# Patient Record
Sex: Female | Born: 1935 | Hispanic: Yes | Smoking: Never smoker
Health system: Southern US, Community
[De-identification: ages and names within clinical notes are randomized; demographics above are authoritative.]

## PROBLEM LIST (undated history)

## (undated) DIAGNOSIS — M67472 Ganglion, left ankle and foot: Secondary | ICD-10-CM

## (undated) DIAGNOSIS — N189 Chronic kidney disease, unspecified: Secondary | ICD-10-CM

## (undated) DIAGNOSIS — I509 Heart failure, unspecified: Secondary | ICD-10-CM

## (undated) DIAGNOSIS — F32A Depression, unspecified: Secondary | ICD-10-CM

## (undated) DIAGNOSIS — A4902 Methicillin resistant Staphylococcus aureus infection, unspecified site: Secondary | ICD-10-CM

## (undated) DIAGNOSIS — J45909 Unspecified asthma, uncomplicated: Secondary | ICD-10-CM

## (undated) DIAGNOSIS — I10 Essential (primary) hypertension: Secondary | ICD-10-CM

## (undated) DIAGNOSIS — F329 Major depressive disorder, single episode, unspecified: Secondary | ICD-10-CM

## (undated) DIAGNOSIS — M810 Age-related osteoporosis without current pathological fracture: Secondary | ICD-10-CM

## (undated) DIAGNOSIS — H9313 Tinnitus, bilateral: Secondary | ICD-10-CM

## (undated) DIAGNOSIS — M109 Gout, unspecified: Secondary | ICD-10-CM

## (undated) DIAGNOSIS — M1990 Unspecified osteoarthritis, unspecified site: Secondary | ICD-10-CM

## (undated) DIAGNOSIS — H9190 Unspecified hearing loss, unspecified ear: Secondary | ICD-10-CM

## (undated) DIAGNOSIS — E119 Type 2 diabetes mellitus without complications: Secondary | ICD-10-CM

## (undated) DIAGNOSIS — H9193 Unspecified hearing loss, bilateral: Secondary | ICD-10-CM

## (undated) HISTORY — DX: Heart failure, unspecified: I50.9

## (undated) HISTORY — DX: Gout, unspecified: M10.9

## (undated) HISTORY — PX: BACK SURGERY: SHX140

## (undated) HISTORY — DX: Methicillin resistant Staphylococcus aureus infection, unspecified site: A49.02

## (undated) HISTORY — DX: Unspecified asthma, uncomplicated: J45.909

## (undated) HISTORY — DX: Tinnitus, bilateral: H93.13

## (undated) HISTORY — DX: Unspecified hearing loss, bilateral: H91.93

## (undated) HISTORY — DX: Ganglion, left ankle and foot: M67.472

## (undated) HISTORY — PX: JOINT REPLACEMENT: SHX530

## (undated) HISTORY — DX: Unspecified hearing loss, unspecified ear: H91.90

## (undated) HISTORY — DX: Age-related osteoporosis without current pathological fracture: M81.0

## (undated) SURGERY — Surgical Case
Anesthesia: *Unknown

---

## 1997-08-18 ENCOUNTER — Other Ambulatory Visit: Admission: RE | Admit: 1997-08-18 | Discharge: 1997-08-18 | Payer: Self-pay | Admitting: Family Medicine

## 2000-01-01 ENCOUNTER — Encounter: Payer: Self-pay | Admitting: *Deleted

## 2000-01-01 ENCOUNTER — Ambulatory Visit (HOSPITAL_COMMUNITY): Admission: RE | Admit: 2000-01-01 | Discharge: 2000-01-01 | Payer: Self-pay | Admitting: *Deleted

## 2000-05-06 ENCOUNTER — Emergency Department (HOSPITAL_COMMUNITY): Admission: EM | Admit: 2000-05-06 | Discharge: 2000-05-07 | Payer: Self-pay | Admitting: Emergency Medicine

## 2000-05-07 ENCOUNTER — Encounter: Payer: Self-pay | Admitting: Internal Medicine

## 2000-05-07 ENCOUNTER — Inpatient Hospital Stay (HOSPITAL_COMMUNITY): Admission: EM | Admit: 2000-05-07 | Discharge: 2000-05-08 | Payer: Self-pay | Admitting: *Deleted

## 2000-06-09 ENCOUNTER — Encounter: Payer: Self-pay | Admitting: Family Medicine

## 2000-06-09 ENCOUNTER — Ambulatory Visit (HOSPITAL_COMMUNITY): Admission: RE | Admit: 2000-06-09 | Discharge: 2000-06-09 | Payer: Self-pay | Admitting: Family Medicine

## 2002-02-28 ENCOUNTER — Emergency Department (HOSPITAL_COMMUNITY): Admission: EM | Admit: 2002-02-28 | Discharge: 2002-03-01 | Payer: Self-pay | Admitting: Emergency Medicine

## 2002-02-28 ENCOUNTER — Encounter: Payer: Self-pay | Admitting: Emergency Medicine

## 2003-01-11 ENCOUNTER — Ambulatory Visit (HOSPITAL_COMMUNITY): Admission: RE | Admit: 2003-01-11 | Discharge: 2003-01-11 | Payer: Self-pay | Admitting: Internal Medicine

## 2003-07-20 ENCOUNTER — Ambulatory Visit (HOSPITAL_COMMUNITY): Admission: RE | Admit: 2003-07-20 | Discharge: 2003-07-20 | Payer: Self-pay | Admitting: Internal Medicine

## 2003-07-28 ENCOUNTER — Ambulatory Visit (HOSPITAL_COMMUNITY): Admission: RE | Admit: 2003-07-28 | Discharge: 2003-07-28 | Payer: Self-pay | Admitting: Internal Medicine

## 2003-11-22 ENCOUNTER — Ambulatory Visit: Payer: Self-pay | Admitting: *Deleted

## 2003-12-01 ENCOUNTER — Ambulatory Visit: Payer: Self-pay | Admitting: Family Medicine

## 2003-12-07 ENCOUNTER — Ambulatory Visit: Payer: Self-pay | Admitting: Family Medicine

## 2003-12-18 ENCOUNTER — Ambulatory Visit: Payer: Self-pay | Admitting: Family Medicine

## 2004-01-23 ENCOUNTER — Ambulatory Visit: Payer: Self-pay | Admitting: Family Medicine

## 2004-01-23 ENCOUNTER — Ambulatory Visit (HOSPITAL_COMMUNITY): Admission: RE | Admit: 2004-01-23 | Discharge: 2004-01-23 | Payer: Self-pay | Admitting: Family Medicine

## 2004-04-25 ENCOUNTER — Ambulatory Visit: Payer: Self-pay | Admitting: Family Medicine

## 2004-05-01 ENCOUNTER — Ambulatory Visit: Payer: Self-pay | Admitting: Family Medicine

## 2004-05-13 ENCOUNTER — Ambulatory Visit: Payer: Self-pay | Admitting: Family Medicine

## 2004-06-12 ENCOUNTER — Ambulatory Visit: Payer: Self-pay | Admitting: Family Medicine

## 2004-06-19 ENCOUNTER — Ambulatory Visit: Payer: Self-pay | Admitting: Family Medicine

## 2004-08-26 ENCOUNTER — Ambulatory Visit: Payer: Self-pay | Admitting: Internal Medicine

## 2004-11-15 ENCOUNTER — Ambulatory Visit: Payer: Self-pay | Admitting: Family Medicine

## 2004-12-19 ENCOUNTER — Ambulatory Visit: Payer: Self-pay | Admitting: Family Medicine

## 2004-12-30 ENCOUNTER — Ambulatory Visit: Payer: Self-pay | Admitting: Family Medicine

## 2005-01-10 ENCOUNTER — Ambulatory Visit: Payer: Self-pay | Admitting: Family Medicine

## 2005-06-30 ENCOUNTER — Ambulatory Visit: Payer: Self-pay | Admitting: Family Medicine

## 2005-07-01 ENCOUNTER — Ambulatory Visit: Payer: Self-pay | Admitting: Family Medicine

## 2005-07-02 ENCOUNTER — Ambulatory Visit (HOSPITAL_COMMUNITY): Admission: RE | Admit: 2005-07-02 | Discharge: 2005-07-02 | Payer: Self-pay | Admitting: Internal Medicine

## 2005-07-16 ENCOUNTER — Emergency Department (HOSPITAL_COMMUNITY): Admission: EM | Admit: 2005-07-16 | Discharge: 2005-07-16 | Payer: Self-pay | Admitting: Emergency Medicine

## 2005-07-17 ENCOUNTER — Ambulatory Visit: Payer: Self-pay | Admitting: Family Medicine

## 2005-07-28 ENCOUNTER — Ambulatory Visit (HOSPITAL_BASED_OUTPATIENT_CLINIC_OR_DEPARTMENT_OTHER): Admission: RE | Admit: 2005-07-28 | Discharge: 2005-07-28 | Payer: Self-pay | Admitting: Urology

## 2005-08-04 ENCOUNTER — Ambulatory Visit (HOSPITAL_COMMUNITY): Admission: RE | Admit: 2005-08-04 | Discharge: 2005-08-04 | Payer: Self-pay | Admitting: Urology

## 2005-08-11 ENCOUNTER — Ambulatory Visit (HOSPITAL_COMMUNITY): Admission: RE | Admit: 2005-08-11 | Discharge: 2005-08-11 | Payer: Self-pay | Admitting: Urology

## 2005-09-01 ENCOUNTER — Ambulatory Visit (HOSPITAL_BASED_OUTPATIENT_CLINIC_OR_DEPARTMENT_OTHER): Admission: RE | Admit: 2005-09-01 | Discharge: 2005-09-01 | Payer: Self-pay | Admitting: Urology

## 2005-09-16 ENCOUNTER — Ambulatory Visit (HOSPITAL_COMMUNITY): Admission: RE | Admit: 2005-09-16 | Discharge: 2005-09-16 | Payer: Self-pay | Admitting: Urology

## 2005-10-23 ENCOUNTER — Ambulatory Visit: Payer: Self-pay | Admitting: Family Medicine

## 2005-10-24 ENCOUNTER — Ambulatory Visit: Payer: Self-pay | Admitting: *Deleted

## 2005-11-19 ENCOUNTER — Ambulatory Visit: Payer: Self-pay | Admitting: Family Medicine

## 2005-12-11 ENCOUNTER — Ambulatory Visit: Payer: Self-pay | Admitting: Family Medicine

## 2006-01-20 ENCOUNTER — Ambulatory Visit: Payer: Self-pay | Admitting: Family Medicine

## 2006-03-03 ENCOUNTER — Ambulatory Visit: Payer: Self-pay | Admitting: Family Medicine

## 2006-04-22 ENCOUNTER — Ambulatory Visit: Payer: Self-pay | Admitting: Family Medicine

## 2006-05-19 ENCOUNTER — Ambulatory Visit: Payer: Self-pay | Admitting: Family Medicine

## 2006-05-19 ENCOUNTER — Encounter (INDEPENDENT_AMBULATORY_CARE_PROVIDER_SITE_OTHER): Payer: Self-pay | Admitting: Family Medicine

## 2006-07-02 ENCOUNTER — Ambulatory Visit (HOSPITAL_COMMUNITY): Admission: RE | Admit: 2006-07-02 | Discharge: 2006-07-02 | Payer: Self-pay | Admitting: Urology

## 2006-08-04 ENCOUNTER — Ambulatory Visit (HOSPITAL_COMMUNITY): Admission: RE | Admit: 2006-08-04 | Discharge: 2006-08-04 | Payer: Self-pay | Admitting: Urology

## 2006-08-17 ENCOUNTER — Encounter (INDEPENDENT_AMBULATORY_CARE_PROVIDER_SITE_OTHER): Payer: Self-pay | Admitting: Family Medicine

## 2006-08-17 DIAGNOSIS — K5730 Diverticulosis of large intestine without perforation or abscess without bleeding: Secondary | ICD-10-CM | POA: Insufficient documentation

## 2006-08-17 DIAGNOSIS — E1122 Type 2 diabetes mellitus with diabetic chronic kidney disease: Secondary | ICD-10-CM

## 2006-08-17 DIAGNOSIS — I10 Essential (primary) hypertension: Secondary | ICD-10-CM | POA: Insufficient documentation

## 2006-09-17 ENCOUNTER — Encounter (INDEPENDENT_AMBULATORY_CARE_PROVIDER_SITE_OTHER): Payer: Self-pay | Admitting: Nurse Practitioner

## 2006-09-17 ENCOUNTER — Ambulatory Visit: Payer: Self-pay | Admitting: Internal Medicine

## 2006-11-11 ENCOUNTER — Encounter (INDEPENDENT_AMBULATORY_CARE_PROVIDER_SITE_OTHER): Payer: Self-pay | Admitting: *Deleted

## 2007-04-27 ENCOUNTER — Ambulatory Visit: Payer: Self-pay | Admitting: Internal Medicine

## 2007-04-27 ENCOUNTER — Encounter (INDEPENDENT_AMBULATORY_CARE_PROVIDER_SITE_OTHER): Payer: Self-pay | Admitting: Family Medicine

## 2007-04-27 LAB — CONVERTED CEMR LAB
Alkaline Phosphatase: 80 units/L (ref 39–117)
Basophils Absolute: 0.1 10*3/uL (ref 0.0–0.1)
CO2: 24 meq/L (ref 19–32)
Cholesterol: 172 mg/dL (ref 0–200)
Creatinine, Ser: 0.86 mg/dL (ref 0.40–1.20)
Eosinophils Absolute: 0.1 10*3/uL (ref 0.0–0.7)
Eosinophils Relative: 2 % (ref 0–5)
Glucose, Bld: 155 mg/dL — ABNORMAL HIGH (ref 70–99)
HCT: 42 % (ref 36.0–46.0)
Hemoglobin: 13 g/dL (ref 12.0–15.0)
LDL Cholesterol: 53 mg/dL (ref 0–99)
Lymphs Abs: 2.1 10*3/uL (ref 0.7–4.0)
MCV: 88.6 fL (ref 78.0–100.0)
Monocytes Absolute: 0.5 10*3/uL (ref 0.1–1.0)
Platelets: 216 10*3/uL (ref 150–400)
RDW: 15.9 % — ABNORMAL HIGH (ref 11.5–15.5)
Sodium: 141 meq/L (ref 135–145)
Total Bilirubin: 0.3 mg/dL (ref 0.3–1.2)
Total CHOL/HDL Ratio: 3.8
Total Protein: 7.4 g/dL (ref 6.0–8.3)
Triglycerides: 368 mg/dL — ABNORMAL HIGH (ref <150)
VLDL: 74 mg/dL — ABNORMAL HIGH (ref 0–40)

## 2007-05-25 ENCOUNTER — Inpatient Hospital Stay (HOSPITAL_COMMUNITY): Admission: RE | Admit: 2007-05-25 | Discharge: 2007-05-29 | Payer: Self-pay | Admitting: Orthopaedic Surgery

## 2007-08-16 ENCOUNTER — Ambulatory Visit: Payer: Self-pay | Admitting: Family Medicine

## 2007-12-08 ENCOUNTER — Ambulatory Visit (HOSPITAL_COMMUNITY): Admission: RE | Admit: 2007-12-08 | Discharge: 2007-12-08 | Payer: Self-pay | Admitting: Urology

## 2007-12-14 ENCOUNTER — Ambulatory Visit: Payer: Self-pay | Admitting: Internal Medicine

## 2007-12-14 ENCOUNTER — Encounter (INDEPENDENT_AMBULATORY_CARE_PROVIDER_SITE_OTHER): Payer: Self-pay | Admitting: Adult Health

## 2007-12-14 LAB — CONVERTED CEMR LAB
ALT: 11 units/L (ref 0–35)
AST: 15 units/L (ref 0–37)
Albumin: 4 g/dL (ref 3.5–5.2)
Basophils Absolute: 0 10*3/uL (ref 0.0–0.1)
Basophils Relative: 1 % (ref 0–1)
CO2: 21 meq/L (ref 19–32)
Calcium: 9 mg/dL (ref 8.4–10.5)
Chloride: 106 meq/L (ref 96–112)
Cholesterol: 144 mg/dL (ref 0–200)
Creatinine, Ser: 0.89 mg/dL (ref 0.40–1.20)
Hemoglobin: 11.9 g/dL — ABNORMAL LOW (ref 12.0–15.0)
Lymphocytes Relative: 29 % (ref 12–46)
MCHC: 30.5 g/dL (ref 30.0–36.0)
Microalb, Ur: 1.96 mg/dL — ABNORMAL HIGH (ref 0.00–1.89)
Monocytes Absolute: 0.4 10*3/uL (ref 0.1–1.0)
Neutro Abs: 3.1 10*3/uL (ref 1.7–7.7)
Neutrophils Relative %: 60 % (ref 43–77)
Platelets: 220 10*3/uL (ref 150–400)
Potassium: 4.2 meq/L (ref 3.5–5.3)
RDW: 15.3 % (ref 11.5–15.5)
Sodium: 140 meq/L (ref 135–145)
TSH: 0.845 microintl units/mL (ref 0.350–4.50)
Total CHOL/HDL Ratio: 3
Total Protein: 7.2 g/dL (ref 6.0–8.3)

## 2007-12-15 ENCOUNTER — Encounter (INDEPENDENT_AMBULATORY_CARE_PROVIDER_SITE_OTHER): Payer: Self-pay | Admitting: Adult Health

## 2007-12-17 ENCOUNTER — Ambulatory Visit: Payer: Self-pay | Admitting: Internal Medicine

## 2007-12-29 ENCOUNTER — Ambulatory Visit: Payer: Self-pay | Admitting: Internal Medicine

## 2007-12-29 ENCOUNTER — Encounter (INDEPENDENT_AMBULATORY_CARE_PROVIDER_SITE_OTHER): Payer: Self-pay | Admitting: Adult Health

## 2007-12-31 ENCOUNTER — Ambulatory Visit (HOSPITAL_COMMUNITY): Admission: RE | Admit: 2007-12-31 | Discharge: 2007-12-31 | Payer: Self-pay | Admitting: Family Medicine

## 2008-05-02 ENCOUNTER — Ambulatory Visit: Payer: Self-pay | Admitting: Internal Medicine

## 2008-07-27 ENCOUNTER — Ambulatory Visit: Payer: Self-pay | Admitting: Internal Medicine

## 2008-09-26 ENCOUNTER — Encounter (INDEPENDENT_AMBULATORY_CARE_PROVIDER_SITE_OTHER): Payer: Self-pay | Admitting: Adult Health

## 2008-09-26 ENCOUNTER — Ambulatory Visit (HOSPITAL_COMMUNITY): Admission: RE | Admit: 2008-09-26 | Discharge: 2008-09-26 | Payer: Self-pay | Admitting: Internal Medicine

## 2008-09-26 ENCOUNTER — Ambulatory Visit: Payer: Self-pay | Admitting: Internal Medicine

## 2008-09-26 LAB — CONVERTED CEMR LAB: Microalb, Ur: 1.5 mg/dL (ref 0.00–1.89)

## 2008-09-27 ENCOUNTER — Encounter (INDEPENDENT_AMBULATORY_CARE_PROVIDER_SITE_OTHER): Payer: Self-pay | Admitting: Adult Health

## 2008-10-09 ENCOUNTER — Ambulatory Visit (HOSPITAL_COMMUNITY): Admission: RE | Admit: 2008-10-09 | Discharge: 2008-10-09 | Payer: Self-pay | Admitting: Internal Medicine

## 2008-10-24 ENCOUNTER — Ambulatory Visit: Payer: Self-pay | Admitting: Internal Medicine

## 2008-11-23 ENCOUNTER — Ambulatory Visit: Payer: Self-pay | Admitting: Internal Medicine

## 2008-11-23 ENCOUNTER — Encounter (INDEPENDENT_AMBULATORY_CARE_PROVIDER_SITE_OTHER): Payer: Self-pay | Admitting: Adult Health

## 2008-11-23 LAB — CONVERTED CEMR LAB
BUN: 13 mg/dL (ref 6–23)
Chloride: 107 meq/L (ref 96–112)
Creatinine, Ser: 0.96 mg/dL (ref 0.40–1.20)
Potassium: 4.7 meq/L (ref 3.5–5.3)

## 2008-12-25 ENCOUNTER — Encounter (INDEPENDENT_AMBULATORY_CARE_PROVIDER_SITE_OTHER): Payer: Self-pay | Admitting: Adult Health

## 2008-12-25 ENCOUNTER — Ambulatory Visit: Payer: Self-pay | Admitting: Internal Medicine

## 2008-12-25 LAB — CONVERTED CEMR LAB
Chloride: 105 meq/L (ref 96–112)
Glucose, Bld: 120 mg/dL — ABNORMAL HIGH (ref 70–99)
Microalb, Ur: 0.98 mg/dL (ref 0.00–1.89)
Potassium: 4.9 meq/L (ref 3.5–5.3)
Sodium: 136 meq/L (ref 135–145)

## 2008-12-28 ENCOUNTER — Ambulatory Visit: Payer: Self-pay | Admitting: Internal Medicine

## 2009-01-01 ENCOUNTER — Ambulatory Visit: Payer: Self-pay | Admitting: Internal Medicine

## 2009-01-01 ENCOUNTER — Ambulatory Visit (HOSPITAL_COMMUNITY): Admission: RE | Admit: 2009-01-01 | Discharge: 2009-01-01 | Payer: Self-pay | Admitting: Obstetrics & Gynecology

## 2009-04-11 ENCOUNTER — Ambulatory Visit: Payer: Self-pay | Admitting: Internal Medicine

## 2009-04-26 ENCOUNTER — Encounter (INDEPENDENT_AMBULATORY_CARE_PROVIDER_SITE_OTHER): Payer: Self-pay | Admitting: Adult Health

## 2009-04-26 ENCOUNTER — Ambulatory Visit: Payer: Self-pay | Admitting: Internal Medicine

## 2009-04-26 LAB — CONVERTED CEMR LAB
ALT: 16 units/L (ref 0–35)
CO2: 19 meq/L (ref 19–32)
Calcium: 9 mg/dL (ref 8.4–10.5)
Chloride: 101 meq/L (ref 96–112)
Cholesterol: 186 mg/dL (ref 0–200)
Glucose, Bld: 142 mg/dL — ABNORMAL HIGH (ref 70–99)
Sodium: 132 meq/L — ABNORMAL LOW (ref 135–145)
Total Protein: 7.2 g/dL (ref 6.0–8.3)
Triglycerides: 250 mg/dL — ABNORMAL HIGH (ref <150)

## 2009-05-22 ENCOUNTER — Inpatient Hospital Stay (HOSPITAL_COMMUNITY): Admission: RE | Admit: 2009-05-22 | Discharge: 2009-05-25 | Payer: Self-pay | Admitting: Orthopaedic Surgery

## 2009-07-09 ENCOUNTER — Ambulatory Visit: Payer: Self-pay | Admitting: Internal Medicine

## 2009-07-31 ENCOUNTER — Ambulatory Visit: Payer: Self-pay | Admitting: Internal Medicine

## 2009-08-01 ENCOUNTER — Encounter (INDEPENDENT_AMBULATORY_CARE_PROVIDER_SITE_OTHER): Payer: Self-pay | Admitting: Adult Health

## 2009-08-28 ENCOUNTER — Ambulatory Visit: Payer: Self-pay | Admitting: Internal Medicine

## 2009-08-29 ENCOUNTER — Ambulatory Visit: Payer: Self-pay | Admitting: Family Medicine

## 2009-08-29 ENCOUNTER — Encounter (INDEPENDENT_AMBULATORY_CARE_PROVIDER_SITE_OTHER): Payer: Self-pay | Admitting: Adult Health

## 2009-08-29 LAB — CONVERTED CEMR LAB
Albumin: 4.2 g/dL (ref 3.5–5.2)
BUN: 17 mg/dL (ref 6–23)
CO2: 24 meq/L (ref 19–32)
Calcium: 9.4 mg/dL (ref 8.4–10.5)
Chloride: 99 meq/L (ref 96–112)
Cholesterol: 125 mg/dL (ref 0–200)
Glucose, Bld: 156 mg/dL — ABNORMAL HIGH (ref 70–99)
HDL: 52 mg/dL (ref >39)
Potassium: 4.9 meq/L (ref 3.5–5.3)
Triglycerides: 108 mg/dL (ref <150)

## 2009-12-24 ENCOUNTER — Encounter (INDEPENDENT_AMBULATORY_CARE_PROVIDER_SITE_OTHER): Payer: Self-pay | Admitting: *Deleted

## 2009-12-24 LAB — CONVERTED CEMR LAB: Microalb, Ur: 0.58 mg/dL (ref 0.00–1.89)

## 2010-01-02 ENCOUNTER — Ambulatory Visit (HOSPITAL_COMMUNITY): Admission: RE | Admit: 2010-01-02 | Discharge: 2010-01-02 | Payer: Self-pay | Admitting: Internal Medicine

## 2010-03-16 ENCOUNTER — Encounter: Payer: Self-pay | Admitting: Internal Medicine

## 2010-05-06 ENCOUNTER — Other Ambulatory Visit (HOSPITAL_COMMUNITY): Payer: Self-pay | Admitting: Family Medicine

## 2010-05-06 ENCOUNTER — Ambulatory Visit (HOSPITAL_COMMUNITY)
Admission: RE | Admit: 2010-05-06 | Discharge: 2010-05-06 | Disposition: A | Discharge status: Home / Self Care | Payer: Self-pay | Source: Ambulatory Visit | Attending: Family Medicine | Admitting: Family Medicine

## 2010-05-06 ENCOUNTER — Encounter (INDEPENDENT_AMBULATORY_CARE_PROVIDER_SITE_OTHER): Payer: Self-pay | Admitting: *Deleted

## 2010-05-06 DIAGNOSIS — R52 Pain, unspecified: Secondary | ICD-10-CM

## 2010-05-06 DIAGNOSIS — M899 Disorder of bone, unspecified: Secondary | ICD-10-CM | POA: Insufficient documentation

## 2010-05-06 DIAGNOSIS — M25579 Pain in unspecified ankle and joints of unspecified foot: Secondary | ICD-10-CM | POA: Insufficient documentation

## 2010-05-06 LAB — CONVERTED CEMR LAB
Albumin: 4.2 g/dL (ref 3.5–5.2)
Alkaline Phosphatase: 88 units/L (ref 39–117)
BUN: 19 mg/dL (ref 6–23)
Calcium: 9.7 mg/dL (ref 8.4–10.5)
Chloride: 103 meq/L (ref 96–112)
Glucose, Bld: 223 mg/dL — ABNORMAL HIGH (ref 70–99)
LDL Cholesterol: 50 mg/dL (ref 0–99)
Potassium: 5.5 meq/L — ABNORMAL HIGH (ref 3.5–5.3)
Sodium: 137 meq/L (ref 135–145)
Total Protein: 7.1 g/dL (ref 6.0–8.3)
Triglycerides: 149 mg/dL (ref <150)

## 2010-05-15 LAB — GLUCOSE, CAPILLARY
Glucose-Capillary: 158 mg/dL — ABNORMAL HIGH (ref 70–99)
Glucose-Capillary: 162 mg/dL — ABNORMAL HIGH (ref 70–99)

## 2010-05-15 LAB — CBC
HCT: 31.2 % — ABNORMAL LOW (ref 36.0–46.0)
MCHC: 34.5 g/dL (ref 30.0–36.0)
MCV: 87.9 fL (ref 78.0–100.0)
RBC: 3.55 MIL/uL — ABNORMAL LOW (ref 3.87–5.11)
WBC: 8.4 10*3/uL (ref 4.0–10.5)

## 2010-05-15 LAB — BASIC METABOLIC PANEL
CO2: 25 mEq/L (ref 19–32)
Chloride: 102 mEq/L (ref 96–112)
GFR calc Af Amer: >60 mL/min (ref >60)
Potassium: 4.3 mEq/L (ref 3.5–5.1)

## 2010-05-17 LAB — CROSSMATCH
ABO/RH(D): O NEG
Antibody Screen: NEGATIVE

## 2010-05-17 LAB — PROTIME-INR: INR: 1.07 (ref 0.00–1.49)

## 2010-05-17 LAB — CBC
HCT: 27.3 % — ABNORMAL LOW (ref 36.0–46.0)
HCT: 31 % — ABNORMAL LOW (ref 36.0–46.0)
HCT: 35.9 % — ABNORMAL LOW (ref 36.0–46.0)
Hemoglobin: 10.6 g/dL — ABNORMAL LOW (ref 12.0–15.0)
MCHC: 34 g/dL (ref 30.0–36.0)
MCV: 86.2 fL (ref 78.0–100.0)
MCV: 87.9 fL (ref 78.0–100.0)
Platelets: 236 10*3/uL (ref 150–400)
Platelets: 241 10*3/uL (ref 150–400)
RBC: 3.53 MIL/uL — ABNORMAL LOW (ref 3.87–5.11)
RDW: 14.4 % (ref 11.5–15.5)
WBC: 7.7 10*3/uL (ref 4.0–10.5)
WBC: 8.7 10*3/uL (ref 4.0–10.5)
WBC: 9.4 10*3/uL (ref 4.0–10.5)

## 2010-05-17 LAB — URINE MICROSCOPIC-ADD ON

## 2010-05-17 LAB — BASIC METABOLIC PANEL
BUN: 16 mg/dL (ref 6–23)
BUN: 17 mg/dL (ref 6–23)
BUN: 17 mg/dL (ref 6–23)
CO2: 22 mEq/L (ref 19–32)
CO2: 24 mEq/L (ref 19–32)
Chloride: 96 mEq/L (ref 96–112)
Chloride: 97 mEq/L (ref 96–112)
Chloride: 99 mEq/L (ref 96–112)
Creatinine, Ser: 1.87 mg/dL — ABNORMAL HIGH (ref 0.4–1.2)
GFR calc Af Amer: 32 mL/min — ABNORMAL LOW (ref >60)
GFR calc Af Amer: >60 mL/min (ref >60)
GFR calc non Af Amer: 42 mL/min — ABNORMAL LOW (ref >60)
GFR calc non Af Amer: 55 mL/min — ABNORMAL LOW (ref >60)
Glucose, Bld: 150 mg/dL — ABNORMAL HIGH (ref 70–99)
Glucose, Bld: 242 mg/dL — ABNORMAL HIGH (ref 70–99)
Potassium: 4.2 mEq/L (ref 3.5–5.1)
Potassium: 4.4 mEq/L (ref 3.5–5.1)
Potassium: 4.5 mEq/L (ref 3.5–5.1)
Potassium: 4.9 mEq/L (ref 3.5–5.1)
Sodium: 126 mEq/L — ABNORMAL LOW (ref 135–145)
Sodium: 126 mEq/L — ABNORMAL LOW (ref 135–145)

## 2010-05-17 LAB — URINALYSIS, ROUTINE W REFLEX MICROSCOPIC
Bilirubin Urine: NEGATIVE
Hgb urine dipstick: NEGATIVE
Specific Gravity, Urine: 1.009 (ref 1.005–1.030)
pH: 6 (ref 5.0–8.0)

## 2010-05-17 LAB — HEPATIC FUNCTION PANEL
ALT: 13 U/L (ref 0–35)
AST: 22 U/L (ref 0–37)
Bilirubin, Direct: <0.1 mg/dL (ref 0.0–0.3)
Total Protein: 7.4 g/dL (ref 6.0–8.3)

## 2010-05-17 LAB — GLUCOSE, CAPILLARY
Glucose-Capillary: 165 mg/dL — ABNORMAL HIGH (ref 70–99)
Glucose-Capillary: 179 mg/dL — ABNORMAL HIGH (ref 70–99)
Glucose-Capillary: 200 mg/dL — ABNORMAL HIGH (ref 70–99)

## 2010-05-17 LAB — ABO/RH: ABO/RH(D): O NEG

## 2010-05-24 ENCOUNTER — Encounter (INDEPENDENT_AMBULATORY_CARE_PROVIDER_SITE_OTHER): Payer: Self-pay | Admitting: *Deleted

## 2010-05-24 LAB — CONVERTED CEMR LAB
CO2: 19 meq/L (ref 19–32)
Calcium: 9.4 mg/dL (ref 8.4–10.5)
Glucose, Bld: 53 mg/dL — ABNORMAL LOW (ref 70–99)
Potassium: 5.2 meq/L (ref 3.5–5.3)
Sodium: 133 meq/L — ABNORMAL LOW (ref 135–145)

## 2010-07-06 ENCOUNTER — Inpatient Hospital Stay (INDEPENDENT_AMBULATORY_CARE_PROVIDER_SITE_OTHER)
Admission: RE | Admit: 2010-07-06 | Discharge: 2010-07-06 | Disposition: A | Discharge status: Home / Self Care | Payer: Self-pay | Source: Ambulatory Visit | Attending: Emergency Medicine | Admitting: Emergency Medicine

## 2010-07-06 ENCOUNTER — Ambulatory Visit (INDEPENDENT_AMBULATORY_CARE_PROVIDER_SITE_OTHER): Payer: Self-pay

## 2010-07-06 DIAGNOSIS — M25559 Pain in unspecified hip: Secondary | ICD-10-CM

## 2010-07-09 NOTE — Op Note (Signed)
NAME:  Sumner Boast      ACCOUNT NO.:  [id number]   MEDICAL RECORD NO.:  [id number]          PATIENT TYPE:  INP   LOCATION:  2899                         FACILITY:  [facility]   PHYSICIAN:  Vanita Panda. Magnus Ivan, M.D.DATE OF BIRTH:  Aug 25, 1934   DATE OF PROCEDURE:  05/25/2007  DATE OF DISCHARGE:                               OPERATIVE REPORT   PREOPERATIVE DIAGNOSIS:  Right knee severe osteoarthritis/degenerative  joint disease.   POSTOPERATIVE DIAGNOSIS:  Right knee severe osteoarthritis/degenerative  joint disease.   PROCEDURE:  Right total knee arthroplasty using computer navigation.   IMPLANTS:  DePuy rotating platform [employer] total knee  replacement with size 2 femoral component, size 2 tibial tray, size 12.5  polyethylene insert, 32 mm polyethylene patellar button.   SURGEON:  Vanita Panda. Magnus Ivan, M.D.   ASSISTANT:  Wende Neighbors, P.A.-C.   ANESTHESIA:  1. Right leg femoral nerve block.  2. General.   ANTIBIOTICS:  1 gram IV Ancef.   TOURNIQUET TIME:  1 hour 30 minutes.   ESTIMATED BLOOD LOSS:  150 mL.   COMPLICATIONS:  None.   INDICATIONS:  Briefly, Ms. [name] is a 76 year old female who I have  been following for over a year with severe pain in both her knees as  well as her lower back.  We have had well documented radiograph evidence  of tricompartmental arthritis involving both knees and her right knee  has been much more painful to her.  We have tried injections in her  knees and anti-inflammatories and it has gotten to the point where it is  greatly affecting her activities of daily living and she is wishing to  proceed to total knee arthroplasty due to the amount of pain she is  having on a daily basis.  The risks and benefits of this have been  explained to her at length through interpreters and through her family  and they agreed to proceed with surgery.  I have shown them models of  a  total knee replacement, explained the  risks and benefits including the  risks of DVT and fatal pulmonary embolus.  They wish to proceed with  surgery.   DESCRIPTION OF PROCEDURE:  After informed consent was obtained, the  right leg was marked and a femoral block was obtained by anesthesia.  She was then brought to the operating room and placed supine on the  operating table.  General anesthesia was then obtained. A non-sterile  tourniquet was placed on her upper right thigh, a Foley catheter was  inserted, as well.  Her leg was prepped and draped with DuraPrep and  sterile drapes including a sterile stockinette.  A time out was called  and she was identified as the correct patient and the correct extremity.  Then, an Esmarch was used to wrap out her the leg and the tourniquet was  inflated to 300 mmHg.  I then made a midline incision directly over the  patella and carried this proximally and distally down to the level of  the tibial tubercle and proximal.  I then took a medial parapatellar  arthrotomy.  A large effusion was encountered.  I  dissected down to the  knee joint.  I then was able to invert the patella and brought the knee  up to the flexed position.  Osteophytes were removed using a rongeur on  both the tibia, the patella, and the femur.  I then released the ACL as  well as the PCL and removed meniscal debris from the medial and lateral  compartments.  I next proceeded with the navigation portion of the case.  Just distal to my incision, two small stab incisions were made in the  tibia and I placed Steinmann pins from anterior medial to posterior  lateral and placed navigation arrays for the navigation portion of the  case.  Two similar pins were placed in the femur proximally from  anterior medial to posterior lateral but through my arthrotomy incision.  I then used the [facility] computer system to help navigate the knee.  First, I then was able to make my tibial cut with taking 10 mm off the  high side and only  4-5 mm off the low side and she had a significant  varus deformity of her knee with a measured angulation of between 11 and  14 degrees.  Once the tibia cut was made, this was verified under  computer navigation and this was within accordance of our plan for  balancing.  I then balanced the knee in extension and brought it to a  flexed position and this maintained her balance, as well.  I then made  my distal femoral cut using the computer navigation guide and then  balanced this again in flexion and extension. Once I felt this was  adequate and released more tissue from the medial side, this started  coming to a more balanced knee.  I then made the chamfer cuts using the  soft tissue guide as well as cutting off the rotation.  This all, again,  was verified with the computer navigation.  I then chose a size 2  femoral component that was preselected, as well, and sized the tibial  tray and prepared my box cut and the tibial cut for the finishing cuts.  I then placed a size 10 mm insert and put the knee through a range of  motion, a 12.5 mm trial insert, I felt the 12.5 mm insert gave her more  stability with giving her full extension, as well, I could flex her  easily to 120 degrees.  I next measured the patella, only 18 mm, so I  took 5 mm off my patellar cut, and drilled for a size 32 patellar  button.  All trial components were then removed.  I copiously irrigated  the knee using normal saline solution and pulsatile lavage. The cement  was then mixed and I cemented the real size 2 rotating platform tibial  tray followed by the size 2 femur.  The 12.5 mm polyethylene insert was  placed and the patellar button was also secured which was a size 32.  Once the cement was dry, I put the knee through a range of motion and,  again, it was felt to be stable with full extension to 100 degrees of  flexion.  She is a very thin individual.  I then copiously irrigated the  knee, again, after the  tourniquet was let down.  Hemostasis was  obtained.  There was enough of a dry knee that I felt it was not  necessary to place a drain at that point.  I then closed the arthrotomy  with interrupted #1 Vicryl suture followed by 2-0 Vicryl suture to bring  the skin together and a subcuticular 4-0 Vicryl suture with Steri-  Strips.  The navigation pins were removed from both the femur and the  tibia, as well.  A well padded sterile dressing was applied.  When the  tourniquet was let down again hemostasis was obtained and the tourniquet  was only up for 1 hour 33 minutes.  The toes were nice and pink when we  were done with the case.  The patient was awakened, extubated, and taken  to the recovery room in stable condition.      Vanita Panda. Magnus Ivan, M.D.  Electronically Signed     CYB/MEDQ  D:  05/25/2007  T:  05/25/2007  Job:  [id number]

## 2010-07-12 NOTE — Discharge Summary (Signed)
NAME:  Joyce Gilmore      ACCOUNT NO.:  [id number]   MEDICAL RECORD NO.:  [id number]          PATIENT TYPE:  INP   LOCATION:  5009                         FACILITY:  [facility]   PHYSICIAN:  Vanita Panda. Magnus Ivan, M.D.DATE OF BIRTH:  10-Feb-1935   DATE OF ADMISSION:  05/25/2007  DATE OF DISCHARGE:  05/29/2007                               DISCHARGE SUMMARY   ADMITTING DIAGNOSIS:  Right knee severe degenerative joint  disease/osteoarthritis.   DISCHARGE DIAGNOSIS:  Right knee severe degenerative joint  disease/osteoarthritis.   PROCEDURES:  Right total knee arthroplasty on May 25, 2007.   HOSPITAL COURSE:  The patient, Joyce Gilmore, is a 76 year old  Hispanic non-English-speaking female with severe arthritis involving her  right knee.  I have followed her for some time and provided injections  in her knee.  She has bone-on-bone very well documented in x-rays and  she got to the point where the pain was so bad that this was impeding  her activities of daily living.  Her family talked to me at length about  the possibility of a total knee replacement given that she is still  quite active individual.  I explained the risks and benefits of this to  them, and they agreed to proceed with surgery.   Joyce Gilmore was brought to the operating room on the day of  admission, and she underwent the aforementioned total right total knee  replacement without complications.  For detailed description of the  operation, please see the dictated operative report in the patient's  medical record.  Postoperatively, she was admitted to regular floor  orthopedic bed and began working per protocol with weightbearing as  tolerated and a CPM on her right knee.  She progressed well through her  hospital stay with no complications.  By the day of discharge, she was  ambulating with physical therapy assistance.  She was tolerating oral  pain medications as well as a regular diet.  Incision  was found to be  clean, dry, and intact.  She was increasing her mobility as well as her  motion of her knee.  It was felt that she could be discharged safely to  home.   DISPOSITION:  To home.   DISCHARGE INSTRUCTIONS:  While she is at home, she will have advanced  home care coming to the home to work with the continued physical  therapy.  INR will be checked on a biweekly basis while she is on  Coumadin.  She will continue all her same home medications that she was  on prior to admission.  Followup appointment will be established in the  office in 2 weeks after discharge.   DISCHARGE MEDICATIONS:  1. Metformin.  2. Glucotrol.  3. Amitriptyline.  4. Lisinopril.  5. Avandia.  6. Percocet.  7. Robaxin.  8. Coumadin.      Vanita Panda. Magnus Ivan, M.D.  Electronically Signed     CYB/MEDQ  D:  06/22/2007  T:  06/23/2007  Job:  [id number]

## 2010-07-12 NOTE — Op Note (Signed)
NAMEMerryl Gilmore              ACCOUNT NO.:  [id number]   MEDICAL RECORD NO.:  [id number]          PATIENT TYPE:  AMB   LOCATION:  [facility]                         FACILITY:  [facility]   PHYSICIAN:  Valetta Fuller, M.D.  DATE OF BIRTH:  Jul 26, 1934   DATE OF PROCEDURE:  07/28/2005  DATE OF DISCHARGE:                                 OPERATIVE REPORT   PREOPERATIVE DIAGNOSIS:  1.  A right renal calculus.  2.  Chronic right hydronephrosis.  3.  Right flank pain.   POSTOPERATIVE DIAGNOSIS:  1.  A right renal calculus.  2.  Chronic right hydronephrosis.  3.  Right flank pain.   PROCEDURE PERFORMED:  Cystoscopy, right retrograde pyelography, right double-  J stent placement.   SURGEON:  Dr. Isabel Caprice   ANESTHESIA:  General.   INDICATIONS:  Joyce Gilmore is a 76 year old female.  She is from [place].  She apparently has had nephrolithiasis in the past and may have had a  longstanding stone in her right kidney.  She has had some chronic problems  of the right-sided abdominal and flank pain.  She was assessed in [place] and  told that she had a large stone and may have a poorly functioning kidney.  We have no records whatsoever.  She came this country recently to be with  her family.  Because of ongoing pain she was brought to the emergency room  at [facility].  There a large stone was noted in a renal pelvis  with what appeared to be chronic hydronephrosis and some thinning of her  parenchyma.  We felt that the prudent course of action was initial placement  of a double-J stent to unobstruct her kidney.  We felt that she would need a  renal scan to assess renal function and if her kidney was salvageable to  probably go ahead with lithotripsy as an initial treatment option for her.  She does not speak English but her family was there who did speak [place]  and they appeared to understand these recommendations.  The patient is a  [facility] patient.  She is told that we did need to  try to salvage the  situation and if the kidney stone was not treated that the kidney function  as likely to deteriorate to the point the kidney is nonfunctioning.   DESCRIPTION OF PROCEDURE:  The patient was brought to the operating room  where she had successful induction of general anesthesia.  She was placed  lithotomy position, prepped, draped in usual manner.  Cystoscopy was  unremarkable.   Attention was turned retrograde pyelography.  A 8-French cone-tip catheter  was utilized.  The patient had normal caliber ureter without obstruction or  filling defects.  In the renal pelvis was a large filling defect causing  high-grade obstruction and the patient had significantly dilated caliceal  system.  The guidewire was placed past the stone into an upper pole calix.   Attention was then turned a double-J stent placement.  A 6-French 24 cm  Polaris stent was placed.  The dangle string was removed.  Good position was  confirmed with fluoroscopic as well as direct visual guidance.  The patient  appeared to tolerate the procedure well with no obvious complications or  difficulties.           ______________________________  Valetta Fuller, M.D.  Electronically Signed     DSG/MEDQ  D:  07/28/2005  T:  07/29/2005  Job:  [id number]

## 2010-07-12 NOTE — Discharge Summary (Signed)
[facility]  Patient:    Joyce Gilmore                       MRN: [id number] Adm. Date:  [id number] Disc. Date: 05/08/00 Attending:  Miguel Aschoff CC:         [facility]   Discharge Summary  CONSULTATIONS:  None.  PROCEDURES:  None.  DIAGNOSES:  1. Pyelonephritis septicemia. 2. Diabetes mellitus type 2, uncontrolled in the setting of acute infection.  DISCHARGE MEDICATIONS: 1. Ciprofloxacin 500 mg p.o. b.i.d. x 13 days. 2. Glucotrol XL 10 mg p.o. q.d.  DISCHARGE INSTRUCTIONS:  The patient is advised through a translator to drink plenty of fluids, eat a bland diet, monitor CBGs q.a.c. and record for presentation to physician at follow up.  DISCHARGE FOLLOW UP:  [facility], May 13, 2000 10:00 a.m. Wednesday.  SUMMARY:  [name] is a 76 year old Hispanic woman who presented with an acute onset the morning of admission with a pounding headache associated with dizziness on standing, and instability when upright.  She additionally had one episode of vomiting, acute onset of right-sided flank pain without radiation, fevers and shaking chills.  She has never been diagnosed with a kidney infection.  She presented to the emergency room for care, and Dr. Narda Amber on call for unassigned care, was requested to evaluate the patient.  HOSPITAL COURSE: #1 - PYELONEPHRITIS SEPTICEMIA:  [name] is noted to be hypotensive with temperature 101.5 and blood pressure 85/50, heart rate 110 on arrival with 96% room air oxygenation.  She looked acutely ill and exam was notable for normal abdomen, but marked CVA tenderness at the right flank.  White count was initially low at 4.3, rising the following day to 12.5.  Her renal functions was normal with BUN 15 and creatinine 3.9.  Notable was a hyperchloremic metabolic acidosis non-gap consistent with renal tubal acidosis.  She was placed on IV ciprofloxacin  and aggressive hydration and symptomatic medications were provided on a p.r.n. basis.  The following day, [name] has noticed marked improvement.  Her fever resolved, blood pressure normalized to 122/64 and pulse normalized to the 70s to 80s.  She was able to consume a diet without nausea or vomiting and exhibited no orthostatic symptoms during ambulation.  On examination her CVA tenderness had resolved.  Repeat laboratory showed BUN 18, creatinine 0.7, chloride 117, bicarbonate 19, sodium 140, potassium 3.5.  Urine culture indicated greater than 100,000 colonies of E. coli with sensitivities pending. KUB indicates right renal calcifications, but no obstruction.  She will be treated for a full two weeks with ciprofloxacin which she will obtain through the [facility].  Since she is being discharged Friday evening, enough medication to last through the weekend will be provided.  Detailed instructions regarding symptom monitoring, diet and fluid intake, monitoring of blood sugars and follow up were discussed.  Return if they are present.  #2 - TYPE 2 DIABETES MELLITUS WITH EXACERBATED HYPERGLYCEMIA IN SETTING OF INFECTION:  [name] received sliding scale insulin in addition to her normal Glucotrol dosing.  Her CPKs have been in the high 100s, low 200s.  She has early follow up and her CBGs should improve quickly as infection is controlled.  DD:  05/08/00 TD:  05/08/00 Job: [id number]

## 2010-07-12 NOTE — Op Note (Signed)
NAMEMerryl Gilmore              ACCOUNT NO.:  [id number]   MEDICAL RECORD NO.:  [id number]          PATIENT TYPE:  AMB   LOCATION:  NESC                         FACILITY:  [facility]   PHYSICIAN:  Valetta Fuller, M.D.  DATE OF BIRTH:  12-17-1934   DATE OF PROCEDURE:  09/01/2005  DATE OF DISCHARGE:                                 OPERATIVE REPORT   PREOPERATIVE DIAGNOSES:  1.  Right renal calculus.  2.  His chronic right hydronephrosis.  3.  Poorly functioning right kidney.   POSTOPERATIVE DIAGNOSES:  1.  Right renal calculus.  2.  His chronic right hydronephrosis.  3.  Poorly functioning right kidney.   PROCEDURE PERFORMED:  Cystoscopy, removal right double-J stent, rigid and  flexible ureteroscopy, holmium laser lithotripsy, basketing of stone  fragments, replacement of double-J stent.   SURGEON:  Valetta Fuller, M.D.   ANESTHESIA:  General.   INDICATIONS:  Joyce Gilmore is a 76 year old Hispanic female.  She speaks no  [place] but has had extensive consultations with a Nurse, learning disability.  It appears  she has had a very longstanding right renal stone and has been assessed in  both [place] as well as [place] for this.  It does not appear that she ever  had any treatment for this stone.  Again, it appears that it has been  relatively longstanding.  The patient eventually had come to [facility] to  be with some family.  Because of her chronic pain in her right kidney she  went to the [facility] emergency room where a 1 x 2 cm stone was noted in her  right renal pelvis really imbedded in her ureteropelvic junction.  She had  significant hydronephrosis with parenchymal loss.  We initiated her  procedure by placement of a double-J stent.  A renal scan was then performed  which did show significant loss of renal function on the right with the  right kidney provided only about 23% of the overall renal function.  We felt  that given her hypertension and diabetes, however, that it was probably  worth attempting to salvage that kidney.  The patient underwent initial  attempt at treatment with lithotripsy.  This fractured the stone into two  large fragments which did not appear to be passable.  For that reason she  presents now for an attempt at more definitive management.   TECHNIQUE AND FINDINGS:  The patient was brought to the operating room where  she had successful induction of general anesthesia.  She also received  perioperative antibiotics.  Cystoscopy revealed the stent to be in  relatively good position, with a fair amount of trigonal edema.  The stent  was partially removed and a guidewire was placed up to the renal pelvis.  We  initiated our attempts at treating her with a rigid ureteroscope.  As we got  up to the proximal ureter, we found the mucosa be quite friable and  edematous.  It was consistent with a longstanding obstruction in this area  along with a recent lithotripsy.  We saw that the wire and actually gone  submucosal near the proximal  ureter and therefore it was repositioned.  Because of some tortuosity of her ureter, I was really unable to access the  stone with the rigid ureteroscope.  For that reason, it was removed and an  access sheath was placed.  Flexible ureteroscope was then utilized.  We  found a large fragment imbedded really partly in her proximal ureter.  Holmium laser was used to fracture the stone into innumerable pieces, many  of which, migrated to her renal pelvis.  We basket extracted a multitude of  fragments and then found an additional piece that was about 5 mm in size  which was then also subjected to holmium laser lithotripsy.  As many of the  fragments as we could, were removed.  At that point, visualization became  more difficult because some ongoing oozing.  We felt we had significantly  reduced her stone burden but did feel there probably were some small  fragments remaining.  Once the guidewire was confirmed to be in good   position, a new double-J stent was placed.  The patient appeared to tolerate  the procedure well. There were no obvious complications or difficulties.           ______________________________  Valetta Fuller, M.D.  Electronically Signed     DSG/MEDQ  D:  09/02/2005  T:  09/02/2005  Job:  [id number]

## 2010-09-25 LAB — HM PAP SMEAR

## 2010-09-25 LAB — HM MAMMOGRAPHY

## 2010-11-18 LAB — BASIC METABOLIC PANEL
BUN: 14
CO2: 26
Chloride: 105
Creatinine, Ser: 0.9
Potassium: 4.5

## 2010-11-18 LAB — CBC
HCT: 37.4
MCHC: 34.3
MCV: 85
Platelets: 180
RBC: 4.4
WBC: 6.6

## 2010-11-19 LAB — BASIC METABOLIC PANEL
BUN: 11
BUN: 18
CO2: 20
Calcium: 8.5
Calcium: 8.6
Chloride: 103
Creatinine, Ser: 1.07
Creatinine, Ser: 1.32 — ABNORMAL HIGH
GFR calc Af Amer: >60
GFR calc Af Amer: >60
GFR calc non Af Amer: 51 — ABNORMAL LOW
GFR calc non Af Amer: 60 — ABNORMAL LOW
Potassium: 4.7

## 2010-11-19 LAB — PROTIME-INR
INR: 1.1
INR: 1.6 — ABNORMAL HIGH
INR: 2.6 — ABNORMAL HIGH
Prothrombin Time: 14.4
Prothrombin Time: 19.9 — ABNORMAL HIGH

## 2010-11-19 LAB — CBC
HCT: 28.1 — ABNORMAL LOW
MCHC: 34.5
MCHC: 34.8
MCV: 85
Platelets: 175
RBC: 3.55 — ABNORMAL LOW
RDW: 15.5
WBC: 10.9 — ABNORMAL HIGH
WBC: 8.2

## 2010-11-19 LAB — CROSSMATCH

## 2010-11-26 ENCOUNTER — Other Ambulatory Visit (HOSPITAL_COMMUNITY): Payer: Self-pay | Admitting: Family Medicine

## 2010-11-26 DIAGNOSIS — Z1231 Encounter for screening mammogram for malignant neoplasm of breast: Secondary | ICD-10-CM

## 2011-01-06 ENCOUNTER — Ambulatory Visit (HOSPITAL_COMMUNITY)
Admission: RE | Admit: 2011-01-06 | Discharge: 2011-01-06 | Disposition: A | Discharge status: Home / Self Care | Payer: Self-pay | Source: Ambulatory Visit | Attending: Family Medicine | Admitting: Family Medicine

## 2011-01-06 DIAGNOSIS — Z1231 Encounter for screening mammogram for malignant neoplasm of breast: Secondary | ICD-10-CM | POA: Insufficient documentation

## 2011-01-20 ENCOUNTER — Other Ambulatory Visit (HOSPITAL_COMMUNITY): Payer: Self-pay | Admitting: Family Medicine

## 2011-01-20 DIAGNOSIS — M8580 Other specified disorders of bone density and structure, unspecified site: Secondary | ICD-10-CM

## 2011-01-22 ENCOUNTER — Ambulatory Visit (HOSPITAL_COMMUNITY)
Admission: RE | Admit: 2011-01-22 | Discharge: 2011-01-22 | Disposition: A | Discharge status: Home / Self Care | Payer: Self-pay | Source: Ambulatory Visit | Attending: Family Medicine | Admitting: Family Medicine

## 2011-01-22 DIAGNOSIS — Z13820 Encounter for screening for osteoporosis: Secondary | ICD-10-CM | POA: Insufficient documentation

## 2011-01-22 DIAGNOSIS — Z780 Asymptomatic menopausal state: Secondary | ICD-10-CM | POA: Insufficient documentation

## 2011-01-22 DIAGNOSIS — M8580 Other specified disorders of bone density and structure, unspecified site: Secondary | ICD-10-CM

## 2011-05-14 ENCOUNTER — Emergency Department (HOSPITAL_COMMUNITY)
Admission: EM | Admit: 2011-05-14 | Discharge: 2011-05-14 | Disposition: A | Discharge status: Home / Self Care | Payer: Self-pay | Source: Home / Self Care | Attending: Family Medicine | Admitting: Family Medicine

## 2011-05-14 ENCOUNTER — Other Ambulatory Visit: Payer: Self-pay

## 2011-05-14 ENCOUNTER — Encounter (HOSPITAL_COMMUNITY): Payer: Self-pay

## 2011-05-14 DIAGNOSIS — R42 Dizziness and giddiness: Secondary | ICD-10-CM

## 2011-05-14 DIAGNOSIS — H8110 Benign paroxysmal vertigo, unspecified ear: Secondary | ICD-10-CM

## 2011-05-14 DIAGNOSIS — IMO0002 Reserved for concepts with insufficient information to code with codable children: Secondary | ICD-10-CM

## 2011-05-14 HISTORY — DX: Essential (primary) hypertension: I10

## 2011-05-14 LAB — POCT I-STAT, CHEM 8
BUN: 11 mg/dL (ref 6–23)
Calcium, Ion: 1.14 mmol/L (ref 1.12–1.32)
Chloride: 94 mEq/L — ABNORMAL LOW (ref 96–112)
Glucose, Bld: 173 mg/dL — ABNORMAL HIGH (ref 70–99)
TCO2: 20 mmol/L (ref 0–100)

## 2011-05-14 MED ORDER — MECLIZINE HCL 12.5 MG PO TABS: 12.5000 mg | ORAL_TABLET | Freq: Three times a day (TID) | ORAL | Status: DC | PRN

## 2011-05-14 MED ORDER — BENZONATATE 100 MG PO CAPS: 100.0000 mg | ORAL_CAPSULE | Freq: Three times a day (TID) | ORAL | Status: DC

## 2011-05-14 NOTE — ED Notes (Signed)
C/o HA, sound in head, cough for 1 week

## 2011-05-14 NOTE — Discharge Instructions (Signed)
Mantengase bien hydratada. Siga las instrucciones del folleto para evitar vertigo posicional. Debe regresar con su medico durante la semana para que le rechecqueen la presion.  Vaya al departamento de emergencia si empeoran sus sintomas como perdida del equilibrio, debilidad para mover los brazos o las piernas, cambios en la vision o dificultad para hablar o entender cuando le hablan.

## 2011-05-16 ENCOUNTER — Emergency Department (HOSPITAL_COMMUNITY): Payer: Medicaid Other

## 2011-05-16 ENCOUNTER — Inpatient Hospital Stay (HOSPITAL_COMMUNITY)
Admission: EM | Admit: 2011-05-16 | Discharge: 2011-05-19 | DRG: 689 | Disposition: A | Discharge status: Home / Self Care | Payer: Medicaid Other | Attending: Internal Medicine | Admitting: Internal Medicine

## 2011-05-16 ENCOUNTER — Encounter (HOSPITAL_COMMUNITY): Payer: Self-pay | Admitting: Emergency Medicine

## 2011-05-16 ENCOUNTER — Other Ambulatory Visit: Payer: Self-pay

## 2011-05-16 DIAGNOSIS — R42 Dizziness and giddiness: Secondary | ICD-10-CM | POA: Diagnosis present

## 2011-05-16 DIAGNOSIS — E875 Hyperkalemia: Secondary | ICD-10-CM | POA: Diagnosis present

## 2011-05-16 DIAGNOSIS — R627 Adult failure to thrive: Secondary | ICD-10-CM | POA: Diagnosis present

## 2011-05-16 DIAGNOSIS — E119 Type 2 diabetes mellitus without complications: Secondary | ICD-10-CM | POA: Diagnosis present

## 2011-05-16 DIAGNOSIS — J189 Pneumonia, unspecified organism: Secondary | ICD-10-CM | POA: Diagnosis present

## 2011-05-16 DIAGNOSIS — Z7982 Long term (current) use of aspirin: Secondary | ICD-10-CM

## 2011-05-16 DIAGNOSIS — I10 Essential (primary) hypertension: Secondary | ICD-10-CM | POA: Diagnosis present

## 2011-05-16 DIAGNOSIS — E871 Hypo-osmolality and hyponatremia: Secondary | ICD-10-CM | POA: Diagnosis present

## 2011-05-16 DIAGNOSIS — E785 Hyperlipidemia, unspecified: Secondary | ICD-10-CM

## 2011-05-16 DIAGNOSIS — N390 Urinary tract infection, site not specified: Principal | ICD-10-CM | POA: Diagnosis present

## 2011-05-16 DIAGNOSIS — E860 Dehydration: Secondary | ICD-10-CM

## 2011-05-16 DIAGNOSIS — R6251 Failure to thrive (child): Secondary | ICD-10-CM

## 2011-05-16 DIAGNOSIS — K5730 Diverticulosis of large intestine without perforation or abscess without bleeding: Secondary | ICD-10-CM

## 2011-05-16 LAB — CBC
HCT: 35.1 % — ABNORMAL LOW (ref 36.0–46.0)
MCH: 27.9 pg (ref 26.0–34.0)
MCHC: 35 g/dL (ref 30.0–36.0)
MCV: 79.6 fL (ref 78.0–100.0)
Platelets: 255 10*3/uL (ref 150–400)
RDW: 13.2 % (ref 11.5–15.5)
WBC: 9.4 10*3/uL (ref 4.0–10.5)

## 2011-05-16 LAB — URINALYSIS, ROUTINE W REFLEX MICROSCOPIC
Bilirubin Urine: NEGATIVE
Ketones, ur: NEGATIVE mg/dL
Nitrite: NEGATIVE
Protein, ur: NEGATIVE mg/dL
pH: 6 (ref 5.0–8.0)

## 2011-05-16 LAB — DIFFERENTIAL
Basophils Absolute: 0 10*3/uL (ref 0.0–0.1)
Basophils Relative: 0 % (ref 0–1)
Eosinophils Absolute: 0.1 10*3/uL (ref 0.0–0.7)
Eosinophils Relative: 2 % (ref 0–5)
Lymphocytes Relative: 24 % (ref 12–46)
Monocytes Absolute: 0.6 10*3/uL (ref 0.1–1.0)

## 2011-05-16 LAB — URINE MICROSCOPIC-ADD ON

## 2011-05-16 LAB — BASIC METABOLIC PANEL
Chloride: 87 mEq/L — ABNORMAL LOW (ref 96–112)
GFR calc Af Amer: 82 mL/min — ABNORMAL LOW (ref >90)
Potassium: 4.5 mEq/L (ref 3.5–5.1)
Sodium: 121 mEq/L — ABNORMAL LOW (ref 135–145)

## 2011-05-16 LAB — COMPREHENSIVE METABOLIC PANEL
Albumin: 3.7 g/dL (ref 3.5–5.2)
BUN: 14 mg/dL (ref 6–23)
Calcium: 9.7 mg/dL (ref 8.4–10.5)
Chloride: 87 mEq/L — ABNORMAL LOW (ref 96–112)
Creatinine, Ser: 0.78 mg/dL (ref 0.50–1.10)
GFR calc non Af Amer: 80 mL/min — ABNORMAL LOW (ref >90)
Total Bilirubin: 0.3 mg/dL (ref 0.3–1.2)

## 2011-05-16 LAB — POCT I-STAT TROPONIN I: Troponin i, poc: 0 ng/mL (ref 0.00–0.08)

## 2011-05-16 LAB — OSMOLALITY: Osmolality: 254 mOsm/kg — ABNORMAL LOW (ref 275–300)

## 2011-05-16 LAB — GLUCOSE, CAPILLARY: Glucose-Capillary: 86 mg/dL (ref 70–99)

## 2011-05-16 MED ORDER — GLIPIZIDE 10 MG PO TABS
10.0000 mg | ORAL_TABLET | Freq: Two times a day (BID) | ORAL | Status: DC
  Administered 2011-05-17: 10 mg via ORAL
  Filled 2011-05-16 (×3): qty 1

## 2011-05-16 MED ORDER — BENZONATATE 100 MG PO CAPS
100.0000 mg | ORAL_CAPSULE | Freq: Three times a day (TID) | ORAL | Status: DC
  Administered 2011-05-17 – 2011-05-19 (×7): 100 mg via ORAL
  Filled 2011-05-16 (×9): qty 1

## 2011-05-16 MED ORDER — ALBUTEROL SULFATE (5 MG/ML) 0.5% IN NEBU: 2.5000 mg | INHALATION_SOLUTION | RESPIRATORY_TRACT | Status: DC | PRN

## 2011-05-16 MED ORDER — INSULIN ASPART 100 UNIT/ML ~~LOC~~ SOLN: 0.0000 [IU] | Freq: Three times a day (TID) | SUBCUTANEOUS | Status: DC

## 2011-05-16 MED ORDER — SODIUM CHLORIDE 0.9 % IV SOLN
INTRAVENOUS | Status: DC
  Administered 2011-05-16: 125 mL via INTRAVENOUS

## 2011-05-16 MED ORDER — SODIUM CHLORIDE 0.9 % IV SOLN
INTRAVENOUS | Status: DC
  Administered 2011-05-17: 50 mL/h via INTRAVENOUS

## 2011-05-16 MED ORDER — ASPIRIN 81 MG PO TABS: 81.0000 mg | ORAL_TABLET | Freq: Every day | ORAL | Status: DC

## 2011-05-16 MED ORDER — SODIUM CHLORIDE 0.9 % IV BOLUS (SEPSIS)
1000.0000 mL | Freq: Once | INTRAVENOUS | Status: AC
  Administered 2011-05-16: 1000 mL via INTRAVENOUS

## 2011-05-16 MED ORDER — HYDRALAZINE HCL 20 MG/ML IJ SOLN
10.0000 mg | Freq: Four times a day (QID) | INTRAMUSCULAR | Status: DC | PRN
  Filled 2011-05-16: qty 0.5

## 2011-05-16 MED ORDER — DEXTROSE 5 % IV SOLN
1.0000 g | INTRAVENOUS | Status: DC
  Administered 2011-05-17: 1 g via INTRAVENOUS
  Filled 2011-05-16 (×2): qty 10

## 2011-05-16 MED ORDER — ENOXAPARIN SODIUM 40 MG/0.4ML ~~LOC~~ SOLN
40.0000 mg | SUBCUTANEOUS | Status: DC
  Administered 2011-05-17 – 2011-05-18 (×2): 40 mg via SUBCUTANEOUS
  Filled 2011-05-16 (×3): qty 0.4

## 2011-05-16 MED ORDER — MECLIZINE HCL 12.5 MG PO TABS
12.5000 mg | ORAL_TABLET | Freq: Three times a day (TID) | ORAL | Status: DC | PRN
Start: 2011-05-16 — End: 2011-05-19

## 2011-05-16 MED ORDER — ASPIRIN 81 MG PO CHEW
81.0000 mg | CHEWABLE_TABLET | Freq: Every day | ORAL | Status: DC
  Administered 2011-05-17 – 2011-05-19 (×3): 81 mg via ORAL
  Filled 2011-05-16 (×3): qty 1

## 2011-05-16 MED ORDER — DEXTROSE 5 % IV SOLN
1.0000 g | Freq: Once | INTRAVENOUS | Status: AC
  Administered 2011-05-16: 1 g via INTRAVENOUS
  Filled 2011-05-16: qty 10

## 2011-05-16 MED ORDER — MOXIFLOXACIN HCL 400 MG PO TABS
400.0000 mg | ORAL_TABLET | Freq: Once | ORAL | Status: AC
  Administered 2011-05-16: 400 mg via ORAL
  Filled 2011-05-16: qty 1

## 2011-05-16 MED ORDER — DEXTROSE 5 % IV SOLN
500.0000 mg | Freq: Every day | INTRAVENOUS | Status: DC
  Administered 2011-05-17 (×2): 500 mg via INTRAVENOUS
  Filled 2011-05-16 (×3): qty 500

## 2011-05-16 MED ORDER — INSULIN ASPART 100 UNIT/ML ~~LOC~~ SOLN: 0.0000 [IU] | Freq: Every day | SUBCUTANEOUS | Status: DC

## 2011-05-16 NOTE — ED Provider Notes (Signed)
History     CSN: [id number]  Arrival date & time 05/16/11  1146   First MD Initiated Contact with Patient 05/16/11 1218      Chief Complaint  Patient presents with  . Abnormal Lab    (Consider location/radiation/quality/duration/timing/severity/associated sxs/prior treatment) HPI  This is a 77 year old Hispanic female w/ hx of HTN and diabetes presents to the ED accompanied by her daughter for a lab recheck. History was obtained through her daughter via an interpreter.  Patient has been complaining of increased generalized weakness, nonproductive cough, sob, and dizziness for the past week. Her dizziness is intermittent, worsening with positional change, and lasting for minutes to hours. Cough is nonproductive, with no associated CP. Denies hemoptysis.  SOB only on exertion.  She presents at urgent care 2 days ago for evaluation. She was diagnosed with benign paroxysmal positional vertigo, and was given meclizine. Lab work was obtained which shows evidence of hyponatremia and hyperkalemia. Patient has a remote history of nonfunctioning right kidney per daughter.  Therefore, she was recommended to come to the ED for further evaluation. Patient states the fatigue has been gradual in onset, and consistence. Patient complaining of being out of breath with exertion. She has no appetite according to her daughter she does not drink enough fluid. Patient occasionally complaining of mild headache to the back of her head, but denies double vision, sore throat, sneezing, coughing, nausea, vomiting, diarrhea, and abdominal pain. Denies calf pain or lower extremity swelling. No increased risk of breath when laying down. Patient denies dysuria, or rash. No medication changes except discontinue her sleeping medications amitriptyline. Daughter noticed that the blood pressures been high for the past week.  Past Medical History  Diagnosis Date  . Hypertension   . Diabetes mellitus     No past surgical history  on file.  No family history on file.  History  Substance Use Topics  . Smoking status: Not on file  . Smokeless tobacco: Not on file  . Alcohol Use:     OB History    Grav Para Term Preterm Abortions TAB SAB Ect Mult Living                  Review of Systems  All other systems reviewed and are negative.    Allergies  Review of patient's allergies indicates no known allergies.  Home Medications   Current Outpatient Rx  Name Route Sig Dispense Refill  . ALENDRONATE SODIUM 70 MG PO TABS Oral Take 70 mg by mouth every 7 (seven) days. Take with a full glass of water on an empty stomach.    . ASPIRIN 81 MG PO TABS Oral Take 81 mg by mouth daily.    Marland Kitchen BENZONATATE 100 MG PO CAPS Oral Take 100 mg by mouth every 8 (eight) hours. cough    . CETIRIZINE HCL 10 MG PO TABS Oral Take 10 mg by mouth daily.    Marland Kitchen GLIPIZIDE 10 MG PO TABS Oral Take 10 mg by mouth 2 (two) times daily before a meal.    . HYDROCHLOROTHIAZIDE 12.5 MG PO TABS Oral Take 12.5 mg by mouth daily.    Marland Kitchen LISINOPRIL 40 MG PO TABS Oral Take 40 mg by mouth daily.    Marland Kitchen METFORMIN HCL 1000 MG PO TABS Oral Take 1,000 mg by mouth 2 (two) times daily with a meal.    . METOPROLOL SUCCINATE ER 25 MG PO TB24 Oral Take 25 mg by mouth daily.    Marland Kitchen METOPROLOL TARTRATE  25 MG PO TABS Oral Take 25 mg by mouth at bedtime.    Jeralyn Bennett CALCIUM 500 MG PO TABS Oral Take 500 mg of elemental calcium by mouth 2 (two) times daily.    Marland Kitchen PRAVASTATIN SODIUM 40 MG PO TABS Oral Take 40 mg by mouth daily.    Marland Kitchen SITAGLIPTIN PHOSPHATE 100 MG PO TABS Oral Take 100 mg by mouth daily.    Marland Kitchen MECLIZINE HCL 12.5 MG PO TABS Oral Take 12.5 mg by mouth 3 (three) times daily as needed. dizziness      BP 154/48  Pulse 62  Temp(Src) 97.9 F (36.6 C) (Oral)  Resp 20  SpO2 97%  Physical Exam  Nursing note and vitals reviewed. Constitutional: She appears well-developed and well-nourished. No distress.       Awake, alert, nontoxic appearance  HENT:  Head:  Atraumatic.  Right Ear: External ear normal.  Left Ear: External ear normal.  Mouth/Throat: Oropharynx is clear and moist. No oropharyngeal exudate.  Eyes: Conjunctivae are normal. Right eye exhibits no discharge. Left eye exhibits no discharge.  Neck: Neck supple.  Cardiovascular: Normal rate and regular rhythm.   Pulmonary/Chest: Effort normal. No respiratory distress. She exhibits no tenderness.  Abdominal: Soft. There is no tenderness. There is no rebound and no CVA tenderness.  Musculoskeletal: She exhibits no tenderness.       ROM appears intact, no obvious focal weakness.  No lower extremity edema  Neurological: She is alert. She exhibits normal muscle tone. Coordination normal.       Mental status and motor strength appears intact  Skin: Skin is warm. No rash noted.  Psychiatric: She has a normal mood and affect.    ED Course  Procedures (including critical care time)  Labs Reviewed - No data to display No results found.   No diagnosis found.   Date: 05/16/2011  Rate: 65  Rhythm: normal sinus rhythm  QRS Axis: normal  Intervals: normal  ST/T Wave abnormalities: normal  Conduction Disutrbances:none  Narrative Interpretation:   Old EKG Reviewed: unchanged  Results for orders placed during the hospital encounter of 05/16/11  CBC      Component Value Range   WBC 9.4  4.0 - 10.5 (K/uL)   RBC 4.41  3.87 - 5.11 (MIL/uL)   Hemoglobin 12.3  12.0 - 15.0 (g/dL)   HCT 16.1 (*) 09.6 - 46.0 (%)   MCV 79.6  78.0 - 100.0 (fL)   MCH 27.9  26.0 - 34.0 (pg)   MCHC 35.0  30.0 - 36.0 (g/dL)   RDW 04.5  40.9 - 81.1 (%)   Platelets 255  150 - 400 (K/uL)  DIFFERENTIAL      Component Value Range   Neutrophils Relative 68  43 - 77 (%)   Neutro Abs 6.4  1.7 - 7.7 (K/uL)   Lymphocytes Relative 24  12 - 46 (%)   Lymphs Abs 2.2  0.7 - 4.0 (K/uL)   Monocytes Relative 7  3 - 12 (%)   Monocytes Absolute 0.6  0.1 - 1.0 (K/uL)   Eosinophils Relative 2  0 - 5 (%)   Eosinophils Absolute  0.1  0.0 - 0.7 (K/uL)   Basophils Relative 0  0 - 1 (%)   Basophils Absolute 0.0  0.0 - 0.1 (K/uL)  URINALYSIS, ROUTINE W REFLEX MICROSCOPIC      Component Value Range   Color, Urine YELLOW  YELLOW    APPearance CLOUDY (*) CLEAR    Specific Gravity, Urine 1.009  1.005 - 1.030    pH 6.0  5.0 - 8.0    Glucose, UA NEGATIVE  NEGATIVE (mg/dL)   Hgb urine dipstick NEGATIVE  NEGATIVE    Bilirubin Urine NEGATIVE  NEGATIVE    Ketones, ur NEGATIVE  NEGATIVE (mg/dL)   Protein, ur NEGATIVE  NEGATIVE (mg/dL)   Urobilinogen, UA 0.2  0.0 - 1.0 (mg/dL)   Nitrite NEGATIVE  NEGATIVE    Leukocytes, UA LARGE (*) NEGATIVE   COMPREHENSIVE METABOLIC PANEL      Component Value Range   Sodium 122 (*) 135 - 145 (mEq/L)   Potassium 5.0  3.5 - 5.1 (mEq/L)   Chloride 87 (*) 96 - 112 (mEq/L)   CO2 22  19 - 32 (mEq/L)   Glucose, Bld 156 (*) 70 - 99 (mg/dL)   BUN 14  6 - 23 (mg/dL)   Creatinine, Ser 1.61  0.50 - 1.10 (mg/dL)   Calcium 9.7  8.4 - 09.6 (mg/dL)   Total Protein 7.3  6.0 - 8.3 (g/dL)   Albumin 3.7  3.5 - 5.2 (g/dL)   AST 29  0 - 37 (U/L)   ALT 27  0 - 35 (U/L)   Alkaline Phosphatase 74  39 - 117 (U/L)   Total Bilirubin 0.3  0.3 - 1.2 (mg/dL)   GFR calc non Af Amer 80 (*) >90 (mL/min)   GFR calc Af Amer >90  >90 (mL/min)  POCT I-STAT TROPONIN I      Component Value Range   Troponin i, poc 0.00  0.00 - 0.08 (ng/mL)   Comment 3           URINE MICROSCOPIC-ADD ON      Component Value Range   Squamous Epithelial / LPF RARE  RARE    WBC, UA TOO NUMEROUS TO COUNT  <3 (WBC/hpf)   RBC / HPF 0-2  <3 (RBC/hpf)   Bacteria, UA MANY (*) RARE    Dg Chest 2 View  05/16/2011  *RADIOLOGY REPORT*  Clinical Data: Fatigue.  History diabetes hypertension.  CHEST - 2 VIEW 2013:  Comparison: Two-view chest x-ray 10/03/2008, 05/24/2007 [facility], and 08/11/2005 [facility].  Findings: Cardiac silhouette mildly enlarged but stable.  Thoracic aorta mildly tortuous, unchanged.  Hilar and  mediastinal contours otherwise unremarkable.  Suboptimal inspiration which accounts for crowded bronchovascular markings diffusely.  Focal opacity in the right perihilar region, localizing to the central right upper lobe on the lateral image.  Lungs otherwise clear.  No pleural effusions.  Degenerative changes involving the thoracic and upper lumbar spine.  IMPRESSION: Focal atelectasis versus pneumonia in the medial right upper lobe, in the right perihilar region.  Follow-up chest x-ray recommended after treatment in order to assure resolution.  Original Report Authenticated By: Arnell Sieving, M.D.   CRITICAL CARE Performed by: Fayrene Helper   Total critical care time: 35  Critical care time was exclusive of separately billable procedures and treating other patients.  Critical care was necessary to treat or prevent imminent or life-threatening deterioration.  Critical care was time spent personally by me on the following activities: development of treatment plan with patient and/or surrogate as well as nursing, discussions with consultants, evaluation of patient's response to treatment, examination of patient, obtaining history from patient or surrogate, ordering and performing treatments and interventions, ordering and review of laboratory studies, ordering and review of radiographic studies, pulse oximetry and re-evaluation of patient's condition.     MDM  Cough, dyspnea on exertion, and  Positional dizziness  along with generalized fatigue.  Lack of PO intake per family.  Pt is here due to low Na+ and elevated K+ on diagnostic labs 2 days ago.  Will reevaluate with labs, CXR, UA, troponin, CMP, and ECG.  Will give IVF.     3:03 PM Chest x-ray suspect for pneumonia, which is consistent with patient's presentation including coughing, and shortness of breath. Patient is currently in no acute respiratory distress. She retains normal oxygenation with ambulation. She has evidence of a urinary  tract infection on UA, this may correspond with the generalized fatigue. Her labs significant for a sodium of 122 and a chloride of 87. She has normal renal function. IV fluid given in this visit.  Rocephin and Avelox given.  3:30 PM Discussed with [facility], who agrees to admit pt for UTI, Community acquired pneumonia, and failure to thrive.  Pt admits to med surg bed, Team 2, Dr. Benjamine Mola.       Fayrene Helper, PA-C 05/16/11 1535  Fayrene Helper, PA-C 05/16/11 1547

## 2011-05-16 NOTE — ED Notes (Signed)
RT paged to give breathing treatment to pt.

## 2011-05-16 NOTE — ED Notes (Signed)
[id number] Ready 

## 2011-05-16 NOTE — ED Notes (Signed)
Patient and patient's daughter stated here for evaluation for abnormal blood. Doctor called patient today sent to ED for evaluation.  Possible abnormal sodium, potasium, and renal function. Denies any pain.

## 2011-05-16 NOTE — ED Provider Notes (Signed)
History     CSN: [id number]  Arrival date & time 05/14/11  1906   First MD Initiated Contact with Patient 05/14/11 2001      Chief Complaint  Patient presents with  . Cough    (Consider location/radiation/quality/duration/timing/severity/associated sxs/prior treatment) HPI Comments: 77 y/o female with h/o HTN, diabetes and seasonal allergies among other comorbidities here with daughter c/o episodes of dizziness and ringing in her ears associated with nausea. Has had these symptoms in the past but currently worsened by a cough. Denies chest pain or shortness of breath. No leg swelling or PND. Has had these symptoms for 1 week. Has seen her PCP as her blood pressure has been high and her medications are being adjusted. No fever or chills.   Past Medical History  Diagnosis Date  . Hypertension   . Diabetes mellitus     History reviewed. No pertinent past surgical history.  History reviewed. No pertinent family history.  History  Substance Use Topics  . Smoking status: Not on file  . Smokeless tobacco: Not on file  . Alcohol Use:     OB History    Grav Para Term Preterm Abortions TAB SAB Ect Mult Living                  Review of Systems  Constitutional: Positive for appetite change. Negative for fever.  HENT: Negative for sore throat.   Respiratory: Positive for cough. Negative for shortness of breath.   Cardiovascular: Negative for chest pain, palpitations and leg swelling.  Gastrointestinal: Negative for abdominal pain.  Neurological: Positive for dizziness and headaches. Negative for tremors, seizures, syncope, weakness and numbness.    Allergies  Review of patient's allergies indicates no known allergies.  Home Medications   Current Outpatient Rx  Name Route Sig Dispense Refill  . ALENDRONATE SODIUM 70 MG PO TABS Oral Take 70 mg by mouth every 7 (seven) days. Take with a full glass of water on an empty stomach.    . CETIRIZINE HCL 10 MG PO TABS Oral Take 10  mg by mouth daily.    Marland Kitchen HYDROCHLOROTHIAZIDE 12.5 MG PO TABS Oral Take 12.5 mg by mouth daily.    Marland Kitchen LISINOPRIL 40 MG PO TABS Oral Take 40 mg by mouth daily.    Marland Kitchen METFORMIN HCL 1000 MG PO TABS Oral Take 1,000 mg by mouth 2 (two) times daily with a meal.    . METOPROLOL SUCCINATE ER 25 MG PO TB24 Oral Take 25 mg by mouth daily.    Jeralyn Bennett CALCIUM 500 MG PO TABS Oral Take 500 mg of elemental calcium by mouth 2 (two) times daily.    Marland Kitchen PRAVASTATIN SODIUM 40 MG PO TABS Oral Take 40 mg by mouth daily.    Marland Kitchen SITAGLIPTIN PHOSPHATE 100 MG PO TABS Oral Take 100 mg by mouth daily.    Marland Kitchen BENZONATATE 100 MG PO CAPS Oral Take 1 capsule (100 mg total) by mouth every 8 (eight) hours. 21 capsule 0  . MECLIZINE HCL 12.5 MG PO TABS Oral Take 1 tablet (12.5 mg total) by mouth 3 (three) times daily as needed for dizziness or nausea. 30 tablet 0    BP 143/50  Pulse 58  Temp(Src) 98.3 F (36.8 C) (Oral)  Resp 20  SpO2 97%  Physical Exam  Nursing note and vitals reviewed. Constitutional: She is oriented to person, place, and time. She appears well-developed and well-nourished. No distress.  HENT:  Head: Normocephalic and atraumatic.  Right Ear: External  ear normal.  Left Ear: External ear normal.  Nose: Nose normal.  Mouth/Throat: Oropharynx is clear and moist. No oropharyngeal exudate.  Eyes: EOM are normal. Pupils are equal, round, and reactive to light.  Neck: Neck supple. No JVD present. No thyromegaly present.  Cardiovascular: Normal rate, regular rhythm and normal heart sounds.  Exam reveals no gallop and no friction rub.   No murmur heard. Pulmonary/Chest: Breath sounds normal. No respiratory distress. She has no wheezes. She has no rales.  Abdominal: Soft.  Musculoskeletal: She exhibits no edema.  Lymphadenopathy:    She has no cervical adenopathy.  Neurological: She is alert and oriented to person, place, and time.  Skin: No rash noted.    ED Course  Procedures (including critical care  time)  Labs Reviewed  POCT I-STAT, CHEM 8 - Abnormal; Notable for the following:    Sodium 120 (*)    Potassium 5.2 (*)    Chloride 94 (*)    Glucose, Bld 173 (*)    All other components within normal limits  LAB REPORT - SCANNED   No results found.   1. Vertigo, benign positional       MDM  EKG:NSR, no ischemic changes, bradycardic, rate in 50s.  Patient I-stat results reviewed after patient has been discharged. Noticed Hyponatremia and hyperkalemia appears in acute renal failure. Called home explain patient and daughter about abnormal lab results and need to bring patient back to [facility] ED.         Sharin Grave, MD 05/16/11 1004

## 2011-05-16 NOTE — ED Notes (Signed)
Pt seen at Urgent Care earlier this week and dx with low sodium, high potassium and possible altered kidney function. Pt denies any pain, but reports generalized fatigue. Decreased appetite.

## 2011-05-16 NOTE — ED Notes (Signed)
Received report from [facility] . Pt came to the ED because she was told she abnormal labs by her primary doctor. Currently pt is walking around room. No respiratory or cardiac distress. No pain. Will continue to monitor.

## 2011-05-16 NOTE — ED Notes (Signed)
Pt. CBG was 86

## 2011-05-16 NOTE — H&P (Signed)
Patient's PCP: Norberto Sorenson, MD, MD  Chief Complaint: sent by urgent care for abnormal labs  History of Present Illness: Joyce Gilmore is a 77 y.o. hispanic female presented to the ED accompanied by her daughter for a lab recheck. History was obtained through her daughter via an interpreter phone. Patient has been complaining of increased generalized weakness, nonproductive cough, sob, and dizziness for the past week. Her dizziness is intermittent, worsening with positional change, and lasting for minutes to hours. Cough is nonproductive, with no associated CP. Denies hemoptysis. SOB only on exertion. She presents at urgent care 2 days ago for evaluation. She was diagnosed with benign paroxysmal positional vertigo, and was given meclizine. Lab work was obtained which shows evidence of hyponatremia and hyperkalemia.- they notified her to go to the ER.   Patient has a remote history of nonfunctioning right kidney per daughter.  Patient states the fatigue has been gradual in onset, and consistence. Patient complaining of being out of breath with exertion. She has no appetite according to her daughter she does not drink enough fluid. Patient occasionally complaining of mild headache to the back of her head, but denies double vision, sore throat, sneezing, coughing, nausea, vomiting, diarrhea, and abdominal pain. Denies calf pain or lower extremity swelling. No increased risk of breath when laying down. Patient denies dysuria, or rash. No medication changes except discontinue her sleeping medications amitriptyline. Daughter noticed that the blood pressures been high for the past week.  Meds: Scheduled Meds:    . sodium chloride   Intravenous STAT  . cefTRIAXone (ROCEPHIN)  IV  1 g Intravenous Once  . moxifloxacin  400 mg Oral Once  . sodium chloride  1,000 mL Intravenous Once   Continuous Infusions:  PRN Meds:. Allergies: Review of patient's allergies indicates no known allergies. Past Medical  History  Diagnosis Date  . Hypertension   . Diabetes mellitus    No past surgical history on file. No family history on file. History   Social History  . Marital Status: Widowed    Spouse Name: N/A    Number of Children: N/A  . Years of Education: N/A   Occupational History  . Not on file.   Social History Main Topics  . Smoking status: denies  . Smokeless tobacco: Not on file  . Alcohol Use:   . Drug Use:   . Sexually Active:    Other Topics Concern  . Not on file   Social History Narrative  . No narrative on file   Review of Systems: All systems reviewed with the patient and positive as per history of present illness, otherwise all other systems are negative.   Physical Exam: Blood pressure 134/75, pulse 61, temperature 97.5 F (36.4 C), temperature source Oral, resp. rate 20, SpO2 100.00%. General: Awake, Oriented x3, No acute distress.- family at bedside HEENT: EOMI, Moist mucous membranes Neck: Supple CV: S1 and S2 Lungs: Clear to ascultation bilaterally Abdomen: Soft, Nontender, Nondistended, +bowel sounds. Ext: Good pulses. Trace edema. No clubbing or cyanosis noted. Neuro: Cranial Nerves II-XII grossly intact. Has 5/5 motor strength in upper and lower extremities.    Lab results:  [facility] 05/16/11 1248 05/14/11 2113  NA 122* 120*  K 5.0 5.2*  CL 87* 94*  CO2 22 --  GLUCOSE 156* 173*  BUN 14 11  CREATININE 0.78 0.70  CALCIUM 9.7 --  MG -- --  PHOS -- --    Basename 05/16/11 1248  AST 29  ALT 27  ALKPHOS 74  BILITOT 0.3  PROT 7.3  ALBUMIN 3.7   No results found for this basename: LIPASE:2,AMYLASE:2 in the last 72 hours  Basename 05/16/11 1248 05/14/11 2113  WBC 9.4 --  NEUTROABS 6.4 --  HGB 12.3 14.3  HCT 35.1* 42.0  MCV 79.6 --  PLT 255 --   No results found for this basename: CKTOTAL:3,CKMB:3,CKMBINDEX:3,TROPONINI:3 in the last 72 hours No components found with this basename: POCBNP:3 No results found for this basename:  DDIMER in the last 72 hours No results found for this basename: HGBA1C:2 in the last 72 hours No results found for this basename: CHOL:2,HDL:2,LDLCALC:2,TRIG:2,CHOLHDL:2,LDLDIRECT:2 in the last 72 hours No results found for this basename: TSH,T4TOTAL,FREET3,T3FREE,THYROIDAB in the last 72 hours No results found for this basename: VITAMINB12:2,FOLATE:2,FERRITIN:2,TIBC:2,IRON:2,RETICCTPCT:2 in the last 72 hours Imaging results:  Dg Chest 2 View  05/16/2011  *RADIOLOGY REPORT*  Clinical Data: Fatigue.  History diabetes hypertension.  CHEST - 2 VIEW 2013:  Comparison: Two-view chest x-ray 10/03/2008, 05/24/2007 [facility], and 08/11/2005 [facility].  Findings: Cardiac silhouette mildly enlarged but stable.  Thoracic aorta mildly tortuous, unchanged.  Hilar and mediastinal contours otherwise unremarkable.  Suboptimal inspiration which accounts for crowded bronchovascular markings diffusely.  Focal opacity in the right perihilar region, localizing to the central right upper lobe on the lateral image.  Lungs otherwise clear.  No pleural effusions.  Degenerative changes involving the thoracic and upper lumbar spine.  IMPRESSION: Focal atelectasis versus pneumonia in the medial right upper lobe, in the right perihilar region.  Follow-up chest x-ray recommended after treatment in order to assure resolution.  Original Report Authenticated By: Arnell Sieving, M.D.     Assessment & Plan by Problem:   *Hyponatremia- patient is on HCTZ - will stop, review of old Na levels shows that her base is around 130 Follow BMPs closely  UTI (lower urinary tract infection)- abx, culture pending   Community acquired pneumonia- BC x 2, abx, nebs   DIABETES MELLITUS, TYPE II- SSI, continue home medications   Dizziness- multifactorial- ?BPV vs medication vs hyponatremia   Hyperkalemia- ? Medication related, kidney function good, hold ACE   HTN (hypertension)- d/c HCTZ/ACE, add hydralazine PRN and  monitor  Fatigue- check TSH, vs infectious process  Time spent on admission, talking to the patient, and coordinating care was: 55 mins.   Dontavion Noxon, DO 05/16/2011, 4:01 PM

## 2011-05-16 NOTE — ED Provider Notes (Signed)
Medical screening examination/treatment/procedure(s) were conducted as a shared visit with non-physician practitioner(s) and myself.  I personally evaluated the patient during the encounter  No distress, VS are stable.  Per family, pt has been more confused than typical, no focal deficits here.  Likely low Na is cause.  Pt has been more dehydrated in general, IVF's, given will need treatment for UTI.  CXR suggests possible PNA, will go ahead and treat, however no fever, or elevated WBC.    Gavin Pound. Marian Meneely, MD 05/16/11 1642

## 2011-05-17 ENCOUNTER — Encounter (HOSPITAL_COMMUNITY): Payer: Self-pay | Admitting: *Deleted

## 2011-05-17 LAB — BASIC METABOLIC PANEL
BUN: 14 mg/dL (ref 6–23)
CO2: 19 mEq/L (ref 19–32)
Calcium: 9 mg/dL (ref 8.4–10.5)
Chloride: 88 mEq/L — ABNORMAL LOW (ref 96–112)
Chloride: 90 mEq/L — ABNORMAL LOW (ref 96–112)
Creatinine, Ser: 0.84 mg/dL (ref 0.50–1.10)
GFR calc Af Amer: 77 mL/min — ABNORMAL LOW (ref >90)
Potassium: 4.7 mEq/L (ref 3.5–5.1)
Sodium: 118 mEq/L — CL (ref 135–145)

## 2011-05-17 LAB — CBC
HCT: 32.5 % — ABNORMAL LOW (ref 36.0–46.0)
Hemoglobin: 11.3 g/dL — ABNORMAL LOW (ref 12.0–15.0)
MCV: 79.5 fL (ref 78.0–100.0)
Platelets: 223 10*3/uL (ref 150–400)
RBC: 4.09 MIL/uL (ref 3.87–5.11)
WBC: 8.3 10*3/uL (ref 4.0–10.5)

## 2011-05-17 LAB — GLUCOSE, CAPILLARY
Glucose-Capillary: 112 mg/dL — ABNORMAL HIGH (ref 70–99)
Glucose-Capillary: 55 mg/dL — ABNORMAL LOW (ref 70–99)
Glucose-Capillary: 87 mg/dL (ref 70–99)
Glucose-Capillary: 138 mg/dL — ABNORMAL HIGH (ref 70–99)

## 2011-05-17 LAB — STREP PNEUMONIAE URINARY ANTIGEN: Strep Pneumo Urinary Antigen: NEGATIVE

## 2011-05-17 LAB — OSMOLALITY, URINE: Osmolality, Ur: 195 mOsm/kg — ABNORMAL LOW (ref 390–1090)

## 2011-05-17 LAB — HIV ANTIBODY (ROUTINE TESTING W REFLEX): HIV: NONREACTIVE

## 2011-05-17 MED ORDER — INSULIN ASPART 100 UNIT/ML ~~LOC~~ SOLN
0.0000 [IU] | Freq: Three times a day (TID) | SUBCUTANEOUS | Status: DC
  Administered 2011-05-17: 3 [IU] via SUBCUTANEOUS
  Administered 2011-05-18: 2 [IU] via SUBCUTANEOUS
  Administered 2011-05-18: 3 [IU] via SUBCUTANEOUS
  Administered 2011-05-18 – 2011-05-19 (×2): 2 [IU] via SUBCUTANEOUS

## 2011-05-17 MED ORDER — GLUCOSE 40 % PO GEL
ORAL | Status: AC
  Administered 2011-05-17: 37.5 g
  Filled 2011-05-17: qty 1

## 2011-05-17 MED ORDER — TUBERCULIN PPD 5 UNIT/0.1ML ID SOLN
5.0000 [IU] | Freq: Once | INTRADERMAL | Status: AC
  Administered 2011-05-17: 5 [IU] via INTRADERMAL
  Filled 2011-05-17 (×4): qty 0.1

## 2011-05-17 MED ORDER — INSULIN ASPART 100 UNIT/ML ~~LOC~~ SOLN: 0.0000 [IU] | Freq: Every day | SUBCUTANEOUS | Status: DC

## 2011-05-17 NOTE — Progress Notes (Signed)
Subjective: Up walking around No new c/o  BS low   Objective: Vital signs in last 24 hours: Filed Vitals:   05/16/11 2229 05/16/11 2351 05/17/11 0500 05/17/11 1309  BP: 146/62  117/85 158/67  Pulse: 62  57 68  Temp: 97.7 F (36.5 C)  96.7 F (35.9 C) 97.9 F (36.6 C)  TempSrc: Oral  Oral Oral  Resp: 18  18 18   Height: 4\' 11"  (1.499 m)     Weight: 71.4 kg (157 lb 6.5 oz)     SpO2: 99% 99% 95% 95%   Weight change:   Intake/Output Summary (Last 24 hours) at 05/17/11 1410 Last data filed at 05/17/11 0759  Gross per 24 hour  Intake    700 ml  Output    850 ml  Net   -150 ml    Physical Exam: General: Awake, Oriented, No acute distress. HEENT: EOMI. Neck: Supple CV: S1 and S2, rrr Lungs: Clear to ascultation bilaterally, no wheezing Abdomen: Soft, Nontender, Nondistended, +bowel sounds. Ext: Good pulses. Trace edema.  Lab Results:  [facility] 05/17/11 0025 05/16/11 2117  NA 118* 121*  K 4.7 4.5  CL 88* 87*  CO2 19 23  GLUCOSE 138* 151*  BUN 13 13  CREATININE 0.74 0.80  CALCIUM 9.0 9.2  MG -- --  PHOS -- --    Basename 05/16/11 1248  AST 29  ALT 27  ALKPHOS 74  BILITOT 0.3  PROT 7.3  ALBUMIN 3.7   No results found for this basename: LIPASE:2,AMYLASE:2 in the last 72 hours  Basename 05/17/11 0025 05/16/11 1248  WBC 8.3 9.4  NEUTROABS -- 6.4  HGB 11.3* 12.3  HCT 32.5* 35.1*  MCV 79.5 79.6  PLT 223 255   No results found for this basename: CKTOTAL:3,CKMB:3,CKMBINDEX:3,TROPONINI:3 in the last 72 hours No components found with this basename: POCBNP:3 No results found for this basename: DDIMER:2 in the last 72 hours No results found for this basename: HGBA1C:2 in the last 72 hours No results found for this basename: CHOL:2,HDL:2,LDLCALC:2,TRIG:2,CHOLHDL:2,LDLDIRECT:2 in the last 72 hours  Basename 05/17/11 0025  TSH 1.182  T4TOTAL --  T3FREE --  THYROIDAB --   No results found for this basename:  VITAMINB12:2,FOLATE:2,FERRITIN:2,TIBC:2,IRON:2,RETICCTPCT:2 in the last 72 hours  Micro Results: No results found for this or any previous visit (from the past 240 hour(s)).  Studies/Results: Dg Chest 2 View  05/16/2011  *RADIOLOGY REPORT*  Clinical Data: Fatigue.  History diabetes hypertension.  CHEST - 2 VIEW 2013:  Comparison: Two-view chest x-ray 10/03/2008, 05/24/2007 [facility], and 08/11/2005 [facility].  Findings: Cardiac silhouette mildly enlarged but stable.  Thoracic aorta mildly tortuous, unchanged.  Hilar and mediastinal contours otherwise unremarkable.  Suboptimal inspiration which accounts for crowded bronchovascular markings diffusely.  Focal opacity in the right perihilar region, localizing to the central right upper lobe on the lateral image.  Lungs otherwise clear.  No pleural effusions.  Degenerative changes involving the thoracic and upper lumbar spine.  IMPRESSION: Focal atelectasis versus pneumonia in the medial right upper lobe, in the right perihilar region.  Follow-up chest x-ray recommended after treatment in order to assure resolution.  Original Report Authenticated By: Arnell Sieving, M.D.    Medications: I have reviewed the patient's current medications. Scheduled Meds:   . aspirin  81 mg Oral Daily  . azithromycin  500 mg Intravenous QHS  . benzonatate  100 mg Oral TID  . cefTRIAXone (ROCEPHIN)  IV  1 g Intravenous Once  . cefTRIAXone (ROCEPHIN)  IV  1 g Intravenous Q24H  . dextrose      . enoxaparin  40 mg Subcutaneous Q24H  . insulin aspart  0-15 Units Subcutaneous TID WC  . insulin aspart  0-5 Units Subcutaneous QHS  . moxifloxacin  400 mg Oral Once  . sodium chloride  1,000 mL Intravenous Once  . tuberculin  5 Units Intradermal Once  . DISCONTD: sodium chloride   Intravenous STAT  . DISCONTD: aspirin  81 mg Oral Daily  . DISCONTD: glipiZIDE  10 mg Oral BID AC   Continuous Infusions:   . DISCONTD: sodium chloride 50 mL/hr  (05/17/11 0000)   PRN Meds:.albuterol, hydrALAZINE, meclizine  Assessment/Plan: Principal Problem:  *Hyponatremia- worse with fluid restriction   DIABETES MELLITUS, TYPE II- SSI, was on glucotrol but made patient become hypoglycemic   Dizziness- resolved patient up walking around   Hyperkalemia- resolved   HTN (hypertension)   UTI (lower urinary tract infection)- abx   Community acquired pneumonia- abx  ? Primary adrenal insufficiency- ACTH/cortisol/PPD- consider CT scan of abdomen to r/o hemorrhage if lab tests are positive    LOS: 1 day  Nelly Scriven, DO 05/17/2011, 2:10 PM

## 2011-05-17 NOTE — Progress Notes (Signed)
PT Cancellation Note  Treatment cancelled today due to pt at baseline functional level. Pt at sink independently brushing her teeth upon arrival. Spoke with daughter and pt through daughter. Pt is at her baseline functional level and they do not feel as though PT is necessary, RN agreed. Will not follow acutely. Please reorder if necessary.  Thanks, 05/17/2011 Milana Kidney DPT PAGER: [phone] OFFICE: [phone]    Milana Kidney 05/17/2011, 4:36 PM

## 2011-05-17 NOTE — Progress Notes (Signed)
CBG: 40  Treatment: 1 tube instant glucose and 15 GM carbohydrate snack  Symptoms: None  Follow-up CBG: Time:1342 CBG Result:87  Possible Reasons for Event: Unknown  Comments/MD notified:glucotrol D/C    Stephaniemarie Stoffel, Sylvie Farrier

## 2011-05-17 NOTE — Progress Notes (Signed)
Pharmacy Consult - Renal Adjustment of Antibiotics  77 yo F presented with generalized weakness, nonproductive cough, SOB, and dizziness for the past week. Pt was seen at urgent care 2d ago and dx benign paroxysmal positional vertigo and started Meclizine. Labs there showed hyponatremia and hypokalemia and pt referred to ED.  Pt was started on Rocephin/Azithro day #1/7 for empiric CAP/UTI coverage.  Cx pending.  The patient's SCr =  0.7, with a calculated CrCl ~ 53.  Rocephin and Azithromycin do not require renal adjustment.  Medication doses are appropriate.  Anticipate no further adjustments needed.  Pharmacy will sign off.  [employer], Pharm.D., BCPS Clinical Pharmacist Pager [phone]  05/17/2011 8:14 AM

## 2011-05-17 NOTE — Progress Notes (Signed)
CBG: 55  Treatment: 15 GM carbohydrate snack  Symptoms: None  Follow-up CBG: Time: CBG Result:40  Possible Reasons for Event: Unknown  Comments/MD notified:Giving another snack- patient alert and talking    Noell Shular, Sylvie Farrier

## 2011-05-18 LAB — BASIC METABOLIC PANEL
BUN: 11 mg/dL (ref 6–23)
Chloride: 94 mEq/L — ABNORMAL LOW (ref 96–112)
Creatinine, Ser: 0.76 mg/dL (ref 0.50–1.10)
GFR calc Af Amer: >90 mL/min (ref >90)
GFR calc non Af Amer: 81 mL/min — ABNORMAL LOW (ref >90)

## 2011-05-18 MED ORDER — COSYNTROPIN 0.25 MG IJ SOLR
0.2500 mg | Freq: Once | INTRAMUSCULAR | Status: AC
  Administered 2011-05-18: 0.25 mg via INTRAVENOUS
  Filled 2011-05-18: qty 0.25

## 2011-05-18 MED ORDER — LEVOFLOXACIN 500 MG PO TABS
500.0000 mg | ORAL_TABLET | Freq: Every day | ORAL | Status: DC
  Administered 2011-05-18 – 2011-05-19 (×2): 500 mg via ORAL
  Filled 2011-05-18 (×2): qty 1

## 2011-05-18 NOTE — Progress Notes (Signed)
Subjective: Up walking around No new c/o - dizziness resolved    Objective: Vital signs in last 24 hours: Filed Vitals:   05/17/11 0500 05/17/11 1309 05/17/11 2100 05/18/11 0503  BP: 117/85 158/67 138/58 132/61  Pulse: 57 68 69 64  Temp: 96.7 F (35.9 C) 97.9 F (36.6 C) 97.5 F (36.4 C) 97.3 F (36.3 C)  TempSrc: Oral Oral Oral Oral  Resp: 18 18 18 19   Height:      Weight:      SpO2: 95% 95% 95% 95%   Weight change:   Intake/Output Summary (Last 24 hours) at 05/18/11 1218 Last data filed at 05/18/11 0500  Gross per 24 hour  Intake    320 ml  Output    900 ml  Net   -580 ml    Physical Exam: General: Awake, Oriented, No acute distress. HEENT: EOMI. Neck: Supple CV: S1 and S2, rrr Lungs: Clear to ascultation bilaterally, no wheezing Abdomen: Soft, Nontender, Nondistended, +bowel sounds. Ext: Good pulses. Trace edema.  Lab Results:  [facility] 05/18/11 0540 05/17/11 1955  NA 126* 121*  K 4.2 4.4  CL 94* 90*  CO2 23 19  GLUCOSE 173* 263*  BUN 11 14  CREATININE 0.76 0.84  CALCIUM 8.8 9.0  MG -- --  PHOS -- --    Basename 05/16/11 1248  AST 29  ALT 27  ALKPHOS 74  BILITOT 0.3  PROT 7.3  ALBUMIN 3.7   No results found for this basename: LIPASE:2,AMYLASE:2 in the last 72 hours  Basename 05/17/11 0025 05/16/11 1248  WBC 8.3 9.4  NEUTROABS -- 6.4  HGB 11.3* 12.3  HCT 32.5* 35.1*  MCV 79.5 79.6  PLT 223 255   No results found for this basename: CKTOTAL:3,CKMB:3,CKMBINDEX:3,TROPONINI:3 in the last 72 hours No components found with this basename: POCBNP:3 No results found for this basename: DDIMER:2 in the last 72 hours No results found for this basename: HGBA1C:2 in the last 72 hours No results found for this basename: CHOL:2,HDL:2,LDLCALC:2,TRIG:2,CHOLHDL:2,LDLDIRECT:2 in the last 72 hours  Basename 05/17/11 0025  TSH 1.182  T4TOTAL --  T3FREE --  THYROIDAB --   No results found for this basename:  VITAMINB12:2,FOLATE:2,FERRITIN:2,TIBC:2,IRON:2,RETICCTPCT:2 in the last 72 hours  Micro Results: Recent Results (from the past 240 hour(s))  CULTURE, BLOOD (ROUTINE X 2)     Status: Normal (Preliminary result)   Collection Time   05/17/11 12:20 AM      Component Value Range Status Comment   Specimen Description BLOOD RIGHT HAND   Final    Special Requests BOTTLES DRAWN AEROBIC AND ANAEROBIC 10CC   Final    Culture  Setup Time [id number]   Final    Culture     Final    Value:        BLOOD CULTURE RECEIVED NO GROWTH TO DATE CULTURE WILL BE HELD FOR 5 DAYS BEFORE ISSUING A FINAL NEGATIVE REPORT   Report Status PENDING   Incomplete   CULTURE, BLOOD (ROUTINE X 2)     Status: Normal (Preliminary result)   Collection Time   05/17/11 12:30 AM      Component Value Range Status Comment   Specimen Description BLOOD LEFT HAND   Final    Special Requests BOTTLES DRAWN AEROBIC ONLY 5CC   Final    Culture  Setup Time [id number]   Final    Culture     Final    Value:        BLOOD CULTURE RECEIVED NO GROWTH TO  DATE CULTURE WILL BE HELD FOR 5 DAYS BEFORE ISSUING A FINAL NEGATIVE REPORT   Report Status PENDING   Incomplete     Studies/Results: Dg Chest 2 View  05/16/2011  *RADIOLOGY REPORT*  Clinical Data: Fatigue.  History diabetes hypertension.  CHEST - 2 VIEW 2013:  Comparison: Two-view chest x-ray 10/03/2008, 05/24/2007 [facility], and 08/11/2005 [facility].  Findings: Cardiac silhouette mildly enlarged but stable.  Thoracic aorta mildly tortuous, unchanged.  Hilar and mediastinal contours otherwise unremarkable.  Suboptimal inspiration which accounts for crowded bronchovascular markings diffusely.  Focal opacity in the right perihilar region, localizing to the central right upper lobe on the lateral image.  Lungs otherwise clear.  No pleural effusions.  Degenerative changes involving the thoracic and upper lumbar spine.  IMPRESSION: Focal atelectasis versus pneumonia in the medial  right upper lobe, in the right perihilar region.  Follow-up chest x-ray recommended after treatment in order to assure resolution.  Original Report Authenticated By: Arnell Sieving, M.D.    Medications: I have reviewed the patient's current medications. Scheduled Meds:    . aspirin  81 mg Oral Daily  . azithromycin  500 mg Intravenous QHS  . benzonatate  100 mg Oral TID  . cefTRIAXone (ROCEPHIN)  IV  1 g Intravenous Q24H  . cosyntropin  0.25 mg Intravenous Once  . dextrose      . enoxaparin  40 mg Subcutaneous Q24H  . insulin aspart  0-5 Units Subcutaneous QHS  . insulin aspart  0-9 Units Subcutaneous TID WC  . tuberculin  5 Units Intradermal Once  . DISCONTD: glipiZIDE  10 mg Oral BID AC  . DISCONTD: insulin aspart  0-15 Units Subcutaneous TID WC  . 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

## 2011-05-18 NOTE — Progress Notes (Addendum)
Adrenal lab base drawn. Med given [phone]. Lab notified of next draw at 0854. Have not arrived yet at 0927. Called 2nd time are on way. Called  Lab AGAIN AT [phone]. THEY ARE ON Way. Notified dr Benjamine Mola. Will come to see pt no order to restart test

## 2011-05-19 LAB — URINE CULTURE
Colony Count: >=100000
Culture  Setup Time: 201303221556

## 2011-05-19 LAB — BASIC METABOLIC PANEL
BUN: 9 mg/dL (ref 6–23)
CO2: 23 mEq/L (ref 19–32)
Calcium: 9 mg/dL (ref 8.4–10.5)
Creatinine, Ser: 0.72 mg/dL (ref 0.50–1.10)
GFR calc non Af Amer: 83 mL/min — ABNORMAL LOW (ref >90)
Glucose, Bld: 215 mg/dL — ABNORMAL HIGH (ref 70–99)

## 2011-05-19 MED ORDER — LEVOFLOXACIN 500 MG PO TABS: 500.0000 mg | ORAL_TABLET | Freq: Every day | ORAL | Status: DC

## 2011-05-19 MED ORDER — LEVOFLOXACIN 500 MG PO TABS: 500.0000 mg | ORAL_TABLET | Freq: Every day | ORAL | Status: AC

## 2011-05-19 NOTE — Discharge Summary (Signed)
Discharge Summary  Joyce Gilmore MR#: [id number]  DOB:04-01-1934  Date of Admission: 05/16/2011 Date of Discharge: 05/19/2011  Patient's PCP: Norberto Sorenson, MD, MD  Attending Physician:Kier Smead   Discharge Diagnoses: Principal Problem:  *Hyponatremia Active Problems:  DIABETES MELLITUS, TYPE II  Dizziness  Hyperkalemia  HTN (hypertension)  UTI (lower urinary tract infection)  Community acquired pneumonia   Brief Admitting History and Physical Joyce Gilmore is a 77 y.o. hispanic female presented to the ED accompanied by her daughter for a lab recheck. History was obtained through her daughter via an interpreter phone. Patient has been complaining of increased generalized weakness, nonproductive cough, sob, and dizziness for the past week. Her dizziness is intermittent, worsening with positional change, and lasting for minutes to hours. Cough is nonproductive, with no associated CP. Denies hemoptysis. SOB only on exertion. She presents at urgent care 2 days ago for evaluation. She was diagnosed with benign paroxysmal positional vertigo, and was given meclizine. Lab work was obtained which shows evidence of hyponatremia and hyperkalemia.- they notified her to go to the ER. Patient has a remote history of nonfunctioning right kidney per daughter. Patient states the fatigue has been gradual in onset, and consistence. Patient complaining of being out of breath with exertion. She has no appetite according to her daughter she does not drink enough fluid. Patient occasionally complaining of mild headache to the back of her head, but denies double vision, sore throat, sneezing, coughing, nausea, vomiting, diarrhea, and abdominal pain. Denies calf pain or lower extremity swelling. No increased risk of breath when laying down. Patient denies dysuria, or rash. No medication changes except discontinue her sleeping medications amitriptyline. Daughter noticed that the blood pressures been high  for the past week.   Discharge Medications Medication List  As of 05/19/2011 10:35 AM   STOP taking these medications         glipiZIDE 10 MG tablet      hydrochlorothiazide 12.5 MG tablet      metoprolol tartrate 25 MG tablet         TAKE these medications         alendronate 70 MG tablet   Commonly known as: FOSAMAX   Take 70 mg by mouth every 7 (seven) days. Take with a full glass of water on an empty stomach.      aspirin 81 MG tablet   Take 81 mg by mouth daily.      benzonatate 100 MG capsule   Commonly known as: TESSALON   Take 100 mg by mouth every 8 (eight) hours. cough      cetirizine 10 MG tablet   Commonly known as: ZYRTEC   Take 10 mg by mouth daily.      levofloxacin 500 MG tablet   Commonly known as: LEVAQUIN   Take 1 tablet (500 mg total) by mouth daily.      lisinopril 40 MG tablet   Commonly known as: PRINIVIL,ZESTRIL   Take 40 mg by mouth daily.      meclizine 12.5 MG tablet   Commonly known as: ANTIVERT   Take 12.5 mg by mouth 3 (three) times daily as needed. dizziness      metFORMIN 1000 MG tablet   Commonly known as: GLUCOPHAGE   Take 1,000 mg by mouth 2 (two) times daily with a meal.      metoprolol succinate 25 MG 24 hr tablet   Commonly known as: TOPROL-XL   Take 25 mg by mouth daily.  oyster calcium 500 MG Tabs   Take 500 mg of elemental calcium by mouth 2 (two) times daily.      pravastatin 40 MG tablet   Commonly known as: PRAVACHOL   Take 40 mg by mouth daily.      sitaGLIPtin 100 MG tablet   Commonly known as: JANUVIA   Take 100 mg by mouth daily.            Hospital Course: Hyponatremia ?SIADH- worse with fluid, better with restriction - D/C HCTZ DIABETES MELLITUS, TYPE II- SSI, was on glucotrol but made patient become hypoglycemic, hold this medication but continue others  Dizziness- resolved patient up walking around  Hyperkalemia- resolved  HTN (hypertension)- D/C HCTZ but continue others, well controlled    UTI (lower urinary tract infection)- levaquin 7 days (e coli) Community acquired pneumonia- levaquin 7 days       Day of Discharge BP 124/74  Pulse 60  Temp(Src) 97.5 F (36.4 C) (Oral)  Resp 18  Ht 4\' 11"  (1.499 m)  Wt 71.4 kg (157 lb 6.5 oz)  BMI 31.79 kg/m2  SpO2 97%  Results for orders placed during the hospital encounter of 05/16/11 (from the past 48 hour(s))  GLUCOSE, CAPILLARY     Status: Abnormal   Collection Time   05/17/11 12:48 PM      Component Value Range Comment   Glucose-Capillary 55 (*) 70 - 99 (mg/dL)   GLUCOSE, CAPILLARY     Status: Abnormal   Collection Time   05/17/11  1:08 PM      Component Value Range Comment   Glucose-Capillary 40 (*) 70 - 99 (mg/dL)   GLUCOSE, CAPILLARY     Status: Normal   Collection Time   05/17/11  1:42 PM      Component Value Range Comment   Glucose-Capillary 87  70 - 99 (mg/dL)   CORTISOL     Status: Normal   Collection Time   05/17/11  3:24 PM      Component Value Range Comment   Cortisol, Plasma 9.8     GLUCOSE, CAPILLARY     Status: Abnormal   Collection Time   05/17/11  5:23 PM      Component Value Range Comment   Glucose-Capillary 216 (*) 70 - 99 (mg/dL)   BASIC METABOLIC PANEL     Status: Abnormal   Collection Time   05/17/11  7:55 PM      Component Value Range Comment   Sodium 121 (*) 135 - 145 (mEq/L)    Potassium 4.4  3.5 - 5.1 (mEq/L)    Chloride 90 (*) 96 - 112 (mEq/L)    CO2 19  19 - 32 (mEq/L)    Glucose, Bld 263 (*) 70 - 99 (mg/dL)    BUN 14  6 - 23 (mg/dL)    Creatinine, Ser 4.09  0.50 - 1.10 (mg/dL)    Calcium 9.0  8.4 - 10.5 (mg/dL)    GFR calc non Af Amer 67 (*) >90 (mL/min)    GFR calc Af Amer 77 (*) >90 (mL/min)   GLUCOSE, CAPILLARY     Status: Abnormal   Collection Time   05/17/11  9:30 PM      Component Value Range Comment   Glucose-Capillary 138 (*) 70 - 99 (mg/dL)   BASIC METABOLIC PANEL     Status: Abnormal   Collection Time   05/18/11  5:40 AM      Component Value Range Comment    Sodium 126 (*)  135 - 145 (mEq/L)    Potassium 4.2  3.5 - 5.1 (mEq/L)    Chloride 94 (*) 96 - 112 (mEq/L)    CO2 23  19 - 32 (mEq/L)    Glucose, Bld 173 (*) 70 - 99 (mg/dL)    BUN 11  6 - 23 (mg/dL)    Creatinine, Ser 3.08  0.50 - 1.10 (mg/dL)    Calcium 8.8  8.4 - 10.5 (mg/dL)    GFR calc non Af Amer 81 (*) >90 (mL/min)    GFR calc Af Amer >90  >90 (mL/min)   GLUCOSE, CAPILLARY     Status: Abnormal   Collection Time   05/18/11  7:44 AM      Component Value Range Comment   Glucose-Capillary 181 (*) 70 - 99 (mg/dL)   GLUCOSE, CAPILLARY     Status: Abnormal   Collection Time   05/18/11 12:03 PM      Component Value Range Comment   Glucose-Capillary 223 (*) 70 - 99 (mg/dL)   GLUCOSE, CAPILLARY     Status: Abnormal   Collection Time   05/18/11  5:38 PM      Component Value Range Comment   Glucose-Capillary 163 (*) 70 - 99 (mg/dL)   GLUCOSE, CAPILLARY     Status: Abnormal   Collection Time   05/18/11  9:27 PM      Component Value Range Comment   Glucose-Capillary 181 (*) 70 - 99 (mg/dL)   BASIC METABOLIC PANEL     Status: Abnormal   Collection Time   05/19/11  7:19 AM      Component Value Range Comment   Sodium 130 (*) 135 - 145 (mEq/L)    Potassium 4.5  3.5 - 5.1 (mEq/L)    Chloride 98  96 - 112 (mEq/L)    CO2 23  19 - 32 (mEq/L)    Glucose, Bld 215 (*) 70 - 99 (mg/dL)    BUN 9  6 - 23 (mg/dL)    Creatinine, Ser 6.57  0.50 - 1.10 (mg/dL)    Calcium 9.0  8.4 - 10.5 (mg/dL)    GFR calc non Af Amer 83 (*) >90 (mL/min)    GFR calc Af Amer >90  >90 (mL/min)   GLUCOSE, CAPILLARY     Status: Abnormal   Collection Time   05/19/11  8:35 AM      Component Value Range Comment   Glucose-Capillary 183 (*) 70 - 99 (mg/dL)     Dg Chest 2 View  8/46/9629  *RADIOLOGY REPORT*  Clinical Data: Fatigue.  History diabetes hypertension.  CHEST - 2 VIEW 2013:  Comparison: Two-view chest x-ray 10/03/2008, 05/24/2007 [facility], and 08/11/2005 [facility]  Findings: Cardiac  silhouette mildly enlarged but stable.  Thoracic aorta mildly tortuous, unchanged.  Hilar and mediastinal contours otherwise unremarkable.  Suboptimal inspiration which accounts for crowded bronchovascular markings diffusely.  Focal opacity in the right perihilar region, localizing to the central right upper lobe on the lateral image.  Lungs otherwise clear.  No pleural effusions.  Degenerative changes involving the thoracic and upper lumbar spine.  IMPRESSION: Focal atelectasis versus pneumonia in the medial right upper lobe, in the right perihilar region.  Follow-up chest x-ray recommended after treatment in order to assure resolution.  Original Report Authenticated By: Arnell Sieving, M.D.     Disposition: home  Diet: diabetic  Activity: as tolerated   Follow-up Appts: PCP- 1 week for repeat BMP Discharge Orders    Future  Orders Please Complete By Expires   Diet Carb Modified      Increase activity slowly      Discharge instructions      Comments:   STOP HCTZ Fluid restrict to 1.5L/daily       TESTS THAT NEED FOLLOW-UP Repeat BMP at PCP  Time spent on discharge, talking to the patient, and coordinating care: 50 mins.   SignedMarlin Canary, DO 05/19/2011, 10:35 AM

## 2011-05-19 NOTE — Discharge Instructions (Signed)
Sndrome de hiponatremia dilucional  (Dilutional Hyponatremia)  El SSIHA (sndrome de secrecin inadecuada de hormona antidiurtica) es una causa de bajo nivel de sodio en la sangre (hiponatremia). La hormona antidiurtica (ADH) es liberada por una parte del cerebro para ayudar a que el cuerpo Chemical engineer. El cuerpo [employer] agua y el sodio balanceados. Cuando hay ms ADH (hormona antidiurtica), el cuerpo retiene el agua, la cual [place]. Sucede un desbalance y aparece la hiponatremia.  CAUSAS  La causas ms frecuentes son:  Enfermedades del cerebro y el sistema nervioso central (como una lesin en el cerebro, choque o meningitis).  Cncer (en especial el cncer pulmonar de clulas pequeas y otros).  Enfermedades de los pulmones (entre las que se incluyen fallas pulmonares, neumona o tuberculosis).  Medicamentos.  El SSIHA puede aparecer con [employer].  SNTOMAS  Cuando los niveles de sodio estn bajos, las clulas tienden a Environmental health practitioner agua por dems e hincharse. La hinchazn aparece en todo el cuerpo, pero afecta mayormente al cerebro. Una inflamacin cerebral grave (edema cerebral) puede causar muerte y suceder con una aparicin rpida de hiponatremia (que se desarrolla en 48 horas o menos). Tambin puede haber convulsiones o coma. La hiponatremia menos grave puede ocasionar:  Programme researcher, broadcasting/film/video (nuseas) y vmitos.  Dificultad para Counsellor.  Confusin.  Babs [place]ara.  Agitacin.  Dolor de [place].  Convulsiones.  Anorexia (prdida del apetito).  Debilidad muscular y calambres.  Durante el SSIHA el cuerpo podr acostumbrarse a Warehouse manager un bajo nivel de [place].  DIAGNSTICO  La hiponatremia se identifica con un simple anlisis de sangre. El Occupational psychologist un historial y examen fsico para Best boy la causa y el tipo de hiponatremia. Podrn necesitarse otras pruebas para medir la cantidad de sodio en la sangre y [place].    TRATAMIENTO  El tratamiento depende de la causa.  Si el sodio est muy bajo, se administrarn fluidos por va venosa. Tambin podrn utilizarse medicamentos para comenzar a Animator. Si los medicamentos estn ocasionando este problema, Sport and exercise psychologist prescripcin.  La restriccin de agua o consumo de lquidos suele ser necesaria para volver a Tour manager.  La velocidad en la correccin del problema de sodio es muy importante. Si el problema se corrige muy rpido, puede haber un dao neurolgico (a veces definitivo).  INSTRUCCIONES PARA EL CUIDADO DOMICILIARIO  Tome los [employer] indic el profesional que lo asiste. Muchos medicamentos pueden hacer que la hiponatremia empeore. Asegrese de Audiological scientist al [employer] todos los medicamentos que consuma.  Siga cuidadosamente cualquier dieta que se le recomiende, incluidas la restricciones de lquidos.  Se le pedir que repita los anlisis. Asegrese de cumplir con las indicaciones.  El mdico hablar con usted acerca del tratamiento sobre cualquier enfermedad subyacente que haya causado [place].  SOLICITE ANTENCIN MDICA SI:  Siente nuseas que empeoran, fatiga, dolor de cabeza, confusin o debilitamiento.  Siente los sntomas de la hiponatremia nuevamente.  Tiene problemas para seguir la dieta que se le ha recomendado.  SOLICITE ATENCIN MDICA DE INMEDIATO SI:  Tiene temblores que no puede controlar (convulsiones).  Pierde la conciencia (se desvanece).  Presenta diarrea o vmitos persistentes.  EST SEGURO QUE:  Comprende las instrucciones para el alta mdica.  Controlar su enfermedad.  Solicitar atencin mdica de inmediato segn las indicaciones.

## 2011-05-19 NOTE — Progress Notes (Signed)
   CARE MANAGEMENT NOTE 05/19/2011  Patient:  Joyce Gilmore, Joyce Gilmore   Account Number:  [id number]  Date Initiated:  05/19/2011  Documentation initiated by:  Donn Pierini  Subjective/Objective Assessment:   Pt admitted with hyponatremia     Action/Plan:   PTA pt lived at home with family   Anticipated DC Date:  05/19/2011   Anticipated DC Plan:  HOME/SELF CARE      DC Planning Services  CM consult      Choice offered to / List presented to:             Status of service:  Completed, signed off Medicare Important Message given?   (If response is "NO", the following Medicare IM given date fields will be blank) Date Medicare IM given:   Date Additional Medicare IM given:    Discharge Disposition:  HOME/SELF CARE  Per UR Regulation:    If discussed at Long Length of Stay Meetings, dates discussed:    Comments:  PCP- Norberto Sorenson  05/19/11- 1200- Donn Pierini RN, BSN [phone] Pt for discharge today, no discharge needs noted pt to return home with family.

## 2011-05-19 NOTE — Progress Notes (Signed)
Used the interpreter Jonetta Speak [phone]) to go over the discharge instructions. Pt did not have any further questions after discharge information was shared.

## 2011-05-23 LAB — CULTURE, BLOOD (ROUTINE X 2)
Culture  Setup Time: 201303231147
Culture: NO GROWTH

## 2011-10-23 ENCOUNTER — Emergency Department (INDEPENDENT_AMBULATORY_CARE_PROVIDER_SITE_OTHER)
Admission: EM | Admit: 2011-10-23 | Discharge: 2011-10-23 | Disposition: A | Discharge status: Home / Self Care | Payer: Medicaid Other | Source: Home / Self Care | Attending: Family Medicine | Admitting: Family Medicine

## 2011-10-23 ENCOUNTER — Encounter (HOSPITAL_COMMUNITY): Payer: Self-pay | Admitting: *Deleted

## 2011-10-23 DIAGNOSIS — Z760 Encounter for issue of repeat prescription: Secondary | ICD-10-CM

## 2011-10-23 DIAGNOSIS — N390 Urinary tract infection, site not specified: Secondary | ICD-10-CM

## 2011-10-23 LAB — POCT URINALYSIS DIP (DEVICE)
Bilirubin Urine: NEGATIVE
Ketones, ur: NEGATIVE mg/dL
Specific Gravity, Urine: 1.015 (ref 1.005–1.030)
pH: 7 (ref 5.0–8.0)

## 2011-10-23 LAB — POCT I-STAT, CHEM 8
BUN: 11 mg/dL (ref 6–23)
Chloride: 96 mEq/L (ref 96–112)
Creatinine, Ser: 0.9 mg/dL (ref 0.50–1.10)
Hemoglobin: 13.6 g/dL (ref 12.0–15.0)
Potassium: 4.8 mEq/L (ref 3.5–5.1)
Sodium: 133 mEq/L — ABNORMAL LOW (ref 135–145)

## 2011-10-23 MED ORDER — CETIRIZINE HCL 10 MG PO TABS: 10.0000 mg | ORAL_TABLET | Freq: Every day | ORAL | Status: DC

## 2011-10-23 MED ORDER — CEPHALEXIN 500 MG PO CAPS: 500.0000 mg | ORAL_CAPSULE | Freq: Two times a day (BID) | ORAL | Status: AC

## 2011-10-23 MED ORDER — GLIPIZIDE 10 MG PO TABS: 10.0000 mg | ORAL_TABLET | Freq: Two times a day (BID) | ORAL | Status: DC

## 2011-10-23 MED ORDER — TRAMADOL HCL 50 MG PO TABS: 50.0000 mg | ORAL_TABLET | Freq: Four times a day (QID) | ORAL | Status: DC | PRN

## 2011-10-23 NOTE — ED Notes (Signed)
Caregiver   Reports         Pt  Is  Out  Of  Some  Of  Her  meds        Glucose         Was in   The  [address]  Yesterday           She  Ambulated  To  Room  Yesterday  With a  Slow  Steady  Gait         She  Also  Reports  Some  Low  Back pain as  Well  And  She  denys  Any  injury

## 2011-10-23 NOTE — ED Provider Notes (Signed)
History     CSN: [id number]  Arrival date & time 10/23/11  1521   First MD Initiated Contact with Patient 10/23/11 1538      Chief Complaint  Patient presents with  . Medication Refill    (Consider location/radiation/quality/duration/timing/severity/associated sxs/prior treatment) The history is provided by the patient and a relative. The history is limited by a language barrier. A language interpreter was used.  Janeese Mcgloin is a 77 y.o. female who would like a refill of all of her routine medications.  There is a known history of diabetes, hypertension and high cholesterol.  She will run out of her medication this month.  Primary care provider was at [facility].  Unable to get an appointment related to facility closing.  She has attempted to transfer to another office but unable to get an appointment.      Past Medical History  Diagnosis Date  . Hypertension   . Diabetes mellitus     History reviewed. No pertinent past surgical history.  History reviewed. No pertinent family history.  History  Substance Use Topics  . Smoking status: Never Smoker   . Smokeless tobacco: Not on file  . Alcohol Use: Not on file    OB History    Grav Para Term Preterm Abortions TAB SAB Ect Mult Living                  Review of Systems  All other systems reviewed and are negative.    Allergies  Review of patient's allergies indicates no known allergies.  Home Medications   Current Outpatient Rx  Name Route Sig Dispense Refill  . ALENDRONATE SODIUM 70 MG PO TABS Oral Take 70 mg by mouth every 7 (seven) days. Take with a full glass of water on an empty stomach.    . ASPIRIN 81 MG PO TABS Oral Take 81 mg by mouth daily.    . CEPHALEXIN 500 MG PO CAPS Oral Take 1 capsule (500 mg total) by mouth 2 (two) times daily. 20 capsule 0  . CETIRIZINE HCL 10 MG PO TABS Oral Take 1 tablet (10 mg total) by mouth daily. 30 tablet 1  . GLIPIZIDE 10 MG PO TABS Oral Take 1 tablet (10 mg  total) by mouth 2 (two) times daily before a meal. 30 tablet 1  . LISINOPRIL 40 MG PO TABS Oral Take 40 mg by mouth daily.    Marland Kitchen METFORMIN HCL 1000 MG PO TABS Oral Take 1,000 mg by mouth 2 (two) times daily with a meal.    . METOPROLOL SUCCINATE ER 25 MG PO TB24 Oral Take 25 mg by mouth daily.    Jeralyn Bennett CALCIUM 500 MG PO TABS Oral Take 500 mg of elemental calcium by mouth 2 (two) times daily.    Marland Kitchen PRAVASTATIN SODIUM 40 MG PO TABS Oral Take 40 mg by mouth daily.    Marland Kitchen SITAGLIPTIN PHOSPHATE 100 MG PO TABS Oral Take 100 mg by mouth daily.    . TRAMADOL HCL 50 MG PO TABS Oral Take 1 tablet (50 mg total) by mouth every 6 (six) hours as needed. 30 tablet 1    BP 158/60  Pulse 69  Temp 98.5 F (36.9 C)  Resp 16  SpO2 100%  Physical Exam  Nursing note and vitals reviewed. Constitutional: She is oriented to person, place, and time. Vital signs are normal. She appears well-developed and well-nourished. She is active and cooperative.  HENT:  Head: Normocephalic.  Mouth/Throat: Oropharynx is  clear and moist.  Eyes: Conjunctivae are normal. Pupils are equal, round, and reactive to light. No scleral icterus.  Neck: Trachea normal and normal range of motion. Neck supple. No thyromegaly present.  Cardiovascular: Normal rate, regular rhythm, normal heart sounds and intact distal pulses.   No murmur heard. Pulmonary/Chest: Effort normal and breath sounds normal. No respiratory distress.  Abdominal: Soft. Bowel sounds are normal. There is no tenderness.  Lymphadenopathy:    She has no cervical adenopathy.  Neurological: She is alert and oriented to person, place, and time. No cranial nerve deficit or sensory deficit. Coordination normal.  Skin: Skin is warm and dry. No rash noted.  Psychiatric: She has a normal mood and affect. Her speech is normal and behavior is normal. Judgment and thought content normal. Cognition and memory are normal.    ED Course  Procedures (including critical care  time)  Labs Reviewed  POCT I-STAT, CHEM 8 - Abnormal; Notable for the following:    Sodium 133 (*)     Glucose, Bld 122 (*)     All other components within normal limits  POCT URINALYSIS DIP (DEVICE) - Abnormal; Notable for the following:    Hgb urine dipstick TRACE (*)     Nitrite POSITIVE (*)     Leukocytes, UA LARGE (*)  Biochemical Testing Only. Please order routine urinalysis from main lab if confirmatory testing is needed.   All other components within normal limits  URINE CULTURE   No results found.   1. Encounter for medication refill   2. UTI (lower urinary tract infection)       MDM  Antibiotics for uti, await urine culture.  Patient actually have refill that have not expired on most of her medications.  Will refill those that do not, istat 8 and urine in office prior to discharge.  Instructed patient to pick medications up from pharmacy or may use health department for medication assistance program.  Resources provided for primary care providers and instructed to call health connect tomorrow so that they may assist you in obtaining a primary care provider.  RTC as needed        Johnsie Kindred, NP 10/23/11 2011

## 2011-10-25 LAB — URINE CULTURE: Colony Count: >=100000

## 2011-10-26 NOTE — ED Provider Notes (Signed)
Medical screening examination/treatment/procedure(s) were performed by resident physician or non-physician practitioner and as supervising physician I was immediately available for consultation/collaboration.   Mercedez Boule DOUGLAS MD.    Jalaiyah Throgmorton D Elwood Bazinet, MD 10/26/11 1721 

## 2011-10-27 NOTE — ED Notes (Signed)
Urine culture: >100,000 colonies E. Coli. Pt. adequately treated with Keflex. Vassie Moselle 10/27/2011

## 2011-11-05 ENCOUNTER — Other Ambulatory Visit (HOSPITAL_COMMUNITY): Payer: Self-pay | Admitting: Orthopaedic Surgery

## 2011-11-12 ENCOUNTER — Encounter (HOSPITAL_COMMUNITY): Payer: Self-pay | Admitting: Emergency Medicine

## 2011-11-12 DIAGNOSIS — I10 Essential (primary) hypertension: Secondary | ICD-10-CM | POA: Insufficient documentation

## 2011-11-12 DIAGNOSIS — N390 Urinary tract infection, site not specified: Secondary | ICD-10-CM | POA: Insufficient documentation

## 2011-11-12 DIAGNOSIS — E871 Hypo-osmolality and hyponatremia: Secondary | ICD-10-CM | POA: Insufficient documentation

## 2011-11-12 DIAGNOSIS — Z79899 Other long term (current) drug therapy: Secondary | ICD-10-CM | POA: Insufficient documentation

## 2011-11-12 DIAGNOSIS — E119 Type 2 diabetes mellitus without complications: Secondary | ICD-10-CM | POA: Insufficient documentation

## 2011-11-12 LAB — COMPREHENSIVE METABOLIC PANEL
ALT: 23 U/L (ref 0–35)
AST: 27 U/L (ref 0–37)
Albumin: 3.6 g/dL (ref 3.5–5.2)
Alkaline Phosphatase: 90 U/L (ref 39–117)
Chloride: 92 mEq/L — ABNORMAL LOW (ref 96–112)
Potassium: 4.7 mEq/L (ref 3.5–5.1)
Total Bilirubin: 0.2 mg/dL — ABNORMAL LOW (ref 0.3–1.2)

## 2011-11-12 LAB — CBC WITH DIFFERENTIAL/PLATELET
Basophils Absolute: 0 10*3/uL (ref 0.0–0.1)
Basophils Relative: 0 % (ref 0–1)
MCHC: 33.4 g/dL (ref 30.0–36.0)
Neutro Abs: 6.5 10*3/uL (ref 1.7–7.7)
Neutrophils Relative %: 66 % (ref 43–77)
RBC: 4.68 MIL/uL (ref 3.87–5.11)
RDW: 14.1 % (ref 11.5–15.5)

## 2011-11-12 LAB — URINE MICROSCOPIC-ADD ON

## 2011-11-12 LAB — URINALYSIS, ROUTINE W REFLEX MICROSCOPIC
Glucose, UA: 250 mg/dL — AB
Ketones, ur: NEGATIVE mg/dL
Protein, ur: NEGATIVE mg/dL

## 2011-11-12 NOTE — ED Notes (Signed)
PT. REPORTS RIGHT LOW BACK PAIN RADIATING TO RIGHT LOWER ABDOMEN ONSET THIS EVENING WITH NAUSEA , NO VOMITTING OR DIARRHEA , NO FEVER OR DYSURIA . STATES SPINE SURGERY AT LOWER BACK THIS COMING OCT. 11, 2013 BY DR. Rayburn Ma . DENIES FALL OR INJURY. GRANDDAUGHTER TRANSLATING FOR PT.

## 2011-11-13 ENCOUNTER — Emergency Department (HOSPITAL_COMMUNITY)
Admission: EM | Admit: 2011-11-13 | Discharge: 2011-11-13 | Disposition: A | Discharge status: Home / Self Care | Payer: Medicaid Other | Attending: Emergency Medicine | Admitting: Emergency Medicine

## 2011-11-13 DIAGNOSIS — R739 Hyperglycemia, unspecified: Secondary | ICD-10-CM

## 2011-11-13 DIAGNOSIS — E871 Hypo-osmolality and hyponatremia: Secondary | ICD-10-CM

## 2011-11-13 DIAGNOSIS — N390 Urinary tract infection, site not specified: Secondary | ICD-10-CM

## 2011-11-13 MED ORDER — DEXTROSE 5 % IV SOLN
1.0000 g | Freq: Once | INTRAVENOUS | Status: AC
  Administered 2011-11-13: 1 g via INTRAVENOUS
  Filled 2011-11-13: qty 10

## 2011-11-13 MED ORDER — TRAMADOL HCL 50 MG PO TABS: 50.0000 mg | ORAL_TABLET | Freq: Four times a day (QID) | ORAL | Status: DC | PRN

## 2011-11-13 MED ORDER — ONDANSETRON HCL 4 MG/2ML IJ SOLN
4.0000 mg | Freq: Once | INTRAMUSCULAR | Status: AC
  Administered 2011-11-13: 4 mg via INTRAVENOUS
  Filled 2011-11-13: qty 2

## 2011-11-13 MED ORDER — CEPHALEXIN 500 MG PO CAPS: 500.0000 mg | ORAL_CAPSULE | Freq: Four times a day (QID) | ORAL | Status: DC

## 2011-11-13 MED ORDER — SODIUM CHLORIDE 0.9 % IV BOLUS (SEPSIS)
1000.0000 mL | Freq: Once | INTRAVENOUS | Status: AC
  Administered 2011-11-13: 1000 mL via INTRAVENOUS

## 2011-11-13 MED ORDER — MORPHINE SULFATE 4 MG/ML IJ SOLN
4.0000 mg | Freq: Once | INTRAMUSCULAR | Status: AC
  Administered 2011-11-13: 4 mg via INTRAVENOUS
  Filled 2011-11-13: qty 1

## 2011-11-13 NOTE — ED Provider Notes (Signed)
History     CSN: [id number]  Arrival date & time 11/12/11  2044   First MD Initiated Contact with Patient 11/13/11 0050      Chief Complaint  Patient presents with  . Back Pain    (Consider location/radiation/quality/duration/timing/severity/associated sxs/prior treatment) HPI Comments: 77 year old female with a history of urinary infections, diabetes and hypertension who presents with a complaint of right flank pain radiating to the lower abdomen. She states that this pain started this evening around 6:00, has been persistent, associated with nausea but no vomiting diarrhea or rectal bleeding or dysuria. She is scheduled to have repeat right hip surgery by a local orthopedist no family members are unsure why. She has had prior surgery in [place] in the past. She denies fevers chills coughing shortness of breath and has been able to ambulate with minimal difficulty today.  Patient is a 77 y.o. female presenting with back pain. The history is provided by the patient and a relative.  Back Pain     Past Medical History  Diagnosis Date  . Hypertension   . Diabetes mellitus     Past Surgical History  Procedure Date  . Back surgery   . Knee surgery     No family history on file.  History  Substance Use Topics  . Smoking status: Never Smoker   . Smokeless tobacco: Not on file  . Alcohol Use: No    OB History    Grav Para Term Preterm Abortions TAB SAB Ect Mult Living                  Review of Systems  Musculoskeletal: Positive for back pain.  All other systems reviewed and are negative.    Allergies  Review of patient's allergies indicates no known allergies.  Home Medications   Current Outpatient Rx  Name Route Sig Dispense Refill  . CALCIUM CARBONATE 1250 MG PO TABS Oral Take 1 tablet by mouth daily.    Marland Kitchen CETIRIZINE HCL 10 MG PO TABS Oral Take 10 mg by mouth daily.    Marland Kitchen GLIPIZIDE 10 MG PO TABS Oral Take 10 mg by mouth 2 (two) times daily before a meal.    .  LISINOPRIL 40 MG PO TABS Oral Take 40 mg by mouth daily.    . MELOXICAM 7.5 MG PO TABS Oral Take 7.5 mg by mouth 2 (two) times daily.    Marland Kitchen METFORMIN HCL 500 MG PO TABS Oral Take 1,000 mg by mouth 2 (two) times daily with a meal.    . PRAVASTATIN SODIUM 40 MG PO TABS Oral Take 40 mg by mouth daily.    . TRAMADOL HCL 50 MG PO TABS Oral Take 100 mg by mouth every 8 (eight) hours as needed. For pain    . CEPHALEXIN 500 MG PO CAPS Oral Take 1 capsule (500 mg total) by mouth 4 (four) times daily. 40 capsule 0  . TRAMADOL HCL 50 MG PO TABS Oral Take 1 tablet (50 mg total) by mouth every 6 (six) hours as needed for pain. 15 tablet 0    BP 140/56  Pulse 70  Temp 98.3 F (36.8 C) (Oral)  Resp 18  SpO2 100%  Physical Exam  Nursing note and vitals reviewed. Constitutional: She appears well-developed and well-nourished. No distress.  HENT:  Head: Normocephalic and atraumatic.  Mouth/Throat: Oropharynx is clear and moist. No oropharyngeal exudate.  Eyes: Conjunctivae normal and EOM are normal. Pupils are equal, round, and reactive to light. Right eye  exhibits no discharge. Left eye exhibits no discharge. No scleral icterus.  Neck: Normal range of motion. Neck supple. No JVD present. No thyromegaly present.  Cardiovascular: Normal rate, regular rhythm, normal heart sounds and intact distal pulses.  Exam reveals no gallop and no friction rub.   No murmur heard. Pulmonary/Chest: Effort normal and breath sounds normal. No respiratory distress. She has no wheezes. She has no rales.  Abdominal: Soft. Bowel sounds are normal. She exhibits no distension and no mass. There is tenderness.       Mild CVA tenderness on the right, suprapubic tenderness present, no guarding, no masses, no peritoneal signs  Musculoskeletal: Normal range of motion. She exhibits no edema and no tenderness.  Lymphadenopathy:    She has no cervical adenopathy.  Neurological: She is alert. Coordination normal.  Skin: Skin is warm and  dry. No rash noted. No erythema.  Psychiatric: She has a normal mood and affect. Her behavior is normal.    ED Course  Procedures (including critical care time)  Labs Reviewed  COMPREHENSIVE METABOLIC PANEL - Abnormal; Notable for the following:    Sodium 127 (*)     Chloride 92 (*)     Glucose, Bld 246 (*)     Total Bilirubin 0.2 (*)     GFR calc non Af Amer 83 (*)     All other components within normal limits  URINALYSIS, ROUTINE W REFLEX MICROSCOPIC - Abnormal; Notable for the following:    APPearance CLOUDY (*)     Glucose, UA 250 (*)     Hgb urine dipstick TRACE (*)     Leukocytes, UA LARGE (*)     All other components within normal limits  URINE MICROSCOPIC-ADD ON - Abnormal; Notable for the following:    Bacteria, UA MANY (*)     All other components within normal limits  CBC WITH DIFFERENTIAL  URINE CULTURE   No results found.   1. UTI (urinary tract infection)   2. Hyponatremia   3. Hyperglycemia       MDM  The patient is ambulated without any difficulty, has abdominal tenderness and flank tenderness consistent with a urinary infection, she has no fever and no leukocytosis, possible early pyelonephritis. IV fluids and IV medications given, hyponatremia treated with IV fluids, patient and family aware of this finding and will followup. They have a scheduled followup with her doctor in less than one week. Is is at [facility]  Urinalysis reveals urinary tract infection, culture sent, mild hyponatremia, patient stable on her feet ambulates without difficulty and tolerated eating and drinking without vomiting. Rocephin given intravenously, IV fluid bolus, home with Keflex and tramadol. She has followup this week with her family Dr., I have recommended 48-hour followup, understanding by family members and patient expressed.     Vida Roller, MD 11/13/11 [phone]

## 2011-11-13 NOTE — ED Notes (Signed)
Pt reporting lower abd pain starting around 5pm. Pain radiates around to right abdomen. Pain is sharp in nature and comes and goes. Denies urinary sx. Does report some nausea but no vomiting or diarrhea. Pt is spanish speaking. Granddaughter translating for pt.

## 2011-11-15 LAB — URINE CULTURE: Colony Count: >=100000

## 2011-11-16 NOTE — ED Notes (Signed)
+  Urine. Patient treated with Keflex. Sensitive to same. Per protocol MD. °

## 2011-11-17 ENCOUNTER — Encounter (HOSPITAL_COMMUNITY): Payer: Self-pay | Admitting: *Deleted

## 2011-12-03 NOTE — Patient Instructions (Signed)
20 Joyce Gilmore  12/03/2011   Your procedure is scheduled on: 12/05/11 0730am-1015am  Report to [facility] at 0530 AM.  Call this number if you have problems the morning of surgery: [phone]   Remember:   Do not eat food:After Midnight.  May have clear liquids:until Midnight .  Marland Kitchen  Take these medicines the morning of surgery with A SIP OF WATER:    Do not wear jewelry, make-up or nail polish.  Do not wear lotions, powders, or perfumes.   Do not shave 48 hours prior to surgery. .  Do not bring valuables to the hospital.  Contacts, dentures or bridgework may not be worn into surgery.  Leave suitcase in the car. After surgery it may be brought to your room.  For patients admitted to the hospital, checkout time is 11:00 AM the day of discharge.               SEE CHG INSTRUCTION SHEET    Please read over the following fact sheets that you were given: MRSA Information, coughing and deep breathing exercises, leg exercises, Blood Transfusion Fact Sheet

## 2011-12-04 ENCOUNTER — Encounter (HOSPITAL_COMMUNITY): Payer: Self-pay | Admitting: Pharmacy Technician

## 2011-12-04 ENCOUNTER — Encounter (HOSPITAL_COMMUNITY)
Admission: RE | Admit: 2011-12-04 | Discharge: 2011-12-04 | Disposition: A | Discharge status: Home / Self Care | Payer: Medicaid Other | Source: Ambulatory Visit | Attending: Orthopaedic Surgery | Admitting: Orthopaedic Surgery

## 2011-12-04 ENCOUNTER — Encounter (HOSPITAL_COMMUNITY): Payer: Self-pay

## 2011-12-04 ENCOUNTER — Ambulatory Visit (HOSPITAL_COMMUNITY)
Admission: RE | Admit: 2011-12-04 | Discharge: 2011-12-04 | Disposition: A | Discharge status: Home / Self Care | Payer: Medicaid Other | Source: Ambulatory Visit | Attending: Orthopaedic Surgery | Admitting: Orthopaedic Surgery

## 2011-12-04 DIAGNOSIS — Z01818 Encounter for other preprocedural examination: Secondary | ICD-10-CM | POA: Insufficient documentation

## 2011-12-04 DIAGNOSIS — I700 Atherosclerosis of aorta: Secondary | ICD-10-CM | POA: Insufficient documentation

## 2011-12-04 DIAGNOSIS — I10 Essential (primary) hypertension: Secondary | ICD-10-CM | POA: Insufficient documentation

## 2011-12-04 DIAGNOSIS — Z01812 Encounter for preprocedural laboratory examination: Secondary | ICD-10-CM | POA: Insufficient documentation

## 2011-12-04 HISTORY — DX: Unspecified osteoarthritis, unspecified site: M19.90

## 2011-12-04 HISTORY — DX: Depression, unspecified: F32.A

## 2011-12-04 HISTORY — DX: Major depressive disorder, single episode, unspecified: F32.9

## 2011-12-04 HISTORY — DX: Chronic kidney disease, unspecified: N18.9

## 2011-12-04 HISTORY — DX: Type 2 diabetes mellitus without complications: E11.9

## 2011-12-04 LAB — URINALYSIS, ROUTINE W REFLEX MICROSCOPIC
Bilirubin Urine: NEGATIVE
Ketones, ur: NEGATIVE mg/dL
Nitrite: NEGATIVE
Protein, ur: NEGATIVE mg/dL
Urobilinogen, UA: 0.2 mg/dL (ref 0.0–1.0)

## 2011-12-04 LAB — CBC
MCH: 26.1 pg (ref 26.0–34.0)
MCV: 78.3 fL (ref 78.0–100.0)
Platelets: 230 10*3/uL (ref 150–400)
RDW: 14.2 % (ref 11.5–15.5)

## 2011-12-04 LAB — BASIC METABOLIC PANEL
CO2: 24 mEq/L (ref 19–32)
Calcium: 9.5 mg/dL (ref 8.4–10.5)
Creatinine, Ser: 0.75 mg/dL (ref 0.50–1.10)
Glucose, Bld: 109 mg/dL — ABNORMAL HIGH (ref 70–99)

## 2011-12-04 LAB — URINE MICROSCOPIC-ADD ON

## 2011-12-04 MED ORDER — CEFAZOLIN SODIUM-DEXTROSE 2-3 GM-% IV SOLR
2.0000 g | INTRAVENOUS | Status: AC
  Administered 2011-12-05: 2 g via INTRAVENOUS

## 2011-12-04 NOTE — Progress Notes (Signed)
BMET routed to Dr Doneen Poisson via fax in [facility].

## 2011-12-04 NOTE — Progress Notes (Signed)
Faxed via EPIC the BMET results and also called and reported to [facility], surgery scheduler the Sodium and Potassium( marked hemolysis) results due to patient first case surgery in the am.

## 2011-12-04 NOTE — Progress Notes (Signed)
Urinalysis results faxed via EPIC to Dr Doneen Poisson

## 2011-12-05 ENCOUNTER — Ambulatory Visit (HOSPITAL_COMMUNITY): Payer: Medicaid Other | Admitting: Anesthesiology

## 2011-12-05 ENCOUNTER — Encounter (HOSPITAL_COMMUNITY): Payer: Self-pay | Admitting: *Deleted

## 2011-12-05 ENCOUNTER — Encounter: Payer: Self-pay | Admitting: Internal Medicine

## 2011-12-05 ENCOUNTER — Inpatient Hospital Stay (HOSPITAL_COMMUNITY)
Admission: RE | Admit: 2011-12-05 | Discharge: 2011-12-08 | DRG: 467 | Disposition: A | Discharge status: Home Health | Payer: Medicaid Other | Source: Ambulatory Visit | Attending: Orthopaedic Surgery | Admitting: Orthopaedic Surgery

## 2011-12-05 ENCOUNTER — Encounter (HOSPITAL_COMMUNITY)
Admission: RE | Disposition: A | Discharge status: Home Health | Payer: Self-pay | Source: Ambulatory Visit | Attending: Orthopaedic Surgery

## 2011-12-05 ENCOUNTER — Ambulatory Visit (HOSPITAL_COMMUNITY): Payer: Medicaid Other

## 2011-12-05 ENCOUNTER — Encounter (HOSPITAL_COMMUNITY): Payer: Self-pay | Admitting: Anesthesiology

## 2011-12-05 DIAGNOSIS — F3289 Other specified depressive episodes: Secondary | ICD-10-CM | POA: Diagnosis present

## 2011-12-05 DIAGNOSIS — Z96659 Presence of unspecified artificial knee joint: Secondary | ICD-10-CM

## 2011-12-05 DIAGNOSIS — I10 Essential (primary) hypertension: Secondary | ICD-10-CM | POA: Diagnosis present

## 2011-12-05 DIAGNOSIS — T84038A Mechanical loosening of other internal prosthetic joint, initial encounter: Secondary | ICD-10-CM

## 2011-12-05 DIAGNOSIS — F329 Major depressive disorder, single episode, unspecified: Secondary | ICD-10-CM | POA: Diagnosis present

## 2011-12-05 DIAGNOSIS — T84039A Mechanical loosening of unspecified internal prosthetic joint, initial encounter: Principal | ICD-10-CM | POA: Diagnosis present

## 2011-12-05 DIAGNOSIS — E119 Type 2 diabetes mellitus without complications: Secondary | ICD-10-CM | POA: Diagnosis present

## 2011-12-05 DIAGNOSIS — D62 Acute posthemorrhagic anemia: Secondary | ICD-10-CM | POA: Diagnosis not present

## 2011-12-05 DIAGNOSIS — Y831 Surgical operation with implant of artificial internal device as the cause of abnormal reaction of the patient, or of later complication, without mention of misadventure at the time of the procedure: Secondary | ICD-10-CM | POA: Diagnosis present

## 2011-12-05 DIAGNOSIS — Z96649 Presence of unspecified artificial hip joint: Secondary | ICD-10-CM

## 2011-12-05 HISTORY — PX: TOTAL HIP REVISION: SHX763

## 2011-12-05 LAB — GLUCOSE, CAPILLARY
Glucose-Capillary: 167 mg/dL — ABNORMAL HIGH (ref 70–99)
Glucose-Capillary: 172 mg/dL — ABNORMAL HIGH (ref 70–99)
Glucose-Capillary: 178 mg/dL — ABNORMAL HIGH (ref 70–99)

## 2011-12-05 SURGERY — TOTAL HIP REVISION
Anesthesia: General | Site: Hip | Laterality: Right | Wound class: Clean

## 2011-12-05 MED ORDER — PROPOFOL 10 MG/ML IV BOLUS
INTRAVENOUS | Status: DC | PRN
  Administered 2011-12-05: 100 mg via INTRAVENOUS

## 2011-12-05 MED ORDER — OXYCODONE HCL 5 MG PO TABS
5.0000 mg | ORAL_TABLET | ORAL | Status: DC | PRN
  Administered 2011-12-05: 5 mg via ORAL
  Administered 2011-12-06 – 2011-12-08 (×8): 10 mg via ORAL
  Filled 2011-12-05 (×3): qty 2
  Filled 2011-12-05: qty 1
  Filled 2011-12-05 (×5): qty 2

## 2011-12-05 MED ORDER — ASPIRIN EC 325 MG PO TBEC
325.0000 mg | DELAYED_RELEASE_TABLET | Freq: Two times a day (BID) | ORAL | Status: DC
  Administered 2011-12-06 – 2011-12-08 (×5): 325 mg via ORAL
  Filled 2011-12-05 (×7): qty 1

## 2011-12-05 MED ORDER — METOCLOPRAMIDE HCL 5 MG/ML IJ SOLN: 5.0000 mg | Freq: Three times a day (TID) | INTRAMUSCULAR | Status: DC | PRN

## 2011-12-05 MED ORDER — ACETAMINOPHEN 650 MG RE SUPP: 650.0000 mg | Freq: Four times a day (QID) | RECTAL | Status: DC | PRN

## 2011-12-05 MED ORDER — METFORMIN HCL 500 MG PO TABS
1000.0000 mg | ORAL_TABLET | Freq: Two times a day (BID) | ORAL | Status: DC
  Administered 2011-12-05 – 2011-12-08 (×6): 1000 mg via ORAL
  Filled 2011-12-05 (×8): qty 2

## 2011-12-05 MED ORDER — HYDROMORPHONE HCL PF 1 MG/ML IJ SOLN
INTRAMUSCULAR | Status: AC
  Filled 2011-12-05: qty 1

## 2011-12-05 MED ORDER — HYDROMORPHONE HCL PF 1 MG/ML IJ SOLN
1.0000 mg | INTRAMUSCULAR | Status: DC | PRN
  Administered 2011-12-05: 0.5 mg via INTRAVENOUS
  Filled 2011-12-05: qty 1

## 2011-12-05 MED ORDER — INSULIN ASPART 100 UNIT/ML ~~LOC~~ SOLN
0.0000 [IU] | Freq: Three times a day (TID) | SUBCUTANEOUS | Status: DC
  Administered 2011-12-05 – 2011-12-06 (×3): 3 [IU] via SUBCUTANEOUS
  Administered 2011-12-06: 15 [IU] via SUBCUTANEOUS
  Administered 2011-12-07 (×2): 5 [IU] via SUBCUTANEOUS
  Administered 2011-12-07 – 2011-12-08 (×2): 3 [IU] via SUBCUTANEOUS

## 2011-12-05 MED ORDER — ACETAMINOPHEN 10 MG/ML IV SOLN
INTRAVENOUS | Status: AC
  Filled 2011-12-05: qty 100

## 2011-12-05 MED ORDER — METHOCARBAMOL 100 MG/ML IJ SOLN
500.0000 mg | Freq: Four times a day (QID) | INTRAVENOUS | Status: DC | PRN
  Administered 2011-12-05: 500 mg via INTRAVENOUS
  Filled 2011-12-05: qty 5

## 2011-12-05 MED ORDER — KETOROLAC TROMETHAMINE 15 MG/ML IJ SOLN
7.5000 mg | Freq: Four times a day (QID) | INTRAMUSCULAR | Status: AC
  Administered 2011-12-05 – 2011-12-06 (×4): 7.5 mg via INTRAVENOUS
  Filled 2011-12-05 (×4): qty 1

## 2011-12-05 MED ORDER — ACETAMINOPHEN 325 MG PO TABS: 650.0000 mg | ORAL_TABLET | Freq: Four times a day (QID) | ORAL | Status: DC | PRN

## 2011-12-05 MED ORDER — LACTATED RINGERS IV SOLN: INTRAVENOUS | Status: DC

## 2011-12-05 MED ORDER — CEFAZOLIN SODIUM 1-5 GM-% IV SOLN
1.0000 g | Freq: Four times a day (QID) | INTRAVENOUS | Status: AC
  Administered 2011-12-05 (×2): 1 g via INTRAVENOUS
  Filled 2011-12-05 (×2): qty 50

## 2011-12-05 MED ORDER — SODIUM CHLORIDE 0.9 % IV SOLN: INTRAVENOUS | Status: DC

## 2011-12-05 MED ORDER — ONDANSETRON HCL 4 MG PO TABS
4.0000 mg | ORAL_TABLET | Freq: Four times a day (QID) | ORAL | Status: DC | PRN
  Administered 2011-12-07: 4 mg via ORAL
  Filled 2011-12-05: qty 1

## 2011-12-05 MED ORDER — MENTHOL 3 MG MT LOZG
1.0000 | LOZENGE | OROMUCOSAL | Status: DC | PRN
  Filled 2011-12-05: qty 9

## 2011-12-05 MED ORDER — ONDANSETRON HCL 4 MG/2ML IJ SOLN
INTRAMUSCULAR | Status: DC | PRN
  Administered 2011-12-05: 4 mg via INTRAVENOUS

## 2011-12-05 MED ORDER — ROCURONIUM BROMIDE 100 MG/10ML IV SOLN
INTRAVENOUS | Status: DC | PRN
  Administered 2011-12-05: 40 mg via INTRAVENOUS

## 2011-12-05 MED ORDER — GLYCOPYRROLATE 0.2 MG/ML IJ SOLN
INTRAMUSCULAR | Status: DC | PRN
  Administered 2011-12-05: .5 mg via INTRAVENOUS

## 2011-12-05 MED ORDER — LISINOPRIL 40 MG PO TABS
40.0000 mg | ORAL_TABLET | Freq: Every morning | ORAL | Status: DC
  Administered 2011-12-06 – 2011-12-08 (×3): 40 mg via ORAL
  Filled 2011-12-05 (×4): qty 1

## 2011-12-05 MED ORDER — EPHEDRINE SULFATE 50 MG/ML IJ SOLN
INTRAMUSCULAR | Status: DC | PRN
  Administered 2011-12-05 (×3): 5 mg via INTRAVENOUS

## 2011-12-05 MED ORDER — MUPIROCIN 2 % EX OINT
TOPICAL_OINTMENT | Freq: Two times a day (BID) | CUTANEOUS | Status: DC
  Administered 2011-12-05: 1 via NASAL
  Filled 2011-12-05: qty 22

## 2011-12-05 MED ORDER — METOCLOPRAMIDE HCL 10 MG PO TABS: 5.0000 mg | ORAL_TABLET | Freq: Three times a day (TID) | ORAL | Status: DC | PRN

## 2011-12-05 MED ORDER — PHENOL 1.4 % MT LIQD
1.0000 | OROMUCOSAL | Status: DC | PRN
  Filled 2011-12-05: qty 177

## 2011-12-05 MED ORDER — PROMETHAZINE HCL 25 MG/ML IJ SOLN: 6.2500 mg | INTRAMUSCULAR | Status: DC | PRN

## 2011-12-05 MED ORDER — 0.9 % SODIUM CHLORIDE (POUR BTL) OPTIME
TOPICAL | Status: DC | PRN
  Administered 2011-12-05: 1000 mL

## 2011-12-05 MED ORDER — MEPERIDINE HCL 50 MG/ML IJ SOLN: 6.2500 mg | INTRAMUSCULAR | Status: DC | PRN

## 2011-12-05 MED ORDER — ZOLPIDEM TARTRATE 5 MG PO TABS: 5.0000 mg | ORAL_TABLET | Freq: Every evening | ORAL | Status: DC | PRN

## 2011-12-05 MED ORDER — DIPHENHYDRAMINE HCL 12.5 MG/5ML PO ELIX: 12.5000 mg | ORAL_SOLUTION | ORAL | Status: DC | PRN

## 2011-12-05 MED ORDER — HETASTARCH-ELECTROLYTES 6 % IV SOLN
INTRAVENOUS | Status: DC | PRN
  Administered 2011-12-05: 08:00:00 via INTRAVENOUS

## 2011-12-05 MED ORDER — FERROUS SULFATE 325 (65 FE) MG PO TABS
325.0000 mg | ORAL_TABLET | Freq: Three times a day (TID) | ORAL | Status: DC
  Administered 2011-12-05 – 2011-12-08 (×8): 325 mg via ORAL
  Filled 2011-12-05 (×12): qty 1

## 2011-12-05 MED ORDER — HYDROMORPHONE HCL PF 1 MG/ML IJ SOLN
0.2500 mg | INTRAMUSCULAR | Status: DC | PRN
  Administered 2011-12-05 (×2): 0.5 mg via INTRAVENOUS

## 2011-12-05 MED ORDER — SIMVASTATIN 20 MG PO TABS
20.0000 mg | ORAL_TABLET | Freq: Every day | ORAL | Status: DC
  Administered 2011-12-05 – 2011-12-07 (×3): 20 mg via ORAL
  Filled 2011-12-05 (×4): qty 1

## 2011-12-05 MED ORDER — ACETAMINOPHEN 10 MG/ML IV SOLN
INTRAVENOUS | Status: DC | PRN
  Administered 2011-12-05: 1000 mg via INTRAVENOUS

## 2011-12-05 MED ORDER — CEFAZOLIN SODIUM-DEXTROSE 2-3 GM-% IV SOLR
INTRAVENOUS | Status: AC
  Filled 2011-12-05: qty 50

## 2011-12-05 MED ORDER — METFORMIN HCL 500 MG PO TABS: 1000.0000 mg | ORAL_TABLET | Freq: Two times a day (BID) | ORAL | Status: DC

## 2011-12-05 MED ORDER — DOCUSATE SODIUM 100 MG PO CAPS
100.0000 mg | ORAL_CAPSULE | Freq: Two times a day (BID) | ORAL | Status: DC
  Administered 2011-12-05 – 2011-12-08 (×6): 100 mg via ORAL

## 2011-12-05 MED ORDER — SODIUM CHLORIDE 0.9 % IV SOLN
INTRAVENOUS | Status: DC
  Administered 2011-12-05 – 2011-12-06 (×2): via INTRAVENOUS

## 2011-12-05 MED ORDER — FENTANYL CITRATE 0.05 MG/ML IJ SOLN
INTRAMUSCULAR | Status: DC | PRN
  Administered 2011-12-05: 25 ug via INTRAVENOUS
  Administered 2011-12-05 (×4): 50 ug via INTRAVENOUS
  Administered 2011-12-05: 100 ug via INTRAVENOUS
  Administered 2011-12-05: 25 ug via INTRAVENOUS

## 2011-12-05 MED ORDER — METHOCARBAMOL 500 MG PO TABS
500.0000 mg | ORAL_TABLET | Freq: Four times a day (QID) | ORAL | Status: DC | PRN
  Administered 2011-12-05: 500 mg via ORAL
  Filled 2011-12-05: qty 1

## 2011-12-05 MED ORDER — ONDANSETRON HCL 4 MG/2ML IJ SOLN: 4.0000 mg | Freq: Four times a day (QID) | INTRAMUSCULAR | Status: DC | PRN

## 2011-12-05 MED ORDER — NEOSTIGMINE METHYLSULFATE 1 MG/ML IJ SOLN
INTRAMUSCULAR | Status: DC | PRN
  Administered 2011-12-05: 4 mg via INTRAVENOUS

## 2011-12-05 MED ORDER — ALUM & MAG HYDROXIDE-SIMETH 200-200-20 MG/5ML PO SUSP: 30.0000 mL | ORAL | Status: DC | PRN

## 2011-12-05 MED ORDER — GLIPIZIDE ER 5 MG PO TB24
5.0000 mg | ORAL_TABLET | Freq: Every day | ORAL | Status: DC
  Administered 2011-12-06 – 2011-12-08 (×3): 5 mg via ORAL
  Filled 2011-12-05 (×5): qty 1

## 2011-12-05 MED ORDER — SODIUM CHLORIDE 0.9 % IV SOLN
INTRAVENOUS | Status: DC | PRN
  Administered 2011-12-05 (×3): via INTRAVENOUS

## 2011-12-05 SURGICAL SUPPLY — 44 items
BAG ZIPLOCK 12X15 (MISCELLANEOUS) ×4 IMPLANT
BALL HIP ARTICU EZE 36 8.5 (Hips) ×1 IMPLANT
BLADE EXTENDED COATED 6.5IN (ELECTRODE) ×2 IMPLANT
BLADE HEX COATED 2.75 (ELECTRODE) ×2 IMPLANT
BLADE SAW SAG 73X25 THK (BLADE) ×1
BLADE SAW SGTL 73X25 THK (BLADE) ×1 IMPLANT
BRUSH FEMORAL CANAL (MISCELLANEOUS) ×2 IMPLANT
CLOTH BEACON ORANGE TIMEOUT ST (SAFETY) ×2 IMPLANT
DRAPE HIP W/POCKET STRL (DRAPE) ×2 IMPLANT
DRAPE INCISE IOBAN 85X60 (DRAPES) ×2 IMPLANT
DRAPE POUCH INSTRU U-SHP 10X18 (DRAPES) ×2 IMPLANT
DRAPE SURG 17X11 SM STRL (DRAPES) ×2 IMPLANT
DRAPE U-SHAPE 47X51 STRL (DRAPES) ×2 IMPLANT
DRSG MEPILEX BORDER 4X8 (GAUZE/BANDAGES/DRESSINGS) ×2 IMPLANT
DRSG PAD ABDOMINAL 8X10 ST (GAUZE/BANDAGES/DRESSINGS) IMPLANT
DRSG XEROFORM 1X8 (GAUZE/BANDAGES/DRESSINGS) ×2 IMPLANT
ELECT REM PT RETURN 9FT ADLT (ELECTROSURGICAL) ×2
ELECTRODE REM PT RTRN 9FT ADLT (ELECTROSURGICAL) ×1 IMPLANT
ELIMINATOR HOLE APEX DEPUY (Hips) ×2 IMPLANT
EVACUATOR 1/8 PVC DRAIN (DRAIN) IMPLANT
FACESHIELD LNG OPTICON STERILE (SAFETY) ×8 IMPLANT
GAUZE XEROFORM 1X8 LF (GAUZE/BANDAGES/DRESSINGS) ×2 IMPLANT
GLOVE BIO SURGEON STRL SZ7.5 (GLOVE) IMPLANT
GOWN STRL REIN XL XLG (GOWN DISPOSABLE) ×4 IMPLANT
HANDPIECE INTERPULSE COAX TIP (DISPOSABLE) ×1
HIP BALL ARTICU EZE 36 8.5 (Hips) ×2 IMPLANT
LINER NEUTRAL 52X36MM PLUS 4 (Liner) ×2 IMPLANT
MANIFOLD NEPTUNE II (INSTRUMENTS) ×2 IMPLANT
PACK TOTAL JOINT (CUSTOM PROCEDURE TRAY) ×2 IMPLANT
PIN SECTOR W/GRIP ACE CUP 52MM (Hips) ×2 IMPLANT
POSITIONER SURGICAL ARM (MISCELLANEOUS) ×2 IMPLANT
SCREW 6.5MMX25MM (Screw) ×4 IMPLANT
SET HNDPC FAN SPRY TIP SCT (DISPOSABLE) ×1 IMPLANT
SPONGE GAUZE 4X4 12PLY (GAUZE/BANDAGES/DRESSINGS) IMPLANT
SPONGE LAP 18X18 X RAY DECT (DISPOSABLE) ×4 IMPLANT
STAPLER VISISTAT 35W (STAPLE) ×2 IMPLANT
STEM FEM CMNTLSS SM AML 15.0 (Hips) ×2 IMPLANT
SUT ETHIBOND NAB CT1 #1 30IN (SUTURE) ×4 IMPLANT
SUT VIC AB 1 CT1 36 (SUTURE) ×4 IMPLANT
SUT VIC AB 2-0 CT1 27 (SUTURE) ×2
SUT VIC AB 2-0 CT1 TAPERPNT 27 (SUTURE) ×2 IMPLANT
SUT VLOC 180 0 24IN GS25 (SUTURE) ×2 IMPLANT
TOWEL OR 17X26 10 PK STRL BLUE (TOWEL DISPOSABLE) ×6 IMPLANT
TRAY FOLEY CATH 14FRSI W/METER (CATHETERS) ×2 IMPLANT

## 2011-12-05 NOTE — Brief Op Note (Signed)
12/05/2011  9:37 AM  PATIENT:  Joyce Gilmore  77 y.o. female  PRE-OPERATIVE DIAGNOSIS:  Loose/failed right hip hemiarthroplasty  POST-OPERATIVE DIAGNOSIS:  Loose/failed right hip hemiarthroplasty  PROCEDURE:  Procedure(s) (LRB) with comments: TOTAL HIP REVISION (Right) - Right Hip Revision Arthroplasty to Total Hip, Excision of Old Implant  SURGEON:  Surgeon(s) and Role:    * Kathryne Hitch, MD - Primary  PHYSICIAN ASSISTANT:   ASSISTANTS: none   ANESTHESIA:   general  EBL:  Total I/O In: 3100 [I.V.:2600; IV Piggyback:500] Out: 800 [Urine:100; Blood:700]  BLOOD ADMINISTERED:none  DRAINS: none   LOCAL MEDICATIONS USED:  NONE  SPECIMEN:  No Specimen  DISPOSITION OF SPECIMEN:  N/A  COUNTS:  YES  TOURNIQUET:  * No tourniquets in log *  DICTATION: .Other Dictation: Dictation Number [id number]  PLAN OF CARE: Admit to inpatient   PATIENT DISPOSITION:  PACU - hemodynamically stable.   Delay start of Pharmacological VTE agent (>24hrs) due to surgical blood loss or risk of bleeding: no

## 2011-12-05 NOTE — Progress Notes (Signed)
"  Joyce Gilmore" Interpreter here with pt and family to assist with communication.  Interpreter will go to holding with pt.

## 2011-12-05 NOTE — Preoperative (Signed)
Beta Blockers   Reason not to administer Beta Blockers:Not Applicable 

## 2011-12-05 NOTE — Op Note (Signed)
[name], KRAGE NO.:  [id number]  MEDICAL RECORD NO.:  [id number]  LOCATION:  1604                         FACILITY:  [facility]  PHYSICIAN:  Vanita Panda. 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  PROCEDURE: 1. Removal of left hip hemiarthroplasty and cement. 2. Revision arthroplasty to right total hip arthroplasty with     placement of acetabular and femoral components.  IMPLANTS:  [employer] acetabular component size 52 with 2 screws and apex hole eliminator, size 36+ 4 neutral polyethylene liner, size 15 small AML femoral component, size 36+ 8.5 metal hip ball.  SURGEON:  Vanita Panda. Magnus Ivan, MD  ANESTHESIA:  General.  ANTIBIOTICS:  2 g IV Ancef.  BLOOD LOSS:  600-700 mL.  COMPLICATIONS:  None.  INDICATIONS:  Joyce Gilmore is a 77 year old female well known to me.  She is non-English speaking from [place].  About 3 years ago, she fell while she was in [place], sustaining a right hip femoral neck fracture and pubic rami fracture on the right side.  She underwent a hemiarthroplasty which was cemented.  Over the last year or more, she has had a lot of mechanical symptoms with pain in the groin and pain with rotation of the hip.  X-rays showed that the femoral component was completely loose.  I recommended she undergo revision arthroplasty, did this through her family and interpreters and she does wish to proceed with this due to her daily pain, decreased quality of life, and decreased activities of daily living.  She understands the risks, the fracture, acute blood loss anemia, nerve and vessel injury, infection, fracture and DVT.  PROCEDURE DESCRIPTION:  After informed consent  was obtained, appropriate right hip was marked.  She was brought to the operating room, placed supine on the operating table.  General anesthesia was then obtained.  A Foley catheter was placed and she was turned into the lateral decubitus position with the right operative hip up and padding of the down on operative left leg.  Hip positioner was placed in the front and the back and right hip was prepped and draped with DuraPrep and sterile drapes. A time-out was called and she was identified as correct patient, correct right hip.  I then did not use her old posterior incision.  I made a new anterolateral incision directly over the greater trochanter and carried this proximally and distally.  I dissected down to the iliotibial band. The iliotibial band was divided longitudinally.  I then proceeded with an anterolateral approach to the hip with taking the sleeves of the gluteus medius and minimus off of the greater trochanter and reflected these anteriorly.  I dissected down to the hip capsule and divided the hip capsule easily and [place] component.  I was able to easily dislocate this and remove it easily.  It was entirely loose.  I then removed all the bone cement without complications.  With the hip flexed and externally rotated off the table, I then began preparing the femoral  canal.  I began reaming all the way up to a size 15 reamer and then broached to a size 15 small stature broach.  I felt this was nice and stable.  I then removed the broach and attention was turned to the acetabulum.  I cleaned the acetabular debris and then began reaming from size 42 reamer in 2 mm increments up to a size 52.  I then placed the real size 52 acetabular component which is a Museum/gallery curator and I placed a 2 screws in this as well.  It was nice and secure.  I then placed the real 36+ 4 neutral polyethylene liner.  I went back to the femur and then placed the real size 15 small  stature AML femoral component from [employer] which is a fully porous coated component.  I then started trialing hip balls and the +8.5 hip ball gave [place] the most stability.  I then placed the real metal 8.5 hip ball and reduced this in the acetabulum.  Her leg lengths were felt to be near equal.  She was stable with internal and external rotation.  I then copiously irrigated the soft tissue with normal saline solution using pulsatile lavage.  I closed the joint capsule with interrupted #1 Ethibond suture.  I reapproximated the gluteus medius and minimus to the greater trochanter using #1 Ethibond suture.  I used a 0 V-Loc suture in the iliotibial band, 2-0 Vicryl in the deep and subcutaneous tissue, and interrupted staples on the skin.  A well-padded sterile dressings applied.  She was rolled to supine position.  Her leg lengths were equal.  She was awakened, extubated, and taken to recovery room in stable condition.  All final counts were correct.  There were no complications noted.     Vanita Panda. Magnus Ivan, M.D.     CYB/MEDQ  D:  12/05/2011  T:  12/05/2011  Job:  [id number]

## 2011-12-05 NOTE — Anesthesia Postprocedure Evaluation (Signed)
  Anesthesia Post-op Note  Patient: Joyce Gilmore  Procedure(s) Performed: Procedure(s) (LRB): TOTAL HIP REVISION (Right)  Patient Location: PACU  Anesthesia Type: General  Level of Consciousness: awake and alert   Airway and Oxygen Therapy: Patient Spontanous Breathing  Post-op Pain: mild  Post-op Assessment: Post-op Vital signs reviewed, Patient's Cardiovascular Status Stable, Respiratory Function Stable, Patent Airway and No signs of Nausea or vomiting  Post-op Vital Signs: stable  Complications: No apparent anesthesia complications

## 2011-12-05 NOTE — Progress Notes (Signed)
Utilization review completed.  

## 2011-12-05 NOTE — Progress Notes (Signed)
Pt has requested to keep madalion in her hand thru holding and interpreter will take it back to family.

## 2011-12-05 NOTE — Anesthesia Preprocedure Evaluation (Addendum)
Anesthesia Evaluation  Patient identified by MRN, date of birth, ID band Patient awake    Reviewed: Allergy & Precautions, H&P , NPO status , Patient's Chart, lab work & pertinent test results  Airway Mallampati: II TM Distance: >3 FB Neck ROM: Full    Dental No notable dental hx. (+) Partial Upper   Pulmonary neg pulmonary ROS,  breath sounds clear to auscultation  Pulmonary exam normal       Cardiovascular hypertension, Pt. on medications negative cardio ROS  Rhythm:Regular Rate:Normal     Neuro/Psych PSYCHIATRIC DISORDERS Depression negative neurological ROS  negative psych ROS   GI/Hepatic negative GI ROS, Neg liver ROS,   Endo/Other  negative endocrine ROSdiabetes, Type 2, Oral Hypoglycemic Agents  Renal/GU negative Renal ROS  negative genitourinary   Musculoskeletal negative musculoskeletal ROS (+)   Abdominal   Peds negative pediatric ROS (+)  Hematology negative hematology ROS (+)   Anesthesia Other Findings   Reproductive/Obstetrics negative OB ROS                          Anesthesia Physical Anesthesia Plan  ASA: II  Anesthesia Plan: General   Post-op Pain Management:    Induction: Intravenous  Airway Management Planned: Oral ETT  Additional Equipment:   Intra-op Plan:   Post-operative Plan: Extubation in OR  Informed Consent: I have reviewed the patients History and Physical, chart, labs and discussed the procedure including the risks, benefits and alternatives for the proposed anesthesia with the patient or authorized representative who has indicated his/her understanding and acceptance.   Dental advisory given  Plan Discussed with: CRNA  Anesthesia Plan Comments:         Anesthesia Quick Evaluation

## 2011-12-05 NOTE — Progress Notes (Signed)
PORTABLE AP PELVIS AND LATERAL RIGHT HIP X-RAYS DONE. 

## 2011-12-05 NOTE — Progress Notes (Signed)
X-RAY RESULTS NOTED. 

## 2011-12-05 NOTE — Transfer of Care (Signed)
Immediate Anesthesia Transfer of Care Note  Patient: Joyce Gilmore  Procedure(s) Performed: Procedure(s) (LRB) with comments: TOTAL HIP REVISION (Right) - Right Hip Revision Arthroplasty to Total Hip, Excision of Old Implant  Patient Location: PACU  Anesthesia Type: General  Level of Consciousness: awake, alert  and patient cooperative  Airway & Oxygen Therapy: Patient Spontanous Breathing and Patient connected to face mask oxygen  Post-op Assessment: Report given to PACU RN and Post -op Vital signs reviewed and stable  Post vital signs: Reviewed and stable  Complications: No apparent anesthesia complications

## 2011-12-05 NOTE — H&P (Signed)
Joyce Gilmore is an 77 y.o. female.   Chief Complaint:   Severe right hip pain HPI:   77 yo female who sustained a mechanical fall 3 years ago in [place].  She had a femoral neck fracture on the right and right rami fractures as well.  A cemented right hip hemiarthroplasty was performed in [place].  She has been developing worsening right hip pain and difficulty ambulating.  X-rays are worrisome for loosening.  She and hr family wish to proceed with a revision to a total hip.  The are fully aware of the difficulty in this given her cemented hemi.  The risks are fracture, blood loss, nerve injury, infection, and DVT.  The goals are improved mobility and decreased pain.  Past Medical History  Diagnosis Date  . Hypertension   . Depression     hx of   . Diabetes mellitus without complication   . Arthritis   . Chronic kidney disease     hx of kidney stones,     Past Surgical History  Procedure Date  . Joint replacement     bilateral knee, right hip     History reviewed. No pertinent family history. Social History:  reports that she has never smoked. She has never used smokeless tobacco. She reports that she does not drink alcohol or use illicit drugs.  Allergies: No Known Allergies  Medications Prior to Admission  Medication Sig Dispense Refill  . aspirin 81 MG tablet Take 81 mg by mouth every morning.       . calcium-vitamin D (OSCAL WITH D) 500-200 MG-UNIT per tablet Take 1 tablet by mouth daily.      . cetirizine (ZYRTEC) 10 MG tablet Take 10 mg by mouth daily as needed. For allergies      . Cholecalciferol (VITAMIN D-400 PO) Take 400 Units by mouth daily.      Marland Kitchen glipiZIDE (GLUCOTROL XL) 5 MG 24 hr tablet Take 5 mg by mouth daily.      Marland Kitchen lisinopril (PRINIVIL,ZESTRIL) 40 MG tablet Take 40 mg by mouth every morning.       . metFORMIN (GLUCOPHAGE) 1000 MG tablet Take 1,000 mg by mouth 2 (two) times daily with a meal.      . pravastatin (PRAVACHOL) 40 MG tablet Take 40 mg by  mouth at bedtime.       . sitaGLIPtin (JANUVIA) 100 MG tablet Take 100 mg by mouth every morning.       . traMADol (ULTRAM) 50 MG tablet Take 100 mg by mouth every 8 (eight) hours as needed. For pain        Results for orders placed during the hospital encounter of 12/05/11 (from the past 48 hour(s))  GLUCOSE, CAPILLARY     Status: Abnormal   Collection Time   12/05/11  5:52 AM      Component Value Range Comment   Glucose-Capillary 167 (*) 70 - 99 mg/dL    Dg Chest 2 View  16/11/9602  *RADIOLOGY REPORT*  Clinical Data: History of hypertension.  Preoperative evaluation prior to hip surgery.  CHEST - 2 VIEW  Comparison: Chest x-ray 05/16/2011.  Findings: Lung volumes are normal.  No consolidative airspace disease.  No pleural effusions.  No pneumothorax.  No pulmonary nodule or mass noted.  Pulmonary vasculature and the cardiomediastinal silhouette are within normal limits. Atherosclerotic calcifications are noted within the arch of the aorta.  IMPRESSION: 1. No radiographic evidence of acute cardiopulmonary disease. 2.  Atherosclerosis.   Original  Report Authenticated By: Florencia Reasons, M.D.     Review of Systems  Musculoskeletal: Positive for joint pain.  All other systems reviewed and are negative.    Blood pressure 112/89, pulse 78, temperature 98.1 F (36.7 C), resp. rate 20, SpO2 98.00%. Physical Exam  Constitutional: She is oriented to person, place, and time. She appears well-developed and well-nourished.  HENT:  Head: Normocephalic and atraumatic.  Eyes: EOM are normal. Pupils are equal, round, and reactive to light.  Neck: Normal range of motion. Neck supple.  Cardiovascular: Normal rate and regular rhythm.   Respiratory: Effort normal and breath sounds normal.  GI: Soft. Bowel sounds are normal.  Musculoskeletal:       Right hip: She exhibits decreased range of motion, decreased strength and bony tenderness.  Neurological: She is alert and oriented to person, place,  and time.  Skin: Skin is warm and dry.  Psychiatric: She has a normal mood and affect.     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 1) to the OR for removal of right hip hemiarthroplasty and conversion to a right total hip; then admission as an inpatient.  Awa Bachicha Y 12/05/2011, 7:21 AM

## 2011-12-06 LAB — GLUCOSE, CAPILLARY
Glucose-Capillary: 183 mg/dL — ABNORMAL HIGH (ref 70–99)
Glucose-Capillary: 216 mg/dL — ABNORMAL HIGH (ref 70–99)

## 2011-12-06 LAB — BASIC METABOLIC PANEL
Calcium: 7.3 mg/dL — ABNORMAL LOW (ref 8.4–10.5)
GFR calc Af Amer: 67 mL/min — ABNORMAL LOW (ref >90)
Glucose, Bld: 193 mg/dL — ABNORMAL HIGH (ref 70–99)
Potassium: 5.2 mEq/L — ABNORMAL HIGH (ref 3.5–5.1)

## 2011-12-06 LAB — CBC
HCT: 20.1 % — ABNORMAL LOW (ref 36.0–46.0)
Hemoglobin: 6.5 g/dL — CL (ref 12.0–15.0)
MCHC: 32.3 g/dL (ref 30.0–36.0)
MCV: 79.8 fL (ref 78.0–100.0)

## 2011-12-06 NOTE — Evaluation (Signed)
Physical Therapy Evaluation Patient Details Name: Joyce Gilmore MRN: [id number] DOB: 1934/11/28 Today's Date: 12/06/2011 Time: 4098-1191 PT Time Calculation (min): 20 min  PT Assessment / Plan / Recommendation Clinical Impression  Pt presents s/p R THA (antero-lateral approach) POD 1 with decreased strength, ROM and mobility.  Per chart, pt with recent hemiarthroplasty in [place] and fell.  Tolerated OOB and ambulation in hallway, however noted pt with difficulty following ER precaution with gait.  Educated daughter and provided handout.  Pt will benefit from skilled PT in acute venue to address deficits.  PT recommends HHPT for follow up at D/C.      PT Assessment  Patient needs continued PT services    Follow Up Recommendations  Home health PT    Does the patient have the potential to tolerate intense rehabilitation      Barriers to Discharge None      Equipment Recommendations  None recommended by PT    Recommendations for Other Services OT consult   Frequency 7X/week    Precautions / Restrictions Precautions Precautions: Anterior Hip Precaution Booklet Issued: Yes (comment) Precaution Comments: Provided daughter with educated and handout.   Will follow up on getting spanish anterior precautions sheet.  Restrictions Weight Bearing Restrictions: No Other Position/Activity Restrictions: WBAT   Pertinent Vitals/Pain 510 pain, ice pack applied      Mobility  Bed Mobility Bed Mobility: Supine to Sit Supine to Sit: 1: +2 Total assist Supine to Sit: Patient Percentage: 50% Details for Bed Mobility Assistance: Assist for RLE out of bed and some assist for trunk to attain sitting position.  Cues for hand placement with daughter translating.  Transfers Transfers: Sit to Stand;Stand to Sit Sit to Stand: 1: +2 Total assist;With upper extremity assist;From bed Sit to Stand: Patient Percentage: 70% Stand to Sit: 1: +2 Total assist;With upper extremity assist;With  armrests;To chair/3-in-1 Stand to Sit: Patient Percentage: 70% Details for Transfer Assistance: Assist to rise and steady with cues for hand placement and LE management when sitting/standing.   Ambulation/Gait Ambulation/Gait Assistance: 1: +2 Total assist Ambulation/Gait: Patient Percentage: 70% Ambulation Distance (Feet): 50 Feet Assistive device: Rolling walker Ambulation/Gait Assistance Details: Max cues for sequencing/technique with RW, upright posture and to maintain hip precautions, however very difficult with language barrier.  Daughter assisted with translation.  Gait Pattern: Step-to pattern;Decreased stance time - right;Decreased step length - left;Decreased stride length;Trunk flexed;Narrow base of support Gait velocity: decreased General Gait Details: Pt tends to "toe out" with gait.  Stairs: No Wheelchair Mobility Wheelchair Mobility: No    Shoulder Instructions     Exercises     PT Diagnosis: Difficulty walking;Generalized weakness;Acute pain  PT Problem List: Decreased strength;Decreased range of motion;Decreased activity tolerance;Decreased balance;Decreased mobility;Decreased knowledge of use of DME;Decreased safety awareness;Pain PT Treatment Interventions: DME instruction;Gait training;Stair training;Functional mobility training;Therapeutic activities;Therapeutic exercise;Balance training;Patient/family education   PT Goals Acute Rehab PT Goals PT Goal Formulation: With patient/family Time For Goal Achievement: 12/13/11 Potential to Achieve Goals: Good Pt will go Supine/Side to Sit: with supervision PT Goal: Supine/Side to Sit - Progress: Goal set today Pt will go Sit to Supine/Side: with supervision PT Goal: Sit to Supine/Side - Progress: Goal set today Pt will go Sit to Stand: with supervision PT Goal: Sit to Stand - Progress: Goal set today Pt will go Stand to Sit: with supervision PT Goal: Stand to Sit - Progress: Goal set today Pt will Ambulate: 51 - 150  feet;with supervision;with least restrictive assistive device PT Goal: Ambulate -  Progress: Goal set today Pt will Go Up / Down Stairs: 3-5 stairs;with min assist;with rail(s);with least restrictive assistive device PT Goal: Up/Down Stairs - Progress: Goal set today  Visit Information  Last PT Received On: 12/06/11 Assistance Needed: +1    Subjective Data  Subjective: Pt is spanish speaking, daughter translates Patient Stated Goal: n/a   Prior Functioning  Home Living Lives With: Family Available Help at Discharge: Family;Available 24 hours/day Type of Home: House Home Access: Stairs to enter [employer] of Steps: 3 Entrance Stairs-Rails: Can reach both;Right;Left Home Layout: One level Bathroom Shower/Tub: Engineer, manufacturing systems: Standard Home Adaptive Equipment: Environmental consultant - rolling Prior Function Level of Independence: Independent Able to Take Stairs?: Yes Vocation: Retired Musician: Prefers language other than English    Cognition  Overall Cognitive Status: Difficult to assess Difficult to assess due to: Non-English speaking Orientation Level: Appears intact for tasks assessed Behavior During Session: [facility] for tasks performed    Extremity/Trunk Assessment Right Lower Extremity Assessment RLE ROM/Strength/Tone: Unable to fully assess;Due to pain RLE Sensation: WFL - Light Touch Left Lower Extremity Assessment LLE ROM/Strength/Tone: WFL for tasks assessed LLE Sensation: WFL - Light Touch Trunk Assessment Trunk Assessment: Kyphotic   Balance    End of Session PT - End of Session Equipment Utilized During Treatment: Gait belt Activity Tolerance: Patient limited by fatigue;Patient limited by pain Patient left: in chair;with call bell/phone within reach;with family/visitor present Nurse Communication: Mobility status  GP     Page, Meribeth Mattes 12/06/2011, 3:03 PM

## 2011-12-06 NOTE — Progress Notes (Signed)
Subjective: 1 Day Post-Op Procedure(s) (LRB): TOTAL HIP REVISION (Right) Patient reports pain as mild.    Objective: Vital signs in last 24 hours: Temp:  [97.9 F (36.6 C)-99.4 F (37.4 C)] 99.4 F (37.4 C) (10/12 1550) Pulse Rate:  [79-99] 99  (10/12 1550) Resp:  [15-20] 20  (10/12 1550) BP: (104-172)/(65-95) 171/95 mmHg (10/12 1550) SpO2:  [98 %-100 %] 100 % (10/12 1335)  Intake/Output from previous day: 10/11 0701 - 10/12 0700 In: 5622.5 [P.O.:780; I.V.:4237.5; IV Piggyback:605] Out: 1750 [Urine:1050; Blood:700] Intake/Output this shift: Total I/O In: 1095 [P.O.:480; I.V.:250; Blood:365] Out: 600 [Urine:600]   Basename 12/06/11 0430 12/04/11 1415  HGB 6.5* 12.0    Basename 12/06/11 0430 12/04/11 1415  WBC 7.1 7.5  RBC 2.52* 4.60  HCT 20.1* 36.0  PLT 152 230    Basename 12/06/11 0430 12/04/11 1415  NA 127* 128*  K 5.2* 5.7*  CL 100 95*  CO2 20 24  BUN 11 12  CREATININE 0.94 0.75  GLUCOSE 193* 109*  CALCIUM 7.3* 9.5    Basename 12/04/11 1415  LABPT --  INR 1.01    Neurologically intact  Assessment/Plan: 1 Day Post-Op Procedure(s) (LRB): TOTAL HIP REVISION (Right) Up with therapy ACUTE BLOOD LOSS ANEMIA AS EXPECTED WITH HIP REVISION, OLD CEMENT REMOVAL ETC.  HGB DROP TO 6.5 fro 12.0 Ambulated with therapy even with low Hgb.  Taleeyah Bora C 12/06/2011, 4:51 PM

## 2011-12-06 NOTE — Progress Notes (Signed)
CRITICAL VALUE ALERT  Critical value received: HGB 6.5  Date of notification:  12/06/11  Time of notification: 0600  Critical value read back: Yes  Nurse who received alert: Aquilla Hacker  MD notified (1st page):  Dr. Ophelia Charter  Time of first page:  0603  MD notified (2nd page): Dr. Ophelia Charter  Time of second page: 0620  Time of third page: [phone]  Responding MD:  Dr. Ophelia Charter  Time MD responded:  [phone]

## 2011-12-07 LAB — CBC
HCT: 28.3 % — ABNORMAL LOW (ref 36.0–46.0)
Hemoglobin: 9.8 g/dL — ABNORMAL LOW (ref 12.0–15.0)
WBC: 11.9 10*3/uL — ABNORMAL HIGH (ref 4.0–10.5)

## 2011-12-07 LAB — TYPE AND SCREEN
ABO/RH(D): O NEG
Antibody Screen: NEGATIVE
Unit division: 0

## 2011-12-07 LAB — GLUCOSE, CAPILLARY
Glucose-Capillary: 215 mg/dL — ABNORMAL HIGH (ref 70–99)
Glucose-Capillary: 211 mg/dL — ABNORMAL HIGH (ref 70–99)

## 2011-12-07 NOTE — Progress Notes (Signed)
12/07/2011 1515 NCM attempted to speak to pt, but she does not speak [place]. Joyce Gilmore is scheduled to follow up for [facility] at d/c. Msg left for MD for orders and F2F at d/c. NCM will continue to follow up until d/c. Isidoro Donning RN CCM Case Mgmt phone [phone]

## 2011-12-07 NOTE — Plan of Care (Signed)
Problem: Phase III Progression Outcomes Goal: Anticoagulant follow-up in place Outcome: Not Applicable Date Met:  12/07/11 xarelto

## 2011-12-07 NOTE — Progress Notes (Signed)
Physical Therapy Treatment Patient Details Name: Joyce Gilmore MRN: [id number] DOB: Aug 01, 1934 Today's Date: 12/07/2011 Time: 4098-1191 PT Time Calculation (min): 25 min  PT Assessment / Plan / Recommendation Comments on Treatment Session  Pt progressing well with mobility, however has difficulty following precautions due to language barrier.  continue to educate daughter and have her translate.     Follow Up Recommendations  Home health PT     Does the patient have the potential to tolerate intense rehabilitation     Barriers to Discharge        Equipment Recommendations  None recommended by PT    Recommendations for Other Services    Frequency 7X/week   Plan Discharge plan remains appropriate    Precautions / Restrictions Precautions Precautions: Anterior Hip Precaution Booklet Issued: Yes (comment) Precaution Comments: Continue to educate on hip precautions.  Unable to find spanish handout Restrictions Weight Bearing Restrictions: No Other Position/Activity Restrictions: WBAT   Pertinent Vitals/Pain 2/10 pain    Mobility  Bed Mobility Bed Mobility: Supine to Sit Supine to Sit: 4: Min assist;3: Mod assist Details for Bed Mobility Assistance: Assist for RLE out of bed and some assist for trunk with cues for hand placement to self assist to EOB.  Daughter translating.  Transfers Transfers: Sit to Stand;Stand to Sit Sit to Stand: 4: Min assist;With upper extremity assist;From bed Stand to Sit: 4: Min assist;With upper extremity assist;With armrests;To chair/3-in-1 Details for Transfer Assistance: Some assist to rise and steady with cues for hand placement and safety when sitting/standing.  Ambulation/Gait Ambulation/Gait Assistance: 4: Min assist Ambulation Distance (Feet): 120 Feet Assistive device: Rolling walker Ambulation/Gait Assistance Details: Max cues for sequencing/technique with RW, for upright posture and to maintain hip precautions.  Pts  daughter translating and demonstrated proper sequence, however difficult for pt to follow commands due to language barrier.  Gait Pattern: Step-to pattern;Decreased stance time - right;Decreased step length - left;Decreased stride length;Trunk flexed;Narrow base of support Gait velocity: decreased General Gait Details: Pt tends to "toe out" with gait.     Exercises     PT Diagnosis:    PT Problem List:   PT Treatment Interventions:     PT Goals Acute Rehab PT Goals PT Goal Formulation: With patient/family Time For Goal Achievement: 12/13/11 Potential to Achieve Goals: Good Pt will go Supine/Side to Sit: with supervision PT Goal: Supine/Side to Sit - Progress: Progressing toward goal Pt will go Sit to Stand: with supervision PT Goal: Sit to Stand - Progress: Progressing toward goal Pt will go Stand to Sit: with supervision PT Goal: Stand to Sit - Progress: Progressing toward goal Pt will Ambulate: 51 - 150 feet;with supervision;with least restrictive assistive device PT Goal: Ambulate - Progress: Progressing toward goal  Visit Information  Last PT Received On: 12/07/11 Assistance Needed: +1    Subjective Data  Subjective: Pt is spanish speaking, daughter translates Patient Stated Goal: n/a   Cognition  Overall Cognitive Status: Difficult to assess Difficult to assess due to: Non-English speaking Arousal/Alertness: Awake/alert Orientation Level: Appears intact for tasks assessed Behavior During Session: [facility] for tasks performed    Balance     End of Session PT - End of Session Activity Tolerance: Patient tolerated treatment well;Patient limited by fatigue Patient left: in chair;with call bell/phone within reach;with family/visitor present Nurse Communication: Mobility status   GP     Page, Meribeth Mattes 12/07/2011, 12:09 PM

## 2011-12-07 NOTE — Progress Notes (Signed)
Subjective: 2 Days Post-Op Procedure(s) (LRB): TOTAL HIP REVISION (Right) Patient reports pain as mild.    Objective: Vital signs in last 24 hours: Temp:  [97.9 F (36.6 C)-99.8 F (37.7 C)] 99 F (37.2 C) (10/13 0644) Pulse Rate:  [62-99] 62  (10/13 0644) Resp:  [16-20] 16  (10/13 0644) BP: (103-172)/(64-95) 103/64 mmHg (10/13 0644) SpO2:  [95 %-100 %] 97 % (10/13 0644)  Intake/Output from previous day: 10/12 0701 - 10/13 0700 In: 2615.4 [P.O.:700; I.V.:1243.8; Blood:671.7] Out: 1050 [Urine:1050] Intake/Output this shift: Total I/O In: 240 [P.O.:240] Out: 250 [Urine:250]   Basename 12/07/11 0435 12/06/11 0430 12/04/11 1415  HGB 9.8* 6.5* 12.0    Basename 12/07/11 0435 12/06/11 0430  WBC 11.9* 7.1  RBC 3.50* 2.52*  HCT 28.3* 20.1*  PLT 140* 152    Basename 12/06/11 0430 12/04/11 1415  NA 127* 128*  K 5.2* 5.7*  CL 100 95*  CO2 20 24  BUN 11 12  CREATININE 0.94 0.75  GLUCOSE 193* 109*  CALCIUM 7.3* 9.5    Basename 12/04/11 1415  LABPT --  INR 1.01    Neurologically intact  Assessment/Plan: 2 Days Post-Op Procedure(s) (LRB): TOTAL HIP REVISION (Right) Up with therapy d/c IV  Abhiraj Dozal C 12/07/2011, 8:50 AM

## 2011-12-07 NOTE — Evaluation (Signed)
Occupational Therapy Evaluation Patient Details Name: Joyce Gilmore MRN: [id number] DOB: Aug 15, 1934 Today's Date: 12/07/2011 Time: 6295-2841 OT Time Calculation (min): 38 min  OT Assessment / Plan / Recommendation Clinical Impression  This 76 yo female s/p RTHA revision to a lateral anterior approach presents to acute OT with problems below. Will benefit from continued OT while on acute, but without follow up (due to Medicaid restraints).    OT Assessment  Patient needs continued OT Services    Follow Up Recommendations  No OT follow up    Barriers to Discharge None    Equipment Recommendations  Tub/shower bench       Frequency  Min 2X/week    Precautions / Restrictions Precautions Precautions: Anterior Hip Precaution Booklet Issued: Yes (comment) Precaution Comments: Continue to educate on hip precautions.  Unable to find spanish handout Restrictions Weight Bearing Restrictions: No Other Position/Activity Restrictions: WBAT   Pertinent Vitals/Pain 2/10 right hip    ADL  Eating/Feeding: Simulated;Independent Where Assessed - Eating/Feeding: Edge of bed Grooming: Simulated;Set up Where Assessed - Grooming: Unsupported sitting Upper Body Bathing: Simulated;Set up Where Assessed - Upper Body Bathing: Unsupported sitting Lower Body Bathing: Simulated;Moderate assistance (without AE) Where Assessed - Lower Body Bathing: Supported sit to stand Upper Body Dressing: Simulated;Minimal assistance Where Assessed - Upper Body Dressing: Unsupported sitting Lower Body Dressing: Performed;Moderate assistance (with AE) Where Assessed - Lower Body Dressing: Supported sit to stand Toilet Transfer: Performed;Minimal Dentist Method: Sit to Barista: Raised toilet seat with arms (or 3-in-1 over toilet) Toileting - Clothing Manipulation and Hygiene: Performed;Moderate assistance Where Assessed - Toileting Clothing Manipulation and  Hygiene: Standing Equipment Used: Rolling walker Transfers/Ambulation Related to ADLs: Min A sit to stand and stand to sit as well as with ambulation ADL Comments: Educated pt and daughter on use of AE for LBD (reacher to doff socks and don/doff underwear, and sock aid to don socks)    OT Diagnosis: Acute pain  OT Problem List: Decreased strength;Decreased activity tolerance;Decreased knowledge of precautions;Decreased knowledge of use of DME or AE;Pain;Impaired balance (sitting and/or standing) OT Treatment Interventions: Self-care/ADL training;DME and/or AE instruction;Patient/family education;Balance training   OT Goals Acute Rehab OT Goals OT Goal Formulation: With patient Time For Goal Achievement: 12/14/11 Potential to Achieve Goals: Good ADL Goals Pt Will Perform Grooming: with set-up;with supervision;Unsupported;Standing at sink (2 tasks) ADL Goal: Grooming - Progress: Goal set today Pt Will Perform Tub/Shower Transfer: with min assist;Ambulation;with DME;Transfer tub bench ADL Goal: Tub/Shower Transfer - Progress: Goal set today Miscellaneous OT Goals Miscellaneous OT Goal #1: Pt will be S in and OOB for BADLs OT Goal: Miscellaneous Goal #1 - Progress: Goal set today  Visit Information  Last OT Received On: 12/07/11 Assistance Needed: +1    Subjective Data  Patient Stated Goal: Did not ask   Prior Functioning     Home Living Lives With: Family Available Help at Discharge: Family;Available PRN/intermittently Type of Home: House Home Access: Stairs to enter [employer] of Steps: 3 Entrance Stairs-Rails: Can reach both;Right;Left Home Layout: One level Bathroom Shower/Tub: Forensic scientist: Standard Home Adaptive Equipment: Walker - rolling;Bedside commode/3-in-1 Prior Function Level of Independence: Independent Able to Take Stairs?: Yes Driving: No Vocation: Retired Musician: Prefers language other than  English (spanish) Dominant Hand: Right            Cognition  Overall Cognitive Status: Appears within functional limits for tasks assessed/performed Difficult to assess due to: Non-English speaking Arousal/Alertness: Awake/alert Orientation  Level: Appears intact for tasks assessed Behavior During Session: [facility] for tasks performed    Extremity/Trunk Assessment Right Upper Extremity Assessment RUE ROM/Strength/Tone: Within functional levels Left Upper Extremity Assessment LUE ROM/Strength/Tone: Within functional levels     Mobility Bed Mobility Bed Mobility: Supine to Sit;Sitting - Scoot to Edge of Bed Supine to Sit: 4: Min assist Sitting - Scoot to [place] of Bed: 3: Mod assist Details for Bed Mobility Assistance: A for RLE off of bed and partially with trunk Transfers Transfers: Sit to Stand;Stand to Sit Sit to Stand: 4: Min assist;With upper extremity assist;From bed Stand to Sit: 4: Min assist;With upper extremity assist;With armrests;To chair/3-in-1 Details for Transfer Assistance: VC's for hand placement for transfers              End of Session OT - End of Session Activity Tolerance: Patient tolerated treatment well Patient left: in chair;with call bell/phone within reach;with family/visitor present (daughter)       Evette Georges [phone] 12/07/2011, 1:28 PM

## 2011-12-07 NOTE — Progress Notes (Signed)
Physical Therapy Treatment Patient Details Name: Joyce Gilmore MRN: [id number] DOB: October 31, 1934 Today's Date: 12/07/2011 Time: 4098-1191 PT Time Calculation (min): 16 min  PT Assessment / Plan / Recommendation Comments on Treatment Session  Pt with increased pain this afternoon, therefore deferred second ambulation, however pt did agree to perform bed exercises.      Follow Up Recommendations  Home health PT     Does the patient have the potential to tolerate intense rehabilitation     Barriers to Discharge        Equipment Recommendations  Tub/shower bench    Recommendations for Other Services    Frequency 7X/week   Plan Discharge plan remains appropriate    Precautions / Restrictions Precautions Precautions: Anterior Hip Precaution Booklet Issued: Yes (comment) Precaution Comments: Continue to educate on hip precautions.  Unable to find spanish handout Restrictions Weight Bearing Restrictions: No Other Position/Activity Restrictions: WBAT   Pertinent Vitals/Pain 7/10, premedicated, ice pack applied    Mobility  Bed Mobility Bed Mobility: Not assessed Supine to Sit: 4: Min assist Sitting - Scoot to Edge of Bed: 3: Mod assist Details for Bed Mobility Assistance: A for RLE off of bed and partially with trunk Transfers Transfers: Not assessed Sit to Stand: 4: Min assist;With upper extremity assist;From bed Stand to Sit: 4: Min assist;With upper extremity assist;With armrests;To chair/3-in-1 Details for Transfer Assistance: VC's for hand placement for transfers Ambulation/Gait Ambulation/Gait Assistance: Not tested (comment) Ambulation Distance (Feet): 120 Feet Assistive device: Rolling walker Ambulation/Gait Assistance Details: Max cues for sequencing/technique with RW, for upright posture and to maintain hip precautions.  Pts daughter translating and demonstrated proper sequence, however difficult for pt to follow commands due to language barrier.  Gait  Pattern: Step-to pattern;Decreased stance time - right;Decreased step length - left;Decreased stride length;Trunk flexed;Narrow base of support Gait velocity: decreased General Gait Details: Pt tends to "toe out" with gait.     Exercises Total Joint Exercises Ankle Circles/Pumps: AROM;Both;20 reps Quad Sets: AROM;Both;10 reps Short Arc Quad: AROM;Right;10 reps Heel Slides: AAROM;Right;10 reps   PT Diagnosis:    PT Problem List:   PT Treatment Interventions:     PT Goals Acute Rehab PT Goals PT Goal Formulation: With patient/family Time For Goal Achievement: 12/13/11 Potential to Achieve Goals: Good  Performed bed there ex in order to progress with mobility goals.    Visit Information  Last PT Received On: 12/07/11 Assistance Needed: +1    Subjective Data  Subjective: Pt is spanish speaking, daughter translates Patient Stated Goal: n/a   Cognition  Overall Cognitive Status: Appears within functional limits for tasks assessed/performed Difficult to assess due to: Non-English speaking Arousal/Alertness: Awake/alert Orientation Level: Appears intact for tasks assessed Behavior During Session: [facility] for tasks performed    Balance     End of Session PT - End of Session Activity Tolerance: Patient limited by pain Patient left: in bed;with call bell/phone within reach;with family/visitor present Nurse Communication: Mobility status   GP     Page, Joyce Gilmore 12/07/2011, 3:42 PM

## 2011-12-08 ENCOUNTER — Encounter (HOSPITAL_COMMUNITY): Payer: Self-pay | Admitting: Orthopaedic Surgery

## 2011-12-08 LAB — CBC
HCT: 27.3 % — ABNORMAL LOW (ref 36.0–46.0)
MCH: 27.9 pg (ref 26.0–34.0)
MCV: 80.3 fL (ref 78.0–100.0)
Platelets: 142 10*3/uL — ABNORMAL LOW (ref 150–400)
RBC: 3.4 MIL/uL — ABNORMAL LOW (ref 3.87–5.11)
RDW: 14.6 % (ref 11.5–15.5)

## 2011-12-08 LAB — GLUCOSE, CAPILLARY: Glucose-Capillary: 171 mg/dL — ABNORMAL HIGH (ref 70–99)

## 2011-12-08 MED ORDER — FERROUS SULFATE 325 (65 FE) MG PO TABS: 325.0000 mg | ORAL_TABLET | Freq: Three times a day (TID) | ORAL | Status: DC

## 2011-12-08 MED ORDER — ASPIRIN 325 MG PO TBEC: 325.0000 mg | DELAYED_RELEASE_TABLET | Freq: Every day | ORAL | Status: DC

## 2011-12-08 MED ORDER — METHOCARBAMOL 500 MG PO TABS: 500.0000 mg | ORAL_TABLET | Freq: Four times a day (QID) | ORAL | Status: DC | PRN

## 2011-12-08 MED ORDER — OXYCODONE-ACETAMINOPHEN 5-325 MG PO TABS: 1.0000 | ORAL_TABLET | ORAL | Status: AC | PRN

## 2011-12-08 NOTE — Care Management Note (Signed)
    Page 1 of 2   12/08/2011     12:56:50 PM   CARE MANAGEMENT NOTE 12/08/2011  Patient:  Joyce Gilmore, Joyce Gilmore   Account Number:  [id number]  Date Initiated:  12/07/2011  Documentation initiated by:  [facility]  Subjective/Objective Assessment:   EA:[id number] right hip hemiarthroplasty; revision rt total hip arthroplasty     Action/Plan:   home with hh services. Adult children will be caregivers   Anticipated DC Date:  12/08/2011   Anticipated DC Plan:  HOME W HOME HEALTH SERVICES      DC Planning Services  CM consult      [facility] Choice  HOME HEALTH   Choice offered to / List presented to:  C-4 Adult Children   DME arranged  NA      DME agency  NA     HH arranged  HH-2 PT  HH-3 OT      [facility] agency  [facility]   Status of service:  Completed, signed off Medicare Important Message given?  NA - LOS <3 / Initial given by admissions (If response is "NO", the following Medicare IM given date fields will be blank) Date Medicare IM given:   Date Additional Medicare IM given:    Discharge Disposition:  HOME W HOME HEALTH SERVICES  Per UR Regulation:    If discussed at Long Length of Stay Meetings, dates discussed:    Comments:  12/08/2011 Raynelle Bring BSN CCM [phone] CM spoke with pt's son who is in patient's room currently. He states he and his sister will be caring for patient upon her discharge. Pt already has RW and BSC. States Genevieve Norlander will provide [facility] services. Services for hhpt/ot will start tomorrow 12/09/2011.   12/07/2011 1515 NCM attempted to speak to pt, but she does not speak [place]. Genevieve Norlander is scheduled to follow up for [facility] at d/c. Msg left for MD for orders and F2F at d/c. NCM will continue to follow up until d/c. Isidoro Donning RN CCM Case Mgmt phone [phone]

## 2011-12-08 NOTE — Progress Notes (Signed)
Subjective: 3 Days Post-Op Procedure(s) (LRB): TOTAL HIP REVISION (Right) Patient reports pain as moderate.  Responded well to transfusion.  Objective: Vital signs in last 24 hours: Temp:  [98.2 F (36.8 C)-99.1 F (37.3 C)] 98.2 F (36.8 C) (10/14 0440) Pulse Rate:  [79-99] 79  (10/14 0440) Resp:  [16-20] 20  (10/14 0440) BP: (108-150)/(49-77) 117/77 mmHg (10/14 0440) SpO2:  [97 %-100 %] 98 % (10/14 0440)  Intake/Output from previous day: 10/13 0701 - 10/14 0700 In: 861.7 [P.O.:480; I.V.:381.7] Out: 1027 [Urine:1027] Intake/Output this shift:     Basename 12/08/11 0425 12/07/11 0435 12/06/11 0430  HGB 9.5* 9.8* 6.5*    Basename 12/08/11 0425 12/07/11 0435  WBC 9.7 11.9*  RBC 3.40* 3.50*  HCT 27.3* 28.3*  PLT 142* 140*    Basename 12/06/11 0430  NA 127*  K 5.2*  CL 100  CO2 20  BUN 11  CREATININE 0.94  GLUCOSE 193*  CALCIUM 7.3*   No results found for this basename: LABPT:2,INR:2 in the last 72 hours  Sensation intact distally Intact pulses distally Dorsiflexion/Plantar flexion intact Incision: scant drainage No cellulitis present Compartment soft  Assessment/Plan: 3 Days Post-Op Procedure(s) (LRB): TOTAL HIP REVISION (Right) Discharge home with home health today.  Joyce Gilmore Y 12/08/2011, 7:36 AM

## 2011-12-08 NOTE — Discharge Summary (Signed)
Patient ID: Joyce Gilmore MRN: [id number] DOB/AGE: Jul 14, 1934 77 y.o.  Admit date: 12/05/2011 Discharge date: 12/08/2011  Admission Diagnoses:  Principal Problem:  *Mechanical loosening of prosthetic hip   Discharge Diagnoses:  Same  Past Medical History  Diagnosis Date  . Hypertension   . Depression     hx of   . Diabetes mellitus without complication   . Arthritis   . Chronic kidney disease     hx of kidney stones,     Surgeries: Procedure(s): TOTAL HIP REVISION on 12/05/2011   Consultants:    Discharged Condition: Improved  Hospital Course: Joyce Gilmore is an 77 y.o. female who was admitted 12/05/2011 for operative treatment ofMechanical loosening of prosthetic hip. Patient has severe unremitting pain that affects sleep, daily activities, and work/hobbies. After pre-op clearance the patient was taken to the operating room on 12/05/2011 and underwent  Procedure(s): TOTAL HIP REVISION.    Patient was given perioperative antibiotics: Anti-infectives     Start     Dose/Rate Route Frequency Ordered Stop   12/05/11 1400   ceFAZolin (ANCEF) IVPB 1 g/50 mL premix        1 g 100 mL/hr over 30 Minutes Intravenous Every 6 hours 12/05/11 1134 12/05/11 2032   12/04/11 1620   ceFAZolin (ANCEF) IVPB 2 g/50 mL premix        2 g 100 mL/hr over 30 Minutes Intravenous 60 min pre-op 12/04/11 1620 12/05/11 0753           Patient was given sequential compression devices, early ambulation, and chemoprophylaxis to prevent DVT.  Patient benefited maximally from hospital stay and there were no complications.    Recent vital signs: Patient Vitals for the past 24 hrs:  BP Temp Temp src Pulse Resp SpO2  12/08/11 0440 117/77 mmHg 98.2 F (36.8 C) Oral 79  20  98 %  2012/01/06 2130 138/69 mmHg 99.1 F (37.3 C) Oral 99  20  98 %  01/06/2012 1508 150/49 mmHg 98.6 F (37 C) Oral 90  18  100 %  01-06-12 1040 108/69 mmHg - - - - -  Jan 06, 2012 0800 - - - - 16  97 %      Recent laboratory studies:  Basename 12/08/11 0425 2012-01-06 0435 12/06/11 0430  WBC 9.7 11.9* --  HGB 9.5* 9.8* --  HCT 27.3* 28.3* --  PLT 142* 140* --  NA -- -- 127*  K -- -- 5.2*  CL -- -- 100  CO2 -- -- 20  BUN -- -- 11  CREATININE -- -- 0.94  GLUCOSE -- -- 193*  INR -- -- --  CALCIUM -- -- 7.3*     Discharge Medications:     Medication List     As of 12/08/2011  7:40 AM    STOP taking these medications         aspirin 81 MG tablet      TAKE these medications         aspirin 325 MG EC tablet   Take 1 tablet (325 mg total) by mouth daily.      calcium-vitamin D 500-200 MG-UNIT per tablet   Commonly known as: OSCAL WITH D   Take 1 tablet by mouth daily.      cetirizine 10 MG tablet   Commonly known as: ZYRTEC   Take 10 mg by mouth daily as needed. For allergies      ferrous sulfate 325 (65 FE) MG tablet   Take 1 tablet (325  mg total) by mouth 3 (three) times daily after meals.      glipiZIDE 5 MG 24 hr tablet   Commonly known as: GLUCOTROL XL   Take 5 mg by mouth daily.      lisinopril 40 MG tablet   Commonly known as: PRINIVIL,ZESTRIL   Take 40 mg by mouth every morning.      metFORMIN 1000 MG tablet   Commonly known as: GLUCOPHAGE   Take 1,000 mg by mouth 2 (two) times daily with a meal.      methocarbamol 500 MG tablet   Commonly known as: ROBAXIN   Take 1 tablet (500 mg total) by mouth every 6 (six) hours as needed.      oxyCODONE-acetaminophen 5-325 MG per tablet   Commonly known as: PERCOCET/ROXICET   Take 1-2 tablets by mouth every 4 (four) hours as needed for pain.      pravastatin 40 MG tablet   Commonly known as: PRAVACHOL   Take 40 mg by mouth at bedtime.      sitaGLIPtin 100 MG tablet   Commonly known as: JANUVIA   Take 100 mg by mouth every morning.      traMADol 50 MG tablet   Commonly known as: ULTRAM   Take 100 mg by mouth every 8 (eight) hours as needed. For pain      VITAMIN D-400 PO   Take 400 Units by mouth  daily.        Diagnostic Studies: Dg Chest 2 View  12/04/2011  *RADIOLOGY REPORT*  Clinical Data: History of hypertension.  Preoperative evaluation prior to hip surgery.  CHEST - 2 VIEW  Comparison: Chest x-ray 05/16/2011.  Findings: Lung volumes are normal.  No consolidative airspace disease.  No pleural effusions.  No pneumothorax.  No pulmonary nodule or mass noted.  Pulmonary vasculature and the cardiomediastinal silhouette are within normal limits. Atherosclerotic calcifications are noted within the arch of the aorta.  IMPRESSION: 1. No radiographic evidence of acute cardiopulmonary disease. 2.  Atherosclerosis.   Original Report Authenticated By: Florencia Reasons, M.D.    Dg Pelvis Portable  12/05/2011  *RADIOLOGY REPORT*  Clinical Data: Right total hip arthroplasty.  Postoperative radiographs.  PORTABLE PELVIS  Comparison: 07/06/2010.  Findings: Revision right hip arthroplasty is present.  There is a new right total hip arthroplasty.  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  Right acetabular screw is now present.  The right hip is located. There is no periprosthetic fracture.  Expected postsurgical changes in the soft tissues.  IMPRESSION: Uncomplicated new revision right total hip arthroplasty.   Original Report Authenticated By: Andreas Newport, M.D.    Dg Hip Portable 1 View Right  12/05/2011  *RADIOLOGY REPORT*  Clinical Data: Revision right total hip arthroplasty.  PORTABLE RIGHT HIP - 1 VIEW  Comparison: 07/06/2010.  Findings: New right total hip arthroplasty is present with acetabular cup screws.  The  Hip is located.  There is no periprosthetic fracture.  IMPRESSION: Uncomplicated new revision right total hip arthroplasty.   Original Report Authenticated By: Andreas Newport, M.D.     Disposition: 01-Home or Self Care      Discharge Orders    Future Appointments: Provider: Department: Dept Phone: Center:   12/16/2011 9:45 AM Amy Pool, PT [facility] [phone] [facility]     12/31/2011 8:30 AM [facility] [phone] [facility]   01/14/2012 8:30 AM Beverley Fiedler, MD [facility] [phone] [facility]     Future Orders Please Complete  By Expires   Diet - low sodium heart healthy      Call MD / Call 911      Comments:   If you experience chest pain or shortness of breath, CALL 911 and be transported to the hospital emergency room.  If you develope a fever above 101 F, pus (white drainage) or increased drainage or redness at the wound, or calf pain, call your surgeon's office.   Constipation Prevention      Comments:   Drink plenty of fluids.  Prune juice may be helpful.  You may use a stool softener, such as Colace (over the counter) 100 mg twice a day.  Use MiraLax (over the counter) for constipation as needed.   Increase activity slowly as tolerated      Discharge instructions      Comments:   Increase activities as tolerated. Expect right leg swelling and drainage from your incision. Leave the current dressing in place for the next 2-3 days, then place new dressing. Take an over-the-counter stool softener daily.   Discharge patient         Follow-up Information    Follow up with Kathryne Hitch, MD. In 2 weeks. [facility])    Contact information:   [facility] [address] [place] [address] [phone]           Signed: Kathryne Hitch 12/08/2011, 7:40 AM

## 2011-12-08 NOTE — Progress Notes (Signed)
Physical Therapy Treatment Patient Details Name: Joyce Gilmore MRN: [id number] DOB: 07-27-1934 Today's Date: 12/08/2011 Time: 4098-1191 PT Time Calculation (min): 23 min  PT Assessment / Plan / Recommendation Comments on Treatment Session  Pt continues to progress well with mobility.  Educated Sports coach on how to negotiate stairs and provided handout of stairs and exercises.  Pts daughter states that someone at home will be able to translate.  Ready for D/C.     Follow Up Recommendations  Home health PT     Does the patient have the potential to tolerate intense rehabilitation     Barriers to Discharge        Equipment Recommendations       Recommendations for Other Services    Frequency 7X/week   Plan Discharge plan remains appropriate    Precautions / Restrictions Precautions Precautions: Anterior Hip;Fall Precaution Booklet Issued: Yes (comment) Precaution Comments: Continue to educate on hip precautions.  Unable to find spanish handout Restrictions Weight Bearing Restrictions: No Other Position/Activity Restrictions: WBAT   Pertinent Vitals/Pain 2/10, however per pts facial expression and sounds with ambulation, suspect it is higher.     Mobility  Bed Mobility Bed Mobility: Not assessed Transfers Transfers: Sit to Stand;Stand to Sit Sit to Stand: 4: Min guard;With upper extremity assist;From chair/3-in-1;With armrests Stand to Sit: 4: Min guard;With upper extremity assist;To chair/3-in-1;With armrests Details for Transfer Assistance: Arrived to assist pt up from 3in1 (standing x 2 reps) with min cues for hand placement and safety when sitting/standing.  Ambulation/Gait Ambulation/Gait Assistance: 4: Min guard Ambulation Distance (Feet): 120 Feet Assistive device: Rolling walker Ambulation/Gait Assistance Details: Min cues for sequencing/technique with RW, however much improved from previous sessions.  Gait Pattern: Step-to pattern;Decreased stance  time - right;Decreased step length - left;Decreased stride length;Trunk flexed;Narrow base of support Gait velocity: decreased Stairs: Yes Stairs Assistance: 4: Min assist Stairs Assistance Details (indicate cue type and reason): Cues for sequencing/technique with two handrails.  Provided handout in English.  Pts daughter educated on how to assist and she states that there is someone at home that can translate stair handout.  Stair Management Technique: Two rails;Step to pattern;Forwards Number of Stairs: 3     Exercises     PT Diagnosis:    PT Problem List:   PT Treatment Interventions:     PT Goals Acute Rehab PT Goals PT Goal Formulation: With patient/family Time For Goal Achievement: 12/13/11 Potential to Achieve Goals: Good Pt will go Sit to Stand: with supervision PT Goal: Sit to Stand - Progress: Progressing toward goal Pt will go Stand to Sit: with supervision PT Goal: Stand to Sit - Progress: Progressing toward goal Pt will Ambulate: 51 - 150 feet;with supervision;with least restrictive assistive device PT Goal: Ambulate - Progress: Progressing toward goal Pt will Go Up / Down Stairs: 3-5 stairs;with min assist;with rail(s);with least restrictive assistive device PT Goal: Up/Down Stairs - Progress: Met  Visit Information  Last PT Received On: 12/08/11 Assistance Needed: +1    Subjective Data  Subjective: Pt is spanish speaking, daughter translates Patient Stated Goal: n/a   Cognition  Overall Cognitive Status: Difficult to assess Difficult to assess due to: Non-English speaking Arousal/Alertness: Awake/alert Orientation Level: Appears intact for tasks assessed Behavior During Session: [facility] for tasks performed    Balance     End of Session PT - End of Session Activity Tolerance: Patient limited by pain Patient left: in chair;with call bell/phone within reach;with family/visitor present Nurse Communication: Mobility status  GP     Page, Meribeth Mattes 12/08/2011, 11:01 AM

## 2011-12-08 NOTE — Progress Notes (Signed)
Occupational Therapy Treatment Patient Details Name: Joyce Gilmore MRN: [id number] DOB: 08-14-1934 Today's Date: 12/08/2011 Time: 4098-1191 OT Time Calculation (min): 19 min  OT Assessment / Plan / Recommendation Comments on Treatment Session Pt progressing slowly. Recommend HHOT for f/u.    Follow Up Recommendations  Home health OT;Supervision/Assistance - 24 hour    Barriers to Discharge       Equipment Recommendations  Tub/shower bench    Recommendations for Other Services    Frequency Min 2X/week   Plan Discharge plan needs to be updated    Precautions / Restrictions Precautions Precautions: Anterior Hip;Fall Restrictions Weight Bearing Restrictions: No   Pertinent Vitals/Pain Reported 3/10 pain. Repositioned for comfort.    ADL  Grooming: Performed;Min guard;Wash/dry hands Where Assessed - Grooming: Supported Copywriter, advertising: Performed;Minimal Dentist Method: Sit to Barista: Regular height toilet;Grab bars Toileting - Architect and Hygiene: Performed;Minimal assistance Where Assessed - Engineer, mining and Hygiene: Sit to stand from 3-in-1 or toilet Transfers/Ambulation Related to ADLs: Pt ambulated to the bathroom with minguard A ADL Comments: Provided pt with all necessary AE.    OT Diagnosis:    OT Problem List:   OT Treatment Interventions:     OT Goals ADL Goals ADL Goal: Grooming - Progress: Progressing toward goals Miscellaneous OT Goals OT Goal: Miscellaneous Goal #1 - Progress: Progressing toward goals  Visit Information  Last OT Received On: 12/08/11 Assistance Needed: +1 PT/OT Co-Evaluation/Treatment: Yes    Subjective Data  Subjective: Pt speaks Spanish   Prior Functioning       Cognition  Overall Cognitive Status: Difficult to assess Difficult to assess due to: Non-English speaking Arousal/Alertness: Awake/alert Orientation Level: Appears  intact for tasks assessed Behavior During Session: [facility] for tasks performed    Mobility  Shoulder Instructions Transfers Sit to Stand: 4: Min assist;With upper extremity assist;From toilet Stand to Sit: 4: Min assist;With upper extremity assist;To chair/3-in-1 Details for Transfer Assistance: Min VCs for hand placement and safety.       Exercises      Balance     End of Session OT - End of Session Equipment Utilized During Treatment: Gait belt Activity Tolerance: Patient tolerated treatment well Patient left: in chair;with call bell/phone within reach;with family/visitor present  GO     Joyce Gilmore A OTR/L [phone] 12/08/2011, 10:57 AM

## 2011-12-16 ENCOUNTER — Ambulatory Visit: Payer: Medicaid Other | Attending: Family Medicine | Admitting: Audiology

## 2011-12-16 ENCOUNTER — Ambulatory Visit: Payer: Medicaid Other | Admitting: Physical Therapy

## 2011-12-16 DIAGNOSIS — H903 Sensorineural hearing loss, bilateral: Secondary | ICD-10-CM | POA: Insufficient documentation

## 2011-12-22 ENCOUNTER — Inpatient Hospital Stay (HOSPITAL_COMMUNITY)
Admission: AD | Admit: 2011-12-22 | Discharge: 2011-12-29 | DRG: 501 | Disposition: A | Discharge status: Home Health | Payer: Medicaid Other | Source: Ambulatory Visit | Attending: Orthopaedic Surgery | Admitting: Orthopaedic Surgery

## 2011-12-22 ENCOUNTER — Encounter (HOSPITAL_COMMUNITY): Payer: Self-pay | Admitting: Certified Registered Nurse Anesthetist

## 2011-12-22 ENCOUNTER — Encounter (HOSPITAL_COMMUNITY)
Admission: AD | Disposition: A | Discharge status: Home Health | Payer: Self-pay | Source: Ambulatory Visit | Attending: Orthopaedic Surgery

## 2011-12-22 ENCOUNTER — Inpatient Hospital Stay (HOSPITAL_COMMUNITY): Payer: Medicaid Other | Admitting: Certified Registered Nurse Anesthetist

## 2011-12-22 ENCOUNTER — Other Ambulatory Visit (HOSPITAL_COMMUNITY): Payer: Self-pay | Admitting: Orthopaedic Surgery

## 2011-12-22 DIAGNOSIS — Z96649 Presence of unspecified artificial hip joint: Secondary | ICD-10-CM

## 2011-12-22 DIAGNOSIS — E119 Type 2 diabetes mellitus without complications: Secondary | ICD-10-CM | POA: Diagnosis present

## 2011-12-22 DIAGNOSIS — Z7982 Long term (current) use of aspirin: Secondary | ICD-10-CM

## 2011-12-22 DIAGNOSIS — Z96659 Presence of unspecified artificial knee joint: Secondary | ICD-10-CM

## 2011-12-22 DIAGNOSIS — N189 Chronic kidney disease, unspecified: Secondary | ICD-10-CM | POA: Diagnosis present

## 2011-12-22 DIAGNOSIS — A4902 Methicillin resistant Staphylococcus aureus infection, unspecified site: Secondary | ICD-10-CM | POA: Diagnosis present

## 2011-12-22 DIAGNOSIS — I129 Hypertensive chronic kidney disease with stage 1 through stage 4 chronic kidney disease, or unspecified chronic kidney disease: Secondary | ICD-10-CM | POA: Diagnosis present

## 2011-12-22 DIAGNOSIS — T8450XA Infection and inflammatory reaction due to unspecified internal joint prosthesis, initial encounter: Principal | ICD-10-CM | POA: Diagnosis present

## 2011-12-22 DIAGNOSIS — Y9210 Unspecified residential institution as the place of occurrence of the external cause: Secondary | ICD-10-CM | POA: Diagnosis present

## 2011-12-22 DIAGNOSIS — Z79899 Other long term (current) drug therapy: Secondary | ICD-10-CM

## 2011-12-22 DIAGNOSIS — M129 Arthropathy, unspecified: Secondary | ICD-10-CM | POA: Diagnosis present

## 2011-12-22 DIAGNOSIS — T8489XA Other specified complication of internal orthopedic prosthetic devices, implants and grafts, initial encounter: Secondary | ICD-10-CM | POA: Diagnosis present

## 2011-12-22 DIAGNOSIS — T8451XA Infection and inflammatory reaction due to internal right hip prosthesis, initial encounter: Secondary | ICD-10-CM

## 2011-12-22 DIAGNOSIS — L02419 Cutaneous abscess of limb, unspecified: Secondary | ICD-10-CM | POA: Diagnosis present

## 2011-12-22 DIAGNOSIS — Y831 Surgical operation with implant of artificial internal device as the cause of abnormal reaction of the patient, or of later complication, without mention of misadventure at the time of the procedure: Secondary | ICD-10-CM | POA: Diagnosis present

## 2011-12-22 HISTORY — PX: INCISION AND DRAINAGE HIP: SHX1801

## 2011-12-22 LAB — BASIC METABOLIC PANEL
CO2: 23 mEq/L (ref 19–32)
Calcium: 9.1 mg/dL (ref 8.4–10.5)
Creatinine, Ser: 0.75 mg/dL (ref 0.50–1.10)
GFR calc non Af Amer: 81 mL/min — ABNORMAL LOW (ref >90)
Glucose, Bld: 135 mg/dL — ABNORMAL HIGH (ref 70–99)
Sodium: 129 mEq/L — ABNORMAL LOW (ref 135–145)

## 2011-12-22 LAB — GRAM STAIN

## 2011-12-22 LAB — URINALYSIS, ROUTINE W REFLEX MICROSCOPIC
Glucose, UA: NEGATIVE mg/dL
Hgb urine dipstick: NEGATIVE
pH: 5.5 (ref 5.0–8.0)

## 2011-12-22 LAB — CBC
HCT: 30.6 % — ABNORMAL LOW (ref 36.0–46.0)
Hemoglobin: 10.2 g/dL — ABNORMAL LOW (ref 12.0–15.0)
MCH: 27.9 pg (ref 26.0–34.0)
RBC: 3.66 MIL/uL — ABNORMAL LOW (ref 3.87–5.11)

## 2011-12-22 LAB — GLUCOSE, CAPILLARY
Glucose-Capillary: 122 mg/dL — ABNORMAL HIGH (ref 70–99)
Glucose-Capillary: 117 mg/dL — ABNORMAL HIGH (ref 70–99)

## 2011-12-22 LAB — URINE MICROSCOPIC-ADD ON

## 2011-12-22 LAB — SURGICAL PCR SCREEN: Staphylococcus aureus: NEGATIVE

## 2011-12-22 SURGERY — IRRIGATION AND DEBRIDEMENT HIP
Anesthesia: General | Site: Hip | Laterality: Right | Wound class: Dirty or Infected

## 2011-12-22 MED ORDER — MEPERIDINE HCL 25 MG/ML IJ SOLN: 6.2500 mg | INTRAMUSCULAR | Status: DC | PRN

## 2011-12-22 MED ORDER — HYDROMORPHONE HCL PF 1 MG/ML IJ SOLN
INTRAMUSCULAR | Status: AC
  Administered 2011-12-22: 0.5 mg via INTRAVENOUS
  Filled 2011-12-22: qty 1

## 2011-12-22 MED ORDER — SODIUM CHLORIDE 0.9 % IV SOLN
INTRAVENOUS | Status: DC | PRN
  Administered 2011-12-22: 19:00:00 via INTRAVENOUS

## 2011-12-22 MED ORDER — LORATADINE 10 MG PO TABS
10.0000 mg | ORAL_TABLET | Freq: Every day | ORAL | Status: DC
  Administered 2011-12-23 – 2011-12-29 (×7): 10 mg via ORAL
  Filled 2011-12-22 (×7): qty 1

## 2011-12-22 MED ORDER — LISINOPRIL 40 MG PO TABS
40.0000 mg | ORAL_TABLET | Freq: Every day | ORAL | Status: DC
  Administered 2011-12-23 – 2011-12-29 (×6): 40 mg via ORAL
  Filled 2011-12-22 (×7): qty 1

## 2011-12-22 MED ORDER — METHOCARBAMOL 500 MG PO TABS
500.0000 mg | ORAL_TABLET | Freq: Four times a day (QID) | ORAL | Status: DC | PRN
  Administered 2011-12-28: 500 mg via ORAL
  Filled 2011-12-22: qty 1

## 2011-12-22 MED ORDER — VANCOMYCIN HCL 1000 MG IV SOLR
INTRAVENOUS | Status: AC
  Filled 2011-12-22: qty 1000

## 2011-12-22 MED ORDER — FENTANYL CITRATE 0.05 MG/ML IJ SOLN
INTRAMUSCULAR | Status: DC | PRN
  Administered 2011-12-22: 150 ug via INTRAVENOUS

## 2011-12-22 MED ORDER — DEXTROSE 5 % IV SOLN
500.0000 mg | Freq: Four times a day (QID) | INTRAVENOUS | Status: DC | PRN
  Administered 2011-12-27: 500 mg via INTRAVENOUS
  Filled 2011-12-22 (×4): qty 5

## 2011-12-22 MED ORDER — INSULIN ASPART 100 UNIT/ML ~~LOC~~ SOLN
0.0000 [IU] | Freq: Three times a day (TID) | SUBCUTANEOUS | Status: DC
  Administered 2011-12-23: 2 [IU] via SUBCUTANEOUS
  Administered 2011-12-23 – 2011-12-25 (×5): 3 [IU] via SUBCUTANEOUS
  Administered 2011-12-26 (×2): 2 [IU] via SUBCUTANEOUS
  Administered 2011-12-27: 3 [IU] via SUBCUTANEOUS
  Administered 2011-12-27: 2 [IU] via SUBCUTANEOUS
  Administered 2011-12-28: 3 [IU] via SUBCUTANEOUS
  Administered 2011-12-29: 5 [IU] via SUBCUTANEOUS
  Administered 2011-12-29: 3 [IU] via SUBCUTANEOUS

## 2011-12-22 MED ORDER — MIDAZOLAM HCL 2 MG/2ML IJ SOLN: 0.5000 mg | Freq: Once | INTRAMUSCULAR | Status: DC | PRN

## 2011-12-22 MED ORDER — SIMVASTATIN 20 MG PO TABS
20.0000 mg | ORAL_TABLET | Freq: Every day | ORAL | Status: DC
  Administered 2011-12-23 – 2011-12-27 (×5): 20 mg via ORAL
  Filled 2011-12-22 (×7): qty 1

## 2011-12-22 MED ORDER — OXYCODONE HCL 5 MG/5ML PO SOLN: 5.0000 mg | Freq: Once | ORAL | Status: DC | PRN

## 2011-12-22 MED ORDER — ZOLPIDEM TARTRATE 5 MG PO TABS: 5.0000 mg | ORAL_TABLET | Freq: Every evening | ORAL | Status: DC | PRN

## 2011-12-22 MED ORDER — HYDROCODONE-ACETAMINOPHEN 5-325 MG PO TABS
1.0000 | ORAL_TABLET | ORAL | Status: DC | PRN
  Administered 2011-12-23 (×3): 2 via ORAL
  Administered 2011-12-24 (×2): 1 via ORAL
  Administered 2011-12-25 – 2011-12-27 (×5): 2 via ORAL
  Filled 2011-12-22 (×4): qty 2
  Filled 2011-12-22: qty 1
  Filled 2011-12-22 (×2): qty 2
  Filled 2011-12-22: qty 1
  Filled 2011-12-22 (×2): qty 2

## 2011-12-22 MED ORDER — GENTAMICIN SULFATE 40 MG/ML IJ SOLN
INTRAMUSCULAR | Status: AC
  Filled 2011-12-22: qty 4

## 2011-12-22 MED ORDER — LINAGLIPTIN 5 MG PO TABS
5.0000 mg | ORAL_TABLET | Freq: Every day | ORAL | Status: DC
  Administered 2011-12-23 – 2011-12-29 (×7): 5 mg via ORAL
  Filled 2011-12-22 (×7): qty 1

## 2011-12-22 MED ORDER — 0.9 % SODIUM CHLORIDE (POUR BTL) OPTIME
TOPICAL | Status: DC | PRN
  Administered 2011-12-22: 1000 mL

## 2011-12-22 MED ORDER — NEOSTIGMINE METHYLSULFATE 1 MG/ML IJ SOLN
INTRAMUSCULAR | Status: DC | PRN
  Administered 2011-12-22: 3.5 mg via INTRAVENOUS

## 2011-12-22 MED ORDER — GENTAMICIN SULFATE 40 MG/ML IJ SOLN
INTRAMUSCULAR | Status: DC | PRN
  Administered 2011-12-22 (×2): 80 mg

## 2011-12-22 MED ORDER — DOCUSATE SODIUM 100 MG PO CAPS
100.0000 mg | ORAL_CAPSULE | Freq: Two times a day (BID) | ORAL | Status: DC
  Administered 2011-12-23 – 2011-12-29 (×13): 100 mg via ORAL
  Filled 2011-12-22 (×11): qty 1

## 2011-12-22 MED ORDER — VANCOMYCIN HCL IN DEXTROSE 1-5 GM/200ML-% IV SOLN
INTRAVENOUS | Status: AC
  Filled 2011-12-22: qty 200

## 2011-12-22 MED ORDER — OXYCODONE HCL 5 MG PO TABS: 5.0000 mg | ORAL_TABLET | Freq: Once | ORAL | Status: DC | PRN

## 2011-12-22 MED ORDER — OXYCODONE HCL 5 MG PO TABS
5.0000 mg | ORAL_TABLET | ORAL | Status: DC | PRN
Start: 2011-12-22 — End: 2011-12-29
  Administered 2011-12-27 (×2): 10 mg via ORAL
  Administered 2011-12-28: 5 mg via ORAL
  Filled 2011-12-22: qty 2
  Filled 2011-12-22 (×2): qty 1

## 2011-12-22 MED ORDER — DIPHENHYDRAMINE HCL 12.5 MG/5ML PO ELIX
12.5000 mg | ORAL_SOLUTION | ORAL | Status: DC | PRN
  Filled 2011-12-22: qty 10

## 2011-12-22 MED ORDER — MORPHINE SULFATE 2 MG/ML IJ SOLN
1.0000 mg | INTRAMUSCULAR | Status: DC | PRN
  Administered 2011-12-23 – 2011-12-27 (×4): 1 mg via INTRAVENOUS
  Filled 2011-12-22 (×4): qty 1

## 2011-12-22 MED ORDER — METFORMIN HCL 500 MG PO TABS
1000.0000 mg | ORAL_TABLET | Freq: Two times a day (BID) | ORAL | Status: DC
  Administered 2011-12-23 – 2011-12-29 (×11): 1000 mg via ORAL
  Filled 2011-12-22 (×15): qty 2

## 2011-12-22 MED ORDER — HYDROMORPHONE HCL PF 1 MG/ML IJ SOLN
0.2500 mg | INTRAMUSCULAR | Status: DC | PRN
  Administered 2011-12-22 (×4): 0.5 mg via INTRAVENOUS

## 2011-12-22 MED ORDER — ROCURONIUM BROMIDE 100 MG/10ML IV SOLN
INTRAVENOUS | Status: DC | PRN
  Administered 2011-12-22: 40 mg via INTRAVENOUS

## 2011-12-22 MED ORDER — FERROUS SULFATE 325 (65 FE) MG PO TABS
325.0000 mg | ORAL_TABLET | Freq: Three times a day (TID) | ORAL | Status: DC
  Administered 2011-12-23 – 2011-12-29 (×17): 325 mg via ORAL
  Filled 2011-12-22 (×22): qty 1

## 2011-12-22 MED ORDER — SODIUM CHLORIDE 0.9 % IV SOLN
INTRAVENOUS | Status: DC
  Administered 2011-12-23: via INTRAVENOUS
  Administered 2011-12-23 – 2011-12-26 (×2): 20 mL/h via INTRAVENOUS

## 2011-12-22 MED ORDER — ASPIRIN EC 325 MG PO TBEC
325.0000 mg | DELAYED_RELEASE_TABLET | Freq: Every day | ORAL | Status: DC
  Administered 2011-12-23 – 2011-12-29 (×7): 325 mg via ORAL
  Filled 2011-12-22 (×7): qty 1

## 2011-12-22 MED ORDER — GLIPIZIDE ER 5 MG PO TB24
5.0000 mg | ORAL_TABLET | Freq: Every day | ORAL | Status: DC
  Administered 2011-12-23 – 2011-12-29 (×6): 5 mg via ORAL
  Filled 2011-12-22 (×8): qty 1

## 2011-12-22 MED ORDER — MUPIROCIN 2 % EX OINT
TOPICAL_OINTMENT | Freq: Two times a day (BID) | CUTANEOUS | Status: DC
  Administered 2011-12-23: 1 via NASAL
  Administered 2011-12-23 – 2011-12-29 (×12): via NASAL
  Filled 2011-12-22: qty 22

## 2011-12-22 MED ORDER — INSULIN ASPART 100 UNIT/ML ~~LOC~~ SOLN: 0.0000 [IU] | Freq: Every day | SUBCUTANEOUS | Status: DC

## 2011-12-22 MED ORDER — PROPOFOL 10 MG/ML IV BOLUS
INTRAVENOUS | Status: DC | PRN
  Administered 2011-12-22: 50 mg via INTRAVENOUS
  Administered 2011-12-22: 100 mg via INTRAVENOUS

## 2011-12-22 MED ORDER — SODIUM CHLORIDE 0.9 % IR SOLN
Status: DC | PRN
  Administered 2011-12-22 (×3): 3000 mL

## 2011-12-22 MED ORDER — LACTATED RINGERS IV SOLN
INTRAVENOUS | Status: DC | PRN
  Administered 2011-12-22: 17:00:00 via INTRAVENOUS

## 2011-12-22 MED ORDER — ONDANSETRON HCL 4 MG/2ML IJ SOLN
INTRAMUSCULAR | Status: DC | PRN
  Administered 2011-12-22: 4 mg via INTRAVENOUS

## 2011-12-22 MED ORDER — PROMETHAZINE HCL 25 MG/ML IJ SOLN: 6.2500 mg | INTRAMUSCULAR | Status: DC | PRN

## 2011-12-22 MED ORDER — EPHEDRINE SULFATE 50 MG/ML IJ SOLN
INTRAMUSCULAR | Status: DC | PRN
  Administered 2011-12-22: 5 mg via INTRAVENOUS

## 2011-12-22 MED ORDER — ONDANSETRON HCL 4 MG PO TABS
4.0000 mg | ORAL_TABLET | Freq: Four times a day (QID) | ORAL | Status: DC | PRN
  Filled 2011-12-22: qty 1

## 2011-12-22 MED ORDER — METOCLOPRAMIDE HCL 10 MG PO TABS: 5.0000 mg | ORAL_TABLET | Freq: Three times a day (TID) | ORAL | Status: DC | PRN

## 2011-12-22 MED ORDER — VANCOMYCIN HCL 1000 MG IV SOLR
1250.0000 mg | INTRAVENOUS | Status: DC
  Filled 2011-12-22: qty 1250

## 2011-12-22 MED ORDER — MUPIROCIN 2 % EX OINT
TOPICAL_OINTMENT | CUTANEOUS | Status: AC
  Filled 2011-12-22: qty 22

## 2011-12-22 MED ORDER — VANCOMYCIN HCL 1000 MG IV SOLR
INTRAVENOUS | Status: DC | PRN
  Administered 2011-12-22: 1000 mg

## 2011-12-22 MED ORDER — CALCIUM CARBONATE-VITAMIN D 500-200 MG-UNIT PO TABS
1.0000 | ORAL_TABLET | Freq: Every day | ORAL | Status: DC
  Administered 2011-12-23 – 2011-12-29 (×7): 1 via ORAL
  Filled 2011-12-22 (×7): qty 1

## 2011-12-22 MED ORDER — METOCLOPRAMIDE HCL 5 MG/ML IJ SOLN: 5.0000 mg | Freq: Three times a day (TID) | INTRAMUSCULAR | Status: DC | PRN

## 2011-12-22 MED ORDER — VANCOMYCIN HCL 1000 MG IV SOLR
1000.0000 mg | INTRAVENOUS | Status: DC | PRN
  Administered 2011-12-22: 1000 mg via INTRAVENOUS

## 2011-12-22 MED ORDER — GLYCOPYRROLATE 0.2 MG/ML IJ SOLN
INTRAMUSCULAR | Status: DC | PRN
  Administered 2011-12-22: 0.4 mg via INTRAVENOUS

## 2011-12-22 MED ORDER — ONDANSETRON HCL 4 MG/2ML IJ SOLN
4.0000 mg | Freq: Four times a day (QID) | INTRAMUSCULAR | Status: DC | PRN
  Administered 2011-12-26 – 2011-12-29 (×3): 4 mg via INTRAVENOUS
  Filled 2011-12-22 (×3): qty 2

## 2011-12-22 SURGICAL SUPPLY — 45 items
BAG DECANTER FOR FLEXI CONT (MISCELLANEOUS) IMPLANT
CANISTER WOUND CARE 500ML ATS (WOUND CARE) ×2 IMPLANT
CLOTH BEACON ORANGE TIMEOUT ST (SAFETY) ×2 IMPLANT
COVER SURGICAL LIGHT HANDLE (MISCELLANEOUS) ×2 IMPLANT
DRAPE ORTHO SPLIT 77X108 STRL (DRAPES) ×2
DRAPE SURG ORHT 6 SPLT 77X108 (DRAPES) ×2 IMPLANT
DRAPE U-SHAPE 47X51 STRL (DRAPES) ×2 IMPLANT
DRSG ADAPTIC 3X8 NADH LF (GAUZE/BANDAGES/DRESSINGS) ×2 IMPLANT
DRSG PAD ABDOMINAL 8X10 ST (GAUZE/BANDAGES/DRESSINGS) IMPLANT
DRSG VAC ATS SM SENSATRAC (GAUZE/BANDAGES/DRESSINGS) ×2 IMPLANT
DURAPREP 26ML APPLICATOR (WOUND CARE) ×2 IMPLANT
ELECT CAUTERY BLADE 6.4 (BLADE) IMPLANT
ELECT REM PT RETURN 9FT ADLT (ELECTROSURGICAL)
ELECTRODE REM PT RTRN 9FT ADLT (ELECTROSURGICAL) IMPLANT
GLOVE BIOGEL M 8.0 STRL (GLOVE) ×4 IMPLANT
GLOVE BIOGEL PI IND STRL 8 (GLOVE) ×1 IMPLANT
GLOVE BIOGEL PI INDICATOR 8 (GLOVE) ×1
GOWN PREVENTION PLUS LG XLONG (DISPOSABLE) IMPLANT
GOWN PREVENTION PLUS XLARGE (GOWN DISPOSABLE) ×2 IMPLANT
GOWN STRL NON-REIN LRG LVL3 (GOWN DISPOSABLE) ×2 IMPLANT
HANDPIECE INTERPULSE COAX TIP (DISPOSABLE) ×1
KIT BASIN OR (CUSTOM PROCEDURE TRAY) ×2 IMPLANT
KIT ROOM TURNOVER OR (KITS) ×2 IMPLANT
KIT STIMULAN RAPID CURE  10CC (Orthopedic Implant) ×1 IMPLANT
KIT STIMULAN RAPID CURE 10CC (Orthopedic Implant) ×1 IMPLANT
MANIFOLD NEPTUNE II (INSTRUMENTS) ×2 IMPLANT
NS IRRIG 1000ML POUR BTL (IV SOLUTION) ×2 IMPLANT
PACK TOTAL JOINT (CUSTOM PROCEDURE TRAY) ×2 IMPLANT
PAD ARMBOARD 7.5X6 YLW CONV (MISCELLANEOUS) ×4 IMPLANT
SET HNDPC FAN SPRY TIP SCT (DISPOSABLE) ×1 IMPLANT
SPONGE GAUZE 4X4 12PLY (GAUZE/BANDAGES/DRESSINGS) IMPLANT
SPONGE LAP 18X18 X RAY DECT (DISPOSABLE) ×2 IMPLANT
STAPLER VISISTAT 35W (STAPLE) ×2 IMPLANT
SUT ETHIBOND NAB CT1 #1 30IN (SUTURE) ×4 IMPLANT
SUT ETHILON 2 0 FS 18 (SUTURE) ×6 IMPLANT
SUT VIC AB 0 CT1 27 (SUTURE) ×1
SUT VIC AB 0 CT1 27XBRD ANBCTR (SUTURE) ×1 IMPLANT
SUT VIC AB 1 CTB1 27 (SUTURE) ×4 IMPLANT
SUT VIC AB 2-0 CT1 27 (SUTURE) ×4
SUT VIC AB 2-0 CT1 TAPERPNT 27 (SUTURE) ×4 IMPLANT
TOWEL OR 17X24 6PK STRL BLUE (TOWEL DISPOSABLE) ×2 IMPLANT
TOWEL OR 17X26 10 PK STRL BLUE (TOWEL DISPOSABLE) ×2 IMPLANT
TUBE ANAEROBIC SPECIMEN COL (MISCELLANEOUS) IMPLANT
UNDERPAD 30X30 INCONTINENT (UNDERPADS AND DIAPERS) ×2 IMPLANT
WATER STERILE IRR 1000ML POUR (IV SOLUTION) IMPLANT

## 2011-12-22 NOTE — Brief Op Note (Signed)
12/22/2011  7:15 PM  PATIENT:  Marlynn Perking Kelner  77 y.o. female  PRE-OPERATIVE DIAGNOSIS:  Right hip infection post revision arthroplasty  POST-OPERATIVE DIAGNOSIS:  Right hip infection post revision arthroplasty  PROCEDURE:  Procedure(s) (LRB) with comments: IRRIGATION AND DEBRIDEMENT HIP (Right) - Irrigation and debridement right hip  SURGEON:  Surgeon(s) and Role:    * Kathryne Hitch, MD - Primary  PHYSICIAN ASSISTANT:   ASSISTANTS: none   ANESTHESIA:   general  EBL:   <100 cc  BLOOD ADMINISTERED:none  DRAINS: none   LOCAL MEDICATIONS USED:  NONE  SPECIMEN:  No Specimen  DISPOSITION OF SPECIMEN:  N/A  COUNTS:  YES  TOURNIQUET:  * No tourniquets in log *  DICTATION: .Other Dictation: Dictation Number [id number]  PLAN OF CARE: Admit to inpatient   PATIENT DISPOSITION:  PACU - hemodynamically stable.   Delay start of Pharmacological VTE agent (>24hrs) due to surgical blood loss or risk of bleeding: yes

## 2011-12-22 NOTE — Anesthesia Preprocedure Evaluation (Signed)
Anesthesia Evaluation  Patient identified by MRN, date of birth, ID band Patient awake    Reviewed: Allergy & Precautions, H&P , NPO status , Patient's Chart, lab work & pertinent test results  History of Anesthesia Complications (+) PONV  Airway Mallampati: II TM Distance: >3 FB Neck ROM: Full    Dental  (+) Partial Upper, Partial Lower and Dental Advisory Given   Pulmonary neg pulmonary ROS, neg pneumonia -,  breath sounds clear to auscultation  Pulmonary exam normal       Cardiovascular hypertension, Pt. on medications Rhythm:Regular Rate:Normal     Neuro/Psych PSYCHIATRIC DISORDERS Depression negative neurological ROS     GI/Hepatic negative GI ROS, Neg liver ROS,   Endo/Other  diabetes, Well Controlled, Type 2, Oral Hypoglycemic AgentsMorbid obesity  Renal/GU negative Renal ROS     Musculoskeletal   Abdominal (+) + obese,   Peds  Hematology negative hematology ROS (+)   Anesthesia Other Findings   Reproductive/Obstetrics                           Anesthesia Physical Anesthesia Plan  ASA: II  Anesthesia Plan: General   Post-op Pain Management:    Induction: Intravenous  Airway Management Planned: Oral ETT  Additional Equipment:   Intra-op Plan:   Post-operative Plan: Extubation in OR  Informed Consent: I have reviewed the patients History and Physical, chart, labs and discussed the procedure including the risks, benefits and alternatives for the proposed anesthesia with the patient or authorized representative who has indicated his/her understanding and acceptance.   Dental advisory given  Plan Discussed with: CRNA and Surgeon  Anesthesia Plan Comments: (Plan routine monitors, GETA)        Anesthesia Quick Evaluation

## 2011-12-22 NOTE — Anesthesia Procedure Notes (Signed)
Procedure Name: Intubation Date/Time: 12/22/2011 5:44 PM Performed by: Margaree Mackintosh Pre-anesthesia Checklist: Patient identified, Timeout performed, Emergency Drugs available, Suction available and Patient being monitored Patient Re-evaluated:Patient Re-evaluated prior to inductionOxygen Delivery Method: Circle system utilized Preoxygenation: Pre-oxygenation with 100% oxygen Intubation Type: IV induction Ventilation: Mask ventilation without difficulty Laryngoscope Size: Mac and 3 Grade View: Grade I Tube type: Oral Tube size: 7.5 mm Number of attempts: 1 Airway Equipment and Method: Stylet Placement Confirmation: ETT inserted through vocal cords under direct vision,  positive ETCO2 and breath sounds checked- equal and bilateral Secured at: 22 cm Tube secured with: Tape Dental Injury: Teeth and Oropharynx as per pre-operative assessment

## 2011-12-22 NOTE — H&P (Signed)
Joyce Gilmore is an 77 y.o. female.   Chief Complaint:   Right hip redness and drainage HPI:   77 yo post right hip revision arthroplasty just 2 weeks ago for treatment for a loose hemiarthroplasty.  Developed redness and drainage at her incision just 2-3 days ago and presented to clinic today with cellulitis and purulent drainage.  I spoke with her and her family about going to the OR today for an urgent irrigation and debridement, culture, etc to aggressively treat the infection with the goal of retaining her implants.  The risks include blood loss, DVT, fracture, PE etc.  The goals are irradication of the infection and again, saving the impant.  Past Medical History  Diagnosis Date  . Hypertension   . Depression     hx of   . Diabetes mellitus without complication   . Arthritis   . Chronic kidney disease     hx of kidney stones,     Past Surgical History  Procedure Date  . Joint replacement     bilateral knee, right hip   . Total hip revision 12/05/2011    Procedure: TOTAL HIP REVISION;  Surgeon: Kathryne Hitch, MD;  Location: [facility] ORS;  Service: Orthopedics;  Laterality: Right;  Right Hip Revision Arthroplasty to Total Hip, Excision of Old Implant    No family history on file. Social History:  reports that she has never smoked. She has never used smokeless tobacco. She reports that she does not drink alcohol or use illicit drugs.  Allergies: No Known Allergies  Medications Prior to Admission  Medication Sig Dispense Refill  . aspirin EC 325 MG EC tablet Take 1 tablet (325 mg total) by mouth daily.  30 tablet  0  . calcium-vitamin D (OSCAL WITH D) 500-200 MG-UNIT per tablet Take 1 tablet by mouth daily.      . Cholecalciferol (VITAMIN D-400 PO) Take 400 Units by mouth daily.      . ferrous sulfate 325 (65 FE) MG tablet Take 1 tablet (325 mg total) by mouth 3 (three) times daily after meals.  20 tablet  0  . glipiZIDE (GLUCOTROL XL) 5 MG 24 hr tablet Take 5 mg  by mouth daily.      Marland Kitchen lisinopril (PRINIVIL,ZESTRIL) 40 MG tablet Take 40 mg by mouth every morning.       . metFORMIN (GLUCOPHAGE) 1000 MG tablet Take 1,000 mg by mouth 2 (two) times daily with a meal.      . methocarbamol (ROBAXIN) 500 MG tablet Take 1 tablet (500 mg total) by mouth every 6 (six) hours as needed.  60 tablet  0  . pravastatin (PRAVACHOL) 40 MG tablet Take 40 mg by mouth at bedtime.       . sitaGLIPtin (JANUVIA) 100 MG tablet Take 100 mg by mouth every morning.       . traMADol (ULTRAM) 50 MG tablet Take 100 mg by mouth every 8 (eight) hours as needed. For pain      . cetirizine (ZYRTEC) 10 MG tablet Take 10 mg by mouth daily as needed. For allergies        Results for orders placed during the hospital encounter of 12/22/11 (from the past 48 hour(s))  URINALYSIS, ROUTINE W REFLEX MICROSCOPIC     Status: Abnormal   Collection Time   12/22/11  3:12 PM      Component Value Range Comment   Color, Urine YELLOW  YELLOW    APPearance HAZY (*) CLEAR  Specific Gravity, Urine 1.011  1.005 - 1.030    pH 5.5  5.0 - 8.0    Glucose, UA NEGATIVE  NEGATIVE mg/dL    Hgb urine dipstick NEGATIVE  NEGATIVE    Bilirubin Urine NEGATIVE  NEGATIVE    Ketones, ur NEGATIVE  NEGATIVE mg/dL    Protein, ur NEGATIVE  NEGATIVE mg/dL    Urobilinogen, UA 0.2  0.0 - 1.0 mg/dL    Nitrite NEGATIVE  NEGATIVE    Leukocytes, UA LARGE (*) NEGATIVE   URINE MICROSCOPIC-ADD ON     Status: Abnormal   Collection Time   12/22/11  3:12 PM      Component Value Range Comment   Squamous Epithelial / LPF RARE  RARE    WBC, UA 21-50  <3 WBC/hpf    Bacteria, UA MANY (*) RARE   BASIC METABOLIC PANEL     Status: Abnormal   Collection Time   12/22/11  3:30 PM      Component Value Range Comment   Sodium 129 (*) 135 - 145 mEq/L    Potassium 5.5 (*) 3.5 - 5.1 mEq/L    Chloride 98  96 - 112 mEq/L    CO2 23  19 - 32 mEq/L    Glucose, Bld 135 (*) 70 - 99 mg/dL    BUN 12  6 - 23 mg/dL    Creatinine, Ser 1.61  0.50 -  1.10 mg/dL    Calcium 9.1  8.4 - 09.6 mg/dL    GFR calc non Af Amer 81 (*) >90 mL/min    GFR calc Af Amer >90  >90 mL/min   CBC     Status: Abnormal   Collection Time   12/22/11  3:30 PM      Component Value Range Comment   WBC 9.4  4.0 - 10.5 K/uL    RBC 3.66 (*) 3.87 - 5.11 MIL/uL    Hemoglobin 10.2 (*) 12.0 - 15.0 g/dL    HCT 04.5 (*) 40.9 - 46.0 %    MCV 83.6  78.0 - 100.0 fL    MCH 27.9  26.0 - 34.0 pg    MCHC 33.3  30.0 - 36.0 g/dL    RDW 81.1  91.4 - 78.2 %    Platelets 363  150 - 400 K/uL   GLUCOSE, CAPILLARY     Status: Abnormal   Collection Time   12/22/11  3:53 PM      Component Value Range Comment   Glucose-Capillary 122 (*) 70 - 99 mg/dL   GLUCOSE, CAPILLARY     Status: Abnormal   Collection Time   12/22/11  5:18 PM      Component Value Range Comment   Glucose-Capillary 117 (*) 70 - 99 mg/dL    No results found.  Review of Systems  Musculoskeletal: Positive for joint pain.  All other systems reviewed and are negative.    Blood pressure 130/52, pulse 78, temperature 97.9 F (36.6 C), resp. rate 16, height 4\' 10"  (1.473 m), weight 68.04 kg (150 lb), SpO2 100.00%. Physical Exam  Constitutional: She is oriented to person, place, and time. She appears well-developed and well-nourished.  HENT:  Head: Normocephalic and atraumatic.  Eyes: EOM are normal. Pupils are equal, round, and reactive to light.  Neck: Normal range of motion. Neck supple.  Cardiovascular: Normal rate and regular rhythm.   Respiratory: Effort normal and breath sounds normal.  GI: Soft. Bowel sounds are normal.  Musculoskeletal:       Legs:  Neurological: She is alert and oriented to person, place, and time.  Skin: Skin is warm and dry.  Psychiatric: She has a normal mood and affect.     Assessment/Plan Right hip infection 2 weeks post revision hip arthroplasty 1) to the OR for irrigation/debridment and cultures 2) admission for IV antibiotics and likely PICC line for 6 weeks IV  antibiotic  BLACKMAN,CHRISTOPHER Y 12/22/2011, 5:22 PM

## 2011-12-22 NOTE — Anesthesia Postprocedure Evaluation (Signed)
  Anesthesia Post-op Note  Patient: Joyce Gilmore  Procedure(s) Performed: Procedure(s) (LRB) with comments: IRRIGATION AND DEBRIDEMENT HIP (Right) - Irrigation and debridement right hip  Patient Location: PACU  Anesthesia Type:General  Level of Consciousness: awake  Airway and Oxygen Therapy: Patient Spontanous Breathing and Patient connected to nasal cannula oxygen  Post-op Pain: mild  Post-op Assessment: Post-op Vital signs reviewed, Patient's Cardiovascular Status Stable, Respiratory Function Stable, Patent Airway and No signs of Nausea or vomiting  Post-op Vital Signs: Reviewed and stable  Complications: No apparent anesthesia complications

## 2011-12-22 NOTE — Preoperative (Signed)
Beta Blockers   Reason not to administer Beta Blockers:Not Applicable 

## 2011-12-22 NOTE — Transfer of Care (Signed)
Immediate Anesthesia Transfer of Care Note  Patient: Joyce Gilmore  Procedure(s) Performed: Procedure(s) (LRB) with comments: IRRIGATION AND DEBRIDEMENT HIP (Right) - Irrigation and debridement right hip  Patient Location: PACU  Anesthesia Type:General  Level of Consciousness: awake, alert  and oriented  Airway & Oxygen Therapy: Patient Spontanous Breathing and Patient connected to nasal cannula oxygen  Post-op Assessment: Report given to PACU RN and Post -op Vital signs reviewed and stable  Post vital signs: Reviewed and stable  Complications: No apparent anesthesia complications

## 2011-12-22 NOTE — Progress Notes (Signed)
ANTIBIOTIC CONSULT NOTE - INITIAL  Pharmacy Consult for vancomycin  Indication: Infection following rt hip revision arthroplasty  No Known Allergies  Patient Measurements: Height: 4\' 10"  (147.3 cm) Weight: 150 lb (68.04 kg) IBW/kg (Calculated) : 40.9  Adjusted Body Weight:   Vital Signs: Temp: 97.9 F (36.6 C) (10/28 2100) BP: 134/63 mmHg (10/28 2045) Pulse Rate: 74  (10/28 2100) Intake/Output from previous day:   Intake/Output from this shift: Total I/O In: 25 [I.V.:25] Out: 150 [Urine:150]  Labs:  [facility] 12/22/11 1530  WBC 9.4  HGB 10.2*  PLT 363  LABCREA --  CREATININE 0.75   Estimated Creatinine Clearance: 49.6 ml/min (by C-G formula based on Cr of 0.75). No results found for this basename: VANCOTROUGH:2,VANCOPEAK:2,VANCORANDOM:2,GENTTROUGH:2,GENTPEAK:2,GENTRANDOM:2,TOBRATROUGH:2,TOBRAPEAK:2,TOBRARND:2,AMIKACINPEAK:2,AMIKACINTROU:2,AMIKACIN:2, in the last 72 hours   Microbiology: Recent Results (from the past 720 hour(s))  SURGICAL PCR SCREEN     Status: Abnormal   Collection Time   12/04/11  1:56 PM      Component Value Range Status Comment   MRSA, PCR NEGATIVE  NEGATIVE Final    Staphylococcus aureus POSITIVE (*) NEGATIVE Final   SURGICAL PCR SCREEN     Status: Normal   Collection Time   12/22/11  4:25 PM      Component Value Range Status Comment   MRSA, PCR NEGATIVE  NEGATIVE Final    Staphylococcus aureus NEGATIVE  NEGATIVE Final   GRAM STAIN     Status: Normal   Collection Time   12/22/11  6:03 PM      Component Value Range Status Comment   Specimen Description ABSCESS HIP RIGHT   Final    Special Requests SUPERFICIAL   Final    Gram Stain     Final    Value: FEW WBC PRESENT,BOTH PMN AND MONONUCLEAR     RARE GRAM POSITIVE COCCI IN PAIRS   Report Status 12/22/2011 FINAL   Final   GRAM STAIN     Status: Normal   Collection Time   12/22/11  6:20 PM      Component Value Range Status Comment   Specimen Description ABSCESS HIP RIGHT   Final    Special Requests DEEP   Final    Gram Stain     Final    Value: RARE WBC PRESENT,BOTH PMN AND MONONUCLEAR     NO ORGANISMS SEEN   Report Status 12/22/2011 FINAL   Final     Medical History: Past Medical History  Diagnosis Date  . Hypertension   . Depression     hx of   . Diabetes mellitus without complication   . Arthritis   . Chronic kidney disease     hx of kidney stones,     Medications:  Scheduled:    . aspirin EC  325 mg Oral Daily  . calcium-vitamin D  1 tablet Oral Daily  . docusate sodium  100 mg Oral BID  . ferrous sulfate  325 mg Oral TID PC  . glipiZIDE  5 mg Oral QAC breakfast  . insulin aspart  0-15 Units Subcutaneous TID WC  . insulin aspart  0-5 Units Subcutaneous QHS  . linagliptin  5 mg Oral Daily  . lisinopril  40 mg Oral Daily  . loratadine  10 mg Oral Daily  . metFORMIN  1,000 mg Oral BID WC  . mupirocin ointment      . mupirocin ointment   Nasal BID  . simvastatin  20 mg Oral q1800  . vancomycin  1,250 mg Intravenous Q24H  Assessment: 77 yr old female admitted 2 weeks after a rt hip revision arthroplasty for I and D of infection. Goal is to irradicate the infection and save the implant. MD wants to treat aggressively. Plan is for 6 weeks of IV antibiotic.  Goal of Therapy:  Vancomycin trough level 15-20 mcg/ml  Plan:  Vancomycin 1250 mg IV q24hrs. Levels when appropriate.  Joyce Gilmore 12/22/2011,10:05 PM

## 2011-12-23 ENCOUNTER — Encounter (HOSPITAL_COMMUNITY): Payer: Self-pay | Admitting: General Practice

## 2011-12-23 LAB — CBC
HCT: 27.4 % — ABNORMAL LOW (ref 36.0–46.0)
Hemoglobin: 8.8 g/dL — ABNORMAL LOW (ref 12.0–15.0)
MCH: 26.7 pg (ref 26.0–34.0)
MCV: 83 fL (ref 78.0–100.0)
RBC: 3.3 MIL/uL — ABNORMAL LOW (ref 3.87–5.11)

## 2011-12-23 LAB — GLUCOSE, CAPILLARY
Glucose-Capillary: 162 mg/dL — ABNORMAL HIGH (ref 70–99)
Glucose-Capillary: 110 mg/dL — ABNORMAL HIGH (ref 70–99)

## 2011-12-23 LAB — BASIC METABOLIC PANEL
CO2: 22 mEq/L (ref 19–32)
Glucose, Bld: 184 mg/dL — ABNORMAL HIGH (ref 70–99)
Potassium: 5.1 mEq/L (ref 3.5–5.1)
Sodium: 130 mEq/L — ABNORMAL LOW (ref 135–145)

## 2011-12-23 LAB — SEDIMENTATION RATE: Sed Rate: 40 mm/hr — ABNORMAL HIGH (ref 0–22)

## 2011-12-23 MED ORDER — VANCOMYCIN HCL 1000 MG IV SOLR
1250.0000 mg | INTRAVENOUS | Status: DC
  Administered 2011-12-23 – 2011-12-26 (×4): 1250 mg via INTRAVENOUS
  Filled 2011-12-23 (×4): qty 1250

## 2011-12-23 NOTE — Progress Notes (Signed)
UR of chart completed.   Pt using [facility] for [facility] previously and will use them for this need as well.  MD will order appropriate antibiotic when cultures returned. PICC to be placed today. Face to Face CMS form sent to MD for signature.

## 2011-12-23 NOTE — Progress Notes (Signed)
Patient ID: Joyce Gilmore, female   DOB: 26-Aug-1934, 77 y.o.   MRN: [id number] Looks good overall.  AF/VSS.  Hgb 8.8 (acute blood loss anemia - asymptomatic).  Superficial gram stain positive for rare gram pos cocci.  Deep gram stain negative thus far.  Will continue IV vanc for now.  VAC over closed incision.  Will not go home with VAC.  Will likely need long-term IV antibiotics (3-6 weeks).  Will order PICC line.  Can be up with therapy (anterior precautions, WBAT).

## 2011-12-23 NOTE — Progress Notes (Signed)
Physical Therapy Evaluation Patient Details Name: Joyce Gilmore MRN: [id number] DOB: [birth year] Today's Date: 12/23/2011 Time: 9147-8295 (Approx 5 minutes on BSC) PT Time Calculation (min): 52 min  PT Assessment / Plan / Recommendation Clinical Impression  77 yo female admitted with I&D Right hip secondary to infection post conversion of hemiarthroplasty to THA on 10/11; Presents with decr functional mobility and decr knowledge of prec; Will benefit for PT to maximize independence and safety with mobility to enable safe dc home with pron family assist and HHPT and RN follow up    PT Assessment  Patient needs continued PT services    Follow Up Recommendations  Home health PT    Does the patient have the potential to tolerate intense rehabilitation      Barriers to Discharge None      Equipment Recommendations  None recommended by PT    Recommendations for Other Services OT consult   Frequency 7X/week    Precautions / Restrictions Precautions Precautions: Anterior Hip;Fall Precaution Comments: Daughter assisted this therapist in writing Anterior Hip Prec in Spanish on board in room Restrictions Other Position/Activity Restrictions: WBAT   Pertinent Vitals/Pain 4/10 pain Right hip; was premedicated      Mobility  Bed Mobility Bed Mobility: Supine to Sit Supine to Sit: 3: Mod assist;With rails;HOB elevated (slightly) Sitting - Scoot to Edge of Bed: 4: Min assist Details for Bed Mobility Assistance: A for RLE off of bed and partially with trunk Transfers Transfers: Sit to Stand;Stand to Sit Sit to Stand: 4: Min assist;With upper extremity assist;From bed;From chair/3-in-1 Stand to Sit: 4: Min assist;With upper extremity assist;To chair/3-in-1 Details for Transfer Assistance: Cues for safety and hand placement; Overall quite smooth transition Ambulation/Gait Ambulation/Gait Assistance: 4: Min guard Ambulation Distance (Feet): 100 Feet Assistive device:  Rolling walker Ambulation/Gait Assistance Details: Cues for sequencing, technique, stpe-to pattern for ant prec, and to be very aware of hip prec, especially during turns; very slow-moving, but overall steady Gait Pattern: Step-to pattern Gait velocity: decreased General Gait Details: Pt tends to "toe out" with gait.     Shoulder Instructions     Exercises     PT Diagnosis: Difficulty walking;Generalized weakness;Acute pain  PT Problem List: Decreased strength;Decreased range of motion;Decreased activity tolerance;Decreased balance;Decreased mobility;Decreased knowledge of use of DME;Decreased safety awareness;Pain PT Treatment Interventions: DME instruction;Gait training;Stair training;Functional mobility training;Therapeutic activities;Therapeutic exercise;Balance training;Patient/family education   PT Goals Acute Rehab PT Goals PT Goal Formulation: With patient/family Time For Goal Achievement: 12/30/11 Potential to Achieve Goals: Good Pt will go Supine/Side to Sit: with supervision PT Goal: Supine/Side to Sit - Progress: Goal set today Pt will go Sit to Supine/Side: with supervision PT Goal: Sit to Supine/Side - Progress: Goal set today Pt will go Sit to Stand: with supervision PT Goal: Sit to Stand - Progress: Goal set today Pt will go Stand to Sit: with supervision PT Goal: Stand to Sit - Progress: Goal set today Pt will Ambulate: 51 - 150 feet;with supervision;with least restrictive assistive device PT Goal: Ambulate - Progress: Goal set today Pt will Go Up / Down Stairs: 3-5 stairs;with min assist;with rail(s);with least restrictive assistive device PT Goal: Up/Down Stairs - Progress: Goal set today  Visit Information  Last PT Received On: 12/23/11 Assistance Needed: +1    Subjective Data  Subjective: Agreeable to OOB Patient Stated Goal: Pt and daughter would like to dc home   Prior Functioning  Home Living Lives With: Family Available Help at Discharge:  Family;Available PRN/intermittently Type  of Home: House Home Access: Stairs to enter [employer] of Steps: 3 Entrance Stairs-Rails: Can reach both;Right;Left Home Layout: One level Bathroom Shower/Tub: Forensic scientist: Standard Home Adaptive Equipment: Walker - rolling;Bedside commode/3-in-1 Prior Function Level of Independence: Independent Able to Take Stairs?: Yes Driving: No Vocation: Retired Musician: Prefers language other than English Dominant Hand: Right    Cognition  Overall Cognitive Status: Appears within functional limits for tasks assessed/performed Arousal/Alertness: Awake/alert Orientation Level: Appears intact for tasks assessed Behavior During Session: [facility] for tasks performed    Extremity/Trunk Assessment Right Upper Extremity Assessment RUE ROM/Strength/Tone: Within functional levels Left Upper Extremity Assessment LUE ROM/Strength/Tone: Within functional levels Right Lower Extremity Assessment RLE ROM/Strength/Tone: Deficits RLE ROM/Strength/Tone Deficits: Generally decr AROM and strength secondary to precautions and pain postop Left Lower Extremity Assessment LLE ROM/Strength/Tone: WFL for tasks assessed   Balance    End of Session PT - End of Session Equipment Utilized During Treatment: Gait belt Activity Tolerance: Patient tolerated treatment well Patient left: in chair;with call bell/phone within reach;with family/visitor present Nurse Communication: Mobility status  GP     Olen Pel [place], [place] [phone]  12/23/2011, 12:04 PM

## 2011-12-23 NOTE — Op Note (Signed)
NAMEJANETZY, [name] NO.:  [id number]  MEDICAL RECORD NO.:  [id number]  LOCATION:  [id number]                        FACILITY:  [facility]  PHYSICIAN:  Joyce Gilmore, M.D.DATE OF BIRTH:  [birth year]  DATE OF PROCEDURE:  12/22/2011 DATE OF DISCHARGE:                              OPERATIVE REPORT   PREOPERATIVE DIAGNOSIS:  Right hip periprosthetic joint infection.  POSTOPERATIVE DIAGNOSIS:  Right hip periprosthetic joint infection.  FINDINGS: 1. Right hip incision cellulitis with superficial questionable     purulence.  Cultures pending. 2. Deep seroma right hip joint with cultures pending.  PROCEDURES: 1. Irrigation and debridement of right hip incision and soft tissue. 2. Placement of deep and superficial stimulant antibiotic beads. 3. Closure of incision and placement of incisional VAC sponge.  SURGEON:  Doneen Poisson, MD.  ANESTHESIA:  General.  BLOOD LOSS:  Less than 100 mL.  COMPLICATIONS:  None.  INDICATIONS:  Ms. Joyce Gilmore is a 77 year old female who 2 weeks ago underwent conversion of a right hip hemiarthroplasty to the whole total hip replacement.  The hemiarthroplasty was done in [place] about 3 years prior and was an Austin-Moore type prosthesis.  There was no evidence of infection, but significant loosening of prosthesis that came out of it easily as 1 unit and cement came out easily.  I then placed a long stem AML femoral component and an acetabular component and she had an uneventful hospital stay.  Her postoperative course was also been uneventful and today was her 1st postoperative visit just over 2 weeks after surgery.  The family noted that on Friday the incision looked good but on Sunday she woke and had some bleeding around the incision and felt like there was some purulence.  I saw her in the office today.  It was noted that her blood sugars were running a little bit high, but her vital signs were stable.  She was  afebrile and her white blood cell count was only 9000.  The incision was cellulitic and there was one small area that did have some gross purulence.  I felt this was some soft tissue necrosis and otherwise potentially a suture abscess.  I recommended that she undergo irrigation and debridement of the superficial and deep tissues given what we were seeing.  Risks and benefits of surgery were explained in detail and they did wish to proceed with surgery.  DESCRIPTION OF PROCEDURE:  After informed consent was obtained, appropriate right hip was marked.  She was brought to the operating room, placed supine on the operating room table.  General anesthesia was then obtained.  She was then turned over to the lateral decubitus position with the right hip up.  Her right hip was then prepped and draped with DuraPrep and sterile drapes.  A time-out was called to identify the correct patient, correct right hip.  I then removed all staples and opened up the superficial tissue and did not find significant gross purulence, but some around the small pocket around the suture and staples.  I did send this off for culture and then thoroughly irrigated the superficial tissues with 3 L of normal saline solution. Once this was accomplished, I then removed some necrotic tissue with a rongeur.  I then opened up the iliotibial band and deep in the joint and found a large seroma.  I sent deep cultures and Gram stain as well.  I then thoroughly irrigated out the deep tissues with 3 L normal saline solution followed by an additional 3 L normal saline solution using pulsatile lavage for a total of 9 L of saline throughout the wound from superficial down to deep.  Once this was thoroughly cleaned out, again I found no gross purulence deep at all and so I was able to place antibiotic stimulant beads with vancomycin and gentamicin mixture around the deep areas of implant and then I closed this tissue and provided some  in the superficial tissue as well placing antibiotic beads.  I then closed in layers all the way 2-0 nylon on the skin.  I then made incision to place Adaptic and a small VAC sponge over the incision for added wound healing of the incision.  She was then rolled back into supine position, awakened, extubated and taken to recovery room in stable condition.  All final counts were correct and there were no complications noted.  Postoperatively, she will be admitted for IV antibiotics and following her wound cultures to make a determination of type of antibiotic that she should be on and duration.     Joyce Gilmore, M.D.     CYB/MEDQ  D:  12/22/2011  T:  12/23/2011  Job:  [id number]

## 2011-12-24 LAB — BASIC METABOLIC PANEL
BUN: 12 mg/dL (ref 6–23)
Creatinine, Ser: 0.83 mg/dL (ref 0.50–1.10)
GFR calc non Af Amer: 67 mL/min — ABNORMAL LOW (ref >90)
Glucose, Bld: 137 mg/dL — ABNORMAL HIGH (ref 70–99)
Potassium: 4.8 mEq/L (ref 3.5–5.1)

## 2011-12-24 LAB — GLUCOSE, CAPILLARY
Glucose-Capillary: 186 mg/dL — ABNORMAL HIGH (ref 70–99)
Glucose-Capillary: 89 mg/dL (ref 70–99)

## 2011-12-24 LAB — CBC
Hemoglobin: 9.4 g/dL — ABNORMAL LOW (ref 12.0–15.0)
MCH: 27.6 pg (ref 26.0–34.0)
MCHC: 32.6 g/dL (ref 30.0–36.0)
RDW: 15.3 % (ref 11.5–15.5)

## 2011-12-24 LAB — URINE CULTURE

## 2011-12-24 MED ORDER — SODIUM CHLORIDE 0.9 % IJ SOLN
10.0000 mL | INTRAMUSCULAR | Status: DC | PRN
  Administered 2011-12-25 – 2011-12-27 (×3): 10 mL

## 2011-12-24 NOTE — Progress Notes (Signed)
Peripherally Inserted Central Catheter/Midline Placement  The IV Nurse has discussed with the patient and/or persons authorized to consent for the patient, the purpose of this procedure and the potential benefits and risks involved with this procedure.  The benefits include less needle sticks, lab draws from the catheter and patient may be discharged home with the catheter.  Risks include, but not limited to, infection, bleeding, blood clot (thrombus formation), and puncture of an artery; nerve damage and irregular heat beat.  Alternatives to this procedure were also discussed.  PICC/Midline Placement Documentation    Explained with daughter and patient with interpreter.    Franne Grip Renee 12/24/2011, 4:31 PM

## 2011-12-24 NOTE — Progress Notes (Signed)
Physical Therapy Treatment Patient Details Name: Joyce Gilmore MRN: [id number] DOB: 1934-07-03 Today's Date: 12/24/2011 Time: 1478-2956 PT Time Calculation (min): 35 min  PT Assessment / Plan / Recommendation Comments on Treatment Session  Progressing well with mobility, especially with incr amb distance and activity tol; Stairs next session (they already had stair training at [facility] on 10/14 from last session, so this should be a review)    Follow Up Recommendations  Home health PT     Does the patient have the potential to tolerate intense rehabilitation     Barriers to Discharge        Equipment Recommendations  None recommended by PT    Recommendations for Other Services    Frequency 7X/week   Plan Discharge plan remains appropriate    Precautions / Restrictions Precautions Precautions: Anterior Hip;Fall Precaution Comments: Daughter assisted this therapist in writing Anterior Hip Prec in Spanish on board in room; Pt unable to  recall precautions, but daughter able t verbalize and demonstrate prec correctly; She will be the pt's primary help at home Restrictions Weight Bearing Restrictions: No Other Position/Activity Restrictions: WBAT   Pertinent Vitals/Pain 3/10 Right hip end of session    Mobility  Bed Mobility Bed Mobility: Supine to Sit Supine to Sit: 4: Min guard;With rails Sitting - Scoot to Edge of Bed: 5: Supervision Details for Bed Mobility Assistance: Occasional cues for prec, but keeps LEs together and is moving quite well Transfers Transfers: Sit to Stand;Stand to Sit Sit to Stand: 4: Min guard;From bed Stand to Sit: 4: Min guard;To toilet Details for Transfer Assistance: Continuing progress; Cues for hand placement and to use grab bar when sitting to toilet Ambulation/Gait Ambulation/Gait Assistance: 5: Supervision Ambulation Distance (Feet): 140 Feet Assistive device: Rolling walker Ambulation/Gait Assistance Details: Overall moving well;  Verbal and demo cues for Ant hip prec, especially to keep from toe-ing out, and no step through Gait Pattern: Step-to pattern Gait velocity: decreased General Gait Details: Pt tends to "toe out" with gait.     Exercises Total Joint Exercises Ankle Circles/Pumps: AROM;Both;20 reps Quad Sets: AROM;Right;10 reps Gluteal Sets: AROM;Both;10 reps Towel Squeeze: AROM;Both;10 reps Heel Slides: AROM;Right;10 reps   PT Diagnosis:    PT Problem List:   PT Treatment Interventions:     PT Goals Acute Rehab PT Goals Time For Goal Achievement: 12/30/11 Potential to Achieve Goals: Good Pt will go Supine/Side to Sit: with supervision PT Goal: Supine/Side to Sit - Progress: Progressing toward goal Pt will go Sit to Stand: with supervision PT Goal: Sit to Stand - Progress: Progressing toward goal Pt will go Stand to Sit: with supervision PT Goal: Stand to Sit - Progress: Progressing toward goal Pt will Ambulate: 51 - 150 feet;with supervision;with least restrictive assistive device PT Goal: Ambulate - Progress: Partly met  Visit Information  Last PT Received On: 12/24/11 Assistance Needed: +1    Subjective Data  Subjective: Agreeable to OOB Patient Stated Goal: Pt and daughter would like to dc home   Cognition  Overall Cognitive Status: Appears within functional limits for tasks assessed/performed Difficult to assess due to: Non-English speaking Arousal/Alertness: Awake/alert Orientation Level: Appears intact for tasks assessed Behavior During Session: [facility] for tasks performed    Balance     End of Session PT - End of Session Activity Tolerance: Patient tolerated treatment well Patient left: in chair;with call bell/phone within reach;with family/visitor present Nurse Communication: Mobility status   GP     Van Clines Hamff 12/24/2011, 1:01 PM

## 2011-12-24 NOTE — Progress Notes (Signed)
Subjective: 2 Days Post-Op Procedure(s) (LRB): IRRIGATION AND DEBRIDEMENT HIP (Right) Patient reports pain as moderate.  Her daughter at the bedside interprets.  The PICC line has not been inserted yet.  Final cultures are also not back.  Still, the infection may have just been in the superfical tissues.  Has been up with therapy.  Objective: Vital signs in last 24 hours: Temp:  [97.5 F (36.4 C)-99.7 F (37.6 C)] 98.1 F (36.7 C) (10/30 0521) Pulse Rate:  [82-90] 84  (10/30 0521) Resp:  [16-18] 17  (10/30 0521) BP: (106-124)/(47-52) 117/49 mmHg (10/30 0521) SpO2:  [97 %-100 %] 100 % (10/30 0521)  Intake/Output from previous day: 10/29 0701 - 10/30 0700 In: 410 [I.V.:160; IV Piggyback:250] Out: 90 [Drains:90] Intake/Output this shift:     Basename 12/24/11 0530 12/23/11 0615 12/22/11 1530  HGB 9.4* 8.8* 10.2*    Basename 12/24/11 0530 12/23/11 0615  WBC PENDING 7.9  RBC 3.41* 3.30*  HCT 28.8* 27.4*  PLT 321 319    Basename 12/24/11 0530 12/23/11 0615  NA 130* 130*  K 4.8 5.1  CL 98 99  CO2 22 22  BUN 12 11  CREATININE 0.83 0.74  GLUCOSE 137* 184*  CALCIUM 9.7 8.7   No results found for this basename: LABPT:2,INR:2 in the last 72 hours  Sensation intact distally Intact pulses distally Incision: moderate drainage but this is due to a vac over her closed incision  Assessment/Plan: 2 Days Post-Op Procedure(s) (LRB): IRRIGATION AND DEBRIDEMENT HIP (Right) Up with therapy Continue ABX therapy due to Post-op infection Will obtain an Infectious Disease consult for recommendations regarding type and length of antibiotic treatment.  Esther Bradstreet Y 12/24/2011, 7:30 AM

## 2011-12-25 ENCOUNTER — Encounter (HOSPITAL_COMMUNITY): Payer: Self-pay | Admitting: Orthopaedic Surgery

## 2011-12-25 DIAGNOSIS — T8450XA Infection and inflammatory reaction due to unspecified internal joint prosthesis, initial encounter: Secondary | ICD-10-CM

## 2011-12-25 DIAGNOSIS — Y831 Surgical operation with implant of artificial internal device as the cause of abnormal reaction of the patient, or of later complication, without mention of misadventure at the time of the procedure: Secondary | ICD-10-CM

## 2011-12-25 LAB — CBC
HCT: 28.2 % — ABNORMAL LOW (ref 36.0–46.0)
MCV: 82.5 fL (ref 78.0–100.0)
RDW: 14.7 % (ref 11.5–15.5)
WBC: 7.4 10*3/uL (ref 4.0–10.5)

## 2011-12-25 LAB — BASIC METABOLIC PANEL
GFR calc Af Amer: >90 mL/min (ref >90)
GFR calc non Af Amer: 81 mL/min — ABNORMAL LOW (ref >90)
Glucose, Bld: 161 mg/dL — ABNORMAL HIGH (ref 70–99)
Potassium: 4.5 mEq/L (ref 3.5–5.1)
Sodium: 134 mEq/L — ABNORMAL LOW (ref 135–145)

## 2011-12-25 LAB — GLUCOSE, CAPILLARY
Glucose-Capillary: 178 mg/dL — ABNORMAL HIGH (ref 70–99)
Glucose-Capillary: 73 mg/dL (ref 70–99)

## 2011-12-25 LAB — CULTURE, ROUTINE-ABSCESS

## 2011-12-25 MED ORDER — RIFAMPIN 300 MG PO CAPS
300.0000 mg | ORAL_CAPSULE | Freq: Two times a day (BID) | ORAL | Status: DC
  Administered 2011-12-25 – 2011-12-29 (×8): 300 mg via ORAL
  Filled 2011-12-25 (×9): qty 1

## 2011-12-25 NOTE — Progress Notes (Signed)
Physical Therapy Treatment Patient Details Name: Joyce Gilmore MRN: [id number] DOB: Oct 07, 1934 Today's Date: 12/25/2011 Time: 7829-5621 PT Time Calculation (min): 26 min  PT Assessment / Plan / Recommendation Comments on Treatment Session  Progressing well. NOted that dressing was saturated and RN aware and reenforced. Patient needs to review steps next session    Follow Up Recommendations  Home health PT     Does the patient have the potential to tolerate intense rehabilitation     Barriers to Discharge        Equipment Recommendations  None recommended by PT    Recommendations for Other Services    Frequency 7X/week   Plan Discharge plan remains appropriate;Frequency remains appropriate    Precautions / Restrictions Precautions Precautions: Anterior Hip Restrictions Weight Bearing Restrictions: No   Pertinent Vitals/Pain     Mobility  Bed Mobility Bed Mobility: Not assessed Transfers Sit to Stand: 5: Supervision;From chair/3-in-1;With upper extremity assist;With armrests;From toilet Stand to Sit: 5: Supervision;To chair/3-in-1;With upper extremity assist;With armrests;To toilet Details for Transfer Assistance: supervision for safety Ambulation/Gait Ambulation/Gait Assistance: 5: Supervision Ambulation Distance (Feet): 200 Feet Assistive device: Rolling walker Ambulation/Gait Assistance Details: Patient ambulating well. Supervision for safety.  Gait Pattern: Step-to pattern    Exercises Total Joint Exercises Heel Slides: Strengthening;Right;10 reps;AROM Long Arc Quad: AROM;Strengthening;Right;10 reps Marching in Standing: AROM;Strengthening;Both;10 reps   PT Diagnosis:    PT Problem List:   PT Treatment Interventions:     PT Goals Acute Rehab PT Goals PT Goal: Sit to Stand - Progress: Progressing toward goal PT Goal: Stand to Sit - Progress: Progressing toward goal PT Goal: Ambulate - Progress: Progressing toward goal  Visit Information  Last PT Received On: 12/25/11 Assistance Needed: +1    Subjective Data      Cognition  Overall Cognitive Status: Appears within functional limits for tasks assessed/performed Difficult to assess due to: Non-English speaking Arousal/Alertness: Awake/alert Orientation Level: Appears intact for tasks assessed Behavior During Session: [facility] for tasks performed    Balance     End of Session PT - End of Session Equipment Utilized During Treatment: Gait belt Activity Tolerance: Patient tolerated treatment well Patient left: in chair;with call bell/phone within reach;with family/visitor present Nurse Communication: Mobility status   GP     Fredrich Birks 12/25/2011, 9:58 AM 12/25/2011 Fredrich Birks PTA [phone] pager [phone] office

## 2011-12-25 NOTE — Consult Note (Signed)
[facility]     Reason for Consult: deep hip infection with prosthetic joint    Referring Physician: Dr. Magnus Ivan  Principal Problem:  *Infection of right prosthetic hip joint      . aspirin EC  325 mg Oral Daily  . calcium-vitamin D  1 tablet Oral Daily  . docusate sodium  100 mg Oral BID  . ferrous sulfate  325 mg Oral TID PC  . glipiZIDE  5 mg Oral QAC breakfast  . insulin aspart  0-15 Units Subcutaneous TID WC  . insulin aspart  0-5 Units Subcutaneous QHS  . linagliptin  5 mg Oral Daily  . lisinopril  40 mg Oral Daily  . loratadine  10 mg Oral Daily  . metFORMIN  1,000 mg Oral BID WC  . mupirocin ointment   Nasal BID  . simvastatin  20 mg Oral q1800  . vancomycin  1,250 mg Intravenous Q24H    Recommendations: Vancomycin and rifampin for 6 weeks  Dr. Drue Second to follow up tomorrow Will need weekly CBC with diff, vanco trough weekly with trough goal of 15-20, CMP weekly.   Assessment: Prosthetic joint infection  Antibiotics: Vancomycin/rifampin 10/31 -   HPI: Joyce Gilmore is a 77 y.o. female from [place] with a history of right hip replacement in [place] following a fall 3 years ago who initially presented 10/11 with worsening pain in hip and more difficulty ambulating.  She then underwent revision to a total hip and it was noted to be loose with no signs of infection.  She then developed redness and drainage at the incision site  For several days and was taken to OR for evaluation and debridement.  Dr. Magnus Ivan noted a seroma and cellulitis and took appropriate cultures that grew MRSA, including in the deep culture.  Antibiotic beads were placed.  Called for deep infection, retention of newly placed hardware.  ESR 40, CRP 7.5.   Review of Systems: Pertinent items are noted in HPI.  Past Medical History  Diagnosis Date  . Hypertension   . Depression     hx of   . Diabetes mellitus without complication   . Arthritis   .  Chronic kidney disease     hx of kidney stones,     History  Substance Use Topics  . Smoking status: Never Smoker   . Smokeless tobacco: Never Used  . Alcohol Use: No    History reviewed. No pertinent family history. No Known Allergies  OBJECTIVE: Blood pressure 123/81, pulse 84, temperature 98.2 F (36.8 C), temperature source Oral, resp. rate 20, height 4\' 10"  (1.473 m), weight 150 lb (68.04 kg), SpO2 98.00%. General: Awake, alert, nad Skin: incision noted, some mild erythema around incision Lungs: CTA B Cor: RRR without m/r/g Abdomen: soft, ntnd, +bs  Microbiology: Recent Results (from the past 240 hour(s))  URINE CULTURE     Status: Normal   Collection Time   12/22/11  3:12 PM      Component Value Range Status Comment   Specimen Description URINE, RANDOM   Final    Special Requests NONE   Final    Culture  Setup Time 12/22/2011 17:28   Final    Colony Count >=100,000 COLONIES/ML   Final    Culture ESCHERICHIA COLI   Final    Report Status 12/24/2011 FINAL   Final    Organism ID, Bacteria ESCHERICHIA COLI   Final   SURGICAL PCR SCREEN     Status:  Normal   Collection Time   12/22/11  4:25 PM      Component Value Range Status Comment   MRSA, PCR NEGATIVE  NEGATIVE Final    Staphylococcus aureus NEGATIVE  NEGATIVE Final   CULTURE, ROUTINE-ABSCESS     Status: Normal   Collection Time   12/22/11  6:03 PM      Component Value Range Status Comment   Specimen Description ABSCESS HIP RIGHT   Final    Special Requests SUPERFICIAL   Final    Gram Stain     Final    Value: FEW WBC PRESENT,BOTH PMN AND MONONUCLEAR     RARE GRAM POSITIVE COCCI     IN PAIRS Performed at [facility]   Culture     Final    Value: MODERATE METHICILLIN RESISTANT STAPHYLOCOCCUS AUREUS     Note: RIFAMPIN AND GENTAMICIN SHOULD NOT BE USED AS SINGLE DRUGS FOR TREATMENT OF STAPH INFECTIONS. CRITICAL RESULT CALLED TO, READ BACK BY AND VERIFIED WITH: CAROL S@1018  ON [id number] BY [facility]   Report  Status 12/25/2011 FINAL   Final    Organism ID, Bacteria METHICILLIN RESISTANT STAPHYLOCOCCUS AUREUS   Final   ANAEROBIC CULTURE     Status: Normal (Preliminary result)   Collection Time   12/22/11  6:03 PM      Component Value Range Status Comment   Specimen Description ABSCESS HIP RIGHT   Final    Special Requests SUPERFICIAL   Final    Gram Stain PENDING   Incomplete    Culture     Final    Value: NO ANAEROBES ISOLATED; CULTURE IN PROGRESS FOR 5 DAYS   Report Status PENDING   Incomplete   GRAM STAIN     Status: Normal   Collection Time   12/22/11  6:03 PM      Component Value Range Status Comment   Specimen Description ABSCESS HIP RIGHT   Final    Special Requests SUPERFICIAL   Final    Gram Stain     Final    Value: FEW WBC PRESENT,BOTH PMN AND MONONUCLEAR     RARE GRAM POSITIVE COCCI IN PAIRS   Report Status 12/22/2011 FINAL   Final   CULTURE, ROUTINE-ABSCESS     Status: Normal   Collection Time   12/22/11  6:20 PM      Component Value Range Status Comment   Specimen Description ABSCESS HIP RIGHT   Final    Special Requests DEEP   Final    Gram Stain     Final    Value: RARE WBC PRESENT,BOTH PMN AND MONONUCLEAR     NO ORGANISMS SEEN     Performed at [facility]   Culture     Final    Value: MODERATE METHICILLIN RESISTANT STAPHYLOCOCCUS AUREUS     Note: RIFAMPIN AND GENTAMICIN SHOULD NOT BE USED AS SINGLE DRUGS FOR TREATMENT OF STAPH INFECTIONS. CRITICAL RESULT CALLED TO, READ BACK BY AND VERIFIED WITH: CAROL S@1018  ON [id number] BY [facility]   Report Status 12/25/2011 FINAL   Final    Organism ID, Bacteria METHICILLIN RESISTANT STAPHYLOCOCCUS AUREUS   Final   ANAEROBIC CULTURE     Status: Normal (Preliminary result)   Collection Time   12/22/11  6:20 PM      Component Value Range Status Comment   Specimen Description ABSCESS HIP RIGHT   Final    Special Requests DEEP   Final    Gram Stain PENDING   Incomplete  Culture     Final    Value: NO ANAEROBES ISOLATED;  CULTURE IN PROGRESS FOR 5 DAYS   Report Status PENDING   Incomplete   GRAM STAIN     Status: Normal   Collection Time   12/22/11  6:20 PM      Component Value Range Status Comment   Specimen Description ABSCESS HIP RIGHT   Final    Special Requests DEEP   Final    Gram Stain     Final    Value: RARE WBC PRESENT,BOTH PMN AND MONONUCLEAR     NO ORGANISMS SEEN   Report Status 12/22/2011 FINAL   Final     Staci Righter, MD [facility] [phone] pager  [phone] cell 12/25/2011, 4:35 PM

## 2011-12-25 NOTE — Progress Notes (Signed)
Subjective: 3 Days Post-Op Procedure(s) (LRB): IRRIGATION AND DEBRIDEMENT HIP (Right) Patient reports pain as moderate.    Objective: Vital signs in last 24 hours: Temp:  [98.2 F (36.8 C)-98.5 F (36.9 C)] 98.4 F (36.9 C) (10/31 0629) Pulse Rate:  [92-97] 97  (10/31 0629) Resp:  [17-18] 18  (10/31 0629) BP: (101-132)/(50-82) 132/50 mmHg (10/31 0629) SpO2:  [95 %-96 %] 96 % (10/31 0629)  Intake/Output from previous day: 10/30 0701 - 10/31 0700 In: 880 [P.O.:180; I.V.:700] Out: -  Intake/Output this shift:     Basename 12/24/11 0530 12/23/11 0615 12/22/11 1530  HGB 9.4* 8.8* 10.2*    Basename 12/24/11 0530 12/23/11 0615  WBC 8.9 7.9  RBC 3.41* 3.30*  HCT 28.8* 27.4*  PLT 321 319    Basename 12/24/11 0530 12/23/11 0615  NA 130* 130*  K 4.8 5.1  CL 98 99  CO2 22 22  BUN 12 11  CREATININE 0.83 0.74  GLUCOSE 137* 184*  CALCIUM 9.7 8.7   No results found for this basename: LABPT:2,INR:2 in the last 72 hours  Sensation intact distally Intact pulses distally Dorsiflexion/Plantar flexion intact  Assessment/Plan: 3 Days Post-Op Procedure(s) (LRB): IRRIGATION AND DEBRIDEMENT HIP (Right) Continue ABX therapy due to Post-op infection Will assess incision and remove VAC this afternoon. Will obtain ID consult for type and duration of antibiotics (likely 6 weeks due to culture results).  Teyon Odette Y 12/25/2011, 7:11 AM

## 2011-12-25 NOTE — Progress Notes (Signed)
Patient ID: Joyce Gilmore, female   DOB: [birth year], 77 y.o.   MRN: [id number] I came by this afternoon to remove the VAC from her incision.  Cultures did grow MRSA.  The VAC was removed and she shows considerable drainage from her hip wound/incision.  ID is here for recommendations.  I will likely need to take her back to the OR Saturday for a repeat I&D on the right hip.  I will reassess in the am.

## 2011-12-25 NOTE — Progress Notes (Signed)
Received call from lab stating that wound culture is positive for MRSA.  Message left with MD. Patient and daughter informed and placed on Contact isolation.

## 2011-12-25 NOTE — Progress Notes (Signed)
ANTIBIOTIC CONSULT NOTE - FOLLOW UP  Pharmacy Consult for Vancomycin Indication: Infection following rt hip revision arthroplasty  No Known Allergies  Patient Measurements: Height: 4\' 10"  (147.3 cm) Weight: 150 lb (68.04 kg) IBW/kg (Calculated) : 40.9   Vital Signs: Temp: 98.2 F (36.8 C) (10/31 1332) BP: 123/81 mmHg (10/31 1332) Pulse Rate: 84  (10/31 1332) Intake/Output from previous day: 10/30 0701 - 10/31 0700 In: 880 [P.O.:180; I.V.:700] Out: -  Intake/Output from this shift:    Labs:  Basename 12/25/11 0546 12/24/11 0530 12/23/11 0615 12/22/11 1530  WBC -- 8.9 7.9 9.4  HGB -- 9.4* 8.8* 10.2*  PLT -- 321 319 363  LABCREA -- -- -- --  CREATININE 0.74 0.83 0.74 --   Estimated Creatinine Clearance: 49.6 ml/min (by C-G formula based on Cr of 0.74). No results found for this basename: VANCOTROUGH:2,VANCOPEAK:2,VANCORANDOM:2,GENTTROUGH:2,GENTPEAK:2,GENTRANDOM:2,TOBRATROUGH:2,TOBRAPEAK:2,TOBRARND:2,AMIKACINPEAK:2,AMIKACINTROU:2,AMIKACIN:2, in the last 72 hours   Assessment:Vanc Rx#4 (10/29>>) for hip infxn s/p arthroplasty 2 wks ago, likely need 3-6 wks abx afebrile, WBC WNL, CRP elevated. Est pk: 36, est trough:12. SCr remains stable.  10/28 urine cx - >100K E.coli 10/28 right hip abscess (deep) - MRSA 10/28 right hip abscess (superficial) - MRSA  Goal of Therapy:  Vancomycin trough level 15-20 mcg/ml  Plan:  - Continue Vancomycin at current dose - Consider treating EColi UTI if patient symptomatic - Patient will receive dose #3 of Vancomycin tonight, will plan to check trough tomorrow at 1730 - Will monitor cx/spec/sens, renal fn and clinical status daily.  Thanks, Sofie Schendel K. Allena Katz, PharmD, BCPS.  Clinical Pharmacist Pager [phone]. 12/25/2011 3:00 PM

## 2011-12-26 LAB — VANCOMYCIN, TROUGH: Vancomycin Tr: 14.3 ug/mL (ref 10.0–20.0)

## 2011-12-26 LAB — GLUCOSE, CAPILLARY: Glucose-Capillary: 136 mg/dL — ABNORMAL HIGH (ref 70–99)

## 2011-12-26 MED ORDER — VANCOMYCIN HCL 1000 MG IV SOLR
1500.0000 mg | INTRAVENOUS | Status: DC
  Administered 2011-12-27 – 2011-12-29 (×2): 1500 mg via INTRAVENOUS
  Filled 2011-12-26 (×4): qty 1500

## 2011-12-26 NOTE — Progress Notes (Signed)
[facility]    Date of Admission:  12/22/2011   Total days of antibiotics 5        Day 5 vanco        Day 2 rifampin           ID: Joyce Gilmore is a 77 y.o. female  with a history of right hip replacement in [place] following a fall 3 years ago presented 10/11 with worsening pain in hip and more difficulty ambulating. s/p revision to a total hip and it was noted to be loose with no signs of infection. She then developed redness and drainage at the incision site For several days and was taken to OR for evaluation and debridement. Dr. Magnus Ivan noted a seroma and cellulitis and took appropriate cultures that grew MRSA, including in the deep culture. Antibiotic beads were placed. Called for deep infection, retention of newly placed hardware. ESR 40, CRP 7.5.  Principal Problem:  *Infection of right prosthetic hip joint    Subjective: Afebrile, no complaints  Medications:     . aspirin EC  325 mg Oral Daily  . calcium-vitamin D  1 tablet Oral Daily  . docusate sodium  100 mg Oral BID  . ferrous sulfate  325 mg Oral TID PC  . glipiZIDE  5 mg Oral QAC breakfast  . insulin aspart  0-15 Units Subcutaneous TID WC  . insulin aspart  0-5 Units Subcutaneous QHS  . linagliptin  5 mg Oral Daily  . lisinopril  40 mg Oral Daily  . loratadine  10 mg Oral Daily  . metFORMIN  1,000 mg Oral BID WC  . mupirocin ointment   Nasal BID  . rifampin  300 mg Oral Q12H  . simvastatin  20 mg Oral q1800  . vancomycin  1,250 mg Intravenous Q24H    Objective: Vital signs in last 24 hours: Temp:  [97.9 F (36.6 C)-98.2 F (36.8 C)] 97.9 F (36.6 C) (11/01 0557) Pulse Rate:  [82-86] 82  (11/01 0557) Resp:  [18] 18  (11/01 0557) BP: (127-142)/(58-60) 127/60 mmHg (11/01 0557) SpO2:  [96 %-99 %] 99 % (11/01 0557) General: Awake, alert, nad  Skin: incision noted, some mild erythema around incision  Lungs: CTA B  Cor: RRR without m/r/g  Abdomen: soft, ntnd,  +bs   Lab Results  Basename 12/25/11 1709 12/25/11 0546 12/24/11 0530  WBC 7.4 -- 8.9  HGB 9.3* -- 9.4*  HCT 28.2* -- 28.8*  NA -- 134* 130*  K -- 4.5 4.8  CL -- 102 98  CO2 -- 23 22  BUN -- 11 12  CREATININE -- 0.74 0.83  GLU -- -- --    Microbiology: 10/28 deep tissue culture: MRSA ( R clinda, R erythro, Intermed levo, R oxa, S Rif, R tetra, S tmp/smx, S vanco 1) 10/28 urine: ecoli ( ctx <1, cipro R, cefazolin 8, gent R, nitro furantoin < 16, bactrim R)  Studies/Results: No results found. Lab Results  Component Value Date   CRP 7.5* 12/23/2011   Lab Results  Component Value Date   ESRSEDRATE 40* 12/23/2011     Assessment/Plan: MRSA Prosthetic joint infection with retained hardware = continue with vancomycin and rifampin for 6 wks. Please get weekly cbc, cmp, and vanco trough (goal of 15-20)  - will set up appt in ID clinic for follow up in 5- 6wks, to re-evaluate and oral continuation with bactrim.  Bacturia= unclear if she was symptomatic, will not treat at  this time. If she does become symptomatic, would recommend to treat nitrofurantoin x 3 days.   Drue Second [facility] for Infectious Diseases Cell: [phone] Pager: [phone]  12/26/2011, 1:36 PM

## 2011-12-26 NOTE — Progress Notes (Signed)
Physical Therapy Treatment Patient Details Name: Joyce Gilmore MRN: [id number] DOB: [birth year] Today's Date: 12/26/2011 Time: 4098-1191 PT Time Calculation (min): 24 min  PT Assessment / Plan / Recommendation Comments on Treatment Session  Pt continues to improve with mobility.     Follow Up Recommendations  Home health PT     Does the patient have the potential to tolerate intense rehabilitation     Barriers to Discharge        Equipment Recommendations  None recommended by PT    Recommendations for Other Services    Frequency 7X/week   Plan Discharge plan remains appropriate;Frequency remains appropriate    Precautions / Restrictions Precautions Precautions: Anterior Hip Restrictions Weight Bearing Restrictions: No Other Position/Activity Restrictions: WBAT   Pertinent Vitals/Pain Pt reporting pain 4/10 in R hip. RN notified.     Mobility  Bed Mobility Bed Mobility: Supine to Sit;Sit to Supine Supine to Sit: 6: Modified independent (Device/Increase time);HOB flat Sit to Supine: 6: Modified independent (Device/Increase time);HOB flat Details for Bed Mobility Assistance: pt practiced transferring to/from each side of the bed to determine most appropriate technique for pt.  Pt reports less painful and  "easier" to transfer to Left side of bed.   Transfers Transfers: Sit to Stand;Stand to Sit Sit to Stand: 6: Modified independent (Device/Increase time);From bed;With upper extremity assist Stand to Sit: 6: Modified independent (Device/Increase time);To bed;With upper extremity assist Ambulation/Gait Ambulation/Gait Assistance: 6: Modified independent (Device/Increase time) Ambulation Distance (Feet): 200 Feet Assistive device: Rolling walker Ambulation/Gait Assistance Details: No instruction required.  Gait Pattern: Step-to pattern Gait velocity: decreased Stairs: Yes Stairs Assistance: 4: Min guard Stairs Assistance Details (indicate cue type and  reason): assist to manage walker.  Stair Management Technique: One rail Left;With walker;Step to pattern;Forwards Number of Stairs: 3  Wheelchair Mobility Wheelchair Mobility: No    Exercises     PT Diagnosis:    PT Problem List:   PT Treatment Interventions:     PT Goals Acute Rehab PT Goals PT Goal Formulation: With patient/family Time For Goal Achievement: 12/30/11 Potential to Achieve Goals: Good Pt will go Supine/Side to Sit: with supervision PT Goal: Supine/Side to Sit - Progress: Met Pt will go Sit to Supine/Side: with supervision PT Goal: Sit to Supine/Side - Progress: Met Pt will go Sit to Stand: with supervision PT Goal: Sit to Stand - Progress: Met Pt will go Stand to Sit: with supervision PT Goal: Stand to Sit - Progress: Met Pt will Ambulate: 51 - 150 feet;with supervision;with least restrictive assistive device PT Goal: Ambulate - Progress: Met Pt will Go Up / Down Stairs: 3-5 stairs;with min assist;with rail(s);with least restrictive assistive device PT Goal: Up/Down Stairs - Progress: Met  Visit Information  Last PT Received On: 12/26/11    Subjective Data  Subjective: Agreeable to OOB Patient Stated Goal: Pt and daughter would like to dc home   Cognition  Overall Cognitive Status: Appears within functional limits for tasks assessed/performed Difficult to assess due to: Non-English speaking Arousal/Alertness: Awake/alert Orientation Level: Appears intact for tasks assessed Behavior During Session: [employer] for tasks performed    Balance  Balance Balance Assessed: No  End of Session PT - End of Session Equipment Utilized During Treatment: Gait belt Activity Tolerance: Patient tolerated treatment well Patient left: in bed;with call bell/phone within reach;with family/visitor present Nurse Communication: Mobility status   GP     Andry Bogden 12/26/2011, 5:50 PM Donavon Kimrey L. Jorja Empie DPT [phone]

## 2011-12-26 NOTE — Progress Notes (Signed)
Patient ID: Joyce Gilmore, female   DOB: 01/16/1935, 77 y.o.   MRN: [id number] I appreciate the ID docs consult.  Will need 6 weeks IV antibiotics.  The wound is draining considerably, but some of this is due to breakdown of the antibiotic beads within the wound.  I may put her back on the OR schedule tomorrow for a repeat I&D.  Will re-assess the wound this evening.  Will hold on discharge until Monday.

## 2011-12-26 NOTE — Progress Notes (Signed)
ANTIBIOTIC CONSULT NOTE - FOLLOW UP  Pharmacy Consult for Vancomycin Indication: Infection following rt hip revision arthroplasty  No Known Allergies  Labs:  Basename 12/25/11 1709 12/25/11 0546 12/24/11 0530  WBC 7.4 -- 8.9  HGB 9.3* -- 9.4*  PLT 269 -- 321  LABCREA -- -- --  CREATININE -- 0.74 0.83   Estimated Creatinine Clearance: 49.6 ml/min (by C-G formula based on Cr of 0.74).  Basename 12/26/11 1810  VANCOTROUGH 14.3  VANCOPEAK --  VANCORANDOM --  GENTTROUGH --  GENTPEAK --  GENTRANDOM --  TOBRATROUGH --  TOBRAPEAK --  TOBRARND --  AMIKACINPEAK --  AMIKACINTROU --  AMIKACIN --     Assessment:Vanc Rx#4 (10/29>>) for hip infxn s/p arthroplasty 2 wks ago, likely need 3-6 wks abx afebrile, WBC WNL, CRP elevated.  SCr remains stable.  Vancomycin trough level = 14.3 mcg/dL  91/47 urine cx - >829F E.coli 10/28 right hip abscess (deep) - MRSA 10/28 right hip abscess (superficial) - MRSA  Goal of Therapy:  Vancomycin trough level 15-20 mcg/ml  Plan:  - Increase Vancomycin to 1500 mg IV Q 24 hours - Will monitor cx/spec/sens, renal fn and clinical status daily.  Thank you Okey Regal, PharmD [phone]  12/26/2011 7:08 PM

## 2011-12-26 NOTE — Progress Notes (Signed)
Patient ID: Joyce Gilmore, female   DOB: Mar 13, 1934, 77 y.o.   MRN: [id number] With continued drainage from her right hip wound and the incision still looking slightly angry, I plan on returning to the OR tomorrow for a repeat irrigation and debridement of her right hip. I explained this to her and her family and they agree to proceed to the OR tomorrow 11/2,

## 2011-12-27 ENCOUNTER — Encounter (HOSPITAL_COMMUNITY)
Admission: AD | Disposition: A | Discharge status: Home Health | Payer: Self-pay | Source: Ambulatory Visit | Attending: Orthopaedic Surgery

## 2011-12-27 ENCOUNTER — Encounter (HOSPITAL_COMMUNITY): Payer: Self-pay | Admitting: *Deleted

## 2011-12-27 ENCOUNTER — Inpatient Hospital Stay (HOSPITAL_COMMUNITY): Payer: Medicaid Other | Admitting: *Deleted

## 2011-12-27 HISTORY — PX: INCISION AND DRAINAGE HIP: SHX1801

## 2011-12-27 LAB — CBC
MCH: 26.9 pg (ref 26.0–34.0)
MCV: 81.9 fL (ref 78.0–100.0)
Platelets: 276 10*3/uL (ref 150–400)
RDW: 14.7 % (ref 11.5–15.5)
WBC: 6.9 10*3/uL (ref 4.0–10.5)

## 2011-12-27 LAB — ANAEROBIC CULTURE

## 2011-12-27 LAB — GLUCOSE, CAPILLARY
Glucose-Capillary: 184 mg/dL — ABNORMAL HIGH (ref 70–99)
Glucose-Capillary: 144 mg/dL — ABNORMAL HIGH (ref 70–99)

## 2011-12-27 LAB — COMPREHENSIVE METABOLIC PANEL
AST: 19 U/L (ref 0–37)
Albumin: 2.1 g/dL — ABNORMAL LOW (ref 3.5–5.2)
Alkaline Phosphatase: 73 U/L (ref 39–117)
BUN: 9 mg/dL (ref 6–23)
CO2: 20 mEq/L (ref 19–32)
Chloride: 105 mEq/L (ref 96–112)
GFR calc non Af Amer: 85 mL/min — ABNORMAL LOW (ref >90)
Potassium: 3.9 mEq/L (ref 3.5–5.1)
Total Bilirubin: 0.3 mg/dL (ref 0.3–1.2)

## 2011-12-27 SURGERY — IRRIGATION AND DEBRIDEMENT HIP
Anesthesia: General | Site: Hip | Laterality: Right | Wound class: Dirty or Infected

## 2011-12-27 MED ORDER — FENTANYL CITRATE 0.05 MG/ML IJ SOLN
INTRAMUSCULAR | Status: DC | PRN
  Administered 2011-12-27 (×3): 50 ug via INTRAVENOUS

## 2011-12-27 MED ORDER — ONDANSETRON HCL 4 MG/2ML IJ SOLN: 4.0000 mg | Freq: Once | INTRAMUSCULAR | Status: DC | PRN

## 2011-12-27 MED ORDER — OXYCODONE HCL 5 MG PO TABS: 5.0000 mg | ORAL_TABLET | Freq: Once | ORAL | Status: DC | PRN

## 2011-12-27 MED ORDER — LACTATED RINGERS IV SOLN
INTRAVENOUS | Status: DC | PRN
  Administered 2011-12-27: 08:00:00 via INTRAVENOUS

## 2011-12-27 MED ORDER — MIDAZOLAM HCL 5 MG/5ML IJ SOLN
INTRAMUSCULAR | Status: DC | PRN
  Administered 2011-12-27: 1 mg via INTRAVENOUS

## 2011-12-27 MED ORDER — HYDROMORPHONE HCL PF 1 MG/ML IJ SOLN
0.2500 mg | INTRAMUSCULAR | Status: DC | PRN
  Administered 2011-12-27 (×2): 0.5 mg via INTRAVENOUS

## 2011-12-27 MED ORDER — LIDOCAINE HCL (CARDIAC) 20 MG/ML IV SOLN
INTRAVENOUS | Status: DC | PRN
  Administered 2011-12-27: 100 mg via INTRAVENOUS

## 2011-12-27 MED ORDER — OXYCODONE HCL 5 MG/5ML PO SOLN: 5.0000 mg | Freq: Once | ORAL | Status: DC | PRN

## 2011-12-27 MED ORDER — CLINDAMYCIN PHOSPHATE 600 MG/50ML IV SOLN
INTRAVENOUS | Status: DC | PRN
  Administered 2011-12-27: 600 mg via INTRAVENOUS

## 2011-12-27 MED ORDER — PROPOFOL 10 MG/ML IV BOLUS
INTRAVENOUS | Status: DC | PRN
  Administered 2011-12-27: 100 mg via INTRAVENOUS

## 2011-12-27 MED ORDER — MEPERIDINE HCL 25 MG/ML IJ SOLN: 6.2500 mg | INTRAMUSCULAR | Status: DC | PRN

## 2011-12-27 MED ORDER — NEOSTIGMINE METHYLSULFATE 1 MG/ML IJ SOLN
INTRAMUSCULAR | Status: DC | PRN
  Administered 2011-12-27: 4 mg via INTRAVENOUS

## 2011-12-27 MED ORDER — ROCURONIUM BROMIDE 100 MG/10ML IV SOLN
INTRAVENOUS | Status: DC | PRN
  Administered 2011-12-27: 50 mg via INTRAVENOUS

## 2011-12-27 MED ORDER — GLYCOPYRROLATE 0.2 MG/ML IJ SOLN
INTRAMUSCULAR | Status: DC | PRN
  Administered 2011-12-27: .5 mg via INTRAVENOUS

## 2011-12-27 MED ORDER — SODIUM CHLORIDE 0.9 % IR SOLN
Status: DC | PRN
  Administered 2011-12-27: 6000 mL

## 2011-12-27 SURGICAL SUPPLY — 43 items
BAG DECANTER FOR FLEXI CONT (MISCELLANEOUS) IMPLANT
CLOTH BEACON ORANGE TIMEOUT ST (SAFETY) ×2 IMPLANT
COVER SURGICAL LIGHT HANDLE (MISCELLANEOUS) ×2 IMPLANT
DRAPE ORTHO SPLIT 77X108 STRL (DRAPES) ×2
DRAPE SURG ORHT 6 SPLT 77X108 (DRAPES) ×2 IMPLANT
DRAPE U-SHAPE 47X51 STRL (DRAPES) ×2 IMPLANT
DRSG ADAPTIC 3X8 NADH LF (GAUZE/BANDAGES/DRESSINGS) ×2 IMPLANT
DRSG MEPILEX BORDER 4X8 (GAUZE/BANDAGES/DRESSINGS) ×4 IMPLANT
DRSG PAD ABDOMINAL 8X10 ST (GAUZE/BANDAGES/DRESSINGS) ×2 IMPLANT
DURAPREP 26ML APPLICATOR (WOUND CARE) IMPLANT
ELECT CAUTERY BLADE 6.4 (BLADE) IMPLANT
ELECT REM PT RETURN 9FT ADLT (ELECTROSURGICAL)
ELECTRODE REM PT RTRN 9FT ADLT (ELECTROSURGICAL) IMPLANT
GLOVE BIOGEL PI IND STRL 8 (GLOVE) ×1 IMPLANT
GLOVE BIOGEL PI INDICATOR 8 (GLOVE) ×1
GLOVE ORTHO TXT STRL SZ7.5 (GLOVE) ×2 IMPLANT
GOWN PREVENTION PLUS LG XLONG (DISPOSABLE) IMPLANT
GOWN PREVENTION PLUS XLARGE (GOWN DISPOSABLE) ×2 IMPLANT
GOWN STRL NON-REIN LRG LVL3 (GOWN DISPOSABLE) ×2 IMPLANT
HANDPIECE INTERPULSE COAX TIP (DISPOSABLE) ×1
KIT BASIN OR (CUSTOM PROCEDURE TRAY) ×2 IMPLANT
KIT ROOM TURNOVER OR (KITS) ×2 IMPLANT
MANIFOLD NEPTUNE II (INSTRUMENTS) ×2 IMPLANT
NS IRRIG 1000ML POUR BTL (IV SOLUTION) ×2 IMPLANT
PACK ORTHO EXTREMITY (CUSTOM PROCEDURE TRAY) ×2 IMPLANT
PACK TOTAL JOINT (CUSTOM PROCEDURE TRAY) IMPLANT
PAD ARMBOARD 7.5X6 YLW CONV (MISCELLANEOUS) ×4 IMPLANT
SET HNDPC FAN SPRY TIP SCT (DISPOSABLE) ×1 IMPLANT
SPONGE GAUZE 4X4 12PLY (GAUZE/BANDAGES/DRESSINGS) IMPLANT
SPONGE LAP 18X18 X RAY DECT (DISPOSABLE) ×2 IMPLANT
STAPLER VISISTAT 35W (STAPLE) ×2 IMPLANT
SUT ETHILON 2 0 FS 18 (SUTURE) IMPLANT
SUT PDS AB 0 CT 36 (SUTURE) ×6 IMPLANT
SUT PDS AB 2-0 CT1 27 (SUTURE) ×6 IMPLANT
SUT VIC AB 1 CTB1 27 (SUTURE) ×2 IMPLANT
SUT VIC AB 2-0 CT1 27 (SUTURE) ×1
SUT VIC AB 2-0 CT1 TAPERPNT 27 (SUTURE) ×1 IMPLANT
SUT VICRYL 0 CT 1 36IN (SUTURE) ×2 IMPLANT
TOWEL OR 17X24 6PK STRL BLUE (TOWEL DISPOSABLE) ×2 IMPLANT
TOWEL OR 17X26 10 PK STRL BLUE (TOWEL DISPOSABLE) ×2 IMPLANT
TUBE ANAEROBIC SPECIMEN COL (MISCELLANEOUS) IMPLANT
UNDERPAD 30X30 INCONTINENT (UNDERPADS AND DIAPERS) IMPLANT
WATER STERILE IRR 1000ML POUR (IV SOLUTION) IMPLANT

## 2011-12-27 NOTE — Progress Notes (Signed)
[facility]    Date of Admission:  12/22/2011   Total days of antibiotics 5        Day 5 vanco        Day 2 rifampin           ID: Joyce Gilmore is a 77 y.o. female  with a history of right hip replacement in [place] following a fall 3 years ago presented 10/11 with worsening pain in hip and more difficulty ambulating. s/p revision to a total hip; then developed deep tissue infection/prosthetic joint infection with MRSA s/p I X D x  2 with retention of newly placed hardware. ESR 40, CRP 7.5. POD # 0  Principal Problem:  *Infection of right prosthetic hip joint  Subjective: afebrile  24hr event: had repeat I x D today  Medications:     . aspirin EC  325 mg Oral Daily  . calcium-vitamin D  1 tablet Oral Daily  . docusate sodium  100 mg Oral BID  . ferrous sulfate  325 mg Oral TID PC  . glipiZIDE  5 mg Oral QAC breakfast  . insulin aspart  0-15 Units Subcutaneous TID WC  . insulin aspart  0-5 Units Subcutaneous QHS  . linagliptin  5 mg Oral Daily  . lisinopril  40 mg Oral Daily  . loratadine  10 mg Oral Daily  . metFORMIN  1,000 mg Oral BID WC  . mupirocin ointment   Nasal BID  . rifampin  300 mg Oral Q12H  . simvastatin  20 mg Oral q1800  . vancomycin  1,500 mg Intravenous Q24H  . DISCONTD: vancomycin  1,250 mg Intravenous Q24H    Objective: Vital signs in last 24 hours: Temp:  [97.3 F (36.3 C)-98.2 F (36.8 C)] 97.3 F (36.3 C) (11/02 1334) Pulse Rate:  [67-90] 68  (11/02 1334) Resp:  [16-24] 20  (11/02 1334) BP: (92-162)/(40-80) 92/40 mmHg (11/02 1334) SpO2:  [94 %-100 %] 100 % (11/02 1334) General: Awake, alert, nad  Skin: incision noted, some mild erythema around incision  Lungs: CTA B  Cor: RRR without m/r/g  Abdomen: soft, ntnd, +bs   Lab Results  Basename 12/27/11 0607 12/25/11 1709 12/25/11 0546  WBC 6.9 7.4 --  HGB 9.4* 9.3* --  HCT 28.6* 28.2* --  NA -- -- 134*  K -- -- 4.5  CL -- -- 102  CO2 -- -- 23   BUN -- -- 11  CREATININE -- -- 0.74  GLU -- -- --    Microbiology: 10/28 deep tissue culture: MRSA ( R clinda, R erythro, Intermed levo, R oxa, S Rif, R tetra, S tmp/smx, S vanco 1) 10/28 urine: ecoli ( ctx <1, cipro R, cefazolin 8, gent R, nitro furantoin < 16, bactrim R)  Studies/Results: No results found. Lab Results  Component Value Date   CRP 7.5* 12/23/2011   Lab Results  Component Value Date   ESRSEDRATE 40* 12/23/2011     Assessment/Plan: MRSA Prosthetic joint infection with retained hardware s/p I x D x 2 = continue with vancomycin and rifampin for 6 wks.  Will check CMP today to ensure no AKI due to vancomycin as well as baseline LFTs due to starting rifampin  - will need to get weekly cbc, cmp, and vanco trough (goal of 15-20) for when she is discharged on long term antibiotics  - will set up appt in ID clinic for follow up in 5- 6wks, to re-evaluate and oral continuation  with bactrim.  Bacturia= unclear if she was symptomatic, will not treat at this time. If she does become symptomatic, would recommend to treat nitrofurantoin x 3 days.   Drue Second [facility] for Infectious Diseases Cell: [phone] Pager: [phone]  12/27/2011, 2:18 PM

## 2011-12-27 NOTE — Transfer of Care (Signed)
Immediate Anesthesia Transfer of Care Note  Patient: Joyce Gilmore  Procedure(s) Performed: Procedure(s) (LRB) with comments: IRRIGATION AND DEBRIDEMENT HIP (Right) - Repeat irrigation and debridement  Patient Location: PACU  Anesthesia Type:General  Level of Consciousness: awake and alert   Airway & Oxygen Therapy: Patient Spontanous Breathing and Patient connected to face mask oxygen  Post-op Assessment: Report given to PACU RN and Post -op Vital signs reviewed and stable  Post vital signs: Reviewed and stable  Complications: No apparent anesthesia complications

## 2011-12-27 NOTE — Brief Op Note (Signed)
12/22/2011 - 12/27/2011  9:13 AM  PATIENT:  Marlynn Perking Tonne  77 y.o. female  PRE-OPERATIVE DIAGNOSIS:  infected right hip  POST-OPERATIVE DIAGNOSIS:  same  PROCEDURE:  Procedure(s) (LRB) with comments: IRRIGATION AND DEBRIDEMENT HIP (Right) - Repeat irrigation and debridement  SURGEON:  Surgeon(s) and Role:    * Kathryne Hitch, MD - Primary  PHYSICIAN ASSISTANT:   ASSISTANTS: none   ANESTHESIA:   general  EBL:  Total I/O In: 700 [I.V.:700] Out: -   BLOOD ADMINISTERED:none  DRAINS: none   LOCAL MEDICATIONS USED:  NONE  SPECIMEN:  No Specimen  DISPOSITION OF SPECIMEN:  N/A  COUNTS:  YES  TOURNIQUET:  * No tourniquets in log *  DICTATION: .Other Dictation: Dictation Number [id number]  PLAN OF CARE: Admit to inpatient   PATIENT DISPOSITION:  PACU - hemodynamically stable.   Delay start of Pharmacological VTE agent (>24hrs) due to surgical blood loss or risk of bleeding: no

## 2011-12-27 NOTE — Op Note (Signed)
[name], [name] NO.:  [id number]  MEDICAL RECORD NO.:  [id number]  LOCATION:  6N11C                        FACILITY:  [facility]  PHYSICIAN:  Vanita Panda. 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  PROCEDURE:  Repeat irrigation and debridement of right hip.  SURGEON:  Doneen Poisson, MD  ANESTHESIA:  General.  ANTIBIOTICS:  600 mg IV clindamycin.  BLOOD LOSS:  Less than 100 mL.  COMPLICATIONS:  None.  FINDINGS:  No gross purulence in the soft tissues and minimal necrotic tissue.  SURGEON:  Vanita Panda. Magnus Ivan, MD  ANESTHESIA:  General.  COMPLICATIONS:  None.  INDICATIONS:  Ms. Joyce Gilmore is a 77 year old female, who is over 2 weeks now status post a right total hip arthroplasty.  Two weeks after surgery, she was seen in the office and was found to have a infection of her hip.  She was taken to the operating room and the irrigation and debridement was performed of the joint all the way up to soft tissues and antibiotic beads were placed.  She is on IV vancomycin.  It did grow MRSA.  She was taken back to the operating today for a repeat irrigation and debridement.  The risks and benefits of this were explained to her and her family and they do wish to proceed with surgery.  PROCEDURE DESCRIPTION:  After informed consent was obtained, appropriate right hip was marked.  She was brought to the operating room and placed supine on the operating table.  General anesthesia was then obtained. She was then turned into a lateral decubitus position with right operative hip up.  This hip was prepped and draped with Betadine scrub and  paint.  A time-out was called and she was identified as the correct patient, correct right hip.  I then opened all the hip removing all the previous sutures and did not find any gross purulence.  I found little bit of necrotic tissue and a small seroma.  I then placed irrigated 6 L of normal saline solution using pulsatile lavage throughout the wound and it appeared clean.  I did remove the superficial antibiotic beads as well.  I then removed some minimal necrotic tissue with a #10 blade.  I closed the deep tissue with 0 PDS followed by 2-0 PDS in the subcutaneous tissue and interrupted 2-0 nylon on the skin.  Adaptic and a well-padded sterile dressing was applied.  She was rolled back into supine position, awakened, extubated, and taken to recovery room in stable condition.  All final counts were correct.  There were no complications noted.     Vanita Panda. Magnus Ivan, M.D.     CYB/MEDQ  D:  12/27/2011  T:  12/27/2011  Job:  [id number]

## 2011-12-27 NOTE — Anesthesia Postprocedure Evaluation (Signed)
Anesthesia Post Note  Patient: Joyce Gilmore  Procedure(s) Performed: Procedure(s) (LRB): IRRIGATION AND DEBRIDEMENT HIP (Right)  Anesthesia type: general  Patient location: PACU  Post pain: Pain level controlled  Post assessment: Patient's Cardiovascular Status Stable  Last Vitals:  Filed Vitals:   12/27/11 0930  BP: 147/80  Pulse: 88  Temp:   Resp: 23    Post vital signs: Reviewed and stable  Level of consciousness: sedated  Complications: No apparent anesthesia complications

## 2011-12-27 NOTE — Anesthesia Preprocedure Evaluation (Addendum)
Anesthesia Evaluation  Patient identified by MRN, date of birth, ID band Patient awake    Reviewed: Allergy & Precautions, H&P , NPO status , Patient's Chart, lab work & pertinent test results, reviewed documented beta blocker date and time   Airway Mallampati: I TM Distance: >3 FB Neck ROM: Full    Dental  (+) Poor Dentition   Pulmonary          Cardiovascular hypertension, Pt. on medications     Neuro/Psych    GI/Hepatic   Endo/Other  diabetes, Type 1  Renal/GU      Musculoskeletal   Abdominal   Peds  Hematology   Anesthesia Other Findings   Reproductive/Obstetrics                          Anesthesia Physical Anesthesia Plan  ASA: III  Anesthesia Plan: General   Post-op Pain Management:    Induction: Intravenous  Airway Management Planned: Oral ETT  Additional Equipment:   Intra-op Plan:   Post-operative Plan: Extubation in OR  Informed Consent: I have reviewed the patients History and Physical, chart, labs and discussed the procedure including the risks, benefits and alternatives for the proposed anesthesia with the patient or authorized representative who has indicated his/her understanding and acceptance.   Dental advisory given  Plan Discussed with: CRNA, Surgeon and Anesthesiologist  Anesthesia Plan Comments:       Anesthesia Quick Evaluation

## 2011-12-27 NOTE — Progress Notes (Signed)
PT Cancellation Note  Patient Details Name: Solia Fahl MRN: [id number] DOB: 1934-03-18   Cancelled Treatment:    Reason Eval/Treat Not Completed: Fatigue/lethargy limiting ability to participate (pt with I&D today and politely refusing)   Delaney Meigs, PT [phone]

## 2011-12-27 NOTE — Preoperative (Signed)
Beta Blockers   Reason not to administer Beta Blockers:Not Applicable 

## 2011-12-28 LAB — GLUCOSE, CAPILLARY: Glucose-Capillary: 118 mg/dL — ABNORMAL HIGH (ref 70–99)

## 2011-12-28 NOTE — Progress Notes (Signed)
Physical Therapy Treatment Patient Details Name: Nil Anstett MRN: [id number] DOB: [birth year] Today's Date: 12/28/2011 Time: 4098-1191 PT Time Calculation (min): 24 min  PT Assessment / Plan / Recommendation Comments on Treatment Session  Continues to improve with mobility.    Follow Up Recommendations  Home health PT     Does the patient have the potential to tolerate intense rehabilitation     Barriers to Discharge        Equipment Recommendations  None recommended by PT    Recommendations for Other Services    Frequency 7X/week   Plan Discharge plan remains appropriate;Frequency remains appropriate    Precautions / Restrictions Precautions Precautions: Anterior Hip Restrictions Weight Bearing Restrictions: Yes RLE Weight Bearing: Weight bearing as tolerated   Pertinent Vitals/Pain     Mobility  Bed Mobility Bed Mobility: Supine to Sit Supine to Sit: 6: Modified independent (Device/Increase time);With rails Details for Bed Mobility Assistance: No cues needed.  Patient used appropriate technique Transfers Transfers: Sit to Stand;Stand to Sit Sit to Stand: 5: Supervision;From bed Stand to Sit: 5: Supervision;With upper extremity assist;With armrests;To chair/3-in-1 Details for Transfer Assistance: Supervision for safety Ambulation/Gait Ambulation/Gait Assistance: 5: Supervision Ambulation Distance (Feet): 220 Feet Assistive device: Rolling walker Ambulation/Gait Assistance Details: Good posture and technique.  Supervision for safety. Gait Pattern: Step-to pattern Gait velocity: decreased    Exercises Total Joint Exercises Ankle Circles/Pumps: AROM;Both;10 reps;Seated Hip ABduction/ADduction: AROM;Right;10 reps;Seated Long Arc Quad: AROM;Both;10 reps;Seated Marching in Standing: AROM;Both;10 reps;Seated    PT Goals Acute Rehab PT Goals PT Goal Formulation: With patient/family Time For Goal Achievement: 01/01/12 Potential to Achieve Goals:  Good Pt will go Supine/Side to Sit: Independently PT Goal: Supine/Side to Sit - Progress: Updated due to goal met Pt will go Sit to Supine/Side: Independently PT Goal: Sit to Supine/Side - Progress: Updated due to goal met Pt will go Sit to Stand: with modified independence PT Goal: Sit to Stand - Progress: Updated due to goal met Pt will go Stand to Sit: with modified independence PT Goal: Stand to Sit - Progress: Updated due to goals met Pt will Ambulate: >150 feet;with modified independence;with rolling walker PT Goal: Ambulate - Progress: Updated due to goal met  Visit Information  Last PT Received On: 12/28/11 Assistance Needed: +1    Subjective Data  Subjective: Patient agrees to ambulate with PT   Cognition  Overall Cognitive Status: Difficult to assess Difficult to assess due to: Non-English speaking Arousal/Alertness: Awake/alert Orientation Level: Appears intact for tasks assessed Behavior During Session: [facility] for tasks performed    Balance     End of Session PT - End of Session Equipment Utilized During Treatment: Gait belt Activity Tolerance: Patient tolerated treatment well Patient left: in chair;with call bell/phone within reach;with family/visitor present Nurse Communication: Mobility status   GP     Vena Austria 12/28/2011, 1:15 PM Durenda Hurt. Renaldo Fiddler, [facility] Acute Rehab Services Pager [phone]

## 2011-12-28 NOTE — Progress Notes (Signed)
Subjective: 1 Day Post-Op Procedure(s) (LRB): IRRIGATION AND DEBRIDEMENT HIP (Right) Patient reports pain as mild.    Objective: Vital signs in last 24 hours: Temp:  [97.9 F (36.6 C)-98.4 F (36.9 C)] 98.1 F (36.7 C) (11/03 1340) Pulse Rate:  [72-85] 85  (11/03 1340) Resp:  [16-20] 16  (11/03 1340) BP: (129-142)/(47-59) 142/52 mmHg (11/03 1340) SpO2:  [97 %-100 %] 100 % (11/03 1340)  Intake/Output from previous day: 11/02 0701 - 11/03 0700 In: 2130 [P.O.:730; I.V.:1290; IV Piggyback:110] Out: 25 [Blood:25] Intake/Output this shift: Total I/O In: 480 [P.O.:480] Out: -    Basename 12/27/11 0607  HGB 9.4*    Basename 12/27/11 0607  WBC 6.9  RBC 3.49*  HCT 28.6*  PLT 276    Basename 12/27/11 1430  NA 135  K 3.9  CL 105  CO2 20  BUN 9  CREATININE 0.64  GLUCOSE 156*  CALCIUM 9.0   No results found for this basename: LABPT:2,INR:2 in the last 72 hours  slight drainage bloody on dressing. doing well with mobility   Assessment/Plan: 1 Day Post-Op Procedure(s) (LRB): IRRIGATION AND DEBRIDEMENT HIP (Right) Continue ABX therapy due to Post-op infection  Ferrel Simington C 12/28/2011, 5:36 PM

## 2011-12-29 ENCOUNTER — Encounter (HOSPITAL_COMMUNITY): Payer: Self-pay | Admitting: Orthopaedic Surgery

## 2011-12-29 LAB — GLUCOSE, CAPILLARY
Glucose-Capillary: 179 mg/dL — ABNORMAL HIGH (ref 70–99)
Glucose-Capillary: 159 mg/dL — ABNORMAL HIGH (ref 70–99)

## 2011-12-29 MED ORDER — RIFAMPIN 300 MG PO CAPS: 300.0000 mg | ORAL_CAPSULE | Freq: Two times a day (BID) | ORAL | Status: DC

## 2011-12-29 MED ORDER — VANCOMYCIN HCL 1000 MG IV SOLR: 1500.0000 mg | INTRAVENOUS | Status: DC

## 2011-12-29 NOTE — Progress Notes (Signed)
Patient ID: Joyce Gilmore, female   DOB: 1934-03-08, 77 y.o.   MRN: [id number] Still with drainage after second I&D, but should lessen with time.  No redness. No purulence.  Plan on D/C to home today.

## 2011-12-29 NOTE — Discharge Summary (Signed)
Patient ID: Joyce Gilmore MRN: [id number] DOB/AGE: [birth year] 77 y.o.  Admit date: 12/22/2011 Discharge date: 12/29/2011  Admission Diagnoses:  Principal Problem:  *Infection of right prosthetic hip joint   Discharge Diagnoses:  Same  Past Medical History  Diagnosis Date  . Hypertension   . Depression     hx of   . Diabetes mellitus without complication   . Arthritis   . Chronic kidney disease     hx of kidney stones,     Surgeries: Procedure(s): IRRIGATION AND DEBRIDEMENT HIP on 12/22/2011 - 2012-01-26   Consultants:    Discharged Condition: Improved  Hospital Course: Bee Marchiano is an 77 y.o. female who was admitted 12/22/2011 for operative treatment ofInfection of right prosthetic hip joint. Patient has severe unremitting pain that affects sleep, daily activities, and work/hobbies. After pre-op clearance the patient was taken to the operating room on 12/22/2011 - 01-26-2012 and underwent  Procedure(s): IRRIGATION AND DEBRIDEMENT HIP.    Patient was given perioperative antibiotics: Anti-infectives     Start     Dose/Rate Route Frequency Ordered Stop   12/29/11 0000   sodium chloride 0.9 % SOLN 500 mL with vancomycin 1000 MG SOLR 1,500 mg        1,500 mg 250 mL/hr over 120 Minutes Intravenous Every 24 hours 12/29/11 0739     12/29/11 0000   rifampin (RIFADIN) 300 MG capsule        300 mg Oral Every 12 hours 12/29/11 0739     January 26, 2012 1800   vancomycin (VANCOCIN) 1,500 mg in sodium chloride 0.9 % 500 mL IVPB        1,500 mg 250 mL/hr over 120 Minutes Intravenous Every 24 hours 12/26/11 1908     12/25/11 2200   rifampin (RIFADIN) capsule 300 mg        300 mg Oral Every 12 hours 12/25/11 1645     12/23/11 1800   vancomycin (VANCOCIN) 1,250 mg in sodium chloride 0.9 % 250 mL IVPB  Status:  Discontinued        1,250 mg 166.7 mL/hr over 90 Minutes Intravenous Every 24 hours 12/23/11 1434 12/26/11 1907   12/23/11 1400   vancomycin (VANCOCIN)  1,250 mg in sodium chloride 0.9 % 250 mL IVPB  Status:  Discontinued        1,250 mg 166.7 mL/hr over 90 Minutes Intravenous Every 24 hours 12/22/11 2159 12/23/11 1434   12/22/11 1840   gentamicin (GARAMYCIN) injection  Status:  Discontinued          As needed 12/22/11 1841 12/22/11 1915   12/22/11 1838   vancomycin (VANCOCIN) powder  Status:  Discontinued          As needed 12/22/11 1840 12/22/11 1915           Patient was given sequential compression devices, early ambulation, and chemoprophylaxis to prevent DVT.  Patient benefited maximally from hospital stay and there were no complications.    Recent vital signs: Patient Vitals for the past 24 hrs:  BP Temp Temp src Pulse Resp SpO2  12/29/11 0554 142/50 mmHg 98.7 F (37.1 C) - 80  18  100 %  12/28/11 2100 149/49 mmHg 98.6 F (37 C) - 82  18  100 %  12/28/11 1340 142/52 mmHg 98.1 F (36.7 C) Oral 85  16  100 %     Recent laboratory studies:  Basename 26-Jan-2012 1430 2012-01-26 0607  WBC -- 6.9  HGB -- 9.4*  HCT -- 28.6*  PLT -- 276  NA 135 --  K 3.9 --  CL 105 --  CO2 20 --  BUN 9 --  CREATININE 0.64 --  GLUCOSE 156* --  INR -- --  CALCIUM 9.0 --     Discharge Medications:     Medication List     As of 12/29/2011  7:41 AM    STOP taking these medications         ferrous sulfate 325 (65 FE) MG tablet      TAKE these medications         aspirin 325 MG EC tablet   Take 1 tablet (325 mg total) by mouth daily.      calcium-vitamin D 500-200 MG-UNIT per tablet   Commonly known as: OSCAL WITH D   Take 1 tablet by mouth daily.      cetirizine 10 MG tablet   Commonly known as: ZYRTEC   Take 10 mg by mouth daily as needed. For allergies      glipiZIDE 5 MG 24 hr tablet   Commonly known as: GLUCOTROL XL   Take 5 mg by mouth daily.      lisinopril 40 MG tablet   Commonly known as: PRINIVIL,ZESTRIL   Take 40 mg by mouth every morning.      metFORMIN 1000 MG tablet   Commonly known as: GLUCOPHAGE    Take 1,000 mg by mouth 2 (two) times daily with a meal.      methocarbamol 500 MG tablet   Commonly known as: ROBAXIN   Take 1 tablet (500 mg total) by mouth every 6 (six) hours as needed.      pravastatin 40 MG tablet   Commonly known as: PRAVACHOL   Take 40 mg by mouth at bedtime.      rifampin 300 MG capsule   Commonly known as: RIFADIN   Take 1 capsule (300 mg total) by mouth every 12 (twelve) hours.      sitaGLIPtin 100 MG tablet   Commonly known as: JANUVIA   Take 100 mg by mouth every morning.      sodium chloride 0.9 % SOLN 500 mL with vancomycin 1000 MG SOLR 1,500 mg   Inject 1,500 mg into the vein daily.      traMADol 50 MG tablet   Commonly known as: ULTRAM   Take 100 mg by mouth every 8 (eight) hours as needed. For pain      VITAMIN D-400 PO   Take 400 Units by mouth daily.        Diagnostic Studies: Dg Chest 2 View  12/04/2011  *RADIOLOGY REPORT*  Clinical Data: History of hypertension.  Preoperative evaluation prior to hip surgery.  CHEST - 2 VIEW  Comparison: Chest x-ray 05/16/2011.  Findings: Lung volumes are normal.  No consolidative airspace disease.  No pleural effusions.  No pneumothorax.  No pulmonary nodule or mass noted.  Pulmonary vasculature and the cardiomediastinal silhouette are within normal limits. Atherosclerotic calcifications are noted within the arch of the aorta.  IMPRESSION: 1. No radiographic evidence of acute cardiopulmonary disease. 2.  Atherosclerosis.   Original Report Authenticated By: Florencia Reasons, M.D.    Dg Pelvis Portable  12/05/2011  *RADIOLOGY REPORT*  Clinical Data: Right total hip arthroplasty.  Postoperative radiographs.  PORTABLE PELVIS  Comparison: 07/06/2010.  Findings: Revision right hip arthroplasty is present.  There is a new right total hip arthroplasty.  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  Right acetabular screw is now present.  The right hip is located. There is no periprosthetic fracture.  Expected  postsurgical changes in the soft tissues.  IMPRESSION: Uncomplicated new revision right total hip arthroplasty.   Original Report Authenticated By: Andreas Newport, M.D.    Dg Hip Portable 1 View Right  12/05/2011  *RADIOLOGY REPORT*  Clinical Data: Revision right total hip arthroplasty.  PORTABLE RIGHT HIP - 1 VIEW  Comparison: 07/06/2010.  Findings: New right total hip arthroplasty is present with acetabular cup screws.  The  Hip is located.  There is no periprosthetic fracture.  IMPRESSION: Uncomplicated new revision right total hip arthroplasty.   Original Report Authenticated By: Andreas Newport, M.D.     Disposition: 06-Home-Health Care Svc      Discharge Orders    Future Appointments: Provider: Department: Dept Phone: Center:   12/31/2011 8:30 AM [facility] Previsit [facility] [phone] [facility]   01/14/2012 8:30 AM Beverley Fiedler, MD [facility] [phone] [facility]   01/27/2012 9:00 AM Judyann Munson, MD [facility] [phone] [facility]     Future Orders Please Complete By Expires   Diet - low sodium heart healthy      Call MD / Call 911      Comments:   If you experience chest pain or shortness of breath, CALL 911 and be transported to the hospital emergency room.  If you develope a fever above 101 F, pus (white drainage) or increased drainage or redness at the wound, or calf pain, call your surgeon's office.   Constipation Prevention      Comments:   Drink plenty of fluids.  Prune juice may be helpful.  You may use a stool softener, such as Colace (over the counter) 100 mg twice a day.  Use MiraLax (over the counter) for constipation as needed.   Increase activity slowly as tolerated         Follow-up Information    Follow up with Kathryne Hitch, MD. In 2 weeks.   Contact information:   [address] [place] [address] [phone]            Signed: Kathryne Hitch 12/29/2011, 7:41 AM

## 2011-12-29 NOTE — Progress Notes (Signed)
Pt discharged to home via car by daughter. Discharge instructions explained pt verbalized understanding.

## 2011-12-29 NOTE — Progress Notes (Signed)
ANTIBIOTIC CONSULT NOTE - FOLLOW UP  Pharmacy Consult for Vancomycin Indication: THR infection with MRSA  No Known Allergies  Patient Measurements: Height: 4\' 10"  (147.3 cm) Weight: 150 lb (68.04 kg) IBW/kg (Calculated) : 40.9   Vital Signs: Temp: 98.7 F (37.1 C) (11/04 0554) BP: 142/50 mmHg (11/04 0554) Pulse Rate: 80  (11/04 0554) Intake/Output from previous day: 11/03 0701 - 11/04 0700 In: 1658 [P.O.:1210; I.V.:448] Out: -  Intake/Output from this shift:    Labs:  Basename 12/27/11 1430 12/27/11 0607  WBC -- 6.9  HGB -- 9.4*  PLT -- 276  LABCREA -- --  CREATININE 0.64 --   Estimated Creatinine Clearance: 49.6 ml/min (by C-G formula based on Cr of 0.64).  Basename 12/26/11 1810  VANCOTROUGH 14.3  VANCOPEAK --  Drue Dun --  GENTTROUGH --  GENTPEAK --  GENTRANDOM --  TOBRATROUGH --  TOBRAPEAK --  TOBRARND --  AMIKACINPEAK --  AMIKACINTROU --  AMIKACIN --     Microbiology: Recent Results (from the past 720 hour(s))  SURGICAL PCR SCREEN     Status: Abnormal   Collection Time   12/04/11  1:56 PM      Component Value Range Status Comment   MRSA, PCR NEGATIVE  NEGATIVE Final    Staphylococcus aureus POSITIVE (*) NEGATIVE Final   URINE CULTURE     Status: Normal   Collection Time   12/22/11  3:12 PM      Component Value Range Status Comment   Specimen Description URINE, RANDOM   Final    Special Requests NONE   Final    Culture  Setup Time 12/22/2011 17:28   Final    Colony Count >=100,000 COLONIES/ML   Final    Culture ESCHERICHIA COLI   Final    Report Status 12/24/2011 FINAL   Final    Organism ID, Bacteria ESCHERICHIA COLI   Final   SURGICAL PCR SCREEN     Status: Normal   Collection Time   12/22/11  4:25 PM      Component Value Range Status Comment   MRSA, PCR NEGATIVE  NEGATIVE Final    Staphylococcus aureus NEGATIVE  NEGATIVE Final   CULTURE, ROUTINE-ABSCESS     Status: Normal   Collection Time   12/22/11  6:03 PM      Component Value  Range Status Comment   Specimen Description ABSCESS HIP RIGHT   Final    Special Requests SUPERFICIAL   Final    Gram Stain     Final    Value: FEW WBC PRESENT,BOTH PMN AND MONONUCLEAR     RARE GRAM POSITIVE COCCI     IN PAIRS Performed at [facility]   Culture     Final    Value: MODERATE METHICILLIN RESISTANT STAPHYLOCOCCUS AUREUS     Note: RIFAMPIN AND GENTAMICIN SHOULD NOT BE USED AS SINGLE DRUGS FOR TREATMENT OF STAPH INFECTIONS. CRITICAL RESULT CALLED TO, READ BACK BY AND VERIFIED WITH: CAROL S@1018  ON [id number] BY [facility]   Report Status 12/25/2011 FINAL   Final    Organism ID, Bacteria METHICILLIN RESISTANT STAPHYLOCOCCUS AUREUS   Final   ANAEROBIC CULTURE     Status: Normal   Collection Time   12/22/11  6:03 PM      Component Value Range Status Comment   Specimen Description ABSCESS HIP RIGHT   Final    Special Requests SUPERFICIAL   Final    Gram Stain     Final    Value: FEW WBC  PRESENT,BOTH PMN AND MONONUCLEAR     RARE GRAM POSITIVE COCCI     IN PAIRS Performed at [facility]   Culture NO ANAEROBES ISOLATED   Final    Report Status 12/27/2011 FINAL   Final   GRAM STAIN     Status: Normal   Collection Time   12/22/11  6:03 PM      Component Value Range Status Comment   Specimen Description ABSCESS HIP RIGHT   Final    Special Requests SUPERFICIAL   Final    Gram Stain     Final    Value: FEW WBC PRESENT,BOTH PMN AND MONONUCLEAR     RARE GRAM POSITIVE COCCI IN PAIRS   Report Status 12/22/2011 FINAL   Final   CULTURE, ROUTINE-ABSCESS     Status: Normal   Collection Time   12/22/11  6:20 PM      Component Value Range Status Comment   Specimen Description ABSCESS HIP RIGHT   Final    Special Requests DEEP   Final    Gram Stain     Final    Value: RARE WBC PRESENT,BOTH PMN AND MONONUCLEAR     NO ORGANISMS SEEN     Performed at [facility]   Culture     Final    Value: MODERATE METHICILLIN RESISTANT STAPHYLOCOCCUS AUREUS     Note: RIFAMPIN AND  GENTAMICIN SHOULD NOT BE USED AS SINGLE DRUGS FOR TREATMENT OF STAPH INFECTIONS. CRITICAL RESULT CALLED TO, READ BACK BY AND VERIFIED WITH: CAROL S@1018  ON [id number] BY [facility]   Report Status 12/25/2011 FINAL   Final    Organism ID, Bacteria METHICILLIN RESISTANT STAPHYLOCOCCUS AUREUS   Final   ANAEROBIC CULTURE     Status: Normal   Collection Time   12/22/11  6:20 PM      Component Value Range Status Comment   Specimen Description ABSCESS HIP RIGHT   Final    Special Requests DEEP   Final    Gram Stain     Final    Value: RARE WBC PRESENT,BOTH PMN AND MONONUCLEAR     NO ORGANISMS SEEN     Performed at [facility]   Culture NO ANAEROBES ISOLATED   Final    Report Status 12/27/2011 FINAL   Final   GRAM STAIN     Status: Normal   Collection Time   12/22/11  6:20 PM      Component Value Range Status Comment   Specimen Description ABSCESS HIP RIGHT   Final    Special Requests DEEP   Final    Gram Stain     Final    Value: RARE WBC PRESENT,BOTH PMN AND MONONUCLEAR     NO ORGANISMS SEEN   Report Status 12/22/2011 FINAL   Final     Anti-infectives     Start     Dose/Rate Route Frequency Ordered Stop   12/29/11 0000   sodium chloride 0.9 % SOLN 500 mL with vancomycin 1000 MG SOLR 1,500 mg        1,500 mg 250 mL/hr over 120 Minutes Intravenous Every 24 hours 12/29/11 0739     12/29/11 0000   rifampin (RIFADIN) 300 MG capsule        300 mg Oral Every 12 hours 12/29/11 0739     12/27/11 1800   vancomycin (VANCOCIN) 1,500 mg in sodium chloride 0.9 % 500 mL IVPB        1,500 mg 250 mL/hr over 120  Minutes Intravenous Every 24 hours 12/26/11 1908     12/25/11 2200   rifampin (RIFADIN) capsule 300 mg        300 mg Oral Every 12 hours 12/25/11 1645     12/23/11 1800   vancomycin (VANCOCIN) 1,250 mg in sodium chloride 0.9 % 250 mL IVPB  Status:  Discontinued        1,250 mg 166.7 mL/hr over 90 Minutes Intravenous Every 24 hours 12/23/11 1434 12/26/11 1907   12/23/11 1400   vancomycin  (VANCOCIN) 1,250 mg in sodium chloride 0.9 % 250 mL IVPB  Status:  Discontinued        1,250 mg 166.7 mL/hr over 90 Minutes Intravenous Every 24 hours 12/22/11 2159 12/23/11 1434   12/22/11 1840   gentamicin (GARAMYCIN) injection  Status:  Discontinued          As needed 12/22/11 1841 12/22/11 1915   12/22/11 1838   vancomycin (VANCOCIN) powder  Status:  Discontinued          As needed 12/22/11 1840 12/22/11 1915          Assessment: MRSA THR Infection:  On Day #7 of a planned 6-week course of Vancomycin.  Regimen was adjusted 11/1 to target a slightly higher trough.  She is going to be discharged today.  Goal of Therapy:  Vancomycin trough level 15-20 mcg/ml  Plan:  Continue Vancomycin 1500mg  IV q24h Check CMET, CBC, Vancomycin trough weekly while on IV Vancomycin.  Recommend checking these labs on 11/5 or 11/6.  Joyce Gilmore, Pharm.D., BCPS Clinical Pharmacist  Phone [phone] Pager [phone] 12/29/2011, 10:34 AM

## 2011-12-31 ENCOUNTER — Ambulatory Visit (AMBULATORY_SURGERY_CENTER): Payer: Medicaid Other | Admitting: *Deleted

## 2011-12-31 VITALS — Ht 62.0 in | Wt 146.5 lb

## 2011-12-31 DIAGNOSIS — Z1211 Encounter for screening for malignant neoplasm of colon: Secondary | ICD-10-CM

## 2011-12-31 MED ORDER — MOVIPREP 100 G PO SOLR: ORAL | Status: DC

## 2011-12-31 NOTE — Progress Notes (Signed)
Patient's family member at side,was interpreter for patient.

## 2012-01-02 ENCOUNTER — Encounter (HOSPITAL_COMMUNITY): Payer: Self-pay | Admitting: *Deleted

## 2012-01-02 ENCOUNTER — Emergency Department (HOSPITAL_COMMUNITY): Payer: Medicaid Other

## 2012-01-02 ENCOUNTER — Inpatient Hospital Stay (HOSPITAL_COMMUNITY)
Admission: EM | Admit: 2012-01-02 | Discharge: 2012-01-08 | DRG: 813 | Disposition: A | Discharge status: Home / Self Care | Payer: Medicaid Other | Attending: Internal Medicine | Admitting: Internal Medicine

## 2012-01-02 DIAGNOSIS — Y92009 Unspecified place in unspecified non-institutional (private) residence as the place of occurrence of the external cause: Secondary | ICD-10-CM

## 2012-01-02 DIAGNOSIS — E860 Dehydration: Secondary | ICD-10-CM | POA: Diagnosis present

## 2012-01-02 DIAGNOSIS — T365X5A Adverse effect of aminoglycosides, initial encounter: Secondary | ICD-10-CM | POA: Diagnosis present

## 2012-01-02 DIAGNOSIS — E236 Other disorders of pituitary gland: Secondary | ICD-10-CM | POA: Diagnosis present

## 2012-01-02 DIAGNOSIS — R112 Nausea with vomiting, unspecified: Secondary | ICD-10-CM | POA: Diagnosis present

## 2012-01-02 DIAGNOSIS — E119 Type 2 diabetes mellitus without complications: Secondary | ICD-10-CM | POA: Diagnosis present

## 2012-01-02 DIAGNOSIS — D638 Anemia in other chronic diseases classified elsewhere: Secondary | ICD-10-CM | POA: Diagnosis present

## 2012-01-02 DIAGNOSIS — J189 Pneumonia, unspecified organism: Secondary | ICD-10-CM

## 2012-01-02 DIAGNOSIS — T368X5A Adverse effect of other systemic antibiotics, initial encounter: Secondary | ICD-10-CM | POA: Diagnosis present

## 2012-01-02 DIAGNOSIS — F329 Major depressive disorder, single episode, unspecified: Secondary | ICD-10-CM | POA: Diagnosis present

## 2012-01-02 DIAGNOSIS — R0789 Other chest pain: Secondary | ICD-10-CM | POA: Diagnosis present

## 2012-01-02 DIAGNOSIS — A4902 Methicillin resistant Staphylococcus aureus infection, unspecified site: Secondary | ICD-10-CM | POA: Diagnosis present

## 2012-01-02 DIAGNOSIS — Z96649 Presence of unspecified artificial hip joint: Secondary | ICD-10-CM

## 2012-01-02 DIAGNOSIS — T8450XA Infection and inflammatory reaction due to unspecified internal joint prosthesis, initial encounter: Secondary | ICD-10-CM | POA: Diagnosis present

## 2012-01-02 DIAGNOSIS — I129 Hypertensive chronic kidney disease with stage 1 through stage 4 chronic kidney disease, or unspecified chronic kidney disease: Secondary | ICD-10-CM | POA: Diagnosis present

## 2012-01-02 DIAGNOSIS — D696 Thrombocytopenia, unspecified: Secondary | ICD-10-CM | POA: Diagnosis present

## 2012-01-02 DIAGNOSIS — I10 Essential (primary) hypertension: Secondary | ICD-10-CM | POA: Diagnosis present

## 2012-01-02 DIAGNOSIS — T84038A Mechanical loosening of other internal prosthetic joint, initial encounter: Secondary | ICD-10-CM

## 2012-01-02 DIAGNOSIS — R079 Chest pain, unspecified: Secondary | ICD-10-CM | POA: Diagnosis present

## 2012-01-02 DIAGNOSIS — Z96659 Presence of unspecified artificial knee joint: Secondary | ICD-10-CM

## 2012-01-02 DIAGNOSIS — K5730 Diverticulosis of large intestine without perforation or abscess without bleeding: Secondary | ICD-10-CM

## 2012-01-02 DIAGNOSIS — D6959 Other secondary thrombocytopenia: Principal | ICD-10-CM | POA: Diagnosis present

## 2012-01-02 DIAGNOSIS — Z792 Long term (current) use of antibiotics: Secondary | ICD-10-CM

## 2012-01-02 DIAGNOSIS — Z7982 Long term (current) use of aspirin: Secondary | ICD-10-CM

## 2012-01-02 DIAGNOSIS — R040 Epistaxis: Secondary | ICD-10-CM | POA: Diagnosis present

## 2012-01-02 DIAGNOSIS — R197 Diarrhea, unspecified: Secondary | ICD-10-CM | POA: Diagnosis present

## 2012-01-02 DIAGNOSIS — N289 Disorder of kidney and ureter, unspecified: Secondary | ICD-10-CM

## 2012-01-02 DIAGNOSIS — T8451XA Infection and inflammatory reaction due to internal right hip prosthesis, initial encounter: Secondary | ICD-10-CM

## 2012-01-02 DIAGNOSIS — E785 Hyperlipidemia, unspecified: Secondary | ICD-10-CM | POA: Diagnosis present

## 2012-01-02 DIAGNOSIS — N390 Urinary tract infection, site not specified: Secondary | ICD-10-CM

## 2012-01-02 DIAGNOSIS — Z79899 Other long term (current) drug therapy: Secondary | ICD-10-CM

## 2012-01-02 DIAGNOSIS — M129 Arthropathy, unspecified: Secondary | ICD-10-CM | POA: Diagnosis present

## 2012-01-02 DIAGNOSIS — E875 Hyperkalemia: Secondary | ICD-10-CM

## 2012-01-02 DIAGNOSIS — F3289 Other specified depressive episodes: Secondary | ICD-10-CM | POA: Diagnosis present

## 2012-01-02 DIAGNOSIS — R42 Dizziness and giddiness: Secondary | ICD-10-CM

## 2012-01-02 DIAGNOSIS — N189 Chronic kidney disease, unspecified: Secondary | ICD-10-CM | POA: Diagnosis present

## 2012-01-02 DIAGNOSIS — Y831 Surgical operation with implant of artificial internal device as the cause of abnormal reaction of the patient, or of later complication, without mention of misadventure at the time of the procedure: Secondary | ICD-10-CM | POA: Diagnosis present

## 2012-01-02 DIAGNOSIS — E871 Hypo-osmolality and hyponatremia: Secondary | ICD-10-CM | POA: Diagnosis present

## 2012-01-02 LAB — CBC WITH DIFFERENTIAL/PLATELET
Basophils Absolute: 0 10*3/uL (ref 0.0–0.1)
Basophils Relative: 0 % (ref 0–1)
Eosinophils Relative: 6 % — ABNORMAL HIGH (ref 0–5)
HCT: 28.1 % — ABNORMAL LOW (ref 36.0–46.0)
Lymphocytes Relative: 14 % (ref 12–46)
MCHC: 34.2 g/dL (ref 30.0–36.0)
Monocytes Absolute: 0.7 10*3/uL (ref 0.1–1.0)
Neutro Abs: 4.9 10*3/uL (ref 1.7–7.7)
Platelets: 101 10*3/uL — ABNORMAL LOW (ref 150–400)
RDW: 14.5 % (ref 11.5–15.5)
WBC: 7 10*3/uL (ref 4.0–10.5)

## 2012-01-02 LAB — URINALYSIS, ROUTINE W REFLEX MICROSCOPIC
Ketones, ur: NEGATIVE mg/dL
Nitrite: NEGATIVE
Protein, ur: NEGATIVE mg/dL

## 2012-01-02 LAB — URINE MICROSCOPIC-ADD ON

## 2012-01-02 LAB — BASIC METABOLIC PANEL
Calcium: 8.5 mg/dL (ref 8.4–10.5)
Chloride: 91 mEq/L — ABNORMAL LOW (ref 96–112)
Creatinine, Ser: 1.2 mg/dL — ABNORMAL HIGH (ref 0.50–1.10)
GFR calc Af Amer: 50 mL/min — ABNORMAL LOW (ref >90)
Sodium: 121 mEq/L — ABNORMAL LOW (ref 135–145)

## 2012-01-02 LAB — D-DIMER, QUANTITATIVE: D-Dimer, Quant: 1.8 ug/mL-FEU — ABNORMAL HIGH (ref 0.00–0.48)

## 2012-01-02 MED ORDER — SODIUM CHLORIDE 0.9 % IV BOLUS (SEPSIS)
1000.0000 mL | Freq: Once | INTRAVENOUS | Status: AC
  Administered 2012-01-02: 1000 mL via INTRAVENOUS

## 2012-01-02 MED ORDER — ONDANSETRON HCL 4 MG/2ML IJ SOLN
4.0000 mg | Freq: Once | INTRAMUSCULAR | Status: AC
  Administered 2012-01-02: 4 mg via INTRAVENOUS
  Filled 2012-01-02: qty 2

## 2012-01-02 NOTE — ED Notes (Signed)
Admitting MD at bedside.

## 2012-01-02 NOTE — H&P (Signed)
PCP:     Frazier Richards, PA-C    Chief Complaint:   Neck pain, fatigue, nausea and vomiting  HPI: Joyce Gilmore is a 77 y.o. female   has a past medical history of Hypertension; Depression; Diabetes mellitus without complication; Arthritis; and Chronic kidney disease.   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 Done by Dr. Magnus Ivan. She was discharged to home on IV vancomycin the PICC line and rifampin on November 4. Her family has been administered vancomycin and has been stating that she had been having some nausea and vomiting associated with getting the medications this been going on for about a week now. She's been having some nonspecific chest pain which he can't quite elaborate on. Patient is not eating well. No fever no chills. She has been having Occasional diarrhea (She has 3-4 BM a day). Occasional coughing. Also she had had some headache and neck pain although no meningismus neck has been supple. There is no increased pain or discharge at the surgical site. Family states that she had had some worsening shortness of breath. She seems to run out of breath when she is speaking.  Review of Systems:     Pertinent positives include: fatigue,  chest pain,  shortness of breath at rest.dyspnea on exertion, nausea, vomiting, diarrhea, productive cough,  Constitutional:  No weight loss, night sweats, Fevers, chills,weight loss  HEENT:  No headaches, Difficulty swallowing,Tooth/dental problems,Sore throat,  No sneezing, itching, ear ache, nasal congestion, post nasal drip,  Cardio-vascular:  NoOrthopnea, PND, anasarca, dizziness, palpitations.no Bilateral lower extremity swelling  GI:  No heartburn, indigestion, abdominal pain, change in bowel habits, loss of appetite, melena, blood in stool, hematemesis Resp:     No excess mucus, no  No non-productive  cough, No coughing up of blood.No change in color of mucus.No wheezing. Skin:  no rash or lesions. No jaundice GU:  no dysuria, change in color of urine, no urgency or frequency. No straining to urinate.  No flank pain.  Musculoskeletal:  No joint pain or no joint swelling. No decreased range of motion. No back pain.  Psych:  No change in mood or affect. No depression or anxiety. No memory loss.  Neuro: no localizing neurological complaints, no tingling, no weakness, no double vision, no gait abnormality, no slurred speech, no confusion  Otherwise ROS are negative except for above, 10 systems were reviewed  Past Medical History: Past Medical History  Diagnosis Date  . Hypertension   . Depression     hx of   . Diabetes mellitus without complication   . Arthritis   . Chronic kidney disease     hx of kidney stones,    Past Surgical History  Procedure Date  . Joint replacement     bilateral knee, right hip   . Total hip revision 12/05/2011    Procedure: TOTAL HIP REVISION;  Surgeon: Kathryne Hitch, MD;  Location: [facility] ORS;  Service: Orthopedics;  Laterality: Right;  Right Hip Revision Arthroplasty to Total Hip, Excision of Old Implant  . Incision and drainage hip 12/22/2011    Procedure: IRRIGATION AND DEBRIDEMENT HIP;  Surgeon: Kathryne Hitch, MD;  Location: [facility] OR;  Service: Orthopedics;  Laterality: Right;  Irrigation and debridement right hip  . Incision and drainage hip 12/27/2011    Procedure: IRRIGATION AND DEBRIDEMENT HIP;  Surgeon: Kathryne Hitch, MD;  Location:  [facility] OR;  Service: Orthopedics;  Laterality: Right;  Repeat irrigation and debridement     Medications: Prior to Admission medications   Medication Sig Start Date End Date Taking? Authorizing Provider  aspirin EC 325 MG EC tablet Take 1 tablet (325 mg total) by mouth daily. 12/08/11  Yes Kathryne Hitch, MD  calcium-vitamin D (OSCAL WITH D) 500-200 MG-UNIT per tablet Take 1 tablet by  mouth daily.   Yes Historical Provider, MD  Cholecalciferol (VITAMIN D-400 PO) Take 400 Units by mouth daily.   Yes Historical Provider, MD  glipiZIDE (GLUCOTROL XL) 5 MG 24 hr tablet Take 5 mg by mouth daily.   Yes Historical Provider, MD  lisinopril (PRINIVIL,ZESTRIL) 40 MG tablet Take 40 mg by mouth every morning.    Yes Historical Provider, MD  metFORMIN (GLUCOPHAGE) 1000 MG tablet Take 1,000 mg by mouth 2 (two) times daily with a meal.   Yes Historical Provider, MD  methocarbamol (ROBAXIN) 500 MG tablet Take 1 tablet (500 mg total) by mouth every 6 (six) hours as needed. 12/08/11  Yes Kathryne Hitch, MD  ondansetron (ZOFRAN) 4 MG tablet Take 4 mg by mouth every 8 (eight) hours as needed. For nausea   Yes Historical Provider, MD  oxyCODONE-acetaminophen (PERCOCET/ROXICET) 5-325 MG per tablet Take 1 tablet by mouth every 8 (eight) hours as needed. For pain   Yes Historical Provider, MD  pravastatin (PRAVACHOL) 40 MG tablet Take 40 mg by mouth at bedtime.    Yes Historical Provider, MD  rifampin (RIFADIN) 300 MG capsule Take 1 capsule (300 mg total) by mouth every 12 (twelve) hours. 12/29/11  Yes Kathryne Hitch, MD  sitaGLIPtin (JANUVIA) 100 MG tablet Take 100 mg by mouth every morning.    Yes Historical Provider, MD  sodium chloride 0.9 % SOLN 500 mL with vancomycin 1000 MG SOLR 1,500 mg Inject 1,500 mg into the vein daily. 12/29/11  Yes Kathryne Hitch, MD    Allergies:  No Known Allergies  Social History:  Ambulatory  walker cane Lives at home with family    reports that she has never smoked. She has never used smokeless tobacco. She reports that she does not drink alcohol or use illicit drugs.   Family History: family history includes Hypertension in her mother.  There is no history of Colon cancer.    Physical Exam: Patient Vitals for the past 24 hrs:  BP Temp Temp src Resp SpO2  01/02/12 1811 113/57 mmHg 98.4 F (36.9 C) Oral 20  97 %  01/02/12 1803 - - -  - 98 %    1. General:  in No Acute distress 2. Psychological: Alert and    Oriented 3. Head/ENT:    Dry Mucous Membranes                          Head Non traumatic, neck supple                          Normal  Dentition 4. SKIN:   decreased Skin turgor,  Skin clean Dry and intact no rash 5. Heart: Regular rate and rhythm no Murmur, Rub or gallop 6. Lungs: Clear to auscultation bilaterally, no wheezes or crackles   7. Abdomen: Soft, non-tender, Non distended 8. Lower extremities: no clubbing, cyanosis, or edema 9. Neurologically Grossly intact, moving all 4 extremities equally 10. MSK: Normal range of motion  body mass index is unknown  because there is no height or weight on file.   Labs on Admission:   [facility] 01/02/12 1836  NA 121*  K 4.9  CL 91*  CO2 20  GLUCOSE 107*  BUN 12  CREATININE 1.20*  CALCIUM 8.5  MG --  PHOS --   No results found for this basename: AST:2,ALT:2,ALKPHOS:2,BILITOT:2,PROT:2,ALBUMIN:2 in the last 72 hours No results found for this basename: LIPASE:2,AMYLASE:2 in the last 72 hours  Basename 01/02/12 1836  WBC 7.0  NEUTROABS 4.9  HGB 9.6*  HCT 28.1*  MCV 81.0  PLT 101*   No results found for this basename: CKTOTAL:3,CKMB:3,CKMBINDEX:3,TROPONINI:3 in the last 72 hours No results found for this basename: TSH,T4TOTAL,FREET3,T3FREE,THYROIDAB in the last 72 hours No results found for this basename: VITAMINB12:2,FOLATE:2,FERRITIN:2,TIBC:2,IRON:2,RETICCTPCT:2 in the last 72 hours No results found for this basename: HGBA1C    The CrCl is unknown because both a height and weight (above a minimum accepted value) are required for this calculation. ABG    Component Value Date/Time   TCO2 24 10/23/2011 1638     No results found for this basename: DDIMER     Other results:  I have pearsonaly reviewed this: ECG REPORT  Rate: 80  Rhythm: NSR ST&T Change: no ischemic changes  UA no evidence of infection    Cultures:    Component Value  Date/Time   SDES ABSCESS HIP RIGHT 12/22/2011 1820   SDES ABSCESS HIP RIGHT 12/22/2011 1820   SDES ABSCESS HIP RIGHT 12/22/2011 1820   SPECREQUEST DEEP 12/22/2011 1820   SPECREQUEST DEEP 12/22/2011 1820   SPECREQUEST DEEP 12/22/2011 1820   CULT  Value: MODERATE METHICILLIN RESISTANT STAPHYLOCOCCUS AUREUS Note: RIFAMPIN AND GENTAMICIN SHOULD NOT BE USED AS SINGLE DRUGS FOR TREATMENT OF STAPH INFECTIONS. CRITICAL RESULT CALLED TO, READ BACK BY AND VERIFIED WITH: CAROL S@1018  ON [id number] BY [facility] 12/22/2011 1820   CULT NO ANAEROBES ISOLATED 12/22/2011 1820   REPTSTATUS 12/25/2011 FINAL 12/22/2011 1820   REPTSTATUS 12/27/2011 FINAL 12/22/2011 1820   REPTSTATUS 12/22/2011 FINAL 12/22/2011 1820       Radiological Exams on Admission: Dg Chest 2 View  01/02/2012  *RADIOLOGY REPORT*  Clinical Data: Weakness, shortness of breath  CHEST - 2 VIEW  Comparison: 12/04/2011  Findings: Cardiomediastinal silhouette is stable.  No acute infiltrate or pleural effusion.  No pulmonary edema.  There is a right arm PICC line with tip in right atrium.  For distal SVC position the PICC line should be retracted about 2.5 cm. No diagnostic pneumothorax.  IMPRESSION:  No acute infiltrate or pleural effusion.  No pulmonary edema. There is a right arm PICC line with tip in right atrium.  For distal SVC position the PICC line should be retracted about 2.5 cm. No diagnostic pneumothorax.   Original Report Authenticated By: Natasha Mead, M.D.     Chart has been reviewed  Assessment/Plan  This is a 77 year old female with history of recent head debridement for the MRSA infection currently on IV vancomycin and rifampin presents with dehydration poor by mouth intake and hyponatremia.   Present on Admission:  . Hyponatremia - patient has history of hyponatremia in the past her lowest sodium was 129 in the end of October but her last sodium was 135. She does appear to be dehydrated and had had decreased by mouth intake. We'll give  IV fluids check orthostatics if possible obtain urine electrolytes and check TSH. Will follow sodium levels carefully and repeat to avoid over aggressive correction.  Marland Kitchen HYPERTENSION - continue home medications  .  DIABETES MELLITUS, TYPE II -sliding scale   . Chest pain - cycle cardiac enzymes given worsening dyspnea and elevated d-dimer would obtain CT image of the chest to rule out PE. Obtain echo gram    Prophylaxis:  Lovenox, Protonix  CODE STATUS: FULL CODE  Other plan as per orders.  I have spent a total of 60 min on this admission  Gayl Ivanoff 01/02/2012, 9:12 PM

## 2012-01-02 NOTE — ED Provider Notes (Signed)
I saw and evaluated the patient, reviewed the resident's note and I agree with the findings and plan.   .Face to face Exam:  General:  Awake HEENT:  Atraumatic Resp:  Normal effort Abd:  Nondistended Neuro:No focal weakness Lymph: No adenopathy   Nelia Shi, MD 01/02/12 2230

## 2012-01-02 NOTE — ED Notes (Signed)
IV team at bedside 

## 2012-01-02 NOTE — ED Notes (Signed)
Pt is here for multiple symptoms after getting a dose of IV antibiotic (vancomycin).  Pt states that she had headache, neck pain, nausea and CP and sob after getting this dose.  Pt reports same symptoms with doses all week, just stronger today.  Pts family member states that when pt had her first dose on Monday in the hospital she had nausea, vomiting and head and neck pain and they were told that this is normal and to continue to take medication at home, family member states that she is concerned about pt taking this medication.  No distress on arrival, EKG was wnl for ems.

## 2012-01-02 NOTE — ED Provider Notes (Signed)
History     CSN: [id number]  Arrival date & time 01/02/12  1750   First MD Initiated Contact with Patient 01/02/12 1813      Chief Complaint  Patient presents with  . Allergic Reaction    (Consider location/radiation/quality/duration/timing/severity/associated sxs/prior treatment) HPI Comments: Patient was recently hospitalized for a right septic hip, her last irrigation in the operating room was November 2. Cultures obtained October 28 were consistent with methicillin-resistant staph aureus. She was started on rifampin and IV vancomycin through a PICC line in her right arm. Since receiving vancomycin on Monday the patient has experienced symptoms including generalized weakness, headache, nausea, vomiting, diarrhea. She has not had much to drink for the past 2 days per the daughter who is her caregiver.  Patient is a 77 y.o. female presenting with general illness. The history is provided by the patient and a relative. No language interpreter was used.  Illness  The current episode started 3 to 5 days ago. The onset was gradual. The problem occurs continuously. The problem has been gradually worsening. The problem is moderate. Nothing relieves the symptoms. Nothing aggravates the symptoms. Associated symptoms include diarrhea, nausea, vomiting, headaches and neck pain. Pertinent negatives include no fever, no abdominal pain, no congestion, no rhinorrhea, no stridor, no cough, no wheezing and no eye discharge. She has been less active. She has been drinking less than usual and eating less than usual. Urine output has been normal. The last void occurred less than 6 hours ago. There were no sick contacts. Recently, medical care has been given at this facility. Services received include medications given.    Past Medical History  Diagnosis Date  . Hypertension   . Depression     hx of   . Diabetes mellitus without complication   . Arthritis   . Chronic kidney disease     hx of kidney stones,      Past Surgical History  Procedure Date  . Joint replacement     bilateral knee, right hip   . Total hip revision 12/05/2011    Procedure: TOTAL HIP REVISION;  Surgeon: Kathryne Hitch, MD;  Location: [facility] OR[facility];  Service: Orthopedics;  Laterality: Right;  Right Hip Revision Arthroplasty to Total Hip, Excision of Old Implant  . Incision and drainage hip 12/22/2011    Procedure: IRRIGATION AND DEBRIDEMENT HIP;  Surgeon: Kathryne Hitch, MD;  Location: [facility] OR;  Service: Orthopedics;  Laterality: Right;  Irrigation and debridement right hip  . Incision and drainage hip 12/27/2011    Procedure: IRRIGATION AND DEBRIDEMENT HIP;  Surgeon: Kathryne Hitch, MD;  Location: [facility] OR;  Service: Orthopedics;  Laterality: Right;  Repeat irrigation and debridement    Family History  Problem Relation Age of Onset  . Colon cancer Neg Hx     History  Substance Use Topics  . Smoking status: Never Smoker   . Smokeless tobacco: Never Used  . Alcohol Use: No    OB History    Grav Para Term Preterm Abortions TAB SAB Ect Mult Living                  Review of Systems  Constitutional: Positive for chills, activity change, appetite change and fatigue. Negative for fever.  HENT: Positive for neck pain. Negative for congestion, rhinorrhea, neck stiffness and sinus pressure.   Eyes: Negative for discharge and visual disturbance.  Respiratory: Negative for cough, chest tightness, shortness of breath, wheezing and stridor.   Cardiovascular: Negative for chest  pain and leg swelling.  Gastrointestinal: Positive for nausea, vomiting and diarrhea. Negative for abdominal pain and abdominal distention.  Genitourinary: Negative for decreased urine volume and difficulty urinating.  Musculoskeletal: Positive for arthralgias (r hip, improving). Negative for back pain.  Skin: Negative for color change and pallor.  Neurological: Positive for weakness (generalized) and headaches. Negative for  light-headedness.  Psychiatric/Behavioral: Negative for behavioral problems and agitation.  All other systems reviewed and are negative.    Allergies  Review of patient's allergies indicates no known allergies.  Home Medications   Current Outpatient Rx  Name  Route  Sig  Dispense  Refill  . ASPIRIN 325 MG PO TBEC   Oral   Take 1 tablet (325 mg total) by mouth daily.   30 tablet   0   . CALCIUM CARBONATE-VITAMIN D 500-200 MG-UNIT PO TABS   Oral   Take 1 tablet by mouth daily.         Marland Kitchen VITAMIN D-400 PO   Oral   Take 400 Units by mouth daily.         Marland Kitchen GLIPIZIDE ER 5 MG PO TB24   Oral   Take 5 mg by mouth daily.         Marland Kitchen LISINOPRIL 40 MG PO TABS   Oral   Take 40 mg by mouth every morning.          Marland Kitchen METFORMIN HCL 1000 MG PO TABS   Oral   Take 1,000 mg by mouth 2 (two) times daily with a meal.         . METHOCARBAMOL 500 MG PO TABS   Oral   Take 1 tablet (500 mg total) by mouth every 6 (six) hours as needed.   60 tablet   0   . ONDANSETRON HCL 4 MG PO TABS   Oral   Take 4 mg by mouth every 8 (eight) hours as needed. For nausea         . OXYCODONE-ACETAMINOPHEN 5-325 MG PO TABS   Oral   Take 1 tablet by mouth every 8 (eight) hours as needed. For pain         . PRAVASTATIN SODIUM 40 MG PO TABS   Oral   Take 40 mg by mouth at bedtime.          Marland Kitchen RIFAMPIN 300 MG PO CAPS   Oral   Take 1 capsule (300 mg total) by mouth every 12 (twelve) hours.   90 capsule   1   . SITAGLIPTIN PHOSPHATE 100 MG PO TABS   Oral   Take 100 mg by mouth every morning.          Marland Kitchen VANCOMYCIN IVPB   Intravenous   Inject 1,500 mg into the vein daily.   4000 mL   1     BP 113/57  Temp 98.4 F (36.9 C) (Oral)  Resp 20  SpO2 97%  Physical Exam  Nursing note and vitals reviewed. Constitutional: She is oriented to person, place, and time. She appears well-developed and well-nourished. No distress.  HENT:  Head: Normocephalic and atraumatic.    Mouth/Throat: No oropharyngeal exudate.  Eyes: EOM are normal. Pupils are equal, round, and reactive to light. Right eye exhibits no discharge. Left eye exhibits no discharge.  Neck: Normal range of motion. Neck supple. No JVD present.  Cardiovascular: Normal rate, regular rhythm and normal heart sounds.   Pulmonary/Chest: Effort normal and breath sounds normal. No stridor. No respiratory distress. She exhibits  no tenderness.  Abdominal: Soft. Bowel sounds are normal. She exhibits no distension. There is no tenderness. There is no guarding.  Musculoskeletal: Normal range of motion. She exhibits no edema and no tenderness.       picc site c/d/i, no phlebitis. R hip incision w/ dressing in place, nontender, no erythema inspected, dry. nml ROM R hip  Neurological: She is alert and oriented to person, place, and time. No cranial nerve deficit. She exhibits normal muscle tone.  Skin: Skin is warm and dry. No rash noted. She is not diaphoretic.  Psychiatric: She has a normal mood and affect. Her behavior is normal. Judgment and thought content normal.    ED Course  Procedures (including critical care time)  Labs Reviewed  CBC WITH DIFFERENTIAL - Abnormal; Notable for the following:    RBC 3.47 (*)     Hemoglobin 9.6 (*)     HCT 28.1 (*)     Platelets 101 (*)  PLATELET COUNT CONFIRMED BY SMEAR   Eosinophils Relative 6 (*)     All other components within normal limits  BASIC METABOLIC PANEL - Abnormal; Notable for the following:    Sodium 121 (*)     Chloride 91 (*)     Glucose, Bld 107 (*)     Creatinine, Ser 1.20 (*)     GFR calc non Af Amer 43 (*)     GFR calc Af Amer 50 (*)     All other components within normal limits  URINALYSIS, ROUTINE W REFLEX MICROSCOPIC - Abnormal; Notable for the following:    APPearance CLOUDY (*)     Leukocytes, UA TRACE (*)     All other components within normal limits  URINE MICROSCOPIC-ADD ON  URINE CULTURE  CLOSTRIDIUM DIFFICILE BY PCR   Dg Chest 2  View  01/02/2012  *RADIOLOGY REPORT*  Clinical Data: Weakness, shortness of breath  CHEST - 2 VIEW  Comparison: 12/04/2011  Findings: Cardiomediastinal silhouette is stable.  No acute infiltrate or pleural effusion.  No pulmonary edema.  There is a right arm PICC line with tip in right atrium.  For distal SVC position the PICC line should be retracted about 2.5 cm. No diagnostic pneumothorax.  IMPRESSION:  No acute infiltrate or pleural effusion.  No pulmonary edema. There is a right arm PICC line with tip in right atrium.  For distal SVC position the PICC line should be retracted about 2.5 cm. No diagnostic pneumothorax.   Original Report Authenticated By: Natasha Mead, M.D.      1. Hyponatremia   2. Kidney insufficiency     Date: 01/02/2012  Rate: 80  Rhythm: normal sinus rhythm  QRS Axis: normal  Intervals: normal  ST/T Wave abnormalities: normal  Conduction Disutrbances: none  Narrative Interpretation: nml  Old EKG Reviewed: No significant changes noted      MDM  6:36 PM clinical picture concerning for a possible reaction to vancomycin since her symptoms started as soon as the infusion began 4 days ago. Will evaluate for other possible etiologies for her generalized symptoms including urine, basic labs, chest x-ray. Stable.  9:31 PM to be admitted for hypoNa and mild ARF. Stable. likely Dehydration vs vanc rxn vs combination      Warrick Parisian, MD 01/02/12 2133

## 2012-01-02 NOTE — ED Notes (Signed)
Checked with IV team and was told that I may access PICC line for ordered meds

## 2012-01-02 NOTE — ED Notes (Signed)
Pt is being taken to radiology 

## 2012-01-03 ENCOUNTER — Observation Stay (HOSPITAL_COMMUNITY): Payer: Medicaid Other

## 2012-01-03 LAB — BASIC METABOLIC PANEL
CO2: 21 mEq/L (ref 19–32)
Chloride: 94 mEq/L — ABNORMAL LOW (ref 96–112)
Glucose, Bld: 143 mg/dL — ABNORMAL HIGH (ref 70–99)
Potassium: 4.4 mEq/L (ref 3.5–5.1)
Sodium: 125 mEq/L — ABNORMAL LOW (ref 135–145)

## 2012-01-03 LAB — OSMOLALITY, URINE: Osmolality, Ur: 227 mOsm/kg — ABNORMAL LOW (ref 390–1090)

## 2012-01-03 LAB — CREATININE, SERUM: Creatinine, Ser: 1.21 mg/dL — ABNORMAL HIGH (ref 0.50–1.10)

## 2012-01-03 LAB — COMPREHENSIVE METABOLIC PANEL
ALT: 10 U/L (ref 0–35)
Albumin: 2.4 g/dL — ABNORMAL LOW (ref 3.5–5.2)
Alkaline Phosphatase: 62 U/L (ref 39–117)
BUN: 11 mg/dL (ref 6–23)
Potassium: 4.6 mEq/L (ref 3.5–5.1)
Sodium: 126 mEq/L — ABNORMAL LOW (ref 135–145)
Total Protein: 5.5 g/dL — ABNORMAL LOW (ref 6.0–8.3)

## 2012-01-03 LAB — CBC
HCT: 26 % — ABNORMAL LOW (ref 36.0–46.0)
Hemoglobin: 8.8 g/dL — ABNORMAL LOW (ref 12.0–15.0)
MCH: 27.2 pg (ref 26.0–34.0)
MCHC: 33.5 g/dL (ref 30.0–36.0)
MCV: 80.5 fL (ref 78.0–100.0)
RDW: 14.7 % (ref 11.5–15.5)
WBC: 5.7 10*3/uL (ref 4.0–10.5)

## 2012-01-03 LAB — GLUCOSE, CAPILLARY
Glucose-Capillary: 126 mg/dL — ABNORMAL HIGH (ref 70–99)
Glucose-Capillary: 148 mg/dL — ABNORMAL HIGH (ref 70–99)
Glucose-Capillary: 151 mg/dL — ABNORMAL HIGH (ref 70–99)
Glucose-Capillary: 286 mg/dL — ABNORMAL HIGH (ref 70–99)

## 2012-01-03 LAB — TROPONIN I
Troponin I: <0.3 ng/mL (ref <0.30)
Troponin I: <0.3 ng/mL (ref <0.30)

## 2012-01-03 LAB — CREATININE, URINE, RANDOM: Creatinine, Urine: 30.42 mg/dL

## 2012-01-03 LAB — PHOSPHORUS: Phosphorus: 2.5 mg/dL (ref 2.3–4.6)

## 2012-01-03 LAB — SODIUM, URINE, RANDOM
Sodium, Ur: 60 mEq/L
Sodium, Ur: 55 mEq/L

## 2012-01-03 MED ORDER — DOCUSATE SODIUM 100 MG PO CAPS
100.0000 mg | ORAL_CAPSULE | Freq: Two times a day (BID) | ORAL | Status: DC
  Administered 2012-01-03: 100 mg via ORAL
  Filled 2012-01-03 (×3): qty 1

## 2012-01-03 MED ORDER — ASPIRIN EC 325 MG PO TBEC
325.0000 mg | DELAYED_RELEASE_TABLET | Freq: Every day | ORAL | Status: DC
  Administered 2012-01-03: 325 mg via ORAL
  Filled 2012-01-03: qty 1

## 2012-01-03 MED ORDER — ASPIRIN EC 81 MG PO TBEC
81.0000 mg | DELAYED_RELEASE_TABLET | Freq: Every day | ORAL | Status: DC
  Filled 2012-01-03: qty 1

## 2012-01-03 MED ORDER — METHOCARBAMOL 500 MG PO TABS
500.0000 mg | ORAL_TABLET | Freq: Four times a day (QID) | ORAL | Status: DC | PRN
  Filled 2012-01-03: qty 1

## 2012-01-03 MED ORDER — ALBUTEROL SULFATE (5 MG/ML) 0.5% IN NEBU: 2.5000 mg | INHALATION_SOLUTION | RESPIRATORY_TRACT | Status: DC | PRN

## 2012-01-03 MED ORDER — ACETAMINOPHEN 650 MG RE SUPP: 650.0000 mg | Freq: Four times a day (QID) | RECTAL | Status: DC | PRN

## 2012-01-03 MED ORDER — SIMVASTATIN 20 MG PO TABS
20.0000 mg | ORAL_TABLET | Freq: Every day | ORAL | Status: DC
  Administered 2012-01-03: 20 mg via ORAL
  Filled 2012-01-03 (×2): qty 1

## 2012-01-03 MED ORDER — FUROSEMIDE 20 MG PO TABS
20.0000 mg | ORAL_TABLET | Freq: Every day | ORAL | Status: DC
  Administered 2012-01-03: 20 mg via ORAL
  Filled 2012-01-03 (×2): qty 1

## 2012-01-03 MED ORDER — IPRATROPIUM BROMIDE 0.02 % IN SOLN
0.5000 mg | Freq: Four times a day (QID) | RESPIRATORY_TRACT | Status: DC
  Administered 2012-01-03: 0.5 mg via RESPIRATORY_TRACT
  Filled 2012-01-03: qty 2.5

## 2012-01-03 MED ORDER — ONDANSETRON HCL 4 MG PO TABS: 4.0000 mg | ORAL_TABLET | Freq: Four times a day (QID) | ORAL | Status: DC | PRN

## 2012-01-03 MED ORDER — HYDROCODONE-ACETAMINOPHEN 5-325 MG PO TABS: 1.0000 | ORAL_TABLET | ORAL | Status: DC | PRN

## 2012-01-03 MED ORDER — MORPHINE SULFATE 2 MG/ML IJ SOLN: 2.0000 mg | INTRAMUSCULAR | Status: DC | PRN

## 2012-01-03 MED ORDER — SODIUM CHLORIDE 0.9 % IJ SOLN
3.0000 mL | Freq: Two times a day (BID) | INTRAMUSCULAR | Status: DC
  Administered 2012-01-03 (×2): 3 mL via INTRAVENOUS

## 2012-01-03 MED ORDER — OXYCODONE-ACETAMINOPHEN 5-325 MG PO TABS: 1.0000 | ORAL_TABLET | ORAL | Status: DC | PRN

## 2012-01-03 MED ORDER — ACETAMINOPHEN 500 MG PO TABS
1000.0000 mg | ORAL_TABLET | Freq: Three times a day (TID) | ORAL | Status: DC
Start: 2012-01-03 — End: 2012-01-04
  Administered 2012-01-03 (×2): 1000 mg via ORAL
  Filled 2012-01-03 (×6): qty 2

## 2012-01-03 MED ORDER — SODIUM CHLORIDE 0.9 % IJ SOLN
10.0000 mL | INTRAMUSCULAR | Status: DC | PRN
  Administered 2012-01-03 – 2012-01-08 (×5): 10 mL

## 2012-01-03 MED ORDER — POTASSIUM CHLORIDE CRYS ER 20 MEQ PO TBCR: 40.0000 meq | EXTENDED_RELEASE_TABLET | Freq: Once | ORAL | Status: DC

## 2012-01-03 MED ORDER — SACCHAROMYCES BOULARDII 250 MG PO CAPS
250.0000 mg | ORAL_CAPSULE | Freq: Two times a day (BID) | ORAL | Status: DC
  Administered 2012-01-03 – 2012-01-04 (×2): 250 mg via ORAL
  Filled 2012-01-03 (×3): qty 1

## 2012-01-03 MED ORDER — INSULIN ASPART 100 UNIT/ML ~~LOC~~ SOLN
0.0000 [IU] | Freq: Every day | SUBCUTANEOUS | Status: DC
  Administered 2012-01-05 – 2012-01-06 (×2): 2 [IU] via SUBCUTANEOUS

## 2012-01-03 MED ORDER — INSULIN ASPART 100 UNIT/ML ~~LOC~~ SOLN
0.0000 [IU] | Freq: Three times a day (TID) | SUBCUTANEOUS | Status: DC
  Administered 2012-01-03: 1 [IU] via SUBCUTANEOUS
  Administered 2012-01-03: 2 [IU] via SUBCUTANEOUS
  Administered 2012-01-03: 5 [IU] via SUBCUTANEOUS
  Administered 2012-01-04: 2 [IU] via SUBCUTANEOUS
  Administered 2012-01-04: 3 [IU] via SUBCUTANEOUS
  Administered 2012-01-04: 5 [IU] via SUBCUTANEOUS
  Administered 2012-01-05: 2 [IU] via SUBCUTANEOUS
  Administered 2012-01-05 (×2): 3 [IU] via SUBCUTANEOUS

## 2012-01-03 MED ORDER — IOHEXOL 350 MG/ML SOLN
100.0000 mL | Freq: Once | INTRAVENOUS | Status: AC | PRN
  Administered 2012-01-03: 100 mL via INTRAVENOUS

## 2012-01-03 MED ORDER — ONDANSETRON HCL 4 MG/2ML IJ SOLN: 4.0000 mg | Freq: Four times a day (QID) | INTRAMUSCULAR | Status: DC | PRN

## 2012-01-03 MED ORDER — ENOXAPARIN SODIUM 40 MG/0.4ML ~~LOC~~ SOLN
40.0000 mg | SUBCUTANEOUS | Status: DC
  Administered 2012-01-03: 40 mg via SUBCUTANEOUS
  Filled 2012-01-03: qty 0.4

## 2012-01-03 MED ORDER — RIFAMPIN 300 MG PO CAPS
300.0000 mg | ORAL_CAPSULE | Freq: Two times a day (BID) | ORAL | Status: DC
  Administered 2012-01-03 (×3): 300 mg via ORAL
  Filled 2012-01-03 (×5): qty 1

## 2012-01-03 MED ORDER — ACETAMINOPHEN 325 MG PO TABS: 650.0000 mg | ORAL_TABLET | Freq: Four times a day (QID) | ORAL | Status: DC | PRN

## 2012-01-03 MED ORDER — SODIUM CHLORIDE 0.9 % IV SOLN
INTRAVENOUS | Status: AC
  Administered 2012-01-03: 01:00:00 via INTRAVENOUS

## 2012-01-03 NOTE — Progress Notes (Signed)
Physical Therapy Evaluation Patient Details Name: Joyce Gilmore MRN: [id number] DOB: 10/07/1934 Today's Date: 01/03/2012 Time: 4098-1191 PT Time Calculation (min): 20 min  PT Assessment / Plan / Recommendation Clinical Impression  77 yo F admitted with N/V after recent TH revision and subsequent I&D to hip incision for MRSA.  Presents to PT with some deficits in mobility related to recent surgeries.  Will benefit from PT within the hospital to continue to progress mobility and exercises.      PT Assessment  Patient needs continued PT services    Follow Up Recommendations  Home health PT;Other (comment) (was receiving HHPT 3x week prior to admission per pt)    Does the patient have the potential to tolerate intense rehabilitation      Barriers to Discharge None      Equipment Recommendations  None recommended by PT    Recommendations for Other Services     Frequency Min 3X/week    Precautions / Restrictions Precautions Precautions: Anterior Hip Restrictions Weight Bearing Restrictions: Yes RLE Weight Bearing: Weight bearing as tolerated   Pertinent Vitals/Pain Pt denies pain      Mobility  Bed Mobility Bed Mobility: Supine to Sit Supine to Sit: 6: Modified independent (Device/Increase time);With rails Sitting - Scoot to Edge of Bed: 5: Supervision Details for Bed Mobility Assistance: No cues needed.  Patient used appropriate technique Transfers Transfers: Sit to Stand;Stand to Sit Sit to Stand: 5: Supervision;From bed Stand to Sit: 5: Supervision;With upper extremity assist;To bed Details for Transfer Assistance: Supervision for safety Ambulation/Gait Ambulation/Gait Assistance: 5: Supervision General Gait Details: Pt observed walking with NT on unit.  Pt ambulated with RW about 150 with supervision.            Exercises Total Joint Exercises Short Arc Quad: AROM;Right;15 reps;Supine Heel Slides: Strengthening;Right;10 reps;AROM Marching in  Standing: AROM;10 reps;Both;Standing   PT Diagnosis: Difficulty walking  PT Problem List: Decreased strength;Decreased mobility PT Treatment Interventions: DME instruction;Gait training;Therapeutic exercise;Patient/family education     Pt's daughter (not the one she lives with) present and translated during session  PT Goals Acute Rehab PT Goals PT Goal Formulation: With patient/family Time For Goal Achievement: 01/10/12 Potential to Achieve Goals: Good Pt will go Sit to Stand: with modified independence;without upper extremity assist PT Goal: Sit to Stand - Progress: Goal set today Pt will Ambulate: >150 feet;with modified independence;with rolling walker PT Goal: Ambulate - Progress: Goal set today  Visit Information  Last PT Received On: 01/03/12 Assistance Needed: +1    Subjective Data  Patient Stated Goal: to return home   Prior Functioning  Home Living Lives With: Family Available Help at Discharge: Family;Available PRN/intermittently Type of Home: House Home Access: Stairs to enter [employer] of Steps: 3 Entrance Stairs-Rails: Can reach both;Right;Left Home Layout: One level Bathroom Shower/Tub: Forensic scientist: Standard Home Adaptive Equipment: Walker - rolling;Bedside commode/3-in-1 Prior Function Level of Independence: Independent with assistive device(s) Able to Take Stairs?: Yes Driving: No Vocation: Retired Musician: Prefers language other than English;Other (comment) (Spanish) Dominant Hand: Right    Cognition  Overall Cognitive Status: Appears within functional limits for tasks assessed/performed Arousal/Alertness: Awake/alert Orientation Level: Appears intact for tasks assessed Behavior During Session: [facility] for tasks performed    Extremity/Trunk Assessment Right Upper Extremity Assessment RUE ROM/Strength/Tone: Within functional levels Left Upper Extremity Assessment LUE ROM/Strength/Tone:  Within functional levels Right Lower Extremity Assessment RLE ROM/Strength/Tone: Deficits RLE ROM/Strength/Tone Deficits: Some functional weakness secondary to recent surgery Left Lower Extremity  Assessment LLE ROM/Strength/Tone: WFL for tasks assessed   Balance    End of Session PT - End of Session Activity Tolerance: Patient tolerated treatment well Patient left: in bed;with family/visitor present Nurse Communication: Mobility status  GP Functional Assessment Tool Used: clinical judgement Functional Limitation: Mobility: Walking and moving around Mobility: Walking and Moving Around Current Status (G9562): At least 20 percent but less than 40 percent impaired, limited or restricted Mobility: Walking and Moving Around Goal Status [phone]): At least 1 percent but less than 20 percent impaired, limited or restricted   Donnella Sham 01/03/2012, 3:50 PM Lavona Mound, PT  [phone] 01/03/2012

## 2012-01-03 NOTE — Progress Notes (Signed)
ANTIBIOTIC CONSULT NOTE - INITIAL  Pharmacy Consult for vancomycin Indication: Infected hip implant  No Known Allergies   Vital Signs: Temp: 99.3 F (37.4 C) (11/09 0032) Temp src: Oral (11/09 0032) BP: 128/50 mmHg (11/09 0032) Pulse Rate: 86  (11/09 0032)  Labs:  Basename 01/02/12 1836  WBC 7.0  HGB 9.6*  PLT 101*  LABCREA --  CREATININE 1.20*    Microbiology: Recent Results (from the past 720 hour(s))  SURGICAL PCR SCREEN     Status: Abnormal   Collection Time   12/04/11  1:56 PM      Component Value Range Status Comment   MRSA, PCR NEGATIVE  NEGATIVE Final    Staphylococcus aureus POSITIVE (*) NEGATIVE Final   URINE CULTURE     Status: Normal   Collection Time   12/22/11  3:12 PM      Component Value Range Status Comment   Specimen Description URINE, RANDOM   Final    Special Requests NONE   Final    Culture  Setup Time 12/22/2011 17:28   Final    Colony Count >=100,000 COLONIES/ML   Final    Culture ESCHERICHIA COLI   Final    Report Status 12/24/2011 FINAL   Final    Organism ID, Bacteria ESCHERICHIA COLI   Final   SURGICAL PCR SCREEN     Status: Normal   Collection Time   12/22/11  4:25 PM      Component Value Range Status Comment   MRSA, PCR NEGATIVE  NEGATIVE Final    Staphylococcus aureus NEGATIVE  NEGATIVE Final   CULTURE, ROUTINE-ABSCESS     Status: Normal   Collection Time   12/22/11  6:03 PM      Component Value Range Status Comment   Specimen Description ABSCESS HIP RIGHT   Final    Special Requests SUPERFICIAL   Final    Gram Stain     Final    Value: FEW WBC PRESENT,BOTH PMN AND MONONUCLEAR     RARE GRAM POSITIVE COCCI     IN PAIRS Performed at [facility]   Culture     Final    Value: MODERATE METHICILLIN RESISTANT STAPHYLOCOCCUS AUREUS     Note: RIFAMPIN AND GENTAMICIN SHOULD NOT BE USED AS SINGLE DRUGS FOR TREATMENT OF STAPH INFECTIONS. CRITICAL RESULT CALLED TO, READ BACK BY AND VERIFIED WITH: CAROL S@1018  ON [id number] BY [facility]   Report Status 12/25/2011 FINAL   Final    Organism ID, Bacteria METHICILLIN RESISTANT STAPHYLOCOCCUS AUREUS   Final   ANAEROBIC CULTURE     Status: Normal   Collection Time   12/22/11  6:03 PM      Component Value Range Status Comment   Specimen Description ABSCESS HIP RIGHT   Final    Special Requests SUPERFICIAL   Final    Gram Stain     Final    Value: FEW WBC PRESENT,BOTH PMN AND MONONUCLEAR     RARE GRAM POSITIVE COCCI     IN PAIRS Performed at [facility]   Culture NO ANAEROBES ISOLATED   Final    Report Status 12/27/2011 FINAL   Final   GRAM STAIN     Status: Normal   Collection Time   12/22/11  6:03 PM      Component Value Range Status Comment   Specimen Description ABSCESS HIP RIGHT   Final    Special Requests SUPERFICIAL   Final    Gram Stain  Final    Value: FEW WBC PRESENT,BOTH PMN AND MONONUCLEAR     RARE GRAM POSITIVE COCCI IN PAIRS   Report Status 12/22/2011 FINAL   Final   CULTURE, ROUTINE-ABSCESS     Status: Normal   Collection Time   12/22/11  6:20 PM      Component Value Range Status Comment   Specimen Description ABSCESS HIP RIGHT   Final    Special Requests DEEP   Final    Gram Stain     Final    Value: RARE WBC PRESENT,BOTH PMN AND MONONUCLEAR     NO ORGANISMS SEEN     Performed at [facility]   Culture     Final    Value: MODERATE METHICILLIN RESISTANT STAPHYLOCOCCUS AUREUS     Note: RIFAMPIN AND GENTAMICIN SHOULD NOT BE USED AS SINGLE DRUGS FOR TREATMENT OF STAPH INFECTIONS. CRITICAL RESULT CALLED TO, READ BACK BY AND VERIFIED WITH: CAROL S@1018  ON [id number] BY [facility]   Report Status 12/25/2011 FINAL   Final    Organism ID, Bacteria METHICILLIN RESISTANT STAPHYLOCOCCUS AUREUS   Final   ANAEROBIC CULTURE     Status: Normal   Collection Time   12/22/11  6:20 PM      Component Value Range Status Comment   Specimen Description ABSCESS HIP RIGHT   Final    Special Requests DEEP   Final    Gram Stain     Final    Value: RARE WBC  PRESENT,BOTH PMN AND MONONUCLEAR     NO ORGANISMS SEEN     Performed at [facility]   Culture NO ANAEROBES ISOLATED   Final    Report Status 12/27/2011 FINAL   Final   GRAM STAIN     Status: Normal   Collection Time   12/22/11  6:20 PM      Component Value Range Status Comment   Specimen Description ABSCESS HIP RIGHT   Final    Special Requests DEEP   Final    Gram Stain     Final    Value: RARE WBC PRESENT,BOTH PMN AND MONONUCLEAR     NO ORGANISMS SEEN   Report Status 12/22/2011 FINAL   Final     Medical History: Past Medical History  Diagnosis Date  . Hypertension   . Depression     hx of   . Diabetes mellitus without complication   . Chronic kidney disease     hx of kidney stones,   . Arthritis     Medications:  Prescriptions prior to admission  Medication Sig Dispense Refill  . aspirin EC 325 MG EC tablet Take 1 tablet (325 mg total) by mouth daily.  30 tablet  0  . calcium-vitamin D (OSCAL WITH D) 500-200 MG-UNIT per tablet Take 1 tablet by mouth daily.      . Cholecalciferol (VITAMIN D-400 PO) Take 400 Units by mouth daily.      Marland Kitchen glipiZIDE (GLUCOTROL XL) 5 MG 24 hr tablet Take 5 mg by mouth daily.      Marland Kitchen lisinopril (PRINIVIL,ZESTRIL) 40 MG tablet Take 40 mg by mouth every morning.       . metFORMIN (GLUCOPHAGE) 1000 MG tablet Take 1,000 mg by mouth 2 (two) times daily with a meal.      . methocarbamol (ROBAXIN) 500 MG tablet Take 1 tablet (500 mg total) by mouth every 6 (six) hours as needed.  60 tablet  0  . ondansetron (ZOFRAN) 4 MG tablet Take  4 mg by mouth every 8 (eight) hours as needed. For nausea      . oxyCODONE-acetaminophen (PERCOCET/ROXICET) 5-325 MG per tablet Take 1 tablet by mouth every 8 (eight) hours as needed. For pain      . pravastatin (PRAVACHOL) 40 MG tablet Take 40 mg by mouth at bedtime.       . rifampin (RIFADIN) 300 MG capsule Take 1 capsule (300 mg total) by mouth every 12 (twelve) hours.  90 capsule  1  . sitaGLIPtin (JANUVIA) 100  MG tablet Take 100 mg by mouth every morning.       . sodium chloride 0.9 % SOLN 500 mL with vancomycin 1000 MG SOLR 1,500 mg Inject 1,500 mg into the vein daily.  4000 mL  1   Scheduled:    . aspirin EC  325 mg Oral Daily  . docusate sodium  100 mg Oral BID  . enoxaparin (LOVENOX) injection  40 mg Subcutaneous Q24H  . insulin aspart  0-5 Units Subcutaneous QHS  . insulin aspart  0-9 Units Subcutaneous TID WC  . ipratropium  0.5 mg Nebulization Q6H  . [COMPLETED] ondansetron (ZOFRAN) IV  4 mg Intravenous Once  . rifampin  300 mg Oral Q12H  . simvastatin  20 mg Oral q1800  . [COMPLETED] sodium chloride  1,000 mL Intravenous Once  . sodium chloride  3 mL Intravenous Q12H    Assessment: 77yo female was recently discharged with PICC for 6 weeks of ABX for MRSA infection of hip s/p right hip revision arthroplasty, now c/o generalized weakness, HA, N/V/D, and decreased PO intake, found to be hyponatremic with mild ARF, thought to be due to dehydration vs vanc rxn vs combo; to continue vanc; per pt's family last dose of vanc was 11/8 at 10am.  Goal of Therapy:  Vancomycin trough level 15-20 mcg/ml  Plan:  Will obtain vanc trough this am prior to resuming home dose of vancomycin 1500mg  IV Q24H.  Colleen Can PharmD BCPS 01/03/2012,1:29 AM

## 2012-01-03 NOTE — Progress Notes (Signed)
ANTIBIOTIC CONSULT NOTE - F/U  Pharmacy Consult for vancomycin Indication: Infected hip implant  No Known Allergies   Vital Signs: Temp: 99.1 F (37.3 C) (11/09 0500) Temp src: Oral (11/09 0500) BP: 113/46 mmHg (11/09 0500) Pulse Rate: 83  (11/09 0500)  Labs:  Basename 01/03/12 1120 01/03/12 0645 01/03/12 0532 01/03/12 0002 01/02/12 1836  WBC -- -- 5.7 5.6 7.0  HGB -- -- 8.6* 8.8* 9.6*  PLT -- -- 66* 76* 101*  LABCREA -- 30.42 -- -- --  CREATININE 1.31* -- 1.21* 1.21* --    Microbiology: Recent Results (from the past 720 hour(s))  SURGICAL PCR SCREEN     Status: Abnormal   Collection Time   12/04/11  1:56 PM      Component Value Range Status Comment   MRSA, PCR NEGATIVE  NEGATIVE Final    Staphylococcus aureus POSITIVE (*) NEGATIVE Final   URINE CULTURE     Status: Normal   Collection Time   12/22/11  3:12 PM      Component Value Range Status Comment   Specimen Description URINE, RANDOM   Final    Special Requests NONE   Final    Culture  Setup Time 12/22/2011 17:28   Final    Colony Count >=100,000 COLONIES/ML   Final    Culture ESCHERICHIA COLI   Final    Report Status 12/24/2011 FINAL   Final    Organism ID, Bacteria ESCHERICHIA COLI   Final   SURGICAL PCR SCREEN     Status: Normal   Collection Time   12/22/11  4:25 PM      Component Value Range Status Comment   MRSA, PCR NEGATIVE  NEGATIVE Final    Staphylococcus aureus NEGATIVE  NEGATIVE Final   CULTURE, ROUTINE-ABSCESS     Status: Normal   Collection Time   12/22/11  6:03 PM      Component Value Range Status Comment   Specimen Description ABSCESS HIP RIGHT   Final    Special Requests SUPERFICIAL   Final    Gram Stain     Final    Value: FEW WBC PRESENT,BOTH PMN AND MONONUCLEAR     RARE GRAM POSITIVE COCCI     IN PAIRS Performed at [facility]   Culture     Final    Value: MODERATE METHICILLIN RESISTANT STAPHYLOCOCCUS AUREUS     Note: RIFAMPIN AND GENTAMICIN SHOULD NOT BE USED AS SINGLE DRUGS  FOR TREATMENT OF STAPH INFECTIONS. CRITICAL RESULT CALLED TO, READ BACK BY AND VERIFIED WITH: CAROL S@1018  ON [id number] BY [facility]   Report Status 12/25/2011 FINAL   Final    Organism ID, Bacteria METHICILLIN RESISTANT STAPHYLOCOCCUS AUREUS   Final   ANAEROBIC CULTURE     Status: Normal   Collection Time   12/22/11  6:03 PM      Component Value Range Status Comment   Specimen Description ABSCESS HIP RIGHT   Final    Special Requests SUPERFICIAL   Final    Gram Stain     Final    Value: FEW WBC PRESENT,BOTH PMN AND MONONUCLEAR     RARE GRAM POSITIVE COCCI     IN PAIRS Performed at [facility]   Culture NO ANAEROBES ISOLATED   Final    Report Status 12/27/2011 FINAL   Final   GRAM STAIN     Status: Normal   Collection Time   12/22/11  6:03 PM      Component Value Range Status  Comment   Specimen Description ABSCESS HIP RIGHT   Final    Special Requests SUPERFICIAL   Final    Gram Stain     Final    Value: FEW WBC PRESENT,BOTH PMN AND MONONUCLEAR     RARE GRAM POSITIVE COCCI IN PAIRS   Report Status 12/22/2011 FINAL   Final   CULTURE, ROUTINE-ABSCESS     Status: Normal   Collection Time   12/22/11  6:20 PM      Component Value Range Status Comment   Specimen Description ABSCESS HIP RIGHT   Final    Special Requests DEEP   Final    Gram Stain     Final    Value: RARE WBC PRESENT,BOTH PMN AND MONONUCLEAR     NO ORGANISMS SEEN     Performed at [facility]   Culture     Final    Value: MODERATE METHICILLIN RESISTANT STAPHYLOCOCCUS AUREUS     Note: RIFAMPIN AND GENTAMICIN SHOULD NOT BE USED AS SINGLE DRUGS FOR TREATMENT OF STAPH INFECTIONS. CRITICAL RESULT CALLED TO, READ BACK BY AND VERIFIED WITH: CAROL S@1018  ON [id number] BY [facility]   Report Status 12/25/2011 FINAL   Final    Organism ID, Bacteria METHICILLIN RESISTANT STAPHYLOCOCCUS AUREUS   Final   ANAEROBIC CULTURE     Status: Normal   Collection Time   12/22/11  6:20 PM      Component Value Range Status Comment    Specimen Description ABSCESS HIP RIGHT   Final    Special Requests DEEP   Final    Gram Stain     Final    Value: RARE WBC PRESENT,BOTH PMN AND MONONUCLEAR     NO ORGANISMS SEEN     Performed at [facility]   Culture NO ANAEROBES ISOLATED   Final    Report Status 12/27/2011 FINAL   Final   GRAM STAIN     Status: Normal   Collection Time   12/22/11  6:20 PM      Component Value Range Status Comment   Specimen Description ABSCESS HIP RIGHT   Final    Special Requests DEEP   Final    Gram Stain     Final    Value: RARE WBC PRESENT,BOTH PMN AND MONONUCLEAR     NO ORGANISMS SEEN   Report Status 12/22/2011 FINAL   Final     Medical History: Past Medical History  Diagnosis Date  . Hypertension   . Depression     hx of   . Diabetes mellitus without complication   . Chronic kidney disease     hx of kidney stones,   . Arthritis     Medications:  Prescriptions prior to admission  Medication Sig Dispense Refill  . aspirin EC 325 MG EC tablet Take 1 tablet (325 mg total) by mouth daily.  30 tablet  0  . calcium-vitamin D (OSCAL WITH D) 500-200 MG-UNIT per tablet Take 1 tablet by mouth daily.      . Cholecalciferol (VITAMIN D-400 PO) Take 400 Units by mouth daily.      Marland Kitchen glipiZIDE (GLUCOTROL XL) 5 MG 24 hr tablet Take 5 mg by mouth daily.      Marland Kitchen lisinopril (PRINIVIL,ZESTRIL) 40 MG tablet Take 40 mg by mouth every morning.       . metFORMIN (GLUCOPHAGE) 1000 MG tablet Take 1,000 mg by mouth 2 (two) times daily with a meal.      . methocarbamol (ROBAXIN) 500  MG tablet Take 1 tablet (500 mg total) by mouth every 6 (six) hours as needed.  60 tablet  0  . ondansetron (ZOFRAN) 4 MG tablet Take 4 mg by mouth every 8 (eight) hours as needed. For nausea      . oxyCODONE-acetaminophen (PERCOCET/ROXICET) 5-325 MG per tablet Take 1 tablet by mouth every 8 (eight) hours as needed. For pain      . pravastatin (PRAVACHOL) 40 MG tablet Take 40 mg by mouth at bedtime.       . rifampin (RIFADIN)  300 MG capsule Take 1 capsule (300 mg total) by mouth every 12 (twelve) hours.  90 capsule  1  . sitaGLIPtin (JANUVIA) 100 MG tablet Take 100 mg by mouth every morning.       . sodium chloride 0.9 % SOLN 500 mL with vancomycin 1000 MG SOLR 1,500 mg Inject 1,500 mg into the vein daily.  4000 mL  1   Scheduled:     . acetaminophen  1,000 mg Oral TID  . aspirin EC  325 mg Oral Daily  . enoxaparin (LOVENOX) injection  40 mg Subcutaneous Q24H  . insulin aspart  0-5 Units Subcutaneous QHS  . insulin aspart  0-9 Units Subcutaneous TID WC  . [COMPLETED] ondansetron (ZOFRAN) IV  4 mg Intravenous Once  . rifampin  300 mg Oral Q12H  . simvastatin  20 mg Oral q1800  . [COMPLETED] sodium chloride  1,000 mL Intravenous Once  . sodium chloride  3 mL Intravenous Q12H  . [DISCONTINUED] docusate sodium  100 mg Oral BID  . 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  Vanc trough ordered for today at 09:30 but was not drawn until 2 hours later at 11:20am. This result still came back elevated at 37.4 (goal 15-20 mcg/ml), suggesting that the true trough was more than likely in the 40s. The patient has NOT received any vancomycin since being in the hospital this admission. Home regimen was 1500mg  IV q24.   Goal of Therapy:  Vancomycin trough level 15-20 mcg/ml  Plan:  1) No vancomycin today  2) Random vancomycin level in am   Thank you, [employer], Pharm.D. Clinical Pharmacist   01/03/2012 1:58 PM

## 2012-01-03 NOTE — Progress Notes (Signed)
PATIENT DETAILS Name: Joyce Gilmore Age: 77 y.o. Sex: female Date of Birth: [birth year] Admit Date: 01/02/2012 Admitting Physician Therisa Doyne, MD JXB:JYNWG,NFAO [name], PA-C  Subjective: No major issues overnight, anxious to go home  Assessment/Plan: Active Problems: Nausea/vomiting/diarrhea -Resolved  -Etiology not certain, given the fact that she has been on antibiotics-C. difficile colitis is a concern. Await  PCR.  Hyponatremia -Likely secondary to dehydration from above. Suspect some chronicity to the low sodium levels-? Chronic SIADH -Improved with hydration -Continue to monitor -Will begin fluid restriction, check serum and urine osmolality. TSH within normal limits  Chest pain -Resolved -Likely atypical -Cardiac enzymes negative, CT a chest negative for pulmonary embolism  Diabetes -CBGs controlled, continue with SSI -Resume oral hypoglycemics on discharge  Hypertension -BP controlled without the use of any antihypertensive medications  Dyslipidemia -Continue with statin  Anemia -Likely secondary to chronic disease -Monitor for now  Thrombocytopenia -? 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-status post irrigation and debridement -Continue with vancomycin and rifaximin -Vancomycin being dosed by pharmacy -If needed-we'll consult orthopedics and infectious disease  Disposition: Remain inpatient  DVT Prophylaxis: SCD's  Code Status: Full code or DNR  Procedures:  None  CONSULTS:  None  PHYSICAL EXAM: Vital signs in last 24 hours: Filed Vitals:   01/03/12 0002 01/03/12 0032 01/03/12 0238 01/03/12 0500  BP:  128/50  113/46  Pulse:  86  83  Temp:  99.3 F (37.4 C)  99.1 F (37.3 C)  TempSrc:  Oral  Oral  Resp:  16  24  Height:  5\' 2"  (1.575 m)    Weight:  67.6 kg (149 lb 0.5 oz)    SpO2: 98% 100% 98% 97%    Weight change:  Body mass index is 27.26 kg/(m^2).   Gen  Exam: Awake and alert with clear speech.   Neck: Supple, No JVD.   Chest: B/L Clear.   CVS: S1 S2 Regular, no murmurs.  Abdomen: soft, BS +, non tender, non distended.  Extremities: no edema, lower extremities warm to touch. Neurologic: Non Focal.   Skin: No Rash.   Wounds: N/A.    Intake/Output from previous day:  Intake/Output Summary (Last 24 hours) at 01/03/12 1434 Last data filed at 01/03/12 0647  Gross per 24 hour  Intake 458.75 ml  Output    350 ml  Net 108.75 ml     LAB RESULTS: CBC  Lab 01/03/12 0532 01/03/12 0002 01/02/12 1836  WBC 5.7 5.6 7.0  HGB 8.6* 8.8* 9.6*  HCT 26.0* 26.3* 28.1*  PLT 66* 76* 101*  MCV 80.5 81.4 81.0  MCH 26.6 27.2 27.7  MCHC 33.1 33.5 34.2  RDW 14.7 14.7 14.5  LYMPHSABS -- -- 1.0  MONOABS -- -- 0.7  EOSABS -- -- 0.4  BASOSABS -- -- 0.0  BANDABS -- -- --    Chemistries   Lab 01/03/12 1120 01/03/12 0532 01/03/12 0002 01/02/12 1836  NA 125* 126* -- 121*  K 4.4 4.6 -- 4.9  CL 94* 96 -- 91*  CO2 21 21 -- 20  GLUCOSE 143* 135* -- 107*  BUN 12 11 -- 12  CREATININE 1.31* 1.21* 1.21* 1.20*  CALCIUM 8.2* 8.1* -- 8.5  MG -- 1.3* -- --    CBG:  Lab 01/03/12 1228 01/03/12 0844 01/03/12 0028 12/29/11 1150 12/29/11 0746  GLUCAP 151* 131* 126* 221* 159*    GFR Estimated Creatinine Clearance: 33.4 ml/min (by C-G formula based on Cr of  1.31).  Coagulation profile No results found for this basename: INR:5,PROTIME:5 in the last 168 hours  Cardiac Enzymes  Lab 01/03/12 1120 01/03/12 0532 01/03/12 0002  CKMB -- -- --  TROPONINI <0.30 <0.30 <0.30  MYOGLOBIN -- -- --    No components found with this basename: POCBNP:3  Basename 01/02/12 2328  DDIMER 1.80*   No results found for this basename: HGBA1C:2 in the last 72 hours No results found for this basename: CHOL:2,HDL:2,LDLCALC:2,TRIG:2,CHOLHDL:2,LDLDIRECT:2 in the last 72 hours  Basename 01/03/12 0532  TSH 0.655  T4TOTAL --  T3FREE --  THYROIDAB --   No results found  for this basename: VITAMINB12:2,FOLATE:2,FERRITIN:2,TIBC:2,IRON:2,RETICCTPCT:2 in the last 72 hours No results found for this basename: LIPASE:2,AMYLASE:2 in the last 72 hours  Urine Studies No results found for this basename: UACOL:2,UAPR:2,USPG:2,UPH:2,UTP:2,UGL:2,UKET:2,UBIL:2,UHGB:2,UNIT:2,UROB:2,ULEU:2,UEPI:2,UWBC:2,URBC:2,UBAC:2,CAST:2,CRYS:2,UCOM:2,BILUA:2 in the last 72 hours  MICROBIOLOGY: No results found for this or any previous visit (from the past 240 hour(s)).  RADIOLOGY STUDIES/RESULTS: Dg Chest 2 View  01/02/2012  *RADIOLOGY REPORT*  Clinical Data: Weakness, shortness of breath  CHEST - 2 VIEW  Comparison: 12/04/2011  Findings: Cardiomediastinal silhouette is stable.  No acute infiltrate or pleural effusion.  No pulmonary edema.  There is a right arm PICC line with tip in right atrium.  For distal SVC position the PICC line should be retracted about 2.5 cm. No diagnostic pneumothorax.  IMPRESSION:  No acute infiltrate or pleural effusion.  No pulmonary edema. There is a right arm PICC line with tip in right atrium.  For distal SVC position the PICC line should be retracted about 2.5 cm. No diagnostic pneumothorax.   Original Report Authenticated By: Natasha Mead, M.D.    Dg Chest 2 View  12/04/2011  *RADIOLOGY REPORT*  Clinical Data: History of hypertension.  Preoperative evaluation prior to hip surgery.  CHEST - 2 VIEW  Comparison: Chest x-ray 05/16/2011.  Findings: Lung volumes are normal.  No consolidative airspace disease.  No pleural effusions.  No pneumothorax.  No pulmonary nodule or mass noted.  Pulmonary vasculature and the cardiomediastinal silhouette are within normal limits. Atherosclerotic calcifications are noted within the arch of the aorta.  IMPRESSION: 1. No radiographic evidence of acute cardiopulmonary disease. 2.  Atherosclerosis.   Original Report Authenticated By: Florencia Reasons, M.D.    Ct Angio Chest Pe W/cm &/or Wo Cm  01/03/2012  *RADIOLOGY REPORT*   Clinical Data: Chest pain, shortness of breath, neck pain and headache.  CT ANGIOGRAPHY CHEST  Technique:  Multidetector CT imaging of the chest using the standard protocol during bolus administration of intravenous contrast. Multiplanar reconstructed images including MIPs were obtained and reviewed to evaluate the vascular anatomy.  Contrast: OMNIPAQUE IOHEXOL 350 MG/ML SOLN  Comparison: Chest radiograph performed 01/02/2012  Findings: There is no evidence of pulmonary embolus.  Minimal bilateral atelectasis is noted.  There is no evidence of significant focal consolidation, pleural effusion or pneumothorax. No masses are identified; no abnormal focal contrast enhancement is seen.  The mediastinum is unremarkable in appearance.  No mediastinal lymphadenopathy is seen.  No pericardial effusion is identified. The great vessels are grossly unremarkable in appearance.  No axillary lymphadenopathy is seen.  The visualized portions of the thyroid gland are unremarkable in appearance.  The visualized portions of the liver and spleen are unremarkable.  No acute osseous abnormalities are seen.  IMPRESSION:  1.  No evidence of pulmonary embolus. 2.  Minimal bilateral atelectasis noted; lungs otherwise clear.   Original Report Authenticated By: Tonia Ghent, M.D.  Dg Pelvis Portable  12/05/2011  *RADIOLOGY REPORT*  Clinical Data: Right total hip arthroplasty.  Postoperative radiographs.  PORTABLE PELVIS  Comparison: 07/06/2010.  Findings: Revision right hip arthroplasty is present.  There is a new right total hip arthroplasty.  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  Right acetabular screw is now present.  The right hip is located. There is no periprosthetic fracture.  Expected postsurgical changes in the soft tissues.  IMPRESSION: Uncomplicated new revision right total hip arthroplasty.   Original Report Authenticated By: Andreas Newport, M.D.    Dg Hip Portable 1 View Right  12/05/2011  *RADIOLOGY  REPORT*  Clinical Data: Revision right total hip arthroplasty.  PORTABLE RIGHT HIP - 1 VIEW  Comparison: 07/06/2010.  Findings: New right total hip arthroplasty is present with acetabular cup screws.  The  Hip is located.  There is no periprosthetic fracture.  IMPRESSION: Uncomplicated new revision right total hip arthroplasty.   Original Report Authenticated By: Andreas Newport, M.D.     MEDICATIONS: Scheduled Meds:   . acetaminophen  1,000 mg Oral TID  . aspirin EC  325 mg Oral Daily  . insulin aspart  0-5 Units Subcutaneous QHS  . insulin aspart  0-9 Units Subcutaneous TID WC  . [COMPLETED] ondansetron (ZOFRAN) IV  4 mg Intravenous Once  . rifampin  300 mg Oral Q12H  . simvastatin  20 mg Oral q1800  . [COMPLETED] sodium chloride  1,000 mL Intravenous Once  . sodium chloride  3 mL Intravenous Q12H  . [DISCONTINUED] docusate sodium  100 mg Oral BID  . [DISCONTINUED] enoxaparin (LOVENOX) injection  40 mg Subcutaneous Q24H  . [DISCONTINUED] ipratropium  0.5 mg Nebulization Q6H   Continuous Infusions:   . [EXPIRED] sodium chloride 75 mL/hr at 01/03/12 0109   PRN Meds:.acetaminophen, acetaminophen, albuterol, HYDROcodone-acetaminophen, [COMPLETED] iohexol, methocarbamol, morphine injection, ondansetron (ZOFRAN) IV, ondansetron, oxyCODONE-acetaminophen, sodium chloride  Antibiotics: Anti-infectives     Start     Dose/Rate Route Frequency Ordered Stop   01/03/12 0100   rifampin (RIFADIN) capsule 300 mg        300 mg Oral Every 12 hours 01/03/12 0001             Jeoffrey Massed, MD  [facility] Pager:[phone]  If 7PM-7AM, please contact night-coverage [email or web address] Password [email or web address] 01/03/2012, 2:34 PM   LOS: 1 day

## 2012-01-03 NOTE — Progress Notes (Signed)
CRITICAL VALUE ALERT  Critical value received:  37.3 Vanc trough  Date of notification:  01/03/12  Time of notification:  1229  Critical value read back:yes  Nurse who received alert:  Charmayne Odell, RN  MD notified (1st page):  Dr. Jerral Ralph  Time of first page:  1240  MD notified (2nd page):  Time of second page:  Responding MD:    Time MD responded:

## 2012-01-04 DIAGNOSIS — D696 Thrombocytopenia, unspecified: Secondary | ICD-10-CM | POA: Diagnosis present

## 2012-01-04 DIAGNOSIS — T8450XA Infection and inflammatory reaction due to unspecified internal joint prosthesis, initial encounter: Secondary | ICD-10-CM

## 2012-01-04 DIAGNOSIS — Y831 Surgical operation with implant of artificial internal device as the cause of abnormal reaction of the patient, or of later complication, without mention of misadventure at the time of the procedure: Secondary | ICD-10-CM

## 2012-01-04 LAB — CBC
HCT: 24.6 % — ABNORMAL LOW (ref 36.0–46.0)
Hemoglobin: 8.4 g/dL — ABNORMAL LOW (ref 12.0–15.0)
Hemoglobin: 8.3 g/dL — ABNORMAL LOW (ref 12.0–15.0)
MCH: 27.1 pg (ref 26.0–34.0)
MCH: 26.9 pg (ref 26.0–34.0)
MCHC: 33.7 g/dL (ref 30.0–36.0)
Platelets: 12 10*3/uL — CL (ref 150–400)
Platelets: 10 10*3/uL — CL (ref 150–400)
RBC: 3.1 MIL/uL — ABNORMAL LOW (ref 3.87–5.11)
RBC: 3.12 MIL/uL — ABNORMAL LOW (ref 3.87–5.11)
RBC: 3.02 MIL/uL — ABNORMAL LOW (ref 3.87–5.11)
WBC: 3.8 10*3/uL — ABNORMAL LOW (ref 4.0–10.5)
WBC: 3.7 10*3/uL — ABNORMAL LOW (ref 4.0–10.5)

## 2012-01-04 LAB — SAVE SMEAR

## 2012-01-04 LAB — VANCOMYCIN, RANDOM: Vancomycin Rm: 25.2 ug/mL

## 2012-01-04 LAB — BASIC METABOLIC PANEL
Calcium: 7.9 mg/dL — ABNORMAL LOW (ref 8.4–10.5)
Creatinine, Ser: 1.2 mg/dL — ABNORMAL HIGH (ref 0.50–1.10)
GFR calc non Af Amer: 43 mL/min — ABNORMAL LOW (ref >90)
Sodium: 128 mEq/L — ABNORMAL LOW (ref 135–145)

## 2012-01-04 LAB — GLUCOSE, CAPILLARY
Glucose-Capillary: 214 mg/dL — ABNORMAL HIGH (ref 70–99)
Glucose-Capillary: 176 mg/dL — ABNORMAL HIGH (ref 70–99)
Glucose-Capillary: 263 mg/dL — ABNORMAL HIGH (ref 70–99)
Glucose-Capillary: 196 mg/dL — ABNORMAL HIGH (ref 70–99)

## 2012-01-04 LAB — OSMOLALITY: Osmolality: 271 mOsm/kg — ABNORMAL LOW (ref 275–300)

## 2012-01-04 LAB — URINE CULTURE

## 2012-01-04 LAB — PROTIME-INR: Prothrombin Time: 15.4 seconds — ABNORMAL HIGH (ref 11.6–15.2)

## 2012-01-04 LAB — OSMOLALITY, URINE: Osmolality, Ur: 262 mOsm/kg — ABNORMAL LOW (ref 390–1090)

## 2012-01-04 MED ORDER — DIPHENHYDRAMINE HCL 50 MG/ML IJ SOLN
25.0000 mg | Freq: Once | INTRAMUSCULAR | Status: AC
  Administered 2012-01-04: 25 mg via INTRAVENOUS
  Filled 2012-01-04: qty 1

## 2012-01-04 MED ORDER — SODIUM CHLORIDE 0.9 % IV SOLN
400.0000 mg | INTRAVENOUS | Status: DC
  Administered 2012-01-04 – 2012-01-06 (×3): 400 mg via INTRAVENOUS
  Filled 2012-01-04 (×3): qty 8

## 2012-01-04 MED ORDER — ACETAMINOPHEN 325 MG PO TABS: 650.0000 mg | ORAL_TABLET | Freq: Once | ORAL | Status: DC

## 2012-01-04 MED ORDER — METHYLPREDNISOLONE SODIUM SUCC 125 MG IJ SOLR
60.0000 mg | Freq: Every day | INTRAMUSCULAR | Status: DC
  Administered 2012-01-04 – 2012-01-07 (×4): 60 mg via INTRAVENOUS
  Filled 2012-01-04 (×4): qty 0.96

## 2012-01-04 MED ORDER — FUROSEMIDE 10 MG/ML IJ SOLN
20.0000 mg | Freq: Once | INTRAMUSCULAR | Status: AC
  Administered 2012-01-04: 20 mg via INTRAVENOUS
  Filled 2012-01-04: qty 2

## 2012-01-04 NOTE — Plan of Care (Signed)
Problem: Phase III Progression Outcomes Goal: IV/normal saline lock discontinued Outcome: Completed/Met Date Met:  01/04/12 Will have to go home with a PICC line

## 2012-01-04 NOTE — Progress Notes (Signed)
CRITICAL VALUE ALERT  Critical value received:  Platelet 12.0  Date of notification:  01/03/2012  Time of notification:  0854  Critical value read back:yes  Nurse who received alert:  Marcelyn Bruins  MD notified (1st page):  MD Ghimire was on floor visiting pt and was told the result. He is currently working on orders for the patient.

## 2012-01-04 NOTE — Progress Notes (Signed)
PATIENT DETAILS Name: Joyce Gilmore Age: 77 y.o. Sex: female Date of Birth: 06-25-1934 Admit Date: 01/02/2012 Admitting Physician Therisa Doyne, MD NGE:[id number],XLKG [name], PA-C  Subjective: Anxious to be discharged home. No nausea, vomiting or diarrhea.  Assessment/Plan: Active Problems:  Thrombocytopenia -severe -spoke with Dr Havery Moros reviewed her smear and labs, medications, history etc-no schistocytes on smear-no indication of TTP. Given the dramatic drop in over a few days-likely drug related-potential culprits are Vancomycin and Rifampin. 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-status post irrigation and debridement  -switched to Cubicin (see above) -appreciate ortho input   DIABETES MELLITUS, TYPE II -CBG's stable -c/w SSI  Hypertension  -BP controlled without the use of any antihypertensive medications   Dyslipidemia -stop statins-resume when Thrombocytopenia resolves/better  Anemia  -Likely secondary to chronic disease -no evidence of schistocytes on smear  Disposition: Remain inpatient  DVT Prophylaxis: SCD's  Code Status: Full code   Procedures:  None  CONSULTS:  ID-phone consult-Dr Daiva Eves  hematology/oncology-Dr Murinson [facility]-Dr Magnus Ivan  PHYSICAL EXAM: Vital signs in last 24 hours: Filed Vitals:   01/03/12 2205 01/04/12 0146 01/04/12 0700 01/04/12 1105  BP:  127/52 129/54 136/84  Pulse:  75 81 84  Temp:  98.2 F  (36.8 C) 98.5 F (36.9 C) 98.6 F (37 C)  TempSrc:  Oral Oral Oral  Resp:  20 20 20   Height:      Weight:      SpO2: 99% 98% 100%     Weight change:  Body mass index is 27.26 kg/(m^2).   Gen Exam: Awake and alert with clear speech.  Neck: Supple, No JVD.   Chest: B/L Clear.   CVS: S1 S2 Regular, no murmurs.  Abdomen: soft, BS +, non tender, non distended.  Extremities: no edema, lower extremities warm to touch. Neurologic: Non Focal.   Skin: No Rash.No petechiae or purpura   Wounds: N/A.    Intake/Output from previous day:  Intake/Output Summary (Last 24 hours) at 01/04/12 1417 Last data filed at 01/04/12 1344  Gross per 24 hour  Intake    360 ml  Output    750 ml  Net   -390 ml     LAB RESULTS: CBC  Lab 01/04/12 0950 01/04/12 0810 01/03/12 0532 01/03/12 0002 01/02/12 1836  WBC 3.7* 3.8* 5.7 5.6 7.0  HGB 8.4* 8.4* 8.6* 8.8* 9.6*  HCT 25.3* 25.2* 26.0* 26.3* 28.1*  PLT 10* 12* 66* 76* 101*  MCV 81.1 81.3 80.5 81.4 81.0  MCH 26.9 27.1 26.6 27.2 27.7  MCHC 33.2 33.3 33.1 33.5 34.2  RDW 14.8 15.0 14.7 14.7 14.5  LYMPHSABS -- -- -- -- 1.0  MONOABS -- -- -- -- 0.7  EOSABS -- -- -- -- 0.4  BASOSABS -- -- -- -- 0.0  BANDABS -- -- -- -- --    Chemistries   Lab 01/04/12 0635 01/03/12 1120 01/03/12 0532 01/03/12 0002 01/02/12 1836  NA 128* 125* 126* -- 121*  K 4.2 4.4 4.6 -- 4.9  CL 97 94*  96 -- 91*  CO2 21 21 21  -- 20  GLUCOSE 198* 143* 135* -- 107*  BUN 11 12 11  -- 12  CREATININE 1.20* 1.31* 1.21* 1.21* 1.20*  CALCIUM 7.9* 8.2* 8.1* -- 8.5  MG -- -- 1.3* -- --    CBG:  Lab 01/04/12 1200 01/04/12 0737 01/03/12 2057 01/03/12 1741 01/03/12 1228  GLUCAP 176* 214* 148* 286* 151*    GFR Estimated Creatinine Clearance: 36.5 ml/min (by C-G formula based on Cr of 1.2).  Coagulation profile  Lab 01/04/12 0950  INR 1.24  PROTIME --    Cardiac Enzymes  Lab 01/03/12 1120 01/03/12 0532 01/03/12 0002  CKMB -- -- --  TROPONINI <0.30 <0.30 <0.30    MYOGLOBIN -- -- --    No components found with this basename: POCBNP:3  Basename 01/02/12 2328  DDIMER 1.80*   No results found for this basename: HGBA1C:2 in the last 72 hours No results found for this basename: CHOL:2,HDL:2,LDLCALC:2,TRIG:2,CHOLHDL:2,LDLDIRECT:2 in the last 72 hours  Basename 01/03/12 0532  TSH 0.655  T4TOTAL --  T3FREE --  THYROIDAB --   No results found for this basename: VITAMINB12:2,FOLATE:2,FERRITIN:2,TIBC:2,IRON:2,RETICCTPCT:2 in the last 72 hours No results found for this basename: LIPASE:2,AMYLASE:2 in the last 72 hours  Urine Studies No results found for this basename: UACOL:2,UAPR:2,USPG:2,UPH:2,UTP:2,UGL:2,UKET:2,UBIL:2,UHGB:2,UNIT:2,UROB:2,ULEU:2,UEPI:2,UWBC:2,URBC:2,UBAC:2,CAST:2,CRYS:2,UCOM:2,BILUA:2 in the last 72 hours  MICROBIOLOGY: Recent Results (from the past 240 hour(s))  URINE CULTURE     Status: Normal   Collection Time   01/02/12  7:46 PM      Component Value Range Status Comment   Specimen Description URINE, CLEAN CATCH   Final    Special Requests NONE   Final    Culture  Setup Time 01/02/2012 20:44   Final    Colony Count 4,000 COLONIES/ML   Final    Culture INSIGNIFICANT GROWTH   Final    Report Status 01/04/2012 FINAL   Final   CLOSTRIDIUM DIFFICILE BY PCR     Status: Normal   Collection Time   01/03/12 11:27 AM      Component Value Range Status Comment   C difficile by pcr NEGATIVE  NEGATIVE Final     RADIOLOGY STUDIES/RESULTS: Dg Chest 2 View  01/02/2012  *RADIOLOGY REPORT*  Clinical Data: Weakness, shortness of breath  CHEST - 2 VIEW  Comparison: 12/04/2011  Findings: Cardiomediastinal silhouette is stable.  No acute infiltrate or pleural effusion.  No pulmonary edema.  There is a right arm PICC line with tip in right atrium.  For distal SVC position the PICC line should be retracted about 2.5 cm. No diagnostic pneumothorax.  IMPRESSION:  No acute infiltrate or pleural effusion.  No pulmonary edema. There is a right arm  PICC line with tip in right atrium.  For distal SVC position the PICC line should be retracted about 2.5 cm. No diagnostic pneumothorax.   Original Report Authenticated By: Natasha Mead, M.D.    Ct Angio Chest Pe W/cm &/or Wo Cm  01/03/2012  *RADIOLOGY REPORT*  Clinical Data: Chest pain, shortness of breath, neck pain and headache.  CT ANGIOGRAPHY CHEST  Technique:  Multidetector CT imaging of the chest using the standard protocol during bolus administration of intravenous contrast. Multiplanar reconstructed images including MIPs were obtained and reviewed to evaluate the vascular anatomy.  Contrast: OMNIPAQUE IOHEXOL 350 MG/ML SOLN  Comparison: Chest radiograph performed 01/02/2012  Findings: There is no evidence of pulmonary embolus.  Minimal bilateral atelectasis is noted.  There is no evidence of significant focal consolidation, pleural  effusion or pneumothorax. No masses are identified; no abnormal focal contrast enhancement is seen.  The mediastinum is unremarkable in appearance.  No mediastinal lymphadenopathy is seen.  No pericardial effusion is identified. The great vessels are grossly unremarkable in appearance.  No axillary lymphadenopathy is seen.  The visualized portions of the thyroid gland are unremarkable in appearance.  The visualized portions of the liver and spleen are unremarkable.  No acute osseous abnormalities are seen.  IMPRESSION:  1.  No evidence of pulmonary embolus. 2.  Minimal bilateral atelectasis noted; lungs otherwise clear.   Original Report Authenticated By: Tonia Ghent, M.D.     MEDICATIONS: Scheduled Meds:   . DAPTOmycin (CUBICIN)  IV  400 mg Intravenous Q24H  . [COMPLETED] diphenhydrAMINE  25 mg Intravenous Once  . [COMPLETED] furosemide  20 mg Intravenous Once  . insulin aspart  0-5 Units Subcutaneous QHS  . insulin aspart  0-9 Units Subcutaneous TID WC  . methylPREDNISolone (SOLU-MEDROL) injection  60 mg Intravenous Daily  . saccharomyces boulardii  250 mg  Oral BID  . sodium chloride  3 mL Intravenous Q12H  . [DISCONTINUED] acetaminophen  1,000 mg Oral TID  . [DISCONTINUED] acetaminophen  650 mg Oral Once  . [DISCONTINUED] aspirin EC  325 mg Oral Daily  . [DISCONTINUED] aspirin EC  81 mg Oral Daily  . [DISCONTINUED] enoxaparin (LOVENOX) injection  40 mg Subcutaneous Q24H  . [DISCONTINUED] furosemide  20 mg Oral Daily  . [DISCONTINUED] potassium chloride  40 mEq Oral Once  . [DISCONTINUED] rifampin  300 mg Oral Q12H  . [DISCONTINUED] simvastatin  20 mg Oral q1800   Continuous Infusions:  PRN Meds:.acetaminophen, acetaminophen, albuterol, HYDROcodone-acetaminophen, methocarbamol, morphine injection, ondansetron (ZOFRAN) IV, ondansetron, oxyCODONE-acetaminophen, sodium chloride  Antibiotics: Anti-infectives     Start     Dose/Rate Route Frequency Ordered Stop   01/04/12 1015   DAPTOmycin (CUBICIN) 400 mg in sodium chloride 0.9 % IVPB        400 mg 216 mL/hr over 30 Minutes Intravenous Every 24 hours 01/04/12 1002     01/03/12 0100   rifampin (RIFADIN) capsule 300 mg  Status:  Discontinued        300 mg Oral Every 12 hours 01/03/12 0001 01/04/12 0901           Jeoffrey Massed, MD  [facility] Pager:[phone]  If 7PM-7AM, please contact night-coverage [email or web address] Password [email or web address] 01/04/2012, 2:17 PM   LOS: 2 days

## 2012-01-04 NOTE — Consult Note (Signed)
[facility]    Date of Admission:  01/02/2012  Date of Consult:  01/04/2012  Reason for Consult:MRSA septic hip and now antibiotic toxicity Referring Physician: Dr. Jerral Ralph   HPI: Joyce Gilmore is an 77 y.o. female with a history of right hip replacement in [place] following a fall 3 years ago presented 10/11 with worsening pain in hip and more difficulty ambulating. 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 ESR 40, CRP 7.5. She was dc on IV vancomycin and oral rifampin but unfortunately was re-admitted for neck pain, fatigue, N and Vomiting and found to have a vancomycin trough of 37, and new onset thrombocytopenia.R hip wound is still draining.  Vancomycin was held initially and then rifampin stopped yesterday. Platelets have dropped to 10. We were consulted re management of her septic hip and with concerns for abx toxicity.     Past Medical History  Diagnosis Date  . Hypertension   . Depression     hx of   . Diabetes mellitus without complication   . Chronic kidney disease     hx of kidney stones,   . Arthritis     Past Surgical History  Procedure Date  . Total hip revision 12/05/2011    Procedure: TOTAL HIP REVISION;  Surgeon: Kathryne Hitch, MD;  Location: [facility] ORS;  Service: Orthopedics;  Laterality: Right;  Right Hip Revision Arthroplasty to Total Hip, Excision of Old Implant  . Incision and drainage hip 12/22/2011    Procedure: IRRIGATION AND DEBRIDEMENT HIP;  Surgeon: Kathryne Hitch, MD;  Location: [facility] OR;  Service: Orthopedics;  Laterality: Right;  Irrigation and debridement right hip  . Incision and drainage hip 12/27/2011    Procedure: IRRIGATION AND DEBRIDEMENT HIP;  Surgeon: Kathryne Hitch, MD;  Location: [facility] OR;  Service: Orthopedics;  Laterality: Right;   Repeat irrigation and debridement  . Joint replacement     bilateral knee, right hip   ergies:   No Known Allergies   Medications: I have reviewed patients current medications as documented in Epic Anti-infectives     Start     Dose/Rate Route Frequency Ordered Stop   01/04/12 1015   DAPTOmycin (CUBICIN) 400 mg in sodium chloride 0.9 % IVPB        400 mg 216 mL/hr over 30 Minutes Intravenous Every 24 hours 01/04/12 1002     01/03/12 0100   rifampin (RIFADIN) capsule 300 mg  Status:  Discontinued        300 mg Oral Every 12 hours 01/03/12 0001 01/04/12 0901          Social History:  reports that she has never smoked. She has never used smokeless tobacco. She reports that she does not drink alcohol or use illicit drugs.  Family History  Problem Relation Age of Onset  . Colon cancer Neg Hx   . Hypertension Mother     As in HPI and primary teams notes otherwise 12 point review of systems is negative  Blood pressure 136/66, pulse 86, temperature 97.5 F (36.4 C), temperature source Oral, resp. rate 18, height 5\' 2"  (1.575 m), weight 149 lb 0.5 oz (67.6 kg), SpO2 100.00%. General: Alert and awake, oriented x3, not in any acute distress. HEENT: anicteric sclera, pupils reactive to light and accommodation, EOMI, oropharynx clear and without exudate  CVS regular rate, normal r,  no murmur rubs or gallops Chest: clear to auscultation bilaterally, no wheezing, rales or rhonchi Abdomen: soft nontender, nondistended, normal bowel sounds, Extremities: hip bandaged Skin: no rashes Neuro: nonfocal, strength and sensation intact   Results for orders placed during the hospital encounter of 01/02/12 (from the past 48 hour(s))  CBC WITH DIFFERENTIAL     Status: Abnormal   Collection Time   01/02/12  6:36 PM      Component Value Range Comment   WBC 7.0  4.0 - 10.5 K/uL    RBC 3.47 (*) 3.87 - 5.11 MIL/uL    Hemoglobin 9.6 (*) 12.0 - 15.0 g/dL    HCT 16.1 (*) 09.6 - 46.0 %    MCV 81.0   78.0 - 100.0 fL    MCH 27.7  26.0 - 34.0 pg    MCHC 34.2  30.0 - 36.0 g/dL    RDW 04.5  40.9 - 81.1 %    Platelets 101 (*) 150 - 400 K/uL PLATELET COUNT CONFIRMED BY SMEAR   Neutrophils Relative 70  43 - 77 %    Neutro Abs 4.9  1.7 - 7.7 K/uL    Lymphocytes Relative 14  12 - 46 %    Lymphs Abs 1.0  0.7 - 4.0 K/uL    Monocytes Relative 10  3 - 12 %    Monocytes Absolute 0.7  0.1 - 1.0 K/uL    Eosinophils Relative 6 (*) 0 - 5 %    Eosinophils Absolute 0.4  0.0 - 0.7 K/uL    Basophils Relative 0  0 - 1 %    Basophils Absolute 0.0  0.0 - 0.1 K/uL   BASIC METABOLIC PANEL     Status: Abnormal   Collection Time   01/02/12  6:36 PM      Component Value Range Comment   Sodium 121 (*) 135 - 145 mEq/L    Potassium 4.9  3.5 - 5.1 mEq/L    Chloride 91 (*) 96 - 112 mEq/L    CO2 20  19 - 32 mEq/L    Glucose, Bld 107 (*) 70 - 99 mg/dL    BUN 12  6 - 23 mg/dL    Creatinine, Ser 9.14 (*) 0.50 - 1.10 mg/dL    Calcium 8.5  8.4 - 78.2 mg/dL    GFR calc non Af Amer 43 (*) >90 mL/min    GFR calc Af Amer 50 (*) >90 mL/min   URINALYSIS, ROUTINE W REFLEX MICROSCOPIC     Status: Abnormal   Collection Time   01/02/12  7:46 PM      Component Value Range Comment   Color, Urine YELLOW  YELLOW    APPearance CLOUDY (*) CLEAR    Specific Gravity, Urine 1.009  1.005 - 1.030    pH 5.5  5.0 - 8.0    Glucose, UA NEGATIVE  NEGATIVE mg/dL    Hgb urine dipstick NEGATIVE  NEGATIVE    Bilirubin Urine NEGATIVE  NEGATIVE    Ketones, ur NEGATIVE  NEGATIVE mg/dL    Protein, ur NEGATIVE  NEGATIVE mg/dL    Urobilinogen, UA 0.2  0.0 - 1.0 mg/dL    Nitrite NEGATIVE  NEGATIVE    Leukocytes, UA TRACE (*) NEGATIVE   URINE CULTURE     Status: Normal   Collection Time   01/02/12  7:46 PM      Component Value Range Comment   Specimen Description URINE, CLEAN CATCH  Special Requests NONE      Culture  Setup Time 01/02/2012 20:44      Colony Count 4,000 COLONIES/ML      Culture INSIGNIFICANT GROWTH      Report Status  01/04/2012 FINAL     URINE MICROSCOPIC-ADD ON     Status: Normal   Collection Time   01/02/12  7:46 PM      Component Value Range Comment   Squamous Epithelial / LPF RARE  RARE    WBC, UA 0-2  <3 WBC/hpf    RBC / HPF 0-2  <3 RBC/hpf    Bacteria, UA RARE  RARE   D-DIMER, QUANTITATIVE     Status: Abnormal   Collection Time   01/02/12 11:28 PM      Component Value Range Comment   D-Dimer, Quant 1.80 (*) 0.00 - 0.48 ug/mL-FEU   CBC     Status: Abnormal   Collection Time   01/03/12 12:02 AM      Component Value Range Comment   WBC 5.6  4.0 - 10.5 K/uL    RBC 3.23 (*) 3.87 - 5.11 MIL/uL    Hemoglobin 8.8 (*) 12.0 - 15.0 g/dL    HCT 16.1 (*) 09.6 - 46.0 %    MCV 81.4  78.0 - 100.0 fL    MCH 27.2  26.0 - 34.0 pg    MCHC 33.5  30.0 - 36.0 g/dL    RDW 04.5  40.9 - 81.1 %    Platelets 76 (*) 150 - 400 K/uL PLATELET COUNT CONFIRMED BY SMEAR  CREATININE, SERUM     Status: Abnormal   Collection Time   01/03/12 12:02 AM      Component Value Range Comment   Creatinine, Ser 1.21 (*) 0.50 - 1.10 mg/dL    GFR calc non Af Amer 43 (*) >90 mL/min    GFR calc Af Amer 49 (*) >90 mL/min   TROPONIN I     Status: Normal   Collection Time   01/03/12 12:02 AM      Component Value Range Comment   Troponin I <0.30  <0.30 ng/mL   GLUCOSE, CAPILLARY     Status: Abnormal   Collection Time   01/03/12 12:28 AM      Component Value Range Comment   Glucose-Capillary 126 (*) 70 - 99 mg/dL    Comment 1 Documented in Chart      Comment 2 Notify RN     PHOSPHORUS     Status: Normal   Collection Time   01/03/12  5:32 AM      Component Value Range Comment   Phosphorus 2.5  2.3 - 4.6 mg/dL   TSH     Status: Normal   Collection Time   01/03/12  5:32 AM      Component Value Range Comment   TSH 0.655  0.350 - 4.500 uIU/mL   COMPREHENSIVE METABOLIC PANEL     Status: Abnormal   Collection Time   01/03/12  5:32 AM      Component Value Range Comment   Sodium 126 (*) 135 - 145 mEq/L    Potassium 4.6  3.5 - 5.1 mEq/L     Chloride 96  96 - 112 mEq/L    CO2 21  19 - 32 mEq/L    Glucose, Bld 135 (*) 70 - 99 mg/dL    BUN 11  6 - 23 mg/dL    Creatinine, Ser 9.14 (*) 0.50 - 1.10 mg/dL    Calcium  8.1 (*) 8.4 - 10.5 mg/dL    Total Protein 5.5 (*) 6.0 - 8.3 g/dL    Albumin 2.4 (*) 3.5 - 5.2 g/dL    AST 20  0 - 37 U/L    ALT 10  0 - 35 U/L    Alkaline Phosphatase 62  39 - 117 U/L    Total Bilirubin 0.3  0.3 - 1.2 mg/dL    GFR calc non Af Amer 43 (*) >90 mL/min    GFR calc Af Amer 49 (*) >90 mL/min   CBC     Status: Abnormal   Collection Time   01/03/12  5:32 AM      Component Value Range Comment   WBC 5.7  4.0 - 10.5 K/uL    RBC 3.23 (*) 3.87 - 5.11 MIL/uL    Hemoglobin 8.6 (*) 12.0 - 15.0 g/dL    HCT 40.9 (*) 81.1 - 46.0 %    MCV 80.5  78.0 - 100.0 fL    MCH 26.6  26.0 - 34.0 pg    MCHC 33.1  30.0 - 36.0 g/dL    RDW 91.4  78.2 - 95.6 %    Platelets 66 (*) 150 - 400 K/uL CONSISTENT WITH PREVIOUS RESULT  TROPONIN I     Status: Normal   Collection Time   01/03/12  5:32 AM      Component Value Range Comment   Troponin I <0.30  <0.30 ng/mL   MAGNESIUM     Status: Abnormal   Collection Time   01/03/12  5:32 AM      Component Value Range Comment   Magnesium 1.3 (*) 1.5 - 2.5 mg/dL   OSMOLALITY, URINE     Status: Abnormal   Collection Time   01/03/12  6:45 AM      Component Value Range Comment   Osmolality, Ur 227 (*) 390 - 1090 mOsm/kg   SODIUM, URINE, RANDOM     Status: Normal   Collection Time   01/03/12  6:45 AM      Component Value Range Comment   Sodium, Ur 60     CREATININE, URINE, RANDOM     Status: Normal   Collection Time   01/03/12  6:45 AM      Component Value Range Comment   Creatinine, Urine 30.42     GLUCOSE, CAPILLARY     Status: Abnormal   Collection Time   01/03/12  8:44 AM      Component Value Range Comment   Glucose-Capillary 131 (*) 70 - 99 mg/dL   TROPONIN I     Status: Normal   Collection Time   01/03/12 11:20 AM      Component Value Range Comment   Troponin I <0.30   <0.30 ng/mL   VANCOMYCIN, TROUGH     Status: Abnormal   Collection Time   01/03/12 11:20 AM      Component Value Range Comment   Vancomycin Tr 37.4 (*) 10.0 - 20.0 ug/mL   BASIC METABOLIC PANEL     Status: Abnormal   Collection Time   01/03/12 11:20 AM      Component Value Range Comment   Sodium 125 (*) 135 - 145 mEq/L    Potassium 4.4  3.5 - 5.1 mEq/L    Chloride 94 (*) 96 - 112 mEq/L    CO2 21  19 - 32 mEq/L    Glucose, Bld 143 (*) 70 - 99 mg/dL    BUN 12  6 - 23  mg/dL    Creatinine, Ser 1.61 (*) 0.50 - 1.10 mg/dL    Calcium 8.2 (*) 8.4 - 10.5 mg/dL    GFR calc non Af Amer 39 (*) >90 mL/min    GFR calc Af Amer 45 (*) >90 mL/min   CLOSTRIDIUM DIFFICILE BY PCR     Status: Normal   Collection Time   01/03/12 11:27 AM      Component Value Range Comment   C difficile by pcr NEGATIVE  NEGATIVE   GLUCOSE, CAPILLARY     Status: Abnormal   Collection Time   01/03/12 12:28 PM      Component Value Range Comment   Glucose-Capillary 151 (*) 70 - 99 mg/dL   OSMOLALITY     Status: Abnormal   Collection Time   01/03/12  5:10 PM      Component Value Range Comment   Osmolality 271 (*) 275 - 300 mOsm/kg   GLUCOSE, CAPILLARY     Status: Abnormal   Collection Time   01/03/12  5:41 PM      Component Value Range Comment   Glucose-Capillary 286 (*) 70 - 99 mg/dL   SODIUM, URINE, RANDOM     Status: Normal   Collection Time   01/03/12  8:07 PM      Component Value Range Comment   Sodium, Ur 55     OSMOLALITY, URINE     Status: Abnormal   Collection Time   01/03/12  8:08 PM      Component Value Range Comment   Osmolality, Ur 262 (*) 390 - 1090 mOsm/kg   GLUCOSE, CAPILLARY     Status: Abnormal   Collection Time   01/03/12  8:57 PM      Component Value Range Comment   Glucose-Capillary 148 (*) 70 - 99 mg/dL   BASIC METABOLIC PANEL     Status: Abnormal   Collection Time   01/04/12  6:35 AM      Component Value Range Comment   Sodium 128 (*) 135 - 145 mEq/L    Potassium 4.2  3.5 - 5.1 mEq/L     Chloride 97  96 - 112 mEq/L    CO2 21  19 - 32 mEq/L    Glucose, Bld 198 (*) 70 - 99 mg/dL    BUN 11  6 - 23 mg/dL    Creatinine, Ser 0.96 (*) 0.50 - 1.10 mg/dL    Calcium 7.9 (*) 8.4 - 10.5 mg/dL    GFR calc non Af Amer 43 (*) >90 mL/min    GFR calc Af Amer 50 (*) >90 mL/min   VANCOMYCIN, RANDOM     Status: Normal   Collection Time   01/04/12  6:35 AM      Component Value Range Comment   Vancomycin Rm 25.2     GLUCOSE, CAPILLARY     Status: Abnormal   Collection Time   01/04/12  7:37 AM      Component Value Range Comment   Glucose-Capillary 214 (*) 70 - 99 mg/dL   CBC     Status: Abnormal   Collection Time   01/04/12  8:10 AM      Component Value Range Comment   WBC 3.8 (*) 4.0 - 10.5 K/uL    RBC 3.10 (*) 3.87 - 5.11 MIL/uL    Hemoglobin 8.4 (*) 12.0 - 15.0 g/dL    HCT 04.5 (*) 40.9 - 46.0 %    MCV 81.3  78.0 - 100.0 fL    MCH  27.1  26.0 - 34.0 pg    MCHC 33.3  30.0 - 36.0 g/dL    RDW 40.9  81.1 - 91.4 %    Platelets 12 (*) 150 - 400 K/uL   SAVE SMEAR     Status: Normal   Collection Time   01/04/12  8:16 AM      Component Value Range Comment   Smear Review SMEAR STAINED AND AVAILABLE FOR REVIEW     PREPARE PLATELET PHERESIS     Status: Normal (Preliminary result)   Collection Time   01/04/12  9:38 AM      Component Value Range Comment   Unit Number [id number]      Blood Component Type PLTPHER LR1      Unit division 00      Status of Unit ISSUED      Transfusion Status OK TO TRANSFUSE     LACTATE DEHYDROGENASE     Status: Normal   Collection Time   01/04/12  9:50 AM      Component Value Range Comment   LDH 142  94 - 250 U/L   PROTIME-INR     Status: Abnormal   Collection Time   01/04/12  9:50 AM      Component Value Range Comment   Prothrombin Time 15.4 (*) 11.6 - 15.2 seconds    INR 1.24  0.00 - 1.49   CBC     Status: Abnormal   Collection Time   01/04/12  9:50 AM      Component Value Range Comment   WBC 3.7 (*) 4.0 - 10.5 K/uL    RBC 3.12 (*) 3.87 -  5.11 MIL/uL    Hemoglobin 8.4 (*) 12.0 - 15.0 g/dL    HCT 78.4 (*) 69.6 - 46.0 %    MCV 81.1  78.0 - 100.0 fL    MCH 26.9  26.0 - 34.0 pg    MCHC 33.2  30.0 - 36.0 g/dL    RDW 29.5  28.4 - 13.2 %    Platelets 10 (*) 150 - 400 K/uL CRITICAL VALUE NOTED.  VALUE IS CONSISTENT WITH PREVIOUSLY REPORTED AND CALLED VALUE.  GLUCOSE, CAPILLARY     Status: Abnormal   Collection Time   01/04/12 12:00 PM      Component Value Range Comment   Glucose-Capillary 176 (*) 70 - 99 mg/dL       Component Value Date/Time   SDES URINE, CLEAN CATCH 01/02/2012 1946   SPECREQUEST NONE 01/02/2012 1946   CULT INSIGNIFICANT GROWTH 01/02/2012 1946   REPTSTATUS 01/04/2012 FINAL 01/02/2012 1946   Dg Chest 2 View  01/02/2012  *RADIOLOGY REPORT*  Clinical Data: Weakness, shortness of breath  CHEST - 2 VIEW  Comparison: 12/04/2011  Findings: Cardiomediastinal silhouette is stable.  No acute infiltrate or pleural effusion.  No pulmonary edema.  There is a right arm PICC line with tip in right atrium.  For distal SVC position the PICC line should be retracted about 2.5 cm. No diagnostic pneumothorax.  IMPRESSION:  No acute infiltrate or pleural effusion.  No pulmonary edema. There is a right arm PICC line with tip in right atrium.  For distal SVC position the PICC line should be retracted about 2.5 cm. No diagnostic pneumothorax.   Original Report Authenticated By: Natasha Mead, M.D.    Ct Angio Chest Pe W/cm &/or Wo Cm  01/03/2012  *RADIOLOGY REPORT*  Clinical Data: Chest pain, shortness of breath, neck pain and headache.  CT ANGIOGRAPHY CHEST  Technique:  Multidetector CT imaging of the chest using the standard protocol during bolus administration of intravenous contrast. Multiplanar reconstructed images including MIPs were obtained and reviewed to evaluate the vascular anatomy.  Contrast: OMNIPAQUE IOHEXOL 350 MG/ML SOLN  Comparison: Chest radiograph performed 01/02/2012  Findings: There is no evidence of pulmonary embolus.   Minimal bilateral atelectasis is noted.  There is no evidence of significant focal consolidation, pleural effusion or pneumothorax. No masses are identified; no abnormal focal contrast enhancement is seen.  The mediastinum is unremarkable in appearance.  No mediastinal lymphadenopathy is seen.  No pericardial effusion is identified. The great vessels are grossly unremarkable in appearance.  No axillary lymphadenopathy is seen.  The visualized portions of the thyroid gland are unremarkable in appearance.  The visualized portions of the liver and spleen are unremarkable.  No acute osseous abnormalities are seen.  IMPRESSION:  1.  No evidence of pulmonary embolus. 2.  Minimal bilateral atelectasis noted; lungs otherwise clear.   Original Report Authenticated By: Tonia Ghent, M.D.      Recent Results (from the past 720 hour(s))  URINE CULTURE     Status: Normal   Collection Time   12/22/11  3:12 PM      Component Value Range Status Comment   Specimen Description URINE, RANDOM   Final    Special Requests NONE   Final    Culture  Setup Time 12/22/2011 17:28   Final    Colony Count >=100,000 COLONIES/ML   Final    Culture ESCHERICHIA COLI   Final    Report Status 12/24/2011 FINAL   Final    Organism ID, Bacteria ESCHERICHIA COLI   Final   SURGICAL PCR SCREEN     Status: Normal   Collection Time   12/22/11  4:25 PM      Component Value Range Status Comment   MRSA, PCR NEGATIVE  NEGATIVE Final    Staphylococcus aureus NEGATIVE  NEGATIVE Final   CULTURE, ROUTINE-ABSCESS     Status: Normal   Collection Time   12/22/11  6:03 PM      Component Value Range Status Comment   Specimen Description ABSCESS HIP RIGHT   Final    Special Requests SUPERFICIAL   Final    Gram Stain     Final    Value: FEW WBC PRESENT,BOTH PMN AND MONONUCLEAR     RARE GRAM POSITIVE COCCI     IN PAIRS Performed at [facility]   Culture     Final    Value: MODERATE METHICILLIN RESISTANT STAPHYLOCOCCUS AUREUS      Note: RIFAMPIN AND GENTAMICIN SHOULD NOT BE USED AS SINGLE DRUGS FOR TREATMENT OF STAPH INFECTIONS. CRITICAL RESULT CALLED TO, READ BACK BY AND VERIFIED WITH: CAROL S@1018  ON [id number] BY [facility]   Report Status 12/25/2011 FINAL   Final    Organism ID, Bacteria METHICILLIN RESISTANT STAPHYLOCOCCUS AUREUS   Final   ANAEROBIC CULTURE     Status: Normal   Collection Time   12/22/11  6:03 PM      Component Value Range Status Comment   Specimen Description ABSCESS HIP RIGHT   Final    Special Requests SUPERFICIAL   Final    Gram Stain     Final    Value: FEW WBC PRESENT,BOTH PMN AND MONONUCLEAR     RARE GRAM POSITIVE COCCI     IN PAIRS Performed at [facility]   Culture NO ANAEROBES ISOLATED   Final    Report Status  12/27/2011 FINAL   Final   GRAM STAIN     Status: Normal   Collection Time   12/22/11  6:03 PM      Component Value Range Status Comment   Specimen Description ABSCESS HIP RIGHT   Final    Special Requests SUPERFICIAL   Final    Gram Stain     Final    Value: FEW WBC PRESENT,BOTH PMN AND MONONUCLEAR     RARE GRAM POSITIVE COCCI IN PAIRS   Report Status 12/22/2011 FINAL   Final   CULTURE, ROUTINE-ABSCESS     Status: Normal   Collection Time   12/22/11  6:20 PM      Component Value Range Status Comment   Specimen Description ABSCESS HIP RIGHT   Final    Special Requests DEEP   Final    Gram Stain     Final    Value: RARE WBC PRESENT,BOTH PMN AND MONONUCLEAR     NO ORGANISMS SEEN     Performed at [facility]   Culture     Final    Value: MODERATE METHICILLIN RESISTANT STAPHYLOCOCCUS AUREUS     Note: RIFAMPIN AND GENTAMICIN SHOULD NOT BE USED AS SINGLE DRUGS FOR TREATMENT OF STAPH INFECTIONS. CRITICAL RESULT CALLED TO, READ BACK BY AND VERIFIED WITH: CAROL S@1018  ON [id number] BY [facility]   Report Status 12/25/2011 FINAL   Final    Organism ID, Bacteria METHICILLIN RESISTANT STAPHYLOCOCCUS AUREUS   Final   ANAEROBIC CULTURE     Status: Normal   Collection Time    12/22/11  6:20 PM      Component Value Range Status Comment   Specimen Description ABSCESS HIP RIGHT   Final    Special Requests DEEP   Final    Gram Stain     Final    Value: RARE WBC PRESENT,BOTH PMN AND MONONUCLEAR     NO ORGANISMS SEEN     Performed at [facility]   Culture NO ANAEROBES ISOLATED   Final    Report Status 12/27/2011 FINAL   Final   GRAM STAIN     Status: Normal   Collection Time   12/22/11  6:20 PM      Component Value Range Status Comment   Specimen Description ABSCESS HIP RIGHT   Final    Special Requests DEEP   Final    Gram Stain     Final    Value: RARE WBC PRESENT,BOTH PMN AND MONONUCLEAR     NO ORGANISMS SEEN   Report Status 12/22/2011 FINAL   Final   URINE CULTURE     Status: Normal   Collection Time   01/02/12  7:46 PM      Component Value Range Status Comment   Specimen Description URINE, CLEAN CATCH   Final    Special Requests NONE   Final    Culture  Setup Time 01/02/2012 20:44   Final    Colony Count 4,000 COLONIES/ML   Final    Culture INSIGNIFICANT GROWTH   Final    Report Status 01/04/2012 FINAL   Final   CLOSTRIDIUM DIFFICILE BY PCR     Status: Normal   Collection Time   01/03/12 11:27 AM      Component Value Range Status Comment   C difficile by pcr NEGATIVE  NEGATIVE Final      Impression/Recommendation   77 yo with 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 Vanco and rifampin both stopped.   #1 Prosthetic hip infeciton: switch to daptomycin and complete original course of therapy. Concerned pt may fail abx alone. Will need an oral suppressive regimen post IV abx  #2 TTepnia: could certainly be related to rifampin which was only stopped yesterday, or vancomycin though htat should be washing  out of system  #3 N, V: again likely due to rifampin rather than vanco  Dr. Drue Second is back tomorrow.  Thank you so much for this interesting consult  [facility] [phone] (pager) [phone] (office) 01/04/2012, 3:21 PM  Paulette Blanch Dam 01/04/2012, 3:21 PM

## 2012-01-04 NOTE — Progress Notes (Signed)
Patient ID: Joyce Gilmore, female   DOB: 1934-04-09, 77 y.o.   MRN: [id number] In the hospital currently due to toxicity from the vancomycin.  Switched to a different antibiotic to treat MRSA in attempt to salvage a right prosthetic hip joint infection.  Will need IV antibiotic for 5 more weeks.  Her right hip wound is still draining.  Will follow.

## 2012-01-04 NOTE — Progress Notes (Addendum)
ANTIBIOTIC CONSULT NOTE - F/U  Pharmacy Consult for Daptomycin  Indication: Infected hip implant  No Known Allergies   Vital Signs: Temp: 98.5 F (36.9 C) (11/10 0700) Temp src: Oral (11/10 0700) BP: 129/54 mmHg (11/10 0700) Pulse Rate: 81  (11/10 0700)  Labs:  Basename 01/04/12 0810 01/04/12 0635 01/03/12 1120 01/03/12 0645 01/03/12 0532 01/03/12 0002  WBC 3.8* -- -- -- 5.7 5.6  HGB 8.4* -- -- -- 8.6* 8.8*  PLT 12* -- -- -- 66* 76*  LABCREA -- -- -- 30.42 -- --  CREATININE -- 1.20* 1.31* -- 1.21* --    Microbiology: Recent Results (from the past 720 hour(s))  URINE CULTURE     Status: Normal   Collection Time   12/22/11  3:12 PM      Component Value Range Status Comment   Specimen Description URINE, RANDOM   Final    Special Requests NONE   Final    Culture  Setup Time 12/22/2011 17:28   Final    Colony Count >=100,000 COLONIES/ML   Final    Culture ESCHERICHIA COLI   Final    Report Status 12/24/2011 FINAL   Final    Organism ID, Bacteria ESCHERICHIA COLI   Final   SURGICAL PCR SCREEN     Status: Normal   Collection Time   12/22/11  4:25 PM      Component Value Range Status Comment   MRSA, PCR NEGATIVE  NEGATIVE Final    Staphylococcus aureus NEGATIVE  NEGATIVE Final   CULTURE, ROUTINE-ABSCESS     Status: Normal   Collection Time   12/22/11  6:03 PM      Component Value Range Status Comment   Specimen Description ABSCESS HIP RIGHT   Final    Special Requests SUPERFICIAL   Final    Gram Stain     Final    Value: FEW WBC PRESENT,BOTH PMN AND MONONUCLEAR     RARE GRAM POSITIVE COCCI     IN PAIRS Performed at [facility]   Culture     Final    Value: MODERATE METHICILLIN RESISTANT STAPHYLOCOCCUS AUREUS     Note: RIFAMPIN AND GENTAMICIN SHOULD NOT BE USED AS SINGLE DRUGS FOR TREATMENT OF STAPH INFECTIONS. CRITICAL RESULT CALLED TO, READ BACK BY AND VERIFIED WITH: CAROL S@1018  ON [id number] BY [facility]   Report Status 12/25/2011 FINAL   Final    Organism ID,  Bacteria METHICILLIN RESISTANT STAPHYLOCOCCUS AUREUS   Final   ANAEROBIC CULTURE     Status: Normal   Collection Time   12/22/11  6:03 PM      Component Value Range Status Comment   Specimen Description ABSCESS HIP RIGHT   Final    Special Requests SUPERFICIAL   Final    Gram Stain     Final    Value: FEW WBC PRESENT,BOTH PMN AND MONONUCLEAR     RARE GRAM POSITIVE COCCI     IN PAIRS Performed at [facility]   Culture NO ANAEROBES ISOLATED   Final    Report Status 12/27/2011 FINAL   Final   GRAM STAIN     Status: Normal   Collection Time   12/22/11  6:03 PM      Component Value Range Status Comment   Specimen Description ABSCESS HIP RIGHT   Final    Special Requests SUPERFICIAL   Final    Gram Stain     Final    Value: FEW WBC PRESENT,BOTH PMN AND MONONUCLEAR  RARE GRAM POSITIVE COCCI IN PAIRS   Report Status 12/22/2011 FINAL   Final   CULTURE, ROUTINE-ABSCESS     Status: Normal   Collection Time   12/22/11  6:20 PM      Component Value Range Status Comment   Specimen Description ABSCESS HIP RIGHT   Final    Special Requests DEEP   Final    Gram Stain     Final    Value: RARE WBC PRESENT,BOTH PMN AND MONONUCLEAR     NO ORGANISMS SEEN     Performed at [facility]   Culture     Final    Value: MODERATE METHICILLIN RESISTANT STAPHYLOCOCCUS AUREUS     Note: RIFAMPIN AND GENTAMICIN SHOULD NOT BE USED AS SINGLE DRUGS FOR TREATMENT OF STAPH INFECTIONS. CRITICAL RESULT CALLED TO, READ BACK BY AND VERIFIED WITH: CAROL S@1018  ON [id number] BY [facility]   Report Status 12/25/2011 FINAL   Final    Organism ID, Bacteria METHICILLIN RESISTANT STAPHYLOCOCCUS AUREUS   Final   ANAEROBIC CULTURE     Status: Normal   Collection Time   12/22/11  6:20 PM      Component Value Range Status Comment   Specimen Description ABSCESS HIP RIGHT   Final    Special Requests DEEP   Final    Gram Stain     Final    Value: RARE WBC PRESENT,BOTH PMN AND MONONUCLEAR     NO ORGANISMS SEEN      Performed at [facility]   Culture NO ANAEROBES ISOLATED   Final    Report Status 12/27/2011 FINAL   Final   GRAM STAIN     Status: Normal   Collection Time   12/22/11  6:20 PM      Component Value Range Status Comment   Specimen Description ABSCESS HIP RIGHT   Final    Special Requests DEEP   Final    Gram Stain     Final    Value: RARE WBC PRESENT,BOTH PMN AND MONONUCLEAR     NO ORGANISMS SEEN   Report Status 12/22/2011 FINAL   Final   URINE CULTURE     Status: Normal   Collection Time   01/02/12  7:46 PM      Component Value Range Status Comment   Specimen Description URINE, CLEAN CATCH   Final    Special Requests NONE   Final    Culture  Setup Time 01/02/2012 20:44   Final    Colony Count 4,000 COLONIES/ML   Final    Culture INSIGNIFICANT GROWTH   Final    Report Status 01/04/2012 FINAL   Final   CLOSTRIDIUM DIFFICILE BY PCR     Status: Normal   Collection Time   01/03/12 11:27 AM      Component Value Range Status Comment   C difficile by pcr NEGATIVE  NEGATIVE Final     Medical History: Past Medical History  Diagnosis Date  . Hypertension   . Depression     hx of   . Diabetes mellitus without complication   . Chronic kidney disease     hx of kidney stones,   . Arthritis     Medications:  Prescriptions prior to admission  Medication Sig Dispense Refill  . aspirin EC 325 MG EC tablet Take 1 tablet (325 mg total) by mouth daily.  30 tablet  0  . calcium-vitamin D (OSCAL WITH D) 500-200 MG-UNIT per tablet Take 1 tablet by mouth daily.      Marland Kitchen  Cholecalciferol (VITAMIN D-400 PO) Take 400 Units by mouth daily.      Marland Kitchen glipiZIDE (GLUCOTROL XL) 5 MG 24 hr tablet Take 5 mg by mouth daily.      Marland Kitchen lisinopril (PRINIVIL,ZESTRIL) 40 MG tablet Take 40 mg by mouth every morning.       . metFORMIN (GLUCOPHAGE) 1000 MG tablet Take 1,000 mg by mouth 2 (two) times daily with a meal.      . methocarbamol (ROBAXIN) 500 MG tablet Take 1 tablet (500 mg total) by mouth every 6 (six)  hours as needed.  60 tablet  0  . ondansetron (ZOFRAN) 4 MG tablet Take 4 mg by mouth every 8 (eight) hours as needed. For nausea      . oxyCODONE-acetaminophen (PERCOCET/ROXICET) 5-325 MG per tablet Take 1 tablet by mouth every 8 (eight) hours as needed. For pain      . pravastatin (PRAVACHOL) 40 MG tablet Take 40 mg by mouth at bedtime.       . rifampin (RIFADIN) 300 MG capsule Take 1 capsule (300 mg total) by mouth every 12 (twelve) hours.  90 capsule  1  . sitaGLIPtin (JANUVIA) 100 MG tablet Take 100 mg by mouth every morning.       . sodium chloride 0.9 % SOLN 500 mL with vancomycin 1000 MG SOLR 1,500 mg Inject 1,500 mg into the vein daily.  4000 mL  1   Scheduled:     . diphenhydrAMINE  25 mg Intravenous Once  . furosemide  20 mg Intravenous Once  . insulin aspart  0-5 Units Subcutaneous QHS  . insulin aspart  0-9 Units Subcutaneous TID WC  . saccharomyces boulardii  250 mg Oral BID  . simvastatin  20 mg Oral q1800  . sodium chloride  3 mL Intravenous Q12H  . [DISCONTINUED] acetaminophen  1,000 mg Oral TID  . [DISCONTINUED] acetaminophen  650 mg Oral Once  . [DISCONTINUED] aspirin EC  325 mg Oral Daily  . [DISCONTINUED] aspirin EC  81 mg Oral Daily  . [DISCONTINUED] enoxaparin (LOVENOX) injection  40 mg Subcutaneous Q24H  . [DISCONTINUED] furosemide  20 mg Oral Daily  . [DISCONTINUED] potassium chloride  40 mEq Oral Once  . [DISCONTINUED] rifampin  300 mg Oral Q12H    Assessment: 77yo female was recently discharged with PICC for 6 weeks of ABX for MRSA infection of hip s/p right hip revision arthroplasty, now c/o generalized weakness, HA, N/V/D, and decreased PO intake, found to be hyponatremic with mild ARF, thought to be due to dehydration vs vanc rxn vs combo  Vancomycin trough ordered on 11/9 before vancomycin was started. This result came back at a level of  37.4 (goal 15-20 mcg/ml). This trough was drawn 2 hours late, suggesting that the true trough was more than likely in  the 40s. The patient has NOT received any vancomycin since being in the hospital this admission. Home regimen was 1500mg  IV q24.   Pharmacy now consulted to start Daptomycin   Goal of Therapy:  Eradication of infection   Plan:  1) Daptomycin 6mg /kg/day (400 mg/day) 2) Monitor weekly CPK. Patient is also on a statin which may increase risk of rhabdomyolysis with daptomycin. Please consider decreasing statin dose/stopping if patient starts to experience signs/symptoms of rhabdo  Thank you, Franchot Erichsen, Pharm.D. Clinical Pharmacist   01/04/2012 9:54 AM

## 2012-01-05 LAB — CBC
HCT: 23.3 % — ABNORMAL LOW (ref 36.0–46.0)
Hemoglobin: 7.9 g/dL — ABNORMAL LOW (ref 12.0–15.0)
MCH: 27.4 pg (ref 26.0–34.0)
MCH: 26.6 pg (ref 26.0–34.0)
MCV: 80.9 fL (ref 78.0–100.0)
Platelets: 12 10*3/uL — CL (ref 150–400)
Platelets: 15 10*3/uL — CL (ref 150–400)
RBC: 2.88 MIL/uL — ABNORMAL LOW (ref 3.87–5.11)
RBC: 3.19 MIL/uL — ABNORMAL LOW (ref 3.87–5.11)
WBC: 3.9 10*3/uL — ABNORMAL LOW (ref 4.0–10.5)

## 2012-01-05 LAB — BASIC METABOLIC PANEL
BUN: 9 mg/dL (ref 6–23)
CO2: 23 mEq/L (ref 19–32)
Calcium: 7.8 mg/dL — ABNORMAL LOW (ref 8.4–10.5)
Chloride: 99 mEq/L (ref 96–112)
Creatinine, Ser: 1.12 mg/dL — ABNORMAL HIGH (ref 0.50–1.10)
Glucose, Bld: 168 mg/dL — ABNORMAL HIGH (ref 70–99)

## 2012-01-05 LAB — GLUCOSE, CAPILLARY
Glucose-Capillary: 161 mg/dL — ABNORMAL HIGH (ref 70–99)
Glucose-Capillary: 215 mg/dL — ABNORMAL HIGH (ref 70–99)
Glucose-Capillary: 246 mg/dL — ABNORMAL HIGH (ref 70–99)

## 2012-01-05 LAB — CK: Total CK: 28 U/L (ref 7–177)

## 2012-01-05 LAB — PREPARE PLATELET PHERESIS: Unit division: 0

## 2012-01-05 MED ORDER — ACETAMINOPHEN 325 MG PO TABS
650.0000 mg | ORAL_TABLET | Freq: Once | ORAL | Status: AC
  Administered 2012-01-05: 650 mg via ORAL
  Filled 2012-01-05: qty 2

## 2012-01-05 MED ORDER — INSULIN ASPART 100 UNIT/ML ~~LOC~~ SOLN
0.0000 [IU] | Freq: Three times a day (TID) | SUBCUTANEOUS | Status: DC
  Administered 2012-01-06 (×2): 8 [IU] via SUBCUTANEOUS
  Administered 2012-01-06: 3 [IU] via SUBCUTANEOUS
  Administered 2012-01-07: 15 [IU] via SUBCUTANEOUS
  Administered 2012-01-07: 2 [IU] via SUBCUTANEOUS
  Administered 2012-01-07: 15 [IU] via SUBCUTANEOUS
  Administered 2012-01-08: 8 [IU] via SUBCUTANEOUS
  Administered 2012-01-08: 5 [IU] via SUBCUTANEOUS

## 2012-01-05 MED ORDER — INSULIN ASPART 100 UNIT/ML ~~LOC~~ SOLN: 0.0000 [IU] | Freq: Every day | SUBCUTANEOUS | Status: DC

## 2012-01-05 MED ORDER — DIPHENHYDRAMINE HCL 50 MG/ML IJ SOLN
25.0000 mg | Freq: Once | INTRAMUSCULAR | Status: AC
  Administered 2012-01-05: 25 mg via INTRAVENOUS
  Filled 2012-01-05: qty 1

## 2012-01-05 MED ORDER — FUROSEMIDE 10 MG/ML IJ SOLN
20.0000 mg | Freq: Once | INTRAMUSCULAR | Status: AC
  Administered 2012-01-05: 20 mg via INTRAVENOUS
  Filled 2012-01-05: qty 2

## 2012-01-05 NOTE — Progress Notes (Signed)
[facility]    Date of Admission:  01/02/2012   Total days of antibiotics 8        Day 2 daptomycin        - previously on vanco/rif s/p 10/2 I X D   ID: Shayle Donahoo is a 77 y.o. 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 Vanco and rifampin both stopped.    Principal Problem:  *Thrombocytopenia Active Problems:  DIABETES MELLITUS, TYPE II  HYPERTENSION  Hyponatremia  Infection of right prosthetic hip joint  Chest pain   Subjective: Had episode of epistaxis yesterday but not this morning. No blood in stool or bloody gums. No new bruises that she has noticed. She has tolerated plt transfusions without difficulty  Medications:     . [COMPLETED] acetaminophen  650 mg Oral Once  . DAPTOmycin (CUBICIN)  IV  400 mg Intravenous Q24H  . [COMPLETED] diphenhydrAMINE  25 mg Intravenous Once  . [COMPLETED] furosemide  20 mg Intravenous Once  . insulin aspart  0-5 Units Subcutaneous QHS  . insulin aspart  0-9 Units Subcutaneous TID WC  . methylPREDNISolone (SOLU-MEDROL) injection  60 mg Intravenous Daily  . sodium chloride  3 mL Intravenous Q12H    Objective: Vital signs in last 24 hours: Temp:  [97.9 F (36.6 C)-99.8 F (37.7 C)] 97.9 F (36.6 C) (11/11 1359) Pulse Rate:  [70-89] 84  (11/11 1359) Resp:  [18-20] 18  (11/11 1359) BP: (111-159)/(44-89) 149/89 mmHg (11/11 1359) SpO2:  [98 %-100 %] 98 % (11/11 1359) Physical Exam  Constitutional: oriented to person, place, and time.  appears well-developed and well-nourished. No distress.  HENT:  Mouth/Throat: Oropharynx is clear and moist. No oropharyngeal exudate.  Cardiovascular: Normal rate, regular rhythm and normal heart sounds. Exam  reveals no gallop and no friction rub.  No murmur heard.  Pulmonary/Chest: Effort normal and breath sounds normal. No respiratory distress. He has no wheezes.  Abdominal: Soft. Bowel sounds are normal. He exhibits no distension. There is no tenderness.  Lymphadenopathy:  no cervical adenopathy.  Skin: Skin is warm and dry. Right hip surgical incision site not necessarily erythematous but has 2 points along incision site that has serosanginous drainage. Not indurated, nontender. No warmth.  Lab Results  Basename 01/05/12 1505 01/05/12 0540 01/04/12 0635  WBC 5.0 3.9* --  HGB 8.5* 7.9* --  HCT 25.7* 23.3* --  NA -- 131* 128*  K -- 3.7 4.2  CL -- 99 97  CO2 -- 23 21  BUN -- 9 11  CREATININE -- 1.12* 1.20*  GLU -- -- --   Liver Panel  Basename 01/03/12 0532  PROT 5.5*  ALBUMIN 2.4*  AST 20  ALT 10  ALKPHOS 62  BILITOT 0.3  BILIDIR --  IBILI --    Microbiology: 10/28 MRSA ( vanco 1, bactrim <20 S, rifampin S, tetra S) Studies/Results: No results found.   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 Vanco and rifampin both stopped. No improvement with platelet transfusions.  1)  MRSA prosthetic joint infection = continue with daptomycin  2) thrombocytopenia = thought to be related to rifampin use. Continue with supportive care, steroids. recs per hematology. No signs of active bleeding presently.  3) supratherapeutic vancomycin = appears to be clearing sufficiently.  Duke Salvia Drue Second MD MPH [facility] [phone]      Drue Second [facility] Cell: [phone] Pager: [phone]  01/05/2012, 9:00 PM

## 2012-01-05 NOTE — Progress Notes (Signed)
Physical Therapy Treatment Patient Details Name: Joyce Gilmore MRN: [id number] DOB: 18-Jan-1935 Today's Date: 01/05/2012 Time: 9629-5284 PT Time Calculation (min): 22 min  PT Assessment / Plan / Recommendation Comments on Treatment Session  Admitted s/p TH revision and subsequent I&D to hip incision for MRSA; Pt. continues to progress with mobility and complete all tasks asked of her without difficulty. Pts. daughter was present during tx. and acted as Nurse, learning disability. Daughter very good with mother and actively participated in tx.    Follow Up Recommendations  Home health PT           Equipment Recommendations  None recommended by PT          Plan Discharge plan remains appropriate;Frequency remains appropriate    Precautions / Restrictions Precautions Precautions: Anterior Hip   Pertinent Vitals/Pain No pain reported    Mobility  Bed Mobility Supine to Sit: 6: Modified independent (Device/Increase time);HOB flat Sitting - Scoot to Edge of Bed: 6: Modified independent (Device/Increase time) Sit to Supine: 6: Modified independent (Device/Increase time);HOB flat;With rail Transfers Sit to Stand: 6: Modified independent (Device/Increase time);From bed;From toilet Stand to Sit: 6: Modified independent (Device/Increase time);To bed;To toilet Details for Transfer Assistance: Performed x2 from bed to toliet then toliet to bed after ambulation. Ambulation/Gait Ambulation/Gait Assistance: 5: Supervision Ambulation Distance (Feet): 220 Feet Assistive device: Rolling walker Ambulation/Gait Assistance Details: Required supervision for safety. Pt. able to manage RW without cues. Gait Pattern: Step-to pattern Gait velocity: decreased Stairs: No      PT Goals Acute Rehab PT Goals PT Goal: Supine/Side to Sit - Progress: Progressing toward goal PT Goal: Sit to Supine/Side - Progress: Progressing toward goal PT Goal: Sit to Stand - Progress: Progressing toward goal PT  Goal: Stand to Sit - Progress: Progressing toward goal PT Goal: Ambulate - Progress: Progressing toward goal  Visit Information  Last PT Received On: 01/05/12 Assistance Needed: +1    Subjective Data  Subjective: Pt. agreed to get OOB   Cognition  Overall Cognitive Status: Appears within functional limits for tasks assessed/performed Arousal/Alertness: Awake/alert Orientation Level: Appears intact for tasks assessed Behavior During Session: [facility] for tasks performed       End of Session PT - End of Session Activity Tolerance: Patient tolerated treatment well Patient left: in bed;with call bell/phone within reach;with family/visitor present Nurse Communication: Mobility status     Army Chaco SPT 01/05/2012, 2:54 PM

## 2012-01-05 NOTE — Progress Notes (Signed)
01/05/2012 1620 Pt is active with Genevieve Norlander for [facility]. Isidoro Donning RN CCM Case Mgmt phone [phone]

## 2012-01-05 NOTE — Progress Notes (Signed)
PATIENT DETAILS Name: Joyce Gilmore Age: 77 y.o. Sex: female Date of Birth: April 24, 1934 Admit Date: 01/02/2012 Admitting Physician Therisa Doyne, MD ZOX:[id number],VWUJ [name], PA-C  Subjective:  No nausea, vomiting or diarrhea.No petechiae or any bleeding  Assessment/Plan: Active Problems:  Thrombocytopenia -severe -spoke with Dr Arline Asp on 11/10-who reviewed her smear and labs, medications, history etc-no schistocytes on smear-no indication of TTP. Given the dramatic drop in over a few days-likely drug related-potential culprits are Vancomycin and Rifampin. I  spoke with Dr Daiva Eves on 11/10-he suggested switching over to Cubicin and to stop Vanco/Rifampin -Transfused 1 unit of Platelets on 11/10-platelet count still 12 K today, will give another unit today -Per Dr Debbra Riding is immune mediated-therefore started on IV Solumedrol on 11/10-continue -no signs of bleeding -spoke to daughter today at bedside  Hyponatremia -suspect SIADH -Fluid restriction -Na up to 131 today  Chest pain  -Resolved  -Likely atypical  -Cardiac enzymes negative, CT a chest negative for pulmonary embolism  MRSA infection of her right prostatic hip-status post irrigation and debridement  -switched to Cubicin (see above) -appreciate ortho input   DIABETES MELLITUS, TYPE II -CBG's stable -c/w SSI-follow closely as now on steroids  Hypertension  -BP controlled without the use of any antihypertensive medications   Dyslipidemia -stop statins-resume when Thrombocytopenia resolves/better  Anemia  -Likely secondary to chronic disease-Hb slightly down to 7.9 today-no signs of bleeding. Monitor -no evidence of schistocytes on smear  Disposition: Remain inpatient  DVT Prophylaxis: SCD's  Code Status: Full code   Procedures:  None  CONSULTS:  ID-phone consult-Dr Daiva Eves  hematology/oncology-Dr Murinson Ortho-Dr Magnus Ivan  PHYSICAL EXAM: Vital signs in last 24 hours: Filed  Vitals:   01/05/12 0230 01/05/12 0453 01/05/12 0845 01/05/12 0926  BP: 122/51 120/80 134/51 111/44  Pulse: 81 89 83 79  Temp: 98.5 F (36.9 C) 98.6 F (37 C) 99.8 F (37.7 C) 98.2 F (36.8 C)  TempSrc: Oral Oral Oral Oral  Resp: 18 20  18   Height:      Weight:      SpO2: 100% 100% 98%     Weight change:  Body mass index is 27.26 kg/(m^2).   Gen Exam: Awake and alert with clear speech.  Neck: Supple, No JVD.   Chest: B/L Clear.   CVS: S1 S2 Regular, no murmurs.  Abdomen: soft, BS +, non tender, non distended.  Extremities: no edema, lower extremities warm to touch. Neurologic: Non Focal.   Skin: No Rash.No petechiae or purpura   Wounds: N/A.    Intake/Output from previous day:  Intake/Output Summary (Last 24 hours) at 01/05/12 1001 Last data filed at 01/05/12 0500  Gross per 24 hour  Intake 698.75 ml  Output    400 ml  Net 298.75 ml     LAB RESULTS: CBC  Lab 01/05/12 0540 01/04/12 1500 01/04/12 0950 01/04/12 0810 01/03/12 0532 01/02/12 1836  WBC 3.9* 5.3 3.7* 3.8* 5.7 --  HGB 7.9* 8.3* 8.4* 8.4* 8.6* --  HCT 23.3* 24.6* 25.3* 25.2* 26.0* --  PLT 12* 17* 10* 12* 66* --  MCV 80.9 81.5 81.1 81.3 80.5 --  MCH 27.4 27.5 26.9 27.1 26.6 --  MCHC 33.9 33.7 33.2 33.3 33.1 --  RDW 14.6 14.9 14.8 15.0 14.7 --  LYMPHSABS -- -- -- -- -- 1.0  MONOABS -- -- -- -- -- 0.7  EOSABS -- -- -- -- -- 0.4  BASOSABS -- -- -- -- -- 0.0  BANDABS -- -- -- -- -- --  Chemistries   Lab 01/05/12 0540 01/04/12 0635 01/03/12 1120 01/03/12 0532 01/03/12 0002 01/02/12 1836  NA 131* 128* 125* 126* -- 121*  K 3.7 4.2 4.4 4.6 -- 4.9  CL 99 97 94* 96 -- 91*  CO2 23 21 21 21  -- 20  GLUCOSE 168* 198* 143* 135* -- 107*  BUN 9 11 12 11  -- 12  CREATININE 1.12* 1.20* 1.31* 1.21* 1.21* --  CALCIUM 7.8* 7.9* 8.2* 8.1* -- 8.5  MG -- -- -- 1.3* -- --    CBG:  Lab 01/05/12 0740 01/04/12 2153 01/04/12 1718 01/04/12 1200 01/04/12 0737  GLUCAP 161* 196* 263* 176* 214*    GFR Estimated  Creatinine Clearance: 39.1 ml/min (by C-G formula based on Cr of 1.12).  Coagulation profile  Lab 01/04/12 0950  INR 1.24  PROTIME --    Cardiac Enzymes  Lab 01/03/12 1120 01/03/12 0532 01/03/12 0002  CKMB -- -- --  TROPONINI <0.30 <0.30 <0.30  MYOGLOBIN -- -- --    No components found with this basename: POCBNP:3  Basename 01/02/12 2328  DDIMER 1.80*   No results found for this basename: HGBA1C:2 in the last 72 hours No results found for this basename: CHOL:2,HDL:2,LDLCALC:2,TRIG:2,CHOLHDL:2,LDLDIRECT:2 in the last 72 hours  Basename 01/03/12 0532  TSH 0.655  T4TOTAL --  T3FREE --  THYROIDAB --   No results found for this basename: VITAMINB12:2,FOLATE:2,FERRITIN:2,TIBC:2,IRON:2,RETICCTPCT:2 in the last 72 hours No results found for this basename: LIPASE:2,AMYLASE:2 in the last 72 hours  Urine Studies No results found for this basename: UACOL:2,UAPR:2,USPG:2,UPH:2,UTP:2,UGL:2,UKET:2,UBIL:2,UHGB:2,UNIT:2,UROB:2,ULEU:2,UEPI:2,UWBC:2,URBC:2,UBAC:2,CAST:2,CRYS:2,UCOM:2,BILUA:2 in the last 72 hours  MICROBIOLOGY: Recent Results (from the past 240 hour(s))  URINE CULTURE     Status: Normal   Collection Time   01/02/12  7:46 PM      Component Value Range Status Comment   Specimen Description URINE, CLEAN CATCH   Final    Special Requests NONE   Final    Culture  Setup Time 01/02/2012 20:44   Final    Colony Count 4,000 COLONIES/ML   Final    Culture INSIGNIFICANT GROWTH   Final    Report Status 01/04/2012 FINAL   Final   CLOSTRIDIUM DIFFICILE BY PCR     Status: Normal   Collection Time   01/03/12 11:27 AM      Component Value Range Status Comment   C difficile by pcr NEGATIVE  NEGATIVE Final     RADIOLOGY STUDIES/RESULTS: Dg Chest 2 View  01/02/2012  *RADIOLOGY REPORT*  Clinical Data: Weakness, shortness of breath  CHEST - 2 VIEW  Comparison: 12/04/2011  Findings: Cardiomediastinal silhouette is stable.  No acute infiltrate or pleural effusion.  No pulmonary edema.   There is a right arm PICC line with tip in right atrium.  For distal SVC position the PICC line should be retracted about 2.5 cm. No diagnostic pneumothorax.  IMPRESSION:  No acute infiltrate or pleural effusion.  No pulmonary edema. There is a right arm PICC line with tip in right atrium.  For distal SVC position the PICC line should be retracted about 2.5 cm. No diagnostic pneumothorax.   Original Report Authenticated By: Natasha Mead, M.D.    Ct Angio Chest Pe W/cm &/or Wo Cm  01/03/2012  *RADIOLOGY REPORT*  Clinical Data: Chest pain, shortness of breath, neck pain and headache.  CT ANGIOGRAPHY CHEST  Technique:  Multidetector CT imaging of the chest using the standard protocol during bolus administration of intravenous contrast. Multiplanar reconstructed images including MIPs were obtained and reviewed to evaluate  the vascular anatomy.  Contrast: OMNIPAQUE IOHEXOL 350 MG/ML SOLN  Comparison: Chest radiograph performed 01/02/2012  Findings: There is no evidence of pulmonary embolus.  Minimal bilateral atelectasis is noted.  There is no evidence of significant focal consolidation, pleural effusion or pneumothorax. No masses are identified; no abnormal focal contrast enhancement is seen.  The mediastinum is unremarkable in appearance.  No mediastinal lymphadenopathy is seen.  No pericardial effusion is identified. The great vessels are grossly unremarkable in appearance.  No axillary lymphadenopathy is seen.  The visualized portions of the thyroid gland are unremarkable in appearance.  The visualized portions of the liver and spleen are unremarkable.  No acute osseous abnormalities are seen.  IMPRESSION:  1.  No evidence of pulmonary embolus. 2.  Minimal bilateral atelectasis noted; lungs otherwise clear.   Original Report Authenticated By: Tonia Ghent, M.D.     MEDICATIONS: Scheduled Meds:    . [COMPLETED] acetaminophen  650 mg Oral Once  . DAPTOmycin (CUBICIN)  IV  400 mg Intravenous Q24H  .  [COMPLETED] diphenhydrAMINE  25 mg Intravenous Once  . [COMPLETED] diphenhydrAMINE  25 mg Intravenous Once  . [COMPLETED] furosemide  20 mg Intravenous Once  . furosemide  20 mg Intravenous Once  . insulin aspart  0-5 Units Subcutaneous QHS  . insulin aspart  0-9 Units Subcutaneous TID WC  . methylPREDNISolone (SOLU-MEDROL) injection  60 mg Intravenous Daily  . sodium chloride  3 mL Intravenous Q12H  . [DISCONTINUED] saccharomyces boulardii  250 mg Oral BID  . [DISCONTINUED] simvastatin  20 mg Oral q1800   Continuous Infusions:  PRN Meds:.acetaminophen, acetaminophen, albuterol, HYDROcodone-acetaminophen, methocarbamol, morphine injection, ondansetron (ZOFRAN) IV, ondansetron, oxyCODONE-acetaminophen, sodium chloride  Antibiotics: Anti-infectives     Start     Dose/Rate Route Frequency Ordered Stop   01/04/12 1015   DAPTOmycin (CUBICIN) 400 mg in sodium chloride 0.9 % IVPB        400 mg 216 mL/hr over 30 Minutes Intravenous Every 24 hours 01/04/12 1002     01/03/12 0100   rifampin (RIFADIN) capsule 300 mg  Status:  Discontinued        300 mg Oral Every 12 hours 01/03/12 0001 01/04/12 0901           Jeoffrey Massed, MD  [facility] Pager:[phone]  If 7PM-7AM, please contact night-coverage [email or web address] Password [email or web address] 01/05/2012, 10:01 AM   LOS: 3 days

## 2012-01-05 NOTE — Progress Notes (Signed)
Md notified that patient is now in first degree heart block according to telemetry readings. There is no documented history found.  EKG ordered and first degree reading on telemetry was not captureable.  EKG read NSR.  Will continue to monitor.

## 2012-01-05 NOTE — Progress Notes (Signed)
Seen and agreed with above  01/05/2012 Fredrich Birks PTA [phone] pager [phone] office

## 2012-01-06 LAB — CBC
HCT: 26.3 % — ABNORMAL LOW (ref 36.0–46.0)
Platelets: 17 10*3/uL — CL (ref 150–400)
RBC: 3.28 MIL/uL — ABNORMAL LOW (ref 3.87–5.11)
RDW: 14.4 % (ref 11.5–15.5)
WBC: 4.9 10*3/uL (ref 4.0–10.5)

## 2012-01-06 LAB — GLUCOSE, CAPILLARY
Glucose-Capillary: 186 mg/dL — ABNORMAL HIGH (ref 70–99)
Glucose-Capillary: 281 mg/dL — ABNORMAL HIGH (ref 70–99)

## 2012-01-06 LAB — BASIC METABOLIC PANEL
CO2: 25 mEq/L (ref 19–32)
Chloride: 99 mEq/L (ref 96–112)
GFR calc Af Amer: 53 mL/min — ABNORMAL LOW (ref >90)
Potassium: 3.6 mEq/L (ref 3.5–5.1)

## 2012-01-06 LAB — PREPARE PLATELET PHERESIS: Unit division: 0

## 2012-01-06 MED ORDER — SODIUM CHLORIDE 0.9 % IV SOLN
500.0000 mg | INTRAVENOUS | Status: DC
  Administered 2012-01-07 – 2012-01-08 (×2): 500 mg via INTRAVENOUS
  Filled 2012-01-06 (×3): qty 10

## 2012-01-06 MED ORDER — DOCUSATE SODIUM 100 MG PO CAPS
100.0000 mg | ORAL_CAPSULE | Freq: Two times a day (BID) | ORAL | Status: DC
  Administered 2012-01-06 – 2012-01-08 (×5): 100 mg via ORAL
  Filled 2012-01-06 (×6): qty 1

## 2012-01-06 NOTE — Progress Notes (Signed)
PATIENT DETAILS Name: Joyce Gilmore Age: 77 y.o. Sex: female Date of Birth: December 14, 1934 Admit Date: 01/02/2012 Admitting Physician Therisa Doyne, MD AVW:UJWJX,BJYN [name], PA-C  Subjective: Patient does not speak English.  Daughter at bedside translating.  Patient requests a stool softener.  No complaints of pain.  Eating well, urinating well, no bowel movement since Saturday, no bleeding.  Assessment/Plan: Active Problems:  Thrombocytopenia -severe.  Platelets up to 17 on 01/06/12. -spoke with Dr Arline Asp on 11/10-who reviewed her smear and labs, medications, history etc-no schistocytes on smear-no indication of TTP. Given the dramatic drop in over a few days-likely drug related-potential culprits are Vancomycin and Rifampin. I  spoke with Dr Daiva Eves on 11/10-he suggested switching over to Cubicin and to stop Vanco/Rifampin -Transfused 1 unit of Platelets on 11/10, a second unit on 11/11, and a third unit on 11/12. -Per Dr Debbra Riding is immune mediated-therefore started on IV Solumedrol on 11/10-continue on oral prednisone. 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  no signs of bleeding. Monitor -no evidence of schistocytes on smear  Disposition: Remain inpatient  DVT Prophylaxis: SCD's  Code Status: Full code   Procedures:  None  CONSULTS:  ID-phone consult-Dr  Daiva Eves  hematology/oncology-Dr Murinson [facility]-Dr Magnus Ivan  PHYSICAL EXAM: Vital signs in last 24 hours: Filed Vitals:   01/05/12 1120 01/05/12 1359 01/05/12 2132 01/06/12 0622  BP: 137/49 149/89 133/75 130/70  Pulse: 70 84 71 73  Temp: 98.3 F (36.8 C) 97.9 F (36.6 C) 98.3 F (36.8 C) 98.4 F (36.9 C)  TempSrc: Oral Oral  Oral  Resp: 18 18 18 17   Height:      Weight:      SpO2:  98% 98% 97%    Weight change:  Body mass index is 27.26 kg/(m^2).   Gen Exam: Awake and alert, NAD Neck: Supple, No JVD.   Chest: B/L Clear, no W/C/r CVS: RRR no M/R/G  Abdomen: soft, BS +, non tender, non distended.  Extremities: no edema, lower extremities warm to touch. Neurologic: Non Focal.   Skin: No Rash.No petechiae or purpura.  Small bruise on abdomen and left arm. Wounds: N/A.    Intake/Output from previous day:  Intake/Output Summary (Last 24 hours) at 01/06/12 1146 Last data filed at 01/05/12 1800  Gross per 24 hour  Intake    216 ml  Output      0 ml  Net    216 ml     LAB RESULTS: CBC  Lab 01/06/12 0500 01/05/12 1505 01/05/12 0540 01/04/12 1500 01/04/12 0950 01/02/12 1836  WBC 4.9 5.0 3.9* 5.3 3.7* --  HGB 8.6* 8.5* 7.9* 8.3* 8.4* --  HCT 26.3* 25.7* 23.3* 24.6* 25.3* --  PLT 17* 15* 12* 17* 10* --  MCV 80.2 80.6 80.9 81.5 81.1 --  MCH 26.2 26.6 27.4 27.5 26.9 --  MCHC 32.7 33.1 33.9 33.7 33.2 --  RDW 14.4 14.4 14.6 14.9 14.8 --  LYMPHSABS -- -- -- -- -- 1.0  MONOABS -- -- -- -- --  0.7  EOSABS -- -- -- -- -- 0.4  BASOSABS -- -- -- -- -- 0.0  BANDABS -- -- -- -- -- --    Chemistries   Lab 01/06/12 0500 01/05/12 0540 01/04/12 0635 01/03/12 1120 01/03/12 0532  NA 134* 131* 128* 125* 126*  K 3.6 3.7 4.2 4.4 4.6  CL 99 99 97 94* 96  CO2 25 23 21 21 21   GLUCOSE 191* 168* 198* 143* 135*  BUN 13 9 11 12 11   CREATININE 1.14* 1.12* 1.20* 1.31* 1.21*  CALCIUM 8.4 7.8* 7.9* 8.2* 8.1*  MG -- -- -- -- 1.3*    CBG:  Lab 01/06/12 0815 01/05/12 2125 01/05/12 1755  01/05/12 1235 01/05/12 0740  GLUCAP 186* 219* 246* 215* 161*    GFR Estimated Creatinine Clearance: 38.4 ml/min (by C-G formula based on Cr of 1.14).  Coagulation profile  Lab 01/04/12 0950  INR 1.24  PROTIME --    Cardiac Enzymes  Lab 01/03/12 1120 01/03/12 0532 01/03/12 0002  CKMB -- -- --  TROPONINI <0.30 <0.30 <0.30  MYOGLOBIN -- -- --    MICROBIOLOGY: Recent Results (from the past 240 hour(s))  URINE CULTURE     Status: Normal   Collection Time   01/02/12  7:46 PM      Component Value Range Status Comment   Specimen Description URINE, CLEAN CATCH   Final    Special Requests NONE   Final    Culture  Setup Time 01/02/2012 20:44   Final    Colony Count 4,000 COLONIES/ML   Final    Culture INSIGNIFICANT GROWTH   Final    Report Status 01/04/2012 FINAL   Final   CLOSTRIDIUM DIFFICILE BY PCR     Status: Normal   Collection Time   01/03/12 11:27 AM      Component Value Range Status Comment   C difficile by pcr NEGATIVE  NEGATIVE Final     RADIOLOGY STUDIES/RESULTS: Dg Chest 2 View  01/02/2012  *RADIOLOGY REPORT*  Clinical Data: Weakness, shortness of breath  CHEST - 2 VIEW  Comparison: 12/04/2011  Findings: Cardiomediastinal silhouette is stable.  No acute infiltrate or pleural effusion.  No pulmonary edema.  There is a right arm PICC line with tip in right atrium.  For distal SVC position the PICC line should be retracted about 2.5 cm. No diagnostic pneumothorax.  IMPRESSION:  No acute infiltrate or pleural effusion.  No pulmonary edema. There is a right arm PICC line with tip in right atrium.  For distal SVC position the PICC line should be retracted about 2.5 cm. No diagnostic pneumothorax.   Original Report Authenticated By: Natasha Mead, M.D.    Ct Angio Chest Pe W/cm &/or Wo Cm  01/03/2012  *RADIOLOGY REPORT*  Clinical Data: Chest pain, shortness of breath, neck pain and headache.  CT ANGIOGRAPHY CHEST  Technique:  Multidetector CT imaging of the chest using the standard  protocol during bolus administration of intravenous contrast. Multiplanar reconstructed images including MIPs were obtained and reviewed to evaluate the vascular anatomy.  Contrast: OMNIPAQUE IOHEXOL 350 MG/ML SOLN  Comparison: Chest radiograph performed 01/02/2012  Findings: There is no evidence of pulmonary embolus.  Minimal bilateral atelectasis is noted.  There is no evidence of significant focal consolidation, pleural effusion or pneumothorax. No masses are identified; no abnormal focal contrast enhancement is seen.  The mediastinum is unremarkable in appearance.  No mediastinal lymphadenopathy is seen.  No pericardial effusion is identified. The great vessels are grossly unremarkable  in appearance.  No axillary lymphadenopathy is seen.  The visualized portions of the thyroid gland are unremarkable in appearance.  The visualized portions of the liver and spleen are unremarkable.  No acute osseous abnormalities are seen.  IMPRESSION:  1.  No evidence of pulmonary embolus. 2.  Minimal bilateral atelectasis noted; lungs otherwise clear.   Original Report Authenticated By: Tonia Ghent, M.D.     MEDICATIONS: Scheduled Meds:    . DAPTOmycin (CUBICIN)  IV  400 mg Intravenous Q24H  . docusate sodium  100 mg Oral BID  . [COMPLETED] furosemide  20 mg Intravenous Once  . insulin aspart  0-15 Units Subcutaneous TID WC  . insulin aspart  0-5 Units Subcutaneous QHS  . methylPREDNISolone (SOLU-MEDROL) injection  60 mg Intravenous Daily  . sodium chloride  3 mL Intravenous Q12H  . [DISCONTINUED] insulin aspart  0-5 Units Subcutaneous QHS  . [DISCONTINUED] insulin aspart  0-9 Units Subcutaneous TID WC   Continuous Infusions:  PRN Meds:.acetaminophen, acetaminophen, albuterol, HYDROcodone-acetaminophen, methocarbamol, morphine injection, ondansetron (ZOFRAN) IV, ondansetron, oxyCODONE-acetaminophen, sodium chloride  Antibiotics: Anti-infectives     Start     Dose/Rate Route Frequency Ordered Stop    01/04/12 1015   DAPTOmycin (CUBICIN) 400 mg in sodium chloride 0.9 % IVPB        400 mg 216 mL/hr over 30 Minutes Intravenous Every 24 hours 01/04/12 1002     01/03/12 0100   rifampin (RIFADIN) capsule 300 mg  Status:  Discontinued        300 mg Oral Every 12 hours 01/03/12 0001 01/04/12 0901           [place], Streicher [facility] Pager:[phone]  If 7PM-7AM, please contact night-coverage [email or web address] Password [facility] 01/06/2012, 11:46 AM   LOS: 4 days    Attending Patient seen and examined, a platelet counts are slightly up to 17,000 today. She has no evidence of bleeding on exam. We'll continue with supportive care and hopefully with rebound soon.  S Jeylin Woodmansee

## 2012-01-06 NOTE — Progress Notes (Signed)
[facility]    Date of Admission:  01/02/2012   Total days of antibiotics 9        Day 3 daptomycin        - previously on vanco/rif s/p 10/2 I X D   ID: Joyce Gilmore is a 77 y.o. 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 Vanco and rifampin both stopped.    Principal Problem:  *Thrombocytopenia Active Problems:  DIABETES MELLITUS, TYPE II  HYPERTENSION  Hyponatremia  Infection of right prosthetic hip joint  Chest pain   Subjective: No episodes of bleeding yesterday. No epistaxis. No blood in stool or bloody gums. . She has tolerated plt transfusions without difficulty  24hr event: had platelet transfusion but no significant response  Medications:     . DAPTOmycin (CUBICIN)  IV  400 mg Intravenous Q24H  . docusate sodium  100 mg Oral BID  . insulin aspart  0-15 Units Subcutaneous TID WC  . insulin aspart  0-5 Units Subcutaneous QHS  . methylPREDNISolone (SOLU-MEDROL) injection  60 mg Intravenous Daily  . sodium chloride  3 mL Intravenous Q12H  . [DISCONTINUED] insulin aspart  0-5 Units Subcutaneous QHS  . [DISCONTINUED] insulin aspart  0-9 Units Subcutaneous TID WC    Objective: Vital signs in last 24 hours: Temp:  [97.9 F (36.6 C)-98.4 F (36.9 C)] 98.4 F (36.9 C) (11/12 0622) Pulse Rate:  [71-84] 73  (11/12 0622) Resp:  [17-18] 17  (11/12 0622) BP: (130-149)/(70-89) 130/70 mmHg (11/12 0622) SpO2:  [97 %-98 %] 97 % (11/12 0622) Physical Exam  Constitutional: oriented to person, place, and time.  appears well-developed and well-nourished. No distress.  HENT:  Mouth/Throat: Oropharynx is clear and moist. No oropharyngeal exudate.  Cardiovascular: Normal rate, regular rhythm  and normal heart sounds. Exam reveals no gallop and no friction rub.  No murmur heard.  Pulmonary/Chest: Effort normal and breath sounds normal. No respiratory distress. He has no wheezes.  Abdominal: Soft. Bowel sounds are normal. He exhibits no distension. There is no tenderness.  Lymphadenopathy:  no cervical adenopathy.  Skin: Skin is warm and dry. Right hip surgical incision site not necessarily erythematous but has 2 points along incision site that has serosanginous drainage. Not indurated, nontender. No warmth.  Lab Results  Basename 01/06/12 0500 01/05/12 1505 01/05/12 0540  WBC 4.9 5.0 --  HGB 8.6* 8.5* --  HCT 26.3* 25.7* --  NA 134* -- 131*  K 3.6 -- 3.7  CL 99 -- 99  CO2 25 -- 23  BUN 13 -- 9  CREATININE 1.14* -- 1.12*  GLU -- -- --   Ck 28 on 11/11  Microbiology: 10/28 MRSA ( vanco 1, bactrim <20 S, rifampin S, tetra S) Studies/Results: No results found.   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 Vanco and rifampin both stopped. No improvement with platelet transfusions.  1) MRSA prosthetic joint infection = continue with daptomycin, will increase to 500mg  to be closer to 8mg /kg dosing for MRSA infection. Continue with  weekly CK checks  2) thrombocytopenia = thought to be related to rifampin use. Continue with supportive care, steroids. recs per hematology. No signs of active bleeding presently.still not showing improvement yet. Anticipate an addn plt tsf toay  3) supratherapeutic vancomycin = presumably clearing sufficiently, not received any addn doses in last 3 days. Last checked on 11/10 supratherapeutic at 25  Joyce Gilmore B. Drue Second MD MPH [facility] [phone]      Drue Second [facility] Cell: [phone] Pager: [phone]  01/06/2012, 12:46 PM

## 2012-01-06 NOTE — Progress Notes (Signed)
Patient tolerated Platelet infusion well.Daughter at bedside

## 2012-01-06 NOTE — Progress Notes (Signed)
ANTIBIOTIC CONSULT NOTE - F/U  Pharmacy Consult for Daptomycin  Indication: Infected hip implant  Allergies  Allergen Reactions  . Rifampin Other (See Comments)    Severe thrombocytopenia  . Vancomycin Other (See Comments)    Severe thrombocytopenia     Vital Signs: Temp: 98.4 F (36.9 C) (11/12 0622) Temp src: Oral (11/12 0622) BP: 130/70 mmHg (11/12 0622) Pulse Rate: 73  (11/12 0622)  Labs:  Basename 01/06/12 0500 01/05/12 1505 01/05/12 0540 01/04/12 0635  WBC 4.9 5.0 3.9* --  HGB 8.6* 8.5* 7.9* --  PLT 17* 15* 12* --  LABCREA -- -- -- --  CREATININE 1.14* -- 1.12* 1.20*    Microbiology: Recent Results (from the past 720 hour(s))  URINE CULTURE     Status: Normal   Collection Time   12/22/11  3:12 PM      Component Value Range Status Comment   Specimen Description URINE, RANDOM   Final    Special Requests NONE   Final    Culture  Setup Time 12/22/2011 17:28   Final    Colony Count >=100,000 COLONIES/ML   Final    Culture ESCHERICHIA COLI   Final    Report Status 12/24/2011 FINAL   Final    Organism ID, Bacteria ESCHERICHIA COLI   Final   SURGICAL PCR SCREEN     Status: Normal   Collection Time   12/22/11  4:25 PM      Component Value Range Status Comment   MRSA, PCR NEGATIVE  NEGATIVE Final    Staphylococcus aureus NEGATIVE  NEGATIVE Final   CULTURE, ROUTINE-ABSCESS     Status: Normal   Collection Time   12/22/11  6:03 PM      Component Value Range Status Comment   Specimen Description ABSCESS HIP RIGHT   Final    Special Requests SUPERFICIAL   Final    Gram Stain     Final    Value: FEW WBC PRESENT,BOTH PMN AND MONONUCLEAR     RARE GRAM POSITIVE COCCI     IN PAIRS Performed at [facility]   Culture     Final    Value: MODERATE METHICILLIN RESISTANT STAPHYLOCOCCUS AUREUS     Note: RIFAMPIN AND GENTAMICIN SHOULD NOT BE USED AS SINGLE DRUGS FOR TREATMENT OF STAPH INFECTIONS. CRITICAL RESULT CALLED TO, READ BACK BY AND VERIFIED WITH: CAROL S@[id number]   ON [id number] BY [facility]   Report Status 12/25/2011 FINAL   Final    Organism ID, Bacteria METHICILLIN RESISTANT STAPHYLOCOCCUS AUREUS   Final   ANAEROBIC CULTURE     Status: Normal   Collection Time   12/22/11  6:03 PM      Component Value Range Status Comment   Specimen Description ABSCESS HIP RIGHT   Final    Special Requests SUPERFICIAL   Final    Gram Stain     Final    Value: FEW WBC PRESENT,BOTH PMN AND MONONUCLEAR     RARE GRAM POSITIVE COCCI     IN PAIRS Performed at [facility]   Culture NO ANAEROBES ISOLATED   Final    Report Status 12/27/2011 FINAL   Final   GRAM STAIN     Status: Normal   Collection Time   12/22/11  6:03 PM      Component Value Range Status Comment   Specimen Description ABSCESS HIP RIGHT   Final    Special Requests SUPERFICIAL   Final    Gram Stain  Final    Value: FEW WBC PRESENT,BOTH PMN AND MONONUCLEAR     RARE GRAM POSITIVE COCCI IN PAIRS   Report Status 12/22/2011 FINAL   Final   CULTURE, ROUTINE-ABSCESS     Status: Normal   Collection Time   12/22/11  6:20 PM      Component Value Range Status Comment   Specimen Description ABSCESS HIP RIGHT   Final    Special Requests DEEP   Final    Gram Stain     Final    Value: RARE WBC PRESENT,BOTH PMN AND MONONUCLEAR     NO ORGANISMS SEEN     Performed at [facility]   Culture     Final    Value: MODERATE METHICILLIN RESISTANT STAPHYLOCOCCUS AUREUS     Note: RIFAMPIN AND GENTAMICIN SHOULD NOT BE USED AS SINGLE DRUGS FOR TREATMENT OF STAPH INFECTIONS. CRITICAL RESULT CALLED TO, READ BACK BY AND VERIFIED WITH: CAROL S@[id number]  ON [id number] BY [facility]   Report Status 12/25/2011 FINAL   Final    Organism ID, Bacteria METHICILLIN RESISTANT STAPHYLOCOCCUS AUREUS   Final   ANAEROBIC CULTURE     Status: Normal   Collection Time   12/22/11  6:20 PM      Component Value Range Status Comment   Specimen Description ABSCESS HIP RIGHT   Final    Special Requests DEEP   Final    Gram Stain     Final     Value: RARE WBC PRESENT,BOTH PMN AND MONONUCLEAR     NO ORGANISMS SEEN     Performed at [facility]   Culture NO ANAEROBES ISOLATED   Final    Report Status 12/27/2011 FINAL   Final   GRAM STAIN     Status: Normal   Collection Time   12/22/11  6:20 PM      Component Value Range Status Comment   Specimen Description ABSCESS HIP RIGHT   Final    Special Requests DEEP   Final    Gram Stain     Final    Value: RARE WBC PRESENT,BOTH PMN AND MONONUCLEAR     NO ORGANISMS SEEN   Report Status 12/22/2011 FINAL   Final   URINE CULTURE     Status: Normal   Collection Time   01/02/12  7:46 PM      Component Value Range Status Comment   Specimen Description URINE, CLEAN CATCH   Final    Special Requests NONE   Final    Culture  Setup Time 01/02/2012 20:44   Final    Colony Count 4,000 COLONIES/ML   Final    Culture INSIGNIFICANT GROWTH   Final    Report Status 01/04/2012 FINAL   Final   CLOSTRIDIUM DIFFICILE BY PCR     Status: Normal   Collection Time   01/03/12 11:27 AM      Component Value Range Status Comment   C difficile by pcr NEGATIVE  NEGATIVE Final     Medical History: Past Medical History  Diagnosis Date  . Hypertension   . Depression     hx of   . Diabetes mellitus without complication   . Chronic kidney disease     hx of kidney stones,   . Arthritis     Medications:  Prescriptions prior to admission  Medication Sig Dispense Refill  . aspirin EC 325 MG EC tablet Take 1 tablet (325 mg total) by mouth daily.  30 tablet  0  .  calcium-vitamin D (OSCAL WITH D) 500-200 MG-UNIT per tablet Take 1 tablet by mouth daily.      . Cholecalciferol (VITAMIN D-400 PO) Take 400 Units by mouth daily.      Marland Kitchen glipiZIDE (GLUCOTROL XL) 5 MG 24 hr tablet Take 5 mg by mouth daily.      Marland Kitchen lisinopril (PRINIVIL,ZESTRIL) 40 MG tablet Take 40 mg by mouth every morning.       . metFORMIN (GLUCOPHAGE) 1000 MG tablet Take 1,000 mg by mouth 2 (two) times daily with a meal.      .  methocarbamol (ROBAXIN) 500 MG tablet Take 1 tablet (500 mg total) by mouth every 6 (six) hours as needed.  60 tablet  0  . ondansetron (ZOFRAN) 4 MG tablet Take 4 mg by mouth every 8 (eight) hours as needed. For nausea      . oxyCODONE-acetaminophen (PERCOCET/ROXICET) 5-325 MG per tablet Take 1 tablet by mouth every 8 (eight) hours as needed. For pain      . pravastatin (PRAVACHOL) 40 MG tablet Take 40 mg by mouth at bedtime.       . rifampin (RIFADIN) 300 MG capsule Take 1 capsule (300 mg total) by mouth every 12 (twelve) hours.  90 capsule  1  . sitaGLIPtin (JANUVIA) 100 MG tablet Take 100 mg by mouth every morning.       . sodium chloride 0.9 % SOLN 500 mL with vancomycin 1000 MG SOLR 1,500 mg Inject 1,500 mg into the vein daily.  4000 mL  1   Scheduled:     . DAPTOmycin (CUBICIN)  IV  400 mg Intravenous Q24H  . docusate sodium  100 mg Oral BID  . insulin aspart  0-15 Units Subcutaneous TID WC  . insulin aspart  0-5 Units Subcutaneous QHS  . methylPREDNISolone (SOLU-MEDROL) injection  60 mg Intravenous Daily  . sodium chloride  3 mL Intravenous Q12H  . [DISCONTINUED] insulin aspart  0-5 Units Subcutaneous QHS  . [DISCONTINUED] insulin aspart  0-9 Units Subcutaneous TID WC    Assessment: 77yo female was recently discharged with PICC for 6 weeks of ABX for MRSA infection of hip s/p right hip revision arthroplasty, now c/o generalized weakness, HA, N/V/D, and decreased PO intake, found to be hyponatremic with mild ARF, thought to be due to dehydration vs vanc rxn vs combo  Vancomycin trough ordered on 11/9 came back at a level of  37.4 (goal 15-20 mcg/ml). Patient has developed severe thrombocytopenia requiring transfusions. This is thought to potentially be an adverse reaction to vancomycin or rifampin. She was switched to Daptomycin 6mg /kg on 11/10. Dr. Drue Second felt that a higher dose may be necessary to due to the difficulty in clearing infected hardware.  Goal of Therapy:  Eradication  of infection   Plan:  1) Daptomycin 8mg /kg/day (500 mg/day) 2) Monitor weekly CPK.   Joyce Gilmore. Darin Engels.D. Clinical Pharmacist Pager [phone] Phone [phone] 01/06/2012 1:39 PM

## 2012-01-07 DIAGNOSIS — I10 Essential (primary) hypertension: Secondary | ICD-10-CM

## 2012-01-07 DIAGNOSIS — D696 Thrombocytopenia, unspecified: Secondary | ICD-10-CM

## 2012-01-07 LAB — BASIC METABOLIC PANEL
BUN: 15 mg/dL (ref 6–23)
CO2: 24 mEq/L (ref 19–32)
Calcium: 8.1 mg/dL — ABNORMAL LOW (ref 8.4–10.5)
Glucose, Bld: 240 mg/dL — ABNORMAL HIGH (ref 70–99)
Sodium: 135 mEq/L (ref 135–145)

## 2012-01-07 LAB — GLUCOSE, CAPILLARY
Glucose-Capillary: 225 mg/dL — ABNORMAL HIGH (ref 70–99)
Glucose-Capillary: 390 mg/dL — ABNORMAL HIGH (ref 70–99)
Glucose-Capillary: 118 mg/dL — ABNORMAL HIGH (ref 70–99)

## 2012-01-07 LAB — HEPARIN INDUCED THROMBOCYTOPENIA PNL
Heparin Induced Plt Ab: POSITIVE
UFH SRA Result: NEGATIVE

## 2012-01-07 LAB — PREPARE PLATELET PHERESIS

## 2012-01-07 LAB — CBC
HCT: 24 % — ABNORMAL LOW (ref 36.0–46.0)
Hemoglobin: 8 g/dL — ABNORMAL LOW (ref 12.0–15.0)
MCH: 26.7 pg (ref 26.0–34.0)
MCV: 80 fL (ref 78.0–100.0)
RBC: 3 MIL/uL — ABNORMAL LOW (ref 3.87–5.11)

## 2012-01-07 MED ORDER — PREDNISONE 10 MG PO TABS: ORAL_TABLET | ORAL | Status: DC

## 2012-01-07 MED ORDER — METFORMIN HCL 500 MG PO TABS
1000.0000 mg | ORAL_TABLET | Freq: Two times a day (BID) | ORAL | Status: DC
  Administered 2012-01-07 – 2012-01-08 (×2): 1000 mg via ORAL
  Filled 2012-01-07 (×4): qty 2

## 2012-01-07 MED ORDER — SODIUM CHLORIDE 0.9 % IV SOLN: 500.0000 mg | INTRAVENOUS | Status: DC

## 2012-01-07 MED ORDER — GLIPIZIDE 5 MG PO TABS
5.0000 mg | ORAL_TABLET | Freq: Every day | ORAL | Status: DC
  Administered 2012-01-07 – 2012-01-08 (×2): 5 mg via ORAL
  Filled 2012-01-07 (×3): qty 1

## 2012-01-07 MED ORDER — BISACODYL 5 MG PO TBEC: 10.0000 mg | DELAYED_RELEASE_TABLET | Freq: Once | ORAL | Status: DC

## 2012-01-07 MED ORDER — PREDNISONE 50 MG PO TABS
50.0000 mg | ORAL_TABLET | Freq: Every day | ORAL | Status: DC
  Administered 2012-01-08: 50 mg via ORAL
  Filled 2012-01-07 (×4): qty 1

## 2012-01-07 NOTE — Progress Notes (Signed)
Addendum  Patient seen and examined, chart and data base reviewed.  I agree with the above assessment and plan.  For full details please see Mrs. Algis Downs PA. Note.  MRSA Septic Arthritis with retained hardware. Thrombocytopenia is improving post transfusion.   Clint Lipps, MD [facility] Pager: [phone] 01/07/2012, 1:55 PM

## 2012-01-07 NOTE — Discharge Summary (Signed)
Physician Discharge Summary  Joyce Gilmore ZOX:[id number] DOB: September 08, 1934 DOA: 01/02/2012  PCP: Frazier Richards, PA-C  Admit date: 01/02/2012 Discharge date: 01/08/2012  Time spent: 40 minutes  Recommendations for Outpatient Follow-up:  1. Twice weekly CBC until platelet count is above 100,000 2. Once weekly CK while patient is on daptomycin, next due 11/18. (though 12/14) 3. Wound care with twice a day bandage changes to hip. 4. Physical Therapy. 5. Follow up with Dr. Magnus Ivan 6.  Tight diabetic management as directed by primary care physician.  Discharge Diagnoses:  Principal Problem:  *Thrombocytopenia Active Problems:  DIABETES MELLITUS, TYPE II  HYPERTENSION  Hyponatremia  Infection of right prosthetic hip joint  Chest pain   Discharge Condition: appears well, no bleeding, eating, ambulating, stable.  Diet recommendation: carb modified  Filed Weights   01/03/12 0032 01/04/12 1500  Weight: 67.6 kg (149 lb 0.5 oz) 67.6 kg (149 lb 0.5 oz)    History of present illness:  Joyce Gilmore is a 77 y.o. female who has a past medical history of Hypertension; Depression; Diabetes mellitus without complication; Arthritis; and Chronic kidney disease. The patient has been recently admitted for MRSA infection of her right prosthetic hip joint she have undergone irrigation and debridement of the hip. Done by Dr. Magnus Ivan. She was discharged to home on IV vancomycin the PICC line and rifampin on November 4. Her family has been administered vancomycin and has been stating that she had been having some nausea and vomiting associated with getting the medications this been going on for about a week now. She's been having some nonspecific chest pain which he can't quite elaborate on. Patient is not eating well. No fever no chills. She has been having Occasional diarrhea (She has 3-4 BM a day). Occasional coughing. Also she had had some headache and neck pain although no  meningismus neck has been supple. There is no increased pain or discharge at the surgical site. Family states that she had had some worsening shortness of breath. She seems to run out of breath when she is speaking. On admission her platelets were 101.  They quickly dropped to 10 over a 3 day period.  This was felt to be an immune response to her antibiotics.  Hospital Course:   Thrombocytopenia  Her thrombocytopenia was severe.  Platelets dropped to a low of 10,000.  She received transfusion of 3 units of platelets of the course of 3 days 11/10 - 11/12.  We spoke with Dr Arline Asp on 11/10-who reviewed her smear and labs, medications, history etc-no schistocytes on smear-no indication of TTP. Given the dramatic drop in over a few days-likely drug related-potential culprits are Vancomycin and Rifampin. I spoke with Dr Daiva Eves on 11/10.  He suggested switching over to Cubicin and to stop Vanco/Rifampin.  Per Dr Arline Asp, this is immune mediated.  Consequently the patient was started on IV Solumedrol on 11/10, and transitioned to oral prednisone taper starting 11/14.   She did not have any active bleeding during this hospitalization.    Hyponatremia  She was admitted with hyponatremia.  Felt to be due to SIADH.  She was treated with fluid restriction and this has resolved.  Chest pain  Resolved shortly after admission.  Likely atypical.  Cardiac enzymes negative, CT a chest negative for pulmonary embolism  MRSA infection of her right prostatic hip-status post irrigation and debridement  Switched to Cubicin (see above), follow by orthopedic surgery.  BID dressing changes.  Will have home health RN and  see Dr. Magnus Ivan in the office.  DIABETES MELLITUS, TYPE II  CBG's were somewhat labile in the hospital but they were monitored closely and treated with sliding scale insulin.  At this point we have restarted her oral medications, but still expect her blood sugar to be somewhat elevated out patient due to  her steroid taper.  We recommend close follow up with her PCP for diabetic control to aid in resolution of her infection and wound healing.  Hypertension       BP controlled without the use of any antihypertensive medications   Dyslipidemia       Stop statins-resume when Thrombocytopenia resolves/better  Anemia  Likely secondary to chronic disease. no signs of bleeding. Monitor.  no evidence of schistocytes on smear.  Will start iron supplements at discharge.     Consultations:  Orthopedic Surgery  Infectious Disease  Discharge Exam: Filed Vitals:   01/07/12 0609 01/07/12 1450 01/07/12 2141 01/08/12 0622  BP: 131/68 158/66 138/56 136/40  Pulse: 78 71 71 73  Temp: 98 F (36.7 C) 98.1 F (36.7 C) 98.2 F (36.8 C) 98 F (36.7 C)  TempSrc: Oral Oral Oral Oral  Resp: 20 20 16 16   Height:      Weight:      SpO2: 99% 100% 100% 97%    General: Alert, Appears well, smiling Cardiovascular: RRR no M/R/G Respiratory: CTA no W/C/R Abdomen:  Soft, NT, ND, +BS  Discharge Instructions      Discharge Orders    Future Appointments: Provider: Department: Dept Phone: Center:   01/14/2012 8:30 AM Beverley Fiedler, MD [facility] [phone] [facility]   02/03/2012 11:15 AM Gardiner Barefoot, MD [facility] [id number] [facility]       Medication List     As of 01/08/2012 12:17 PM    STOP taking these medications         aspirin 325 MG EC tablet      pravastatin 40 MG tablet   Commonly known as: PRAVACHOL      rifampin 300 MG capsule   Commonly known as: RIFADIN      sodium chloride 0.9 % SOLN 500 mL with vancomycin 1000 MG SOLR 1,500 mg      TAKE these medications         calcium-vitamin D 500-200 MG-UNIT per tablet   Commonly known as: OSCAL WITH D   Take 1 tablet by mouth daily.      ferrous sulfate 325 (65 FE) MG tablet   Take 1 tablet (325 mg total) by mouth 2 (two) times daily with a meal.      glipiZIDE 5  MG 24 hr tablet   Commonly known as: GLUCOTROL XL   Take 5 mg by mouth daily.      lisinopril 40 MG tablet   Commonly known as: PRINIVIL,ZESTRIL   Take 40 mg by mouth every morning.      metFORMIN 1000 MG tablet   Commonly known as: GLUCOPHAGE   Take 1,000 mg by mouth 2 (two) times daily with a meal.      methocarbamol 500 MG tablet   Commonly known as: ROBAXIN   Take 1 tablet (500 mg total) by mouth every 6 (six) hours as needed.      ondansetron 4 MG tablet   Commonly known as: ZOFRAN   Take 4 mg by mouth every 8 (eight) hours as needed. For nausea      oxyCODONE-acetaminophen 5-325  MG per tablet   Commonly known as: PERCOCET/ROXICET   Take 1 tablet by mouth every 8 (eight) hours as needed. For pain      predniSONE 10 MG tablet   Commonly known as: DELTASONE   Taper:  Take 4 tabs on 11/15  Take 3 tabs on 11/16  Take 2 tabs on 11/17  Take 1 tab on 11/18  Take 1/2 tab on 11/19  Take 1/2 tab on 11/20      sitaGLIPtin 100 MG tablet   Commonly known as: JANUVIA   Take 100 mg by mouth every morning.      sodium chloride 0.9 % SOLN 100 mL with DAPTOmycin 500 MG SOLR 500 mg   Inject 500 mg into the vein daily.      VITAMIN D-400 PO   Take 400 Units by mouth daily.         Follow-up Information    Follow up with Kathryne Hitch, MD. Schedule an appointment as soon as possible for a visit in 1 week.   Contact information:   [address] [place] [address] [phone]       Follow up with [facility] [name], PA-C. Schedule an appointment as soon as possible for a visit in 2 weeks.   Contact information:   [address] [place] [address] [phone]           The results of significant diagnostics from this hospitalization (including imaging, microbiology, ancillary and laboratory) are listed below for reference.    Significant Diagnostic Studies: Dg Chest 2 View  01/02/2012  *RADIOLOGY REPORT*  Clinical Data: Weakness,  shortness of breath  CHEST - 2 VIEW  Comparison: 12/04/2011  Findings: Cardiomediastinal silhouette is stable.  No acute infiltrate or pleural effusion.  No pulmonary edema.  There is a right arm PICC line with tip in right atrium.  For distal SVC position the PICC line should be retracted about 2.5 cm. No diagnostic pneumothorax.  IMPRESSION:  No acute infiltrate or pleural effusion.  No pulmonary edema. There is a right arm PICC line with tip in right atrium.  For distal SVC position the PICC line should be retracted about 2.5 cm. No diagnostic pneumothorax.   Original Report Authenticated By: Natasha Mead, M.D.    Ct Angio Chest Pe W/cm &/or Wo Cm  01/03/2012  *RADIOLOGY REPORT*  Clinical Data: Chest pain, shortness of breath, neck pain and headache.  CT ANGIOGRAPHY CHEST  Technique:  Multidetector CT imaging of the chest using the standard protocol during bolus administration of intravenous contrast. Multiplanar reconstructed images including MIPs were obtained and reviewed to evaluate the vascular anatomy.  Contrast: OMNIPAQUE IOHEXOL 350 MG/ML SOLN  Comparison: Chest radiograph performed 01/02/2012  Findings: There is no evidence of pulmonary embolus.  Minimal bilateral atelectasis is noted.  There is no evidence of significant focal consolidation, pleural effusion or pneumothorax. No masses are identified; no abnormal focal contrast enhancement is seen.  The mediastinum is unremarkable in appearance.  No mediastinal lymphadenopathy is seen.  No pericardial effusion is identified. The great vessels are grossly unremarkable in appearance.  No axillary lymphadenopathy is seen.  The visualized portions of the thyroid gland are unremarkable in appearance.  The visualized portions of the liver and spleen are unremarkable.  No acute osseous abnormalities are seen.  IMPRESSION:  1.  No evidence of pulmonary embolus. 2.  Minimal bilateral atelectasis noted; lungs otherwise clear.   Original Report Authenticated  By: Tonia Ghent,  M.D.     Microbiology: Recent Results (from the past 240 hour(s))  URINE CULTURE     Status: Normal   Collection Time   01/02/12  7:46 PM      Component Value Range Status Comment   Specimen Description URINE, CLEAN CATCH   Final    Special Requests NONE   Final    Culture  Setup Time 01/02/2012 20:44   Final    Colony Count 4,000 COLONIES/ML   Final    Culture INSIGNIFICANT GROWTH   Final    Report Status 01/04/2012 FINAL   Final   CLOSTRIDIUM DIFFICILE BY PCR     Status: Normal   Collection Time   01/03/12 11:27 AM      Component Value Range Status Comment   C difficile by pcr NEGATIVE  NEGATIVE Final      Labs: Basic Metabolic Panel:  Lab 01/07/12 1610 01/06/12 0500 01/05/12 0540 01/04/12 0635 01/03/12 1120 01/03/12 0532  NA 135 134* 131* 128* 125* --  K 4.0 3.6 3.7 4.2 4.4 --  CL 100 99 99 97 94* --  CO2 24 25 23 21 21  --  GLUCOSE 240* 191* 168* 198* 143* --  BUN 15 13 9 11 12  --  CREATININE 1.19* 1.14* 1.12* 1.20* 1.31* --  CALCIUM 8.1* 8.4 7.8* 7.9* 8.2* --  MG -- -- -- -- -- 1.3*  PHOS -- -- -- -- -- 2.5   Liver Function Tests:  Lab 01/03/12 0532  AST 20  ALT 10  ALKPHOS 62  BILITOT 0.3  PROT 5.5*  ALBUMIN 2.4*   No results found for this basename: LIPASE:5,AMYLASE:5 in the last 168 hours No results found for this basename: AMMONIA:5 in the last 168 hours CBC:  Lab 01/08/12 0548 01/07/12 0440 01/06/12 0500 01/05/12 1505 01/05/12 0540 01/02/12 1836  WBC 6.5 5.0 4.9 5.0 3.9* --  NEUTROABS -- -- -- -- -- 4.9  HGB 8.3* 8.0* 8.6* 8.5* 7.9* --  HCT 24.8* 24.0* 26.3* 25.7* 23.3* --  MCV 80.0 80.0 80.2 80.6 80.9 --  PLT 41* 27* 17* 15* 12* --   Cardiac Enzymes:  Lab 01/05/12 1430 01/03/12 1120 01/03/12 0532 01/03/12 0002  CKTOTAL 28 -- -- --  CKMB -- -- -- --  CKMBINDEX -- -- -- --  TROPONINI -- <0.30 <0.30 <0.30   CBG:  Lab 01/08/12 1150 01/08/12 0749 01/07/12 2143 01/07/12 1729 01/07/12 1137  GLUCAP 278* 231* 118* 503* 390*        Signed:  Briyelle, Gilmore  [facility] 01/08/2012, 12:17 PM

## 2012-01-07 NOTE — Care Management Note (Addendum)
    Page 1 of 2   01/08/2012     10:50:23 AM   CARE MANAGEMENT NOTE 01/08/2012  Patient:  Joyce Gilmore, Joyce Gilmore   Account Number:  [id number]  Date Initiated:  01/05/2012  Documentation initiated by:  [facility]  Subjective/Objective Assessment:   dx thrombocytopenia, mrsa prosthetic joint infection  admit- lives with daughter.  Has worked with Genevieve Norlander in the past.     Action/Plan:   pt eval- rec hhpt   Anticipated DC Date:  01/08/2012   Anticipated DC Plan:  HOME W HOME HEALTH SERVICES      DC Planning Services  CM consult      [facility] Choice  HOME HEALTH  Resumption Of Svcs/PTA Provider   Choice offered to / List presented to:  C-1 Patient        HH arranged  HH-1 RN  HH-2 PT      [facility] agency  [facility]   Status of service:  Completed, signed off Medicare Important Message given?   (If response is "NO", the following Medicare IM given date fields will be blank) Date Medicare IM given:   Date Additional Medicare IM given:    Discharge Disposition:  HOME W HOME HEALTH SERVICES  Per UR Regulation:  Reviewed for med. necessity/level of care/duration of stay  If discussed at Long Length of Stay Meetings, dates discussed:    Comments:  01/08/12 10:46 Letha Cape RN, BSN [phone] patient for dc today with HHRN and HHPT with [place]. Debbie with [place] notified.  Patient will need HHRN to draw a CBC twice a week and send to PCP and also draw a CK once a week and send the results to PCP.  Patient will be going with Cubucin IV.  01/07/12 9:54 Letha Cape RN, BSN [phone] patient is for dc tomorrow home with hhRN, hhpt , patient is active with [place] and would likie to continue with Elwin Sleight notified.  Soc will begin 24-48 hrs post discharge.  01/05/2012 1620 Pt is active with Genevieve Norlander for [facility]. Isidoro Donning RN CCM Case Mgmt phone [phone]

## 2012-01-07 NOTE — Progress Notes (Addendum)
[facility]    Date of Admission:  01/02/2012   Total days of antibiotics 10        Day 4 daptomycin        - previously on vanco/rif s/p 10/2 I X D   ID: Joyce Gilmore is a 77 y.o. 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 Vanco and rifampin both stopped.    Principal Problem:  *Thrombocytopenia Active Problems:  DIABETES MELLITUS, TYPE II  HYPERTENSION  Hyponatremia  Infection of right prosthetic hip joint  Chest pain   Subjective: No episodes of bleeding yesterday. No epistaxis. No blood in stool or bloody gums. . She has tolerated plt transfusions without difficulty again. No pain to her right hip  24hr event: first day to have increase in plt  Medications:     . bisacodyl  10 mg Oral Once  . DAPTOmycin (CUBICIN)  IV  500 mg Intravenous Q24H  . docusate sodium  100 mg Oral BID  . glipiZIDE  5 mg Oral QAC breakfast  . insulin aspart  0-15 Units Subcutaneous TID WC  . insulin aspart  0-5 Units Subcutaneous QHS  . metFORMIN  1,000 mg Oral BID WC  . predniSONE  50 mg Oral Q breakfast  . sodium chloride  3 mL Intravenous Q12H  . [DISCONTINUED] methylPREDNISolone (SOLU-MEDROL) injection  60 mg Intravenous Daily    Objective: Vital signs in last 24 hours: Temp:  [98 F (36.7 C)-99 F (37.2 C)] 98 F (36.7 C) (11/13 0609) Pulse Rate:  [72-79] 78  (11/13 0609) Resp:  [18-20] 20  (11/13 0609) BP: (131-159)/(68-81) 131/68 mmHg (11/13 0609) SpO2:  [99 %] 99 % (11/13 0609) Physical Exam  Constitutional: oriented to person, place, and time.  appears well-developed and well-nourished. No distress.  HENT:  Mouth/Throat: Oropharynx is clear and moist. No oropharyngeal exudate.    Cardiovascular: Normal rate, regular rhythm and normal heart sounds. Exam reveals no gallop and no friction rub.  No murmur heard.  Pulmonary/Chest: Effort normal and breath sounds normal. No respiratory distress. He has no wheezes.  Abdominal: Soft. Bowel sounds are normal. He exhibits no distension. There is no tenderness.  Lymphadenopathy:  no cervical adenopathy.  Skin: Skin is warm and dry. Right hip surgical incision site not necessarily erythematous but has 2 points along incision site that has serous drainage. Not indurated, nontender. No warmth.  Lab Results  Basename 01/07/12 0440 01/06/12 0500  WBC 5.0 4.9  HGB 8.0* 8.6*  HCT 24.0* 26.3*  NA 135 134*  K 4.0 3.6  CL 100 99  CO2 24 25  BUN 15 13  CREATININE 1.19* 1.14*  GLU -- --   Ck 28 on 11/11  Microbiology: 10/28 MRSA ( vanco 1, bactrim <20 S, rifampin S, tetra S) Studies/Results: No results found.   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 Vanco and rifampin both stopped. No improvement with platelet transfusions. Currently on daptomycin  1) MRSA prosthetic joint infection = continue with daptomycin 500mg  daily (  nearly 8mg /kg dosing for MRSA infection). Continue with weekly CK checks  2) thrombocytopenia = thought to be related to rifampin use. Continue with supportive care, steroids. recs per hematology. No signs of active bleeding presently.showing mild improvement today for the first time  3) anemia = hgb at 8 prior to surgery she was 10-12. Would consider iron infusion or oral iron supplementation.  spent greater than 35 min in counseling with patient and daughter regarding her care via an interpreter  Drue Second [facility] Cell: [phone] Pager: [phone]  01/07/2012, 2:40 PM

## 2012-01-07 NOTE — Progress Notes (Signed)
Inpatient Diabetes Program Recommendations  AACE/ADA: New Consensus Statement on Inpatient Glycemic Control (2013)  Target Ranges:  Prepandial:   less than 140 mg/dL      Peak postprandial:   less than 180 mg/dL (1-2 hours)      Critically ill patients:  140 - 180 mg/dL   Reason for Visit: Hyperglycemia Results for Joyce Gilmore, Joyce Gilmore (MRN [id number]) as of 01/07/2012 14:36  Ref. Range 01/07/2012 04:40  Sodium Latest Range: 135-145 mEq/L 135  Potassium Latest Range: 3.5-5.1 mEq/L 4.0  Chloride Latest Range: 96-112 mEq/L 100  CO2 Latest Range: 19-32 mEq/L 24  BUN Latest Range: 6-23 mg/dL 15  Creatinine Latest Range: 0.50-1.10 mg/dL 4.09 (H)  Calcium Latest Range: 8.4-10.5 mg/dL 8.1 (L)  GFR calc non Af Amer Latest Range: >90 mL/min 44 (L)  GFR calc Af Amer Latest Range: >90 mL/min 50 (L)  Glucose Latest Range: 70-99 mg/dL 811 (H)  Results for Joyce Gilmore, Joyce Gilmore (MRN [id number]) as of 01/07/2012 14:36  Ref. Range 01/06/2012 12:05 01/06/2012 17:03 01/06/2012 22:05 01/07/2012 07:59 01/07/2012 11:37  Glucose-Capillary Latest Range: 70-99 mg/dL 213 (H) 086 (H) 578 (H) 225 (H) 390 (H)     Inpatient Diabetes Program Recommendations Insulin - Meal Coverage: Add meal coverage insulin - Novolog 4 units tidwc Diet: Add CHO mod med to heart healthy diet.  Note: Will order Living Well With Diabetes in Spanish for pt to review.  Will continue to follow.

## 2012-01-07 NOTE — Progress Notes (Signed)
Physical Therapy Treatment Patient Details Name: Joyce Gilmore MRN: [id number] DOB: [birth year] Today's Date: 01/07/2012 Time: 6962-9528 PT Time Calculation (min): 24 min  PT Assessment / Plan / Recommendation Comments on Treatment Session  Pt s/p rt THA and subsequent I&D due to MRSA.  Pt doing well with mobility.  Daughter present and translating for pt.    Follow Up Recommendations  Home health PT     Does the patient have the potential to tolerate intense rehabilitation     Barriers to Discharge        Equipment Recommendations  None recommended by PT    Recommendations for Other Services    Frequency Min 3X/week   Plan Discharge plan remains appropriate;Frequency remains appropriate    Precautions / Restrictions Precautions Precautions: Anterior Hip Restrictions Weight Bearing Restrictions: No RLE Weight Bearing: Weight bearing as tolerated   Pertinent Vitals/Pain N/A    Mobility  Transfers Sit to Stand: 6: Modified independent (Device/Increase time);With upper extremity assist;From bed Stand to Sit: 6: Modified independent (Device/Increase time);With upper extremity assist;To bed Ambulation/Gait Ambulation/Gait Assistance: 5: Supervision Ambulation Distance (Feet): 240 Feet Assistive device: Rolling walker Ambulation/Gait Assistance Details: No loss of balance Gait Pattern: Step-to pattern    Exercises Total Joint Exercises Ankle Circles/Pumps: AAROM;Both;10 reps;Seated Long Arc Quad: AAROM;Both;10 reps;Seated Marching in Standing: AAROM;Right;10 reps;Standing   PT Diagnosis:    PT Problem List:   PT Treatment Interventions:     PT Goals Acute Rehab PT Goals PT Goal: Sit to Stand - Progress: Met PT Goal: Stand to Sit - Progress: Met PT Goal: Ambulate - Progress: Progressing toward goal  Visit Information  Last PT Received On: 01/07/12 Assistance Needed: +1    Subjective Data      Cognition  Overall Cognitive Status: Appears within  functional limits for tasks assessed/performed Difficult to assess due to: Non-English speaking Arousal/Alertness: Awake/alert Orientation Level: Appears intact for tasks assessed Behavior During Session: [facility] for tasks performed    Balance     End of Session PT - End of Session Equipment Utilized During Treatment: Gait belt Activity Tolerance: Patient tolerated treatment well Patient left: in bed;with call bell/phone within reach;with family/visitor present Nurse Communication: Mobility status   GP     Alexx Mcburney 01/07/2012, 10:40 AM  [employer] PT [phone]

## 2012-01-07 NOTE — Progress Notes (Addendum)
PATIENT DETAILS Name: Joyce Gilmore Age: 77 y.o. Sex: female Date of Birth: 1934-03-13 Admit Date: 01/02/2012 Admitting Physician Therisa Doyne, MD ZOX:[id number],VWUJ [name], PA-C  Subjective: Patient does not speak English.  Daughter at bedside translating.  Patient denies bleeding, melena, new bruising.  Eating well.   Assessment/Plan: Active Problems:  Thrombocytopenia  severe. Trending up.  Transfused 1 unit of Platelets on 11/10, a second unit on 11/11, and a third unit on 11/12.  spoke with Dr Arline Asp on 11/10-who reviewed her smear and labs, medications, history etc-no schistocytes on smear-no indication of TTP. Given the dramatic drop in over a few days-likely drug related-potential culprits are Vancomycin and Rifampin. I  spoke with Dr Daiva Eves on 11/10-he suggested switching over to Cubicin and to stop Vanco/Rifampin  Per Dr Debbra Riding is immune mediated-started IV Solumedrol on 11/10- on oral prednisone taper starting 11/14.  no signs of active bleeding  Hyponatremia  Resolved.  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  Pt's cbg rising due to steroids.  Hypertension   BP controlled without the use of any antihypertensive medications   Dyslipidemia  stop statins-resume when Thrombocytopenia resolves/better  Anemia   Likely secondary to chronic disease.  no signs of bleeding. Monitor  no evidence of schistocytes on smear  Disposition: To home with home health likely 11/14.  Will stay today to ensure platelets continue to rise without giving transfusion.  DVT  Prophylaxis: SCD's  Code Status: Full code   Procedures:  None  CONSULTS:  ID-phone consult-Dr Daiva Eves  hematology/oncology-Dr Murinson Ortho-Dr Magnus Ivan  PHYSICAL EXAM: Vital signs in last 24 hours: Filed Vitals:   01/06/12 1555 01/06/12 1655 01/06/12 2211 01/07/12 0609  BP: 159/81 153/80 158/69 131/68  Pulse: 72 79 73 78  Temp: 98.3 F (36.8 C) 99 F (37.2 C) 98.3 F (36.8 C) 98 F (36.7 C)  TempSrc: Oral Oral Oral Oral  Resp: 20 20 18 20   Height:      Weight:      SpO2:   99% 99%    Weight change:  Body mass index is 27.26 kg/(m^2).   Gen Exam: Awake and alert, NAD.  Sitting on side of the bed eating breakfast Neck: Supple, No JVD.   Chest: B/L Clear, no W/C/r CVS: RRR no M/R/G  Abdomen: soft, BS +, non tender, non distended.  Extremities: no edema, lower extremities warm to touch. Neurologic: Non Focal.   Skin: No Rash.No petechiae or purpura.  Small bruise on abdomen and left arm. Wounds: N/A.    Intake/Output from previous day:  Intake/Output Summary (Last 24 hours) at 01/07/12 1058 Last data filed at 01/06/12 1823  Gross per 24 hour  Intake    629 ml  Output      0 ml  Net    629 ml     LAB RESULTS: CBC  Lab 01/07/12 0440 01/06/12 0500 01/05/12 1505 01/05/12 0540 01/04/12 1500 01/02/12 1836  WBC 5.0 4.9 5.0 3.9* 5.3 --  HGB 8.0* 8.6* 8.5* 7.9* 8.3* --  HCT 24.0* 26.3* 25.7* 23.3* 24.6* --  PLT 27* 17* 15* 12* 17* --  MCV 80.0 80.2 80.6 80.9 81.5 --  MCH 26.7 26.2 26.6 27.4 27.5 --  MCHC 33.3 32.7 33.1 33.9 33.7 --  RDW 14.3 14.4 14.4 14.6 14.9 --  LYMPHSABS -- -- -- -- -- 1.0  MONOABS -- -- -- -- -- 0.7  EOSABS -- -- -- -- -- 0.4  BASOSABS -- -- -- -- -- 0.0  BANDABS -- -- -- -- -- --    Chemistries   Lab 01/07/12 0440 01/06/12 0500 01/05/12 0540 01/04/12 0635 01/03/12 1120 01/03/12 0532  NA 135 134* 131* 128* 125* --  K 4.0 3.6 3.7 4.2 4.4 --  CL 100 99 99 97 94* --  CO2 24 25 23 21 21  --  GLUCOSE 240* 191* 168* 198* 143* --    BUN 15 13 9 11 12  --  CREATININE 1.19* 1.14* 1.12* 1.20* 1.31* --  CALCIUM 8.1* 8.4 7.8* 7.9* 8.2* --  MG -- -- -- -- -- 1.3*    CBG:  Lab 01/07/12 0759 01/06/12 2205 01/06/12 1703 01/06/12 1205 01/06/12 0815  GLUCAP 225* 281* 277* 260* 186*    GFR Estimated Creatinine Clearance: 36.8 ml/min (by C-G formula based on Cr of 1.19).  Coagulation profile  Lab 01/04/12 0950  INR 1.24  PROTIME --    Cardiac Enzymes  Lab 01/03/12 1120 01/03/12 0532 01/03/12 0002  CKMB -- -- --  TROPONINI <0.30 <0.30 <0.30  MYOGLOBIN -- -- --    MICROBIOLOGY: Recent Results (from the past 240 hour(s))  URINE CULTURE     Status: Normal   Collection Time   01/02/12  7:46 PM      Component Value Range Status Comment   Specimen Description URINE, CLEAN CATCH   Final    Special Requests NONE   Final    Culture  Setup Time 01/02/2012 20:44   Final    Colony Count 4,000 COLONIES/ML   Final    Culture INSIGNIFICANT GROWTH   Final    Report Status 01/04/2012 FINAL   Final   CLOSTRIDIUM DIFFICILE BY PCR     Status: Normal   Collection Time   01/03/12 11:27 AM      Component Value Range Status Comment   C difficile by pcr NEGATIVE  NEGATIVE Final     RADIOLOGY STUDIES/RESULTS: Dg Chest 2 View  01/02/2012  *RADIOLOGY REPORT*  Clinical Data: Weakness, shortness of breath  CHEST - 2 VIEW  Comparison: 12/04/2011  Findings: Cardiomediastinal silhouette is stable.  No acute infiltrate or pleural effusion.  No pulmonary edema.  There is a right arm PICC line with tip in right atrium.  For distal SVC position the PICC line should be retracted about 2.5 cm. No diagnostic pneumothorax.  IMPRESSION:  No acute infiltrate or pleural effusion.  No pulmonary edema. There is a right arm PICC line with tip in right atrium.  For distal SVC position the PICC line should be retracted about 2.5 cm. No diagnostic pneumothorax.   Original Report Authenticated By: Natasha Mead, M.D.    Ct Angio Chest Pe W/cm &/or Wo  Cm  01/03/2012  *RADIOLOGY REPORT*  Clinical Data: Chest pain, shortness of breath, neck pain and headache.  CT ANGIOGRAPHY CHEST  Technique:  Multidetector CT imaging of the chest using the standard protocol during bolus administration of intravenous contrast. Multiplanar reconstructed images including MIPs were obtained and reviewed to evaluate the vascular anatomy.  Contrast: OMNIPAQUE IOHEXOL 350 MG/ML SOLN  Comparison: Chest radiograph performed 01/02/2012  Findings: There is no evidence  of pulmonary embolus.  Minimal bilateral atelectasis is noted.  There is no evidence of significant focal consolidation, pleural effusion or pneumothorax. No masses are identified; no abnormal focal contrast enhancement is seen.  The mediastinum is unremarkable in appearance.  No mediastinal lymphadenopathy is seen.  No pericardial effusion is identified. The great vessels are grossly unremarkable in appearance.  No axillary lymphadenopathy is seen.  The visualized portions of the thyroid gland are unremarkable in appearance.  The visualized portions of the liver and spleen are unremarkable.  No acute osseous abnormalities are seen.  IMPRESSION:  1.  No evidence of pulmonary embolus. 2.  Minimal bilateral atelectasis noted; lungs otherwise clear.   Original Report Authenticated By: Tonia Ghent, M.D.     MEDICATIONS: Scheduled Meds:    . bisacodyl  10 mg Oral Once  . DAPTOmycin (CUBICIN)  IV  500 mg Intravenous Q24H  . docusate sodium  100 mg Oral BID  . insulin aspart  0-15 Units Subcutaneous TID WC  . insulin aspart  0-5 Units Subcutaneous QHS  . predniSONE  50 mg Oral Q breakfast  . sodium chloride  3 mL Intravenous Q12H  . [DISCONTINUED] DAPTOmycin (CUBICIN)  IV  400 mg Intravenous Q24H  . [DISCONTINUED] methylPREDNISolone (SOLU-MEDROL) injection  60 mg Intravenous Daily   Continuous Infusions:  PRN Meds:.acetaminophen, acetaminophen, albuterol, HYDROcodone-acetaminophen, methocarbamol, morphine  injection, ondansetron (ZOFRAN) IV, ondansetron, oxyCODONE-acetaminophen, sodium chloride  Antibiotics: Anti-infectives     Start     Dose/Rate Route Frequency Ordered Stop   01/07/12 1230   DAPTOmycin (CUBICIN) 500 mg in sodium chloride 0.9 % IVPB        500 mg 220 mL/hr over 30 Minutes Intravenous Every 24 hours 01/06/12 1345     01/04/12 1015   DAPTOmycin (CUBICIN) 400 mg in sodium chloride 0.9 % IVPB  Status:  Discontinued        400 mg 216 mL/hr over 30 Minutes Intravenous Every 24 hours 01/04/12 1002 01/06/12 1345   01/03/12 0100   rifampin (RIFADIN) capsule 300 mg  Status:  Discontinued        300 mg Oral Every 12 hours 01/03/12 0001 01/04/12 0901           Shevy, Yaney [facility] Pager:[phone]  If 7PM-7AM, please contact night-coverage [email or web address] Password [email or web address] 01/07/2012, 10:58 AM   LOS: 5 days

## 2012-01-08 DIAGNOSIS — T84039A Mechanical loosening of unspecified internal prosthetic joint, initial encounter: Secondary | ICD-10-CM

## 2012-01-08 LAB — GLUCOSE, CAPILLARY
Glucose-Capillary: 231 mg/dL — ABNORMAL HIGH (ref 70–99)
Glucose-Capillary: 278 mg/dL — ABNORMAL HIGH (ref 70–99)

## 2012-01-08 LAB — CBC
MCH: 26.8 pg (ref 26.0–34.0)
MCV: 80 fL (ref 78.0–100.0)
Platelets: 41 10*3/uL — ABNORMAL LOW (ref 150–400)
RDW: 14.4 % (ref 11.5–15.5)
WBC: 6.5 10*3/uL (ref 4.0–10.5)

## 2012-01-08 MED ORDER — HEPARIN SOD (PORK) LOCK FLUSH 100 UNIT/ML IV SOLN
250.0000 [IU] | INTRAVENOUS | Status: AC | PRN
  Administered 2012-01-08: 500 [IU]

## 2012-01-08 MED ORDER — LIVING WELL WITH DIABETES BOOK - IN SPANISH
Freq: Once | Status: AC
  Administered 2012-01-08: 15:00:00
  Filled 2012-01-08: qty 1

## 2012-01-08 MED ORDER — FERROUS SULFATE 325 (65 FE) MG PO TABS: 325.0000 mg | ORAL_TABLET | Freq: Two times a day (BID) | ORAL | Status: DC

## 2012-01-08 MED ORDER — FERROUS SULFATE 325 (65 FE) MG PO TABS
325.0000 mg | ORAL_TABLET | Freq: Two times a day (BID) | ORAL | Status: DC
  Filled 2012-01-08 (×2): qty 1

## 2012-01-08 NOTE — Progress Notes (Signed)
1645 Discharge instructions reviewed with patient  And two daughters verbalized and understand . Prescriptions given to daughter. Skin WNL except right hip dressing clean and dry.

## 2012-01-08 NOTE — Discharge Summary (Signed)
Addendum  Patient seen and examined, chart and data base reviewed.  I agree with the above assessment and plan.  For full details please see Mrs. Algis Downs PA. Note.  MRSA infection of Rt prosthetic hip and thrombocytopenia, improved.   Clint Lipps, MD [facility] Pager: [phone] 01/08/2012, 4:02 PM

## 2012-01-08 NOTE — Progress Notes (Signed)
Inpatient Diabetes Program Recommendations  AACE/ADA: New Consensus Statement on Inpatient Glycemic Control (2013)  Target Ranges:  Prepandial:   less than 140 mg/dL      Peak postprandial:   less than 180 mg/dL (1-2 hours)      Critically ill patients:  140 - 180 mg/dL   Reason for Visit: Hyperglycemia  Results for Joyce Gilmore, Joyce Gilmore (MRN [id number]) as of 01/08/2012 11:06  Ref. Range 01/07/2012 07:59 01/07/2012 11:37 01/07/2012 17:29 01/07/2012 21:43 01/08/2012 07:49  Glucose-Capillary Latest Range: 70-99 mg/dL 409 (H) 811 (H) 914 (H) 118 (H) 231 (H)   Pred taper to start today.  CBGs improving.  Will be discharged this am.  Ordered Living Well With Diabetes book in Spanish for pt to have as reference at home.  Will need to f/u with PCP for management of diabetes.

## 2012-01-08 NOTE — Progress Notes (Signed)
Physical Therapy Treatment Patient Details Name: Joyce Gilmore MRN: [id number] DOB: 1934/05/01 Today's Date: 01/08/2012 Time: 9562-1308 PT Time Calculation (min): 23 min  PT Assessment / Plan / Recommendation Comments on Treatment Session  Pt. s/p right THR and subsequent ID due to MRSA.  Progressing mobility well.  Daughter and niece present and active inher care, providing translation as well.    Follow Up Recommendations  Home health PT     Does the patient have the potential to tolerate intense rehabilitation     Barriers to Discharge        Equipment Recommendations  None recommended by PT    Recommendations for Other Services    Frequency Min 3X/week   Plan Discharge plan remains appropriate;Frequency remains appropriate    Precautions / Restrictions Precautions Precautions: Anterior Hip Restrictions Weight Bearing Restrictions: Yes RLE Weight Bearing: Weight bearing as tolerated Other Position/Activity Restrictions: WBAT   Pertinent Vitals/Pain Denies pain    Mobility  Bed Mobility Bed Mobility: Supine to Sit Supine to Sit: 6: Modified independent (Device/Increase time);HOB flat Sitting - Scoot to Edge of Bed: 6: Modified independent (Device/Increase time) Sit to Supine: Not Tested (comment) Details for Bed Mobility Assistance: No cues needed.  Patient used appropriate technique Transfers Transfers: Sit to Stand;Stand to Sit Sit to Stand: 6: Modified independent (Device/Increase time);With upper extremity assist;From bed Stand to Sit: 6: Modified independent (Device/Increase time);With upper extremity assist;With armrests;To chair/3-in-1 Details for Transfer Assistance: Appropriate techcnique demonstrated Ambulation/Gait Ambulation/Gait Assistance: 5: Supervision Ambulation Distance (Feet): 240 Feet Assistive device: Rolling walker Ambulation/Gait Assistance Details: good pace and technique, no standing rest break needed Gait Pattern: Step-to  pattern Gait velocity: decreased Stairs: No    Exercises Total Joint Exercises Ankle Circles/Pumps: AAROM;Both;Seated;15 reps Long Arc Quad: AAROM;Both;10 reps;Seated   PT Diagnosis:    PT Problem List:   PT Treatment Interventions:     PT Goals Acute Rehab PT Goals PT Goal: Supine/Side to Sit - Progress: Progressing toward goal PT Goal: Sit to Stand - Progress: Progressing toward goal PT Goal: Stand to Sit - Progress: Progressing toward goal PT Goal: Ambulate - Progress: Progressing toward goal  Visit Information  Last PT Received On: 01/08/12 Assistance Needed: +1    Subjective Data  Subjective: Pt. wants to walk and conveys that she hopes to go home today   Cognition  Overall Cognitive Status: Appears within functional limits for tasks assessed/performed Difficult to assess due to: Non-English speaking Arousal/Alertness: Awake/alert Orientation Level: Appears intact for tasks assessed Behavior During Session: [facility] for tasks performed    Balance     End of Session PT - End of Session Equipment Utilized During Treatment: Gait belt Activity Tolerance: Patient tolerated treatment well Patient left: in chair;with call bell/phone within reach;with family/visitor present Nurse Communication: Mobility status   GP     Ferman Hamming 01/08/2012, 3:56 PM Weldon Picking PT Acute Rehab Services [phone] Beeper [phone]

## 2012-01-08 NOTE — Progress Notes (Signed)
Patient ID: Joyce Gilmore, female   DOB: [birth year], 77 y.o.   MRN: [id number] Incision looks better each day at right hip.  Slowly draining less.  No redness at all.

## 2012-01-08 NOTE — Progress Notes (Addendum)
[facility]    Date of Admission:  01/02/2012   Total days of antibiotics 11        Day 5 daptomycin        - previously on vanco/rif s/p 10/2 I X D   ID: Joyce Gilmore is a 77 y.o. 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 Vanco and rifampin both stopped.    Principal Problem:  *Thrombocytopenia Active Problems:  DIABETES MELLITUS, TYPE II  HYPERTENSION  Hyponatremia  Infection of right prosthetic hip joint  Chest pain   Subjective: No episodes of bleeding yesterday. No epistaxis. No blood in stool or bloody gums. . No pain to her right hip  24hr event: plt continues to increase x 2 days  Medications:     . bisacodyl  10 mg Oral Once  . DAPTOmycin (CUBICIN)  IV  500 mg Intravenous Q24H  . docusate sodium  100 mg Oral BID  . ferrous sulfate  325 mg Oral BID WC  . glipiZIDE  5 mg Oral QAC breakfast  . insulin aspart  0-15 Units Subcutaneous TID WC  . insulin aspart  0-5 Units Subcutaneous QHS  . [COMPLETED] living well with diabetes book- in spanish   Does not apply Once  . metFORMIN  1,000 mg Oral BID WC  . predniSONE  50 mg Oral Q breakfast  . sodium chloride  3 mL Intravenous Q12H    Objective: Vital signs in last 24 hours: Temp:  [98 F (36.7 C)-98.2 F (36.8 C)] 98 F (36.7 C) (11/14 0622) Pulse Rate:  [71-73] 73  (11/14 0622) Resp:  [16] 16  (11/14 0622) BP: (136-138)/(40-56) 136/40 mmHg (11/14 0622) SpO2:  [97 %-100 %] 97 % (11/14 0622) Physical Exam  Constitutional: oriented to person, place, and time.  appears well-developed and well-nourished. No distress.  HENT:  Mouth/Throat: Oropharynx is clear and moist. No oropharyngeal exudate.  Cardiovascular: Normal rate,  regular rhythm and normal heart sounds. Exam reveals no gallop and no friction rub.  No murmur heard.  Pulmonary/Chest: Effort normal and breath sounds normal. No respiratory distress. He has no wheezes.  Abdominal: Soft. Bowel sounds are normal. He exhibits no distension. There is no tenderness.  Lymphadenopathy:  no cervical adenopathy.  Skin: Skin is warm and dry. Right hip surgical incision site no erythematous but has 2 points along incision site that has serous drainage. Not indurated, nontender. No warmth.  Lab Results  Basename 01/08/12 0548 01/07/12 0440 01/06/12 0500  WBC 6.5 5.0 --  HGB 8.3* 8.0* --  HCT 24.8* 24.0* --  NA -- 135 134*  K -- 4.0 3.6  CL -- 100 99  CO2 -- 24 25  BUN -- 15 13  CREATININE -- 1.19* 1.14*  GLU -- -- --   Ck 28 on 11/11  Microbiology: 10/28 MRSA ( vanco 1, bactrim <20 S, rifampin S, tetra S) Studies/Results: No results found.   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 Vanco and rifampin both stopped. No improvement  with platelet transfusions. Currently on daptomycin  1) MRSA prosthetic joint infection = continue with daptomycin 500mg  daily (nearly 8mg /kg dosing for MRSA infection). 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 Continue with supportive care, steroids. recs per hematology. No signs of active bleeding presently.continues to show improvement. Recommend to repeat cbc by Monday at the latest 11/18  3) anemia = hgb at 8 prior to surgery she was 10-12. Would consider iron infusion or oral iron supplementation.  - will have back in ID clinic in 2-3 wk  spent greater than 35 min  in counseling with patient and daughter regarding her care via an interpreter  Drue Second [facility] Cell: [phone] Pager: [phone]  01/08/2012, 4:10 PM

## 2012-01-08 NOTE — Progress Notes (Signed)
Patient discharged with PICC line intact per order to right  Upper arm.

## 2012-01-13 ENCOUNTER — Encounter (HOSPITAL_COMMUNITY): Payer: Self-pay | Admitting: *Deleted

## 2012-01-13 ENCOUNTER — Telehealth: Payer: Self-pay | Admitting: Internal Medicine

## 2012-01-13 ENCOUNTER — Emergency Department (HOSPITAL_COMMUNITY)
Admission: EM | Admit: 2012-01-13 | Discharge: 2012-01-13 | Disposition: A | Discharge status: Home / Self Care | Payer: Medicaid Other | Attending: Emergency Medicine | Admitting: Emergency Medicine

## 2012-01-13 ENCOUNTER — Other Ambulatory Visit: Payer: Self-pay

## 2012-01-13 DIAGNOSIS — Z0189 Encounter for other specified special examinations: Secondary | ICD-10-CM

## 2012-01-13 DIAGNOSIS — E119 Type 2 diabetes mellitus without complications: Secondary | ICD-10-CM | POA: Insufficient documentation

## 2012-01-13 DIAGNOSIS — E875 Hyperkalemia: Secondary | ICD-10-CM | POA: Insufficient documentation

## 2012-01-13 DIAGNOSIS — I129 Hypertensive chronic kidney disease with stage 1 through stage 4 chronic kidney disease, or unspecified chronic kidney disease: Secondary | ICD-10-CM | POA: Insufficient documentation

## 2012-01-13 DIAGNOSIS — N189 Chronic kidney disease, unspecified: Secondary | ICD-10-CM | POA: Insufficient documentation

## 2012-01-13 DIAGNOSIS — R5381 Other malaise: Secondary | ICD-10-CM | POA: Insufficient documentation

## 2012-01-13 DIAGNOSIS — Z8659 Personal history of other mental and behavioral disorders: Secondary | ICD-10-CM | POA: Insufficient documentation

## 2012-01-13 DIAGNOSIS — Z79899 Other long term (current) drug therapy: Secondary | ICD-10-CM | POA: Insufficient documentation

## 2012-01-13 DIAGNOSIS — R630 Anorexia: Secondary | ICD-10-CM | POA: Insufficient documentation

## 2012-01-13 LAB — CBC WITH DIFFERENTIAL/PLATELET
Basophils Absolute: 0.1 10*3/uL (ref 0.0–0.1)
HCT: 29.6 % — ABNORMAL LOW (ref 36.0–46.0)
Hemoglobin: 9.8 g/dL — ABNORMAL LOW (ref 12.0–15.0)
Lymphocytes Relative: 27 % (ref 12–46)
Monocytes Absolute: 1 10*3/uL (ref 0.1–1.0)
Monocytes Relative: 9 % (ref 3–12)
Neutro Abs: 6.5 10*3/uL (ref 1.7–7.7)
Neutrophils Relative %: 60 % (ref 43–77)
RDW: 14.7 % (ref 11.5–15.5)
WBC: 10.7 10*3/uL — ABNORMAL HIGH (ref 4.0–10.5)

## 2012-01-13 LAB — COMPREHENSIVE METABOLIC PANEL
BUN: 31 mg/dL — ABNORMAL HIGH (ref 6–23)
Calcium: 8.9 mg/dL (ref 8.4–10.5)
Creatinine, Ser: 1.41 mg/dL — ABNORMAL HIGH (ref 0.50–1.10)
GFR calc Af Amer: 41 mL/min — ABNORMAL LOW (ref >90)
GFR calc non Af Amer: 35 mL/min — ABNORMAL LOW (ref >90)
Glucose, Bld: 110 mg/dL — ABNORMAL HIGH (ref 70–99)
Total Protein: 6.3 g/dL (ref 6.0–8.3)

## 2012-01-13 NOTE — ED Notes (Signed)
paient has picc line in her right upper arm.  She had blood work done yesterday.  She has hx of surgery and developed infection.  She is receiving iv antibiotics at home.  Patient with onset of feeling dizzy on Sunday.  She denies chest pain,  Admits to having some shortness of breath when walking

## 2012-01-13 NOTE — ED Provider Notes (Signed)
History     CSN: [id number]  Arrival date & time 01/13/12  1621   First MD Initiated Contact with Patient 01/13/12 1650      Chief Complaint  Patient presents with  . Abnormal Lab    (Consider location/radiation/quality/duration/timing/severity/associated sxs/prior treatment) HPI Interpreter phones. Pt's daughter provided history. Pt brought to the ED after labs done at home were abnormal today. She was recently admitted for post-operative infection. Getting home IV antibiotics. She had labs done today which showed elevated potassium. She has had some generalized weakness and decreased appetite since going home but no fever.   Past Medical History  Diagnosis Date  . Hypertension   . Depression     hx of   . Diabetes mellitus without complication   . Chronic kidney disease     hx of kidney stones,   . Arthritis     Past Surgical History  Procedure Date  . Total hip revision 12/05/2011    Procedure: TOTAL HIP REVISION;  Surgeon: Kathryne Hitch, MD;  Location: [facility] ORS;  Service: Orthopedics;  Laterality: Right;  Right Hip Revision Arthroplasty to Total Hip, Excision of Old Implant  . Incision and drainage hip 12/22/2011    Procedure: IRRIGATION AND DEBRIDEMENT HIP;  Surgeon: Kathryne Hitch, MD;  Location: [facility] OR;  Service: Orthopedics;  Laterality: Right;  Irrigation and debridement right hip  . Incision and drainage hip 12/27/2011    Procedure: IRRIGATION AND DEBRIDEMENT HIP;  Surgeon: Kathryne Hitch, MD;  Location: [facility] OR;  Service: Orthopedics;  Laterality: Right;  Repeat irrigation and debridement  . Joint replacement     bilateral knee, right hip     Family History  Problem Relation Age of Onset  . Colon cancer Neg Hx   . Hypertension Mother     History  Substance Use Topics  . Smoking status: Never Smoker   . Smokeless tobacco: Never Used  . Alcohol Use: No    OB History    Grav Para Term Preterm Abortions TAB SAB Ect Mult Living            Review of Systems All other systems reviewed and are negative except as noted in HPI.   Allergies  Rifampin and Vancomycin  Home Medications   Current Outpatient Rx  Name  Route  Sig  Dispense  Refill  . CALCIUM CARBONATE-VITAMIN D 500-200 MG-UNIT PO TABS   Oral   Take 1 tablet by mouth daily.         Marland Kitchen DAPTOMYCIN 500 MG IV SOLR   Injection   Inject 10 mLs as directed daily. 01-09-2012 thru 01-15-2012         . FERROUS SULFATE 325 (65 FE) MG PO TABS   Oral   Take 1 tablet (325 mg total) by mouth 2 (two) times daily with a meal.   60 tablet   0   . GLIPIZIDE ER 5 MG PO TB24   Oral   Take 5 mg by mouth daily.         Marland Kitchen LISINOPRIL 40 MG PO TABS   Oral   Take 40 mg by mouth every morning.          Marland Kitchen METFORMIN HCL 1000 MG PO TABS   Oral   Take 1,000 mg by mouth 2 (two) times daily with a meal.         . SITAGLIPTIN PHOSPHATE 100 MG PO TABS   Oral   Take 100 mg by mouth every  morning.            BP 116/54  Pulse 82  Temp 97.5 F (36.4 C) (Oral)  Resp 22  SpO2 98%  Physical Exam  Nursing note and vitals reviewed. Constitutional: She appears well-developed and well-nourished.  HENT:  Head: Normocephalic and atraumatic.  Eyes: EOM are normal. Pupils are equal, round, and reactive to light.  Neck: Normal range of motion. Neck supple.  Cardiovascular: Normal rate, normal heart sounds and intact distal pulses.   Pulmonary/Chest: Effort normal and breath sounds normal.  Abdominal: Bowel sounds are normal. She exhibits no distension. There is no tenderness.  Musculoskeletal: Normal range of motion. She exhibits no edema and no tenderness.       PICC line in RUE without signs of infection  Neurological: She is alert. She has normal strength. No cranial nerve deficit or sensory deficit.  Skin: Skin is warm and dry. No rash noted.  Psychiatric: She has a normal mood and affect.    ED Course  Procedures (including critical care time)  Labs  Reviewed  COMPREHENSIVE METABOLIC PANEL - Abnormal; Notable for the following:    Sodium 126 (*)     CO2 18 (*)     Glucose, Bld 110 (*)     BUN 31 (*)     Creatinine, Ser 1.41 (*)     Albumin 3.0 (*)     Total Bilirubin 0.2 (*)     GFR calc non Af Amer 35 (*)     GFR calc Af Amer 41 (*)     All other components within normal limits  CBC WITH DIFFERENTIAL - Abnormal; Notable for the following:    WBC 10.7 (*)     RBC 3.71 (*)     Hemoglobin 9.8 (*)     HCT 29.6 (*)     All other components within normal limits   No results found.   No diagnosis found.    MDM   Date: 01/13/2012  Rate: 82  Rhythm: normal sinus rhythm  QRS Axis: normal  Intervals: normal  ST/T Wave abnormalities: normal, no peaked T-waves  Conduction Disutrbances:none  Narrative Interpretation:   Old EKG Reviewed: unchanged   6:06 PM K normal here. No concern for acute hyperkalemia and no changes on EKG. Suspect external lab error. Plan discharge home with outpatient followup.       Charles B. Bernette Mayers, MD 01/13/12 1806

## 2012-01-13 NOTE — ED Notes (Signed)
Discharge instructions reviewed with daughter. Daughter verbalized understanding.

## 2012-01-13 NOTE — Telephone Encounter (Signed)
No charge. 

## 2012-01-14 ENCOUNTER — Encounter: Payer: Medicaid Other | Admitting: Internal Medicine

## 2012-01-15 NOTE — Progress Notes (Signed)
Patient ID: Joyce Gilmore, female   DOB: Oct 05, 1934, 77 y.o.   MRN: [id number] Ms. Gonzalezgaricia's right hip surgery involved irrigating skin, adipose tissue, deep tissue, muscle, bone, and the hip prosthesis.  I removed sharply (incisional debridement) necrotic soft tissue only.

## 2012-01-15 NOTE — Discharge Summary (Signed)
This patient's debridement of her infected hip was an excisional debridement of infected/necrotic soft tissue only.

## 2012-01-19 NOTE — Discharge Summary (Signed)
  See previous notes/discharge summary to address excisional debridement

## 2012-01-21 ENCOUNTER — Telehealth: Payer: Self-pay | Admitting: Infectious Disease

## 2012-01-21 ENCOUNTER — Telehealth: Payer: Self-pay | Admitting: *Deleted

## 2012-01-21 MED ORDER — SODIUM CHLORIDE 0.9 % IV SOLN: 600.0000 mg | Freq: Two times a day (BID) | INTRAVENOUS | Status: DC

## 2012-01-21 NOTE — Telephone Encounter (Signed)
CK elevated to 900. Switching her to teflaro 600 mg q12 hours

## 2012-01-21 NOTE — Telephone Encounter (Signed)
Victorino Dike from [facility] called to advise what they should do in reference to the patient CK level of 957. Advised her will have to get in touch with Dr Daiva Eves to see what he wants to do. Called Dr Daiva Eves and gave him the patient CK 957 and after review of the patient chart he advised to stop the patient Dapto and start the patient on Teflaro 600 mg, q12h. Called Victorino Dike back and gave her the information she advised she would check to make sure that Joyce Gilmore will give the first dose and give [place] a call back.

## 2012-01-21 NOTE — Telephone Encounter (Signed)
Faxed information to Short stay to set up patient first dose of antibiotic and was advised they are gone for the day and will not be back until Friday 01/23/12. Since we will not be back in the office until 01-26-12 will have to call and set up appt then.

## 2012-01-26 ENCOUNTER — Encounter (HOSPITAL_COMMUNITY): Payer: Self-pay | Admitting: Emergency Medicine

## 2012-01-26 NOTE — Telephone Encounter (Signed)
Spoke with Victorino Dike from [employer] [facility] and she advised that the patient niece who speaks english is [phone] and to call the with the 1st dose of antibiotic at short stay. Called her and gave her Wednesday 01/28/12 at 10 am at [facility] and for her to enter through Short Stay Admitting. Will call Victorino Dike back and give her the details so they can continue to dose her.   Called Clydie Braun at [place] at [phone] to advise her of the number for [facility] and to advise them of the patient 1st dose appt at short stay. She advised that they are having communicating issues with the patient and her family. Advised her to call us back if it continues. Also advised her to give Evelene Croon a call and let her know when they are going to the house so she can meet them there.

## 2012-01-27 ENCOUNTER — Other Ambulatory Visit (HOSPITAL_COMMUNITY): Payer: Self-pay | Admitting: *Deleted

## 2012-01-27 ENCOUNTER — Inpatient Hospital Stay: Payer: Self-pay | Admitting: Internal Medicine

## 2012-01-28 ENCOUNTER — Encounter (HOSPITAL_COMMUNITY)
Admission: RE | Admit: 2012-01-28 | Discharge: 2012-01-28 | Disposition: A | Discharge status: Home / Self Care | Payer: Medicaid Other | Source: Ambulatory Visit | Attending: Internal Medicine | Admitting: Internal Medicine

## 2012-01-28 ENCOUNTER — Telehealth: Payer: Self-pay | Admitting: *Deleted

## 2012-01-28 DIAGNOSIS — T8450XA Infection and inflammatory reaction due to unspecified internal joint prosthesis, initial encounter: Secondary | ICD-10-CM | POA: Insufficient documentation

## 2012-01-28 DIAGNOSIS — Z96649 Presence of unspecified artificial hip joint: Secondary | ICD-10-CM | POA: Insufficient documentation

## 2012-01-28 DIAGNOSIS — Y834 Other reconstructive surgery as the cause of abnormal reaction of the patient, or of later complication, without mention of misadventure at the time of the procedure: Secondary | ICD-10-CM | POA: Insufficient documentation

## 2012-01-28 MED ORDER — HEPARIN SOD (PORK) LOCK FLUSH 100 UNIT/ML IV SOLN
250.0000 [IU] | INTRAVENOUS | Status: AC | PRN
  Administered 2012-01-28: 250 [IU]

## 2012-01-28 MED ORDER — SODIUM CHLORIDE 0.9 % IV SOLN
600.0000 mg | Freq: Once | INTRAVENOUS | Status: AC
  Administered 2012-01-28: 600 mg via INTRAVENOUS
  Filled 2012-01-28: qty 600

## 2012-01-28 MED ORDER — HEPARIN SOD (PORK) LOCK FLUSH 100 UNIT/ML IV SOLN
INTRAVENOUS | Status: AC
  Administered 2012-01-28: 250 [IU]
  Filled 2012-01-28: qty 5

## 2012-01-28 NOTE — Telephone Encounter (Signed)
Joyce Gilmore with [facility] called to get a stop date on the patient. She advised it was origionally 02/07/12 but that with all the patients issues and not having it for a week she just wanted to make sure. Advised her the patient is to see the doctor on 02/03/12 and he will make a decision at that time. She advised fine and will sent enough antibiotic until 02/04/12 to give the doctor time to make a decision.

## 2012-02-03 ENCOUNTER — Ambulatory Visit (INDEPENDENT_AMBULATORY_CARE_PROVIDER_SITE_OTHER): Payer: Medicaid Other | Admitting: Internal Medicine

## 2012-02-03 ENCOUNTER — Encounter: Payer: Self-pay | Admitting: Internal Medicine

## 2012-02-03 VITALS — BP 147/75 | HR 82 | Temp 98.0°F | Ht 62.0 in | Wt 147.0 lb

## 2012-02-03 DIAGNOSIS — T8451XA Infection and inflammatory reaction due to internal right hip prosthesis, initial encounter: Secondary | ICD-10-CM

## 2012-02-03 DIAGNOSIS — Z96649 Presence of unspecified artificial hip joint: Secondary | ICD-10-CM

## 2012-02-03 DIAGNOSIS — T8450XA Infection and inflammatory reaction due to unspecified internal joint prosthesis, initial encounter: Secondary | ICD-10-CM

## 2012-02-03 LAB — CBC WITH DIFFERENTIAL/PLATELET
Eosinophils Absolute: 0.5 10*3/uL (ref 0.0–0.7)
Hemoglobin: 10.1 g/dL — ABNORMAL LOW (ref 12.0–15.0)
Lymphocytes Relative: 21 % (ref 12–46)
Lymphs Abs: 1.5 10*3/uL (ref 0.7–4.0)
MCH: 26.4 pg (ref 26.0–34.0)
MCV: 82.5 fL (ref 78.0–100.0)
Monocytes Relative: 9 % (ref 3–12)
Neutrophils Relative %: 62 % (ref 43–77)
Platelets: 236 10*3/uL (ref 150–400)
RBC: 3.82 MIL/uL — ABNORMAL LOW (ref 3.87–5.11)
WBC: 7.4 10*3/uL (ref 4.0–10.5)

## 2012-02-03 LAB — COMPLETE METABOLIC PANEL WITH GFR
ALT: 17 U/L (ref 0–35)
AST: 22 U/L (ref 0–37)
BUN: 19 mg/dL (ref 6–23)
Creat: 1.24 mg/dL — ABNORMAL HIGH (ref 0.50–1.10)
GFR, Est African American: 49 mL/min — ABNORMAL LOW
Total Bilirubin: 0.2 mg/dL — ABNORMAL LOW (ref 0.3–1.2)

## 2012-02-03 LAB — C-REACTIVE PROTEIN: CRP: <0.5 mg/dL (ref <0.60)

## 2012-02-03 LAB — SEDIMENTATION RATE: Sed Rate: 11 mm/hr (ref 0–22)

## 2012-02-03 NOTE — Assessment & Plan Note (Addendum)
She has done well despite the difficulty with all the different antibiotics. She is due for labs today and therefore I am going to check her labs to assure that her creatinine has remained stable, and that her platelets are stable. If all remains stable, I am going to have her stop her IV therapy and start her on Bactrim one double strength tab twice a day. I will continue this for at least 3 months. She is resistant to tetracycline and erythromycin therefore no other options available. I am unable to give her rifampin with this 2 to the allergy. I will see her in one month to assure that her creatinine is stable. Her home health nurse will be notified to call the patient if labs looked reassuring.  More than 40 minutes spent with face to face contact, coordination of care and monitoring.    Addendum.  Labs reassuring.  Pul picc today and continue with Bactrim  Creat up a bit and will be monitored.  RTC in 1 month.

## 2012-02-03 NOTE — Progress Notes (Signed)
  Subjective:    Patient ID: Joyce Gilmore, female    DOB: 1934/08/04, 77 y.o.   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 She had her hip replaced in [place] many years ago and developed a late infection. Her culture did grow out MRSA that is resistant to erythromycin, tetracycline. 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 She was then changed to daptomycin however after leaving the hospital, her total CK elevated to over 900. She was therefore changed to Teflaro. She has now completed about 6 weeks minus just a few days. She has had no pot problems and no fever or chills. She otherwise feels well.   Review of Systems  Constitutional: Negative for fever, chills, activity change and fatigue.  Gastrointestinal: Negative for nausea, vomiting, abdominal pain and diarrhea.  Skin: Negative for rash.       Objective:   Physical Exam  Constitutional: She appears well-developed and well-nourished. No distress.  Cardiovascular: Normal rate, regular rhythm and normal heart sounds.  Exam reveals no gallop and no friction rub.   No murmur heard. Skin:       The patient is clean and dry with no erythema or tenderness          Assessment & Plan:

## 2012-02-04 ENCOUNTER — Telehealth: Payer: Self-pay | Admitting: *Deleted

## 2012-02-04 NOTE — Telephone Encounter (Signed)
Victorino Dike with [facility] called to get the stop date for the patient IV therapy. There were so many changes due to patient intolerability of medication. Read note from 02/03/12 visit and asked doctor what his decision was. He advised to D/C the PICC and have homecare pull as of today 02/04/12. Victorino Dike advised will stop IV meds and let nurse know ok to pull PICC.

## 2012-02-04 NOTE — Telephone Encounter (Signed)
Called patient to advise to stop the lisinopril and see her PCP as her potassium is a little high. I also advised her we gave the order to stop her PICC today and the homecare agency will be calling her to set up a time to pull the PICC since today is her last dose. Joyce Gilmore advised they are happy and will make the appt with the PCP tomorrow.

## 2012-02-05 ENCOUNTER — Telehealth: Payer: Self-pay

## 2012-02-05 DIAGNOSIS — T8451XA Infection and inflammatory reaction due to internal right hip prosthesis, initial encounter: Secondary | ICD-10-CM

## 2012-02-05 MED ORDER — SULFAMETHOXAZOLE-TMP DS 800-160 MG PO TABS: 1.0000 | ORAL_TABLET | Freq: Two times a day (BID) | ORAL | Status: DC

## 2012-02-05 NOTE — Telephone Encounter (Signed)
Pt states her medication was not called to pharmacy. Her IV antibiotics ended today and she was to start oral ATBX.   Order mentioned in chart but not added to medication list or sent to pharmacy. I will add medication and send to pharmacy per order Bactrim DS one bid for 3 months. (02-03-12 note)  Bactrim called to [facility], [address] [place]

## 2012-02-17 ENCOUNTER — Encounter: Payer: Self-pay | Admitting: Infectious Disease

## 2012-03-02 ENCOUNTER — Telehealth: Payer: Self-pay | Admitting: Audiology

## 2012-03-09 ENCOUNTER — Encounter: Payer: Self-pay | Admitting: Internal Medicine

## 2012-03-09 ENCOUNTER — Ambulatory Visit (INDEPENDENT_AMBULATORY_CARE_PROVIDER_SITE_OTHER): Payer: Medicaid Other | Admitting: Internal Medicine

## 2012-03-09 VITALS — BP 148/64 | HR 78 | Temp 98.0°F | Ht <= 58 in | Wt 149.0 lb

## 2012-03-09 DIAGNOSIS — T8451XA Infection and inflammatory reaction due to internal right hip prosthesis, initial encounter: Secondary | ICD-10-CM

## 2012-03-09 DIAGNOSIS — Z96649 Presence of unspecified artificial hip joint: Secondary | ICD-10-CM

## 2012-03-09 DIAGNOSIS — T8450XA Infection and inflammatory reaction due to unspecified internal joint prosthesis, initial encounter: Secondary | ICD-10-CM

## 2012-03-09 DIAGNOSIS — T8459XA Infection and inflammatory reaction due to other internal joint prosthesis, initial encounter: Secondary | ICD-10-CM

## 2012-03-09 LAB — CBC WITH DIFFERENTIAL/PLATELET
Basophils Absolute: 0 10*3/uL (ref 0.0–0.1)
Eosinophils Absolute: 0.1 10*3/uL (ref 0.0–0.7)
Eosinophils Relative: 1 % (ref 0–5)
HCT: 33.9 % — ABNORMAL LOW (ref 36.0–46.0)
Lymphocytes Relative: 18 % (ref 12–46)
MCH: 26.3 pg (ref 26.0–34.0)
MCHC: 31.9 g/dL (ref 30.0–36.0)
MCV: 82.7 fL (ref 78.0–100.0)
Monocytes Absolute: 0.5 10*3/uL (ref 0.1–1.0)
Platelets: 246 10*3/uL (ref 150–400)
RDW: 16.1 % — ABNORMAL HIGH (ref 11.5–15.5)
WBC: 8.5 10*3/uL (ref 4.0–10.5)

## 2012-03-09 LAB — SEDIMENTATION RATE: Sed Rate: 6 mm/hr (ref 0–22)

## 2012-03-09 NOTE — Progress Notes (Signed)
  Subjective:    Patient ID: Joyce Gilmore, female    DOB: 01-02-1935, 78 y.o.   MRN: [id number]  HPI She comes in for followup of her prosthetic joint infection of her right hip. She did have a latent infection with MRSA and was treated with Teflaro after initially developing renal insufficiency on vancomycin and mild rhabdomyolysis with daptomycin. She completed 6 weeks of therapy and I rechecked her inflammatory markers and all have essentially resolved. I then started her on Bactrim which she has been taking twice a day. She did have mild renal insufficiency however her GFR was over 50. She denies any recent fever or chills and continues to take the medicine well.   Review of Systems  Constitutional: Negative for fever, chills, fatigue and unexpected weight change.  Gastrointestinal: Negative for nausea, abdominal pain and diarrhea.  Genitourinary: Negative for urgency, decreased urine volume and difficulty urinating.  Musculoskeletal: Negative for joint swelling.       Mild arthralgia of the right shoulder  Skin: Negative for rash.       Objective:   Physical Exam  Constitutional: She appears well-developed and well-nourished. No distress.  Cardiovascular: Normal rate, regular rhythm and normal heart sounds.  Exam reveals no gallop and no friction rub.   No murmur heard. Pulmonary/Chest: Effort normal and breath sounds normal. No respiratory distress. She has no wheezes. She has no rales.  Musculoskeletal: She exhibits no tenderness.          Assessment & Plan:

## 2012-03-09 NOTE — Patient Instructions (Signed)
siga tomando el Bactrim dos veces al dia

## 2012-03-09 NOTE — Assessment & Plan Note (Signed)
She continues to do well. I will check her creatinine today to assure it is not worsening as well as her potassium which was mildly elevated before. I will also follow her inflammatory markers to sure they remain normal. If all labs are reassuring, I will follow up with her in 2 months and consider stopping therapy at that time.

## 2012-03-10 ENCOUNTER — Telehealth: Payer: Self-pay | Admitting: *Deleted

## 2012-03-10 ENCOUNTER — Other Ambulatory Visit: Payer: Self-pay | Admitting: Internal Medicine

## 2012-03-10 LAB — COMPLETE METABOLIC PANEL WITH GFR
ALT: 23 U/L (ref 0–35)
AST: 32 U/L (ref 0–37)
Albumin: 4 g/dL (ref 3.5–5.2)
Alkaline Phosphatase: 60 U/L (ref 39–117)
BUN: 20 mg/dL (ref 6–23)
Calcium: 9.7 mg/dL (ref 8.4–10.5)
Chloride: 104 mEq/L (ref 96–112)
Potassium: 6.2 mEq/L — ABNORMAL HIGH (ref 3.5–5.3)
Sodium: 132 mEq/L — ABNORMAL LOW (ref 135–145)
Total Protein: 6.8 g/dL (ref 6.0–8.3)

## 2012-03-10 MED ORDER — SODIUM POLYSTYRENE SULFONATE PO POWD: Freq: Once | ORAL | Status: DC

## 2012-03-10 NOTE — Telephone Encounter (Signed)
Message copied by Macy Mis on Wed Mar 10, 2012 11:11 AM ------      Message from: Gardiner Barefoot      Created: Wed Mar 10, 2012 10:06 AM       Could you call her and let her know that her potassium is high and needs to be addressed.  Have her temporarily stop her lisinopril and stop the bactrim and start kayexalate (prescription sent to pharmacy) for two days, which will give her diarrhea.  Have her repeat the BMP tomorrow afternoon.  She can go to her primary instead if that is easier for some reason.  Thanks

## 2012-03-10 NOTE — Telephone Encounter (Signed)
Called and left voice mail for daughter, Darien Ramus to return my call Wendall Mola CMA

## 2012-03-11 NOTE — Telephone Encounter (Signed)
Patient's daughter notified and lab appt scheduled for tomorrow at this clinic. Wendall Mola CMA

## 2012-03-12 ENCOUNTER — Telehealth: Payer: Self-pay | Admitting: Internal Medicine

## 2012-03-12 ENCOUNTER — Ambulatory Visit: Payer: Medicaid Other | Attending: Family Medicine | Admitting: Audiology

## 2012-03-12 ENCOUNTER — Other Ambulatory Visit: Payer: Self-pay | Admitting: Internal Medicine

## 2012-03-12 ENCOUNTER — Other Ambulatory Visit (INDEPENDENT_AMBULATORY_CARE_PROVIDER_SITE_OTHER): Payer: Medicaid Other

## 2012-03-12 ENCOUNTER — Telehealth: Payer: Self-pay | Admitting: *Deleted

## 2012-03-12 DIAGNOSIS — H903 Sensorineural hearing loss, bilateral: Secondary | ICD-10-CM | POA: Insufficient documentation

## 2012-03-12 DIAGNOSIS — E875 Hyperkalemia: Secondary | ICD-10-CM

## 2012-03-12 LAB — BASIC METABOLIC PANEL
BUN: 28 mg/dL — ABNORMAL HIGH (ref 6–23)
CO2: 18 mEq/L — ABNORMAL LOW (ref 19–32)
Calcium: 9.5 mg/dL (ref 8.4–10.5)
Glucose, Bld: 94 mg/dL (ref 70–99)
Sodium: 130 mEq/L — ABNORMAL LOW (ref 135–145)

## 2012-03-12 NOTE — Telephone Encounter (Signed)
Yes, thanks, that is what  I meant to do.

## 2012-03-12 NOTE — Telephone Encounter (Signed)
I have reviewed the patients lab results.  She continues to have an elevated potassium of 6.2 despite stopping Bactrim, lisinopril and treating with kayexalate and with worsening creatinine.  I have called the daughter and asked them to take her to the ED or urgent care for treatment and evaluation.  No chest pain or new symptoms.  The daughter voiced her understanding.

## 2012-03-12 NOTE — Telephone Encounter (Signed)
Pharmacist asked if he could substitute liquid kayexalate in place of the powder kayexalate as was written.  Pharmacist will still dispense correct dosage per original rx.  RN agreed with this substitution. Andree Coss, RN

## 2012-03-13 ENCOUNTER — Encounter (HOSPITAL_COMMUNITY): Payer: Self-pay | Admitting: Emergency Medicine

## 2012-03-13 ENCOUNTER — Observation Stay (HOSPITAL_COMMUNITY)
Admission: EM | Admit: 2012-03-13 | Discharge: 2012-03-15 | Disposition: A | Discharge status: Home / Self Care | Payer: Medicaid Other | Attending: Internal Medicine | Admitting: Internal Medicine

## 2012-03-13 ENCOUNTER — Emergency Department (HOSPITAL_COMMUNITY): Payer: Medicaid Other

## 2012-03-13 DIAGNOSIS — Z23 Encounter for immunization: Secondary | ICD-10-CM | POA: Insufficient documentation

## 2012-03-13 DIAGNOSIS — Y92009 Unspecified place in unspecified non-institutional (private) residence as the place of occurrence of the external cause: Secondary | ICD-10-CM | POA: Insufficient documentation

## 2012-03-13 DIAGNOSIS — I129 Hypertensive chronic kidney disease with stage 1 through stage 4 chronic kidney disease, or unspecified chronic kidney disease: Secondary | ICD-10-CM | POA: Insufficient documentation

## 2012-03-13 DIAGNOSIS — T84038A Mechanical loosening of other internal prosthetic joint, initial encounter: Secondary | ICD-10-CM

## 2012-03-13 DIAGNOSIS — Y849 Medical procedure, unspecified as the cause of abnormal reaction of the patient, or of later complication, without mention of misadventure at the time of the procedure: Secondary | ICD-10-CM | POA: Insufficient documentation

## 2012-03-13 DIAGNOSIS — T8450XA Infection and inflammatory reaction due to unspecified internal joint prosthesis, initial encounter: Secondary | ICD-10-CM | POA: Insufficient documentation

## 2012-03-13 DIAGNOSIS — N189 Chronic kidney disease, unspecified: Secondary | ICD-10-CM

## 2012-03-13 DIAGNOSIS — A4902 Methicillin resistant Staphylococcus aureus infection, unspecified site: Secondary | ICD-10-CM | POA: Insufficient documentation

## 2012-03-13 DIAGNOSIS — D696 Thrombocytopenia, unspecified: Secondary | ICD-10-CM

## 2012-03-13 DIAGNOSIS — I10 Essential (primary) hypertension: Secondary | ICD-10-CM

## 2012-03-13 DIAGNOSIS — R42 Dizziness and giddiness: Secondary | ICD-10-CM

## 2012-03-13 DIAGNOSIS — E119 Type 2 diabetes mellitus without complications: Secondary | ICD-10-CM

## 2012-03-13 DIAGNOSIS — E875 Hyperkalemia: Principal | ICD-10-CM | POA: Diagnosis present

## 2012-03-13 DIAGNOSIS — R079 Chest pain, unspecified: Secondary | ICD-10-CM

## 2012-03-13 DIAGNOSIS — N390 Urinary tract infection, site not specified: Secondary | ICD-10-CM

## 2012-03-13 DIAGNOSIS — J189 Pneumonia, unspecified organism: Secondary | ICD-10-CM

## 2012-03-13 DIAGNOSIS — E785 Hyperlipidemia, unspecified: Secondary | ICD-10-CM

## 2012-03-13 DIAGNOSIS — J9819 Other pulmonary collapse: Secondary | ICD-10-CM | POA: Insufficient documentation

## 2012-03-13 DIAGNOSIS — Z96649 Presence of unspecified artificial hip joint: Secondary | ICD-10-CM

## 2012-03-13 DIAGNOSIS — Y838 Other surgical procedures as the cause of abnormal reaction of the patient, or of later complication, without mention of misadventure at the time of the procedure: Secondary | ICD-10-CM

## 2012-03-13 DIAGNOSIS — K5730 Diverticulosis of large intestine without perforation or abscess without bleeding: Secondary | ICD-10-CM

## 2012-03-13 DIAGNOSIS — T8451XA Infection and inflammatory reaction due to internal right hip prosthesis, initial encounter: Secondary | ICD-10-CM

## 2012-03-13 DIAGNOSIS — N183 Chronic kidney disease, stage 3 unspecified: Secondary | ICD-10-CM | POA: Insufficient documentation

## 2012-03-13 DIAGNOSIS — E871 Hypo-osmolality and hyponatremia: Secondary | ICD-10-CM

## 2012-03-13 DIAGNOSIS — Z96659 Presence of unspecified artificial knee joint: Secondary | ICD-10-CM | POA: Insufficient documentation

## 2012-03-13 LAB — BASIC METABOLIC PANEL
CO2: 16 mEq/L — ABNORMAL LOW (ref 19–32)
Calcium: 10.2 mg/dL (ref 8.4–10.5)
Calcium: 9 mg/dL (ref 8.4–10.5)
Creatinine, Ser: 1.48 mg/dL — ABNORMAL HIGH (ref 0.50–1.10)
GFR calc Af Amer: 39 mL/min — ABNORMAL LOW (ref >90)
GFR calc Af Amer: 33 mL/min — ABNORMAL LOW (ref >90)
GFR calc non Af Amer: 33 mL/min — ABNORMAL LOW (ref >90)
GFR calc non Af Amer: 29 mL/min — ABNORMAL LOW (ref >90)
Potassium: 4.9 mEq/L (ref 3.5–5.1)
Sodium: 131 mEq/L — ABNORMAL LOW (ref 135–145)
Sodium: 131 mEq/L — ABNORMAL LOW (ref 135–145)

## 2012-03-13 LAB — COMPREHENSIVE METABOLIC PANEL
AST: 30 U/L (ref 0–37)
CO2: 20 mEq/L (ref 19–32)
Calcium: 9.4 mg/dL (ref 8.4–10.5)
Creatinine, Ser: 1.57 mg/dL — ABNORMAL HIGH (ref 0.50–1.10)
GFR calc Af Amer: 36 mL/min — ABNORMAL LOW (ref >90)
GFR calc non Af Amer: 31 mL/min — ABNORMAL LOW (ref >90)
Glucose, Bld: 113 mg/dL — ABNORMAL HIGH (ref 70–99)
Sodium: 129 mEq/L — ABNORMAL LOW (ref 135–145)
Total Protein: 7 g/dL (ref 6.0–8.3)

## 2012-03-13 LAB — CBC WITH DIFFERENTIAL/PLATELET
HCT: 32 % — ABNORMAL LOW (ref 36.0–46.0)
Hemoglobin: 10.4 g/dL — ABNORMAL LOW (ref 12.0–15.0)
Lymphocytes Relative: 30 % (ref 12–46)
Lymphs Abs: 1.3 10*3/uL (ref 0.7–4.0)
Monocytes Absolute: 0.3 10*3/uL (ref 0.1–1.0)
Monocytes Relative: 8 % (ref 3–12)
Neutro Abs: 2.6 10*3/uL (ref 1.7–7.7)
Neutrophils Relative %: 59 % (ref 43–77)
RBC: 3.88 MIL/uL (ref 3.87–5.11)

## 2012-03-13 LAB — CBC
MCH: 27.2 pg (ref 26.0–34.0)
MCHC: 32.9 g/dL (ref 30.0–36.0)
MCV: 82.5 fL (ref 78.0–100.0)
Platelets: 216 10*3/uL (ref 150–400)
RBC: 3.83 MIL/uL — ABNORMAL LOW (ref 3.87–5.11)
RDW: 15.8 % — ABNORMAL HIGH (ref 11.5–15.5)

## 2012-03-13 LAB — URINALYSIS, ROUTINE W REFLEX MICROSCOPIC
Glucose, UA: 100 mg/dL — AB
Specific Gravity, Urine: 1.015 (ref 1.005–1.030)
Urobilinogen, UA: 0.2 mg/dL (ref 0.0–1.0)
pH: 5.5 (ref 5.0–8.0)

## 2012-03-13 LAB — GLUCOSE, CAPILLARY
Glucose-Capillary: 69 mg/dL — ABNORMAL LOW (ref 70–99)
Glucose-Capillary: 212 mg/dL — ABNORMAL HIGH (ref 70–99)
Glucose-Capillary: 129 mg/dL — ABNORMAL HIGH (ref 70–99)

## 2012-03-13 LAB — MRSA PCR SCREENING: MRSA by PCR: NEGATIVE

## 2012-03-13 LAB — HEMOGLOBIN A1C: Hgb A1c MFr Bld: 6.5 % — ABNORMAL HIGH (ref <5.7)

## 2012-03-13 LAB — URINE MICROSCOPIC-ADD ON

## 2012-03-13 MED ORDER — INSULIN ASPART 100 UNIT/ML ~~LOC~~ SOLN
0.0000 [IU] | Freq: Three times a day (TID) | SUBCUTANEOUS | Status: DC
  Administered 2012-03-13: 3 [IU] via SUBCUTANEOUS
  Administered 2012-03-14: 1 [IU] via SUBCUTANEOUS
  Administered 2012-03-14: 3 [IU] via SUBCUTANEOUS
  Administered 2012-03-15 (×2): 2 [IU] via SUBCUTANEOUS

## 2012-03-13 MED ORDER — INFLUENZA VIRUS VACC SPLIT PF IM SUSP
0.5000 mL | INTRAMUSCULAR | Status: AC
  Administered 2012-03-14: 0.5 mL via INTRAMUSCULAR
  Filled 2012-03-13: qty 0.5

## 2012-03-13 MED ORDER — ONDANSETRON HCL 4 MG PO TABS: 4.0000 mg | ORAL_TABLET | Freq: Four times a day (QID) | ORAL | Status: DC | PRN

## 2012-03-13 MED ORDER — SODIUM POLYSTYRENE SULFONATE 15 GM/60ML PO SUSP: 15.0000 g | Freq: Once | ORAL | Status: DC

## 2012-03-13 MED ORDER — ACETAMINOPHEN 650 MG RE SUPP: 650.0000 mg | Freq: Four times a day (QID) | RECTAL | Status: DC | PRN

## 2012-03-13 MED ORDER — SODIUM CHLORIDE 0.9 % IV SOLN
1.0000 g | Freq: Once | INTRAVENOUS | Status: AC
  Administered 2012-03-13: 1 g via INTRAVENOUS
  Filled 2012-03-13: qty 10

## 2012-03-13 MED ORDER — INSULIN ASPART 100 UNIT/ML ~~LOC~~ SOLN
10.0000 [IU] | Freq: Once | SUBCUTANEOUS | Status: AC
  Administered 2012-03-13: 10 [IU] via INTRAVENOUS
  Filled 2012-03-13: qty 1

## 2012-03-13 MED ORDER — DEXTROSE 50 % IV SOLN
1.0000 | Freq: Once | INTRAVENOUS | Status: AC
  Administered 2012-03-13: 50 mL via INTRAVENOUS
  Filled 2012-03-13: qty 50

## 2012-03-13 MED ORDER — HEPARIN SODIUM (PORCINE) 5000 UNIT/ML IJ SOLN
5000.0000 [IU] | Freq: Three times a day (TID) | INTRAMUSCULAR | Status: DC
  Administered 2012-03-13 – 2012-03-15 (×5): 5000 [IU] via SUBCUTANEOUS
  Filled 2012-03-13 (×8): qty 1

## 2012-03-13 MED ORDER — ACETAMINOPHEN 325 MG PO TABS: 650.0000 mg | ORAL_TABLET | Freq: Four times a day (QID) | ORAL | Status: DC | PRN

## 2012-03-13 MED ORDER — POLYETHYLENE GLYCOL 3350 17 G PO PACK
17.0000 g | PACK | Freq: Every day | ORAL | Status: DC | PRN
  Filled 2012-03-13: qty 1

## 2012-03-13 MED ORDER — SODIUM POLYSTYRENE SULFONATE 15 GM/60ML PO SUSP
30.0000 g | Freq: Once | ORAL | Status: AC
  Administered 2012-03-13: 30 g via ORAL
  Filled 2012-03-13: qty 120

## 2012-03-13 MED ORDER — SODIUM CHLORIDE 0.9 % IV BOLUS (SEPSIS)
500.0000 mL | Freq: Once | INTRAVENOUS | Status: AC
  Administered 2012-03-13: 500 mL via INTRAVENOUS

## 2012-03-13 MED ORDER — ONDANSETRON HCL 4 MG/2ML IJ SOLN: 4.0000 mg | Freq: Four times a day (QID) | INTRAMUSCULAR | Status: DC | PRN

## 2012-03-13 MED ORDER — LINEZOLID 600 MG PO TABS
600.0000 mg | ORAL_TABLET | ORAL | Status: DC
  Administered 2012-03-13 – 2012-03-15 (×4): 600 mg via ORAL
  Filled 2012-03-13 (×7): qty 1

## 2012-03-13 MED ORDER — CALCIUM CARBONATE-VITAMIN D 500-200 MG-UNIT PO TABS
1.0000 | ORAL_TABLET | Freq: Every day | ORAL | Status: DC
  Administered 2012-03-13 – 2012-03-15 (×3): 1 via ORAL
  Filled 2012-03-13 (×3): qty 1

## 2012-03-13 MED ORDER — SODIUM CHLORIDE 0.9 % IJ SOLN
3.0000 mL | Freq: Two times a day (BID) | INTRAMUSCULAR | Status: DC
  Administered 2012-03-13 – 2012-03-15 (×5): 3 mL via INTRAVENOUS

## 2012-03-13 MED ORDER — VITAMIN B-6 50 MG PO TABS
50.0000 mg | ORAL_TABLET | Freq: Every day | ORAL | Status: DC
  Administered 2012-03-13 – 2012-03-15 (×3): 50 mg via ORAL
  Filled 2012-03-13 (×3): qty 1

## 2012-03-13 MED ORDER — PNEUMOCOCCAL VAC POLYVALENT 25 MCG/0.5ML IJ INJ
0.5000 mL | INJECTION | INTRAMUSCULAR | Status: AC
  Administered 2012-03-14: 0.5 mL via INTRAMUSCULAR
  Filled 2012-03-13 (×2): qty 0.5

## 2012-03-13 NOTE — ED Notes (Addendum)
Sent by doctor's office for high potassium, and renal failure- per daughter. Had labs drawn yesterday at office. Potassium = 6.2 yesterday

## 2012-03-13 NOTE — H&P (Addendum)
[facility] History and Physical  Joyce Gilmore AVW:[id number] DOB: 11/22/1934 DOA: 03/13/2012  Referring physician: Dr. Patria Mane PCP: Frazier Richards, PA-C   Chief Complaint: Hyperkalemia  HPI: Joyce Gilmore is a 78 y.o. female  This is a pleasant 78 year old Spanish-speaking female, interpretation provided by the family. She comes in today with a chief complaint of high potassium levels. She has a history of right hip replacement done in October of 2013, complicated by postsurgical infection for which she required another hospitalization in November of 2013. At that time she describes as having drainage from the surgical site and that's why she came to the hospital. Cultures and sensitivities from washout showed MRSA susceptible to vancomycin and Bactrim, however resistant to clindamycin. She has an allergy to rifampin. She was started on IV vancomycin and discharged home with a PICC line. Her treatment was complicated by an allergic reaction as described by the family as nausea, vomiting and general GI upset. She denied any rashes at that time or anaphylactically reactions. PICC line was discontinued, and she was started on Bactrim on 02/03/2012 by Dr. Luciana Axe. The plan was at that time to continue with the Bactrim for at least 3 months. Her potassium level was seen to be elevated in December and January, and a few days ago on January 14 Dr. Luciana Axe called her and instructed her to stop taking the Bactrim as well as her lisinopril. Patient states, however, that the pills were in the pill box and she didn't take them all out, therefore she took her Bactrim and lisinopril for the past few days as well. Her last dose of Bactrim was yesterday.  She denies currently any chest pain, she denies muscle aches, she has no fever or chills, she has no breathing difficulties, she denies any nausea vomiting or diarrhea. She is a bit surprised that she was told to come to the ED and be  admitted and generally feels at baseline.  Review of Systems: The patient denies anorexia, fever, weight loss, vision loss, decreased hearing, hoarseness, chest pain, syncope, dyspnea on exertion, peripheral edema, balance deficits, hemoptysis, abdominal pain, melena, hematochezia, severe indigestion/heartburn, hematuria, incontinence, genital sores, muscle weakness, suspicious skin lesions, transient blindness, difficulty walking, depression, unusual weight change, abnormal bleeding, enlarged lymph nodes, angioedema, and breast masses.   Past Medical History  Diagnosis Date  . Hypertension   . Depression     hx of   . Diabetes mellitus without complication   . Chronic kidney disease     hx of kidney stones,   . Arthritis    Past Surgical History  Procedure Date  . Total hip revision 12/05/2011    Procedure: TOTAL HIP REVISION;  Surgeon: Kathryne Hitch, MD;  Location: [facility] ORS;  Service: Orthopedics;  Laterality: Right;  Right Hip Revision Arthroplasty to Total Hip, Excision of Old Implant  . Incision and drainage hip 12/22/2011    Procedure: IRRIGATION AND DEBRIDEMENT HIP;  Surgeon: Kathryne Hitch, MD;  Location: [facility] OR;  Service: Orthopedics;  Laterality: Right;  Irrigation and debridement right hip  . Incision and drainage hip 12/27/2011    Procedure: IRRIGATION AND DEBRIDEMENT HIP;  Surgeon: Kathryne Hitch, MD;  Location: [facility] OR;  Service: Orthopedics;  Laterality: Right;  Repeat irrigation and debridement  . Joint replacement     bilateral knee, right hip   . Back surgery     history restored after error with record merge       Social History:  reports  that she has never smoked. She has never used smokeless tobacco. She reports that she does not drink alcohol or use illicit drugs.   Allergies  Allergen Reactions  . Rifampin Other (See Comments)    Severe thrombocytopenia  . Vancomycin Other (See Comments)    Severe thrombocytopenia    Family History    Problem Relation Age of Onset  . Colon cancer Neg Hx   . Hypertension Mother    Prior to Admission medications   Medication Sig Start Date End Date Taking? Authorizing Provider  calcium-vitamin D (OSCAL WITH D) 500-200 MG-UNIT per tablet Take 1 tablet by mouth daily.    Historical Provider, MD  ferrous sulfate 325 (65 FE) MG tablet Take 1 tablet (325 mg total) by mouth 2 (two) times daily with a meal. 01/08/12   Tora Kindred York, PA  glipiZIDE (GLUCOTROL XL) 5 MG 24 hr tablet Take 5 mg by mouth daily.    Historical Provider, MD  lisinopril (PRINIVIL,ZESTRIL) 40 MG tablet Take 40 mg by mouth every morning.     Historical Provider, MD  metFORMIN (GLUCOPHAGE) 1000 MG tablet Take 1,000 mg by mouth 2 (two) times daily with a meal.    Historical Provider, MD  sitaGLIPtin (JANUVIA) 100 MG tablet Take 100 mg by mouth every morning.     Historical Provider, MD  sodium chloride 0.9 % SOLN 250 mL with ceftaroline 600 MG SOLR 600 mg Inject 600 mg into the vein every 12 (twelve) hours. 01/21/12   Randall Hiss, MD  sodium polystyrene (KAYEXALATE) powder Take by mouth once. Take 15g po bid for 2 days 03/10/12   Gardiner Barefoot, MD  sulfamethoxazole-trimethoprim (BACTRIM DS) 800-160 MG per tablet Take 1 tablet by mouth 2 (two) times daily. 02/05/12   Gardiner Barefoot, MD   Physical Exam: Filed Vitals:   03/13/12 0953  BP: 141/50  Pulse: 68  Temp: 98.5 F (36.9 C)  TempSrc: Oral  Resp: 16  SpO2: 99%     General:  She is in no apparent distress  Eyes: No scleral icterus  ENT: Moist oropharynx  Neck: Supple, no JVD  Cardiovascular: Regular rate and rhythm, no murmurs  Respiratory: Clear to auscultation bilaterally  Abdomen: Soft nontender to palpation  Skin: No rashes  Musculoskeletal: No peripheral edema. Incision site on the right hip is well-healed without any drainage.  Psychiatric: Normal mood and affect  Neurologic: Cranial nerves II through XII intact, muscle strength 5 over  5 in 4 extremities  Labs on Admission:  Basic Metabolic Panel:  Lab 03/13/12 1610 03/12/12 1131 03/09/12 1112  NA 129* 130* 132*  K 6.3* 6.2* 6.2*  CL 99 104 104  CO2 20 18* 19  GLUCOSE 113* 94 136*  BUN 25* 28* 20  CREATININE 1.57* 1.64* 1.38*  CALCIUM 9.4 9.5 9.7  MG -- -- --  PHOS -- -- --   Liver Function Tests:  Lab 03/13/12 1015 03/09/12 1112  AST 30 32  ALT 23 23  ALKPHOS 67 60  BILITOT 0.2* 0.2*  PROT 7.0 6.8  ALBUMIN 3.7 4.0   CBC:  Lab 03/13/12 1015 03/09/12 1112  WBC 4.4 8.5  NEUTROABS 2.6 6.4  HGB 10.4* 10.8*  HCT 32.0* 33.9*  MCV 82.5 82.7  PLT 210 246   Radiological Exams on Admission: Dg Chest 2 View  03/13/2012  *RADIOLOGY REPORT*  Clinical Data: Elevated potassium  CHEST - 2 VIEW  Comparison: 01/02/2012; 12/04/2011; chest CT - 01/03/2012  Findings: Grossly  unchanged borderline enlarged cardiac silhouette and mediastinal contours.  Unchanged left basilar heterogeneous opacities favored to represent atelectasis or scar.  There is mild eventration of the medial aspect of the right hemidiaphragm.  No pleural effusion or pneumothorax.  Unchanged bones.  IMPRESSION: 1.  No acute cardiopulmonary disease.  2. Unchanged chronic left basilar atelectasis/scar.   Original Report Authenticated By: Tacey Ruiz, MD     EKG: Independently reviewed. Normal sinus rhythm no peak T waves  Assessment/Plan Problems: Hyperkalemia Recent Hip MRSA infection Hyponatremia, chronic DM2 CKD Anemia  1. Hyperkalemia - this is likely secondary to a combination of Bactrim, ACE inhibitor on a baseline of chronic kidney disease. Bactrim is well known to cause hyperkalemia. Her potassium level seems to have increased steadily over the last few weeks, therefore this does not represent an acute change. She has no EKG changes and will admit the patient to the telemetry unit. She received Kayexalate in the emergency room, as well as insulin and D50, and calcium gluconate. We'll  continue to monitor her potassium levels every 6 hours and act accordingly. We will hold the Bactrim, will hold ACE inhibitor, will hold the metformin. 2. History of right hip MRSA infection - Infectious disease service was consulted over the phone (Dr. Algis Liming) and he suggested Zyvox at this time instead of the Bactrim. 3. Hyponatremia - seems chronic from previous labs. We'll continue to monitor. 4. Diabetes mellitus - will check hemoglobin A1c, will hold her home medications. She will be started on sliding scale insulin. 5. CKD - her creatinine was normal up until November of 2013, when probably in the setting of sepsis due to her right hip infection she had a kidney injury and found a new baseline creatinine of around 1.2-1.3 which has been stable since. 6. Normocytic anemia - likely secondary to chronic disease. The hemoglobin is 10.4, stable since last November when he was 9.8.  7. Prophylaxis - heparin subcutaneous.  Consult: ID, Dr. Algis Liming. ID to see patient tomorrow.  Code Status: Full code Family Communication: Daughter in the room  Disposition Plan: : Home once potassium level normalizes and remain stable, likely 1 or 2 days.   Time spent: 65 minutes  Pamella Pert [facility] Pager [phone]  If 7PM-7AM, please contact night-coverage [email or web address] Password [email or web address] 03/13/2012, 1:49 PM

## 2012-03-13 NOTE — Progress Notes (Signed)
03/13/2012 patient transfer from the emergency room to 6700 at 1655. She is alert, oriented and ambulatory with a walker and need assistant. Patient does not speak english and family is in room. Patient have little pink under left breast, and excoriation bilateral groin. Patient does have a brown discolored area on left hip, that she had surgery October 2013. Patient also have two scarring on the right knee from prior surgery. She was placed on telemetry. Pneumonia and flu vaccine screen done. Biochemist, clinical.

## 2012-03-13 NOTE — ED Notes (Signed)
Diet ordered for pt

## 2012-03-13 NOTE — ED Provider Notes (Signed)
History     CSN: [id number]  Arrival date & time 03/13/12  7829   First MD Initiated Contact with Patient 03/13/12 1038      Chief Complaint  Patient presents with  . hyperkalemia      k = 6.2 yesterday    (Consider location/radiation/quality/duration/timing/severity/associated sxs/prior treatment) The history is provided by the patient, a caregiver, a relative and medical records. A language interpreter was used.   Spanish translation provided by her family member. 78 year old female with a past medical history significant for chronic kidney disease, diabetes mellitus, hip revision with infection.  Patient is followed by infectious disease currently for her prosthesis infection.  Her orthopedist is Dr. Magnus Ivan.  Patient was sent by her infectious disease doctor for worsening hyperkalemia.  She has no complaints at this time except for some suboccipital neck pain which is chronic.   Past Medical History  Diagnosis Date  . Hypertension   . Depression     hx of   . Diabetes mellitus without complication   . Chronic kidney disease     hx of kidney stones,   . Arthritis     Past Surgical History  Procedure Date  . Total hip revision 12/05/2011    Procedure: TOTAL HIP REVISION;  Surgeon: Kathryne Hitch, MD;  Location: [facility];  Service: Orthopedics;  Laterality: Right;  Right Hip Revision Arthroplasty to Total Hip, Excision of Old Implant  . Incision and drainage hip 12/22/2011    Procedure: IRRIGATION AND DEBRIDEMENT HIP;  Surgeon: Kathryne Hitch, MD;  Location: [facility] OR;  Service: Orthopedics;  Laterality: Right;  Irrigation and debridement right hip  . Incision and drainage hip 12/27/2011    Procedure: IRRIGATION AND DEBRIDEMENT HIP;  Surgeon: Kathryne Hitch, MD;  Location: [facility] OR;  Service: Orthopedics;  Laterality: Right;  Repeat irrigation and debridement  . Joint replacement     bilateral knee, right hip   . Back surgery     history restored after  error with record merge        Family History  Problem Relation Age of Onset  . Colon cancer Neg Hx   . Hypertension Mother     History  Substance Use Topics  . Smoking status: Never Smoker   . Smokeless tobacco: Never Used  . Alcohol Use: No    OB History    Grav Para Term Preterm Abortions TAB SAB Ect Mult Living                  Review of Systems Ten systems reviewed and are negative for acute change, except as noted in the HPI.   Allergies  Rifampin and Vancomycin  Home Medications   Current Outpatient Rx  Name  Route  Sig  Dispense  Refill  . CALCIUM CARBONATE-VITAMIN D 500-200 MG-UNIT PO TABS   Oral   Take 1 tablet by mouth daily.         Marland Kitchen FERROUS SULFATE 325 (65 FE) MG PO TABS   Oral   Take 1 tablet (325 mg total) by mouth 2 (two) times daily with a meal.   60 tablet   0   . GLIPIZIDE ER 5 MG PO TB24   Oral   Take 5 mg by mouth daily.         Marland Kitchen LISINOPRIL 40 MG PO TABS   Oral   Take 40 mg by mouth every morning.          Marland Kitchen METFORMIN HCL  1000 MG PO TABS   Oral   Take 1,000 mg by mouth 2 (two) times daily with a meal.         . SITAGLIPTIN PHOSPHATE 100 MG PO TABS   Oral   Take 100 mg by mouth every morning.          . CEFTAROLINE 600 MG IVPB   Intravenous   Inject 600 mg into the vein every 12 (twelve) hours.   [id number] mg   2   . SODIUM POLYSTYRENE SULFONATE PO POWD   Oral   Take by mouth once. Take 15g po bid for 2 days   454 g   0   . SULFAMETHOXAZOLE-TMP DS 800-160 MG PO TABS   Oral   Take 1 tablet by mouth 2 (two) times daily.   60 tablet   2     BP 141/50  Pulse 68  Temp 98.5 F (36.9 C) (Oral)  Resp 16  SpO2 99%  Physical Exam Physical Exam  Nursing note and vitals reviewed. Constitutional: She is oriented to person, place, and time. She appears well-developed and well-nourished. No distress.  HENT:  Head: Normocephalic and atraumatic.  Eyes: Conjunctivae normal and EOM are normal. Pupils are equal,  round, and reactive to light. No scleral icterus.  Neck: Normal range of motion.  Cardiovascular: Normal rate, regular rhythm and normal heart sounds.  Exam reveals no gallop and no friction rub.   No murmur heard. Pulmonary/Chest: Effort normal and breath sounds normal. No respiratory distress.  Abdominal: Soft. Bowel sounds are normal. She exhibits no distension and no mass. There is no tenderness. There is no guarding.  Neurological: She is alert and oriented to person, place, and time.  Skin: Skin is warm and dry. She is not diaphoretic.    ED Course  Procedures (including critical care time)  Labs Reviewed  COMPREHENSIVE METABOLIC PANEL - Abnormal; Notable for the following:    Sodium 129 (*)     Potassium 6.3 (*)     Glucose, Bld 113 (*)     BUN 25 (*)     Creatinine, Ser 1.57 (*)     Total Bilirubin 0.2 (*)     GFR calc non Af Amer 31 (*)     GFR calc Af Amer 36 (*)     All other components within normal limits  CBC WITH DIFFERENTIAL - Abnormal; Notable for the following:    Hemoglobin 10.4 (*)     HCT 32.0 (*)     RDW 15.8 (*)     All other components within normal limits  URINALYSIS, ROUTINE W REFLEX MICROSCOPIC   Dg Chest 2 View  03/13/2012  *RADIOLOGY REPORT*  Clinical Data: Elevated potassium  CHEST - 2 VIEW  Comparison: 01/02/2012; 12/04/2011; chest CT - 01/03/2012  Findings: Grossly unchanged borderline enlarged cardiac silhouette and mediastinal contours.  Unchanged left basilar heterogeneous opacities favored to represent atelectasis or scar.  There is mild eventration of the medial aspect of the right hemidiaphragm.  No pleural effusion or pneumothorax.  Unchanged bones.  IMPRESSION: 1.  No acute cardiopulmonary disease.  2. Unchanged chronic left basilar atelectasis/scar.   Original Report Authenticated By: Tacey Ruiz, MD     Date: 03/13/2012  Rate: 139  Rhythm: sinus tachycardia  QRS Axis: normal  Intervals: normal  ST/T Wave abnormalities: nonspecific T  wave changes  Conduction Disutrbances:none  Narrative Interpretation:   Old EKG Reviewed: unchanged    1. Hyperkalemia   2. Hyponatremia  3. Chronic kidney disease       MDM  11:53 AM Patient with hyperkalemia, worsening in elevation over past few days. Sent by ID.  Denies exogenous use.  Patient is asxs.  Treating for Hyperkalemia and will call for admission . PCP Dr. Durwin Nora at [facility].   Patient will be admitted by triad hospitalist       Arthor Captain, PA-C 03/13/12 2048

## 2012-03-13 NOTE — Progress Notes (Signed)
[facility]    Date of Admission:  03/13/2012  Date of Consult:  03/13/2012  Reason for Consult: HYPERKalemia on bactrim for pt with prosthetic hip infecition with MRSA Referring Physician: Dr. Elvera Lennox   HPI: Joyce Gilmore is an 78 y.o. 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 She had developed significant TTPEnia on vancomycin and rifampin, was changed to IV daptomycin but then had super high CPKs concerning for possible myositis and was changed to IV teflaro course which she completed before being changed to oral bactrim 1 tab bid. She has developed worsening apparent hyperkalemia and renal insufficiency and was therefore brought to ED for eval and workup. We were consulted re further abx management in this pt   Past Medical History  Diagnosis Date  . Hypertension   . Depression     hx of   . Diabetes mellitus without complication   . Chronic kidney disease     hx of kidney stones,   . Arthritis     Past Surgical History  Procedure Date  . Total hip revision 12/05/2011    Procedure: TOTAL HIP REVISION;  Surgeon: Kathryne Hitch, MD;  Location: [facility];  Service: Orthopedics;  Laterality: Right;  Right Hip Revision Arthroplasty to Total Hip, Excision of Old Implant  . Incision and drainage hip 12/22/2011    Procedure: IRRIGATION AND DEBRIDEMENT HIP;  Surgeon: Kathryne Hitch, MD;  Location[facility];  Service: Orthopedics;  Laterality: Right;  Irrigation and debridement right hip  . Incision and drainage hip 12/27/2011    Procedure: IRRIGATION AND DEBRIDEMENT HIP;  Surgeon: Kathryne Hitch, MD;  Location[facility];  Service: Orthopedics;  Laterality: Right;  Repeat irrigation and debridement  . Joint replacement     bilateral knee, right hip   . Back surgery     history restored after error with record merge      ergies:   Allergies  Allergen Reactions  . Rifampin Other (See Comments)    Severe  thrombocytopenia  . Vancomycin Other (See Comments)    Severe thrombocytopenia     Medications: I have reviewed patients current medications as documented in Epic Anti-infectives     Start     Dose/Rate Route Frequency Ordered Stop   03/13/12 2000   linezolid (ZYVOX) tablet 600 mg        600 mg Oral 2 times per day 03/13/12 1420            Social History:  reports that she has never smoked. She has never used smokeless tobacco. She reports that she does not drink alcohol or use illicit drugs.  Family History  Problem Relation Age of Onset  . Colon cancer Neg Hx   . Hypertension Mother     As in HPI and primary teams notes otherwise 12 point review of systems is negative  Blood pressure 123/45, pulse 84, temperature 97.7 F (36.5 C), temperature source Oral, resp. rate 17, height 4\' 10"  (1.473 m), weight 146 lb 9.7 oz (66.5 kg), SpO2 97.00%. General: Alert and awake, oriented x3, not in any acute distress. HEENT: anicteric sclera, pupils reactive to light and accommodation, EOMI, oropharynx clear and without exudate CVS regular rate, normal r,  no murmur rubs or gallops Chest: clear to auscultation bilaterally, no wheezing, rales or rhonchi Abdomen: soft nontender, nondistended, normal bowel sounds, Extremities: hip incision is clean and not fluctuant and healing well  Neuro: nonfocal, strength and sensation  intact   Results for orders placed during the hospital encounter of 03/13/12 (from the past 48 hour(s))  COMPREHENSIVE METABOLIC PANEL     Status: Abnormal   Collection Time   03/13/12 10:15 AM      Component Value Range Comment   Sodium 129 (*) 135 - 145 mEq/L    Potassium 6.3 (*) 3.5 - 5.1 mEq/L    Chloride 99  96 - 112 mEq/L    CO2 20  19 - 32 mEq/L    Glucose, Bld 113 (*) 70 - 99 mg/dL    BUN 25 (*) 6 - 23 mg/dL    Creatinine, Ser 4.54 (*) 0.50 - 1.10 mg/dL    Calcium 9.4  8.4 - 09.8 mg/dL    Total Protein 7.0  6.0 - 8.3 g/dL    Albumin 3.7  3.5 - 5.2 g/dL      AST 30  0 - 37 U/L    ALT 23  0 - 35 U/L    Alkaline Phosphatase 67  39 - 117 U/L    Total Bilirubin 0.2 (*) 0.3 - 1.2 mg/dL    GFR calc non Af Amer 31 (*) >90 mL/min    GFR calc Af Amer 36 (*) >90 mL/min   CBC WITH DIFFERENTIAL     Status: Abnormal   Collection Time   03/13/12 10:15 AM      Component Value Range Comment   WBC 4.4  4.0 - 10.5 K/uL    RBC 3.88  3.87 - 5.11 MIL/uL    Hemoglobin 10.4 (*) 12.0 - 15.0 g/dL    HCT 11.9 (*) 14.7 - 46.0 %    MCV 82.5  78.0 - 100.0 fL    MCH 26.8  26.0 - 34.0 pg    MCHC 32.5  30.0 - 36.0 g/dL    RDW 82.9 (*) 56.2 - 15.5 %    Platelets 210  150 - 400 K/uL    Neutrophils Relative 59  43 - 77 %    Neutro Abs 2.6  1.7 - 7.7 K/uL    Lymphocytes Relative 30  12 - 46 %    Lymphs Abs 1.3  0.7 - 4.0 K/uL    Monocytes Relative 8  3 - 12 %    Monocytes Absolute 0.3  0.1 - 1.0 K/uL    Eosinophils Relative 3  0 - 5 %    Eosinophils Absolute 0.1  0.0 - 0.7 K/uL    Basophils Relative 1  0 - 1 %    Basophils Absolute 0.0  0.0 - 0.1 K/uL   CBC     Status: Abnormal   Collection Time   03/13/12  2:12 PM      Component Value Range Comment   WBC 6.9  4.0 - 10.5 K/uL    RBC 3.83 (*) 3.87 - 5.11 MIL/uL    Hemoglobin 10.4 (*) 12.0 - 15.0 g/dL    HCT 13.0 (*) 86.5 - 46.0 %    MCV 82.5  78.0 - 100.0 fL    MCH 27.2  26.0 - 34.0 pg    MCHC 32.9  30.0 - 36.0 g/dL    RDW 78.4 (*) 69.6 - 15.5 %    Platelets 216  150 - 400 K/uL   BASIC METABOLIC PANEL     Status: Abnormal   Collection Time   03/13/12  2:12 PM      Component Value Range Comment   Sodium 131 (*) 135 - 145 mEq/L  Potassium 4.8  3.5 - 5.1 mEq/L    Chloride 101  96 - 112 mEq/L    CO2 16 (*) 19 - 32 mEq/L    Glucose, Bld 72  70 - 99 mg/dL    BUN 24 (*) 6 - 23 mg/dL    Creatinine, Ser 7.84 (*) 0.50 - 1.10 mg/dL    Calcium 69.6  8.4 - 10.5 mg/dL    GFR calc non Af Amer 33 (*) >90 mL/min    GFR calc Af Amer 39 (*) >90 mL/min   GLUCOSE, CAPILLARY     Status: Abnormal   Collection Time    03/13/12  2:25 PM      Component Value Range Comment   Glucose-Capillary 69 (*) 70 - 99 mg/dL   URINALYSIS, ROUTINE W REFLEX MICROSCOPIC     Status: Abnormal   Collection Time   03/13/12  2:31 PM      Component Value Range Comment   Color, Urine YELLOW  YELLOW    APPearance CLOUDY (*) CLEAR    Specific Gravity, Urine 1.015  1.005 - 1.030    pH 5.5  5.0 - 8.0    Glucose, UA 100 (*) NEGATIVE mg/dL    Hgb urine dipstick TRACE (*) NEGATIVE    Bilirubin Urine NEGATIVE  NEGATIVE    Ketones, ur NEGATIVE  NEGATIVE mg/dL    Protein, ur NEGATIVE  NEGATIVE mg/dL    Urobilinogen, UA 0.2  0.0 - 1.0 mg/dL    Nitrite POSITIVE (*) NEGATIVE    Leukocytes, UA LARGE (*) NEGATIVE   URINE MICROSCOPIC-ADD ON     Status: Abnormal   Collection Time   03/13/12  2:31 PM      Component Value Range Comment   Squamous Epithelial / LPF RARE  RARE    WBC, UA 21-50  <3 WBC/hpf    RBC / HPF 3-6  <3 RBC/hpf    Bacteria, UA MANY (*) RARE   GLUCOSE, CAPILLARY     Status: Abnormal   Collection Time   03/13/12  4:29 PM      Component Value Range Comment   Glucose-Capillary 212 (*) 70 - 99 mg/dL   MRSA PCR SCREENING     Status: Normal   Collection Time   03/13/12  5:21 PM      Component Value Range Comment   MRSA by PCR NEGATIVE  NEGATIVE       Component Value Date/Time   SDES URINE, CLEAN CATCH 01/02/2012 1946   SPECREQUEST NONE 01/02/2012 1946   CULT INSIGNIFICANT GROWTH 01/02/2012 1946   REPTSTATUS 01/04/2012 FINAL 01/02/2012 1946   Dg Chest 2 View  03/13/2012  *RADIOLOGY REPORT*  Clinical Data: Elevated potassium  CHEST - 2 VIEW  Comparison: 01/02/2012; 12/04/2011; chest CT - 01/03/2012  Findings: Grossly unchanged borderline enlarged cardiac silhouette and mediastinal contours.  Unchanged left basilar heterogeneous opacities favored to represent atelectasis or scar.  There is mild eventration of the medial aspect of the right hemidiaphragm.  No pleural effusion or pneumothorax.  Unchanged bones.  IMPRESSION: 1.   No acute cardiopulmonary disease.  2. Unchanged chronic left basilar atelectasis/scar.   Original Report Authenticated By: Tacey Ruiz, MD      Recent Results (from the past 720 hour(s))  MRSA PCR SCREENING     Status: Normal   Collection Time   03/13/12  5:21 PM      Component Value Range Status Comment   MRSA by PCR NEGATIVE  NEGATIVE Final  Impression/Recommendation  #1 MRSA prostheic hip infection: --given problems with bactrim toxicity (hyper K esp)  Would change to oral zyvox 600mg  twice daily Will try to get several weeks of zyvox therapy in and then consider change to lower dose bactrim to complete approximately 2 more months of therapy  PLEASE CHANGE TO ORAL ZYVOX AS YOU HAVE DONE  PLEASE CONSULT CASE MANAGEMENT TO ENSURE THAT PT WILL HAVE THIS $$$ MEDICINE COVERED BY MEDICAID.  THE PATIENTS MRSA WAS SENSITIVE TO TMP/SMX ONLY BUT SHE IS BECOMING HYPERKALEMIC ON IT  ONLY VIABLE OPTION FOR ORAL THERAPY AT THIS POINT IN TIME IS ORAL ZYVOX  PLEASE LET ME KNOW IF THERE ARE PROBLEMS WITH MEDICAID COVERING THE ZYVOX AS AN OUTPT  OTHERWISE I WILL ARRANGE FOR FU IN [facility]  PLEASE CALL WITH FURTHER QUESTIONS.     Thank you so much for this interesting consult  [facility] [phone] (pager) [phone] (office) 03/13/2012, 8:44 PM  Paulette Blanch Dam 03/13/2012, 8:44 PM

## 2012-03-14 ENCOUNTER — Encounter (HOSPITAL_COMMUNITY): Payer: Self-pay | Admitting: Emergency Medicine

## 2012-03-14 DIAGNOSIS — E875 Hyperkalemia: Principal | ICD-10-CM

## 2012-03-14 DIAGNOSIS — N189 Chronic kidney disease, unspecified: Secondary | ICD-10-CM

## 2012-03-14 DIAGNOSIS — J189 Pneumonia, unspecified organism: Secondary | ICD-10-CM

## 2012-03-14 DIAGNOSIS — R079 Chest pain, unspecified: Secondary | ICD-10-CM

## 2012-03-14 LAB — BASIC METABOLIC PANEL
CO2: 18 mEq/L — ABNORMAL LOW (ref 19–32)
Chloride: 102 mEq/L (ref 96–112)
Chloride: 102 mEq/L (ref 96–112)
GFR calc Af Amer: 37 mL/min — ABNORMAL LOW (ref >90)
GFR calc Af Amer: 43 mL/min — ABNORMAL LOW (ref >90)
GFR calc non Af Amer: 32 mL/min — ABNORMAL LOW (ref >90)
Potassium: 5.2 mEq/L — ABNORMAL HIGH (ref 3.5–5.1)
Potassium: 4.8 mEq/L (ref 3.5–5.1)
Sodium: 131 mEq/L — ABNORMAL LOW (ref 135–145)

## 2012-03-14 LAB — GLUCOSE, CAPILLARY
Glucose-Capillary: 150 mg/dL — ABNORMAL HIGH (ref 70–99)
Glucose-Capillary: 255 mg/dL — ABNORMAL HIGH (ref 70–99)
Glucose-Capillary: 114 mg/dL — ABNORMAL HIGH (ref 70–99)
Glucose-Capillary: 164 mg/dL — ABNORMAL HIGH (ref 70–99)

## 2012-03-14 MED ORDER — SODIUM CHLORIDE 0.9 % IV SOLN
INTRAVENOUS | Status: DC
  Administered 2012-03-14: 14:00:00 via INTRAVENOUS

## 2012-03-14 MED ORDER — SODIUM CHLORIDE 0.9 % IV SOLN: INTRAVENOUS | Status: DC

## 2012-03-14 MED ORDER — FUROSEMIDE 20 MG PO TABS
20.0000 mg | ORAL_TABLET | Freq: Once | ORAL | Status: AC
  Administered 2012-03-14: 20 mg via ORAL
  Filled 2012-03-14 (×2): qty 1

## 2012-03-14 MED ORDER — LINEZOLID 600 MG PO TABS: 600.0000 mg | ORAL_TABLET | Freq: Two times a day (BID) | ORAL | Status: DC

## 2012-03-14 NOTE — Progress Notes (Signed)
NCM spoke to MD regarding Zyvox. Pt has Medicaid and will need to have her pharmacy check benefits. Faxed Medicaid card to [employer] on [address]. Received call back from pharmacist and her medicaid number is not valid. Will leave message for weekday NCM to follow up on 1/20 or 1/21 in regards to coverage. Will call Zyvox PAP to see if pt qualifies for assistance with their program. Government offices are closed on 1/20 due to [facility]. Isidoro Donning RN CCM Case Mgmt phone [phone]

## 2012-03-14 NOTE — ED Provider Notes (Signed)
Medical screening examination/treatment/procedure(s) were conducted as a shared visit with non-physician practitioner(s) and myself.  I personally evaluated the patient during the encounter  Patient treated for her hyperkalemia with calcium, Kayexalate, insulin and D50.  Critical high potassium.  The patient be admitted to hospitalist service.  She will need to be monitored from a telemetry standpoint.  Her EKG is without QRS widening her peak T waves.  Unclear etiology of the cause of her hyperkalemia except as may represent a secondary complication of her use of Bactrim.  She does not appear to be on exogenous potassium supplementation.  Her kidney function is consistent with her baseline renal insufficiency for her.  Lyanne Co, MD 03/14/12 [phone]

## 2012-03-14 NOTE — Discharge Summary (Signed)
[facility]                                                                                   Joyce Gilmore, is a 78 y.o. female  DOB 09-05-1934  MRN [id number].  Admission date:  03/13/2012  Discharge Date:  03/14/2012  Primary MD  Frazier Richards, PA-C  Admitting Physician  Pamella Pert, MD  Admission Diagnosis  Hyperkalemia [276.7] Hyponatremia [276.1] Chronic kidney disease [585.9] referred by doctor  Discharge Diagnosis     Principal Problem:  *Hyperkalemia Active Problems:  DIABETES MELLITUS, TYPE II  Mechanical loosening of prosthetic hip  Infection of right prosthetic hip joint    Past Medical History  Diagnosis Date  . Hypertension   . Depression     hx of   . Diabetes mellitus without complication   . Chronic kidney disease     hx of kidney stones,   . Arthritis     Past Surgical History  Procedure Date  . Total hip revision 12/05/2011    Procedure: TOTAL HIP REVISION;  Surgeon: Kathryne Hitch, MD;  Location: [facility] OR[facility];  Service: Orthopedics;  Laterality: Right;  Right Hip Revision Arthroplasty to Total Hip, Excision of Old Implant  . Incision and drainage hip 12/22/2011    Procedure: IRRIGATION AND DEBRIDEMENT HIP;  Surgeon: Kathryne Hitch, MD;  Location: [facility] OR;  Service: Orthopedics;  Laterality: Right;  Irrigation and debridement right hip  . Incision and drainage hip 12/27/2011    Procedure: IRRIGATION AND DEBRIDEMENT HIP;  Surgeon: Kathryne Hitch, MD;  Location: [facility] OR;  Service: Orthopedics;  Laterality: Right;  Repeat irrigation and debridement  . Joint replacement     bilateral knee, right hip   . Back surgery     history restored after error with record merge         Recommendations for primary care physician for things to follow:   Monitor BMP closely   Discharge Diagnoses:   Principal Problem:  *Hyperkalemia Active Problems:  DIABETES MELLITUS, TYPE II  Mechanical loosening of  prosthetic hip  Infection of right prosthetic hip joint    Discharge Condition: Stable   Diet recommendation: See Discharge Instructions below   Consults ID   History of present illness and  Hospital Course:  See H&P, Labs, Consult and Test reports for all details in brief, patient was admitted for hyperkalemia found in outpatient setting due to patient being on Bactrim, has underlying chronic kidney disease stage III, she was seen here by infectious disease physician Dr. 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 Staci Righter.  Patient was here treated with Kayexalate with good results repeat potassium levels are stable, case manager to see the patient and arrange for outpatient Zyvox, will provide a one-month supply thereafter per ID.  She will follow with her primary care physician ID, orthopedic surgeon Dr. Magnus Ivan closely please monitor her BMP once a week for the next 2 weeks. First being on coming Wednesday.      Today   Subjective:   Joyce Gilmore  today has no headache,no chest abdominal pain,no new weakness tingling or numbness, feels much better wants to go home today.    Objective:   Blood pressure 120/47, pulse 74, temperature 98.7 F (37.1 C), temperature source Oral, resp. rate 16, height 4\' 10"  (1.473 m), weight 67.4 kg (148 lb 9.4 oz), SpO2 98.00%.   Intake/Output Summary (Last 24 hours) at 03/14/12 0951 Last data filed at 03/14/12 0554  Gross per 24 hour  Intake    243 ml  Output    900 ml  Net   -657 ml    Exam Awake Alert, Oriented *3, No new F.N deficits, Normal affect [place].AT,PERRAL Supple Neck,No JVD, No cervical lymphadenopathy appriciated.  Symmetrical Chest wall movement, Good air movement bilaterally, CTAB RRR,No Gallops,Rubs or new Murmurs, No Parasternal Heave +ve B.Sounds, Abd Soft, Non tender, No organomegaly appriciated,  No rebound -guarding or rigidity. No Cyanosis, Clubbing or edema, No new Rash or bruise  Data Review   Major procedures and Radiology Reports - PLEASE review detailed and final reports for all details in brief -       Dg Chest 2 View  03/13/2012  *RADIOLOGY REPORT*  Clinical Data: Elevated potassium  CHEST - 2 VIEW  Comparison: 01/02/2012; 12/04/2011; chest CT - 01/03/2012  Findings: Grossly unchanged borderline enlarged cardiac silhouette and mediastinal contours.  Unchanged left basilar heterogeneous opacities favored to represent atelectasis or scar.  There is mild eventration of the medial aspect of the right hemidiaphragm.  No pleural effusion or pneumothorax.  Unchanged bones.  IMPRESSION: 1.  No acute cardiopulmonary disease.  2. Unchanged chronic left basilar atelectasis/scar.   Original Report Authenticated By: Tacey Ruiz, MD     Micro Results      Recent Results (from the past 240 hour(s))  MRSA PCR SCREENING     Status: Normal   Collection Time   03/13/12  5:21 PM      Component Value Range Status Comment   MRSA by PCR NEGATIVE  NEGATIVE Final      CBC w Diff: Lab Results  Component Value Date   WBC 6.9 03/13/2012   HGB 10.4* 03/13/2012   HCT 31.6* 03/13/2012   PLT 216 03/13/2012   LYMPHOPCT 30 03/13/2012   MONOPCT 8 03/13/2012   EOSPCT 3 03/13/2012   BASOPCT 1 03/13/2012    CMP: Lab Results  Component Value Date   NA 131* 03/14/2012   K 4.8 03/14/2012   CL 102 03/14/2012   CO2 18* 03/14/2012   BUN 24* 03/14/2012   CREATININE 1.37* 03/14/2012   CREATININE 1.64* 03/12/2012   PROT 7.0 03/13/2012   ALBUMIN 3.7 03/13/2012   BILITOT 0.2* 03/13/2012   ALKPHOS 67 03/13/2012   AST 30 03/13/2012   ALT 23 03/13/2012  .   Discharge Instructions     Follow with Primary MD DIXON,MARY [name], PA-C in 3 days , follow with Dr. Rosetta Posner and Dr. Magnus Ivan in 1 week.  Get CBC, CMP, checked 3 days by Primary MD and again as instructed by your Primary MD.    Get Medicines reviewed and  adjusted.  Please request your Prim.MD to go over all Hospital Tests and Procedure/Radiological results at the follow up, please get all Hospital records sent to your Prim MD by signing hospital release before you go home.  Activity: As tolerated with Full fall precautions use walker/cane & assistance as needed   Diet:  Heart healthy - low carbohydrate  For Heart failure patients - Check your Weight  same time everyday, if you gain over 2 pounds, or you develop in leg swelling, experience more shortness of breath or chest pain, call your Primary MD immediately. Follow Cardiac Low Salt Diet and 1.8 lit/day fluid restriction.  Disposition Home   If you experience worsening of your admission symptoms, develop shortness of breath, life threatening emergency, suicidal or homicidal thoughts you must seek medical attention immediately by calling 911 or calling your MD immediately  if symptoms less severe.  You Must read complete instructions/literature along with all the possible adverse reactions/side effects for all the Medicines you take and that have been prescribed to you. Take any new Medicines after you have completely understood and accpet all the possible adverse reactions/side effects.   Do not drive and provide baby sitting services if your were admitted for syncope or siezures until you have seen by Primary MD or a Neurologist and advised to do so again.  Do not drive when taking Pain medications.    Do not take more than prescribed Pain, Sleep and Anxiety Medications  Special Instructions: If you have smoked or chewed Tobacco  in the last 2 yrs please stop smoking, stop any regular Alcohol  and or any Recreational drug use.  Wear Seat belts while driving.  Follow-up Information    Follow up with [facility] [name], PA-C. Schedule an appointment as soon as possible for a visit in 3 days.   Contact information:   [address] [place] [address] [phone]       Follow  up with Kathryne Hitch, MD. Schedule an appointment as soon as possible for a visit in 1 week.   Contact information:   [address] [place] [address] [phone]       Follow up with Staci Righter, MD. Schedule an appointment as soon as possible for a visit in 1 week.   Contact information:   [address] [place] [address] [phone]            Discharge Medications     Medication List     As of 03/14/2012  9:51 AM    START taking these medications         linezolid 600 MG tablet   Commonly known as: ZYVOX   Take 1 tablet (600 mg total) by mouth every 12 (twelve) hours.      CONTINUE taking these medications         calcium-vitamin D 500-200 MG-UNIT per tablet   Commonly known as: OSCAL WITH D      glipiZIDE 5 MG 24 hr tablet   Commonly known as: GLUCOTROL XL      metFORMIN 1000 MG tablet   Commonly known as: GLUCOPHAGE      STOP taking these medications         sulfamethoxazole-trimethoprim 800-160 MG per tablet   Commonly known as: BACTRIM DS          Where to get your medications    These are the prescriptions that you need to pick up. We sent them to a specific pharmacy, so you will need to go there to get them.   [facility] [id number], [place] - [address] AT [facility] OF [address]    [address] [place] [address]    Phone: [phone]        linezolid 600 MG tablet  Total Time in preparing paper work, data evaluation and todays exam - 35 minutes  Leroy Sea M.D on 03/14/2012 at 9:51 AM  [facility] Office  [phone]

## 2012-03-15 LAB — BASIC METABOLIC PANEL
Calcium: 9.1 mg/dL (ref 8.4–10.5)
GFR calc Af Amer: 43 mL/min — ABNORMAL LOW (ref >90)
GFR calc non Af Amer: 37 mL/min — ABNORMAL LOW (ref >90)
Potassium: 4.4 mEq/L (ref 3.5–5.1)
Sodium: 132 mEq/L — ABNORMAL LOW (ref 135–145)

## 2012-03-15 NOTE — Progress Notes (Addendum)
Pt discharged to home after visit summary reviewed with her daughter and her daughter was capable of re verbalizing medications and follow up appointments. Pt remains stable. No signs and symptoms of distress. Educated to return to ER in the event of SOB, dizziness, chest pain, or fainting. Laverda Sorenson, RN

## 2012-03-15 NOTE — Progress Notes (Addendum)
[facility]                                                                                Patient Demographics  Joyce Gilmore, is a 78 y.o. female  EAV:[id number]  NWG:[id number]  DOB - [birth year]  Admit date - 03/13/2012  Admitting Physician Joyce Pert, MD  Outpatient Primary MD for the patient is Joyce Gilmore  LOS - 2   Chief Complaint  Patient presents with  . hyperkalemia      k = 6.2 yesterday        Assessment & Plan    1. Hyperkalemia due to being on Bactrim. Hyperkalemia has resolved after she received IV fluids, Lasix and Kayexalate. Bactrim has been stopped.   2. Right hip prosthesis MRSA infection. Needs at least one month of Zyvox thereafter per ID physician Joyce Gilmore, patient has been discharged from our standpoint yesterday we await case management to arrange for Zyvox.  Note prescriptions for Zyvox was provided to the case management 24 hours earlier on 03/14/2012, I was informed today at the case manager that patient's co-pay is going to be $3, few hours later I got another patient to call and patient's insurance company for prior authorization, I made a phone call myself and discussed the case with insurance agent personally who said the prior authorization was not needed for this medication whatsoever and the Zyvox should go through approval without any problems. I am unsure what the hold up for discharge at this time is 24 hours after formal discharge. I attempted calling back case manager again however went to her answering machine I have left a message.   3. Diabetes mellitus type 2 no acute issues continue present care glycemic control stable.  CBG (last 3)   Basename 03/15/12 0746 03/14/12 2127 03/14/12 1631  GLUCAP 156* 164* 114*      4. Chronic kidney disease stage III. Creatinine at baseline of around 1.3.      Code Status: full  Family Communication: daughter  Disposition Plan:  home   Procedures     Consults  ID, case management   DVT Prophylaxis  Heparin    Lab Results  Component Value Date   PLT 216 03/13/2012    Medications  Scheduled Meds:   . calcium-vitamin D  1 tablet Oral Daily  . heparin  5,000 Units Subcutaneous Q8H  . insulin aspart  0-9 Units Subcutaneous TID WC  . linezolid  600 mg Oral Custom  . vitamin B-6  50 mg Oral Daily  . sodium chloride  3 mL Intravenous Q12H   Continuous Infusions:  PRN Meds:.acetaminophen, acetaminophen, ondansetron (ZOFRAN) IV, ondansetron, polyethylene glycol  Antibiotics    Anti-infectives     Start     Dose/Rate Route Frequency Ordered Stop   03/14/12 0000   linezolid (ZYVOX) 600 MG tablet        600 mg Oral Every 12 hours 03/14/12 0949     03/13/12 2000   linezolid (ZYVOX) tablet 600 mg        600 mg Oral 2 times per day 03/13/12 1420  Time Spent in minutes  35   Joyce Gilmore on 03/15/2012 at 8:10 AM  Between 7am to 7pm - Pager - [phone]  After 7pm go to [email or web address] - password [email or web address]  And look for the night coverage person covering for me after hours  [facility] Office  [phone]    Subjective:   Joyce Gilmore today has, No headache, No chest pain, No abdominal pain - No Nausea, No new weakness tingling or numbness, No Cough - SOB.   Objective:   Filed Vitals:   03/14/12 1400 03/14/12 1753 03/14/12 2122 03/15/12 0459  BP: 121/64 144/63 117/67 107/68  Pulse: 76 67 68 66  Temp: 97.9 F (36.6 C) 97.8 F (36.6 C) 97.7 F (36.5 C) 97.9 F (36.6 C)  TempSrc: Oral Oral Oral Oral  Resp: 16 18 16 16   Height:      Weight:   67.5 kg (148 lb 13 oz)   SpO2: 95% 99% 97% 96%    Wt Readings from Last 3 Encounters:  03/14/12 67.5 kg (148 lb 13 oz)  03/09/12 67.586 kg (149 lb)  02/03/12 66.679 kg (147 lb)     Intake/Output Summary (Last 24 hours) at 03/15/12 0810 Last data filed at 03/15/12 0500  Gross per 24 hour  Intake    1440 ml  Output   3700 ml  Net  -2260 ml    Exam Awake Alert, Oriented X 3, No new F.N deficits, Normal affect [place].AT,PERRAL Supple Neck,No JVD, No cervical lymphadenopathy appriciated.  Symmetrical Chest wall movement, Good air movement bilaterally, CTAB RRR,No Gallops,Rubs or new Murmurs, No Parasternal Heave +ve B.Sounds, Abd Soft, Non tender, No organomegaly appriciated, No rebound - guarding or rigidity. No Cyanosis, Clubbing or edema, No new Rash or bruise    Data Review   Micro Results Recent Results (from the past 240 hour(s))  MRSA PCR SCREENING     Status: Normal   Collection Time   03/13/12  5:21 PM      Component Value Range Status Comment   MRSA by PCR NEGATIVE  NEGATIVE Final     Radiology Reports Dg Chest 2 View  03/13/2012  *RADIOLOGY REPORT*  Clinical Data: Elevated potassium  CHEST - 2 VIEW  Comparison: 01/02/2012; 12/04/2011; chest CT - 01/03/2012  Findings: Grossly unchanged borderline enlarged cardiac silhouette and mediastinal contours.  Unchanged left basilar heterogeneous opacities favored to represent atelectasis or scar.  There is mild eventration of the medial aspect of the right hemidiaphragm.  No pleural effusion or pneumothorax.  Unchanged bones.  IMPRESSION: 1.  No acute cardiopulmonary disease.  2. Unchanged chronic left basilar atelectasis/scar.   Original Report Authenticated By: Joyce Ruiz, MD     CBC  Lab 03/13/12 1412 03/13/12 1015 03/09/12 1112  WBC 6.9 4.4 8.5  HGB 10.4* 10.4* 10.8*  HCT 31.6* 32.0* 33.9*  PLT 216 210 246  MCV 82.5 82.5 82.7  MCH 27.2 26.8 26.3  MCHC 32.9 32.5 31.9  RDW 15.8* 15.8* 16.1*  LYMPHSABS -- 1.3 1.5  MONOABS -- 0.3 0.5  EOSABS -- 0.1 0.1  BASOSABS -- 0.0 0.0  BANDABS -- -- --    Chemistries   Lab 03/15/12 0627 03/14/12 0823 03/14/12 0147 03/13/12 2025 03/13/12 1412 03/13/12 1015 03/09/12 1112  NA 132* 131* 131* 131* 131* -- --  K 4.4 4.8 5.2* 4.9 4.8 -- --  CL 102 102 102 102 101 -- --  CO2 21 18*  19 18* 16* -- --  GLUCOSE 147*  164* 137* 135* 72 -- --  BUN 25* 24* 27* 29* 24* -- --  CREATININE 1.37* 1.37* 1.53* 1.68* 1.48* -- --  CALCIUM 9.1 9.7 9.4 9.0 10.2 -- --  MG -- -- -- -- -- -- --  AST -- -- -- -- -- 30 32  ALT -- -- -- -- -- 23 23  ALKPHOS -- -- -- -- -- 67 60  BILITOT -- -- -- -- -- 0.2* 0.2*   ------------------------------------------------------------------------------------------------------------------ estimated creatinine clearance is 28.8 ml/min (by C-G formula based on Cr of 1.37). ------------------------------------------------------------------------------------------------------------------  [facility] 03/13/12 1412  HGBA1C 6.5*   ------------------------------------------------------------------------------------------------------------------ No results found for this basename: CHOL:2,HDL:2,LDLCALC:2,TRIG:2,CHOLHDL:2,LDLDIRECT:2 in the last 72 hours ------------------------------------------------------------------------------------------------------------------ No results found for this basename: TSH,T4TOTAL,FREET3,T3FREE,THYROIDAB in the last 72 hours ------------------------------------------------------------------------------------------------------------------ No results found for this basename: VITAMINB12:2,FOLATE:2,FERRITIN:2,TIBC:2,IRON:2,RETICCTPCT:2 in the last 72 hours  Coagulation profile No results found for this basename: INR:5,PROTIME:5 in the last 168 hours  No results found for this basename: DDIMER:2 in the last 72 hours  Cardiac Enzymes No results found for this basename: CK:3,CKMB:3,TROPONINI:3,MYOGLOBIN:3 in the last 168 hours ------------------------------------------------------------------------------------------------------------------ No components found with this basename: POCBNP:3

## 2012-03-15 NOTE — Care Management Note (Signed)
   CARE MANAGEMENT NOTE 03/15/2012  Patient:  Joyce Gilmore, Joyce Gilmore   Account Number:  [id number]  Date Initiated:  03/15/2012  Documentation initiated by:  Hargun Spurling  Subjective/Objective Assessment:   Request to assist pt with prescription for Zyvox. Pt has Medicaid.     Action/Plan:   Per [facility] on [address], this pt Medicaid will pay for Zyvox with $3 copay.  No HH needs identified, pt ambulatory.   Anticipated DC Date:  03/15/2012   Anticipated DC Plan:  HOME/SELF CARE         Choice offered to / List presented to:             Status of service:  Completed, signed off Medicare Important Message given?   (If response is "NO", the following Medicare IM given date fields will be blank) Date Medicare IM given:   Date Additional Medicare IM given:    Discharge Disposition:  HOME/SELF CARE  Per UR Regulation:    If discussed at Long Length of Stay Meetings, dates discussed:    Comments:

## 2012-03-15 NOTE — Care Management Note (Signed)
This CM notified that pt would need Zyvox for 6 weeks. Noted that CM Isidoro Donning contacted pt pharmacy on Sunday and was told that pt medicaid number was not valid. This CM checked with hospital finance dept which varified that pt Medicaid was active. Again contacted pt pharmacy [facility] on [address] and [place] was validated with that pharmacy. Pt info and prescription faxed to that pharmacy, await response from pharmacy re pt copay information or if  Zyvox will be covered by Medicaid. Johny Shock RN MPH [phone] Call placed to [facility] pharmacy. Zyvox will be covered per that pharmacy.Marland Kitchen Pt MD, Dr Thedore Mins notified and to be d/c with $3 copay per [facility].  Johny Shock RN MPH Case Manager [phone]

## 2012-03-15 NOTE — Plan of Care (Signed)
Problem: Food- and Nutrition-Related Knowledge Deficit (NB-1.1) Goal: Nutrition education Formal process to instruct or train a patient/client in a skill or to impart knowledge to help patients/clients voluntarily manage or modify food choices and eating behavior to maintain or improve health.  Outcome: Completed/Met Date Met:  03/15/12  Nutrition Education Note  RD drawn to chart secondary to current prescription of zyvox, a medication with potential interactions with high tyramine-containing foods.  RD provided "Tyramine-Restricted Nutrition Therapy" handout from the Academy of Nutrition and Dietetics and discussed importance of this restriction while taking current medication. Reviewed list of foods to avoid. Teach back method used. RD used interpreter [id number], Domingo Cocking from [employer] to discuss information with patient.  Expect good compliance. Discussed information with RN and recommended sharing information with family when arrives.  Body mass index is 31.10 kg/(m^2). Pt meets criteria for Obese Class I based on current BMI.  Current diet order is Renal 80-90, patient is consuming approximately 100% of meals at this time. Additional labs and medications reviewed.   RD will add Low Tyramine Diet restrictions to diet order to prevent potential food-medication interaction.   No further nutrition interventions warranted at this time. RD contact information provided. If additional nutrition issues arise, please re-consult RD.  Jarold Motto MS, RD, LDN Pager: [phone] After-hours pager: [phone]

## 2012-03-16 LAB — URINE CULTURE: Colony Count: >=100000

## 2012-03-16 LAB — GLUCOSE, CAPILLARY: Glucose-Capillary: 184 mg/dL — ABNORMAL HIGH (ref 70–99)

## 2012-03-22 ENCOUNTER — Other Ambulatory Visit: Payer: Self-pay | Admitting: Physician Assistant

## 2012-03-22 DIAGNOSIS — Z1231 Encounter for screening mammogram for malignant neoplasm of breast: Secondary | ICD-10-CM

## 2012-03-23 ENCOUNTER — Ambulatory Visit (INDEPENDENT_AMBULATORY_CARE_PROVIDER_SITE_OTHER): Payer: Medicaid Other | Admitting: Infectious Disease

## 2012-03-23 ENCOUNTER — Encounter: Payer: Self-pay | Admitting: Infectious Disease

## 2012-03-23 VITALS — BP 156/106 | HR 76 | Temp 97.5°F | Wt 150.0 lb

## 2012-03-23 DIAGNOSIS — Z96649 Presence of unspecified artificial hip joint: Secondary | ICD-10-CM

## 2012-03-23 DIAGNOSIS — T8450XA Infection and inflammatory reaction due to unspecified internal joint prosthesis, initial encounter: Secondary | ICD-10-CM

## 2012-03-23 DIAGNOSIS — T8451XA Infection and inflammatory reaction due to internal right hip prosthesis, initial encounter: Secondary | ICD-10-CM

## 2012-03-23 LAB — CBC WITH DIFFERENTIAL/PLATELET
Basophils Absolute: 0.1 10*3/uL (ref 0.0–0.1)
Basophils Relative: 1 % (ref 0–1)
Eosinophils Relative: 2 % (ref 0–5)
Lymphocytes Relative: 34 % (ref 12–46)
MCHC: 33 g/dL (ref 30.0–36.0)
MCV: 81.5 fL (ref 78.0–100.0)
Platelets: 141 10*3/uL — ABNORMAL LOW (ref 150–400)
RDW: 16.5 % — ABNORMAL HIGH (ref 11.5–15.5)
WBC: 6.3 10*3/uL (ref 4.0–10.5)

## 2012-03-23 LAB — BASIC METABOLIC PANEL WITH GFR
BUN: 20 mg/dL (ref 6–23)
Calcium: 9.1 mg/dL (ref 8.4–10.5)
GFR, Est African American: 59 mL/min — ABNORMAL LOW
GFR, Est Non African American: 51 mL/min — ABNORMAL LOW
Potassium: 4.6 mEq/L (ref 3.5–5.3)
Sodium: 134 mEq/L — ABNORMAL LOW (ref 135–145)

## 2012-03-23 LAB — C-REACTIVE PROTEIN: CRP: <0.5 mg/dL (ref <0.60)

## 2012-03-23 NOTE — Progress Notes (Signed)
  Subjective:    Patient ID: Joyce Gilmore, female    DOB: 28-Jan-1935, 78 y.o.   MRN: [id number]  HPI   80 MRSA prostheic hip infection who had been changed to bactrim with plans to complete additional 2 months of therapy orally when she developed problems with hyperkalemia and renal insufficiency [facility]. I changed her to oral Zyvox 600 mg twice daily and she's been continued on this since her recent admission earlier this month. The plan is for her to potentially completed a month of Zyvox if she does not develop bone marrow toxicity. She doing well and claims to have no pain in her hip at all except for some walking. She has had no fevers chills nausea malaise or other systemic symptoms she has no history of recent epistaxis or evidence of bleeding.   Review of Systems  Constitutional: Negative for fever, chills, diaphoresis, activity change, appetite change, fatigue and unexpected weight change.  HENT: Negative for congestion, sore throat, rhinorrhea, sneezing, trouble swallowing and sinus pressure.   Eyes: Negative for photophobia and visual disturbance.  Respiratory: Negative for cough, chest tightness, shortness of breath, wheezing and stridor.   Cardiovascular: Negative for chest pain, palpitations and leg swelling.  Gastrointestinal: Negative for nausea, vomiting, abdominal pain, diarrhea, constipation, blood in stool, abdominal distention and anal bleeding.  Genitourinary: Negative for dysuria, hematuria, flank pain and difficulty urinating.  Musculoskeletal: Negative for myalgias, back pain, joint swelling, arthralgias and gait problem.  Skin: Negative for color change, pallor, rash and wound.  Neurological: Negative for dizziness, tremors, weakness and light-headedness.  Hematological: Negative for adenopathy. Does not bruise/bleed easily.  Psychiatric/Behavioral: Negative for behavioral problems, confusion, sleep disturbance, dysphoric mood, decreased  concentration and agitation.       Objective:   Physical Exam  Constitutional: She is oriented to person, place, and time. She appears well-developed and well-nourished. No distress.  HENT:  Head: Normocephalic and atraumatic.  Mouth/Throat: Oropharynx is clear and moist. No oropharyngeal exudate.  Eyes: Conjunctivae normal and EOM are normal. Pupils are equal, round, and reactive to light. No scleral icterus.  Neck: Normal range of motion. Neck supple. No JVD present.  Cardiovascular: Normal rate, regular rhythm and normal heart sounds.  Exam reveals no gallop and no friction rub.   No murmur heard. Pulmonary/Chest: Effort normal and breath sounds normal. No respiratory distress. She has no wheezes. She has no rales. She exhibits no tenderness.  Abdominal: She exhibits no distension and no mass. There is no tenderness. There is no rebound and no guarding.  Musculoskeletal: She exhibits no edema and no tenderness.       Legs: Lymphadenopathy:    She has no cervical adenopathy.  Neurological: She is alert and oriented to person, place, and time. She has normal reflexes. She exhibits normal muscle tone. Coordination normal.  Skin: Skin is warm and dry. She is not diaphoretic.  Psychiatric: She has a normal mood and affect. Her behavior is normal. Judgment and thought content normal.          Assessment & Plan:  MRSA prosthetic hip infection: check labs today, try to push labs for another 2 weeks with recheck lab next week in between. Consider change to low dose bactrim at one tablet daily if pt intolerant of zyvox or needs further therapy. Will check esr and crp agiain today  Hyperkalemia: resolved.  Renal insufficiency resolved.

## 2012-03-23 NOTE — Patient Instructions (Addendum)
We need blood work today  appt for blood work in 1 week  And   appt with Dr. Luciana Axe or Dr. Daiva Eves (or partner) in 2 weeks

## 2012-03-24 ENCOUNTER — Telehealth: Payer: Self-pay | Admitting: Infectious Disease

## 2012-03-24 ENCOUNTER — Other Ambulatory Visit: Payer: Self-pay | Admitting: Licensed Clinical Social Worker

## 2012-03-24 DIAGNOSIS — T8459XA Infection and inflammatory reaction due to other internal joint prosthesis, initial encounter: Secondary | ICD-10-CM

## 2012-03-24 MED ORDER — SULFAMETHOXAZOLE-TMP DS 800-160 MG PO TABS: 1.0000 | ORAL_TABLET | Freq: Every day | ORAL | Status: DC

## 2012-03-24 NOTE — Telephone Encounter (Signed)
Patient is developing thrombocytopenia with platelets dropping. Highly likely due to zyvox toxicity.  She must stop zyvox immediately.  I think we can try LOW dose TMP/SMX at ONE DS tablet daily to see if this will NOT cause significant hyperkalemia or creatinine elevation.

## 2012-03-24 NOTE — Telephone Encounter (Signed)
Done, called it in and patient is aware

## 2012-03-25 ENCOUNTER — Ambulatory Visit (HOSPITAL_COMMUNITY)
Admission: RE | Admit: 2012-03-25 | Discharge: 2012-03-25 | Disposition: A | Discharge status: Home / Self Care | Payer: Medicaid Other | Source: Ambulatory Visit | Attending: Physician Assistant | Admitting: Physician Assistant

## 2012-03-25 DIAGNOSIS — Z1231 Encounter for screening mammogram for malignant neoplasm of breast: Secondary | ICD-10-CM | POA: Insufficient documentation

## 2012-03-30 ENCOUNTER — Other Ambulatory Visit (INDEPENDENT_AMBULATORY_CARE_PROVIDER_SITE_OTHER): Payer: Medicaid Other

## 2012-03-30 DIAGNOSIS — T8450XA Infection and inflammatory reaction due to unspecified internal joint prosthesis, initial encounter: Secondary | ICD-10-CM

## 2012-03-30 DIAGNOSIS — Z96649 Presence of unspecified artificial hip joint: Secondary | ICD-10-CM

## 2012-03-30 DIAGNOSIS — T8451XA Infection and inflammatory reaction due to internal right hip prosthesis, initial encounter: Secondary | ICD-10-CM

## 2012-03-30 LAB — CBC WITH DIFFERENTIAL/PLATELET
Basophils Absolute: 0 10*3/uL (ref 0.0–0.1)
Basophils Relative: 1 % (ref 0–1)
Eosinophils Absolute: 0.2 10*3/uL (ref 0.0–0.7)
Hemoglobin: 9.6 g/dL — ABNORMAL LOW (ref 12.0–15.0)
MCH: 26.9 pg (ref 26.0–34.0)
MCHC: 32.4 g/dL (ref 30.0–36.0)
Monocytes Relative: 10 % (ref 3–12)
Neutro Abs: 3.1 10*3/uL (ref 1.7–7.7)
Neutrophils Relative %: 53 % (ref 43–77)
Platelets: 148 10*3/uL — ABNORMAL LOW (ref 150–400)
RDW: 16.5 % — ABNORMAL HIGH (ref 11.5–15.5)

## 2012-04-08 ENCOUNTER — Encounter: Payer: Self-pay | Admitting: *Deleted

## 2012-04-12 ENCOUNTER — Encounter: Payer: Self-pay | Admitting: Infectious Disease

## 2012-04-12 ENCOUNTER — Ambulatory Visit (INDEPENDENT_AMBULATORY_CARE_PROVIDER_SITE_OTHER): Payer: Medicaid Other | Admitting: Infectious Disease

## 2012-04-12 VITALS — BP 146/84 | HR 78 | Temp 97.6°F | Ht 60.0 in | Wt 152.0 lb

## 2012-04-12 DIAGNOSIS — N289 Disorder of kidney and ureter, unspecified: Secondary | ICD-10-CM

## 2012-04-12 DIAGNOSIS — A4902 Methicillin resistant Staphylococcus aureus infection, unspecified site: Secondary | ICD-10-CM

## 2012-04-12 DIAGNOSIS — D696 Thrombocytopenia, unspecified: Secondary | ICD-10-CM

## 2012-04-12 DIAGNOSIS — T8451XA Infection and inflammatory reaction due to internal right hip prosthesis, initial encounter: Secondary | ICD-10-CM

## 2012-04-12 DIAGNOSIS — Z96649 Presence of unspecified artificial hip joint: Secondary | ICD-10-CM

## 2012-04-12 DIAGNOSIS — T8450XA Infection and inflammatory reaction due to unspecified internal joint prosthesis, initial encounter: Secondary | ICD-10-CM

## 2012-04-12 DIAGNOSIS — E875 Hyperkalemia: Secondary | ICD-10-CM

## 2012-04-12 LAB — CBC WITH DIFFERENTIAL/PLATELET
Basophils Relative: 1 % (ref 0–1)
Eosinophils Absolute: 0.2 10*3/uL (ref 0.0–0.7)
Eosinophils Relative: 3 % (ref 0–5)
Lymphs Abs: 2.4 10*3/uL (ref 0.7–4.0)
MCH: 26.8 pg (ref 26.0–34.0)
MCHC: 32.8 g/dL (ref 30.0–36.0)
MCV: 81.8 fL (ref 78.0–100.0)
Neutrophils Relative %: 46 % (ref 43–77)
Platelets: 233 10*3/uL (ref 150–400)
RBC: 3.73 MIL/uL — ABNORMAL LOW (ref 3.87–5.11)

## 2012-04-12 LAB — BASIC METABOLIC PANEL WITH GFR
BUN: 23 mg/dL (ref 6–23)
CO2: 25 mEq/L (ref 19–32)
Calcium: 9.9 mg/dL (ref 8.4–10.5)
Creat: 1.36 mg/dL — ABNORMAL HIGH (ref 0.50–1.10)
GFR, Est African American: 44 mL/min — ABNORMAL LOW
Glucose, Bld: 119 mg/dL — ABNORMAL HIGH (ref 70–99)

## 2012-04-12 NOTE — Progress Notes (Signed)
  Subjective:    Patient ID: Joyce Gilmore, female    DOB: 23-Jun-1934, 78 y.o.   MRN: [id number]  HPI  61 MRSA prostheic hip infection who had been changed to bactrim with plans to complete additional 2 months of therapy orally when she developed problems with hyperkalemia and renal insufficiency for which she was hospitalized.  I changed her to oral Zyvox 600 mg twice daily and  Continued on this until I saw her at the end of last month. By this time she had developed thrombocytopenia with a platelet count 143. Stop Zyvox lift and placed her back on lower dose Bactrim one double check tablet once daily which she's been taking since then. She states that she has some sensation of suture still being in place the ureteric times. Otherwise she has only minimal pain with walking which is not severe. At rest she has no pain she has no fevers chills nausea or malaise.  Review of Systems  Constitutional: Negative for fever, chills, diaphoresis, activity change, appetite change, fatigue and unexpected weight change.  HENT: Negative for congestion, sore throat, rhinorrhea, sneezing, trouble swallowing and sinus pressure.   Eyes: Negative for photophobia and visual disturbance.  Respiratory: Negative for cough, chest tightness, shortness of breath, wheezing and stridor.   Cardiovascular: Negative for chest pain, palpitations and leg swelling.  Gastrointestinal: Negative for nausea, vomiting, abdominal pain, diarrhea, constipation, blood in stool, abdominal distention and anal bleeding.  Genitourinary: Negative for dysuria, hematuria, flank pain and difficulty urinating.  Musculoskeletal: Negative for myalgias, back pain, joint swelling, arthralgias and gait problem.  Skin: Negative for color change, pallor, rash and wound.  Neurological: Negative for dizziness, tremors, weakness and light-headedness.  Hematological: Negative for adenopathy. Does not bruise/bleed easily.  Psychiatric/Behavioral:  Negative for behavioral problems, confusion, sleep disturbance, dysphoric mood, decreased concentration and agitation.       Objective:   Physical Exam  Constitutional: She is oriented to person, place, and time. No distress.  HENT:  Head: Normocephalic and atraumatic.  Mouth/Throat: Oropharynx is clear and moist. No oropharyngeal exudate.  Eyes: Conjunctivae and EOM are normal. Pupils are equal, round, and reactive to light. No scleral icterus.  Neck: Normal range of motion. Neck supple.  Cardiovascular: Normal rate, regular rhythm and normal heart sounds.   Pulmonary/Chest: Effort normal and breath sounds normal. No respiratory distress. She has no wheezes.  Abdominal: She exhibits no distension and no mass.  Musculoskeletal:       Legs: Lymphadenopathy:    She has no cervical adenopathy.  Neurological: She is alert and oriented to person, place, and time. She has normal reflexes. She exhibits normal muscle tone. Coordination normal.  Skin: Skin is warm. She is not diaphoretic. No erythema. No pallor.  Psychiatric: She has a normal mood and affect. Her behavior is normal. Judgment and thought content normal.          Assessment & Plan:  Prosthetic joint infection: Will stop her oral Bactrim today check sedimentation rate C-reactive protein and labs. We'll bring back in one month's time advised patient to call me if she has any increase in her baseline pain.  MRSA infection: Recommended decolonization prior to any future elective surgeries.  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  Hyperkalemia: Part of this was due to hyperkalemia due to Bactrim in the past. Recheck labs today.  Renal insufficiency this had resolved at last clinic visit will recheck metabolic panel today.

## 2012-04-28 ENCOUNTER — Encounter: Payer: Self-pay | Admitting: Internal Medicine

## 2012-05-12 ENCOUNTER — Ambulatory Visit (INDEPENDENT_AMBULATORY_CARE_PROVIDER_SITE_OTHER): Payer: Medicaid Other | Admitting: Infectious Disease

## 2012-05-12 ENCOUNTER — Encounter: Payer: Self-pay | Admitting: Infectious Disease

## 2012-05-12 VITALS — BP 178/86 | HR 88 | Temp 98.0°F | Wt 151.5 lb

## 2012-05-12 DIAGNOSIS — T8451XD Infection and inflammatory reaction due to internal right hip prosthesis, subsequent encounter: Secondary | ICD-10-CM

## 2012-05-12 DIAGNOSIS — Z5189 Encounter for other specified aftercare: Secondary | ICD-10-CM

## 2012-05-12 NOTE — Progress Notes (Signed)
Subjective:    Patient ID: Joyce Gilmore, female    DOB: [birth year], 77 y.o.   MRN: [id number]  HPI   57 MRSA prostheic hip infection who had been changed to bactrim with plans to complete additional 2 months of therapy orally when she developed problems with hyperkalemia and renal insufficiency for which she was hospitalized.  I changed her to oral Zyvox 600 mg twice daily and  Continued on this until I saw her at the end of last January. By this time she had developed thrombocytopenia with a platelet count 143. Stop Zyvox placed her back on lower dose Bactrim one double check tablet once daily which she took until she saw me in mid February. Since then her inflammatory markers have normalized we took her off her antibiotics. She presents today for followup in our clinic.   She continues to be bothered by a sensation of suture still being in place.Otherwise she has only minimal pain in her lower back  with walking which is not severe. At rest she has no pain she has no fevers chills nausea or malaise.  Does have some significant depressive symptoms of depression, and anxiety anhedonia but denies passive or active suicidal ideation. She is contracted for safety and will followup with her primary care physician.  Review of Systems  Constitutional: Negative for fever, chills, diaphoresis, activity change, appetite change, fatigue and unexpected weight change.  HENT: Negative for congestion, sore throat, rhinorrhea, sneezing, trouble swallowing and sinus pressure.   Eyes: Negative for photophobia and visual disturbance.  Respiratory: Negative for cough, chest tightness, shortness of breath, wheezing and stridor.   Cardiovascular: Negative for chest pain, palpitations and leg swelling.  Gastrointestinal: Negative for nausea, vomiting, abdominal pain, diarrhea, constipation, blood in stool, abdominal distention and anal bleeding.  Genitourinary: Negative for dysuria, hematuria, flank  pain and difficulty urinating.  Musculoskeletal: Negative for myalgias, back pain, joint swelling, arthralgias and gait problem.  Skin: Negative for color change, pallor, rash and wound.  Neurological: Negative for dizziness, tremors, weakness and light-headedness.  Hematological: Negative for adenopathy. Does not bruise/bleed easily.  Psychiatric/Behavioral: Positive for dysphoric mood and decreased concentration. Negative for suicidal ideas, behavioral problems, confusion, sleep disturbance and agitation. The patient is nervous/anxious.        Objective:   Physical Exam  Constitutional: She is oriented to person, place, and time. No distress.  HENT:  Head: Normocephalic and atraumatic.  Mouth/Throat: Oropharynx is clear and moist. No oropharyngeal exudate.  Eyes: Conjunctivae and EOM are normal. Pupils are equal, round, and reactive to light. No scleral icterus.  Neck: Normal range of motion. Neck supple.  Cardiovascular: Normal rate, regular rhythm and normal heart sounds.   Pulmonary/Chest: Effort normal and breath sounds normal. No respiratory distress. She has no wheezes.  Abdominal: She exhibits no distension and no mass.  Musculoskeletal:       Legs: Lymphadenopathy:    She has no cervical adenopathy.  Neurological: She is alert and oriented to person, place, and time. She exhibits normal muscle tone. Coordination normal.  Skin: Skin is warm. She is not diaphoretic. No erythema. No pallor.  Psychiatric: She has a normal mood and affect. Her behavior is normal. Judgment and thought content normal.          Assessment & Plan:  Prosthetic joint infection: Continue to observe off of antibiotics  re-check sedimentation rate C-reactive protein    MRSA infection: Recommended decolonization prior to any future elective surgeries.  Depression: contracted for  safety will fu with pcp

## 2012-05-13 ENCOUNTER — Ambulatory Visit: Payer: Self-pay | Admitting: Internal Medicine

## 2012-05-13 LAB — C-REACTIVE PROTEIN: CRP: <0.5 mg/dL (ref <0.60)

## 2012-05-20 ENCOUNTER — Ambulatory Visit (INDEPENDENT_AMBULATORY_CARE_PROVIDER_SITE_OTHER): Payer: Medicaid Other | Admitting: Family Medicine

## 2012-05-20 ENCOUNTER — Encounter: Payer: Self-pay | Admitting: Family Medicine

## 2012-05-20 VITALS — BP 112/64 | HR 72 | Temp 97.7°F | Resp 14 | Wt 150.0 lb

## 2012-05-20 DIAGNOSIS — R300 Dysuria: Secondary | ICD-10-CM

## 2012-05-20 DIAGNOSIS — I209 Angina pectoris, unspecified: Secondary | ICD-10-CM

## 2012-05-20 DIAGNOSIS — E1169 Type 2 diabetes mellitus with other specified complication: Secondary | ICD-10-CM

## 2012-05-20 DIAGNOSIS — E1159 Type 2 diabetes mellitus with other circulatory complications: Secondary | ICD-10-CM

## 2012-05-20 DIAGNOSIS — R079 Chest pain, unspecified: Secondary | ICD-10-CM

## 2012-05-20 DIAGNOSIS — N390 Urinary tract infection, site not specified: Secondary | ICD-10-CM

## 2012-05-20 LAB — URINALYSIS, ROUTINE W REFLEX MICROSCOPIC
Bilirubin Urine: NEGATIVE
Glucose, UA: NEGATIVE mg/dL
Ketones, ur: NEGATIVE mg/dL
Specific Gravity, Urine: 1.02 (ref 1.005–1.030)

## 2012-05-20 LAB — URINALYSIS, MICROSCOPIC ONLY: Crystals: NONE SEEN

## 2012-05-20 MED ORDER — CIPROFLOXACIN HCL 500 MG PO TABS: 500.0000 mg | ORAL_TABLET | Freq: Two times a day (BID) | ORAL | Status: DC

## 2012-05-20 NOTE — Patient Instructions (Addendum)
Go to ER immediately if chest pain begins at rest.  Otherwise, take aspirin 325 mg per day until seen by cardiology.

## 2012-05-20 NOTE — Progress Notes (Signed)
Subjective:     Patient ID: Joyce Gilmore, female   DOB: 10-Oct-1934, 78 y.o.   MRN: [id number]  HPI Patient presents with low back pain and dysuria x2-3 days.  She denies fever nausea or vomiting. She denies symptoms of systemic illness.    However, she also reports substernal to left sided chest pain that occurs when she walks and improves when she rests.  This is associated with shortness of breath.  She denies nausea or radiation of the pain down her arm.  She denies palpitations, syncope, orthopnea, peripheral edema, paroxysmal nocturnal dyspnea.  Her past medical history is complicated by diabetes mellitus type 2, hypertension, and hyperlipidemia..  presently her blood pressure is well controlled at 112/64.  Her last hemoglobin A1c was 6.4 in February 2014.  I do not have a recent LDL to review.   Past Medical History  Diagnosis Date  . Hypertension   . Depression     hx of   . Diabetes mellitus without complication   . Chronic kidney disease     hx of kidney stones,   . Arthritis   . Hearing loss of both ears   . Tinnitus of both ears   . MRSA infection     Oct 13 - Nov 13  . Ganglion, left ankle and foot    Current Outpatient Prescriptions on File Prior to Visit  Medication Sig Dispense Refill  . calcium-vitamin D (OSCAL WITH D) 500-200 MG-UNIT per tablet Take 1 tablet by mouth daily.      Marland Kitchen glipiZIDE (GLUCOTROL XL) 5 MG 24 hr tablet Take 5 mg by mouth daily.      . metFORMIN (GLUCOPHAGE) 1000 MG tablet Take 1,000 mg by mouth 2 (two) times daily with a meal.       No current facility-administered medications on file prior to visit.   History   Social History  . Marital Status: Widowed    Spouse Name: N/A    Number of Children: N/A  . Years of Education: N/A   Occupational History  . Not on file.   Social History Main Topics  . Smoking status: Never Smoker   . Smokeless tobacco: Never Used  . Alcohol Use: No  . Drug Use: No  . Sexually Active: Not on  file   Other Topics Concern  . Not on file   Social History Narrative   ** Merged History Encounter **         Review of Systems    review of systems is negative except that stated in the history of present illness. Objective:   Physical Exam  Constitutional: She appears well-developed and well-nourished. No distress.  HENT:  Head: Normocephalic.  Right Ear: External ear normal.  Left Ear: External ear normal.  Nose: Nose normal.  Mouth/Throat: Oropharynx is clear and moist. No oropharyngeal exudate.  Eyes: Conjunctivae are normal. Pupils are equal, round, and reactive to light.  Neck: Normal range of motion. Neck supple. No JVD present. No thyromegaly present.  Cardiovascular: Normal rate, regular rhythm and normal heart sounds.   No murmur heard. Pulmonary/Chest: Effort normal and breath sounds normal. No respiratory distress. She has no wheezes. She has no rales.  Abdominal: Soft. Bowel sounds are normal.  Lymphadenopathy:    She has no cervical adenopathy.  Skin: She is not diaphoretic.   she has no peripheral edema. EKG reveals normal sinus rhythm at 67 beats per minute with normal intervals and a normal axis the there  are no Q waves nor ST changes consistent with ischemia or infarction.  Normal ekg.    Assessment:     Angina pectoris Diabetes mellitus type 2 Urinary tract infection     Plan:     1. Dysuria - Urinalysis, Routine w reflex microscopic - ciprofloxacin (CIPRO) 500 MG tablet; Take 1 tablet (500 mg total) by mouth 2 (two) times daily.  Dispense: 20 tablet; Refill: 0  2. Chest pain - EKG 12-Lead - Ambulatory referral to Cardiology  3. Urinary tract infection, site not specified - ciprofloxacin (CIPRO) 500 MG tablet; Take 1 tablet (500 mg total) by mouth 2 (two) times daily.  Dispense: 20 tablet; Refill: 0  4. Angina pectoris associated with type 2 diabetes mellitus - Ambulatory referral to Cardiology    patient is instructed to begin aspirin  325 by mouth daily.  At present her symptoms sound like typical angina. Therefore I do not feel she needs to go the emergency room. However I told her if chest pain develops at rest this is consistent with unstable angina and requires emergency room evaluation to rule out ACS.  Both the patient and her niece express understanding.   Otherwise take Cipro 500 mg by mouth twice a day x7 days to treat urinary tract infection. Immediately if symptoms worsen or change.

## 2012-06-02 ENCOUNTER — Ambulatory Visit (AMBULATORY_SURGERY_CENTER): Payer: Medicaid Other | Admitting: *Deleted

## 2012-06-02 VITALS — Ht 60.0 in | Wt 150.0 lb

## 2012-06-02 DIAGNOSIS — Z1211 Encounter for screening for malignant neoplasm of colon: Secondary | ICD-10-CM

## 2012-06-02 NOTE — Progress Notes (Signed)
Pt has MoviPrep at home from previously scheduled procedure.  Interpreter and daughter present for instructions.

## 2012-06-14 ENCOUNTER — Encounter: Payer: Self-pay | Admitting: *Deleted

## 2012-06-15 ENCOUNTER — Ambulatory Visit (AMBULATORY_SURGERY_CENTER): Payer: Medicaid Other | Admitting: Internal Medicine

## 2012-06-15 ENCOUNTER — Encounter: Payer: Self-pay | Admitting: Internal Medicine

## 2012-06-15 ENCOUNTER — Other Ambulatory Visit: Payer: Self-pay | Admitting: Internal Medicine

## 2012-06-15 VITALS — BP 149/85 | HR 65 | Temp 96.6°F | Resp 17 | Ht 60.0 in | Wt 150.0 lb

## 2012-06-15 DIAGNOSIS — Z1211 Encounter for screening for malignant neoplasm of colon: Secondary | ICD-10-CM

## 2012-06-15 DIAGNOSIS — D126 Benign neoplasm of colon, unspecified: Secondary | ICD-10-CM

## 2012-06-15 MED ORDER — SODIUM CHLORIDE 0.9 % IV SOLN: 500.0000 mL | INTRAVENOUS | Status: DC

## 2012-06-15 NOTE — Op Note (Signed)
[facility] [address] [place], [address]   COLONOSCOPY PROCEDURE REPORT  PATIENT: Joyce Gilmore, Joyce Gilmore  MR#: [id number] BIRTHDATE: 28-Nov-1934 , 77  yrs. old GENDER: Female ENDOSCOPIST: Beverley Fiedler, MD REFERRED IO:NGEX Dionicio Stall, M.D. PROCEDURE DATE:  06/15/2012 PROCEDURE:   Colonoscopy with cold biopsy polypectomy ASA CLASS:   Class III INDICATIONS:average risk screening and first colonoscopy. MEDICATIONS: MAC sedation, administered by CRNA and Propofol (Diprivan) 90 mg IV  DESCRIPTION OF PROCEDURE:   After the risks benefits and alternatives of the procedure were thoroughly explained, informed consent was obtained.  A digital rectal exam revealed no rectal mass.   The LB [id number]-[id number]  endoscope was introduced through the anus and advanced to the cecum, which was identified by both the appendix and ileocecal valve. No adverse events experienced. The quality of the prep was good, using MoviPrep  The instrument was then slowly withdrawn as the colon was fully examined.   COLON FINDINGS: Mild diverticulosis was noted in the descending colon and sigmoid colon.   A sessile polyp measuring 3 mm in size was found in the sigmoid colon.  A polypectomy was performed with cold forceps.  The resection was complete and the polyp tissue was completely retrieved.   The colon mucosa was otherwise normal. Retroflexed views revealed no abnormalities. The time to cecum=2 minutes 12 seconds.  Withdrawal time=10 minutes 48 seconds.  The scope was withdrawn and the procedure completed. COMPLICATIONS: There were no complications.  ENDOSCOPIC IMPRESSION: 1.   Mild diverticulosis was noted in the descending colon and sigmoid colon 2.   Sessile polyp measuring 3 mm in size was found in the sigmoid colon; polypectomy was performed with cold forceps 3.   The colon mucosa was otherwise normal  RECOMMENDATIONS: 1.  Await pathology results 2.  High fiber diet 3.   Given your age, you may not need another colonoscopy for colon cancer screening or polyp surveillance.  These types of tests usually stop around the age 75.  Complete recommendation to be made after pathology results available.   eSigned:  Beverley Fiedler, MD 06/15/2012 11:50 AM   cc: The Patient

## 2012-06-15 NOTE — Patient Instructions (Addendum)

## 2012-06-15 NOTE — Progress Notes (Signed)
Propofol given over incremental dosages 

## 2012-06-15 NOTE — Progress Notes (Signed)
Patient did not experience any of the following events: a burn prior to discharge; a fall within the facility; wrong site/side/patient/procedure/implant event; or a hospital transfer or hospital admission upon discharge from the facility. (G8907) Patient did not have preoperative order for IV antibiotic SSI prophylaxis. (G8918)  

## 2012-06-15 NOTE — Progress Notes (Signed)
Called to room to assist during endoscopic procedure.  Patient ID and intended procedure confirmed with present staff. Received instructions for my participation in the procedure from the performing physician.  

## 2012-06-16 ENCOUNTER — Telehealth: Payer: Self-pay | Admitting: *Deleted

## 2012-06-16 NOTE — Telephone Encounter (Signed)
  Follow up Call-  Call back number 06/15/2012  Post procedure Call Back phone  # [phone] nieces #  Permission to leave phone message Yes     No answer, left message.

## 2012-06-21 ENCOUNTER — Encounter: Payer: Self-pay | Admitting: Internal Medicine

## 2012-07-06 NOTE — Telephone Encounter (Signed)
Questions about how her hearing and I answered her questions.

## 2012-07-23 ENCOUNTER — Ambulatory Visit: Payer: Self-pay | Admitting: Physician Assistant

## 2012-07-26 ENCOUNTER — Encounter: Payer: Self-pay | Admitting: Physician Assistant

## 2012-07-26 ENCOUNTER — Ambulatory Visit (INDEPENDENT_AMBULATORY_CARE_PROVIDER_SITE_OTHER): Payer: Medicaid Other | Admitting: Physician Assistant

## 2012-07-26 VITALS — BP 144/80 | HR 76 | Temp 97.5°F | Resp 20 | Ht <= 58 in | Wt 153.0 lb

## 2012-07-26 DIAGNOSIS — E119 Type 2 diabetes mellitus without complications: Secondary | ICD-10-CM

## 2012-07-26 DIAGNOSIS — E785 Hyperlipidemia, unspecified: Secondary | ICD-10-CM

## 2012-07-26 DIAGNOSIS — E875 Hyperkalemia: Secondary | ICD-10-CM

## 2012-07-26 DIAGNOSIS — M810 Age-related osteoporosis without current pathological fracture: Secondary | ICD-10-CM

## 2012-07-26 DIAGNOSIS — I10 Essential (primary) hypertension: Secondary | ICD-10-CM

## 2012-07-26 MED ORDER — PRAVASTATIN SODIUM 40 MG PO TABS: 40.0000 mg | ORAL_TABLET | Freq: Every day | ORAL | Status: DC

## 2012-07-26 MED ORDER — SITAGLIPTIN PHOSPHATE 100 MG PO TABS: 100.0000 mg | ORAL_TABLET | Freq: Every day | ORAL | Status: DC

## 2012-07-26 MED ORDER — METFORMIN HCL 1000 MG PO TABS: 1000.0000 mg | ORAL_TABLET | Freq: Two times a day (BID) | ORAL | Status: DC

## 2012-07-26 MED ORDER — GLIPIZIDE ER 5 MG PO TB24: 5.0000 mg | ORAL_TABLET | Freq: Every day | ORAL | Status: DC

## 2012-07-26 MED ORDER — ALENDRONATE SODIUM 70 MG PO TABS: 70.0000 mg | ORAL_TABLET | ORAL | Status: DC

## 2012-07-26 NOTE — Progress Notes (Signed)
Patient ID: Joyce Gilmore MRN: [id number], DOB: [birth year], 78 y.o. Date of Encounter: @DATE @  Chief Complaint:  Chief Complaint  Patient presents with  . check up  has had cough,congestioin over weekend    HPI: 78 y.o. year old female  presents with her daughter who translates.   1. S/P MRSA of Prosthetic Hip 12/2011. Pt and daughter state that this all finally resolved.  2. DM: Taking meds as directed. No adv effects. Does not have BS log with her today. Says that BS has been good. Cannot tell me exact numbers. Tries to be fairly carely with diet. Does no routine exercise.  3. HLD: I realized Pravachol is not in currnet meds. Daughter says the bottles she brought in today are all of the medicines she is taking. They have no idea what happened with this. I think this "got lost" during all of her hospitalizations regarding her hip. 4. Osteoporsosis:  Foasamax is also "missing" from her curent meds. Again, they have no idea what happened with this. Again, I think it got "lost" during her hospitlaizations regarding her hip.   She has no complaint today except some mild runny nose for 2-3 days. No chest pressure, hjeaviness, thightness, increased SOB/DOE.   Past Medical History  Diagnosis Date  . Hypertension   . Depression     hx of   . Diabetes mellitus without complication   . Chronic kidney disease     hx of kidney stones,   . Arthritis   . Hearing loss of both ears   . Tinnitus of both ears   . MRSA infection     Oct 13 - Nov 13  . Ganglion, left ankle and foot   . Osteoporosis      Home Meds: See attached medication section for current medication list. Any medications entered into computer today will not appear on this note's list. The medications listed below were entered prior to today. Current Outpatient Prescriptions on File Prior to Visit  Medication Sig Dispense Refill  . aspirin 325 MG tablet Take 325 mg by mouth daily.      . calcium-vitamin D  (OSCAL WITH D) 500-200 MG-UNIT per tablet Take 1 tablet by mouth daily.       No current facility-administered medications on file prior to visit.    Allergies:  Allergies  Allergen Reactions  . Ace Inhibitors     Hyperkalemia  . Bactrim (Sulfamethoxazole W-Trimethoprim)     Hyperkalemia  . Rifampin Other (See Comments)    Severe thrombocytopenia  . Vancomycin Other (See Comments)    Severe thrombocytopenia    History   Social History  . Marital Status: Widowed    Spouse Name: N/A    Number of Children: N/A  . Years of Education: N/A   Occupational History  . Not on file.   Social History Main Topics  . Smoking status: Never Smoker   . Smokeless tobacco: Never Used  . Alcohol Use: No  . Drug Use: No  . Sexually Active: Not on file   Other Topics Concern  . Not on file   Social History Narrative   ** Merged History Encounter **        Family History  Problem Relation Age of Onset  . Colon cancer Neg Hx   . Hypertension Mother      Review of Systems:  See HPI for pertinent ROS. All other ROS negative.    Physical Exam: Blood pressure 144/80,  pulse 76, temperature 97.5 F (36.4 C), temperature source Oral, resp. rate 20, height 4' 8.5" (1.435 m), weight 153 lb (69.4 kg)., Body mass index is 33.7 kg/(m^2). General:WNWD Hispanic Female. Appears in no acute distress. Head: Normocephalic, atraumatic, eyes without discharge, sclera non-icteric, nares are without discharge. Bilateral auditory canals clear, TM's are without perforation, pearly grey and translucent with reflective cone of light bilaterally. Oral cavity moist, posterior pharynx without exudate, erythema, peritonsillar abscess, or post nasal drip.  Neck: Supple. No thyromegaly. No lymphadenopathy.No carotid Bruits. Lungs: Clear bilaterally to auscultation without wheezes, rales, or rhonchi. Breathing is unlabored. Heart: RRR with S1 S2. No murmurs, rubs, or gallops. Abdomen: Soft, non-tender,  non-distended with normoactive bowel sounds. No hepatomegaly. No rebound/guarding. No obvious abdominal masses. Musculoskeletal:  Strength and tone normal for age. Extremities/Skin: Warm and dry. No clubbing or cyanosis. No edema. No rashes or suspicious lesions. Neuro: Alert and oriented X 3. Moves all extremities spontaneously. Gait is normal. CNII-XII grossly in tact. Psych:  Responds to questions appropriately with a normal affect. DIABETIC FOOT EXAM;SEE ATTACHED SECTION     ASSESSMENT AND PLAN:  78 y.o. year old female with  1. DIABETES MELLITUS, TYPE II - Hemoglobin A1c - COMPLETE METABOLIC PANEL WITH GFR - Microalbumin, urine - glipiZIDE (GLUCOTROL XL) 5 MG 24 hr tablet; Take 1 tablet (5 mg total) by mouth daily.  Dispense: 90 tablet; Refill: 1 - metFORMIN (GLUCOPHAGE) 1000 MG tablet; Take 1 tablet (1,000 mg total) by mouth 2 (two) times daily with a meal.  Dispense: 180 tablet; Refill: 1 - sitaGLIPtin (JANUVIA) 100 MG tablet; Take 1 tablet (100 mg total) by mouth daily.  Dispense: 90 tablet; Refill: 1  2. HTN (hypertension) She had hyperkalemia 01/2012-was also on bactrim at that time-bactrim and ACE Inh were stopped.  Will recheck lab now. In future, may resume ACE Inh and monitor potassium. Will not esume this now as pt going out of country 08/04/12-10/08/2012. - COMPLETE METABOLIC PANEL WITH GFR  3. Hyperkalemia See #2 above. - COMPLETE METABOLIC PANEL WITH GFR  4. HYPERLIPIDEMIA See HPI. Resume med now. Recheck lab fasting when returns from trip. (Going out of country 08/04/12-10/08/2012) Was on this med at this dose in past with no adv effects.  - pravastatin (PRAVACHOL) 40 MG tablet; Take 1 tablet (40 mg total) by mouth daily.  Dispense: 90 tablet; Refill: 1 - COMPLETE METABOLIC PANEL WITH GFR   6. Osteoporosis See HPI. I think this med "got lost" with hospitalizations. Resume now.   I asked pt and daughter when this med was started. They think about 3-4 years ago. Will  cont it for total of 5 years then stop med. Last DEXA was 12/2010.  - alendronate (FOSAMAX) 70 MG tablet; Take 1 tablet (70 mg total) by mouth every 7 (seven) days. Take with a full glass of water on an empty stomach.  Dispense: 12 tablet; Refill: 3 - COMPLETE METABOLIC PANEL WITH GFR  7. Mammogram: 03/26/2012-Negative.  8. Screening Colonoscopy: 06/21/2012: Polyp: Based on Pathology, rec repeat 5 years. But, generally stop screening colonoscopy at a ge 82- GI Note recommends that she discuss this with her PCP at time of repeat at age 7 to see whether to do repeat or not.   9. Immunizations: Pneumovax -? Whether she ahs had        Tetanus: Had 04/2011         Zostavax: She was to check coverage with Insurance but they forgot. Reminded them today.  LABS:  SHE IS NOT FASTING. WILL CHECK OTHER LABS NOW. WILL WAIT TO RECHECK FLP AT NEXT OV. ALSO, SHE CURRENTLY IS ACCIDENTALLY OFF HER PRAVASTAIN ANY WAY. WILL RESUME THIS THEN RECHECK. SEE # 4 ABOVE.   Signed, [address] [place], [place], [place] 07/26/2012 12:12 PM

## 2012-07-27 ENCOUNTER — Other Ambulatory Visit (INDEPENDENT_AMBULATORY_CARE_PROVIDER_SITE_OTHER): Payer: Medicaid Other

## 2012-07-28 LAB — COMPLETE METABOLIC PANEL WITH GFR
ALT: 21 U/L (ref 0–35)
AST: 27 U/L (ref 0–37)
Alkaline Phosphatase: 65 U/L (ref 39–117)
CO2: 21 mEq/L (ref 19–32)
Chloride: 105 mEq/L (ref 96–112)
GFR, Est Non African American: 55 mL/min — ABNORMAL LOW
Glucose, Bld: 140 mg/dL — ABNORMAL HIGH (ref 70–99)
Potassium: 4.8 mEq/L (ref 3.5–5.3)
Total Bilirubin: 0.3 mg/dL (ref 0.3–1.2)
Total Protein: 7 g/dL (ref 6.0–8.3)

## 2012-10-13 ENCOUNTER — Ambulatory Visit: Payer: Self-pay | Admitting: Infectious Disease

## 2012-10-27 ENCOUNTER — Ambulatory Visit: Payer: Medicaid Other | Admitting: Physician Assistant

## 2012-11-09 ENCOUNTER — Encounter: Payer: Self-pay | Admitting: Infectious Disease

## 2012-11-09 ENCOUNTER — Ambulatory Visit (INDEPENDENT_AMBULATORY_CARE_PROVIDER_SITE_OTHER): Payer: Medicaid Other | Admitting: Infectious Disease

## 2012-11-09 VITALS — BP 156/83 | HR 84 | Temp 98.2°F | Ht 60.0 in | Wt 149.2 lb

## 2012-11-09 DIAGNOSIS — A4902 Methicillin resistant Staphylococcus aureus infection, unspecified site: Secondary | ICD-10-CM

## 2012-11-09 DIAGNOSIS — Z23 Encounter for immunization: Secondary | ICD-10-CM

## 2012-11-09 DIAGNOSIS — T889XXS Complication of surgical and medical care, unspecified, sequela: Secondary | ICD-10-CM

## 2012-11-09 DIAGNOSIS — T8451XS Infection and inflammatory reaction due to internal right hip prosthesis, sequela: Secondary | ICD-10-CM

## 2012-11-09 LAB — SEDIMENTATION RATE: Sed Rate: 10 mm/hr (ref 0–22)

## 2012-11-09 NOTE — Progress Notes (Signed)
  Subjective:    Patient ID: Joyce Gilmore, female    DOB: [birth year], 78 y.o.   MRN: [id number]  HPI   78 yo Hispanic lady with MRSA prostheic hip infection who had been changed to bactrim with plans to complete additional 2 months of therapy orally when she developed problems with hyperkalemia and renal insufficiency for which she was hospitalized.  I changed her to oral Zyvox 600 mg twice daily and  Continued on this until  end of last January. By this time she had developed thrombocytopenia with a platelet count 143. I stopped  Zyvox placed her back on lower dose Bactrim one double check tablet once daily which she took until she saw me in mid February. Since then her inflammatory markers have normalized we took her off her antibiotics She presents today for followup in our clinic after having been last seen by me in March.  She presents to our clinic for followup with symptoms of increasing pain with weightbearing and prolonged sitting. When she is lying in bed she is not in pain and she is not having fevers chills nausea or other systemic symptoms.   Review of Systems  Constitutional: Negative for fever, chills, diaphoresis, activity change, appetite change, fatigue and unexpected weight change.  HENT: Negative for congestion, sore throat, rhinorrhea, sneezing, trouble swallowing and sinus pressure.   Eyes: Negative for photophobia and visual disturbance.  Respiratory: Negative for cough, chest tightness, shortness of breath, wheezing and stridor.   Cardiovascular: Negative for chest pain, palpitations and leg swelling.  Gastrointestinal: Negative for nausea, vomiting, abdominal pain, diarrhea, constipation, blood in stool, abdominal distention and anal bleeding.  Genitourinary: Negative for dysuria, hematuria, flank pain and difficulty urinating.  Musculoskeletal: Negative for myalgias, back pain, joint swelling, arthralgias and gait problem.  Skin: Negative for color change,  pallor, rash and wound.  Neurological: Negative for dizziness, tremors, weakness and light-headedness.  Hematological: Negative for adenopathy. Does not bruise/bleed easily.  Psychiatric/Behavioral: Positive for dysphoric mood and decreased concentration. Negative for suicidal ideas, behavioral problems, confusion, sleep disturbance and agitation. The patient is nervous/anxious.        Objective:   Physical Exam  Constitutional: She is oriented to person, place, and time. No distress.  HENT:  Head: Normocephalic and atraumatic.  Mouth/Throat: Oropharynx is clear and moist. No oropharyngeal exudate.  Eyes: Conjunctivae and EOM are normal. Pupils are equal, round, and reactive to light. No scleral icterus.  Neck: Normal range of motion. Neck supple.  Cardiovascular: Normal rate, regular rhythm and normal heart sounds.   Pulmonary/Chest: Effort normal and breath sounds normal. No respiratory distress. She has no wheezes.  Abdominal: She exhibits no distension and no mass.  Musculoskeletal:       Legs: Lymphadenopathy:    She has no cervical adenopathy.  Neurological: She is alert and oriented to person, place, and time. She exhibits normal muscle tone. Coordination normal.  Skin: Skin is warm. She is not diaphoretic. No erythema. No pallor.  Psychiatric: She has a normal mood and affect. Her behavior is normal. Judgment and thought content normal.          Assessment & Plan:  Prosthetic joint infection: Continue to observe off of antibiotics  re-check sedimentation rate C-reactive protein today. I dont like that she is having more pain but it is fairly subtle by hx and worse with standing and bearing weight    MRSA infection: Recommended decolonization prior to any future elective surgeries.

## 2012-11-10 ENCOUNTER — Ambulatory Visit (INDEPENDENT_AMBULATORY_CARE_PROVIDER_SITE_OTHER): Payer: Medicaid Other | Admitting: Physician Assistant

## 2012-11-10 ENCOUNTER — Encounter: Payer: Self-pay | Admitting: Physician Assistant

## 2012-11-10 VITALS — BP 130/74 | HR 82 | Temp 97.7°F | Resp 16 | Ht <= 58 in | Wt 147.0 lb

## 2012-11-10 DIAGNOSIS — E875 Hyperkalemia: Secondary | ICD-10-CM

## 2012-11-10 DIAGNOSIS — E119 Type 2 diabetes mellitus without complications: Secondary | ICD-10-CM

## 2012-11-10 DIAGNOSIS — I10 Essential (primary) hypertension: Secondary | ICD-10-CM

## 2012-11-10 DIAGNOSIS — M810 Age-related osteoporosis without current pathological fracture: Secondary | ICD-10-CM

## 2012-11-10 DIAGNOSIS — E785 Hyperlipidemia, unspecified: Secondary | ICD-10-CM

## 2012-11-10 LAB — BASIC METABOLIC PANEL WITH GFR
BUN: 22 mg/dL (ref 6–23)
CO2: 25 mEq/L (ref 19–32)
Calcium: 10 mg/dL (ref 8.4–10.5)
Chloride: 100 mEq/L (ref 96–112)
Creat: 0.93 mg/dL (ref 0.50–1.10)
Glucose, Bld: 173 mg/dL — ABNORMAL HIGH (ref 70–99)

## 2012-11-10 LAB — LIPID PANEL
Cholesterol: 144 mg/dL (ref 0–200)
LDL Cholesterol: 53 mg/dL (ref 0–99)
Total CHOL/HDL Ratio: 2.4 Ratio
VLDL: 30 mg/dL (ref 0–40)

## 2012-11-10 LAB — HEPATIC FUNCTION PANEL
ALT: 48 U/L — ABNORMAL HIGH (ref 0–35)
Bilirubin, Direct: 0.1 mg/dL (ref 0.0–0.3)
Indirect Bilirubin: 0.5 mg/dL (ref 0.0–0.9)

## 2012-11-10 LAB — HEMOGLOBIN A1C
Hgb A1c MFr Bld: 8.4 % — ABNORMAL HIGH (ref <5.7)
Mean Plasma Glucose: 194 mg/dL — ABNORMAL HIGH (ref <117)

## 2012-11-10 MED ORDER — LISINOPRIL 10 MG PO TABS: 10.0000 mg | ORAL_TABLET | Freq: Every day | ORAL | Status: DC

## 2012-11-10 NOTE — Progress Notes (Signed)
Patient ID: Joyce Gilmore MRN: [id number], DOB: Mar 13, 1934, 78 y.o. Date of Encounter: @DATE @  Chief Complaint:  Chief Complaint  Patient presents with  . Follow-up    HPI: 78 y.o. year old hispanic female  presents with her daughter and a Nurse, learning disability. She reports that she's been feeling good and has no complaints today.   Says that things with her hip finally it healed up with no further complications.  She is taking diabetic medications as directed. No adverse effects. She did not bring in her blood sugar log today but says that they have been running well. She tries to be fairly careful with her diet. She does not do any routine exercise.  At her last office visit with me on 07/26/12 a realize that Pravachol was not on the current medication list. They decided that they must have "gotten lost" during all of her hospitalizations regarding her hip. Today I did verify with them and she states that she did restart the pravastatin at the time of that office visit on 6-14. She is taking this without myalgias or adverse effects.  At the last office visit 07/26/12 also realize that Fosamax was missing from her medicine list. Again they had no idea what happened with this medicine either. Again I felt that it probably got lost during her hospitalizations regarding her hip. This report was restarted at that visit. Patient and daughter state that she is taking this and has been taking it ever since that visit in June.  Past Medical History  Diagnosis Date  . Hypertension   . Depression     hx of   . Diabetes mellitus without complication   . Chronic kidney disease     hx of kidney stones,   . Arthritis   . Hearing loss of both ears   . Tinnitus of both ears   . MRSA infection     Oct 13 - Nov 13  . Ganglion, left ankle and foot   . Osteoporosis      Home Meds: See attached medication section for current medication list. Any medications entered into computer today will not  appear on this note's list. The medications listed below were entered prior to today. Current Outpatient Prescriptions on File Prior to Visit  Medication Sig Dispense Refill  . alendronate (FOSAMAX) 70 MG tablet Take 1 tablet (70 mg total) by mouth every 7 (seven) days. Take with a full glass of water on an empty stomach.  12 tablet  3  . aspirin 325 MG tablet Take 325 mg by mouth daily.      . calcium-vitamin D (OSCAL WITH D) 500-200 MG-UNIT per tablet Take 1 tablet by mouth daily.      . Cholecalciferol (VITAMIN D3) 400 UNITS tablet Take 400 Units by mouth daily.      Marland Kitchen glipiZIDE (GLUCOTROL XL) 5 MG 24 hr tablet Take 1 tablet (5 mg total) by mouth daily.  90 tablet  1  . metFORMIN (GLUCOPHAGE) 1000 MG tablet Take 1 tablet (1,000 mg total) by mouth 2 (two) times daily with a meal.  180 tablet  1  . pravastatin (PRAVACHOL) 40 MG tablet Take 1 tablet (40 mg total) by mouth daily.  90 tablet  1  . sitaGLIPtin (JANUVIA) 100 MG tablet Take 1 tablet (100 mg total) by mouth daily.  90 tablet  1   No current facility-administered medications on file prior to visit.    Allergies:  Allergies  Allergen Reactions  .  Ace Inhibitors     Hyperkalemia  . Bactrim [Sulfamethoxazole W-Trimethoprim]     Hyperkalemia  . Rifampin Other (See Comments)    Severe thrombocytopenia  . Vancomycin Other (See Comments)    Severe thrombocytopenia    History   Social History  . Marital Status: Widowed    Spouse Name: N/A    Number of Children: N/A  . Years of Education: N/A   Occupational History  . Not on file.   Social History Main Topics  . Smoking status: Never Smoker   . Smokeless tobacco: Never Used  . Alcohol Use: No  . Drug Use: No  . Sexual Activity: Not on file   Other Topics Concern  . Not on file   Social History Narrative   ** Merged History Encounter **        Family History  Problem Relation Age of Onset  . Colon cancer Neg Hx   . Hypertension Mother      Review of  Systems:  See HPI for pertinent ROS. All other ROS negative.    Physical Exam: Blood pressure 130/74, pulse 82, temperature 97.7 F (36.5 C), temperature source Oral, resp. rate 16, height 4' 8.5" (1.435 m), weight 147 lb (66.679 kg)., Body mass index is 32.38 kg/(m^2). General: Well-nourished well-developed Hispanic female .Appears in no acute distress. Neck: Supple. No thyromegaly. No lymphadenopathy. No carotid bruit Lungs: Clear bilaterally to auscultation without wheezes, rales, or rhonchi. Breathing is unlabored. Heart: RRR with S1 S2. No murmurs, rubs, or gallops. Abdomen: Soft, non-tender, non-distended with normoactive bowel sounds. No hepatomegaly. No rebound/guarding. No obvious abdominal masses. Musculoskeletal:  Strength and tone normal for age. Extremities/Skin: Warm and dry. No clubbing or cyanosis. No edema. No rashes or suspicious lesions. Neuro: Alert and oriented X 3. Moves all extremities spontaneously. Gait is normal. CNII-XII grossly in tact. Psych:  Responds to questions appropriately with a normal affect. Diabetic foot exam: See attached section.      ASSESSMENT AND PLAN:  78 y.o. year old female with  1. DIABETES MELLITUS, TYPE II - Hemoglobin A1c Her albumin was done 07/26/12  2. HTN (hypertension) During her hospitalization in November 2013 she developed severe hyperkalemia. His Bactrim and lisinopril were stopped and her potassium normalized. She has remained off ACE inhibitor or sense then. However today daughter does mention that at age and other doctors office visit yesterday and her blood pressure was elevated in the 150s over 90s.  Also at last office visit here her blood pressure was 144/80. I reviewed our charts and reviewed the fact that she was on ACE inhibitor with no hyperkalemia until 12/28/2011 at which time she was also on Bactrim. Given that she has diabetes and hypertension I think we should try ACE inhibitor again. We'll start low dose and  monitor carefully for hyperkalemia She will schedule followup office visit in 2 weeks to recheck blood pressure and BMET - lisinopril (PRINIVIL,ZESTRIL) 10 MG tablet; Take 1 tablet (10 mg total) by mouth daily.  Dispense: 90 tablet; Refill: 3  3. Hyperkalemia--see #2 above  4. HYPERLIPIDEMIA - Lipid panel - Hepatic function panel  5. Osteoporosis She did read them if the Fosamax at the last visit in June. Patient and daughter report that the Fosamax was started 4 years ago. I discussed with him at the last visit we will plan to continue this for 5 years and then stop it.  6 mammogram: 03/26/12 negative  7 screening colonoscopy: 06/21/12: Polyp colon based on pathology  recommend repeat 5 years. But generally start screening colonoscopy at age 69. GI no recommended that she discuss this with her PCP at time of repeat at age 6 to see whether to do repeat or not.  #8 immunizations: Pneumovax? Whether she has had this Tetanus: Had 04/2011 Zostavax: she was told to check her her insurance but she has forgotten  9: Status post MRSA infection of prosthetic hip November 2013--patient and daughters report that she has had no further recent complications with this.  Followup office visit in 2 weeks to recheck blood pressure in the meds.   [address] [place], [place], [facility] 11/10/2012 1:39 PM

## 2012-11-12 MED ORDER — PIOGLITAZONE HCL 30 MG PO TABS: 30.0000 mg | ORAL_TABLET | Freq: Every day | ORAL | Status: DC

## 2012-11-12 NOTE — Addendum Note (Signed)
Addended by: Elvina Mattes T on: 11/12/2012 10:52 AM   Modules accepted: Orders

## 2012-11-24 ENCOUNTER — Ambulatory Visit (INDEPENDENT_AMBULATORY_CARE_PROVIDER_SITE_OTHER): Payer: Medicaid Other | Admitting: Physician Assistant

## 2012-11-24 ENCOUNTER — Encounter: Payer: Self-pay | Admitting: Physician Assistant

## 2012-11-24 VITALS — BP 124/70 | HR 60 | Temp 97.7°F | Resp 18 | Wt 149.0 lb

## 2012-11-24 DIAGNOSIS — I10 Essential (primary) hypertension: Secondary | ICD-10-CM

## 2012-11-24 DIAGNOSIS — E119 Type 2 diabetes mellitus without complications: Secondary | ICD-10-CM

## 2012-11-24 DIAGNOSIS — E875 Hyperkalemia: Secondary | ICD-10-CM

## 2012-11-24 DIAGNOSIS — IMO0001 Reserved for inherently not codable concepts without codable children: Secondary | ICD-10-CM

## 2012-11-24 DIAGNOSIS — Z789 Other specified health status: Secondary | ICD-10-CM

## 2012-11-24 LAB — BASIC METABOLIC PANEL WITH GFR
Calcium: 10.1 mg/dL (ref 8.4–10.5)
Chloride: 96 mEq/L (ref 96–112)
Creat: 0.97 mg/dL (ref 0.50–1.10)
GFR, Est Non African American: 57 mL/min — ABNORMAL LOW
Sodium: 125 mEq/L — ABNORMAL LOW (ref 135–145)

## 2012-11-24 NOTE — Progress Notes (Signed)
Patient ID: Joyce Gilmore MRN: [id number], DOB: 03-13-1934, 78 y.o. Date of Encounter: 11/24/2012, 12:01 PM    Chief Complaint:  Chief Complaint  Patient presents with  . follow up visit for BP     HPI: 78 y.o. year old Hispanic  female here to f/u regarding BP medication.   She has a history of hypertension and diabetes since she had been on ACE inhibitor in the past. However during her hospitalization in November 2013 she developed severe hyperkalemia. At that time she was also on Bactrim for her infection. As the Bactrim and the ACE inhibitor was stopped. Her potassium went back down to normal. We had kept her off of ACE inhibitor since then. However at her last office visit on 11/10/12 we discussed that her blood pressure had been elevated and some visits here as well as at some of her visits with her specialist. We also discussed that in the past she had been on ACE inhibitor for a long time with no hyperkalemia. She had no hyperkalemia and so she was also on the Bactrim. Therefore we decided to add back low-dose ACE inhibitor and have close monitoring. lisinopril 10 mg daily was added to her medicines at her last office visit here on 11/10/12. She has been taking this daily. No adverse effects. Is here to recheck blood pressure and lab work on medication.     Home Meds: See attached medication section for any medications that were entered at today's visit. The computer does not put those onto this list.The following list is a list of meds entered prior to today's visit.   Current Outpatient Prescriptions on File Prior to Visit  Medication Sig Dispense Refill  . alendronate (FOSAMAX) 70 MG tablet Take 1 tablet (70 mg total) by mouth every 7 (seven) days. Take with a full glass of water on an empty stomach.  12 tablet  3  . aspirin 325 MG tablet Take 325 mg by mouth daily.      . calcium-vitamin D (OSCAL WITH D) 500-200 MG-UNIT per tablet Take 1 tablet by mouth daily.       . Cholecalciferol (VITAMIN D3) 400 UNITS tablet Take 400 Units by mouth daily.      Marland Kitchen glipiZIDE (GLUCOTROL XL) 5 MG 24 hr tablet Take 1 tablet (5 mg total) by mouth daily.  90 tablet  1  . lisinopril (PRINIVIL,ZESTRIL) 10 MG tablet Take 1 tablet (10 mg total) by mouth daily.  90 tablet  3  . metFORMIN (GLUCOPHAGE) 1000 MG tablet Take 1 tablet (1,000 mg total) by mouth 2 (two) times daily with a meal.  180 tablet  1  . pioglitazone (ACTOS) 30 MG tablet Take 1 tablet (30 mg total) by mouth daily.  30 tablet  3  . pravastatin (PRAVACHOL) 40 MG tablet Take 1 tablet (40 mg total) by mouth daily.  90 tablet  1  . sitaGLIPtin (JANUVIA) 100 MG tablet Take 1 tablet (100 mg total) by mouth daily.  90 tablet  1   No current facility-administered medications on file prior to visit.    Allergies:  Allergies  Allergen Reactions  . Ace Inhibitors     Hyperkalemia  . Bactrim [Sulfamethoxazole-Trimethoprim]     Hyperkalemia  . Rifampin Other (See Comments)    Severe thrombocytopenia  . Vancomycin Other (See Comments)    Severe thrombocytopenia      Review of Systems: See HPI for pertinent ROS. All other ROS negative.    Physical  Exam: Blood pressure 124/70, pulse 60, temperature 97.7 F (36.5 C), temperature source Oral, resp. rate 18, weight 149 lb (67.586 kg)., Body mass index is 32.82 kg/(m^2). General: Well-nourished well-developed Hispanic female.  Appears in no acute distress. Neck: Supple. No thyromegaly. No lymphadenopathy. No carotid bruit. Lungs: Clear bilaterally to auscultation without wheezes, rales, or rhonchi. Breathing is unlabored. Heart: Regular rhythm. No murmurs, rubs, or gallops. Msk:  Strength and tone normal for age. Extremities/Skin: Warm and dry. No edema.  Neuro: Alert and oriented X 3. Moves all extremities spontaneously. Gait is normal. CNII-XII grossly in tact. Psych:  Responds to questions appropriately with a normal affect.     ASSESSMENT AND PLAN:  78 y.o.  year old female with  1. Hyperkalemia History of severe hyperkalemia during hospitalization November 2013. At that time she was on both Bactrim and ACE inhibitors to both of these medications were stopped. See history of present illness for further details regarding this. Recheck lab now to monitor. - BASIC METABOLIC PANEL WITH GFR  2. HYPERTENSION Blood pressure is now at goal. - BASIC METABOLIC PANEL WITH GFR  3. DIABETES MELLITUS, TYPE II Ideally needs to be on ACE inhibitor therapy given her diabetes. - BASIC METABOLIC PANEL WITH GFR  For full details of her history see my last office note on 11/10/12 which was for a complete routine visit. Today she is simply here to recheck blood pressure and lab on ACE inhibitor.  [address] [place], [place], [facility] 11/24/2012 12:01 PM

## 2012-12-03 ENCOUNTER — Telehealth: Payer: Self-pay | Admitting: Physician Assistant

## 2012-12-03 NOTE — Telephone Encounter (Signed)
Please call with labn results

## 2012-12-04 NOTE — Addendum Note (Signed)
Addended by: Elvina Mattes T on: 12/04/2012 10:50 AM   Modules accepted: Orders

## 2012-12-06 NOTE — Telephone Encounter (Signed)
Spoke to family friend who speaks [place].  Aware to restrict fluids and need repeat lab work in one week

## 2012-12-10 ENCOUNTER — Other Ambulatory Visit: Payer: Medicaid Other

## 2012-12-10 DIAGNOSIS — IMO0001 Reserved for inherently not codable concepts without codable children: Secondary | ICD-10-CM

## 2012-12-10 LAB — BASIC METABOLIC PANEL
BUN: 21 mg/dL (ref 6–23)
Calcium: 9.8 mg/dL (ref 8.4–10.5)
Chloride: 104 mEq/L (ref 96–112)
Glucose, Bld: 119 mg/dL — ABNORMAL HIGH (ref 70–99)
Potassium: 4.9 mEq/L (ref 3.5–5.3)
Sodium: 134 mEq/L — ABNORMAL LOW (ref 135–145)

## 2013-01-25 ENCOUNTER — Other Ambulatory Visit: Payer: Self-pay | Admitting: Physician Assistant

## 2013-01-29 ENCOUNTER — Other Ambulatory Visit: Payer: Self-pay | Admitting: Physician Assistant

## 2013-01-31 NOTE — Telephone Encounter (Signed)
Medication refilled per protocol. 

## 2013-02-08 ENCOUNTER — Ambulatory Visit: Payer: Self-pay | Admitting: Infectious Disease

## 2013-02-28 ENCOUNTER — Ambulatory Visit: Payer: Medicaid Other | Admitting: Physician Assistant

## 2013-04-13 ENCOUNTER — Telehealth: Payer: Self-pay | Admitting: *Deleted

## 2013-04-13 NOTE — Telephone Encounter (Signed)
Received VM. Returned call. Requested to schedule appointment for check-up and medication refill. Appointment scheduled for 04/14/2013 @ 9:30am.

## 2013-04-14 ENCOUNTER — Encounter: Payer: Self-pay | Admitting: Physician Assistant

## 2013-04-14 ENCOUNTER — Ambulatory Visit (INDEPENDENT_AMBULATORY_CARE_PROVIDER_SITE_OTHER): Payer: Medicaid Other | Admitting: Physician Assistant

## 2013-04-14 VITALS — BP 144/80 | HR 68 | Temp 98.1°F | Resp 18 | Ht <= 58 in | Wt 146.0 lb

## 2013-04-14 DIAGNOSIS — E871 Hypo-osmolality and hyponatremia: Secondary | ICD-10-CM

## 2013-04-14 DIAGNOSIS — K5730 Diverticulosis of large intestine without perforation or abscess without bleeding: Secondary | ICD-10-CM

## 2013-04-14 DIAGNOSIS — E785 Hyperlipidemia, unspecified: Secondary | ICD-10-CM

## 2013-04-14 DIAGNOSIS — M810 Age-related osteoporosis without current pathological fracture: Secondary | ICD-10-CM

## 2013-04-14 DIAGNOSIS — M778 Other enthesopathies, not elsewhere classified: Secondary | ICD-10-CM

## 2013-04-14 DIAGNOSIS — M7581 Other shoulder lesions, right shoulder: Secondary | ICD-10-CM

## 2013-04-14 DIAGNOSIS — I10 Essential (primary) hypertension: Secondary | ICD-10-CM

## 2013-04-14 DIAGNOSIS — E119 Type 2 diabetes mellitus without complications: Secondary | ICD-10-CM

## 2013-04-14 DIAGNOSIS — M719 Bursopathy, unspecified: Secondary | ICD-10-CM

## 2013-04-14 DIAGNOSIS — E875 Hyperkalemia: Secondary | ICD-10-CM

## 2013-04-14 DIAGNOSIS — M67919 Unspecified disorder of synovium and tendon, unspecified shoulder: Secondary | ICD-10-CM

## 2013-04-14 LAB — COMPLETE METABOLIC PANEL WITH GFR
ALT: 38 U/L — ABNORMAL HIGH (ref 0–35)
AST: 43 U/L — AB (ref 0–37)
Albumin: 4.6 g/dL (ref 3.5–5.2)
Alkaline Phosphatase: 64 U/L (ref 39–117)
BUN: 19 mg/dL (ref 6–23)
CALCIUM: 9.7 mg/dL (ref 8.4–10.5)
CO2: 22 mEq/L (ref 19–32)
CREATININE: 0.95 mg/dL (ref 0.50–1.10)
Chloride: 101 mEq/L (ref 96–112)
GFR, EST NON AFRICAN AMERICAN: 58 mL/min — AB
GFR, Est African American: 67 mL/min
Glucose, Bld: 156 mg/dL — ABNORMAL HIGH (ref 70–99)
Potassium: 5.1 mEq/L (ref 3.5–5.3)
Sodium: 134 mEq/L — ABNORMAL LOW (ref 135–145)
Total Bilirubin: 0.4 mg/dL (ref 0.2–1.2)
Total Protein: 7.7 g/dL (ref 6.0–8.3)

## 2013-04-14 LAB — LIPID PANEL
Cholesterol: 130 mg/dL (ref 0–200)
HDL: 58 mg/dL (ref >39)
LDL Cholesterol: 50 mg/dL (ref 0–99)
Total CHOL/HDL Ratio: 2.2 Ratio
Triglycerides: 112 mg/dL (ref <150)
VLDL: 22 mg/dL (ref 0–40)

## 2013-04-14 LAB — HEMOGLOBIN A1C
HEMOGLOBIN A1C: 8.3 % — AB (ref <5.7)
MEAN PLASMA GLUCOSE: 192 mg/dL — AB (ref <117)

## 2013-04-14 NOTE — Progress Notes (Signed)
Patient ID: Joyce Gilmore MRN: [id number], DOB: 1934/07/09, 79 y.o. Date of Encounter: @DATE @  Chief Complaint:  Chief Complaint  Patient presents with  . 3 month check up    is fasting, ran out of meds while out of town BS went up, saw doctor in [facility]  . pain in upper rt back and shoulder    HPI: 79 y.o. year old female  presents with her son for office visit today. She speaks no [place] but he translates for her. He reports that she visited [place] for 65 days. During the last week they are her medication ran out. Blood sugar went very high she admitted to the emergency room and may gave her enough medication to last the remainder of the trip.  Today she also has complaints of pain in her right shoulder area. Says that she started noticing it about a year again no but recently has been bothering her more. She often wakes up at night secondary to the discomfort while she is sleeping.  No other complaints today. She is taking all medications as directed other than the above. She did not bring in a blood sugar log today. However she says that her blood sugars have been running well. She shows to be careful with her diet. She does no routine exercise. She is taking her pravastatin with no adverse effects and no myalgias.    Past Medical History  Diagnosis Date  . Hypertension   . Depression     hx of   . Diabetes mellitus without complication   . Chronic kidney disease     hx of kidney stones,   . Arthritis   . Hearing loss of both ears   . Tinnitus of both ears   . MRSA infection     Oct 13 - Nov 13  . Ganglion, left ankle and foot   . Osteoporosis      Home Meds: See attached medication section for current medication list. Any medications entered into computer today will not appear on this note's list. The medications listed below were entered prior to today. Current Outpatient Prescriptions on File Prior to Visit  Medication Sig Dispense Refill  .  ACCU-CHEK AVIVA PLUS test strip USE TO TEST BLOOD SUGAR THREE TIMES DAILY  100 each  5  . alendronate (FOSAMAX) 70 MG tablet Take 1 tablet (70 mg total) by mouth every 7 (seven) days. Take with a full glass of water on an empty stomach.  12 tablet  3  . calcium-vitamin D (OSCAL WITH D) 500-200 MG-UNIT per tablet Take 1 tablet by mouth daily.      . Cholecalciferol (VITAMIN D3) 400 UNITS tablet Take 400 Units by mouth daily.      Marland Kitchen glipiZIDE (GLUCOTROL XL) 5 MG 24 hr tablet TAKE 1 TABLET BY MOUTH DAILY  90 tablet  1  . lisinopril (PRINIVIL,ZESTRIL) 10 MG tablet Take 1 tablet (10 mg total) by mouth daily.  90 tablet  3  . metFORMIN (GLUCOPHAGE) 1000 MG tablet Take 1 tablet (1,000 mg total) by mouth 2 (two) times daily with a meal.  180 tablet  1  . pioglitazone (ACTOS) 30 MG tablet Take 1 tablet (30 mg total) by mouth daily.  30 tablet  3  . pravastatin (PRAVACHOL) 40 MG tablet TAKE 1 TABLET BY MOUTH DAILY  90 tablet  1  . sitaGLIPtin (JANUVIA) 100 MG tablet Take 1 tablet (100 mg total) by mouth daily.  90 tablet  1  . aspirin 325 MG tablet Take 325 mg by mouth daily.       No current facility-administered medications on file prior to visit.    Allergies:  Allergies  Allergen Reactions  . Ace Inhibitors     Hyperkalemia  . Bactrim [Sulfamethoxazole-Trimethoprim]     Hyperkalemia  . Rifampin Other (See Comments)    Severe thrombocytopenia  . Vancomycin Other (See Comments)    Severe thrombocytopenia    History   Social History  . Marital Status: Widowed    Spouse Name: N/A    Number of Children: N/A  . Years of Education: N/A   Occupational History  . Not on file.   Social History Main Topics  . Smoking status: Never Smoker   . Smokeless tobacco: Never Used  . Alcohol Use: No  . Drug Use: No  . Sexual Activity: Not on file   Other Topics Concern  . Not on file   Social History Narrative   ** Merged History Encounter **        Family History  Problem Relation Age of  Onset  . Colon cancer Neg Hx   . Hypertension Mother      Review of Systems:  See HPI for pertinent ROS. All other ROS negative.    Physical Exam: Blood pressure 144/80, pulse 68, temperature 98.1 F (36.7 C), temperature source Oral, resp. rate 18, height 4' 8.5" (1.435 m), weight 146 lb (66.225 kg)., Body mass index is 32.16 kg/(m^2). General: WNWD Hispanic Female. Appears in no acute distress. Neck: Supple. No thyromegaly. No lymphadenopathy. No carotid bruit. Lungs: Clear bilaterally to auscultation without wheezes, rales, or rhonchi. Breathing is unlabored. Heart: RRR with S1 S2. No murmurs, rubs, or gallops. Abdomen: Soft, non-tender, non-distended with normoactive bowel sounds. No hepatomegaly. No rebound/guarding. No obvious abdominal masses. Musculoskeletal:  Strength and tone normal for age. Extremities/Skin: Warm and dry. No clubbing or cyanosis. No edema. No rashes or suspicious lesions. Neuro: Alert and oriented X 3. Moves all extremities spontaneously. Gait is normal. CNII-XII grossly in tact. Psych:  Responds to questions appropriately with a normal affect. Diabetic foot exam: Inspection is normal. Sensation is intact. She has 2+ bilateral posterior tibial pulses. No palpable dorsalis pedis pulse bilaterally.      ASSESSMENT AND PLAN:  79 y.o. year old female with  1. DIABETES MELLITUS, TYPE II - COMPLETE METABOLIC PANEL WITH GFR - Hemoglobin A1c  Microalbumin was done 07/26/12  At last lab 11/10/12 A1c was elevated at 8.4. At that time recommended that she add Actos 30 mg daily. She is now taking the Actos 30 mg.  2. HYPERTENSION - COMPLETE METABOLIC PANEL WITH GFR  During hospitalization November 2013 she developed severe hyperkalemia. Bactrim and lisinopril were stopped and her potassium normalized. She had remained off ACE inhibitor since then up until her appointment with me on 11/10/12. At that time I reviewed her prior chart and the fact that she been on ACE  inhibitor with no hypertrophy anemia until 12/28/11 at which time she was also on Bactrim. Given that she has diabetes and hypertension I felt that we should try ACE inhibitor again. Started a low dose and planned close followup. Followup labs were normal with no hyperkalemia. She actually had followup lab on 10/ 1 as well as 10/17 and at both times  potassiums were nml at  4.4 and 4.9.  3. Hyponatremia - COMPLETE METABOLIC PANEL WITH GFR At lab 11/24/12 sodium was low 125. We placed  her on fluid restriction and followup lab on 12/10/2012 showed sodium 134.  4. Hyperkalemia See #2 above - COMPLETE METABOLIC PANEL WITH GFR  5. HYPERLIPIDEMIA - COMPLETE METABOLIC PANEL WITH GFR - Lipid panel  6. Osteoporosis On Fosamax. Fosamax was started in the year 2000 according to the patient and daughter (at prior OV). We discussed we would plan to continue it for 5 years and then stop it. AT HER NEXT OV WILL STOP THIS MEDIACTION, AS SHE WILL HAVE COMPLETED THE 5 YEARS. - COMPLETE METABOLIC PANEL WITH GFR  7. DIVERTICULOSIS, COLON  8. Right shoulder tendinitis I provided a handout which I reviewed with her son who understands [place] and he can explain this to her in [place]. As discussed his posture changes when he is sitting at rest as well as position changes when doing tasks. It also has multiple stretches and exercises that she can do at home and she is to do these 3 times a day.  9. mammogram: 03/26/12 negative. I'm not sure she's had followup yet this year. We'll discuss again at the next visit.  10. Weaning colonoscopy: 06/21/12: Polyp. Based on pathology recommend repeat 5 years. But generally recommended to stop screening colonoscopies at age 44. GI recommended that she discuss this with her PCP at the time of the repeat at age 56 to see whether to do repeat or not. We will discuss it at that time.  11. Immunizations: I am not sure whether she has had pneumonia vaccine or not. WILL PLAN TO  GIVE THIS AT NEXT OV Tetanus: Had a 04/2011 Fosamax: She was told to check with her insurance but she has forgotten  Regular office visit 3 months or sooner if needed.   [address] [place], [place], [facility] 04/14/2013 10:32 AM

## 2013-04-16 ENCOUNTER — Other Ambulatory Visit: Payer: Self-pay | Admitting: Physician Assistant

## 2013-04-16 DIAGNOSIS — IMO0001 Reserved for inherently not codable concepts without codable children: Secondary | ICD-10-CM

## 2013-04-16 DIAGNOSIS — E1165 Type 2 diabetes mellitus with hyperglycemia: Principal | ICD-10-CM

## 2013-04-18 ENCOUNTER — Ambulatory Visit (INDEPENDENT_AMBULATORY_CARE_PROVIDER_SITE_OTHER): Payer: Medicaid Other | Admitting: Physician Assistant

## 2013-04-18 ENCOUNTER — Encounter: Payer: Self-pay | Admitting: Physician Assistant

## 2013-04-18 ENCOUNTER — Other Ambulatory Visit: Payer: Self-pay | Admitting: Family Medicine

## 2013-04-18 VITALS — BP 144/72 | HR 60 | Resp 18 | Wt 149.0 lb

## 2013-04-18 DIAGNOSIS — IMO0001 Reserved for inherently not codable concepts without codable children: Secondary | ICD-10-CM

## 2013-04-18 DIAGNOSIS — E119 Type 2 diabetes mellitus without complications: Secondary | ICD-10-CM

## 2013-04-18 DIAGNOSIS — E1165 Type 2 diabetes mellitus with hyperglycemia: Principal | ICD-10-CM

## 2013-04-18 MED ORDER — INSULIN GLARGINE 100 UNITS/ML SOLOSTAR PEN: PEN_INJECTOR | SUBCUTANEOUS | Status: DC

## 2013-04-18 MED ORDER — "PEN NEEDLES 5/16"" 31G X 8 MM MISC": 1.0000 | Freq: Every day | Status: DC

## 2013-04-18 MED ORDER — INSULIN GLARGINE 100 UNITS/ML SOLOSTAR PEN: 10.0000 [IU] | PEN_INJECTOR | Freq: Every day | SUBCUTANEOUS | Status: DC

## 2013-04-18 NOTE — Telephone Encounter (Signed)
Medication refilled per protocol. 

## 2013-04-18 NOTE — Progress Notes (Signed)
Patient ID: Joyce Gilmore MRN: [id number], DOB: 1934-07-16, 79 y.o. Date of Encounter: @DATE @  Chief Complaint:  Chief Complaint  Patient presents with  . here to discuss insulin    HPI: 79 y.o. year old Hispanic female  presents with a [employer] employeed interpreter, patient's son, his daughter (patients grand-daughter).   Patient was just here last week for a regular office visit. At that time her A1c came back elevated. Recommended that she schedule office visit to come in to discuss medication treatment options. She is already on multiple oral agents. Discussed with them that even if we added another oral agent but she still would not be at goal. Discussed that we are running out of options of oral agents. Discussed that further medications would be branded inexpensive. And even if we added these she still probably would not be to goal. I recommend that we stop all oral agents except for metformin and  add Lantus. We can then titrate the Lantus dose to get her to goal.  They are agreeable with this approach.   Past Medical History  Diagnosis Date  . Hypertension   . Depression     hx of   . Diabetes mellitus without complication   . Chronic kidney disease     hx of kidney stones,   . Arthritis   . Hearing loss of both ears   . Tinnitus of both ears   . MRSA infection     Oct 13 - Nov 13  . Ganglion, left ankle and foot   . Osteoporosis      Home Meds: See attached medication section for current medication list. Any medications entered into computer today will not appear on this note's list. The medications listed below were entered prior to today. Current Outpatient Prescriptions on File Prior to Visit  Medication Sig Dispense Refill  . ACCU-CHEK AVIVA PLUS test strip USE TO TEST BLOOD SUGAR THREE TIMES DAILY  100 each  5  . alendronate (FOSAMAX) 70 MG tablet Take 1 tablet (70 mg total) by mouth every 7 (seven) days. Take with a full glass of water on an empty  stomach.  12 tablet  3  . aspirin 325 MG tablet Take 325 mg by mouth daily.      . calcium-vitamin D (OSCAL WITH D) 500-200 MG-UNIT per tablet Take 1 tablet by mouth daily.      . Cholecalciferol (VITAMIN D3) 400 UNITS tablet Take 400 Units by mouth daily.      Marland Kitchen lisinopril (PRINIVIL,ZESTRIL) 10 MG tablet Take 1 tablet (10 mg total) by mouth daily.  90 tablet  3  . metFORMIN (GLUCOPHAGE) 1000 MG tablet Take 1 tablet (1,000 mg total) by mouth 2 (two) times daily with a meal.  180 tablet  1  . pravastatin (PRAVACHOL) 40 MG tablet TAKE 1 TABLET BY MOUTH DAILY  90 tablet  1   No current facility-administered medications on file prior to visit.    Allergies:  Allergies  Allergen Reactions  . Ace Inhibitors     Hyperkalemia  . Bactrim [Sulfamethoxazole-Trimethoprim]     Hyperkalemia  . Rifampin Other (See Comments)    Severe thrombocytopenia  . Vancomycin Other (See Comments)    Severe thrombocytopenia    History   Social History  . Marital Status: Widowed    Spouse Name: N/A    Number of Children: N/A  . Years of Education: N/A   Occupational History  . Not on file.  Social History Main Topics  . Smoking status: Never Smoker   . Smokeless tobacco: Never Used  . Alcohol Use: No  . Drug Use: No  . Sexual Activity: Not on file   Other Topics Concern  . Not on file   Social History Narrative   ** Merged History Encounter **        Family History  Problem Relation Age of Onset  . Colon cancer Neg Hx   . Hypertension Mother      Review of Systems:  See HPI for pertinent ROS. All other ROS negative.    Physical Exam: Blood pressure 144/72, pulse 60, resp. rate 18, weight 149 lb (67.586 kg)., Body mass index is 32.82 kg/(m^2). General: WNWD Hispanic Female. Appears in no acute distress. Neck: Supple. No thyromegaly. No lymphadenopathy. Lungs: Clear bilaterally to auscultation without wheezes, rales, or rhonchi. Breathing is unlabored. Heart: RRR with S1 S2. No  murmurs, rubs, or gallops. Musculoskeletal:  Strength and tone normal for age. Extremities/Skin: Warm and dry. Neuro: Alert and oriented X 3. Moves all extremities spontaneously. Gait is normal. CNII-XII grossly in tact. Psych:  Responds to questions appropriately with a normal affect.     ASSESSMENT AND PLAN:  79 y.o. year old female with  1. DIABETES MELLITUS, TYPE II  > 30 minutes was spent in direct face-to-face contact with the patient the translator and her family members. Wrote out  on a piece of paper which medications to stop which are: Glucotrol Januvia Actos  I gave and reviewed blood sugar log sheet. Wrote the month of February along the top of one log. Wrote the month of March along the top of the next log. Told him to use the numbers along the left age as the date. She will start with 10 units at night.  Then will increase by 1 unit each night  Is to check fasting blood sugar every morning and document on the log sheet. Bring lg sheet to the next appointment. Schedule followup office visit with me in one week. Call soonerf any questions or concerns  I personally went out and discussed all of the above information. The LPN working with me then went in the room and demonstrated how to use the Lantus pen and answered all further questions. W gave h 2 sample Lantus pen as well as sed a prescription to the pharmacy.  - insulin glargine (LANTUS) 100 units/mL SOLN; As Directed--Up to 40 units at night  Dispense: 3 vial; Refill: 3   Signed, [address] [place], [place], [facility] 04/18/2013 10:27 AM

## 2013-04-25 ENCOUNTER — Ambulatory Visit (INDEPENDENT_AMBULATORY_CARE_PROVIDER_SITE_OTHER): Payer: Medicaid Other | Admitting: Physician Assistant

## 2013-04-25 ENCOUNTER — Encounter: Payer: Self-pay | Admitting: Infectious Disease

## 2013-04-25 ENCOUNTER — Ambulatory Visit (INDEPENDENT_AMBULATORY_CARE_PROVIDER_SITE_OTHER): Payer: Medicaid Other | Admitting: Infectious Disease

## 2013-04-25 ENCOUNTER — Encounter: Payer: Self-pay | Admitting: Physician Assistant

## 2013-04-25 VITALS — BP 122/68 | HR 68 | Resp 18 | Wt 148.0 lb

## 2013-04-25 VITALS — BP 154/78 | Temp 97.8°F | Ht <= 58 in | Wt 147.0 lb

## 2013-04-25 DIAGNOSIS — A4902 Methicillin resistant Staphylococcus aureus infection, unspecified site: Secondary | ICD-10-CM

## 2013-04-25 DIAGNOSIS — E119 Type 2 diabetes mellitus without complications: Secondary | ICD-10-CM

## 2013-04-25 DIAGNOSIS — T8450XA Infection and inflammatory reaction due to unspecified internal joint prosthesis, initial encounter: Secondary | ICD-10-CM

## 2013-04-25 NOTE — Progress Notes (Signed)
  Subjective:    Patient ID: Joyce Gilmore, female    DOB: Feb 10, 1935, 79 y.o.   MRN: [id number]  HPI   79 yo Hispanic lady with MRSA prostheic hip infection who had been changed to bactrim with plans to complete additional 2 months of therapy orally when she developed problems with hyperkalemia and renal insufficiency for which she was hospitalized.  I changed her to oral Zyvox 600 mg twice daily and  Continued on this until  end of last January 2014. By this time she had developed thrombocytopenia with a platelet count 143. I stopped  Zyvox placed her back on lower dose Bactrim one double check tablet once daily which she took until she saw me in mid February. Since then her inflammatory markers have normalized we took her off her antibiotics She presents today for followup in our clinic after having been last seen by me in the fall of 2014.  She has only minimal pain with weight bearing but not at rest. No fevers, chills or malaise. Entire interview conducted with Spanish translator in person.    Review of Systems  Constitutional: Negative for fever, chills, diaphoresis, activity change, appetite change, fatigue and unexpected weight change.  HENT: Negative for congestion, rhinorrhea, sinus pressure, sneezing, sore throat and trouble swallowing.   Eyes: Negative for photophobia and visual disturbance.  Respiratory: Negative for cough, chest tightness, shortness of breath, wheezing and stridor.   Cardiovascular: Negative for chest pain, palpitations and leg swelling.  Gastrointestinal: Negative for nausea, vomiting, abdominal pain, diarrhea, constipation, blood in stool, abdominal distention and anal bleeding.  Genitourinary: Negative for dysuria, hematuria, flank pain and difficulty urinating.  Musculoskeletal: Positive for arthralgias. Negative for back pain, gait problem, joint swelling and myalgias.  Skin: Negative for color change, pallor, rash and wound.  Neurological:  Negative for dizziness, tremors, weakness and light-headedness.  Hematological: Negative for adenopathy. Does not bruise/bleed easily.  Psychiatric/Behavioral: Negative for suicidal ideas, behavioral problems, confusion, sleep disturbance and agitation.       Objective:   Physical Exam  Constitutional: She is oriented to person, place, and time. No distress.  HENT:  Head: Normocephalic and atraumatic.  Mouth/Throat: Oropharynx is clear and moist. No oropharyngeal exudate.  Eyes: Conjunctivae and EOM are normal. Pupils are equal, round, and reactive to light. No scleral icterus.  Neck: Normal range of motion. Neck supple.  Cardiovascular: Normal rate, regular rhythm and normal heart sounds.   Pulmonary/Chest: Effort normal and breath sounds normal. No respiratory distress. She has no wheezes.  Abdominal: She exhibits no distension and no mass.  Musculoskeletal:       Legs: Lymphadenopathy:    She has no cervical adenopathy.  Neurological: She is alert and oriented to person, place, and time. She exhibits normal muscle tone. Coordination normal.  Skin: Skin is warm. She is not diaphoretic. No erythema. No pallor.  Psychiatric: She has a normal mood and affect. Her behavior is normal. Judgment and thought content normal.    Well healed surgical scar:          Assessment & Plan:  Prosthetic joint infection: Continue to observe off of antibiotics  retrurn to clinic in one year   MRSA infection: Recommended decolonization prior to any future elective surgeries.

## 2013-04-25 NOTE — Progress Notes (Signed)
Patient ID: Joyce Gilmore MRN: [id number], DOB: [birth year], 79 y.o. Date of Encounter: 04/25/2013, 8:35 PM    Chief Complaint:  Chief Complaint  Patient presents with  . 1 week follow up    after starting insulin,  denies any problems, daughter has been giving her the injection,  states one day had some generalized itching     HPI: 79 y.o. year old Hispanic female is here with her granddaughter who was here with her for her last visit just one week ago. Also today an interpreter through [facility] is here as well. Patient and granddaughter verify that she did stop the 3 pills that I told her to stop at the last visit. They report that she has been taking the Lantus as directed. She reports that she is having no difficulty doing the Lantus injections . Has no questions or concerns today.  They did bring in blood sugar log sheet that I gave her at the last visit. She started the Lantus at 10 units and has increased by 1 unit each dose since it is now at 15 units. She has been documenting fasting blood sugars every morning. They range 135-172.  However, the readings are kind of up and down.  They are not consistently trending downward.  Today's reading was 154.   Over time, at her regular visits, I have seen patient with multiple different family members. Today I did verify with them who she actually lives with. She lives with one of her daughters.  Home Meds: See attached medication section for any medications that were entered at today's visit. The computer does not put those onto this list.The following list is a list of meds entered prior to today's visit.   Current Outpatient Prescriptions on File Prior to Visit  Medication Sig Dispense Refill  . ACCU-CHEK AVIVA PLUS test strip USE TO TEST BLOOD SUGAR THREE TIMES DAILY  100 each  5  . alendronate (FOSAMAX) 70 MG tablet Take 1 tablet (70 mg total) by mouth every 7 (seven) days. Take with a full glass of water on an empty  stomach.  12 tablet  3  . aspirin 325 MG tablet Take 325 mg by mouth daily.      . calcium-vitamin D (OSCAL WITH D) 500-200 MG-UNIT per tablet Take 1 tablet by mouth daily.      . Cholecalciferol (VITAMIN D3) 400 UNITS tablet Take 400 Units by mouth daily.      . insulin glargine (LANTUS) 100 units/mL SOLN Inject 10 Units into the skin at bedtime. As directed, up to 40 units at bedtime  15 mL  1  . Insulin Pen Needle (PEN NEEDLES 31GX5/16") 31G X 8 MM MISC 1 each by Does not apply route daily.  30 each  11  . lisinopril (PRINIVIL,ZESTRIL) 10 MG tablet Take 1 tablet (10 mg total) by mouth daily.  90 tablet  3  . metFORMIN (GLUCOPHAGE) 1000 MG tablet TAKE 1 TABLET BY MOUTH TWICE DAILY  60 tablet  2  . pravastatin (PRAVACHOL) 40 MG tablet TAKE 1 TABLET BY MOUTH DAILY  90 tablet  1   No current facility-administered medications on file prior to visit.    Allergies:  Allergies  Allergen Reactions  . Ace Inhibitors     Hyperkalemia  . Bactrim [Sulfamethoxazole-Trimethoprim]     Hyperkalemia  . Rifampin Other (See Comments)    Severe thrombocytopenia  . Vancomycin Other (See Comments)    Severe thrombocytopenia  Review of Systems: See HPI for pertinent ROS. All other ROS negative.    Physical Exam: Blood pressure 122/68, pulse 68, resp. rate 18, weight 148 lb (67.132 kg)., Body mass index is 32.6 kg/(m^2). General: WNWD Hispanic Female. Appears in no acute distress. Neck: Supple. No thyromegaly. No lymphadenopathy. Lungs: Clear bilaterally to auscultation without wheezes, rales, or rhonchi. Breathing is unlabored. Heart: Regular rhythm. No murmurs, rubs, or gallops. Msk:  Strength and tone normal for age. Extremities/Skin: Warm and dry. Neuro: Alert and oriented X 3. Moves all extremities spontaneously. Gait is normal. CNII-XII grossly in tact. Psych:  Responds to questions appropriately with a normal affect.     ASSESSMENT AND PLAN:  79 y.o. year old female with  1.  DIABETES MELLITUS, TYPE II Continue increasing the Lantus by one unit each day until fasting blood sugars approach the goal which is between 80-120. Once her fasting blood sugars get close to 120, she will stay at that amount of Lantus. She will continue to check fasting morning blood sugars daily. Bring log sheet with her to next appointment. Today I did provide another handout regarding low carbohydrate diet as this granddaughter was having questions about that. Will plan for her to followup in 3 months if things are going well. If she has any confusion regarding her dosing or any questions or concerns then  followup at any time sooner.   [address] [place], [place], [facility] 04/25/2013 8:35 PM

## 2013-05-10 ENCOUNTER — Other Ambulatory Visit: Payer: Self-pay | Admitting: Physician Assistant

## 2013-05-10 DIAGNOSIS — Z1231 Encounter for screening mammogram for malignant neoplasm of breast: Secondary | ICD-10-CM

## 2013-05-20 ENCOUNTER — Ambulatory Visit (HOSPITAL_COMMUNITY)
Admission: RE | Admit: 2013-05-20 | Discharge: 2013-05-20 | Disposition: A | Discharge status: Home / Self Care | Payer: Medicaid Other | Source: Ambulatory Visit | Attending: Physician Assistant | Admitting: Physician Assistant

## 2013-05-20 DIAGNOSIS — Z1231 Encounter for screening mammogram for malignant neoplasm of breast: Secondary | ICD-10-CM

## 2013-06-23 ENCOUNTER — Ambulatory Visit (INDEPENDENT_AMBULATORY_CARE_PROVIDER_SITE_OTHER): Payer: Medicaid Other | Admitting: Physician Assistant

## 2013-06-23 ENCOUNTER — Encounter: Payer: Self-pay | Admitting: Physician Assistant

## 2013-06-23 VITALS — BP 132/60 | HR 76 | Temp 98.4°F | Resp 18 | Wt 152.0 lb

## 2013-06-23 DIAGNOSIS — M542 Cervicalgia: Secondary | ICD-10-CM

## 2013-06-23 DIAGNOSIS — E119 Type 2 diabetes mellitus without complications: Secondary | ICD-10-CM

## 2013-06-23 NOTE — Progress Notes (Signed)
Patient ID: Hanan Mcwilliams MRN: [id number], DOB: [birth year], 79 y.o. Date of Encounter: @DATE @  Chief Complaint:  Chief Complaint  Patient presents with  . c/o elevated blood sugars    feeling weak and loss of appetitie, rt arm is hurting    HPI: 79 y.o. year old female  presents with her  GrandDaughter for office visit today. She  translates for her.  She brought in her blood sugar log sheet. [place] daughter reports that patient is currently using 20 units each bedtime. Checking fasting sugars every morning and occasionally checks at other times during the day.  Patient was concerned because reading this morning was 237.  However just on April 26 (JUST 4 DAYS AGO) fasting reading was 103. Most of the readings have been around 130 and most readings have been good. However granddaughter states the patient was concerned of why the reading this morning was up to 237.  She has been on no oral prednisone. Has received prednisone injections.  Asked what time she ate dinner last night and what  she had for dinner. Says that she ate at about 7:00 PM and did eat spaghetti. Had no bread with it but did have something that is similar to a hard  taco shell, but flattened out.  Says that she had no dessert with that. After that she did have a yogurt. No other snacks last night.  Says that in general she eats somewhere between 5 PM to 7 PM.  Today she is also reporting discomfort in her right neck region. Says she did the exercises that I told her about but is still having some issues. I reviewed  that at her office note with me 04/14/13  she reported to me that she had been having some problems with her right shoulder for about one year but that it had recently been getting worse.  At that time she reported that  It would sometimes wake her up at night.  Gave her a handout that reviews to do with posture changes and position changes as well as some stretches and exercises to do.  She says that she did those daily for 4 weeks. However today she is pointing to more of right neck and trapezius area as the area of her discomfort. Also back around her right shoulder blade.  Granddaughter reports that patient has only been mentioning decreased appetite and some weakness over the past 3-4 days. Says she has had no abdominal pain or other specific symptoms with this. Says the patient was concerned whether there is any correlation between these symptoms and her blood sugar reading higher today.  She is taking her pravastatin with no adverse effects and no myalgias.    Past Medical History  Diagnosis Date  . Hypertension   . Depression     hx of   . Diabetes mellitus without complication   . Chronic kidney disease     hx of kidney stones,   . Arthritis   . Hearing loss of both ears   . Tinnitus of both ears   . MRSA infection     Oct 13 - Nov 13  . Ganglion, left ankle and foot   . Osteoporosis      Home Meds: See attached medication section for current medication list. Any medications entered into computer today will not appear on this note's list. The medications listed below were entered prior to today. Current Outpatient Prescriptions on File Prior to Visit  Medication  Sig Dispense Refill  . ACCU-CHEK AVIVA PLUS test strip USE TO TEST BLOOD SUGAR THREE TIMES DAILY  100 each  5  . alendronate (FOSAMAX) 70 MG tablet Take 1 tablet (70 mg total) by mouth every 7 (seven) days. Take with a full glass of water on an empty stomach.  12 tablet  3  . aspirin 325 MG tablet Take 325 mg by mouth daily.      . calcium-vitamin D (OSCAL WITH D) 500-200 MG-UNIT per tablet Take 1 tablet by mouth daily.      . Cholecalciferol (VITAMIN D3) 400 UNITS tablet Take 400 Units by mouth daily.      . insulin glargine (LANTUS) 100 units/mL SOLN Inject 10 Units into the skin at bedtime. As directed, up to 40 units at bedtime  15 mL  1  . Insulin Pen Needle (PEN NEEDLES 31GX5/16") 31G X 8  MM MISC 1 each by Does not apply route daily.  30 each  11  . lisinopril (PRINIVIL,ZESTRIL) 10 MG tablet Take 1 tablet (10 mg total) by mouth daily.  90 tablet  3  . metFORMIN (GLUCOPHAGE) 1000 MG tablet TAKE 1 TABLET BY MOUTH TWICE DAILY  60 tablet  2  . pravastatin (PRAVACHOL) 40 MG tablet TAKE 1 TABLET BY MOUTH DAILY  90 tablet  1   No current facility-administered medications on file prior to visit.    Allergies:  Allergies  Allergen Reactions  . Ace Inhibitors     Hyperkalemia  . Bactrim [Sulfamethoxazole-Trimethoprim]     Hyperkalemia  . Rifampin Other (See Comments)    Severe thrombocytopenia  . Vancomycin Other (See Comments)    Severe thrombocytopenia    History   Social History  . Marital Status: Widowed    Spouse Name: N/A    Number of Children: N/A  . Years of Education: N/A   Occupational History  . Not on file.   Social History Main Topics  . Smoking status: Never Smoker   . Smokeless tobacco: Never Used  . Alcohol Use: No  . Drug Use: No  . Sexual Activity: Not on file   Other Topics Concern  . Not on file   Social History Narrative   ** Merged History Encounter **        Family History  Problem Relation Age of Onset  . Colon cancer Neg Hx   . Hypertension Mother      Review of Systems:  See HPI for pertinent ROS. All other ROS negative.    Physical Exam: Blood pressure 132/60, pulse 76, temperature 98.4 F (36.9 C), temperature source Oral, resp. rate 18, weight 152 lb (68.947 kg)., Body mass index is 31.78 kg/(m^2). General: WNWD Hispanic Female. Appears in no acute distress. Neck: Supple. No thyromegaly. No lymphadenopathy. No carotid bruit. Lungs: Clear bilaterally to auscultation without wheezes, rales, or rhonchi. Breathing is unlabored. Heart: RRR with S1 S2. No murmurs, rubs, or gallops. Abdomen: Soft, non-tender, non-distended with normoactive bowel sounds. No hepatomegaly. No rebound/guarding. No obvious abdominal  masses. Musculoskeletal:  Strength and tone normal for age. There Is pain with palpation of the right side of the neck over the right trapezius. There is also pain with palpation around the right scapula. Exam of the right shoulder is basically normal. Forward extension is normal. Empty Can  is normal. She can fully abduct. When she does this she says that it does cause some discomfort at the top of the shoulder.  She can even reach her right  hand to her  Low back.  Extremities/Skin: Warm and dry. No clubbing or cyanosis. No edema. No rashes or suspicious lesions. Neuro: Alert and oriented X 3. Moves all extremities spontaneously. Gait is normal. CNII-XII grossly in tact. Psych:  Responds to questions appropriately with a normal affect. Diabetic foot exam: Inspection is normal. Sensation is intact. She has 2+ bilateral posterior tibial pulses. No palpable dorsalis pedis pulse bilaterally.      ASSESSMENT AND PLAN:  79 y.o. year old female with  1. DIABETES MELLITUS, TYPE II  Microalbumin was done 07/26/12  She is on ACE Inh--Lisinopril 10mg  QD She is on Statin--Pravachol 40mg  She is on ASA  Will discuss diabetic eye exam at next OV Will discuss Pneumonia vaccine at next OV  Her last routine scheduled office visit was 04/14/13. At that time her A1c came back at 8.3. We called her and have her come in for followup visit. She came for followup visit on 04/18/13. At that visit we stopped 3 whole medicines and only continued metformin. Started Lantus. She has had one other followup visits since then to followup to make sure she was titrating her Lantus correctly. At that visit she had continued to metformin but has stopped because her oral hypoglycemic medications. She was titrating her Lantus as directed and seen to have a good understanding about this. Today granddaughter reports that she is currently at 20 units of Lantus each bedtime. He did bring in the blood sugar log see the history of  present illness for details of this.  Told them to continue the Lantus at 20 units.  2 need to monitor blood sugar at this dose. Given that she just had a reading of 103 on April 26 which was only 4 days ago I do not think we should just be Lantus dose at this time. Think that today's reading of 237 was because she ate spaghetti last night with a lot of pasta and carbs.  She is due for routine office visit followup of this 07/12/13. Told them to schedule an appointment to be seen around that date.    2. HYPERTENSION Blood pressure is at goal. She is on ACE inhibitor--lisinopril 10 mg daily.  3. Hyponatremia At lab 11/24/12 sodium was low 125. We placed her on fluid restriction and followup lab on 12/10/2012 showed sodium 134.  4. Hyperkalemia See #2 above -CMET was done 04/14/13 and nml.  5. HYPERLIPIDEMIA Had CMET, FLP 04/14/13  6. Osteoporosis On Fosamax. Fosamax was started in the year 2000 according to the patient and daughter (at prior OV). We discussed we would plan to continue it for 5 years and then stop it. AT HER NEXT OV WILL STOP THIS MEDIACTION, AS SHE WILL HAVE COMPLETED THE 5 YEARS.   7. DIVERTICULOSIS, COLON  8. Right shoulder tendinitis At LOV  provided a handout which I reviewed with her son who understands [place] and he can explain this to her in [place]. As discussed his posture changes when he is sitting at rest as well as position changes when doing tasks. It also has multiple stretches and exercises that she can do at home and she is to do these 3 times a day. I recommend that she continue doing the stretches and exercises.  9.Right neck pain: The pain that she is currently experiencing and reporting today--this is secondary to muscle strain of muscles of the right neck and right scapular region. Discussed with patient and granddaughter that given patient's age, he should avoid  muscle relaxers. Recommend that she use heat including heating pad or warm  water in the shower. Stretch her neck and use range of motion of the neck and the shoulders to loosen these muscles. Also try to be very aware of posture and body position  including position of the cervical spine when she is sleeping -including making sure she is using a proper pillow.  10--Decreased appetite and fatigue have only been for the past 3-4 days. Discussed with patient and granddaughter that I do not see any correlation between this and her sugar readings. Apparently that's what the patient was concerned about. Is concerned that she got a high blood sugar reading this morning and concerned she been feeling bad the last few days--- wasn't sure if there was something wrong causing these. Discussed with them that we will simply monitor the symptoms for now. If the symptoms worsen or persist more than one week then followup with me.  11. mammogram: 03/26/12 negative. I'm not sure she's had followup yet this year. We'll discuss again at the next visit.  12. Screening colonoscopy: 06/21/12: Polyp. Based on pathology recommend repeat 5 years. But generally recommended to stop screening colonoscopies at age 65. GI recommended that she discuss this with her PCP at the time of the repeat at age 39 to see whether to do repeat or not. We will discuss it at that time.  13. Immunizations: I am not sure whether she has had pneumonia vaccine or not. WILL PLAN TO GIVE THIS AT NEXT OV Tetanus: Had a 04/2011 Zostavax: She was told to check with her insurance but she has forgotten  Regular office visit  Due around 07/12/13-- or sooner if needed.   Signed, [address] [place], [place], B[facility] 06/23/2013 2:20 PM

## 2013-06-25 ENCOUNTER — Encounter (HOSPITAL_COMMUNITY): Payer: Self-pay | Admitting: Emergency Medicine

## 2013-06-25 ENCOUNTER — Emergency Department (INDEPENDENT_AMBULATORY_CARE_PROVIDER_SITE_OTHER)
Admission: EM | Admit: 2013-06-25 | Discharge: 2013-06-25 | Disposition: A | Discharge status: Home / Self Care | Payer: Medicaid Other | Source: Home / Self Care

## 2013-06-25 DIAGNOSIS — E119 Type 2 diabetes mellitus without complications: Secondary | ICD-10-CM

## 2013-06-25 DIAGNOSIS — N390 Urinary tract infection, site not specified: Secondary | ICD-10-CM

## 2013-06-25 DIAGNOSIS — R81 Glycosuria: Secondary | ICD-10-CM

## 2013-06-25 DIAGNOSIS — R7309 Other abnormal glucose: Secondary | ICD-10-CM

## 2013-06-25 DIAGNOSIS — R739 Hyperglycemia, unspecified: Secondary | ICD-10-CM

## 2013-06-25 LAB — POCT URINALYSIS DIP (DEVICE)
Bilirubin Urine: NEGATIVE
Glucose, UA: >=1000 mg/dL — AB
Ketones, ur: NEGATIVE mg/dL
Nitrite: NEGATIVE
PROTEIN: NEGATIVE mg/dL
Specific Gravity, Urine: <=1.005 (ref 1.005–1.030)
Urobilinogen, UA: 0.2 mg/dL (ref 0.0–1.0)
pH: 6 (ref 5.0–8.0)

## 2013-06-25 LAB — GLUCOSE, CAPILLARY: Glucose-Capillary: 348 mg/dL — ABNORMAL HIGH (ref 70–99)

## 2013-06-25 MED ORDER — CEPHALEXIN 500 MG PO CAPS: 500.0000 mg | ORAL_CAPSULE | Freq: Four times a day (QID) | ORAL | Status: DC

## 2013-06-25 MED ORDER — INSULIN ASPART 100 UNIT/ML ~~LOC~~ SOLN
SUBCUTANEOUS | Status: AC
  Filled 2013-06-25: qty 1

## 2013-06-25 MED ORDER — INSULIN ASPART 100 UNIT/ML ~~LOC~~ SOLN
5.0000 [IU] | Freq: Once | SUBCUTANEOUS | Status: AC
  Administered 2013-06-25: 5 [IU] via SUBCUTANEOUS

## 2013-06-25 NOTE — ED Notes (Signed)
uti symptoms: burning with urination, increased frequency.  Onset of symptoms 4-5 days ago.  No pain, except with urination.

## 2013-06-25 NOTE — ED Provider Notes (Signed)
CSN: [id number]     Arrival date & time 06/25/13  1036 History   First MD Initiated Contact with Patient 06/25/13 1103     Chief Complaint  Patient presents with  . Urinary Tract Infection   (Consider location/radiation/quality/duration/timing/severity/associated sxs/prior Treatment) HPI Comments: 79 y o T2DM c/o dysuria and frequency for 4 days. Hx of UTI's. Denies fever, vomiting, chills, back pain.  Notes her BS's have been high which is a frequent sequela of her UTI's.    Past Medical History  Diagnosis Date  . Hypertension   . Depression     hx of   . Diabetes mellitus without complication   . Chronic kidney disease     hx of kidney stones,   . Arthritis   . Hearing loss of both ears   . Tinnitus of both ears   . MRSA infection     Oct 13 - Nov 13  . Ganglion, left ankle and foot   . Osteoporosis    Past Surgical History  Procedure Laterality Date  . Total hip revision  12/05/2011    Procedure: TOTAL HIP REVISION;  Surgeon: Mcarthur Rossetti, MD;  Location: [facility];  Service: Orthopedics;  Laterality: Right;  Right Hip Revision Arthroplasty to Total Hip, Excision of Old Implant  . Incision and drainage hip  12/22/2011    Procedure: IRRIGATION AND DEBRIDEMENT HIP;  Surgeon: Mcarthur Rossetti, MD;  Location: [facility];  Service: Orthopedics;  Laterality: Right;  Irrigation and debridement right hip  . Incision and drainage hip  12/27/2011    Procedure: IRRIGATION AND DEBRIDEMENT HIP;  Surgeon: Mcarthur Rossetti, MD;  Location: [facility];  Service: Orthopedics;  Laterality: Right;  Repeat irrigation and debridement  . Joint replacement      bilateral knee, right hip   . Back surgery      history restored after error with record merge       Family History  Problem Relation Age of Onset  . Colon cancer Neg Hx   . Hypertension Mother    History  Substance Use Topics  . Smoking status: Never Smoker   . Smokeless tobacco: Never Used  . Alcohol Use: No   OB History    Grav Para Term Preterm Abortions TAB SAB Ect Mult Living                 Review of Systems  Constitutional: Positive for diaphoresis. Negative for fever and appetite change.  HENT: Negative.   Respiratory: Negative.   Cardiovascular: Negative.   Gastrointestinal: Negative.   Genitourinary: Positive for dysuria and frequency. Negative for hematuria, flank pain, vaginal discharge and pelvic pain.  Musculoskeletal: Negative.   Skin: Negative.   Neurological: Negative.     Allergies  Ace inhibitors; Bactrim; Rifampin; and Vancomycin  Home Medications   Prior to Admission medications   Medication Sig Start Date End Date Taking? Authorizing Provider  ACCU-CHEK AVIVA PLUS test strip USE TO TEST BLOOD SUGAR THREE TIMES DAILY 01/25/13   Orlena Sheldon, PA-C  alendronate (FOSAMAX) 70 MG tablet Take 1 tablet (70 mg total) by mouth every 7 (seven) days. Take with a full glass of water on an empty stomach. 07/26/12   Orlena Sheldon, PA-C  aspirin 325 MG tablet Take 325 mg by mouth daily.    Historical Provider, MD  calcium-vitamin D (OSCAL WITH D) 500-200 MG-UNIT per tablet Take 1 tablet by mouth daily.    Historical Provider, MD  Cholecalciferol (VITAMIN D3)  400 UNITS tablet Take 400 Units by mouth daily.    Historical Provider, MD  insulin glargine (LANTUS) 100 units/mL SOLN Inject 10 Units into the skin at bedtime. As directed, up to 40 units at bedtime 04/18/13   Orlena Sheldon, PA-C  Insulin Pen Needle (PEN NEEDLES 31GX5/16") 31G X 8 MM MISC 1 each by Does not apply route daily. 04/18/13   Lonie Peak Dixon, PA-C  lisinopril (PRINIVIL,ZESTRIL) 10 MG tablet Take 1 tablet (10 mg total) by mouth daily. 11/10/12   Lonie Peak Dixon, PA-C  metFORMIN (GLUCOPHAGE) 1000 MG tablet TAKE 1 TABLET BY MOUTH TWICE DAILY    Orlena Sheldon, PA-C  pravastatin (PRAVACHOL) 40 MG tablet TAKE 1 TABLET BY MOUTH DAILY 01/25/13   Lonie Peak Dixon, PA-C   BP 160/68  Pulse 75  Temp(Src) 98.4 F (36.9 C) (Oral)  Resp 18  SpO2  100% Physical Exam  Nursing note and vitals reviewed. Constitutional: She appears well-developed and well-nourished. No distress.  Eyes: Conjunctivae and EOM are normal.  Neck: Normal range of motion. Neck supple.  Cardiovascular: Normal rate, regular rhythm and normal heart sounds.   Pulmonary/Chest: Effort normal and breath sounds normal. No respiratory distress.  Abdominal: Soft. There is no tenderness.  Musculoskeletal: She exhibits no edema.  Neurological: She is alert. She exhibits normal muscle tone.  Skin: Skin is warm and dry.  Psychiatric: She has a normal mood and affect.    ED Course  Procedures (including critical care time) Labs Review Labs Reviewed  GLUCOSE, CAPILLARY - Abnormal; Notable for the following:    Glucose-Capillary 348 (*)    All other components within normal limits  POCT URINALYSIS DIP (DEVICE) - Abnormal; Notable for the following:    Glucose, UA >=1000 (*)    Hgb urine dipstick SMALL (*)    Leukocytes, UA LARGE (*)    All other components within normal limits  URINE CULTURE    Imaging Review No results found.   MDM   1. T2DM (type 2 diabetes mellitus)   2. Hyperglycemia   3. UTI (lower urinary tract infection)   4. Glycosuria    novolog 5 u SQ now. Explained S and S of hypoglycemia to her 2 caretakers present and how to management.  Plenty of fluids Culture urine Keflex 500 qid See PCP next week. For worsening may return or go to the ED      Janne Napoleon, NP 06/25/13 1134

## 2013-06-25 NOTE — Discharge Instructions (Signed)
Nivel elevado de azcar en sangre (High Blood Sugar) El nivel elevado de azcar en la sangre (hiperglucemia) significa que es mayor de lo que debera ser. Entre los signos de hiperglucemia se incluyen:  Sensacin de sed.  Orinar con frecuencia.  Sentirse cansado o con sueo.  [employer].  Cambios en la visin.  Sensacin de debilidad.  Sensacin de [place], Armed forces training and education officer con prdida de [place].  Adormecimiento u hormigueo en las manos o en los pies.  Dolor de [place]. Si se ignoran estas seales, el azcar en la sangre puede seguir subiendo. Estos problemas Scientist, research (medical), y otros problemas pueden Medical laboratory scientific officer. CUIDADOS EN EL HOGAR  Controle los niveles de Museum/gallery exhibitions officer segn le haya indicado el mdico. Anote los valores con la fecha y la hora.  Tome la cantidad correcta de insulina o pldoras para la diabetes en el momento adecuado. Anote las dosis con la fecha y Geologist, engineering.  Reponga la insulina o las pldoras para la diabetes antes de que se le acaben.  Controle lo que come. Siga el plan de alimentacin.  Beba lquidos sin azcar, como agua. Verifique con su mdico si tiene trastornos del rin o del corazn.  Siga las instrucciones del mdico para hacer ejercicio. Haga actividad fsica en el mismo momento del da.  [place]umpla con las citas mdicas. SOLICITE AYUDA DE INMEDIATO SI:   Presenta dificultades para pensar o se siente confundido.  Tiene respiracin rpida y su aliento tiene [place] a frutas.  Se desmaya.  Lleva 2 a 3 das con [employer] de Dispensing optician y no sabe la razn.  Siente dolor en el pecho.  Tiene Higher education careers adviser (nuseas) y vmitos.  Tiene cambios repentinos en la vista. ASEGRESE DE QUE:   Comprende estas instrucciones.  Controlar su enfermedad.  Solicitar ayuda de inmediato si no mejora o empeora. Document Released: 03/15/2010 Document Revised: 05/05/2011 [facility] Patient Information 2014 [place], [place].  Tratamiento con insulina para la  diabetes (Insulin Treatment for Diabetes) La diabetes es una enfermedad que no se cura (crnica). Se produce cuando el cuerpo no utiliza Occupational hygienist (glucosa) que se libera de los alimentos despus de ser digeridos. Una hormona llamada insulina que produce el pncreas controla los niveles de [place]. Segn sea el tipo de diabetes que tenga, se aplicar:   El pncreas no produce nada de insulina (diabetes tipo 1).  El pncreas produce muy poca insulina, y el organismo no puede responder normalmente a la insulina que produce (diabetes tipo 2). Sin insulina se pone en peligro la vida. Sin embargo, con la adicin de Paden, el control de la glucosa en sangre y el tratamiento una persona que sufre diabetes puede vivir una vida plena y productiva. En este documento veremos que papel tiene la insulina en su tratamiento y le daremos informacin sobre su uso.  CMO SE ADMINISTRA LA INSULINA? La insulina es un medicamento que slo se administra por va inyectable. No puede tomarse por va oral porque el cido del estmago la inactivara. La insulina se inyecta debajo de la piel con [place] y [place], un lpiz de [place], una bomba o un inyector de insulina tipo jet. El mdico determinar la dosis segn sus necesidades individuales. Tambin le aconsejarn cul mtodo de administracin de la insulina es el adecuado para usted. Recuerde que si se aplica la insulina con [place] y [place], slo debe usar la jeringa especial para insulina, hecha para este propsito. EN QU LUGAR DEL CUERPO DEBE INYECTARSE LA  INSULINA? La insulina se inyecta en la capa de tejido graso que se encuentra debajo de la piel. Los lugares correctos para inyectar la insulina son la zona superior del brazo, la zona anterior externa del muslo, la cadera y el abdomen. Se prefiere la administracin de la insulina en el abdomen ya que proporciona una absorcin rpida y consistente. Evite la zona que se encuentra a 2  pulgadas (5 cm) alrededor del ombligo y Catering manager en zonas en las que haya tejido cicatrizal. Adems, es importante rotar los sitios de inyeccin en cada aplicacin, pare evitar la irritacin y mejorar la absorcin CULES SON LOS DIFERENTES TIPOS [place]?  Si usted tiene diabetes tipo 1 tiene que recibir insulina para sobrevivir. Su organismo no la produce. Si usted tiene diabetes tipo 2 puede requerir [employer] de, o en lugar de, otros tratamientos. En cada caso, el uso adecuado de la insulina es fundamental para controlar la diabetes.  Hay diferentes tipos de insulina. Generalmente las inyecciones las aplica el mismo [place], aunque pueden entrenarse otras personas para aplicrselas. Algunas personas usan una bomba que administra la insulina continuamente a travs de un tubo blando y flexible (cnula) que se coloca bajo la piel del abdomen. El uso de [place] requiere el control de los niveles de Location manager en sangre varias veces al [employer]. El nmero exacto de veces y el momento del da en que deber controlar la glucosa variar segn el tipo de diabetes, el tipo de [place] y los objetivos del [place]. El mdico lo guiar.  En general, los diferentes tipos de insulina tienen diferentes propiedades. La siguiente es una gua general. Las especificaciones varan segn el producto y peridicamente se introducen nuevos productos.   La insulina de accin rpida  comienza a Chief of Staff rpidamente (en menos de 5 minutos) y desaparece en 4 a 6 horas (a veces ms). Este tipo de insulina funciona bien cuando se aplica antes de una comida para llevar rpidamente el nivel de azcar en la sangre a la normalidad.   Insulina de accin corta comienza a actuar aproximadamente a los 30 minutos y puede durar entre 6 y 63 horas. Este tipo de insulina debe aplicarse aproximadamente 30 minutos antes de comenzar a Scientist, research (physical sciences).  Insulina de accin intermediacomienza a funcionar en 1- 2 horas y se elimina despus de  aproximadamente 10 a 18 horas. Esta insulina disminuir el nivel de azcar en la sangre durante un perodo prolongado, pero no ser tan efectiva para disminuir el nivel de azcar en la sangre inmediatamente despus de una comida.   La insulina de accin prolongada imita la pequea cantidad de insulina que su pncreas habitualmente produce lo largo del da. Es necesario que tenga siempre algo de [place]. Es fundamental para el metabolismo de las neuronas y Production assistant, radio. La insulina de accin prolongada debe ser Jiles Garter una o dos veces al da. Por lo general se utiliza en combinacin con otros tipos de insulina o en combinacin con otros medicamentos para la diabetes.  Comente el tipo de insulina que toma con su mdico o Development worker, international aid. Luego debe conocer cundo puede esperar un pico de insulina y cuando [employer]. Es importante que lo sepa de modo que pueda planificar el momento de la comida y los perodos de actividad fsica.  Generalmente, el mdico tendr en mente una estrategia de tratamiento con insulina. Esto variar con el tipo de diabetes, los objetivos de tratamiento y su historia clnica. Es importante que usted comprenda esta estrategia para que  pueda participar de su tratamiento para la diabetes. Estos son algunos de los trminos que podra escuchar:   Insulina basal. Se refiere a la pequea cantidad de insulina que debe estar presente en la sangre en todo momento. En algunos casos sern suficientes los medicamentos por va oral. En otros casos, y especialmente para las personas con diabetes tipo 1, es Tax adviser insulina. Generalmente se administra insulina de accin intermedia o de accin prolongada una o dos veces al da para Systems developer.   insulina prandial (relacionada con las comidas). El nivel de azcar en la sangre se elevar rpidamente despus de una comida. Se puede administrar insulina de accin corta o insulina de accin rpida inmediatamente antes  de la comida para que el nivel de azcar en la sangre vuelva a la normalidad rpidamente. Es posible que le indiquen que ajuste la cantidad de insulina en funcin de la cantidad de hidratos de carbono(almidones) que contenga la comida.   Insulina correctiva. Posiblemente le indiquen que controle su nivel de azcar en sangre en algunos momentos del da. Entonces podr [employer] pequea cantidad de insulina de accin rpida o de accin corta para que el nivel de azcar en la sangre vuelva a la normalidad, si es elevado.   Control estricto (tambin llamado tratamiento intensivo). El control Pharmacologist el nivel de azcar en la sangre lo ms cerca del objetivo como sea posible y Product/process development scientist que aumente demasiado despus de las comidas. Las personas con control estricto de la diabetes pueden tener menos complicaciones a largo plazo por la diabetes.   Nivel de glucohemoglobina (tambin llamada glico, hemoglobina glicosilada, hemoglobina A1c o A1c). Con esta prueba se mide si la glucosa fue controlada correctamente en los ltimos 1 a 3 meses. Esto ayuda al mdico a comprobar si el tratamiento es efectivo y decidir si es necesario realizar algunos cambios. El Astronomer con usted el nivel de glucohemoglobina objetivo.  El tratamiento con insulina requiere que preste una especial atencin. Los planes de tratamiento sern distintos para cada persona. Algunas personas obtienen buenos resultados con un programa simple. Otros requieren programas ms complicados con mltiples inyecciones diarias de insulina. Usted trabajar junto con su mdico para desarrollar el programa que mejor le [place]. Independientemente de su plan de tratamiento con insulina, deber hacer todo lo posible para controlar el peso, la dieta y Marine scientist de alimentos, los ejercicios, el control de la presin arterial, el control del colesterol y los niveles de Psychologist, forensic.  CULES SON LOS EFECTOS ADVERSOS DE LA  INSULINA? Aunque el tratamiento con insulina es muy importante, tiene algunos [place], como:   La insulina puede hacer que su nivel de azcar en sangre disminuya mucho (hipoglucemia).   Puede causar [employer].   Una tcnica incorrecta de inyeccin puede causar (hiperglucemia), lesiones en la piel u otros problemas. Debe aprender a inyectarse la insulina correctamente. Document Released: 08/12/2011 Document Revised: 10/13/2012 [facility] Patient Information 2014 [place], [place].  Infeccin urinaria  (Urinary Tract Infection)  La infeccin urinaria puede ocurrir en Clinical cytogeneticist del tracto urinario. El tracto urinario es un sistema de drenaje del cuerpo por el que se eliminan los desechos y el exceso de [place]. El tracto urinario est formado por dos riones, dos urteres, la vejiga y Geologist, engineering. Los riones son rganos que tienen forma de frijol. Cada rin tiene aproximadamente el tamao del puo. Estn situados debajo de las [place], uno a cada lado de la columna vertebral CAUSAS  La causa de la infeccin  son los microbios, que son organismos microscpicos, que incluyen hongos, virus, y bacterias. Estos organismos son tan pequeos que slo pueden verse a travs del microscopio. Las bacterias son los microorganismos que ms comnmente causan infecciones urinarias.  SNTOMAS  Los sntomas pueden variar segn la edad y el sexo del paciente y por la ubicacin de la infeccin. Los sntomas en las mujeres jvenes incluyen la necesidad frecuente e intensa de orinar y una sensacin dolorosa de ardor en la vejiga o en la uretra durante la miccin. Las mujeres y los hombres mayores podrn sentir cansancio, temblores y debilidad y Arts development officer musculares y Social research officer, government abdominal. Si tiene [place], puede significar que la infeccin est en los riones. Otros sntomas son dolor en la espalda o en los lados debajo de las [place], nuseas y vmitos.  DIAGNSTICO  Para diagnosticar una infeccin urinaria, el  mdico le preguntar acerca de sus sntomas. [employer] una [place] de [place]. La muestra de orina se analiza para Hydrographic surveyor bacterias y glbulos blancos de Herbalist. Los glbulos blancos se forman en el organismo para ayudar a Radio broadcast assistant las infecciones.  TRATAMIENTO  Por lo general, las infecciones urinarias pueden tratarse con medicamentos. Debido a que la [employer] de las infecciones son causadas por bacterias, por lo general pueden tratarse con antibiticos. La eleccin del antibitico y la duracin del tratamiento depender de sus sntomas y el tipo de bacteria causante de la infeccin.  INSTRUCCIONES PARA EL CUIDADO EN EL HOGAR   Si le recetaron antibiticos, tmelos exactamente como su mdico le indique. Termine el medicamento aunque se sienta mejor despus de haber tomado slo algunos.  Beba gran cantidad de lquido para mantener la orina de tono claro o color amarillo plido.  Evite la cafena, el t y las bebidas gaseosas. Estas sustancias irritan la vejiga.  Vaciar la vejiga con frecuencia. Evite retener la orina durante largos perodos.  Vace la vejiga antes y despus de Clinical biochemist.  Despus de mover el intestino, las mujeres deben higienizarse la regin perineal desde adelante hacia atrs. Use slo un papel tissue por vez. SOLICITE ATENCIN MDICA SI:   Siente dolor en la espalda.  Le sube la fiebre.  Los sntomas no mejoran luego de 3 das. SOLICITE ATENCIN MDICA DE INMEDIATO SI:   Siente dolor intenso en la espalda o en la zona inferior del abdomen.  Comienza a sentir escalofros.  Tiene nuseas o vmitos.  Tiene una sensacin continua de quemazn o molestias al [employer]. ASEGRESE DE QUE:   Comprende estas instrucciones.  Controlar su enfermedad.  Solicitar ayuda de inmediato si no mejora o empeora. Document Released: 11/20/2004 Document Revised: 11/05/2011 [facility] Patient Information 2014 [place], [place].

## 2013-06-26 ENCOUNTER — Inpatient Hospital Stay (HOSPITAL_COMMUNITY)
Admission: EM | Admit: 2013-06-26 | Discharge: 2013-06-28 | DRG: 689 | Disposition: A | Discharge status: Home / Self Care | Payer: Medicaid Other | Attending: Internal Medicine | Admitting: Internal Medicine

## 2013-06-26 ENCOUNTER — Encounter (HOSPITAL_COMMUNITY): Payer: Self-pay | Admitting: Emergency Medicine

## 2013-06-26 DIAGNOSIS — D649 Anemia, unspecified: Secondary | ICD-10-CM | POA: Diagnosis present

## 2013-06-26 DIAGNOSIS — M810 Age-related osteoporosis without current pathological fracture: Secondary | ICD-10-CM | POA: Diagnosis present

## 2013-06-26 DIAGNOSIS — H9190 Unspecified hearing loss, unspecified ear: Secondary | ICD-10-CM | POA: Diagnosis present

## 2013-06-26 DIAGNOSIS — E1010 Type 1 diabetes mellitus with ketoacidosis without coma: Secondary | ICD-10-CM | POA: Diagnosis present

## 2013-06-26 DIAGNOSIS — I129 Hypertensive chronic kidney disease with stage 1 through stage 4 chronic kidney disease, or unspecified chronic kidney disease: Secondary | ICD-10-CM | POA: Diagnosis present

## 2013-06-26 DIAGNOSIS — F3289 Other specified depressive episodes: Secondary | ICD-10-CM | POA: Diagnosis present

## 2013-06-26 DIAGNOSIS — F329 Major depressive disorder, single episode, unspecified: Secondary | ICD-10-CM | POA: Diagnosis present

## 2013-06-26 DIAGNOSIS — Z96649 Presence of unspecified artificial hip joint: Secondary | ICD-10-CM

## 2013-06-26 DIAGNOSIS — Z888 Allergy status to other drugs, medicaments and biological substances status: Secondary | ICD-10-CM

## 2013-06-26 DIAGNOSIS — Z881 Allergy status to other antibiotic agents status: Secondary | ICD-10-CM

## 2013-06-26 DIAGNOSIS — N189 Chronic kidney disease, unspecified: Secondary | ICD-10-CM | POA: Diagnosis present

## 2013-06-26 DIAGNOSIS — Z96659 Presence of unspecified artificial knee joint: Secondary | ICD-10-CM

## 2013-06-26 DIAGNOSIS — E875 Hyperkalemia: Secondary | ICD-10-CM

## 2013-06-26 DIAGNOSIS — A498 Other bacterial infections of unspecified site: Secondary | ICD-10-CM | POA: Diagnosis present

## 2013-06-26 DIAGNOSIS — E871 Hypo-osmolality and hyponatremia: Secondary | ICD-10-CM | POA: Diagnosis present

## 2013-06-26 DIAGNOSIS — Z7982 Long term (current) use of aspirin: Secondary | ICD-10-CM

## 2013-06-26 DIAGNOSIS — E785 Hyperlipidemia, unspecified: Secondary | ICD-10-CM | POA: Diagnosis present

## 2013-06-26 DIAGNOSIS — M129 Arthropathy, unspecified: Secondary | ICD-10-CM | POA: Diagnosis present

## 2013-06-26 DIAGNOSIS — Z8249 Family history of ischemic heart disease and other diseases of the circulatory system: Secondary | ICD-10-CM

## 2013-06-26 DIAGNOSIS — N390 Urinary tract infection, site not specified: Principal | ICD-10-CM | POA: Diagnosis present

## 2013-06-26 DIAGNOSIS — E1110 Type 2 diabetes mellitus with ketoacidosis without coma: Secondary | ICD-10-CM | POA: Diagnosis present

## 2013-06-26 DIAGNOSIS — E119 Type 2 diabetes mellitus without complications: Secondary | ICD-10-CM

## 2013-06-26 DIAGNOSIS — Z794 Long term (current) use of insulin: Secondary | ICD-10-CM

## 2013-06-26 DIAGNOSIS — K5730 Diverticulosis of large intestine without perforation or abscess without bleeding: Secondary | ICD-10-CM

## 2013-06-26 DIAGNOSIS — I10 Essential (primary) hypertension: Secondary | ICD-10-CM | POA: Diagnosis present

## 2013-06-26 LAB — COMPREHENSIVE METABOLIC PANEL
ALBUMIN: 3 g/dL — AB (ref 3.5–5.2)
ALK PHOS: 160 U/L — AB (ref 39–117)
ALT: 35 U/L (ref 0–35)
AST: 47 U/L — ABNORMAL HIGH (ref 0–37)
BILIRUBIN TOTAL: 0.5 mg/dL (ref 0.3–1.2)
BUN: 16 mg/dL (ref 6–23)
CO2: 19 meq/L (ref 19–32)
Calcium: 9.2 mg/dL (ref 8.4–10.5)
Chloride: 88 mEq/L — ABNORMAL LOW (ref 96–112)
Creatinine, Ser: 1.09 mg/dL (ref 0.50–1.10)
GFR calc Af Amer: 56 mL/min — ABNORMAL LOW (ref >90)
GFR calc non Af Amer: 48 mL/min — ABNORMAL LOW (ref >90)
GLUCOSE: 488 mg/dL — AB (ref 70–99)
POTASSIUM: 5.8 meq/L — AB (ref 3.7–5.3)
Sodium: 124 mEq/L — ABNORMAL LOW (ref 137–147)
Total Protein: 8.4 g/dL — ABNORMAL HIGH (ref 6.0–8.3)

## 2013-06-26 LAB — CBC WITH DIFFERENTIAL/PLATELET
Basophils Absolute: 0 10*3/uL (ref 0.0–0.1)
Basophils Relative: 0 % (ref 0–1)
EOS PCT: 0 % (ref 0–5)
Eosinophils Absolute: 0 10*3/uL (ref 0.0–0.7)
HEMATOCRIT: 34.2 % — AB (ref 36.0–46.0)
Hemoglobin: 11.8 g/dL — ABNORMAL LOW (ref 12.0–15.0)
LYMPHS ABS: 1.8 10*3/uL (ref 0.7–4.0)
Lymphocytes Relative: 15 % (ref 12–46)
MCH: 27.6 pg (ref 26.0–34.0)
MCHC: 34.5 g/dL (ref 30.0–36.0)
MCV: 80.1 fL (ref 78.0–100.0)
Monocytes Absolute: 1.8 10*3/uL — ABNORMAL HIGH (ref 0.1–1.0)
Monocytes Relative: 15 % — ABNORMAL HIGH (ref 3–12)
NEUTROS ABS: 8.1 10*3/uL — AB (ref 1.7–7.7)
Neutrophils Relative %: 70 % (ref 43–77)
Platelets: 266 10*3/uL (ref 150–400)
RBC: 4.27 MIL/uL (ref 3.87–5.11)
RDW: 14.4 % (ref 11.5–15.5)
SMEAR REVIEW: ADEQUATE
WBC: 11.7 10*3/uL — ABNORMAL HIGH (ref 4.0–10.5)

## 2013-06-26 MED ORDER — DEXTROSE 5 % IV SOLN
1.0000 g | Freq: Once | INTRAVENOUS | Status: AC
  Administered 2013-06-26: 1 g via INTRAVENOUS
  Filled 2013-06-26: qty 10

## 2013-06-26 MED ORDER — DEXTROSE 50 % IV SOLN
25.0000 mL | INTRAVENOUS | Status: DC | PRN
Start: 2013-06-26 — End: 2013-06-27

## 2013-06-26 MED ORDER — SODIUM CHLORIDE 0.9 % IV SOLN
INTRAVENOUS | Status: DC
  Administered 2013-06-27: 3.5 [IU]/h via INTRAVENOUS
  Filled 2013-06-26: qty 1

## 2013-06-26 MED ORDER — SODIUM CHLORIDE 0.9 % IV BOLUS (SEPSIS)
1000.0000 mL | Freq: Once | INTRAVENOUS | Status: AC
  Administered 2013-06-26: 1000 mL via INTRAVENOUS

## 2013-06-26 MED ORDER — SODIUM CHLORIDE 0.9 % IV SOLN
INTRAVENOUS | Status: DC
  Administered 2013-06-27: via INTRAVENOUS

## 2013-06-26 MED ORDER — INSULIN REGULAR BOLUS VIA INFUSION
0.0000 [IU] | Freq: Three times a day (TID) | INTRAVENOUS | Status: DC
  Filled 2013-06-26: qty 10

## 2013-06-26 MED ORDER — DEXTROSE-NACL 5-0.45 % IV SOLN: INTRAVENOUS | Status: DC

## 2013-06-26 NOTE — ED Notes (Signed)
CBG [id number] 

## 2013-06-26 NOTE — ED Notes (Signed)
Phleb attempting to draw lab.

## 2013-06-26 NOTE — ED Notes (Signed)
The pt has been seen by a doctor and diagnosed with  ?? A uti and was given keflex  Last week.  Today her glucose is high.  Non-insulin dependent  diabetic

## 2013-06-26 NOTE — ED Notes (Signed)
The daughter reports that the pt was seen yesterday not last week.  There is a language barrier

## 2013-06-26 NOTE — ED Notes (Addendum)
Marnette Burgess MD at bedside.

## 2013-06-26 NOTE — ED Provider Notes (Signed)
CSN: [id number]     Arrival date & time 06/26/13  1950 History   First MD Initiated Contact with Patient 06/26/13 2257     Chief Complaint  Patient presents with  . Hyperglycemia     (Consider location/radiation/quality/duration/timing/severity/associated sxs/prior Treatment) HPI Hx per PT thru Spanish speaking interpretor - PT is Spanish speaking primarily.  UTI symptoms x 5 days with dysuria and strong smelling urine. She also has elevated blood sugars.  She was evaluated at [facility] yesterday prescribed Keflex, tonight blood sugars over 400 despite ABx and insulin.  No F/C, no ABD pain or back pain, no N/V/D.  PT complains of inc thirst and urination.   Past Medical History  Diagnosis Date  . Hypertension   . Depression     hx of   . Diabetes mellitus without complication   . Chronic kidney disease     hx of kidney stones,   . Arthritis   . Hearing loss of both ears   . Tinnitus of both ears   . MRSA infection     Oct 13 - Nov 13  . Ganglion, left ankle and foot   . Osteoporosis    Past Surgical History  Procedure Laterality Date  . Total hip revision  12/05/2011    Procedure: TOTAL HIP REVISION;  Surgeon: Mcarthur Rossetti, MD;  Location: [facility];  Service: Orthopedics;  Laterality: Right;  Right Hip Revision Arthroplasty to Total Hip, Excision of Old Implant  . Incision and drainage hip  12/22/2011    Procedure: IRRIGATION AND DEBRIDEMENT HIP;  Surgeon: Mcarthur Rossetti, MD;  Location: [place];  Service: Orthopedics;  Laterality: Right;  Irrigation and debridement right hip  . Incision and drainage hip  12/27/2011    Procedure: IRRIGATION AND DEBRIDEMENT HIP;  Surgeon: Mcarthur Rossetti, MD;  Location: [place];  Service: Orthopedics;  Laterality: Right;  Repeat irrigation and debridement  . Joint replacement      bilateral knee, right hip   . Back surgery      history restored after error with record merge       Family History  Problem Relation Age of Onset  .  Colon cancer Neg Hx   . Hypertension Mother    History  Substance Use Topics  . Smoking status: Never Smoker   . Smokeless tobacco: Never Used  . Alcohol Use: No   OB History   Grav Para Term Preterm Abortions TAB SAB Ect Mult Living                 Review of Systems  Constitutional: Negative for fever and chills.  Eyes: Negative for visual disturbance.  Respiratory: Negative for shortness of breath.   Cardiovascular: Negative for chest pain.  Gastrointestinal: Negative for vomiting and abdominal pain.  Endocrine: Positive for polyphagia and polyuria.  Genitourinary: Positive for dysuria and frequency.  Musculoskeletal: Negative for back pain.  Skin: Negative for rash.  Neurological: Negative for weakness and numbness.  All other systems reviewed and are negative.     Allergies  Ace inhibitors; Bactrim; Rifampin; and Vancomycin  Home Medications   Prior to Admission medications   Medication Sig Start Date End Date Taking? Authorizing Provider  ACCU-CHEK AVIVA PLUS test strip USE TO TEST BLOOD SUGAR THREE TIMES DAILY 01/25/13   Orlena Sheldon, PA-C  alendronate (FOSAMAX) 70 MG tablet Take 1 tablet (70 mg total) by mouth every 7 (seven) days. Take with a full glass of water on an empty  stomach. 07/26/12   Orlena Sheldon, PA-C  aspirin 325 MG tablet Take 325 mg by mouth daily.    Historical Provider, MD  calcium-vitamin D (OSCAL WITH D) 500-200 MG-UNIT per tablet Take 1 tablet by mouth daily.    Historical Provider, MD  cephALEXin (KEFLEX) 500 MG capsule Take 1 capsule (500 mg total) by mouth 4 (four) times daily. 06/25/13   Janne Napoleon, NP  Cholecalciferol (VITAMIN D3) 400 UNITS tablet Take 400 Units by mouth daily.    Historical Provider, MD  insulin glargine (LANTUS) 100 units/mL SOLN Inject 10 Units into the skin at bedtime. As directed, up to 40 units at bedtime 04/18/13   Orlena Sheldon, PA-C  Insulin Pen Needle (PEN NEEDLES 31GX5/16") 31G X 8 MM MISC 1 each by Does not apply route  daily. 04/18/13   Lonie Peak Dixon, PA-C  lisinopril (PRINIVIL,ZESTRIL) 10 MG tablet Take 1 tablet (10 mg total) by mouth daily. 11/10/12   Lonie Peak Dixon, PA-C  metFORMIN (GLUCOPHAGE) 1000 MG tablet TAKE 1 TABLET BY MOUTH TWICE DAILY    Mary B Dixon, PA-C  pravastatin (PRAVACHOL) 40 MG tablet TAKE 1 TABLET BY MOUTH DAILY 01/25/13   Lonie Peak Dixon, PA-C   BP 140/56  Pulse 84  Temp(Src) 99.8 F (37.7 C)  Resp 18  Ht 4\' 9"  (1.448 m)  Wt 158 lb (71.668 kg)  BMI 34.18 kg/m2  SpO2 98% Physical Exam  Constitutional: She is oriented to person, place, and time. She appears well-developed and well-nourished.  HENT:  Head: Normocephalic and atraumatic.  Mildly dry mm  Eyes: EOM are normal. Pupils are equal, round, and reactive to light. No scleral icterus.  Neck: Neck supple.  Cardiovascular: Normal rate, regular rhythm and intact distal pulses.   Pulmonary/Chest: Effort normal and breath sounds normal. No respiratory distress. She exhibits no tenderness.  Abdominal: Soft. Bowel sounds are normal. She exhibits no distension. There is no tenderness.  No CVAT  Musculoskeletal: Normal range of motion. She exhibits no edema and no tenderness.  Neurological: She is alert and oriented to person, place, and time. No cranial nerve deficit.  Skin: Skin is warm and dry.    ED Course  Procedures (including critical care time) Labs Review Labs Reviewed  CBC WITH DIFFERENTIAL - Abnormal; Notable for the following:    WBC 11.7 (*)    Hemoglobin 11.8 (*)    HCT 34.2 (*)    Monocytes Relative 15 (*)    Neutro Abs 8.1 (*)    Monocytes Absolute 1.8 (*)    All other components within normal limits  COMPREHENSIVE METABOLIC PANEL - Abnormal; Notable for the following:    Sodium 124 (*)    Potassium 5.8 (*)    Chloride 88 (*)    Glucose, Bld 488 (*)    Total Protein 8.4 (*)    Albumin 3.0 (*)    AST 47 (*)    Alkaline Phosphatase 160 (*)    GFR calc non Af Amer 48 (*)    GFR calc Af Amer 56 (*)    All  other components within normal limits  CBG MONITORING, ED - Abnormal; Notable for the following:    Glucose-Capillary 412 (*)    All other components within normal limits  I-STAT ARTERIAL BLOOD GAS, ED - Abnormal; Notable for the following:    pH, Arterial 7.346 (*)    pCO2 arterial 33.3 (*)    pO2, Arterial 74.0 (*)    Bicarbonate 18.2 (*)  Acid-base deficit 7.0 (*)    All other components within normal limits  CBG MONITORING, ED - Abnormal; Notable for the following:    Glucose-Capillary 371 (*)    All other components within normal limits  URINE CULTURE  BLOOD GAS, ARTERIAL  URINALYSIS, ROUTINE W REFLEX MICROSCOPIC      Date: 06/27/2013  Rate: 84   Rhythm: normal sinus rhythm  QRS Axis: normal  Intervals: normal  ST/T Wave abnormalities: nonspecific ST changes  Conduction Disutrbances:none  Narrative Interpretation: Sinus with low-voltage  Old EKG Reviewed: none available  CRITICAL CARE Performed by: Teressa Lower Total critical care time: 30 Critical care time was exclusive of separately billable procedures and treating other patients. Critical care was necessary to treat or prevent imminent or life-threatening deterioration. Critical care was time spent personally by me on the following activities: development of treatment plan with patient and/or surrogate as well as nursing, discussions with consultants, evaluation of patient's response to treatment, examination of patient, obtaining history from patient or surrogate, ordering and performing treatments and interventions, ordering and review of laboratory studies, ordering and review of radiographic studies, pulse oximetry and re-evaluation of patient's condition.  IV fluids. IV Rocephin. IV glucose stabilizer. EKG does not demonstrate peak T waves with mildly elevated past. ABG concerning for early DKA. Discussed with Dr. Earlean Polka, plan continue IVFs, insulin, ABx and admit  MDM   Dx: DKA, UTI  Elderly diabetic  female presenting with persistent hyperglycemia and UTI symptoms, failed outpatient management for UTI. Found to be slightly acidotic with low bicarbonate and anion gap. Evaluated with EKG, ABG, labs and urinalysis. Urine culture is pending. MED admit    Teressa Lower, MD 06/27/13 [phone]

## 2013-06-27 ENCOUNTER — Encounter (HOSPITAL_COMMUNITY): Payer: Self-pay | Admitting: General Practice

## 2013-06-27 DIAGNOSIS — N390 Urinary tract infection, site not specified: Secondary | ICD-10-CM | POA: Diagnosis present

## 2013-06-27 DIAGNOSIS — E785 Hyperlipidemia, unspecified: Secondary | ICD-10-CM | POA: Diagnosis present

## 2013-06-27 DIAGNOSIS — M810 Age-related osteoporosis without current pathological fracture: Secondary | ICD-10-CM

## 2013-06-27 DIAGNOSIS — E871 Hypo-osmolality and hyponatremia: Secondary | ICD-10-CM

## 2013-06-27 DIAGNOSIS — E1110 Type 2 diabetes mellitus with ketoacidosis without coma: Secondary | ICD-10-CM | POA: Diagnosis present

## 2013-06-27 DIAGNOSIS — K5730 Diverticulosis of large intestine without perforation or abscess without bleeding: Secondary | ICD-10-CM

## 2013-06-27 DIAGNOSIS — I10 Essential (primary) hypertension: Secondary | ICD-10-CM

## 2013-06-27 DIAGNOSIS — E875 Hyperkalemia: Secondary | ICD-10-CM

## 2013-06-27 DIAGNOSIS — E119 Type 2 diabetes mellitus without complications: Secondary | ICD-10-CM

## 2013-06-27 LAB — URINALYSIS, ROUTINE W REFLEX MICROSCOPIC
Bilirubin Urine: NEGATIVE
Glucose, UA: >1000 mg/dL — AB
Ketones, ur: NEGATIVE mg/dL
NITRITE: NEGATIVE
Protein, ur: 100 mg/dL — AB
SPECIFIC GRAVITY, URINE: 1.023 (ref 1.005–1.030)
Urobilinogen, UA: 0.2 mg/dL (ref 0.0–1.0)
pH: 6 (ref 5.0–8.0)

## 2013-06-27 LAB — URINE MICROSCOPIC-ADD ON

## 2013-06-27 LAB — GLUCOSE, CAPILLARY
GLUCOSE-CAPILLARY: 293 mg/dL — AB (ref 70–99)
GLUCOSE-CAPILLARY: 170 mg/dL — AB (ref 70–99)
GLUCOSE-CAPILLARY: 207 mg/dL — AB (ref 70–99)
GLUCOSE-CAPILLARY: 227 mg/dL — AB (ref 70–99)
GLUCOSE-CAPILLARY: 203 mg/dL — AB (ref 70–99)
GLUCOSE-CAPILLARY: 192 mg/dL — AB (ref 70–99)
GLUCOSE-CAPILLARY: 360 mg/dL — AB (ref 70–99)
Glucose-Capillary: 124 mg/dL — ABNORMAL HIGH (ref 70–99)
Glucose-Capillary: 132 mg/dL — ABNORMAL HIGH (ref 70–99)
Glucose-Capillary: 184 mg/dL — ABNORMAL HIGH (ref 70–99)
Glucose-Capillary: 239 mg/dL — ABNORMAL HIGH (ref 70–99)
Glucose-Capillary: 267 mg/dL — ABNORMAL HIGH (ref 70–99)

## 2013-06-27 LAB — CBC WITH DIFFERENTIAL/PLATELET
Basophils Absolute: 0.1 10*3/uL (ref 0.0–0.1)
Basophils Relative: 1 % (ref 0–1)
EOS ABS: 0.2 10*3/uL (ref 0.0–0.7)
Eosinophils Relative: 2 % (ref 0–5)
HCT: 28.6 % — ABNORMAL LOW (ref 36.0–46.0)
Hemoglobin: 9.5 g/dL — ABNORMAL LOW (ref 12.0–15.0)
LYMPHS PCT: 29 % (ref 12–46)
Lymphs Abs: 2.4 10*3/uL (ref 0.7–4.0)
MCH: 26.9 pg (ref 26.0–34.0)
MCHC: 33.2 g/dL (ref 30.0–36.0)
MCV: 81 fL (ref 78.0–100.0)
MONO ABS: 1.4 10*3/uL — AB (ref 0.1–1.0)
Monocytes Relative: 17 % — ABNORMAL HIGH (ref 3–12)
NEUTROS PCT: 51 % (ref 43–77)
Neutro Abs: 4.2 10*3/uL (ref 1.7–7.7)
Platelets: 238 10*3/uL (ref 150–400)
RBC: 3.53 MIL/uL — ABNORMAL LOW (ref 3.87–5.11)
RDW: 14.6 % (ref 11.5–15.5)
SMEAR REVIEW: ADEQUATE
WBC: 8.3 10*3/uL (ref 4.0–10.5)

## 2013-06-27 LAB — BASIC METABOLIC PANEL
BUN: 12 mg/dL (ref 6–23)
BUN: 12 mg/dL (ref 6–23)
BUN: 12 mg/dL (ref 6–23)
CALCIUM: 7.8 mg/dL — AB (ref 8.4–10.5)
CALCIUM: 8 mg/dL — AB (ref 8.4–10.5)
CALCIUM: 8.1 mg/dL — AB (ref 8.4–10.5)
CO2: 21 mEq/L (ref 19–32)
CO2: 20 mEq/L (ref 19–32)
CO2: 20 mEq/L (ref 19–32)
CREATININE: 0.89 mg/dL (ref 0.50–1.10)
CREATININE: 0.91 mg/dL (ref 0.50–1.10)
Chloride: 104 mEq/L (ref 96–112)
Chloride: 104 mEq/L (ref 96–112)
Chloride: 101 mEq/L (ref 96–112)
Creatinine, Ser: 0.94 mg/dL (ref 0.50–1.10)
GFR calc Af Amer: 67 mL/min — ABNORMAL LOW (ref >90)
GFR calc Af Amer: 71 mL/min — ABNORMAL LOW (ref >90)
GFR calc Af Amer: 69 mL/min — ABNORMAL LOW (ref >90)
GFR calc non Af Amer: 57 mL/min — ABNORMAL LOW (ref >90)
GFR, EST NON AFRICAN AMERICAN: 61 mL/min — AB (ref >90)
GFR, EST NON AFRICAN AMERICAN: 60 mL/min — AB (ref >90)
GLUCOSE: 218 mg/dL — AB (ref 70–99)
GLUCOSE: 229 mg/dL — AB (ref 70–99)
Glucose, Bld: 133 mg/dL — ABNORMAL HIGH (ref 70–99)
POTASSIUM: 4.1 meq/L (ref 3.7–5.3)
Potassium: 4 mEq/L (ref 3.7–5.3)
Potassium: 4 mEq/L (ref 3.7–5.3)
SODIUM: 136 meq/L — AB (ref 137–147)
Sodium: 137 mEq/L (ref 137–147)
Sodium: 132 mEq/L — ABNORMAL LOW (ref 137–147)

## 2013-06-27 LAB — CBG MONITORING, ED
GLUCOSE-CAPILLARY: 371 mg/dL — AB (ref 70–99)
Glucose-Capillary: 412 mg/dL — ABNORMAL HIGH (ref 70–99)

## 2013-06-27 LAB — I-STAT ARTERIAL BLOOD GAS, ED
Acid-base deficit: 7 mmol/L — ABNORMAL HIGH (ref 0.0–2.0)
Bicarbonate: 18.2 mEq/L — ABNORMAL LOW (ref 20.0–24.0)
O2 Saturation: 94 %
PCO2 ART: 33.3 mmHg — AB (ref 35.0–45.0)
Patient temperature: 98.6
TCO2: 19 mmol/L (ref 0–100)
pH, Arterial: 7.346 — ABNORMAL LOW (ref 7.350–7.450)
pO2, Arterial: 74 mmHg — ABNORMAL LOW (ref 80.0–100.0)

## 2013-06-27 LAB — TROPONIN I

## 2013-06-27 LAB — MRSA PCR SCREENING: MRSA by PCR: NEGATIVE

## 2013-06-27 LAB — HEMOGLOBIN A1C
Hgb A1c MFr Bld: 8.7 % — ABNORMAL HIGH (ref <5.7)
Mean Plasma Glucose: 203 mg/dL — ABNORMAL HIGH (ref <117)

## 2013-06-27 LAB — HEPATIC FUNCTION PANEL
ALBUMIN: 2.3 g/dL — AB (ref 3.5–5.2)
ALT: 23 U/L (ref 0–35)
AST: 24 U/L (ref 0–37)
Alkaline Phosphatase: 87 U/L (ref 39–117)
BILIRUBIN TOTAL: 0.3 mg/dL (ref 0.3–1.2)
Bilirubin, Direct: <0.2 mg/dL (ref 0.0–0.3)
TOTAL PROTEIN: 6.4 g/dL (ref 6.0–8.3)

## 2013-06-27 MED ORDER — DEXTROSE 5 % IV SOLN
1.0000 g | INTRAVENOUS | Status: DC
  Administered 2013-06-27: 1 g via INTRAVENOUS
  Filled 2013-06-27 (×2): qty 10

## 2013-06-27 MED ORDER — DEXTROSE-NACL 5-0.45 % IV SOLN
INTRAVENOUS | Status: DC
  Administered 2013-06-27: 03:00:00 via INTRAVENOUS

## 2013-06-27 MED ORDER — SODIUM CHLORIDE 0.9 % IV SOLN
INTRAVENOUS | Status: DC
  Administered 2013-06-27: 10:00:00 via INTRAVENOUS

## 2013-06-27 MED ORDER — INSULIN GLARGINE 100 UNIT/ML ~~LOC~~ SOLN
30.0000 [IU] | Freq: Every day | SUBCUTANEOUS | Status: DC
  Administered 2013-06-27: 30 [IU] via SUBCUTANEOUS
  Filled 2013-06-27 (×2): qty 0.3

## 2013-06-27 MED ORDER — SIMVASTATIN 20 MG PO TABS
20.0000 mg | ORAL_TABLET | Freq: Every day | ORAL | Status: DC
  Administered 2013-06-27: 20 mg via ORAL
  Filled 2013-06-27 (×2): qty 1

## 2013-06-27 MED ORDER — SODIUM CHLORIDE 0.9 % IV BOLUS (SEPSIS)
1000.0000 mL | Freq: Once | INTRAVENOUS | Status: AC
  Administered 2013-06-27: 1000 mL via INTRAVENOUS

## 2013-06-27 MED ORDER — LISINOPRIL 10 MG PO TABS
10.0000 mg | ORAL_TABLET | Freq: Every day | ORAL | Status: DC
  Filled 2013-06-27: qty 1

## 2013-06-27 MED ORDER — ENOXAPARIN SODIUM 40 MG/0.4ML ~~LOC~~ SOLN
40.0000 mg | SUBCUTANEOUS | Status: DC
  Administered 2013-06-27 – 2013-06-28 (×2): 40 mg via SUBCUTANEOUS
  Filled 2013-06-27 (×2): qty 0.4

## 2013-06-27 MED ORDER — LIVING WELL WITH DIABETES BOOK - IN SPANISH
Freq: Once | Status: AC
  Administered 2013-06-27: 18:00:00
  Filled 2013-06-27: qty 1

## 2013-06-27 MED ORDER — SODIUM CHLORIDE 0.9 % IV SOLN: INTRAVENOUS | Status: DC

## 2013-06-27 MED ORDER — SODIUM CHLORIDE 0.9 % IV SOLN
INTRAVENOUS | Status: AC
  Administered 2013-06-27: 5.7 [IU]/h via INTRAVENOUS

## 2013-06-27 MED ORDER — ASPIRIN 325 MG PO TABS
325.0000 mg | ORAL_TABLET | Freq: Every day | ORAL | Status: DC
  Administered 2013-06-27 – 2013-06-28 (×2): 325 mg via ORAL
  Filled 2013-06-27 (×2): qty 1

## 2013-06-27 MED ORDER — POTASSIUM CHLORIDE 10 MEQ/100ML IV SOLN: 10.0000 meq | INTRAVENOUS | Status: DC

## 2013-06-27 MED ORDER — INSULIN ASPART 100 UNIT/ML ~~LOC~~ SOLN
0.0000 [IU] | Freq: Three times a day (TID) | SUBCUTANEOUS | Status: DC
Start: 2013-06-27 — End: 2013-06-28
  Administered 2013-06-27: 2 [IU] via SUBCUTANEOUS
  Administered 2013-06-27: 9 [IU] via SUBCUTANEOUS
  Administered 2013-06-28 (×2): 3 [IU] via SUBCUTANEOUS

## 2013-06-27 MED ORDER — DEXTROSE 50 % IV SOLN: 25.0000 mL | INTRAVENOUS | Status: DC | PRN

## 2013-06-27 MED ORDER — INSULIN ASPART 100 UNIT/ML ~~LOC~~ SOLN
4.0000 [IU] | Freq: Three times a day (TID) | SUBCUTANEOUS | Status: DC
  Administered 2013-06-27 (×2): 4 [IU] via SUBCUTANEOUS

## 2013-06-27 NOTE — H&P (Signed)
[facility] History and Physical  Joyce Gilmore GGY:[id number] DOB: [birth year] DOA: 06/26/2013  Referring physician: ER physician. PCP: Karis Juba, PA-C   Chief Complaint: Dysuria and increased blood sugar.  Most of the history obtained from family member as patient speaks [place].  HPI: Joyce Gilmore is a 79 y.o. female with history of hypertension, diabetes mellitus on insulin, hyperlipidemia, chronic anemia and previous history of prosthetic joint infection presented to the ER because of increasing dysuria and frequency with increased blood sugar. Patient has been having these symptoms for over a week. Denies any nausea vomiting chills. Patient has suprapubic pain with dysuria and frequency. Patient had gone to urgent care Center today ago and was prescribed Keflex despite which patient is still having symptoms and blood sugars were running high. Patient states she has not missed her medications. Denies any chest pain or shortness of breath has occasional nonproductive cough. In the ER patient wishes was found to be elevated with hyponatremia and mild hyperkalemia. Patient also had mild anion gap acidosis. Patient will be admitted for further workup. Patient was started on IV insulin for early DKA and ceftriaxone for UTI.   Review of Systems: As presented in the history of presenting illness, rest negative.  Past Medical History  Diagnosis Date  . Hypertension   . Depression     hx of   . Diabetes mellitus without complication   . Chronic kidney disease     hx of kidney stones,   . Arthritis   . Hearing loss of both ears   . Tinnitus of both ears   . MRSA infection     Oct 13 - Nov 13  . Ganglion, left ankle and foot   . Osteoporosis    Past Surgical History  Procedure Laterality Date  . Total hip revision  12/05/2011    Procedure: TOTAL HIP REVISION;  Surgeon: Mcarthur Rossetti, MD;  Location: [facility];  Service: Orthopedics;   Laterality: Right;  Right Hip Revision Arthroplasty to Total Hip, Excision of Old Implant  . Incision and drainage hip  12/22/2011    Procedure: IRRIGATION AND DEBRIDEMENT HIP;  Surgeon: Mcarthur Rossetti, MD;  Location: [place];  Service: Orthopedics;  Laterality: Right;  Irrigation and debridement right hip  . Incision and drainage hip  12/27/2011    Procedure: IRRIGATION AND DEBRIDEMENT HIP;  Surgeon: Mcarthur Rossetti, MD;  Location: [place];  Service: Orthopedics;  Laterality: Right;  Repeat irrigation and debridement  . Joint replacement      bilateral knee, right hip   . Back surgery      history restored after error with record merge       Social History:  reports that she has never smoked. She has never used smokeless tobacco. She reports that she does not drink alcohol or use illicit drugs. Where does patient live home. Can patient participate in ADLs? Yes.  Allergies  Allergen Reactions  . Ace Inhibitors     Hyperkalemia  . Bactrim [Sulfamethoxazole-Trimethoprim]     Hyperkalemia  . Rifampin Other (See Comments)    Severe thrombocytopenia  . Vancomycin Other (See Comments)    Severe thrombocytopenia    Family History:  Family History  Problem Relation Age of Onset  . Colon cancer Neg Hx   . Hypertension Mother       Prior to Admission medications   Medication Sig Start Date End Date Taking? Authorizing Provider  alendronate (FOSAMAX) 70 MG tablet Take  70 mg by mouth once a week. Take with a full glass of water on an empty stomach. Wednesday.   Yes Historical Provider, MD  aspirin 325 MG tablet Take 325 mg by mouth daily.   Yes Historical Provider, MD  calcium-vitamin D (OSCAL WITH D) 500-200 MG-UNIT per tablet Take 1 tablet by mouth daily.   Yes Historical Provider, MD  cephALEXin (KEFLEX) 500 MG capsule Take 500 mg by mouth 4 (four) times daily. 06/25/13 07/02/13 Yes Historical Provider, MD  Cholecalciferol (VITAMIN D3) 400 UNITS tablet Take 400 Units by mouth  daily.   Yes Historical Provider, MD  insulin glargine (LANTUS) 100 units/mL SOLN Inject 10 Units into the skin at bedtime. As directed, up to 40 units at bedtime 04/18/13  Yes Orlena Sheldon, PA-C  lisinopril (PRINIVIL,ZESTRIL) 10 MG tablet Take 1 tablet (10 mg total) by mouth daily. 11/10/12  Yes Mary B Dixon, PA-C  metFORMIN (GLUCOPHAGE) 1000 MG tablet Take 1,000 mg by mouth 2 (two) times daily with a meal.   Yes Historical Provider, MD  pravastatin (PRAVACHOL) 40 MG tablet Take 40 mg by mouth daily.   Yes Historical Provider, MD  ACCU-CHEK AVIVA PLUS test strip USE TO TEST BLOOD SUGAR THREE TIMES DAILY 01/25/13   Orlena Sheldon, PA-C  Insulin Pen Needle (PEN NEEDLES 31GX5/16") 31G X 8 MM MISC 1 each by Does not apply route daily. 04/18/13   Orlena Sheldon, PA-C    Physical Exam: Filed Vitals:   06/26/13 2330 06/27/13 0100 06/27/13 0135 06/27/13 0208  BP: 131/57 97/81 127/54 161/77  Pulse: 86 78 76 78  Temp:   98.7 F (37.1 C) 98.4 F (36.9 C)  TempSrc:   Rectal Oral  Resp: 25 18 18 18   Height:    4' 10.8" (1.494 m)  Weight:    68.13 kg (150 lb 3.2 oz)  SpO2: 99% 98% 98% 100%     General:  Well-developed and nourished.  Eyes: Anicteric no pallor.  ENT: No discharge from ears eyes nose mouth.  Neck: No mass felt.  Cardiovascular: S1-S2 heard.  Respiratory: No rhonchi or crepitations.  Abdomen: Soft nontender bowel sounds present. No guarding or rigidity.  Skin: No rash.  Musculoskeletal: No edema.  Psychiatric: Appears normal.  Neurologic: Alert awake oriented to time place and person. Moves all extremities.  Labs on Admission:  Basic Metabolic Panel:  Recent Labs Lab 06/26/13 2245  NA 124*  K 5.8*  CL 88*  CO2 19  GLUCOSE 488*  BUN 16  CREATININE 1.09  CALCIUM 9.2   Liver Function Tests:  Recent Labs Lab 06/26/13 2245  AST 47*  ALT 35  ALKPHOS 160*  BILITOT 0.5  PROT 8.4*  ALBUMIN 3.0*   No results found for this basename: LIPASE, AMYLASE,  in the  last 168 hours No results found for this basename: AMMONIA,  in the last 168 hours CBC:  Recent Labs Lab 06/26/13 2245  WBC 11.7*  NEUTROABS 8.1*  HGB 11.8*  HCT 34.2*  MCV 80.1  PLT 266   Cardiac Enzymes: No results found for this basename: CKTOTAL, CKMB, CKMBINDEX, TROPONINI,  in the last 168 hours  BNP (last 3 results) No results found for this basename: PROBNP,  in the last 8760 hours CBG:  Recent Labs Lab 06/25/13 1054 06/27/13 0002 06/27/13 0058 06/27/13 0201  GLUCAP 348* 412* 371* 293*    Radiological Exams on Admission: No results found.  EKG: Independently reviewed. Normal sinus rhythm. Low-voltage.  Assessment/Plan Principal  Problem:   DKA (diabetic ketoacidoses) Active Problems:   HYPERTENSION   UTI (lower urinary tract infection)   HLD (hyperlipidemia)   1. Diabetic ketoacidosis - (early) probably precipitated by UTI. Patient is started on IV insulin infusion on DKA protocol. Continue with aggressive IV rehydration and closely follow metabolic panel to check for closing of anion gap. 2. UTI - continue ceftriaxone and follow urine cultures. 3. Hyponatremia and hyperkalemia - probably from #1 I think it'll get better with correction of patient's dehydration and glucose. 4. Abnormal LFTs - abdomen on exam is benign. Closely follow LFTs. 5. Hypertension - continue present medications. 6. Hyperlipidemia - on statins. 7. Chronic anemia - has had colonoscopy in 2014 which showed tubular adenoma. Further workup as primary and gastroenterologist. Closely follow CBC.    Code Status: Full code.  Family Communication: Family at the bedside.  Disposition Plan: Admit to inpatient.    Rise Patience [facility] Pager [phone]  If 7PM-7AM, please contact night-coverage [email or web address] Password [email or web address] 06/27/2013, 3:05 AM

## 2013-06-27 NOTE — Progress Notes (Signed)
Inpatient Diabetes Program Recommendations  AACE/ADA: New Consensus Statement on Inpatient Glycemic Control (2013)  Target Ranges:  Prepandial:   less than 140 mg/dL      Peak postprandial:   less than 180 mg/dL (1-2 hours)      Critically ill patients:  140 - 180 mg/dL   Reason for Visit: Diabetes Consult  Diabetes history: Type 2 Outpatient Diabetes medications: Lantus 20 units QHS, metformin 1000 mg bid Current orders for Inpatient glycemic control: Lantus 30 QHS, Novolog 4 units tidwc and sensitive correction tidwc HgbA1C - 8.7% - needs tighter control. Pt states she takes her insulin daily and checks blood sugars each morning. Daughter states they range from approx 140 - 180.  In the past week they have been continually increasing. Has close f/u with PCP and takes blood sugar log for MD's review.   GlucoStabilizer started on admission and pt was transitioned to Lantus and Novolog when criteria was met.   Will likely need meal coverage insulin for home. Ordered Living Well With Diabetes book in Spanish for pt. Pt seems to have good understanding of her diabetes.  Will reassess how meal coverage insulin does in the am.  Thank you. Lorenda Peck, RD, LDN, CDE Inpatient Diabetes Coordinator [phone]

## 2013-06-27 NOTE — Progress Notes (Signed)
ANTIBIOTIC CONSULT NOTE - INITIAL  Pharmacy Consult for Ceftriaxone  Indication: UTI  Allergies  Allergen Reactions  . Ace Inhibitors     Hyperkalemia  . Bactrim [Sulfamethoxazole-Trimethoprim]     Hyperkalemia  . Rifampin Other (See Comments)    Severe thrombocytopenia  . Vancomycin Other (See Comments)    Severe thrombocytopenia   Patient Measurements: Height: 4' 10.8" (149.4 cm) Weight: 150 lb 3.2 oz (68.13 kg) IBW/kg (Calculated) : 42.74  Vital Signs: Temp: 98.4 F (36.9 C) (05/04 0208) Temp src: Oral (05/04 0208) BP: 161/77 mmHg (05/04 0208) Pulse Rate: 78 (05/04 0208)  Labs:  Recent Labs  06/26/13 2245  WBC 11.7*  HGB 11.8*  PLT 266  CREATININE 1.09   Estimated Creatinine Clearance: 36.7 ml/min (by C-G formula based on Cr of 1.09).  Medical History: Past Medical History  Diagnosis Date  . Hypertension   . Depression     hx of   . Diabetes mellitus without complication   . Chronic kidney disease     hx of kidney stones,   . Arthritis   . Hearing loss of both ears   . Tinnitus of both ears   . MRSA infection     Oct 13 - Nov 13  . Ganglion, left ankle and foot   . Osteoporosis    Assessment: 79 y/o F to start ceftriaxone for UTI. WBC 11.7, urine culture ordered, abnormal U/A.   Plan:  Ceftriaxone 1g IV q24h Trend WBC, temp F/U urine cx  Narda Bonds 06/27/2013,3:16 AM

## 2013-06-27 NOTE — Progress Notes (Signed)
Received report from RN in ED.

## 2013-06-27 NOTE — Progress Notes (Signed)
Addendum  Patient seen and examined, chart and data base reviewed.  I agree with the above assessment and plan.  For full details please see Mrs. Imogene Burn PA note.  UTI in early DKA. Blood glucose of 488 and anion gap of 17 on admission.  Currently on Rocephin, was on insulin drip which switched to subcutaneous insulin.   Birdie Hopes, MD [facility] Pager: [phone] 06/27/2013, 5:13 PM

## 2013-06-27 NOTE — ED Provider Notes (Signed)
Medical screening examination/treatment/procedure(s) were performed by non-physician practitioner and as supervising physician I was immediately available for consultation/collaboration.  Philipp Deputy, M.D.  Harden Mo, MD 06/27/13 [phone]

## 2013-06-27 NOTE — Progress Notes (Signed)
Pt arrived to unit from ED alert and oriented x4. Pt's daughter at bedside to translate english to [place]. Oriented to room, unit, and staff.  Bed in lowest position and call bell is within reach. Will continue to monitor.

## 2013-06-27 NOTE — Progress Notes (Signed)
Utiliziation review completed 

## 2013-06-27 NOTE — Progress Notes (Signed)
[facility] PROGRESS NOTE   Joyce Gilmore EXB:[id number] DOB: 11-11-1934 DOA: 06/26/2013 PCP: Karis Juba, PA-C  HPI/Subjective: 79 y/o F. SPANISH speaking, with PMH of MRSA infection of her right hip prosthesis in January 2014, HTN, DM, and chronic anemia presented to the 5/3 ED for 4 days of dysuria and frequency which she was seen at the [facility] the day before and given Keflex. While in ED lab work showed she also had DKA. Admitted to manage both issues.  Assessment/Plan: Principal Problem:   DKA (diabetic ketoacidoses) Active Problems:   HYPERTENSION   UTI (lower urinary tract infection)   HLD (hyperlipidemia)   DKA  Multifactorial from concurrent UTI and also poorly managed blood glucose. Venous blood glucose 488, AG of 17 on admission. Acutely managed with insulin drip, transitioned to SQ insulin today. Continue to monitor CBG, check A1c  UTI ED UA +LE, WBC, bacteria, etc. UC from 06/25/13 grew E. Coli.  Sensitivities pending. Continue on Ceftriaxone day-2  Diabetes mellitus Chronic, poorly controlled since 2014, last A1c 8.3 in 03/2013 Clarify and adjust insulin, check A1c Diabetes coordinator to see patient for education.  Hypertension Chronic, controlled Continue home management with Lisinopril  Chronic normocytic anemia Chronic, exacerbated by IVF Monitor with CBC   Code Status: FULL Family Communication: Plan discussed with the family at bedside. Disposition Plan: Remains inpatient.  Likely d/c 5/5.   Antibiotics: Ceftriaxone  Objective: Filed Vitals:   06/27/13 0809  BP: 121/74  Pulse: 69  Temp: 97.6 F (36.4 C)  Resp: 16    Intake/Output Summary (Last 24 hours) at 06/27/13 1332 Last data filed at 06/27/13 1042  Gross per 24 hour  Intake   1050 ml  Output      1 ml  Net   1049 ml   Filed Weights   06/26/13 2011 06/27/13 0208  Weight: 71.668 kg (158 lb) 68.13 kg (150 lb 3.2 oz)    Exam: General: Alert and  awake,not in any acute distress, engaged in conversation in [place] and with family. HEENT: anicteric sclera, PERRL CVS: RRR, no m/r/g Chest: clear to auscultation bilaterally, no wheezing, rales or rhonchi Abdomen: soft nontender, nondistended, normal bowel sounds, no organomegaly Extremities: no cyanosis, clubbing or edema noted bilaterally  Data Reviewed: Basic Metabolic Panel:  Recent Labs Lab 06/26/13 2245 06/27/13 0505 06/27/13 0744 06/27/13 0907  NA 124* 137 136* 132*  K 5.8* 4.0 4.0 4.1  CL 88* 104 104 101  CO2 19 21 20 20   GLUCOSE 488* 133* 218* 229*  BUN 16 12 12 12   CREATININE 1.09 0.94 0.89 0.91  CALCIUM 9.2 7.8* 8.0* 8.1*   Liver Function Tests:  Recent Labs Lab 06/26/13 2245 06/27/13 0744  AST 47* 24  ALT 35 23  ALKPHOS 160* 87  BILITOT 0.5 0.3  PROT 8.4* 6.4  ALBUMIN 3.0* 2.3*   CBC:  Recent Labs Lab 06/26/13 2245 06/27/13 0505  WBC 11.7* 8.3  NEUTROABS 8.1* 4.2  HGB 11.8* 9.5*  HCT 34.2* 28.6*  MCV 80.1 81.0  PLT 266 238   Cardiac Enzymes:  Recent Labs Lab 06/27/13 0505  TROPONINI <0.30   CBG:  Recent Labs Lab 06/27/13 0734 06/27/13 0827 06/27/13 0938 06/27/13 1041 06/27/13 1142  GLUCAP 207* 227* 203* 239* 192*    Micro Recent Results (from the past 240 hour(s))  URINE CULTURE     Status: None   Collection Time    06/25/13 11:30 AM      Result Value Ref  Range Status   Specimen Description URINE, CLEAN CATCH   Final   Special Requests PATIENT ON FOLLOWING [facility]   Final   Culture  Setup Time     Final   Value: 06/25/2013 18:20     Performed at [place]     Final   Value: >=100,000 COLONIES/ML     Performed at [facility]   Culture     Final   Value: ESCHERICHIA COLI     Performed at [facility]   Report Status PENDING   Incomplete  MRSA PCR SCREENING     Status: None   Collection Time    06/27/13  2:09 AM      Result Value Ref Range Status   MRSA by PCR NEGATIVE   NEGATIVE Final   Comment:            The GeneXpert MRSA Assay (FDA     approved for NASAL specimens     only), is one component of a     comprehensive MRSA colonization     surveillance program. It is not     intended to diagnose MRSA     infection nor to guide or     monitor treatment for     MRSA infections.     Studies: No results found.  Scheduled Meds: . aspirin  325 mg Oral Daily  . cefTRIAXone (ROCEPHIN)  IV  1 g Intravenous Q24H  . enoxaparin (LOVENOX) injection  40 mg Subcutaneous Q24H  . insulin aspart  0-9 Units Subcutaneous TID WC  . insulin aspart  4 Units Subcutaneous TID WC  . insulin glargine  30 Units Subcutaneous Daily  . simvastatin  20 mg Oral q1800   Continuous Infusions:       Time spent: 35 minutes    Myrla Halsted PA-S Imogene Burn, [place] [facility] Pager [phone] If 7PM-7AM, please contact night-coverage at [email or web address], password [facility] 06/27/2013, 1:32 PM  LOS: 1 day

## 2013-06-28 LAB — URINE CULTURE: Colony Count: >=100000

## 2013-06-28 LAB — CBC
HCT: 31.2 % — ABNORMAL LOW (ref 36.0–46.0)
Hemoglobin: 10.6 g/dL — ABNORMAL LOW (ref 12.0–15.0)
MCH: 27.5 pg (ref 26.0–34.0)
MCHC: 34 g/dL (ref 30.0–36.0)
MCV: 80.8 fL (ref 78.0–100.0)
PLATELETS: 267 10*3/uL (ref 150–400)
RBC: 3.86 MIL/uL — ABNORMAL LOW (ref 3.87–5.11)
RDW: 14.8 % (ref 11.5–15.5)
WBC: 8.4 10*3/uL (ref 4.0–10.5)

## 2013-06-28 LAB — BASIC METABOLIC PANEL
BUN: 13 mg/dL (ref 6–23)
CALCIUM: 9 mg/dL (ref 8.4–10.5)
CO2: 18 mEq/L — ABNORMAL LOW (ref 19–32)
Chloride: 101 mEq/L (ref 96–112)
Creatinine, Ser: 0.87 mg/dL (ref 0.50–1.10)
GFR calc non Af Amer: 63 mL/min — ABNORMAL LOW (ref >90)
GFR, EST AFRICAN AMERICAN: 73 mL/min — AB (ref >90)
Glucose, Bld: 221 mg/dL — ABNORMAL HIGH (ref 70–99)
Potassium: 4.6 mEq/L (ref 3.7–5.3)
Sodium: 134 mEq/L — ABNORMAL LOW (ref 137–147)

## 2013-06-28 LAB — GLUCOSE, CAPILLARY
GLUCOSE-CAPILLARY: 206 mg/dL — AB (ref 70–99)
Glucose-Capillary: 469 mg/dL — ABNORMAL HIGH (ref 70–99)
Glucose-Capillary: 236 mg/dL — ABNORMAL HIGH (ref 70–99)

## 2013-06-28 MED ORDER — INSULIN ASPART 100 UNIT/ML ~~LOC~~ SOLN: 6.0000 [IU] | Freq: Three times a day (TID) | SUBCUTANEOUS | Status: DC

## 2013-06-28 MED ORDER — INSULIN GLARGINE 100 UNIT/ML ~~LOC~~ SOLN
32.0000 [IU] | Freq: Every day | SUBCUTANEOUS | Status: DC
Start: 2013-06-28 — End: 2013-06-28
  Administered 2013-06-28: 32 [IU] via SUBCUTANEOUS
  Filled 2013-06-28: qty 0.32

## 2013-06-28 MED ORDER — INSULIN GLARGINE 100 UNIT/ML ~~LOC~~ SOLN: 40.0000 [IU] | Freq: Every day | SUBCUTANEOUS | Status: DC

## 2013-06-28 MED ORDER — CEFUROXIME AXETIL 500 MG PO TABS: 500.0000 mg | ORAL_TABLET | Freq: Two times a day (BID) | ORAL | Status: AC

## 2013-06-28 MED ORDER — INSULIN ASPART 100 UNIT/ML ~~LOC~~ SOLN
6.0000 [IU] | Freq: Three times a day (TID) | SUBCUTANEOUS | Status: DC
  Administered 2013-06-28 (×2): 6 [IU] via SUBCUTANEOUS

## 2013-06-28 NOTE — Plan of Care (Signed)
Problem: Food- and Nutrition-Related Knowledge Deficit (NB-1.1) Goal: Nutrition education Formal process to instruct or train a patient/client in a skill or to impart knowledge to help patients/clients voluntarily manage or modify food choices and eating behavior to maintain or improve health. Outcome: Completed/Met Date Met:  06/28/13  Brief Nutrition Note  RD consulted for nutrition education regarding diabetes. RD utilized in-person interpreter [place]. Pt utilizes added sugar in several of her drinks - Koolaid, coffees, teas, etc. We discussed using artificial sweeteners vs elimination of these sugary beverages. Pt verbalized understanding. We also reviewed appropriate serving sizes of fruits.    Lab Results  Component Value Date    HGBA1C 8.7* 06/27/2013    RD provided "Carbohydrate Counting for People with Diabetes" handout from the Academy of Nutrition and Dietetics. Discussed different food groups and their effects on blood sugar, emphasizing carbohydrate-containing foods. Provided list of carbohydrates and recommended serving sizes of common foods.  Discussed importance of controlled and consistent carbohydrate intake throughout the day. Provided examples of ways to balance meals/snacks and encouraged intake of high-fiber, whole grain complex carbohydrates. Teach back method used.  Expect fair compliance.  Body mass index is 30.52 kg/(m^2). Pt meets criteria for Obese Class I based on current BMI.  Current diet order is Carbohydrate Modified Medium, patient is consuming approximately 50-75% of meals at this time. Labs and medications reviewed. No further nutrition interventions warranted at this time. RD contact information provided. If additional nutrition issues arise, please re-consult RD.  Inda Coke MS, RD, LDN Inpatient Registered Dietitian Pager: [phone] After-hours pager: [phone]

## 2013-06-28 NOTE — Discharge Summary (Signed)
Physician Discharge Summary  Joyce Gilmore ZOX:[id number] DOB: Feb 21, 1935 DOA: 06/26/2013  PCP: Karis Juba, PA-C  Admit date: 06/26/2013 Discharge date: 06/28/2013  Time spent: 60 minutes  Recommendations for Outpatient Follow-up:  1. Follow up with PA-C [place], monitor CBG levels with new insulin regimen, adjust as needed 2. Assess for UTI symptom resolution 3. Recheck CBC for anemia   Discharge Diagnoses:  Principal Problem:   DKA (diabetic ketoacidoses) Active Problems:   HYPERTENSION   UTI (lower urinary tract infection)   HLD (hyperlipidemia)   Discharge Condition: Improved, stable Diet recommendation: Carb modified  Filed Weights   06/26/13 2011 06/27/13 0208  Weight: 71.668 kg (158 lb) 68.13 kg (150 lb 3.2 oz)    History of present illness:  79 y/o F. 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  She was also seen at the [facility] the day before and given Keflex. While in ED lab work showed she also had early DKA. Admitted to manage both issues.   Hospital Course:   DKA (diabetic ketoacidoses) Multifactorial from concurrent UTI and also poorly managed blood glucose.  Venous blood glucose 488, AG of 17 on admission. Acutely managed with insulin drip and then SQ insulin.  Dosing was monitored and titrated appropriately. 5/5 am CBG of 206, A1c 8.7% Prior to admission patient was on Lantus and Metformin.  Now Lantus and Novolog (meal time) insulin are prescribed.  Metformin stopped  UTI Resolved ED UA +LE, WBC, bacteria, etc. UC from 06/25/13 grew E. Coli.   Ceftriaxone x2 days, D/c on Ceftin for 5 more days, stop Keflex  Hypertension Chronic, controlled  Continue home management with Lisinopril  Chronic normocytic anemia  Chronic, exacerbated by IVF  Follow up with CBC post d/c   Discharge Exam: Filed  Vitals:   06/28/13 1000  BP: 141/72  Pulse: 82  Temp: 97.8 F (36.6 C)  Resp: 16   General: Alert and awake,not in any acute distress, engaged in conversation in [place] with family and Optometrist.  HEENT: anicteric sclera, PERRL  CVS: RRR, no m/r/g  Chest: clear to auscultation bilaterally, no wheezing, rales or rhonchi  Abdomen: soft nontender, nondistended, normal bowel sounds, no organomegaly  Extremities: no cyanosis, clubbing or edema noted bilaterally GU: denies any urinary symptoms   Discharge Instructions      Discharge Orders   Future Appointments Provider Department Dept Phone   07/13/2013 8:00 AM Orlena Sheldon, PA-C [place] [phone]   Future Orders Complete By Expires   Diet - low sodium heart healthy  As directed    Increase activity slowly  As directed        Medication List    STOP taking these medications       cephALEXin 500 MG capsule  Commonly known as:  KEFLEX     insulin glargine 100 unit/mL Sopn  Commonly known as:  LANTUS  Replaced by:  insulin glargine 100 UNIT/ML injection     metFORMIN 1000 MG tablet  Commonly known as:  GLUCOPHAGE      TAKE these medications       ACCU-CHEK AVIVA PLUS test strip  Generic drug:  glucose blood  USE TO TEST BLOOD SUGAR THREE TIMES DAILY     alendronate 70 MG tablet  Commonly known as:  FOSAMAX  Take 70 mg  by mouth once a week. Take with a full glass of water on an empty stomach. Wednesday.     aspirin 325 MG tablet  Take 325 mg by mouth daily.     calcium-vitamin D 500-200 MG-UNIT per tablet  Commonly known as:  OSCAL WITH D  Take 1 tablet by mouth daily.     cefUROXime 500 MG tablet  Commonly known as:  CEFTIN  Take 1 tablet (500 mg total) by mouth 2 (two) times daily.     insulin aspart 100 UNIT/ML injection  Commonly known as:  novoLOG  Inject 6 Units into the skin 3 (three) times daily with meals.     insulin glargine 100 UNIT/ML injection  Commonly known as:   LANTUS  Inject 0.4 mLs (40 Units total) into the skin daily.     lisinopril 10 MG tablet  Commonly known as:  PRINIVIL,ZESTRIL  Take 1 tablet (10 mg total) by mouth daily.     PEN NEEDLES 31GX5/16" 31G X 8 MM Misc  1 each by Does not apply route daily.     pravastatin 40 MG tablet  Commonly known as:  PRAVACHOL  Take 40 mg by mouth daily.     Vitamin D3 400 UNITS tablet  Take 400 Units by mouth daily.       Allergies  Allergen Reactions  . Ace Inhibitors     Hyperkalemia  . Bactrim [Sulfamethoxazole-Trimethoprim]     Hyperkalemia  . Rifampin Other (See Comments)    Severe thrombocytopenia  . Vancomycin Other (See Comments)    Severe thrombocytopenia   Follow-up Information   Follow up with [facility] [name], PA-C. Call in 1 week. [facility] follow up, Insulin management)    Specialty:  Physician Assistant   Contact information:   [address] [place] [address] [phone]        The results of significant diagnostics from this hospitalization (including imaging, microbiology, ancillary and laboratory) are listed below for reference.    Significant Diagnostic Studies: No results found.  Microbiology: Recent Results (from the past 240 hour(s))  URINE CULTURE     Status: None   Collection Time    06/25/13 11:30 AM      Result Value Ref Range Status   Specimen Description URINE, CLEAN CATCH   Final   Special Requests PATIENT ON FOLLOWING KEFLEX   Final   Culture  Setup Time     Final   Value: 06/25/2013 18:20     Two isolates with different morphologies were identified as the same organism.The most resistant organism was reported.     Performed at [place]     Final   Value: >=100,000 COLONIES/ML     Performed at [facility]   Culture     Final   Value: ESCHERICHIA COLI     Performed at [facility]   Report Status 06/28/2013 FINAL   Final   Organism ID, Bacteria ESCHERICHIA COLI   Final  URINE  CULTURE     Status: None   Collection Time    06/26/13 11:49 PM      Result Value Ref Range Status   Specimen Description URINE, RANDOM   Final   Special Requests NONE   Final   Culture  Setup Time     Final   Value: 06/27/2013 09:55     Performed at [place]     Final  Value: 20,OOO COLONIES/ML     Performed at [facility]     Final   Value: ESCHERICHIA COLI     Performed at [facility]   Report Status PENDING   Incomplete  MRSA PCR SCREENING     Status: None   Collection Time    06/27/13  2:09 AM      Result Value Ref Range Status   MRSA by PCR NEGATIVE  NEGATIVE Final   Comment:            The GeneXpert MRSA Assay (FDA     approved for NASAL specimens     only), is one component of a     comprehensive MRSA colonization     surveillance program. It is not     intended to diagnose MRSA     infection nor to guide or     monitor treatment for     MRSA infections.     Labs: Basic Metabolic Panel:  Recent Labs Lab 06/26/13 2245 06/27/13 0505 06/27/13 0744 06/27/13 0907 06/28/13 0711  NA 124* 137 136* 132* 134*  K 5.8* 4.0 4.0 4.1 4.6  CL 88* 104 104 101 101  CO2 19 21 20 20  18*  GLUCOSE 488* 133* 218* 229* 221*  BUN 16 12 12 12 13   CREATININE 1.09 0.94 0.89 0.91 0.87  CALCIUM 9.2 7.8* 8.0* 8.1* 9.0   Liver Function Tests:  Recent Labs Lab 06/26/13 2245 06/27/13 0744  AST 47* 24  ALT 35 23  ALKPHOS 160* 87  BILITOT 0.5 0.3  PROT 8.4* 6.4  ALBUMIN 3.0* 2.3*   CBC:  Recent Labs Lab 06/26/13 2245 06/27/13 0505 06/28/13 0711  WBC 11.7* 8.3 8.4  NEUTROABS 8.1* 4.2  --   HGB 11.8* 9.5* 10.6*  HCT 34.2* 28.6* 31.2*  MCV 80.1 81.0 80.8  PLT 266 238 267   Cardiac Enzymes:  Recent Labs Lab 06/27/13 0505  TROPONINI <0.30   CBG:  Recent Labs Lab 06/27/13 1142 06/27/13 1707 06/27/13 2057 06/28/13 0746 06/28/13 1202  GLUCAP 192* 360* 267* 206* 236*       Signed:  Myrla Halsted PA-S [place], [place] [phone]  [facility] 06/28/2013, 1:40 PM

## 2013-06-28 NOTE — Care Management Note (Signed)
    Page 1 of 1   06/28/2013     4:49:32 PM CARE MANAGEMENT NOTE 06/28/2013  Patient:  Joyce Gilmore, Joyce Gilmore [facility]   Account Number:  [id number]  Date Initiated:  06/28/2013  Documentation initiated by:  Tomi Bamberger  Subjective/Objective Assessment:   dx dka  admit- from home.     Action/Plan:   Anticipated DC Date:  06/28/2013   Anticipated DC Plan:  HOME/SELF CARE      DC Planning Services  CM consult      Choice offered to / List presented to:             Status of service:  Completed, signed off Medicare Important Message given?   (If response is "NO", the following Medicare IM given date fields will be blank) Date Medicare IM given:   Date Additional Medicare IM given:    Discharge Disposition:  HOME/SELF CARE  Per UR Regulation:  Reviewed for med. necessity/level of care/duration of stay  If discussed at [place] of Stay Meetings, dates discussed:    Comments:

## 2013-06-28 NOTE — Discharge Instructions (Signed)
Control de la glucosa en sangre - Adultos  (Blood Glucose Monitoring, Adult) El control de la glucosa en sangre (tambin llamada azcar en sangre) lo ayudar a controlar su diabetes. Tambin ayuda a que usted y su mdico controlen la diabetes y determinen si el tratamiento est funcionando bien.  POR QU HAY QUE MEDIR LA GLUCOSA DE LA SANGRE?   Esto ayuda a comprender de [employer], la actividad fsica y los medicamentos inciden en los niveles de [place].  Le permite conocer su nivel de glucosa en sangre, en cualquier momento. Usted puede saber rpidamente si tiene un nivel bajo (hipoglucemia) o un nivel elevado de glucosa en la sangre (hiperglucemia).  Ayuda a que usted y su mdico sepan como ajustar los medicamentos.  Puede ayudarlo a comprender Social research officer, government una enfermedad o a ajustar los medicamentos con respecto a la actividad fsica. CUNDO DEBE [place]?  El mdico lo ayudar a decidir con qu frecuencia deber Special educational needs teacher. Esto puede depender del tipo de diabetes que tenga, su control de la diabetes o los tipos de medicamentos que tome. Asegrese de anotar todos los valores de la glucosa en [place], de modo que esta informacin pueda ser revisada por su mdico. Vea ejemplos ms abajo de los momentos para Optometrist la prueba que su mdico puede indicar.  Diabetes tipo 1:  Haga la prueba 4 veces por da si est bien controlado, usando una bomba de insulina o aplicando mltiples inyecciones diarias.  Si la diabetes no est bien controlada o si est enfermo, puede ser necesario que se controle con ms frecuencia.  Es una buena idea tambin controlarse:  Antes de Engineer, site.  [place] comidas y 2 horas despus de las comidas.  Ocasionalmente, entre las 2:00 y 3:00 am. Diabetes tipo 2  Puede variar con cada persona, pero en general, si recibe insulina, hgase la prueba 4 veces por da.  Si toma medicamentos por boca (va oral), hgase  la prueba 2 veces por da.  Si sigue una dieta controlada, hgase la prueba una vez por da.  Si la diabetes no est bien controlada o si est enfermo, puede ser necesario que se controle con ms frecuencia. [place] EN SANGRE  Materiales necesarios  Un medidor de Multimedia programmer.  Tiras reactivas para el medidor. Cada medidor tiene sus propias tiras reactivas. Cassia tiras reactivas correspondientes a su medidor.  Una aguja para pinchar (lanceta).  Un dispositivo que sostenga la lanceta (dispositivo de puncin).  Un diario o libro de anotaciones para [employer]. Procedimiento  Lave sus manos con agua y [place]. No se recomienda usar alcohol.  Pinche un lado de su dedo (no la punta) con Retail buyer.  Apriete suavemente el dedo hasta que aparezca una pequea gota de [place].  Siga las instrucciones que vienen con el medidor para Garment/textile technologist tira Comptroller, para Midwife la sangre sobre la tira y para el uso del medidor de Multimedia programmer. Otras zonas de las que puede tomar sangre Algunos medidores le permiten tomar sangre para la prueba en otras zonas del cuerpo (que no son el dedo). Estas reas se llaman sitios alternativos. Los sitios alternativos ms habituales son:   El antebrazo.  El muslo.  La zona posterior de la pierna.  La palma de la mano. El flujo de sangre en estas zonas es ms lento. Por lo tanto, los valores de glucosa en sangre que obtenga pueden estar  demorados, y los nmeros son diferentes de los que obtiene de los dedos. Nosaque sangre en sitios alternativos si cree que tiene hipoglucemia. Los valores no sern precisos. Siempre extraiga del dedo si tiene hipoglucemia. Adems, si no puede darse cuenta cuando est con un nivel bajo (hipoglucemia asintomtica), siempre use los dedos para realizar la prueba de Multimedia programmer.  CONSEJOS ADICIONALES PARA EL CONTROL DE LA GLUCOSA   No vuelva a [place]  lancetas.  Siempre tenga los elementos a mano.  Todos los medidores de glucosa tienen un nmero de telfono "directo" al que podr [employer] 24 horas si tiene dudas o [place].  Ajuste (calibre) el medidor de glucosa con una solucin de control despus de finalizar algunas cajas de tiras reactivas. LLEVE REGISTROS DE LA GLUCOSA EN SANGRE  Es una buena idea llevar un diario o registro de los valores de glucosa en [place]. La [employer] de los medidores de glucosa, sino todos, conservan el registro de la glucosa en el dispositivo. Algunos medidores permiten descargar los registros a su computadora. Llevar un registro de los valores de glucosa en sangre es especialmente til si desea observar los patrones. Haga anotaciones simultneas con la Teacher, English as a foreign language de los valores de glucosa en sangre debido a que podra olvidar lo que ocurri en el momento exacto. Llevar un buen registro lo ayudar a usted y a su mdico a trabajar juntos para Scientist, forensic un buen control de la diabetes.  Document Released: 02/10/2005 Document Revised: 10/13/2012 [facility] Patient Information 2014 [place], [place].  Gua de planeamiento de la alimentacin para diabticos (Diabetes Meal Planning Guide) La gua de planeamiento de alimentacin para diabticos es una herramienta para ayudarlo a planear sus comidas y colaciones. Es importante para las personas con diabetes controlar sus niveles de Location manager. Elegir los [employer] correctos y las cantidades adecuadas durante el da le ayudar a Media planner. Comer bien puede incluso ayudarlo a mejorar la presin sangunea y Science writer o Theatre manager un peso saludable. CUENTE LOS HIDRATOS DE CARBONO CON FACILIDAD Cuando consume hidratos de carbono, stos se transforman en azcar (glucosa). Esto a su vez Agricultural consultant de Museum/gallery exhibitions officer. El conteo de carbohidratos puede ayudarlo a Chief Technology Officer este nivel para que se sienta mejor. Al planear sus alimentos con el conteo de carbohidratos, podr  tener ms flexibilidad en lo que come y Curator con el consumo de alimentos. El conteo de carbohidratos significa simplemente sumar la cantidad total de gramos de carbohidratos a sus comidas o colaciones. Trate de consumir la misma cantidad en cada comida. A continuacin encontrar una lista de 1 porcin o 15 gr. de carbohidratos. A continuacin se enumeran. Pregunte al mdico cuntos gramos de carbohidratos necesita comer en cada comida o colacin. Almidones y granos  1 [place] de pan.   bollo ingls o bollo para hamburguesa o hotdog.   taza de cereal fro (sin azcar).   taza de pasta o arroz cocido.   taza de vegetales que contengan almidn (maz, papas, arvejas, porotos, calabaza).  1 omelette (6 pulgadas).   bollo.  1 waffle o panqueque (del tamao de un CD).   taza de cereales cocidos.  4 a 6 galletas saldas pequeas. *Se recomienda el consumo de granos enteros. Frutas  1 taza de frutos rojos, meln, papaya o anan sin azcar.  1 fruta fresca pequea.   banana o mango.   taza de jugo de frutas (4 onzas sin endulzar).   taza de fruta envasada en jugo natural o  agua.  2 cucharadas de frutas secas.  12-15 uvas o cerezas. Leche y yogurt  1 taza de [employer] o al 1%.  [place].  6 onzas de yogurt descremado con edulcorante sin azcar.  6 onzas de yogur descremado de soja.  6 onzas de yogur natural. Vegetales  1 taza de vegetales crudos o  de vegetales cocidos se considera cero carbohidratos o una comida "libre".  Si come 3 o ms porciones en una comida, cuntelas como 1 porcin de carbohidratos. Otros carbohidratos   onzas de chips o pretzels.   taza de helado de crema o yogur helado.   taza de helado de agua.  5 cm de torta no congelada.  1 cucharada de miel, azcar, mermelada, jalea o almbar.  2 galletitas dulces pequeas.  3 cuadrados de crackers de graham.  3 tazas de palomitas de  maz.  6 crackers.  1 taza de caldo.  Cuente 1 taza de guisado u otra mezcla de alimentos como 2 porciones de carbohidratos.  Los alimentos con menos de 20 caloras por porcin deben contarse como cero carbohidratos o alimento "libre". Si lo desea compre un libro o software de computacin que enumere la cantidad de gramos de carbohidratos de los diferentes alimentos. Adems, el panel nutricional en las etiquetas de los productos que consume es una buena fuente de informacin. Le indicar el tamao de la porcin y la cantidad total de carbohidratos que consumir por cada una. Divida este nmero por 15 para obtener el nmero de conteo de carbohidratos por porcin. Recuerde: cada porcin son 59 gramos de carbohidratos. PORCIONES La medicin de los alimentos y el tamao de las porciones lo ayudarn a Aeronautical engineer cantidad exacta de comida que debe ingerir. La lista que sigue le mostrar el tamao de algunas porciones comunes.   1 onza.................4 dados apilados.  3 onzas..............Marland KitchenUn mazo de cartas.  1 cucharadita...Marland KitchenMarland KitchenLa punta de un dedo pequeo.  1 cucharada.......Marland KitchenUn dedo.  2 cucharadas....Marland KitchenMarland KitchenUna pelota de golf.   taza..............Marland KitchenLa mitad de un puo.  1 taza...............Marland KitchenUn puo. EJEMPLO DE PLAN DE ALIMENTACIN PARA DIABTICOS: A continuacin se muestra un ejemplo de plan de alimentacin que incluye comidas de los grupos de granos y [place], Photographer, frutas y carnes. Un nutricionista podr confeccionarle un plan individualizado para cubrir sus necesidades calricas y decirle el nmero de porciones que necesita de [place]. Sin embargo, podra [employer] Devices alimentos que contengan carbohidratos (lcteos, cereales y frutas). Controlar la cantidad total de carbohidratos en los alimentos o colaciones es ms importante que asegurarse de incluir todos los grupos alimenticios cada vez que come.  El siguiente plan de alimentacin es un ejemplo de una dieta de 2000 caloras  mediante el conteo de carbohidratos. Este plan contiene 17 porciones de carbohidratos. Desayuno  1 taza de avena (2 porciones de carbohidratos).   taza de yogur light(1 porcin de carbohidratos).  1 taza de arndanos (1 porcin de carbohidratos).   taza de almendras. Colacin  1 manzana grande (2 porciones de carbohidratos).  1 palito de queso bajo [employer]. Almuerzo  Ensalada de pechuga de pollo.  1 taza de espinacas.   taza de tomates cortados.  2 oz (60 gr) de pechuga de pollo en rebanadas.  2 cucharadas de aderezo italiano bajo en [employer].  12 galletas integrales (2 porciones de carbohidratos).  12 a 15 uvas (1 porcin de carbohidratos).  [place] (1porcin de carbohidratos). Colacin  1 taza de zanahorias.   taza de  pur de garbanzos (1 porcin de carbohidratos). Cena  3 oz (80 gr) de salmn a la parrilla.  1 taza de arroz integral (3 porciones de carbohidratos). Colacin  1  taza de brcoli al vapor (1 porcin de carbohidrato) con una cucharadita de aceite de oliva y jugo de limn.  1 taza de budn light (2 porciones de carbohidratos). HOJA DE PLANEAMIENTO DE LA ALIMENTACIN: El dietista podr utilizar esta hoja para ayudarlo a decidir cuntas porciones y qu tipos de alimentos son los adecuados para usted.  DESAYUNO Grupo de alimentos y porciones / Alimento elegido Granos/Fculas_________________________________________________ Lcteos________________________________________________________ Charlann Lange ______________________________________________________ Donalee Citrin __________________________________________________________ Gordy Clement _________________________________________________________ Daphene Jaeger _________________________________________________________ Renato Gails de alimentos y porciones / Alimento  elegido Granos/Fculas___________________________________________________ Lcteos_________________________________________________________ Donalee Citrin ___________________________________________________________ Gordy Clement __________________________________________________________ Daphene Jaeger __________________________________________________________ [place] Nickel de alimentos y porciones / Alimento elegido Granos/Fculas___________________________________________________ Lcteos_________________________________________________________ Donalee Citrin ___________________________________________________________ Gordy Clement __________________________________________________________ Daphene Jaeger __________________________________________________________ [place] de alimentos y porciones / Alimento elegido Granos/Fculas_________________________________________________ Lcteos________________________________________________________ Charlann Lange ______________________________________________________ Donalee Citrin _________________________________________________________ Gordy Clement ________________________________________________________ Daphene Jaeger ________________________________________________________ Elsworth Soho DIARIOS Fculas_______________________________________________________ [id number] _____________________________________________________ Donalee Citrin ________________________________________________________ Lcteos_______________________________________________________ [place]________________________________________________________ Daphene Jaeger ________________________________________________________ Document Released: 05/20/2007 Document Revised: 05/05/2011 ExitCare Patient Information 2014 [place] [place].  Diabetes Mellitus Tipo 2 - Adultos  (Type 2 Diabetes Mellitus, Adult)  La diabetes mellitus tipo 2 es una enfermedad de larga duracin (crnica). En la diabetes tipo 2:  El pncreas no produce la cantidad suficiente de una hormona llamada  insulina.  Las clulas del cuerpo no responden a la [employer] se produce.  Pueden ocurrir ambas situaciones. Normalmente, la [employer] Corporation azcares de los alimentos a las clulas de los tejidos. Esto le proporciona la energa. Si usted tiene diabetes tipo 2, los azcares no pueden ser movidos dentro de las clulas del tejido. Esto produce un aumento del nivel de Dispensing optician (hiperglucemia).  CUIDADOS EN EL HOGAR   Haga que el nivel de hemoglobina A1c sea verificado dos veces al ao. Este nivel muestra si la diabetes est controlada o est fuera de control.  Realice las pruebas de Location manager en la sangre todos los das segn como indique el mdico.  Revise sus niveles de cetonas haciendo una prueba en el pis (orina) cuando est enfermo y segn como le hayan indicado.  [employer] medicamentos para la diabetes o para la insulina tal como se los prescribi el mdico.  [place] se quede sin insulina.  Ajuste la cantidad de insulina en base de la cantidad de carbohidratos (hidratos de carbono) que come. Los hidratos de carbono se encuentran en muchos alimentos, tales como frutas, verduras, granos integrales y productos lcteos.  Tome una colacin sana entre cada comida saludable. Debe consumir 3 comidas y 3 colaciones diarias.  Baje de peso si es necesario.  Lleve una tarjeta de alerta mdica o use una pulsera o medalla de alerta mdica.  Lleve 15 gramos de hidratos de carbono para la merienda en todo momento. Por ejemplo:  Pastillas de glucosa, 3 o 4.  Gel de glucosa, tubo de 15 gramos.  Pasas de uva, 2 cucharadas (24 gramos).  Caramelos de goma, 6.  Galletas de [place], 8.  Gaseosa con azcar, 4 onzas (120 mililitros).  Pastillas de goma, 9.  Note los sntomas de niveles bajos de Museum/gallery exhibitions officer (hipoglucemia) como:  Sacudidas (temblores).  Disminucin de capacidad para pensar con claridad.  Sudoracin.  Aumento en el ritmo cardaco.  Dolor de cabeza.  [employer].  Hambre.  Malhumor (irritabilidad).  Preocupacin o tensin (ansiedad).  Sueo agitado.  Cambios en el habla o en la coordinacin.  Confusin.  Controle  la hipoglucemia inmediatamente. Si usted est alerta y puede tragar, siga la regla de 15:15:  Tome de 15 a 20 gramos de glucosa o hidratos de carbono de accin rpida. Por ejemplo un gel de glucosa, pastillas de glucosa, o 4 onzas (120 mililitros) de jugo de frutas, soda regular, o leche baja en grasa.  Compruebe su nivel de azcar en sangre despus de tomar la glucosa.  Tome de 15 a 20 gramos ms de la glucosa si al repetir el [employer]ant nivel de glucosa en la sangre todava es de 70 mg/dl (miligramos/decilitro) o inferior.  Coma una comida o un aperitivo 1 hora despus de [facility] niveles de Dispensing optician.  Note los primeros sntomas de niveles elevados de Museum/gallery exhibitions officer, tales como:  Tiene mucha sed o bebe mucho (polidipsia).  Hace pis (orina) mucho (poliuria).  Haga por lo menos 150 minutos de actividad fsica a la semana o segn como lo indique.  Divida los 150 minutos de actividad durante la semana. No haga los 150 minutos de Lexicographer.  Realice ejercicios como levantar pesas, por lo menos 2 veces a la semana o segn [employer] indiquen.  Ajuste la insulina o la ingesta de alimentos, segn sea necesario, si inicia un nuevo ejercicio o deporte.  Siga su plan de das de enfermo cuando no puede comer o beber como es de [place].  Evite el consumo de [place] que no estn embarazadas no deben beber ms de 1 medidas al [employer]. Los hombres no deben beber ms de 2 medidas por Training and development officer.  Slo beba alcohol junto con la comida.  Pregntele a su mdico si el alcohol es seguro para usted.  Dgale a su mdico si usted bebe alcohol varias veces durante la semana.  Consulte al mdico con regularidad.  Programe un examen ocular despus de que le hayan diagnosticado diabetes. Programe los  exmenes una vez al ao.  Revise la piel y [employer]. Examine si hay cortes, moretones, enrojecimiento, problemas en las uas, sangrado, ampollas o llagas. El Teacher, adult education un examen de los pies una vez al ao.  [place] los dientes y [place]. Use el hilo dental una vez al da. Visite a su dentista regularmente.  Comparta su plan de control de diabetes con su trabajo o con la escuela.  [place] (vacunas).  Aprenda a Engineer, maintenance (IT).  Reciba educacin para la diabetes cuando sea necesario.  Pregunte a su mdico en busca de ayuda especial si:  Necesita ayuda para mantener o mejorar la forma de hacer cosas por su cuenta.  Necesita ayuda para mantener o mejorar la calidad de vida.  Tiene problemas en el pie o en la mano.  Tiene problemas de baarse, vestirse, comer o hacer actividad fsica. SOLICITE AYUDA DE INMEDIATO SI:   Tiene dificultad para respirar.  Tiene niveles de cetonas moderados a altos.  No puede comer alimentos o beber por ms de 6 horas.  Tiene ganas de vomitar (nuseas) o (vomita) y esto dura ms de 1 hora.  Su nivel de azcar en sangre es mayor de 240 mg/dl.  Hay algn cambio en su estado mental.  Tiene otra enfermedad grave.  Tiene heces lquidos (diarrea) durante ms de 6 horas.  Ha estado enfermo o ha tenido fiebre por dos o ms das y no se Building surveyor.  Tiene dolor cuando est fsicamente  activo. ASEGRESE DE QUE:   Comprende estas instrucciones.  Controlar su enfermedad.  Solicitar ayuda de inmediato si no mejora o si empeora. Document Released: 05/09/2008 Document Revised: 11/05/2011 [facility] Patient Information 2014 [place], [place].

## 2013-06-29 LAB — URINE CULTURE

## 2013-06-29 NOTE — Discharge Summary (Signed)
Addendum  Patient seen and examined, chart and data base reviewed.  I agree with the above assessment and plan.  For full details please see Mrs. Imogene Burn PA note.  UTI and early DKA, treated with insulin drip, Insulin dose adjusted on D/C. Ceftin on D/C.   Birdie Hopes, MD [facility] Pager: [phone] 06/29/2013, 11:17 AM

## 2013-06-29 NOTE — ED Notes (Signed)
Urine culture: >100,000 colonies E. Coli.  Pt. transferred to ED and got IV Rocephin and Rx. of Ceftin.  Lab results shown to Janne Napoleon NP.  Hanley Seamen Valery Amedee 06/29/2013

## 2013-07-06 ENCOUNTER — Encounter: Payer: Self-pay | Admitting: Physician Assistant

## 2013-07-06 ENCOUNTER — Other Ambulatory Visit: Payer: Self-pay | Admitting: Family Medicine

## 2013-07-06 ENCOUNTER — Ambulatory Visit (INDEPENDENT_AMBULATORY_CARE_PROVIDER_SITE_OTHER): Payer: Medicaid Other | Admitting: Physician Assistant

## 2013-07-06 VITALS — BP 136/70 | HR 80 | Temp 97.1°F | Resp 18 | Wt 145.0 lb

## 2013-07-06 DIAGNOSIS — E1165 Type 2 diabetes mellitus with hyperglycemia: Principal | ICD-10-CM

## 2013-07-06 DIAGNOSIS — E119 Type 2 diabetes mellitus without complications: Secondary | ICD-10-CM

## 2013-07-06 DIAGNOSIS — Z09 Encounter for follow-up examination after completed treatment for conditions other than malignant neoplasm: Secondary | ICD-10-CM

## 2013-07-06 DIAGNOSIS — IMO0001 Reserved for inherently not codable concepts without codable children: Secondary | ICD-10-CM

## 2013-07-06 DIAGNOSIS — D649 Anemia, unspecified: Secondary | ICD-10-CM

## 2013-07-06 LAB — CBC WITH DIFFERENTIAL/PLATELET
Basophils Absolute: 0.1 10*3/uL (ref 0.0–0.1)
Basophils Relative: 1 % (ref 0–1)
EOS ABS: 0.2 10*3/uL (ref 0.0–0.7)
Eosinophils Relative: 2 % (ref 0–5)
HCT: 36 % (ref 36.0–46.0)
Hemoglobin: 12.1 g/dL (ref 12.0–15.0)
LYMPHS PCT: 29 % (ref 12–46)
Lymphs Abs: 2.3 10*3/uL (ref 0.7–4.0)
MCH: 27.6 pg (ref 26.0–34.0)
MCHC: 33.6 g/dL (ref 30.0–36.0)
MCV: 82 fL (ref 78.0–100.0)
Monocytes Absolute: 0.6 10*3/uL (ref 0.1–1.0)
Monocytes Relative: 7 % (ref 3–12)
Neutro Abs: 4.8 10*3/uL (ref 1.7–7.7)
Neutrophils Relative %: 61 % (ref 43–77)
Platelets: 311 10*3/uL (ref 150–400)
RBC: 4.39 MIL/uL (ref 3.87–5.11)
RDW: 15.7 % — ABNORMAL HIGH (ref 11.5–15.5)
WBC: 7.9 10*3/uL (ref 4.0–10.5)

## 2013-07-06 MED ORDER — ACCU-CHEK SOFTCLIX LANCETS MISC: Status: DC

## 2013-07-06 MED ORDER — INSULIN ASPART 100 UNIT/ML FLEXPEN: PEN_INJECTOR | SUBCUTANEOUS | Status: DC

## 2013-07-06 MED ORDER — "PEN NEEDLES 5/16"" 31G X 8 MM MISC": 1.0000 | Freq: Three times a day (TID) | Status: DC

## 2013-07-06 NOTE — Progress Notes (Signed)
Patient ID: Joyce Gilmore MRN: [id number], DOB: 01/05/1935, 79 y.o. Date of Encounter: @DATE @  Chief Complaint:  Chief Complaint  Patient presents with  . hosp follow up    HPI: 79 y.o. 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 She is here for followup of recent hospitalization. If reviewed in the hospital discharge summary. She was admitted 06/26/13 and discharged 06/28/13. She was admitted with urinary tract infection and uncontrolled hyperglycemia or/diabetes. UTI:  She was treated with ceftriaxone for 2 days. Discharged on Ceftin for 5 more days. DM:  In the hospital metformin was stopped. Lantus dose was increased up to 40 units. As well Novalog was added --to use 6 units 3 times daily with meals. Anemia: During the hospitalization she did have some anemia which was felt to be secondary to hydration of IV fluids. Discharge summary recommended for me to recheck CBC to follow this up.  Patient states that prior to going to the hospital she was having dysuria and was having to urinate constantly. says that both of these have resolved. Also she did complete all of the antibiotics as directed.  Her daughter does have blood sugar log with her. Daughter states that since being home from the hospital they have been giving the Lantus at 40 units every night as directed. Daughter has given her  the 6 units of Novolog 3 times daily with meals. The only times that she has not given the Novolog has been for the past 3 mornings. The past 3 mornings she has gotten lower glucose readings so she did not give the morning dose of NovoLog.  Blood sugar log readings are as follows: Fasting morning readings are 210 305 263 174 130 89 84 76  Readings before lunch are: 298 298 175 204 164 146 181  Readings before dinner are from the past  4 days. Prior to that they were not checking it at that time. 177 256 208 132  They only did  bedtime readings on the first 2 days that she was having from the hospital.  Those readings are 359   And    174    Past Medical History  Diagnosis Date  . Hypertension   . Depression     hx of   . Diabetes mellitus without complication   . Chronic kidney disease     hx of kidney stones,   . Arthritis   . Hearing loss of both ears   . Tinnitus of both ears   . MRSA infection     Oct 13 - Nov 13  . Ganglion, left ankle and foot   . Osteoporosis      Home Meds: See attached medication section for current medication list. Any medications entered into computer today will not appear on this note's list. The medications listed below were entered prior to today. Current Outpatient Prescriptions on File Prior to Visit  Medication Sig Dispense Refill  . ACCU-CHEK AVIVA PLUS test strip USE TO TEST BLOOD SUGAR THREE TIMES DAILY  100 each  5  . alendronate (FOSAMAX) 70 MG tablet Take 70 mg by mouth once a week. Take with a full glass of water on an empty stomach. Wednesday.      Marland Kitchen aspirin 325 MG tablet Take 325 mg by mouth daily.      . calcium-vitamin D (OSCAL WITH D) 500-200 MG-UNIT per tablet Take 1 tablet by mouth daily.      Marland Kitchen  cefUROXime (CEFTIN) 500 MG tablet Take 1 tablet (500 mg total) by mouth 2 (two) times daily.  10 tablet  0  . Cholecalciferol (VITAMIN D3) 400 UNITS tablet Take 400 Units by mouth daily.      . insulin aspart (NOVOLOG) 100 UNIT/ML injection Inject 6 Units into the skin 3 (three) times daily with meals.  10 mL  11  . insulin glargine (LANTUS) 100 UNIT/ML injection Inject 0.4 mLs (40 Units total) into the skin daily.  10 mL  11  . Insulin Pen Needle (PEN NEEDLES 31GX5/16") 31G X 8 MM MISC 1 each by Does not apply route daily.  30 each  11  . lisinopril (PRINIVIL,ZESTRIL) 10 MG tablet Take 1 tablet (10 mg total) by mouth daily.  90 tablet  3  . pravastatin (PRAVACHOL) 40 MG tablet Take 40 mg by mouth daily.       No current facility-administered medications on file  prior to visit.    Allergies:  Allergies  Allergen Reactions  . Ace Inhibitors     Hyperkalemia  . Bactrim [Sulfamethoxazole-Trimethoprim]     Hyperkalemia  . Rifampin Other (See Comments)    Severe thrombocytopenia  . Vancomycin Other (See Comments)    Severe thrombocytopenia    History   Social History  . Marital Status: Widowed    Spouse Name: N/A    Number of Children: N/A  . Years of Education: N/A   Occupational History  . Not on file.   Social History Main Topics  . Smoking status: Never Smoker   . Smokeless tobacco: Never Used  . Alcohol Use: No  . Drug Use: No  . Sexual Activity: No   Other Topics Concern  . Not on file   Social History Narrative   ** Merged History Encounter **        Family History  Problem Relation Age of Onset  . Colon cancer Neg Hx   . Hypertension Mother      Review of Systems:  See HPI for pertinent ROS. All other ROS negative.    Physical Exam: Blood pressure 136/70, pulse 80, temperature 97.1 F (36.2 C), temperature source Oral, resp. rate 18, weight 145 lb (65.772 kg)., Body mass index is 29.47 kg/(m^2). General: Appears in no acute distress. Neck: Supple. No thyromegaly. No lymphadenopathy. Lungs: Clear bilaterally to auscultation without wheezes, rales, or rhonchi. Breathing is unlabored. Heart: RRR with S1 S2. No murmurs, rubs, or gallops. Musculoskeletal:  Strength and tone normal for age. Extremities/Skin: Warm and dry. Neuro: Alert and oriented X 3. Moves all extremities spontaneously. Gait is normal. CNII-XII grossly in tact. Psych:  Responds to questions appropriately with a normal affect.     ASSESSMENT AND PLAN:  79 y.o. year old female with  1. Hospital discharge follow-up  1. UTI: She has completed course of therapy for this. She is now having no symptoms of UTI and has no fever.  2. DIABETES MELLITUS, TYPE II Starting night, she will decrease her nightly dose of Lantus down to 36 units at  night. Will monitor her blood sugars on this amount. Told the daughter to continue doing what she is regarding the mealtime insulin. If sugar is low then hold the mealtime insulin. If not then continue doing the 6 units at meals. Call us to follow up if getting fasting morning sugars less than 120.  3. Anemia - CBC with Differential  SHe already has a 3 month followup office visit scheduled  with me for  07/13/13. She will keep that appointment. F/ Up with [place] sooner if needed.   [address] [place], [place], [facility] 07/06/2013 12:18 PM

## 2013-07-06 NOTE — Telephone Encounter (Signed)
rx faxed to pharmacy

## 2013-07-13 ENCOUNTER — Ambulatory Visit (INDEPENDENT_AMBULATORY_CARE_PROVIDER_SITE_OTHER): Payer: Medicaid Other | Admitting: Physician Assistant

## 2013-07-13 ENCOUNTER — Encounter: Payer: Self-pay | Admitting: Physician Assistant

## 2013-07-13 VITALS — BP 142/70 | HR 60 | Temp 97.2°F | Resp 18 | Wt 147.0 lb

## 2013-07-13 DIAGNOSIS — E785 Hyperlipidemia, unspecified: Secondary | ICD-10-CM

## 2013-07-13 DIAGNOSIS — M810 Age-related osteoporosis without current pathological fracture: Secondary | ICD-10-CM

## 2013-07-13 DIAGNOSIS — E871 Hypo-osmolality and hyponatremia: Secondary | ICD-10-CM

## 2013-07-13 DIAGNOSIS — D649 Anemia, unspecified: Secondary | ICD-10-CM

## 2013-07-13 DIAGNOSIS — Z23 Encounter for immunization: Secondary | ICD-10-CM

## 2013-07-13 DIAGNOSIS — E875 Hyperkalemia: Secondary | ICD-10-CM

## 2013-07-13 DIAGNOSIS — E119 Type 2 diabetes mellitus without complications: Secondary | ICD-10-CM

## 2013-07-13 DIAGNOSIS — I10 Essential (primary) hypertension: Secondary | ICD-10-CM

## 2013-07-13 LAB — HEMOGLOBIN A1C, FINGERSTICK: Hgb A1C (fingerstick): 7.8 % — ABNORMAL HIGH (ref <5.7)

## 2013-07-13 MED ORDER — PRAVASTATIN SODIUM 20 MG PO TABS: 20.0000 mg | ORAL_TABLET | Freq: Every day | ORAL | Status: DC

## 2013-07-13 NOTE — Progress Notes (Signed)
Patient ID: Joyce Gilmore MRN: [id number], DOB: Sep 22, 1934, 79 y.o. Date of Encounter: @DATE @  Chief Complaint:  Chief Complaint  Patient presents with  . 3 mth check up    is fasting    HPI: 79 y.o. year old female  presents with a different family member than is usually here. It is a female family member and she can translate as the patient does not speak much [place].  She states that Ms. Duwayne Heck has no questions or concerns except as wondering whether she could stop the mealtime insulins.  They did bring in blood sugar log sheet. Finally she is doing Lantus 36 units at night. He is getting 6 units of short-acting insulin 3 times a day at mealtimes.  Fasting morning readings are: 76 81 104 132 205 105 82  Readings before lunch are: 163 200 89 83 203 189 202  Readings before dinner are: 219 200 133 92 116 164 184  Taking the pravastatin as directed. Having no myalgias or other adverse effects. Taking all of the medicines as directed.   Past Medical History  Diagnosis Date  . Hypertension   . Depression     hx of   . Diabetes mellitus without complication   . Chronic kidney disease     hx of kidney stones,   . Arthritis   . Hearing loss of both ears   . Tinnitus of both ears   . MRSA infection     Oct 13 - Nov 13  . Ganglion, left ankle and foot   . Osteoporosis      Home Meds: See attached medication section for current medication list. Any medications entered into computer today will not appear on this note's list. The medications listed below were entered prior to today. Current Outpatient Prescriptions on File Prior to Visit  Medication Sig Dispense Refill  . ACCU-CHEK AVIVA PLUS test strip USE TO TEST BLOOD SUGAR THREE TIMES DAILY  100 each  5  . ACCU-CHEK SOFTCLIX LANCETS lancets Insulin dependent diabetic on sliding scale insulin   ICD 250.02  200 each  11  . alendronate (FOSAMAX) 70 MG tablet Take 70 mg by mouth once a  week. Take with a full glass of water on an empty stomach. Wednesday.      Marland Kitchen aspirin 325 MG tablet Take 325 mg by mouth daily.      . calcium-vitamin D (OSCAL WITH D) 500-200 MG-UNIT per tablet Take 1 tablet by mouth daily.      . Cholecalciferol (VITAMIN D3) 400 UNITS tablet Take 400 Units by mouth daily.      . insulin aspart (NOVOLOG) 100 UNIT/ML FlexPen Follow sliding scale as ordered  15 mL  5  . insulin glargine (LANTUS) 100 UNIT/ML injection Inject 0.4 mLs (40 Units total) into the skin daily.  10 mL  11  . Insulin Pen Needle (PEN NEEDLES 31GX5/16") 31G X 8 MM MISC 1 each by Does not apply route 4 (four) times daily -  before meals and at bedtime.  150 each  11  . lisinopril (PRINIVIL,ZESTRIL) 10 MG tablet Take 1 tablet (10 mg total) by mouth daily.  90 tablet  3  . pravastatin (PRAVACHOL) 40 MG tablet Take 40 mg by mouth daily.       No current facility-administered medications on file prior to visit.    Allergies:  Allergies  Allergen Reactions  . Ace Inhibitors     Hyperkalemia  . Bactrim [Sulfamethoxazole-Trimethoprim]  Hyperkalemia  . Rifampin Other (See Comments)    Severe thrombocytopenia  . Vancomycin Other (See Comments)    Severe thrombocytopenia    History   Social History  . Marital Status: Widowed    Spouse Name: N/A    Number of Children: N/A  . Years of Education: N/A   Occupational History  . Not on file.   Social History Main Topics  . Smoking status: Never Smoker   . Smokeless tobacco: Never Used  . Alcohol Use: No  . Drug Use: No  . Sexual Activity: No   Other Topics Concern  . Not on file   Social History Narrative   ** Merged History Encounter **        Family History  Problem Relation Age of Onset  . Colon cancer Neg Hx   . Hypertension Mother      Review of Systems:  See HPI for pertinent ROS. All other ROS negative.    Physical Exam: Blood pressure 142/70, pulse 60, temperature 97.2 F (36.2 C), temperature source Oral,  resp. rate 18, weight 147 lb (66.679 kg)., Body mass index is 29.87 kg/(m^2). General: WNWD Hispanic Female. Appears in no acute distress. Neck: Supple. No thyromegaly. No lymphadenopathy. No carotid bruit. Lungs: Clear bilaterally to auscultation without wheezes, rales, or rhonchi. Breathing is unlabored. Heart: RRR with S1 S2. No murmurs, rubs, or gallops. Abdomen: Soft, non-tender, non-distended with normoactive bowel sounds. No hepatomegaly. No rebound/guarding. No obvious abdominal masses. Musculoskeletal:  Strength and tone normal for age. Extremities/Skin: Warm and dry. No clubbing or cyanosis. No edema. No rashes or suspicious lesions. Neuro: Alert and oriented X 3. Moves all extremities spontaneously. Gait is normal. CNII-XII grossly in tact. Psych:  Responds to questions appropriately with a normal affect. Diabetic foot exam: Inspection is normal. Sensation is intact. She has 2+ bilateral posterior tibial pulses. No palpable dorsalis pedis pulse bilaterally.      ASSESSMENT AND PLAN:  79 y.o. year old female with  1. DIABETES MELLITUS - Hemoglobin A1c  Microalbumin was done 07/26/12  At recent hospitalization they--- stopped her metformin.     --They adjusted her Lantus dose.     --They added mealtime insulin.  Will continue current dosing of Lantus and mealtime will insulin the same.  She is on aspirin She is on ACE inhibitor She is on statin therapy  2. HYPERTENSION BP at Goal. On ACE Inh. CMET done 04/14/13. OV. Can wait until next office visit to repeat this.  During hospitalization November 2013 she developed severe hyperkalemia. Bactrim and lisinopril were stopped and her potassium normalized. She had remained off ACE inhibitor since then up until her appointment with me on 11/10/12. At that time I reviewed her prior chart and the fact that she been on ACE inhibitor with no hypertrophy anemia until 12/28/11 at which time she was also on Bactrim. Given that she has  diabetes and hypertension I felt that we should try ACE inhibitor again. Started a low dose and planned close followup. Followup labs were normal with no hyperkalemia. She actually had followup lab on 10/ 1 as well as 10/17 and at both times  potassiums were nml at  4.4 and 4.9.  3. Hyponatremia At lab 11/24/12 sodium was low 125. We placed her on fluid restriction and followup lab on 12/10/2012 showed sodium 134. F/U CMET was done 04/14/13.  4. Hyperkalemia See #2 above -F/U  CMET was done 04/14/13.  5. HYPERLIPIDEMIA I did CMET/FLP 04/14/13. On her  last several lipid panels her LDL has been in the 50s.  Will decrease her pravastatin from 40 mg down to 20 mg.- - pravastatin (PRAVACHOL) 20 MG tablet; Take 1 tablet (20 mg total) by mouth daily.  Dispense: 90 tablet; Refill: 3   6. Osteoporosis On Fosamax. Fosamax was started in the year 2010 according to the patient and daughter (at prior OV). We discussed we would plan to continue it for 5 years and then stop it. Today I have told him to stop this medication as she has completed the course. I have removed Fosamax from the medicine list.  7. DIVERTICULOSIS, COLON  8. Right shoulder tendinitis At recent OV she c/o this--I provided a handout which I reviewed with her son who understands [place] and he can explain this to her in [place]. Also discussed  posture changes when she is sitting at rest as well as position changes when doing tasks. It also has multiple stretches and exercises that she can do at home and she is to do these 3 times a day.  9. Mammogram: 05/20/2013-- negative  10. Screening colonoscopy: 06/21/12: Polyp. Based on pathology recommend repeat 5 years. But generally recommended to stop screening colonoscopies at age 58. GI recommended that she discuss this with her PCP at the time of the repeat at age 34 to see whether to do repeat or not. We will discuss it at that time.  11. Immunizations: Pneumonia Vaccine: She received  Pneumovax 23 on 11/24/1997 and 03/14/2012. Now she needs the Prevnar 13. Patient agreeable. Will give this today. Tetanus: Had a 04/2011 Zostavax: She was told to check with her insurance but she has forgotten    Need for prophylactic vaccination against Streptococcus pneumoniae (pneumococcus) - Pneumococcal conjugate vaccine 13-valent  Routine office visit in 3 months. Follow up sooner if needed.  Signed, [address] [place], [place], [facility] 07/13/2013 8:21 AM

## 2013-09-05 ENCOUNTER — Other Ambulatory Visit: Payer: Self-pay | Admitting: Physician Assistant

## 2013-09-06 NOTE — Telephone Encounter (Signed)
Ok to refill fosamax, did not see on med list?

## 2013-09-07 ENCOUNTER — Telehealth: Payer: Self-pay | Admitting: Physician Assistant

## 2013-09-07 MED ORDER — INSULIN GLARGINE 100 UNIT/ML SOLOSTAR PEN: PEN_INJECTOR | SUBCUTANEOUS | Status: DC

## 2013-09-07 NOTE — Telephone Encounter (Signed)
At her last office visit with me which was 07/13/13 we reviewed the fact that she had completed her course of therapy of Fosamax--had been on it for the full 5 years. At that visit I stopped the medication and removed it from her medicine list. Tell her this medicine  has been stopped--does not need to take any longer.

## 2013-09-07 NOTE — Telephone Encounter (Signed)
Lantus sent to pharm and message left on VM to DC Fosamax per LOV note.

## 2013-09-07 NOTE — Telephone Encounter (Signed)
Prescription sent to pharmacy for Lantus.   Refill for Fosamax denied.

## 2013-09-07 NOTE — Telephone Encounter (Signed)
[phone]   Pt is needing a refill on LANTUS SOLOSTAR 100 UNIT/ML Solostar Pen Alendronate 70 MG   Pharmacy [facility]

## 2013-09-08 ENCOUNTER — Encounter: Payer: Self-pay | Admitting: Physician Assistant

## 2013-09-08 ENCOUNTER — Ambulatory Visit (INDEPENDENT_AMBULATORY_CARE_PROVIDER_SITE_OTHER): Payer: Medicaid Other | Admitting: Physician Assistant

## 2013-09-08 VITALS — BP 142/68 | HR 64 | Temp 98.2°F | Resp 14 | Ht <= 58 in | Wt 154.0 lb

## 2013-09-08 DIAGNOSIS — IMO0001 Reserved for inherently not codable concepts without codable children: Secondary | ICD-10-CM

## 2013-09-08 DIAGNOSIS — I10 Essential (primary) hypertension: Secondary | ICD-10-CM

## 2013-09-08 DIAGNOSIS — K5730 Diverticulosis of large intestine without perforation or abscess without bleeding: Secondary | ICD-10-CM

## 2013-09-08 DIAGNOSIS — E1165 Type 2 diabetes mellitus with hyperglycemia: Secondary | ICD-10-CM

## 2013-09-08 DIAGNOSIS — E871 Hypo-osmolality and hyponatremia: Secondary | ICD-10-CM

## 2013-09-08 DIAGNOSIS — E119 Type 2 diabetes mellitus without complications: Secondary | ICD-10-CM

## 2013-09-08 DIAGNOSIS — D649 Anemia, unspecified: Secondary | ICD-10-CM

## 2013-09-08 DIAGNOSIS — E875 Hyperkalemia: Secondary | ICD-10-CM

## 2013-09-08 DIAGNOSIS — M810 Age-related osteoporosis without current pathological fracture: Secondary | ICD-10-CM

## 2013-09-08 DIAGNOSIS — E785 Hyperlipidemia, unspecified: Secondary | ICD-10-CM

## 2013-09-08 LAB — HEMOGLOBIN A1C, FINGERSTICK: Hgb A1C (fingerstick): 7.6 % — ABNORMAL HIGH (ref <5.7)

## 2013-09-08 MED ORDER — INSULIN ASPART 100 UNIT/ML FLEXPEN: PEN_INJECTOR | SUBCUTANEOUS | Status: DC

## 2013-09-08 MED ORDER — LISINOPRIL 10 MG PO TABS: 10.0000 mg | ORAL_TABLET | Freq: Every day | ORAL | Status: DC

## 2013-09-08 MED ORDER — ACCU-CHEK SOFTCLIX LANCETS MISC: Status: DC

## 2013-09-08 MED ORDER — PRAVASTATIN SODIUM 20 MG PO TABS: 20.0000 mg | ORAL_TABLET | Freq: Every day | ORAL | Status: DC

## 2013-09-08 MED ORDER — "PEN NEEDLES 5/16"" 31G X 8 MM MISC": 1.0000 | Freq: Three times a day (TID) | Status: DC

## 2013-09-08 MED ORDER — GLUCOSE BLOOD VI STRP: ORAL_STRIP | Status: DC

## 2013-09-08 NOTE — Progress Notes (Signed)
Patient ID: BETSAIDA MISSOURI MRN: [id number], DOB: [birth year], 79 y.o. Date of Encounter: @DATE @  Chief Complaint:  Chief Complaint  Patient presents with  . Discuss medication    HPI: 79 y.o. year old female  presents with a different family member than is usually here. It is a female family member and she can translate as the patient does not speak much [place].  As well, the [facility] interpreter is here.   She states that Ms. Duwayne Heck has no questions or concerns except is wondering whether she could stop the mealtime insulins.--Just does not want to have to inject 4 times a day if not necessary.   She is doing Lantus 36 units at night. She is giving 6 units of short-acting insulin 3 times a day at mealtimes.  Today the daughter who is with her says that she has been out of town recently so has not been available to readings down on to the log sheet. Therefore they bring in a log sheet that do not contain any recent readings. And they stated they forgot to bring in the meter  to get the readings off of that.  Taking the pravastatin as directed. Having no myalgias or other adverse effects. Taking all of the medicines as directed.   Past Medical History  Diagnosis Date  . Hypertension   . Depression     hx of   . Diabetes mellitus without complication   . Chronic kidney disease     hx of kidney stones,   . Arthritis   . Hearing loss of both ears   . Tinnitus of both ears   . MRSA infection     Oct 13 - Nov 13  . Ganglion, left ankle and foot   . Osteoporosis      Home Meds:  Outpatient Prescriptions Prior to Visit  Medication Sig Dispense Refill  . Cholecalciferol (VITAMIN D3) 400 UNITS tablet Take 400 Units by mouth daily.      . Insulin Glargine (LANTUS SOLOSTAR) 100 UNIT/ML Solostar Pen INJECT 10 UNITS INTO THE SKIN AT BEDTIME AS DIRECTED. UP TO 40 UNITS AT BEDTIME.  15 mL  2  . Insulin Pen Needle (PEN NEEDLES 31GX5/16") 31G X 8 MM MISC 1 each by Does not apply route 4  (four) times daily -  before meals and at bedtime.  150 each  11  . lisinopril (PRINIVIL,ZESTRIL) 10 MG tablet Take 1 tablet (10 mg total) by mouth daily.  90 tablet  3  . pravastatin (PRAVACHOL) 20 MG tablet Take 1 tablet (20 mg total) by mouth daily.  90 tablet  3  . aspirin 325 MG tablet Take 325 mg by mouth daily.      . calcium-vitamin D (OSCAL WITH D) 500-200 MG-UNIT per tablet Take 1 tablet by mouth daily.      Marland Kitchen ACCU-CHEK AVIVA PLUS test strip USE TO TEST BLOOD SUGAR THREE TIMES DAILY  100 each  5  . ACCU-CHEK SOFTCLIX LANCETS lancets Insulin dependent diabetic on sliding scale insulin   ICD 250.02  200 each  11  . insulin aspart (NOVOLOG) 100 UNIT/ML FlexPen Follow sliding scale as ordered  15 mL  5   No facility-administered medications prior to visit.     Allergies:  Allergies  Allergen Reactions  . Bactrim [Sulfamethoxazole-Trimethoprim]     Hyperkalemia  . Rifampin Other (See Comments)    Severe thrombocytopenia  . Vancomycin Other (See Comments)    Severe thrombocytopenia  History   Social History  . Marital Status: Widowed    Spouse Name: N/A    Number of Children: N/A  . Years of Education: N/A   Occupational History  . Not on file.   Social History Main Topics  . Smoking status: Never Smoker   . Smokeless tobacco: Never Used  . Alcohol Use: No  . Drug Use: No  . Sexual Activity: No   Other Topics Concern  . Not on file   Social History Narrative   ** Merged History Encounter **        Family History  Problem Relation Age of Onset  . Colon cancer Neg Hx   . Hypertension Mother      Review of Systems:  See HPI for pertinent ROS. All other ROS negative.    Physical Exam: Blood pressure 142/68, pulse 64, temperature 98.2 F (36.8 C), temperature source Oral, resp. rate 14, height 4\' 9"  (1.448 m), weight 154 lb (69.854 kg)., Body mass index is 33.32 kg/(m^2). General: WNWD Hispanic Female. Appears in no acute distress. Neck: Supple. No  thyromegaly. No lymphadenopathy. No carotid bruit. Lungs: Clear bilaterally to auscultation without wheezes, rales, or rhonchi. Breathing is unlabored. Heart: RRR with S1 S2. No murmurs, rubs, or gallops. Abdomen: Soft, non-tender, non-distended with normoactive bowel sounds. No hepatomegaly. No rebound/guarding. No obvious abdominal masses. Musculoskeletal:  Strength and tone normal for age. Extremities/Skin: Warm and dry. No clubbing or cyanosis. No edema. No rashes or suspicious lesions. Neuro: Alert and oriented X 3. Moves all extremities spontaneously. Gait is normal. CNII-XII grossly in tact. Psych:  Responds to questions appropriately with a normal affect. Diabetic foot exam: Inspection is normal. Sensation is intact. She has 2+ bilateral posterior tibial pulses. 1+ dorsalis pedis pulse bilaterally.      ASSESSMENT AND PLAN:  79 y.o. year old female with  1. DIABETES MELLITUS - Hemoglobin A1c --MicroAlbumin: Check Now--last was 07/26/2012  At recent hospitalization they--- stopped her metformin.     --They adjusted her Lantus dose.     --They added mealtime insulin.  Will continue current dosing of Lantus and mealtime insulin the same.  She is on aspirin She is on ACE inhibitor She is on statin therapy  She has had recent diabetic eye exam. Actually says that she had cataract surgery July 6. Diabetic foot exam looks good.  2. HYPERTENSION BP at Goal. On ACE Inh. CMET done 04/14/13. OV. Can wait until next office visit to repeat this.  During hospitalization November 2013 she developed severe hyperkalemia. Bactrim and lisinopril were stopped and her potassium normalized. She had remained off ACE inhibitor since then up until her appointment with me on 11/10/12. At that time I reviewed her prior chart and the fact that she been on ACE inhibitor with no hypertrophy anemia until 12/28/11 at which time she was also on Bactrim. Given that she has diabetes and hypertension I felt that  we should try ACE inhibitor again. Started a low dose and planned close followup. Followup labs were normal with no hyperkalemia. She actually had followup lab on 10/ 1 as well as 10/17 and at both times  potassiums were nml at  4.4 and 4.9.  3. Hyponatremia At lab 11/24/12 sodium was low 125. We placed her on fluid restriction and followup lab on 12/10/2012 showed sodium 134. F/U CMET was done 04/14/13.  4. Hyperkalemia See #2 above -F/U  CMET was done 04/14/13.  5. HYPERLIPIDEMIA I did CMET/FLP 04/14/13. On her last  several lipid panels her LDL has been in the 50s.  At [place] 07/13/2013  decreased her pravastatin from 40 mg down to 20 mg.- - pravastatin (PRAVACHOL) 20 MG tablet; Take 1 tablet (20 mg total) by mouth daily.  Dispense: 90 tablet; Refill: 3 She is not fasting today. Told them to schedule her next OV (in 3 mos) for early morning and come fasting.    6. Osteoporosis  Fosamax--stopped at 07/13/13 OV. Fosamax was started in the year 2010 according to the patient and daughter (at prior OV). We discussed we would plan to continue it for 5 years and then stop it. At [place] 07/13/13 I  told them to stop this medication as she has completed the course. 07/13/2013 I removed Fosamax from the medicine list.  7. DIVERTICULOSIS, COLON  8. Right shoulder tendinitis At recent OV she c/o this--I provided a handout which I reviewed with her son who understands [place] and he can explain this to her in [place]. Also discussed  posture changes when she is sitting at rest as well as position changes when doing tasks. It also has multiple stretches and exercises that she can do at home and she is to do these 3 times a day.  9. Mammogram: 05/20/2013-- negative  10. Screening colonoscopy: 06/21/12: Polyp. Based on pathology recommend repeat 5 years. But generally recommended to stop screening colonoscopies at age 9. GI recommended that she discuss this with her PCP at the time of the repeat at age 67 to see  whether to do repeat or not. We will discuss it at that time.  11. Immunizations: Pneumonia Vaccine: She received Pneumovax 23 on 11/24/1997 and 03/14/2012.   Prevnar 13 given here 07/13/2013 Tetanus: Had  04/2011 Zostavax: She was told to check with her insurance but she has forgotten   Routine office visit in 3 months. Follow up sooner if needed.--Schedule this early morning and come fasting to that appointment.  Marin Olp [place], [place], [facility] 09/08/2013 4:27 PM

## 2013-09-09 ENCOUNTER — Encounter: Payer: Self-pay | Admitting: *Deleted

## 2013-09-09 LAB — MICROALBUMIN, URINE: MICROALB UR: 0.61 mg/dL (ref 0.00–1.89)

## 2013-09-13 ENCOUNTER — Encounter: Payer: Self-pay | Admitting: Family Medicine

## 2013-09-14 ENCOUNTER — Other Ambulatory Visit: Payer: Medicaid Other

## 2013-09-28 ENCOUNTER — Ambulatory Visit (INDEPENDENT_AMBULATORY_CARE_PROVIDER_SITE_OTHER): Payer: Medicaid Other | Admitting: Physician Assistant

## 2013-09-28 ENCOUNTER — Encounter: Payer: Self-pay | Admitting: Physician Assistant

## 2013-09-28 VITALS — BP 130/74 | HR 80 | Temp 98.1°F | Resp 16 | Ht <= 58 in | Wt 152.0 lb

## 2013-09-28 DIAGNOSIS — M25519 Pain in unspecified shoulder: Secondary | ICD-10-CM

## 2013-09-28 DIAGNOSIS — M25511 Pain in right shoulder: Secondary | ICD-10-CM

## 2013-09-28 MED ORDER — DICLOFENAC SODIUM 1 % TD GEL: 4.0000 g | Freq: Four times a day (QID) | TRANSDERMAL | Status: DC

## 2013-09-28 MED ORDER — TRAMADOL HCL 50 MG PO TABS: ORAL_TABLET | ORAL | Status: DC

## 2013-09-28 NOTE — Progress Notes (Signed)
Patient ID: Joyce Gilmore MRN: [id number], DOB: 09-Aug-1934, 79 y.o. Date of Encounter: 09/28/2013, 6:35 PM    Chief Complaint:  Chief Complaint  Patient presents with  . Right arm pain x 3 days     HPI: 79 y.o. year old female present with the female family member and also the [place] interpreter.  They report that she has had problems with her right shoulder for a long, but it has worsened recently -- just over the past several days it has been causing her a lot more pain than her usual baseline had been. I reviewed that we have discussed this shoulder pain at prior office visits--- specifically note 04/14/2013 and 06/23/2013.  At this visit I have given her a handout which included Position changes and posture changes and also stretches for her to do at home. Today she says she has also been taking Tylenol with no effect.     Home Meds:   Outpatient Prescriptions Prior to Visit  Medication Sig Dispense Refill  . ACCU-CHEK SOFTCLIX LANCETS lancets Insulin dependent diabetic on sliding scale insulin   ICD 250.02  200 each  11  . aspirin 325 MG tablet Take 325 mg by mouth daily.      . calcium-vitamin D (OSCAL WITH D) 500-200 MG-UNIT per tablet Take 1 tablet by mouth daily.      . Cholecalciferol (VITAMIN D3) 400 UNITS tablet Take 400 Units by mouth daily.      Marland Kitchen glucose blood (ACCU-CHEK AVIVA PLUS) test strip USE TO TEST BLOOD SUGAR THREE TIMES DAILY  100 each  5  . insulin aspart (NOVOLOG) 100 UNIT/ML FlexPen Follow sliding scale as ordered  15 mL  5  . Insulin Pen Needle (PEN NEEDLES 31GX5/16") 31G X 8 MM MISC 1 each by Does not apply route 4 (four) times daily -  before meals and at bedtime.  150 each  11  . lisinopril (PRINIVIL,ZESTRIL) 10 MG tablet Take 1 tablet (10 mg total) by mouth daily.  90 tablet  3  . pravastatin (PRAVACHOL) 20 MG tablet Take 1 tablet (20 mg total) by mouth daily.  90 tablet  3  . Insulin Glargine (LANTUS SOLOSTAR) 100 UNIT/ML Solostar Pen INJECT 10  UNITS INTO THE SKIN AT BEDTIME AS DIRECTED. UP TO 40 UNITS AT BEDTIME.  15 mL  2   No facility-administered medications prior to visit.    Allergies:  Allergies  Allergen Reactions  . Bactrim [Sulfamethoxazole-Trimethoprim]     Hyperkalemia  . Rifampin Other (See Comments)    Severe thrombocytopenia  . Vancomycin Other (See Comments)    Severe thrombocytopenia      Review of Systems: See HPI for pertinent ROS. All other ROS negative.    Physical Exam: Blood pressure 130/74, pulse 80, temperature 98.1 F (36.7 C), temperature source Oral, resp. rate 16, height 4\' 9"  (1.448 m), weight 152 lb (68.947 kg)., Body mass index is 32.88 kg/(m^2). General:  WNWD Hispanic Female. Appears in no acute distress. Neck: Supple. No thyromegaly. No lymphadenopathy. Lungs: Clear bilaterally to auscultation without wheezes, rales, or rhonchi. Breathing is unlabored. Heart: Regular rhythm. No murmurs, rubs, or gallops. Msk:  Strength and tone normal for age. There is significant tenderness with palpation of top of right shoulder and just posterior to that. She has decreased ROM with forward extension and abduction.  5/5 forearm strength with abduction and adduction.  Extremities/Skin: Warm and dry.  Neuro: Alert and oriented X 3. Moves all extremities spontaneously. Gait  is normal. CNII-XII grossly in tact. Psych:  Responds to questions appropriately with a normal affect.     ASSESSMENT AND PLAN:  80 y.o. year old female with  1. Pain in joint, shoulder region, right She has seen Dr. Ninfa Linden in the past and had hip surgery by him several years ago. I will refer her back to Dr. Ninfa Linden. I will give her Voltaren gel and tramadol to use in the interim. - diclofenac sodium (VOLTAREN) 1 % GEL; Apply 4 g topically 4 (four) times daily.  Dispense: 100 g; Refill: 3 - traMADol (ULTRAM) 50 MG tablet; 1 - 2 every 8 hours as needed for pain.  Dispense: 60 tablet; Refill: 1 - Ambulatory referral to  Orthopedic Surgery   Signed, [name] [place], [place], [facility] 09/28/2013 6:35 PM

## 2013-12-06 ENCOUNTER — Ambulatory Visit (INDEPENDENT_AMBULATORY_CARE_PROVIDER_SITE_OTHER): Payer: Medicaid Other | Admitting: Physician Assistant

## 2013-12-06 ENCOUNTER — Encounter: Payer: Self-pay | Admitting: Physician Assistant

## 2013-12-06 VITALS — BP 144/70 | HR 76 | Temp 98.3°F | Resp 20 | Wt 153.0 lb

## 2013-12-06 DIAGNOSIS — R1013 Epigastric pain: Secondary | ICD-10-CM

## 2013-12-06 LAB — CBC W/MCH & 3 PART DIFF
HCT: 35.1 % — ABNORMAL LOW (ref 36.0–46.0)
HEMOGLOBIN: 12.8 g/dL (ref 12.0–15.0)
LYMPHS ABS: 2.1 10*3/uL (ref 0.7–4.0)
Lymphocytes Relative: 20 % (ref 12–46)
MCH: 29 pg (ref 26.0–34.0)
MCHC: 36.5 g/dL — ABNORMAL HIGH (ref 30.0–36.0)
MCV: 79.6 fL (ref 78.0–100.0)
NEUTROS ABS: 7.7 10*3/uL (ref 1.7–7.7)
Neutrophils Relative %: 74 % (ref 43–77)
PLATELETS: 208 10*3/uL (ref 150–400)
RBC: 4.41 MIL/uL (ref 3.87–5.11)
RDW: 15.2 % (ref 11.5–15.5)
WBC mixed population: 0.6 10*3/uL (ref 0.1–1.8)
WBC: 10.4 10*3/uL (ref 4.0–10.5)
WBCMIXPER: 6 % (ref 3–18)

## 2013-12-06 MED ORDER — OMEPRAZOLE 20 MG PO CPDR: 20.0000 mg | DELAYED_RELEASE_CAPSULE | Freq: Every day | ORAL | Status: DC

## 2013-12-07 ENCOUNTER — Telehealth: Payer: Self-pay | Admitting: Family Medicine

## 2013-12-07 LAB — COMPLETE METABOLIC PANEL WITH GFR
ALT: 23 U/L (ref 0–35)
AST: 24 U/L (ref 0–37)
Albumin: 4.1 g/dL (ref 3.5–5.2)
Alkaline Phosphatase: 81 U/L (ref 39–117)
BILIRUBIN TOTAL: 0.4 mg/dL (ref 0.2–1.2)
BUN: 17 mg/dL (ref 6–23)
CO2: 27 mEq/L (ref 19–32)
CREATININE: 0.89 mg/dL (ref 0.50–1.10)
Calcium: 9.9 mg/dL (ref 8.4–10.5)
Chloride: 91 mEq/L — ABNORMAL LOW (ref 96–112)
GFR, EST NON AFRICAN AMERICAN: 63 mL/min
GFR, Est African American: 72 mL/min
Glucose, Bld: 125 mg/dL — ABNORMAL HIGH (ref 70–99)
Potassium: 4.7 mEq/L (ref 3.5–5.3)
Sodium: 125 mEq/L — ABNORMAL LOW (ref 135–145)
Total Protein: 7.1 g/dL (ref 6.0–8.3)

## 2013-12-07 LAB — LIPASE: Lipase: 43 U/L (ref 0–75)

## 2013-12-07 LAB — H. PYLORI ANTIBODY, IGG: H Pylori IgG: 3.5 {ISR} — ABNORMAL HIGH

## 2013-12-07 LAB — AMYLASE: Amylase: 64 U/L (ref 0–105)

## 2013-12-07 MED ORDER — AMOXICILL-CLARITHRO-LANSOPRAZ PO MISC: Freq: Two times a day (BID) | ORAL | Status: DC

## 2013-12-07 NOTE — Progress Notes (Signed)
Patient ID: Joyce Gilmore MRN: [id number], DOB: April 26, 1934, 79 y.o. Date of Encounter: @DATE @  Chief Complaint:  Chief Complaint  Patient presents with  . epigastric pain x 8 days    constant burning pain, nausea with eating, no vomiting    HPI: 79 y.o. year old Hispanic female  presents with interpreter/translator as well as one of her daughters.  She reports that prior to 8 days ago she never had this type of pain before this. Reports that she has never had an endoscopy.  She reports that she has been currently taking aspirin daily. Reports that she has taken an Aleve approximately one per week but no other NSAIDs.  She reports that she has had a constant burning pain in her epigastric area for the past 8 days. Some nausea but no vomiting. Reports that she has had no change in her bowel habits at all. No diarrhea no constipation. No melena no hematochezia.   Past Medical History  Diagnosis Date  . Hypertension   . Depression     hx of   . Diabetes mellitus without complication   . Chronic kidney disease     hx of kidney stones,   . Arthritis   . Hearing loss of both ears   . Tinnitus of both ears   . MRSA infection     Oct 13 - Nov 13  . Ganglion, left ankle and foot   . Osteoporosis      Home Meds: Outpatient Prescriptions Prior to Visit  Medication Sig Dispense Refill  . ACCU-CHEK SOFTCLIX LANCETS lancets Insulin dependent diabetic on sliding scale insulin   ICD 250.02  200 each  11  . aspirin 325 MG tablet Take 325 mg by mouth daily.      . calcium-vitamin D (OSCAL WITH D) 500-200 MG-UNIT per tablet Take 1 tablet by mouth daily.      . Cholecalciferol (VITAMIN D3) 400 UNITS tablet Take 400 Units by mouth daily.      Marland Kitchen glucose blood (ACCU-CHEK AVIVA PLUS) test strip USE TO TEST BLOOD SUGAR THREE TIMES DAILY  100 each  5  . insulin aspart (NOVOLOG) 100 UNIT/ML FlexPen Follow sliding scale as ordered  15 mL  5  . Insulin Glargine (LANTUS) 100 UNIT/ML Solostar  Pen INJECT  36 UNITS AT BEDTIME.      . Insulin Pen Needle (PEN NEEDLES 31GX5/16") 31G X 8 MM MISC 1 each by Does not apply route 4 (four) times daily -  before meals and at bedtime.  150 each  11  . lisinopril (PRINIVIL,ZESTRIL) 10 MG tablet Take 1 tablet (10 mg total) by mouth daily.  90 tablet  3  . pravastatin (PRAVACHOL) 20 MG tablet Take 1 tablet (20 mg total) by mouth daily.  90 tablet  3  . diclofenac sodium (VOLTAREN) 1 % GEL Apply 4 g topically 4 (four) times daily.  100 g  3  . traMADol (ULTRAM) 50 MG tablet 1 - 2 every 8 hours as needed for pain.  60 tablet  1   No facility-administered medications prior to visit.    Allergies:  Allergies  Allergen Reactions  . Bactrim [Sulfamethoxazole-Trimethoprim]     Hyperkalemia  . Rifampin Other (See Comments)    Severe thrombocytopenia  . Vancomycin Other (See Comments)    Severe thrombocytopenia    History   Social History  . Marital Status: Widowed    Spouse Name: N/A    Number of Children: N/A  .  Years of Education: N/A   Occupational History  . Not on file.   Social History Main Topics  . Smoking status: Never Smoker   . Smokeless tobacco: Never Used  . Alcohol Use: No  . Drug Use: No  . Sexual Activity: No   Other Topics Concern  . Not on file   Social History Narrative   ** Merged History Encounter **        Family History  Problem Relation Age of Onset  . Colon cancer Neg Hx   . Hypertension Mother      Review of Systems:  See HPI for pertinent ROS. All other ROS negative.    Physical Exam: Blood pressure 144/70, pulse 76, temperature 98.3 F (36.8 C), temperature source Oral, resp. rate 20, weight 153 lb (69.4 kg)., Body mass index is 33.1 kg/(m^2). General: WNWD Hispanic Female. Appears in no acute distress. Neck: Supple. No thyromegaly. No lymphadenopathy. Lungs: Clear bilaterally to auscultation without wheezes, rales, or rhonchi. Breathing is unlabored. Heart: RRR with S1 S2. No murmurs,  rubs, or gallops. Abdomen: Soft, non-distended with normoactive bowel sounds. No hepatomegaly. No rebound/guarding. No obvious abdominal masses. There is tenderness with palpation in the epigastric area. No tenderness with palpation of the right upper quadrant. No tenderness with palpation of any other areas of the abdomen. DRE; exam is normal. Hemoccult test is negative. Musculoskeletal:  Strength and tone normal for age. Extremities/Skin: Warm and dry. Neuro: Alert and oriented X 3. Moves all extremities spontaneously. Gait is normal. CNII-XII grossly in tact. Psych:  Responds to questions appropriately with a normal affect.     ASSESSMENT AND PLAN:  79 y.o. year old female with  1. Abdominal pain, epigastric - COMPLETE METABOLIC PANEL WITH GFR - CBC w/MCH & 3 Part Diff - Fecal occult blood, imunochemical; Future - Amylase - Lipase - omeprazole (PRILOSEC) 20 MG capsule; Take 1 capsule (20 mg total) by mouth daily.  Dispense: 30 capsule; Refill: 3 - Ambulatory referral to Gastroenterology - H. pylori antibody, IgG - Fecal occult blood, imunochemical   CBC run while pt in office. White count normal and hemoglobin and hematocrit normal. Hemoccult negative in the office.  Will send off other labs. Will add omeprazole daily. She is to stop taking aspirin for now. Stop using Aleve for any type of NSAID/anti-inflammatory.  I will followup with her once we get lab results. I will go ahead and place order for referral to GI so that if we do not have any definitive diagnoses once we obtain lab results, and if her symptoms do not resolve after above medication adjustments,  then we will have plan in place for followup with GI.  [address] [place], [place], [facility] 12/07/2013 1:36 PM

## 2013-12-07 NOTE — Telephone Encounter (Signed)
Spoke to daughter, who speaks [place].  Told of lab results and need to take PrevPac. Discussed fluid restriction.  Daughter stated understanding of all provider recommendations.

## 2013-12-07 NOTE — Telephone Encounter (Signed)
Message copied by Olena Mater on Wed Dec 07, 2013  3:38 PM ------      Message from: Dena Billet      Created: Wed Dec 07, 2013  1:48 PM       Sodium is low so she needs to restrict fluid intake.  Fluid intake for entire 24-hour period should only be about 1 - 2 Liters. This includes all fluids including things such as milk in her cereal etc.      Also tell her that the H. pylori test is positive. She has no prior history of H. Pylori. Treat with Prevpac.      At her visit yesterday I went ahead and ordered GI referral. Tell her to follow up with them only if her symptoms persist but can cancel that appointment if her symptoms resolve after treatment of H. pylori is completed. ------

## 2013-12-08 ENCOUNTER — Ambulatory Visit (INDEPENDENT_AMBULATORY_CARE_PROVIDER_SITE_OTHER): Payer: Medicaid Other | Admitting: Physician Assistant

## 2013-12-08 ENCOUNTER — Telehealth: Payer: Self-pay | Admitting: Family Medicine

## 2013-12-08 ENCOUNTER — Encounter: Payer: Self-pay | Admitting: Physician Assistant

## 2013-12-08 VITALS — BP 116/64 | HR 68 | Temp 98.0°F | Resp 20 | Wt 153.0 lb

## 2013-12-08 DIAGNOSIS — A048 Other specified bacterial intestinal infections: Secondary | ICD-10-CM

## 2013-12-08 DIAGNOSIS — I10 Essential (primary) hypertension: Secondary | ICD-10-CM

## 2013-12-08 DIAGNOSIS — E119 Type 2 diabetes mellitus without complications: Secondary | ICD-10-CM

## 2013-12-08 DIAGNOSIS — B9681 Helicobacter pylori [H. pylori] as the cause of diseases classified elsewhere: Secondary | ICD-10-CM

## 2013-12-08 DIAGNOSIS — Z794 Long term (current) use of insulin: Secondary | ICD-10-CM

## 2013-12-08 DIAGNOSIS — E785 Hyperlipidemia, unspecified: Secondary | ICD-10-CM

## 2013-12-08 DIAGNOSIS — Z23 Encounter for immunization: Secondary | ICD-10-CM

## 2013-12-08 LAB — HEMOGLOBIN A1C, FINGERSTICK: Hgb A1C (fingerstick): 7.5 % — ABNORMAL HIGH (ref <5.7)

## 2013-12-08 LAB — FECAL OCCULT BLOOD, IMMUNOCHEMICAL: Fecal Occult Blood: NEGATIVE

## 2013-12-08 MED ORDER — CLARITHROMYCIN 500 MG PO TABS: 500.0000 mg | ORAL_TABLET | Freq: Two times a day (BID) | ORAL | Status: DC

## 2013-12-08 MED ORDER — AMOXICILLIN 500 MG PO CAPS: ORAL_CAPSULE | ORAL | Status: DC

## 2013-12-08 MED ORDER — LANSOPRAZOLE 30 MG PO CPDR: 30.0000 mg | DELAYED_RELEASE_CAPSULE | Freq: Two times a day (BID) | ORAL | Status: DC

## 2013-12-08 NOTE — Telephone Encounter (Signed)
Rec'd PA for PrevPac(generic) for pt with H-Pylori.  So provider wrote for the three separate meds to make up pack.  Now have rec'd PA for the Lansoprazole.  The two antibiotics went through and patient has already picked them up.  Had to call [employer] to get prior approval for this.  Then they wanted to know if pt had tried and failed any other PPI.  Had to explain that this was part of PrevPac for H-pylori.  Pt ill now, needs medication now.  Pt has already picked up antibiotics.  They are to expedite PA through pharmacy service so pt can get last component to PrevPac.  Confirmation # for claim is [id number]

## 2013-12-08 NOTE — Progress Notes (Signed)
Patient ID: KANOELANI DOBIES MRN: [id number], DOB: 02-29-1934, 79 y.o. Date of Encounter: $RemoveBefor'@DATE'aCnKomysDNQS$ @  Chief Complaint:  Chief Complaint  Patient presents with  . 3 month check up    not fasting    HPI: 79 y.o. year old female  presents with a different family member than is usually here. It is a female family member and he can translate as the patient does not speak much [place].  As well, the [facility] interpreter is here.   She states that Ms. Duwayne Heck has no questions or concerns except is wondering whether she could stop the mealtime insulins.--Just does not want to have to inject 4 times a day if not necessary.   She is doing Lantus 36 units at night. She is giving 6 units of short-acting insulin 3 times a day at mealtimes.  They did not bring in BS Log sheet.   Taking the pravastatin as directed. Having no myalgias or other adverse effects. Taking all of the medicines as directed.   Past Medical History  Diagnosis Date  . Hypertension   . Depression     hx of   . Diabetes mellitus without complication   . Chronic kidney disease     hx of kidney stones,   . Arthritis   . Hearing loss of both ears   . Tinnitus of both ears   . MRSA infection     Oct 13 - Nov 13  . Ganglion, left ankle and foot   . Osteoporosis      Home Meds:  Outpatient Prescriptions Prior to Visit  Medication Sig Dispense Refill  . ACCU-CHEK SOFTCLIX LANCETS lancets Insulin dependent diabetic on sliding scale insulin   ICD 250.02  200 each  11  . amoxicillin-clarithromycin-lansoprazole (PREVPAC) combo pack Take by mouth 2 (two) times daily. Follow package directions.  1 kit  0  . aspirin 325 MG tablet Take 325 mg by mouth daily.      . calcium-vitamin D (OSCAL WITH D) 500-200 MG-UNIT per tablet Take 1 tablet by mouth daily.      . Cholecalciferol (VITAMIN D3) 400 UNITS tablet Take 400 Units by mouth daily.      . diclofenac sodium (VOLTAREN) 1 % GEL Apply 4 g topically 4 (four) times daily.  100 g  3  .  glucose blood (ACCU-CHEK AVIVA PLUS) test strip USE TO TEST BLOOD SUGAR THREE TIMES DAILY  100 each  5  . insulin aspart (NOVOLOG) 100 UNIT/ML FlexPen Follow sliding scale as ordered  15 mL  5  . Insulin Glargine (LANTUS) 100 UNIT/ML Solostar Pen INJECT  36 UNITS AT BEDTIME.      . Insulin Pen Needle (PEN NEEDLES 31GX5/16") 31G X 8 MM MISC 1 each by Does not apply route 4 (four) times daily -  before meals and at bedtime.  150 each  11  . lisinopril (PRINIVIL,ZESTRIL) 10 MG tablet Take 1 tablet (10 mg total) by mouth daily.  90 tablet  3  . omeprazole (PRILOSEC) 20 MG capsule Take 1 capsule (20 mg total) by mouth daily.  30 capsule  3  . pravastatin (PRAVACHOL) 20 MG tablet Take 1 tablet (20 mg total) by mouth daily.  90 tablet  3  . traMADol (ULTRAM) 50 MG tablet 1 - 2 every 8 hours as needed for pain.  60 tablet  1   No facility-administered medications prior to visit.     Allergies:  Allergies  Allergen Reactions  . Bactrim [Sulfamethoxazole-Trimethoprim]  Hyperkalemia  . Rifampin Other (See Comments)    Severe thrombocytopenia  . Vancomycin Other (See Comments)    Severe thrombocytopenia    History   Social History  . Marital Status: Widowed    Spouse Name: N/A    Number of Children: N/A  . Years of Education: N/A   Occupational History  . Not on file.   Social History Main Topics  . Smoking status: Never Smoker   . Smokeless tobacco: Never Used  . Alcohol Use: No  . Drug Use: No  . Sexual Activity: No   Other Topics Concern  . Not on file   Social History Narrative   ** Merged History Encounter **        Family History  Problem Relation Age of Onset  . Colon cancer Neg Hx   . Hypertension Mother      Review of Systems:  See HPI for pertinent ROS. All other ROS negative.    Physical Exam: Blood pressure 116/64, pulse 68, temperature 98 F (36.7 C), temperature source Oral, resp. rate 20, weight 153 lb (69.4 kg)., Body mass index is 33.1  kg/(m^2). General: WNWD Hispanic Female. Appears in no acute distress. Neck: Supple. No thyromegaly. No lymphadenopathy. No carotid bruit. Lungs: Clear bilaterally to auscultation without wheezes, rales, or rhonchi. Breathing is unlabored. Heart: RRR with S1 S2. No murmurs, rubs, or gallops. Abdomen: Soft, non-distended with normoactive bowel sounds. No hepatomegaly. No rebound/guarding. No obvious abdominal masses. Mild tenderness with palpation in epigastric area only.  Musculoskeletal:  Strength and tone normal for age. Extremities/Skin: Warm and dry. No edema.  Neuro: Alert and oriented X 3. Moves all extremities spontaneously. Gait is normal. CNII-XII grossly in tact. Psych:  Responds to questions appropriately with a normal affect. Diabetic foot exam: Inspection is normal. Sensation is intact. She has 2+ bilateral posterior tibial pulses. 1+ dorsalis pedis pulse bilaterally.      ASSESSMENT AND PLAN:  79 y.o. year old female with   H. pylori infection She had recent office visit with complaints of epigastric pain. H. pylori test was positive. On that result note I had said to prescribe Prevpac. However we just learnedhat her insurance will not cover Prevpac but will pay for each individual medication in the Prevpac. Therefore I am prescribing the below medications at this time did discuss this with them at the office visit. - lansoprazole (PREVACID) 30 MG capsule; Take 1 capsule (30 mg total) by mouth 2 (two) times daily before a meal.  Dispense: 28 capsule; Refill: 0 - amoxicillin (AMOXIL) 500 MG capsule; 2 pills twice a day for 14 days.  Dispense: 56 capsule; Refill: 0 - clarithromycin (BIAXIN) 500 MG tablet; Take 1 tablet (500 mg total) by mouth 2 (two) times daily.  Dispense: 28 tablet; Refill: 0   Need for prophylactic vaccination and inoculation against influenza - Flu Vaccine QUAD 36+ mos PF IM (Fluarix Quad PF)    1. DIABETES MELLITUS - Hemoglobin  A1c  --MicroAlbumin:--last was 08/2013  At recent hospitalization they--- stopped her metformin.     --They adjusted her Lantus dose.     --They added mealtime insulin.  Will continue current dosing of Lantus and mealtime insulin the same.  She is on aspirin She is on ACE inhibitor She is on statin therapy  She has had recent diabetic eye exam. Actually says that she had cataract surgery July 6. Diabetic foot exam looks good.  2. HYPERTENSION BP at Goal. On ACE Inh. CMET done  11/2013 OV--she just had OV sec to epigastric pain. Can wait to repeat this.  During hospitalization November 2013 she developed severe hyperkalemia. Bactrim and lisinopril were stopped and her potassium normalized. She had remained off ACE inhibitor since then up until her appointment with me on 11/10/12. At that time I reviewed her prior chart and the fact that she been on ACE inhibitor with no hypertrophy anemia until 12/28/11 at which time she was also on Bactrim. Given that she has diabetes and hypertension I felt that we should try ACE inhibitor again. Started a low dose and planned close followup. Followup labs were normal with no hyperkalemia. She actually had followup lab on 10/ 1 as well as 10/17 and at both times  potassiums were nml at  4.4 and 4.9.  3. Hyponatremia At lab 11/24/12 sodium was low 125. We placed her on fluid restriction and followup lab on 12/10/2012 showed sodium 134. F/U CMET was done 04/14/13.  4. Hyperkalemia See #2 above -F/U  CMET was done 04/14/13.  5. HYPERLIPIDEMIA I did CMET/FLP 04/14/13. On her last several lipid panels her LDL has been in the 50s.  At OV 07/13/2013  decreased her pravastatin from 40 mg down to 20 mg.- - pravastatin (PRAVACHOL) 20 MG tablet; Take 1 tablet (20 mg total) by mouth daily.  Dispense: 90 tablet; Refill: 3 She is not fasting today. At her LOV she was not fasting and I told them to come fasting for next appt, but again today--not fasting.  Will have  to wait to check FLP in future!!  Told them to schedule her next OV (in 3 mos) for early morning and come fasting.    6. Osteoporosis  Fosamax--stopped at 07/13/13 OV. Fosamax was started in the year 2010 according to the patient and daughter (at prior OV). We discussed we would plan to continue it for 5 years and then stop it. At OV 07/13/13 I  told them to stop this medication as she has completed the course. 07/13/2013 I removed Fosamax from the medicine list.  7. DIVERTICULOSIS, COLON  8. Right shoulder tendinitis At recent OV she c/o this--I provided a handout which I reviewed with her son who understands [place] and he can explain this to her in [place]. Also discussed  posture changes when she is sitting at rest as well as position changes when doing tasks. It also has multiple stretches and exercises that she can do at home and she is to do these 3 times a day. She had followup office visit with me on 09/28/13 reporting that her shoulder pain had not improved with doing the stretches/exercises. At that visit I prescribed Voltaren gel and tramadol and did orthopedic referral. She has seen Dr. Magnus Ivan in the past for her hip surgery so I referred her back to Dr. Magnus Ivan.  9. Mammogram: 05/20/2013-- negative  10. Screening colonoscopy: 06/21/12: Polyp. Based on pathology recommend repeat 5 years. But generally recommended to stop screening colonoscopies at age 38. GI recommended that she discuss this with her PCP at the time of the repeat at age 17 to see whether to do repeat or not. We will discuss it at that time.  11. Immunizations: Pneumonia Vaccine: She received Pneumovax 23 on 11/24/1997 and 03/14/2012.   Prevnar 13 given here 07/13/2013 Tetanus: Had  04/2011 Zostavax: She was told to check with her insurance but she has forgotten Influenza Vaccine: Discussed with her at her office visit 12/08/2013. Patient and family member agreeable to receive his  influenza vaccine given  today.   Routine office visit in 3 months. Follow up sooner if needed.--Schedule this early morning and come fasting to that appointment.  Signed, [address] [place], [place], [facility] 12/08/2013 11:40 AM

## 2013-12-09 ENCOUNTER — Ambulatory Visit: Payer: Medicaid Other | Admitting: Internal Medicine

## 2013-12-13 ENCOUNTER — Encounter: Payer: Self-pay | Admitting: Family Medicine

## 2013-12-13 NOTE — Telephone Encounter (Signed)
Received PA approval for the Lansoprazole to complete her PrevPac.  Conf # [id number] P.  Unable to reach patient.  Did notify pharmacy

## 2013-12-14 ENCOUNTER — Encounter (HOSPITAL_COMMUNITY): Payer: Self-pay | Admitting: Emergency Medicine

## 2013-12-14 ENCOUNTER — Emergency Department (HOSPITAL_COMMUNITY): Payer: Medicaid Other

## 2013-12-14 ENCOUNTER — Emergency Department (HOSPITAL_COMMUNITY)
Admission: EM | Admit: 2013-12-14 | Discharge: 2013-12-14 | Disposition: A | Discharge status: Home / Self Care | Payer: Medicaid Other | Attending: Emergency Medicine | Admitting: Emergency Medicine

## 2013-12-14 DIAGNOSIS — N189 Chronic kidney disease, unspecified: Secondary | ICD-10-CM | POA: Diagnosis not present

## 2013-12-14 DIAGNOSIS — R110 Nausea: Secondary | ICD-10-CM | POA: Diagnosis not present

## 2013-12-14 DIAGNOSIS — A048 Other specified bacterial intestinal infections: Secondary | ICD-10-CM

## 2013-12-14 DIAGNOSIS — H9193 Unspecified hearing loss, bilateral: Secondary | ICD-10-CM | POA: Insufficient documentation

## 2013-12-14 DIAGNOSIS — E119 Type 2 diabetes mellitus without complications: Secondary | ICD-10-CM | POA: Diagnosis not present

## 2013-12-14 DIAGNOSIS — R630 Anorexia: Secondary | ICD-10-CM | POA: Insufficient documentation

## 2013-12-14 DIAGNOSIS — Z794 Long term (current) use of insulin: Secondary | ICD-10-CM | POA: Insufficient documentation

## 2013-12-14 DIAGNOSIS — I129 Hypertensive chronic kidney disease with stage 1 through stage 4 chronic kidney disease, or unspecified chronic kidney disease: Secondary | ICD-10-CM | POA: Diagnosis not present

## 2013-12-14 DIAGNOSIS — R109 Unspecified abdominal pain: Secondary | ICD-10-CM

## 2013-12-14 DIAGNOSIS — R1011 Right upper quadrant pain: Secondary | ICD-10-CM | POA: Diagnosis not present

## 2013-12-14 DIAGNOSIS — Z8614 Personal history of Methicillin resistant Staphylococcus aureus infection: Secondary | ICD-10-CM | POA: Insufficient documentation

## 2013-12-14 DIAGNOSIS — R1013 Epigastric pain: Secondary | ICD-10-CM | POA: Diagnosis present

## 2013-12-14 DIAGNOSIS — R1012 Left upper quadrant pain: Secondary | ICD-10-CM | POA: Diagnosis not present

## 2013-12-14 DIAGNOSIS — K2970 Gastritis, unspecified, without bleeding: Secondary | ICD-10-CM | POA: Diagnosis not present

## 2013-12-14 DIAGNOSIS — Z8659 Personal history of other mental and behavioral disorders: Secondary | ICD-10-CM | POA: Insufficient documentation

## 2013-12-14 DIAGNOSIS — M549 Dorsalgia, unspecified: Secondary | ICD-10-CM | POA: Diagnosis not present

## 2013-12-14 DIAGNOSIS — B9681 Helicobacter pylori [H. pylori] as the cause of diseases classified elsewhere: Secondary | ICD-10-CM | POA: Insufficient documentation

## 2013-12-14 DIAGNOSIS — Z79899 Other long term (current) drug therapy: Secondary | ICD-10-CM | POA: Diagnosis not present

## 2013-12-14 LAB — URINE MICROSCOPIC-ADD ON

## 2013-12-14 LAB — COMPREHENSIVE METABOLIC PANEL WITH GFR
ALT: 20 U/L (ref 0–35)
AST: 24 U/L (ref 0–37)
Albumin: 3.6 g/dL (ref 3.5–5.2)
Alkaline Phosphatase: 77 U/L (ref 39–117)
Anion gap: 15 (ref 5–15)
BUN: 18 mg/dL (ref 6–23)
CO2: 20 meq/L (ref 19–32)
Calcium: 9.4 mg/dL (ref 8.4–10.5)
Chloride: 93 meq/L — ABNORMAL LOW (ref 96–112)
Creatinine, Ser: 0.97 mg/dL (ref 0.50–1.10)
GFR calc Af Amer: 64 mL/min — ABNORMAL LOW
GFR calc non Af Amer: 55 mL/min — ABNORMAL LOW
Glucose, Bld: 72 mg/dL (ref 70–99)
Potassium: 4.4 meq/L (ref 3.7–5.3)
Sodium: 128 meq/L — ABNORMAL LOW (ref 137–147)
Total Bilirubin: 0.3 mg/dL (ref 0.3–1.2)
Total Protein: 7.5 g/dL (ref 6.0–8.3)

## 2013-12-14 LAB — URINALYSIS, ROUTINE W REFLEX MICROSCOPIC
Bilirubin Urine: NEGATIVE
Glucose, UA: NEGATIVE mg/dL
Hgb urine dipstick: NEGATIVE
Ketones, ur: NEGATIVE mg/dL
Nitrite: NEGATIVE
Protein, ur: NEGATIVE mg/dL
Specific Gravity, Urine: 1.004 — ABNORMAL LOW (ref 1.005–1.030)
Urobilinogen, UA: 0.2 mg/dL (ref 0.0–1.0)
pH: 6 (ref 5.0–8.0)

## 2013-12-14 LAB — CBC WITH DIFFERENTIAL/PLATELET
Basophils Absolute: 0 K/uL (ref 0.0–0.1)
Basophils Relative: 0 % (ref 0–1)
Eosinophils Absolute: 0.1 K/uL (ref 0.0–0.7)
Eosinophils Relative: 1 % (ref 0–5)
HCT: 33.7 % — ABNORMAL LOW (ref 36.0–46.0)
Hemoglobin: 11.6 g/dL — ABNORMAL LOW (ref 12.0–15.0)
Lymphocytes Relative: 28 % (ref 12–46)
Lymphs Abs: 3 K/uL (ref 0.7–4.0)
MCH: 27.7 pg (ref 26.0–34.0)
MCHC: 34.4 g/dL (ref 30.0–36.0)
MCV: 80.4 fL (ref 78.0–100.0)
Monocytes Absolute: 1 K/uL (ref 0.1–1.0)
Monocytes Relative: 9 % (ref 3–12)
Neutro Abs: 6.7 K/uL (ref 1.7–7.7)
Neutrophils Relative %: 62 % (ref 43–77)
Platelets: 211 K/uL (ref 150–400)
RBC: 4.19 MIL/uL (ref 3.87–5.11)
RDW: 14.2 % (ref 11.5–15.5)
WBC: 10.9 K/uL — ABNORMAL HIGH (ref 4.0–10.5)

## 2013-12-14 LAB — I-STAT TROPONIN, ED: TROPONIN I, POC: 0.01 ng/mL (ref 0.00–0.08)

## 2013-12-14 LAB — I-STAT CG4 LACTIC ACID, ED: Lactic Acid, Venous: 0.61 mmol/L (ref 0.5–2.2)

## 2013-12-14 LAB — LIPASE, BLOOD: Lipase: 55 U/L (ref 11–59)

## 2013-12-14 MED ORDER — SODIUM CHLORIDE 0.9 % IV BOLUS (SEPSIS)
1000.0000 mL | Freq: Once | INTRAVENOUS | Status: AC
  Administered 2013-12-14: 1000 mL via INTRAVENOUS

## 2013-12-14 MED ORDER — ONDANSETRON HCL 4 MG/2ML IJ SOLN
4.0000 mg | Freq: Once | INTRAMUSCULAR | Status: AC
  Administered 2013-12-14: 4 mg via INTRAVENOUS
  Filled 2013-12-14: qty 2

## 2013-12-14 MED ORDER — MORPHINE SULFATE 4 MG/ML IJ SOLN
4.0000 mg | Freq: Once | INTRAMUSCULAR | Status: AC
  Administered 2013-12-14: 4 mg via INTRAVENOUS
  Filled 2013-12-14: qty 1

## 2013-12-14 MED ORDER — SUCRALFATE 1 GM/10ML PO SUSP: 1.0000 g | Freq: Three times a day (TID) | ORAL | Status: DC

## 2013-12-14 MED ORDER — ONDANSETRON HCL 4 MG PO TABS: 4.0000 mg | ORAL_TABLET | Freq: Four times a day (QID) | ORAL | Status: DC | PRN

## 2013-12-14 NOTE — ED Notes (Signed)
RN used interpreter line to translate from [place] to [place].   Interpreter line [phone] Interpreter Trudee Grip

## 2013-12-14 NOTE — ED Notes (Signed)
Pt sent here by doctor for back pain, side pain and abdominal pain. sts currently taking meds for infection. Pt on 2 abx and omeprazole.

## 2013-12-14 NOTE — ED Provider Notes (Addendum)
CSN: [id number]     Arrival date & time 12/14/13  1638 History   First MD Initiated Contact with Patient 12/14/13 1740     Chief Complaint  Patient presents with  . Back Pain  . Flank Pain  . Abdominal Pain     (Consider location/radiation/quality/duration/timing/severity/associated sxs/prior Treatment) Patient is a 79 y.o. female presenting with flank pain and abdominal pain. The history is provided by the patient and a relative. The history is limited by a language barrier. A language interpreter was used.  Flank Pain Associated symptoms include abdominal pain. Pertinent negatives include no chest pain and no shortness of breath.  Abdominal Pain Pain location:  Epigastric, LUQ and RUQ Pain quality: burning and gnawing   Pain radiates to:  Back Pain severity:  Moderate Onset quality:  Gradual Duration:  2 weeks Timing:  Constant Progression:  Worsening Chronicity:  New Context: eating   Context comment:  Pt saw her doctor last week and started on treatment for h.pylori which was found when she presented for epigastric abdominal pain Relieved by:  Nothing Worsened by:  Eating (the abx given by her doctor) Ineffective treatments:  None tried Associated symptoms: anorexia, belching and nausea   Associated symptoms: no chest pain, no constipation, no cough, no diarrhea, no dysuria, no fever, no shortness of breath and no vomiting   Risk factors: being elderly   Risk factors: no NSAID use     Past Medical History  Diagnosis Date  . Hypertension   . Depression     hx of   . Diabetes mellitus without complication   . Chronic kidney disease     hx of kidney stones,   . Arthritis   . Hearing loss of both ears   . Tinnitus of both ears   . MRSA infection     Oct 13 - Nov 13  . Ganglion, left ankle and foot   . Osteoporosis    Past Surgical History  Procedure Laterality Date  . Total hip revision  12/05/2011    Procedure: TOTAL HIP REVISION;  Surgeon: Mcarthur Rossetti, MD;  Location: [facility];  Service: Orthopedics;  Laterality: Right;  Right Hip Revision Arthroplasty to Total Hip, Excision of Old Implant  . Incision and drainage hip  12/22/2011    Procedure: IRRIGATION AND DEBRIDEMENT HIP;  Surgeon: Mcarthur Rossetti, MD;  Location: [facility];  Service: Orthopedics;  Laterality: Right;  Irrigation and debridement right hip  . Incision and drainage hip  12/27/2011    Procedure: IRRIGATION AND DEBRIDEMENT HIP;  Surgeon: Mcarthur Rossetti, MD;  Location: [facility];  Service: Orthopedics;  Laterality: Right;  Repeat irrigation and debridement  . Joint replacement      bilateral knee, right hip   . Back surgery      history restored after error with record merge       Family History  Problem Relation Age of Onset  . Colon cancer Neg Hx   . Hypertension Mother    History  Substance Use Topics  . Smoking status: Never Smoker   . Smokeless tobacco: Never Used  . Alcohol Use: No   OB History   Grav Para Term Preterm Abortions TAB SAB Ect Mult Living                 Review of Systems  Constitutional: Negative for fever.  Respiratory: Negative for cough and shortness of breath.   Cardiovascular: Negative for chest pain.  Gastrointestinal: Positive for  nausea, abdominal pain and anorexia. Negative for vomiting, diarrhea and constipation.  Genitourinary: Positive for flank pain. Negative for dysuria.  All other systems reviewed and are negative.     Allergies  Bactrim; Rifampin; and Vancomycin  Home Medications   Prior to Admission medications   Medication Sig Start Date End Date Taking? Authorizing Provider  ACCU-CHEK SOFTCLIX LANCETS lancets Insulin dependent diabetic on sliding scale insulin   ICD 250.02 09/08/13  Yes Mary B Dixon, PA-C  calcium-vitamin D (OSCAL WITH D) 500-200 MG-UNIT per tablet Take 1 tablet by mouth daily.   Yes Historical Provider, MD  Cholecalciferol (VITAMIN D3) 400 UNITS tablet Take 400 Units by mouth daily.    Yes Historical Provider, MD  glucose blood (ACCU-CHEK AVIVA PLUS) test strip USE TO TEST BLOOD SUGAR THREE TIMES DAILY 09/08/13  Yes Orlena Sheldon, PA-C  insulin aspart (NOVOLOG) 100 UNIT/ML FlexPen Follow sliding scale as ordered 09/08/13  Yes Orlena Sheldon, PA-C  Insulin Glargine (LANTUS) 100 UNIT/ML Solostar Pen INJECT  36 UNITS AT BEDTIME. 09/07/13  Yes Susy Frizzle, MD  Insulin Pen Needle (PEN NEEDLES 31GX5/16") 31G X 8 MM MISC 1 each by Does not apply route 4 (four) times daily -  before meals and at bedtime. 09/08/13  Yes Mary B Dixon, PA-C  lisinopril (PRINIVIL,ZESTRIL) 10 MG tablet Take 1 tablet (10 mg total) by mouth daily. 09/08/13  Yes Mary B Dixon, PA-C   BP 119/51  Pulse 64  Temp(Src) 98.3 F (36.8 C)  Resp 16  SpO2 99% Physical Exam  Nursing note and vitals reviewed. Constitutional: She is oriented to person, place, and time. She appears well-developed and well-nourished. No distress.  HENT:  Head: Normocephalic and atraumatic.  Mouth/Throat: Oropharynx is clear and moist.  Eyes: Conjunctivae and EOM are normal. Pupils are equal, round, and reactive to light.  Neck: Normal range of motion. Neck supple.  Cardiovascular: Normal rate, regular rhythm and intact distal pulses.   No murmur heard. Pulmonary/Chest: Effort normal and breath sounds normal. No respiratory distress. She has no wheezes. She has no rales.  Abdominal: Soft. She exhibits no distension. There is tenderness in the right upper quadrant and epigastric area. There is no rebound and no guarding.  Musculoskeletal: Normal range of motion. She exhibits no edema and no tenderness.  Neurological: She is alert and oriented to person, place, and time.  Skin: Skin is warm and dry. No rash noted. No erythema.  Psychiatric: She has a normal mood and affect. Her behavior is normal.    ED Course  Procedures (including critical care time) Labs Review Labs Reviewed  CBC WITH DIFFERENTIAL - Abnormal; Notable for the  following:    WBC 10.9 (*)    Hemoglobin 11.6 (*)    HCT 33.7 (*)    All other components within normal limits  COMPREHENSIVE METABOLIC PANEL - Abnormal; Notable for the following:    Sodium 128 (*)    Chloride 93 (*)    GFR calc non Af Amer 55 (*)    GFR calc Af Amer 64 (*)    All other components within normal limits  URINALYSIS, ROUTINE W REFLEX MICROSCOPIC - Abnormal; Notable for the following:    Specific Gravity, Urine 1.004 (*)    Leukocytes, UA TRACE (*)    All other components within normal limits  URINE MICROSCOPIC-ADD ON - Abnormal; Notable for the following:    Bacteria, UA FEW (*)    All other components within normal limits  LIPASE,  BLOOD  I-STAT TROPOININ, ED  I-STAT CG4 LACTIC ACID, ED    Imaging Review US Abdomen Complete  12/14/2013   CLINICAL DATA:  Abdominal pain. History of hypertension, diabetes and chronic renal disease. Initial encounter.  EXAM: ULTRASOUND ABDOMEN COMPLETE  COMPARISON:  Abdominal pelvic CT 12/08/2007.  FINDINGS: Gallbladder: No gallstones or wall thickening visualized. No sonographic Murphy sign noted.  Common bile duct: Diameter: 3.4 mm.  Liver: No focal lesion identified. Within normal limits in parenchymal echogenicity.  IVC: No abnormality visualized.  Pancreas: Visualized portion unremarkable.  Spleen: Size and appearance within normal limits.  Right Kidney: Length: Approximately 10.8 cm. The right kidney demonstrates chronic cortical scarring and hydronephrosis, similar to prior CT. There is a central shadowing calculus which may be within the collecting system. This was also demonstrated on the prior CT.  Left Kidney: Length: 11.5 cm. Echogenicity within normal limits. No mass or hydronephrosis visualized.  Abdominal aorta: No aneurysm visualized.  Other findings: None.  IMPRESSION: 1. No acute abdominal findings demonstrated. 2. Stable chronic right renal cortical scarring and hydronephrosis. Central right renal calculi may be obstructing the  UPJ as demonstrated on prior abdominal CT in 2009.   Electronically Signed   By: Camie Patience M.D.   On: 12/14/2013 20:04     EKG Interpretation   Date/Time:  Wednesday December 14 2013 18:28:49 EDT Ventricular Rate:  64 PR Interval:  146 QRS Duration: 95 QT Interval:  367 QTC Calculation: 379 R Axis:   37 Text Interpretation:  Sinus rhythm Low voltage, precordial leads Abnormal  R-wave progression, early transition No significant change since last  tracing Confirmed by Maryan Rued  MD, Loree Fee ([id number]) on 12/14/2013 7:27:21  PM      MDM   Final diagnoses:  Abdominal pain, acute  Gastritis  H. pylori infection   Patient presented with epigastric burning and upper abdominal pain that persisted for the last 2 weeks. She saw her PMD and was diagnosed with H. pylori and started on appropriate antibiotic treatment as well as omeprazole. Patient just feels that she's getting worse since starting the antibiotics. Patient has no peritoneal signs on exam and normal vital signs. She does not have any respiratory or cardiac complaints. EKG without significant findings, lactate, troponin,lipase, UA, CBC, CMP all without significant findings except for hyponatremia of unknown origin. However patient had a sodium of 124 several days ago and is now improving. Ultrasound negative for cholecystitis. Feel most likely that patient is having gastritis from the new antibiotics she is taking for H. pylori. Encouraged him to continue taking the antibiotics and the omeprazole and will give Carafate as well as Zofran when necessary for nausea.  Patient will follow up with her doctor next week if symptoms are not improving. At this time do not feel that patient needs any further imaging of her abdomen as her abd exam remains benign.   Blanchie Dessert, MD 12/14/13 2146  Blanchie Dessert, MD 12/14/13 2150

## 2013-12-21 ENCOUNTER — Emergency Department (HOSPITAL_COMMUNITY)
Admission: EM | Admit: 2013-12-21 | Discharge: 2013-12-22 | Disposition: A | Discharge status: Home / Self Care | Payer: Medicaid Other | Attending: Emergency Medicine | Admitting: Emergency Medicine

## 2013-12-21 ENCOUNTER — Encounter (HOSPITAL_COMMUNITY): Payer: Self-pay | Admitting: Emergency Medicine

## 2013-12-21 DIAGNOSIS — N189 Chronic kidney disease, unspecified: Secondary | ICD-10-CM | POA: Insufficient documentation

## 2013-12-21 DIAGNOSIS — R110 Nausea: Secondary | ICD-10-CM | POA: Diagnosis not present

## 2013-12-21 DIAGNOSIS — E119 Type 2 diabetes mellitus without complications: Secondary | ICD-10-CM | POA: Diagnosis not present

## 2013-12-21 DIAGNOSIS — R109 Unspecified abdominal pain: Secondary | ICD-10-CM | POA: Diagnosis present

## 2013-12-21 DIAGNOSIS — Z794 Long term (current) use of insulin: Secondary | ICD-10-CM | POA: Insufficient documentation

## 2013-12-21 DIAGNOSIS — Z8614 Personal history of Methicillin resistant Staphylococcus aureus infection: Secondary | ICD-10-CM | POA: Diagnosis not present

## 2013-12-21 DIAGNOSIS — Z792 Long term (current) use of antibiotics: Secondary | ICD-10-CM | POA: Diagnosis not present

## 2013-12-21 DIAGNOSIS — Z79899 Other long term (current) drug therapy: Secondary | ICD-10-CM | POA: Insufficient documentation

## 2013-12-21 DIAGNOSIS — I129 Hypertensive chronic kidney disease with stage 1 through stage 4 chronic kidney disease, or unspecified chronic kidney disease: Secondary | ICD-10-CM | POA: Diagnosis not present

## 2013-12-21 DIAGNOSIS — R1013 Epigastric pain: Secondary | ICD-10-CM

## 2013-12-21 DIAGNOSIS — Z8659 Personal history of other mental and behavioral disorders: Secondary | ICD-10-CM | POA: Diagnosis not present

## 2013-12-21 DIAGNOSIS — Z8739 Personal history of other diseases of the musculoskeletal system and connective tissue: Secondary | ICD-10-CM | POA: Insufficient documentation

## 2013-12-21 DIAGNOSIS — H9193 Unspecified hearing loss, bilateral: Secondary | ICD-10-CM | POA: Insufficient documentation

## 2013-12-21 DIAGNOSIS — N390 Urinary tract infection, site not specified: Secondary | ICD-10-CM | POA: Insufficient documentation

## 2013-12-21 LAB — CBC WITH DIFFERENTIAL/PLATELET
Basophils Absolute: 0 10*3/uL (ref 0.0–0.1)
Basophils Relative: 0 % (ref 0–1)
Eosinophils Absolute: 0.2 10*3/uL (ref 0.0–0.7)
Eosinophils Relative: 2 % (ref 0–5)
HCT: 33.7 % — ABNORMAL LOW (ref 36.0–46.0)
HEMOGLOBIN: 11.2 g/dL — AB (ref 12.0–15.0)
LYMPHS PCT: 27 % (ref 12–46)
Lymphs Abs: 2.2 10*3/uL (ref 0.7–4.0)
MCH: 27.3 pg (ref 26.0–34.0)
MCHC: 33.2 g/dL (ref 30.0–36.0)
MCV: 82.2 fL (ref 78.0–100.0)
MONOS PCT: 7 % (ref 3–12)
Monocytes Absolute: 0.6 10*3/uL (ref 0.1–1.0)
NEUTROS ABS: 5.2 10*3/uL (ref 1.7–7.7)
Neutrophils Relative %: 64 % (ref 43–77)
Platelets: 177 10*3/uL (ref 150–400)
RBC: 4.1 MIL/uL (ref 3.87–5.11)
RDW: 14 % (ref 11.5–15.5)
WBC: 8.2 10*3/uL (ref 4.0–10.5)

## 2013-12-21 LAB — BASIC METABOLIC PANEL
ANION GAP: 14 (ref 5–15)
BUN: 20 mg/dL (ref 6–23)
CO2: 17 mEq/L — ABNORMAL LOW (ref 19–32)
Calcium: 8.7 mg/dL (ref 8.4–10.5)
Chloride: 95 mEq/L — ABNORMAL LOW (ref 96–112)
Creatinine, Ser: 0.9 mg/dL (ref 0.50–1.10)
GFR calc Af Amer: 70 mL/min — ABNORMAL LOW (ref >90)
GFR calc non Af Amer: 60 mL/min — ABNORMAL LOW (ref >90)
Glucose, Bld: 227 mg/dL — ABNORMAL HIGH (ref 70–99)
Potassium: 4.4 mEq/L (ref 3.7–5.3)
Sodium: 126 mEq/L — ABNORMAL LOW (ref 137–147)

## 2013-12-21 LAB — URINALYSIS, ROUTINE W REFLEX MICROSCOPIC
Bilirubin Urine: NEGATIVE
GLUCOSE, UA: NEGATIVE mg/dL
HGB URINE DIPSTICK: NEGATIVE
Ketones, ur: NEGATIVE mg/dL
Nitrite: NEGATIVE
PH: 6 (ref 5.0–8.0)
Protein, ur: NEGATIVE mg/dL
SPECIFIC GRAVITY, URINE: 1.009 (ref 1.005–1.030)
Urobilinogen, UA: 0.2 mg/dL (ref 0.0–1.0)

## 2013-12-21 LAB — CBG MONITORING, ED: GLUCOSE-CAPILLARY: 226 mg/dL — AB (ref 70–99)

## 2013-12-21 LAB — URINE MICROSCOPIC-ADD ON

## 2013-12-21 LAB — HEPATIC FUNCTION PANEL
ALT: 18 U/L (ref 0–35)
AST: 23 U/L (ref 0–37)
Albumin: 3.5 g/dL (ref 3.5–5.2)
Alkaline Phosphatase: 108 U/L (ref 39–117)
Bilirubin, Direct: <0.2 mg/dL (ref 0.0–0.3)
TOTAL PROTEIN: 6.8 g/dL (ref 6.0–8.3)
Total Bilirubin: <0.2 mg/dL — ABNORMAL LOW (ref 0.3–1.2)

## 2013-12-21 LAB — I-STAT TROPONIN, ED: TROPONIN I, POC: 0.01 ng/mL (ref 0.00–0.08)

## 2013-12-21 MED ORDER — FAMOTIDINE 20 MG PO TABS
20.0000 mg | ORAL_TABLET | Freq: Once | ORAL | Status: AC
  Administered 2013-12-21: 20 mg via ORAL
  Filled 2013-12-21: qty 1

## 2013-12-21 MED ORDER — SODIUM CHLORIDE 0.9 % IV SOLN
Freq: Once | INTRAVENOUS | Status: AC
  Administered 2013-12-22: 125 mL/h via INTRAVENOUS

## 2013-12-21 MED ORDER — SODIUM CHLORIDE 0.9 % IV BOLUS (SEPSIS)
500.0000 mL | Freq: Once | INTRAVENOUS | Status: AC
  Administered 2013-12-21: 500 mL via INTRAVENOUS

## 2013-12-21 NOTE — ED Notes (Signed)
Pt seen here on the 21st and put on antibiotics for H. Pylori. Family sts she is not getting any better. Pt reports R flank pain that started this morning. Denies urinary sx. Pt is spanish speaking. This RN spoke with interpreter (access code [phone]).

## 2013-12-21 NOTE — ED Notes (Signed)
Per interpreter: pt c/o epigastric pain and pain between the breasts. Recently discharged from ED for ulcer from infection of H.pylori. Family reports pt has been taking the medications, pain has not gotten any better. Denies urinary issues. Last BM this morning. Reports she gets an "empty feeling and hour or two after eating." Family reports getting weak after eating. Pt also c/o R sided back and neck pain. Denies emesis; reports constant nausea

## 2013-12-21 NOTE — ED Provider Notes (Signed)
CSN: [id number]     Arrival date & time 12/21/13  1938 History   First MD Initiated Contact with Patient 12/21/13 2205     Chief Complaint  Patient presents with  . Flank Pain     (Consider location/radiation/quality/duration/timing/severity/associated sxs/prior Treatment) HPI Comments: Patient recently Dx with H Pilori and started on antibiotics and Carafate plus Zofran for nausea now with persistent pain , slight nausea, decreased appepite Denies fever, SOB, CP   Patient is a 79 y.o. female presenting with flank pain. The history is provided by the patient. The history is limited by a language barrier. A language interpreter was used.  Flank Pain This is a recurrent problem. The current episode started 1 to 4 weeks ago. The problem occurs constantly. The problem has been gradually improving. Associated symptoms include abdominal pain and nausea. Pertinent negatives include no change in bowel habit, chest pain, congestion, coughing, fever, headaches, sore throat, swollen glands, vomiting or weakness. The symptoms are aggravated by eating. Treatments tried: carafate, amtibiotis. The treatment provided mild relief.    Past Medical History  Diagnosis Date  . Hypertension   . Depression     hx of   . Diabetes mellitus without complication   . Chronic kidney disease     hx of kidney stones,   . Arthritis   . Hearing loss of both ears   . Tinnitus of both ears   . MRSA infection     Oct 13 - Nov 13  . Ganglion, left ankle and foot   . Osteoporosis    Past Surgical History  Procedure Laterality Date  . Total hip revision  12/05/2011    Procedure: TOTAL HIP REVISION;  Surgeon: Mcarthur Rossetti, MD;  Location: [facility];  Service: Orthopedics;  Laterality: Right;  Right Hip Revision Arthroplasty to Total Hip, Excision of Old Implant  . Incision and drainage hip  12/22/2011    Procedure: IRRIGATION AND DEBRIDEMENT HIP;  Surgeon: Mcarthur Rossetti, MD;  Location: [facility];   Service: Orthopedics;  Laterality: Right;  Irrigation and debridement right hip  . Incision and drainage hip  12/27/2011    Procedure: IRRIGATION AND DEBRIDEMENT HIP;  Surgeon: Mcarthur Rossetti, MD;  Location: [place];  Service: Orthopedics;  Laterality: Right;  Repeat irrigation and debridement  . Joint replacement      bilateral knee, right hip   . Back surgery      history restored after error with record merge       Family History  Problem Relation Age of Onset  . Colon cancer Neg Hx   . Hypertension Mother    History  Substance Use Topics  . Smoking status: Never Smoker   . Smokeless tobacco: Never Used  . Alcohol Use: No   OB History   Grav Para Term Preterm Abortions TAB SAB Ect Mult Living                 Review of Systems  Constitutional: Negative for fever.  HENT: Negative for congestion and sore throat.   Respiratory: Negative for cough.   Cardiovascular: Negative for chest pain and leg swelling.  Gastrointestinal: Positive for nausea and abdominal pain. Negative for vomiting and change in bowel habit.  Genitourinary: Negative for dysuria and flank pain.  Neurological: Negative for dizziness, weakness and headaches.  All other systems reviewed and are negative.     Allergies  Bactrim; Rifampin; and Vancomycin  Home Medications   Prior to Admission medications  Medication Sig Start Date End Date Taking? Authorizing Provider  calcium-vitamin D (OSCAL WITH D) 500-200 MG-UNIT per tablet Take 1 tablet by mouth daily.   Yes Historical Provider, MD  Cholecalciferol (VITAMIN D3) 400 UNITS tablet Take 400 Units by mouth daily.   Yes Historical Provider, MD  insulin aspart (NOVOLOG) 100 UNIT/ML injection Inject 6 Units into the skin 3 (three) times daily before meals.   Yes Historical Provider, MD  insulin glargine (LANTUS) 100 UNIT/ML injection Inject 36 Units into the skin at bedtime.   Yes Historical Provider, MD  lansoprazole (PREVACID) 30 MG capsule Take 30  mg by mouth 2 (two) times daily before a meal.   Yes Historical Provider, MD  lisinopril (PRINIVIL,ZESTRIL) 10 MG tablet Take 1 tablet (10 mg total) by mouth daily. 09/08/13  Yes Mary B Dixon, PA-C  ondansetron (ZOFRAN) 4 MG tablet Take 1 tablet (4 mg total) by mouth every 6 (six) hours as needed for nausea or vomiting. 12/14/13  Yes Blanchie Dessert, MD  sucralfate (CARAFATE) 1 GM/10ML suspension Take 10 mLs (1 g total) by mouth 4 (four) times daily -  with meals and at bedtime. 12/14/13  Yes Blanchie Dessert, MD  ACCU-CHEK SOFTCLIX LANCETS lancets Insulin dependent diabetic on sliding scale insulin   ICD 250.02 09/08/13   Orlena Sheldon, PA-C  glucose blood (ACCU-CHEK AVIVA PLUS) test strip USE TO TEST BLOOD SUGAR THREE TIMES DAILY 09/08/13   Orlena Sheldon, PA-C  Insulin Pen Needle (PEN NEEDLES 31GX5/16") 31G X 8 MM MISC 1 each by Does not apply route 4 (four) times daily -  before meals and at bedtime. 09/08/13   Lonie Peak Dixon, PA-C  levofloxacin (LEVAQUIN) 500 MG tablet Take 1 tablet (500 mg total) by mouth daily. 12/22/13   Garald Balding, NP   BP 137/73  Pulse 60  Temp(Src) 97.7 F (36.5 C) (Oral)  Resp 15  SpO2 97% Physical Exam  Nursing note and vitals reviewed. Constitutional: She appears well-developed and well-nourished.  HENT:  Head: Normocephalic.  Eyes: Pupils are equal, round, and reactive to light.  Neck: Normal range of motion.  Cardiovascular: Normal rate and regular rhythm.   Pulmonary/Chest: Effort normal.  Abdominal: Soft. She exhibits no distension. There is no splenomegaly or hepatomegaly. There is tenderness in the epigastric area.      ED Course  Procedures (including critical care time) Labs Review Labs Reviewed  BASIC METABOLIC PANEL - Abnormal; Notable for the following:    Sodium 126 (*)    Chloride 95 (*)    CO2 17 (*)    Glucose, Bld 227 (*)    GFR calc non Af Amer 60 (*)    GFR calc Af Amer 70 (*)    All other components within normal limits  CBC WITH  DIFFERENTIAL - Abnormal; Notable for the following:    Hemoglobin 11.2 (*)    HCT 33.7 (*)    All other components within normal limits  URINALYSIS, ROUTINE W REFLEX MICROSCOPIC - Abnormal; Notable for the following:    APPearance CLOUDY (*)    Leukocytes, UA LARGE (*)    All other components within normal limits  HEPATIC FUNCTION PANEL - Abnormal; Notable for the following:    Total Bilirubin <0.2 (*)    All other components within normal limits  URINE MICROSCOPIC-ADD ON - Abnormal; Notable for the following:    Bacteria, UA MANY (*)    All other components within normal limits  CBG MONITORING, ED - Abnormal;  Notable for the following:    Glucose-Capillary 226 (*)    All other components within normal limits  I-STAT TROPOININ, ED    Imaging Review Ct Renal Stone Study  12/22/2013   CLINICAL DATA:  Right flank pain  EXAM: CT ABDOMEN AND PELVIS WITHOUT CONTRAST  TECHNIQUE: Multidetector CT imaging of the abdomen and pelvis was performed following the standard protocol without IV contrast.  COMPARISON:  12/08/2007  FINDINGS: Mild dependent atelectasis or scarring. Normal heart size. Mild coronary artery calcifications.  Organ evaluation is limited in the absence of intravenous contrast. Within this limitation, hepatic granuloma. Small gallstone. No pericholecystic fluid or fat stranding. No biliary ductal dilatation.  No appreciable abnormality of the spleen, pancreas, adrenal glands, left kidney.  The right kidney is atrophic and there are multiple right renal calculi. Right pelvocaliectasis, decreased from prior. Fullness of the right ureter into the pelvis. The UVJ is obscured by streak artifact from the patient's hip arthroplasty.  Moderate stool burden. Colonic diverticulosis. No CT evidence for colitis or diverticulitis. Normal appendix. Small bowel loops are of normal course and caliber. Tiny fat containing umbilical hernia. Small to moderate hiatal hernia.  No free intraperitoneal air or  fluid. No lymphadenopathy. The previously noted aortocaval lymph node has decreased in size, measuring sub cm short axis on today's exam.  Scattered atherosclerotic disease of the aorta and branch vessels without aneurysmal dilatation.  Thin walled bladder. Similar uterine calcifications. No overt adnexal mass.  Sequelae of prior right inferior pubic ramus fracture. Multilevel degenerative changes. Mild leftward curvature of the lumbar spine.  IMPRESSION: Chronic right renal scarring/ atrophy. Multiple nonobstructing right renal calculi. Chronic right hydroureteronephrosis (mildly decreased from the prior). The distal right ureter/ UVJ is obscured by streak artifact from the patient's hip arthroplasty, therefore intraluminal stone not excluded.   Electronically Signed   By: Carlos Levering M.D.   On: 12/22/2013 02:26     EKG Interpretation None     Again patient is hyponaturemic  Will give IV fluids, antemetic and Pepcid After IV bolus of 500 cc NS and IV Pepcid patient feeling better will try PO challenge  Patient more comfortable CT Scan reviewed Will treat with Levaquin for 5 days all other antibiotics stopped  MDM   Final diagnoses:  Flank pain  UTI (lower urinary tract infection)  Epigastric pain         Garald Balding, NP 12/22/13 0345

## 2013-12-22 ENCOUNTER — Emergency Department (HOSPITAL_COMMUNITY): Payer: Medicaid Other

## 2013-12-22 ENCOUNTER — Encounter (HOSPITAL_COMMUNITY): Payer: Self-pay

## 2013-12-22 MED ORDER — LEVOFLOXACIN 500 MG PO TABS: 500.0000 mg | ORAL_TABLET | Freq: Every day | ORAL | Status: DC

## 2013-12-22 MED ORDER — DEXTROSE 5 % IV SOLN
1.0000 g | Freq: Once | INTRAVENOUS | Status: AC
  Administered 2013-12-22: 1 g via INTRAVENOUS
  Filled 2013-12-22: qty 10

## 2013-12-22 NOTE — Discharge Instructions (Signed)
Medicamentos Antibiticos (Antibiotic Medication) Los antibiticos se encuentran entre los medicamentos ms prescritos. Sin embargo, no son de [place] en caso de resfros, gripe u otras infecciones virales. Tome slo la medicacin como le ha indicado el profesional que lo asiste. Nunca tome o administre medicamentos que son de [place] persona o medicamentos que hayan sobrado. Asegrese de comentarle a su mdico si usted:  Museum/gallery curator.  El costo del [place].  El calendario de dosificacin.  Sabor.  Efectos adversos comunes al elegir antibiticos para tratar una infeccin. Consulte con el profesional si tiene dudas acerca de la razn por la que se ha elegido determinado medicamento. INSTRUCCIONES PARA EL CUIDADO DOMICILIARIO Lea atentamente todas las instrucciones y rtulos en los envases de los medicamentos. Ciertos antibiticos deben tomarse con el [employer], mientras que otros deben tomarse junto con alimentos. Utilizar los antibiticos de forma incorrecta puede reducir su efectividad. Ciertos antibiticos deben Hospital doctor. Otros deben mantenerse a Engineer, water. Consulte con el profesional o el farmacutico si no comprende cmo debe Futures trader. Asegrese de Architectural technologist la cantidad de medicamento prescrita por el mdico. Aunque se sienta mejor y sus sntomas disminuyan, an pueden haber bacterias vivas en su cuerpo. Tomar todo el medicamento servir para prevenir que:  La infeccin vuelva y sea ms difcil de tratar.  Se presenten complicaciones debido a infecciones tratadas parcialmente. Si existen medicamentos sobrantes luego de haber tomado la cantidad [employer], trelos. Asegrese de informarle al mdico si:  Es alrgico a Environmental manager.  Est embarazada o est buscando quedar embarazada mientras est [employer].  Est amamantando.  Est tomando cualquier [employer] de prescripcin, de  [employer], o a base de plantas.  Tiene otros trastornos o problemas mdicos que no ha comentado an. Toma pldoras anticonceptivas, podran no tener efecto cuando tome antibiticos. Para evitar un embarazo no deseado:  Siga tomando las pldoras como lo hace habitualmente.  Utilice un segundo mtodo de control de la natalidad (como condones) mientras toma los antibiticos.  Cuando finalice con los antibiticos, siga con el segundo mtodo hasta que finalice el ciclo mensual de pldoras anticonceptivas. Tmelos hasta finalizarlos, aunque se sienta mejor. Trate de no saltear ninguna dosis. Si le ocurre, tmela lo antes posible. Pero si est cerca de la hora de la prxima dosis y su esquema es:  2 dosis por da, tome la dosis que ha salteado y la prxima a las 5  6 horas.  3 dosis por da, tome la dosis que ha salteado y la prxima a las 2  4 horas, o duplique la prxima dosis.Luego vuelva al esquema habitual.  Si no puede recuperar una dosis omitida, tome la prxima dosis cuando corresponda y complete la dosis omitida al final de todas las dosis prescritas. EFECTOS ADVERSOS DE LOS ANTIBITICOS [employer] efectos adversos comunes de los antibiticos se encuentran:  Materia fecal blanda o diarrea.  Malestar estomacal leve.  Sensibilidad al sol. SOLICITE ATENCIN MDICA SI:  Empeora o no mejora luego de [employer] de haber comenzado a Engineer, petroleum.  Presenta vmitos.  El beb presenta dermatitis del paal o aparece un sarpullido en los genitales.  Presenta prurito vaginal.  Aparecen manchas blancas en la lengua o en la boca.  Presenta diarrea fuerte y clicos abdominales.  Desarrolla signos de alergia (urticaria, aparece una erupcin desconocida que pica). [place]. SOLICITE ATENCIN MDICA DE INMEDIATO SI:  La orina se vuelve oscura o cambia de color  por la presencia de [place].  Presenta un tono amarillo en la piel.  Sangra o aparecen hematomas  con facilidad.  Siente dolor en las articulaciones o musculares.  La fiebre vuelve.  Siente dolor de cabeza intenso.  Desarrolla signos de [place] (problemas para respirar, sibilancias, hinchazn de los labios, la cara o la [place], [place], ampollas en la piel o la boca). [place]. Document Released: 05/20/2007 Document Revised: 05/05/2011 [facility] Patient Information 2015 [place]. This information is not intended to replace advice given to you by your health care provider. Make sure you discuss any questions you have with your health care provider.  Dolor abdominal en las mujeres (Abdominal Pain, Women) El dolor abdominal (en el estmago, la pelvis o el vientre) puede tener muchas causas. Es importante que le informe a su mdico:  La ubicacin del Social research officer, government.  Viene y se va, o persiste todo el tiempo?  Hay situaciones que English as a second language teacher (comer ciertos alimentos, la actividad fsica)?  Tiene otros sntomas asociados al dolor (fiebre, nuseas, vmitos, diarrea)? Todo es de gran ayuda cuando se trata de hallar la causa del dolor. CAUSAS  Estmago: Infecciones por virus o bacterias, o lcera.  Intestino: Apendicitis (apndice inflamado), ileitis regional (enfermedad de Crohn), colitis ulcerosa (colon inflamado), sndrome del colon irritable, diverticulitis (inflamacin de los divertculos del colon) o cncer de estmago oo intestino.  Enfermedades de la vescula biliar o clculos.  Enfermedades renales, clculos o infecciones en el rin.  Infeccin o cncer del pncreas.  Fibromialgia (trastorno doloroso)  Enfermedades de los rganos femeninos:  Uterus: tero: fibroma (tumor no canceroso) o infeccin  Trompas de Falopio: infeccin o embarazo ectpico  En los ovarios, quistes o tumores.  Adherencias plvicas (tejido cicatrizal).  Endometriosis (el tejido que cubre el tero se desarrolla en la pelvis y los rganos plvicos).  Sndrome de  Occupational psychologist (los rganos femeninos se llenan de sangre antes del periodo menstrual(  Dolor durante el periodo menstrual.  Dolor durante la ovulacin (al producir vulos).  Dolor al usar el DIU (dispositivo intrauterino para el control de la natalidad)  Programmer, systems los rganos femeninos.  Dolor funcional (no est originado en una enfermedad, puede mejorar sin tratamiento).  Dolor de origen psicolgico  Depresin. DIAGNSTICO Su mdico decidir la gravedad del dolor a travs del examen fsico  Anlisis de sangre  Radiografas  Ecografas  TC (tomografa computada, tipo especial de radiografas).  IMR (resonancia magntica)  Cultivos, en el caso una infeccin  Colon por enema de bario (se inserta una sustancia de contraste en el intestino grueso para mejorar la observacin con rayos X.)  Colonoscopa (observacin del intestino con un tubo luminoso).  Laparoscopa (examen del interior del abdomen con un tubo que tiene [employer]).  Ciruga exploratoria abdominal mayor (se observa el abdomen realizando una gran incisin). TRATAMIENTO El tratamiento depender de la causa del problema.   Muchos de estos casos pueden controlarse y tratarse en casa.  Medicamentos de venta libre indicados por el mdico.  Medicamentos con receta.  Antibiticos, en caso de infeccin  Pldoras anticonceptivas, en el caso de perodos dolorosos o dolor al ovular.  Tratamiento hormonal, para la endometriosis  Inyecciones para bloqueo nervioso selectivo.  Fisioterapia.  Antidepresivos.  Consejos por parte de un psclogo o psiquiatra.  Ciruga mayor o menor. INSTRUCCIONES PARA EL CUIDADO DOMICILIARIO  No tome ni administre laxantes a menos que se lo haya indicado su mdico.  Tome analgsicos de venta libre slo si se lo ha  indicado el profesional que lo asiste. No tome aspirina, ya que puede causar [employer] o hemorragias.  Consuma una dieta lquida (caldo o agua)  segn lo indicado por el mdico. Progrese lentamente a una dieta blanda, segn la tolerancia, si el dolor se relaciona con el estmago o el intestino.  Tenga un termmetro y tmese la temperatura varias veces al da.  Haga reposo en la cama y [place], si esto Ship broker.  Evite las relaciones sexuales, Higher education careers adviser.  Evite las situaciones estresantes.  Cumpla con las visitas y los anlisis de control, segn las indicaciones de su mdico.  Si el dolor no se [place] con los medicamentos o la [place], [place] tratar con:  Acupuntura.  Ejercicios de relajacin (yoga, meditacin).  Terapia grupal.  Psicoterapia. SOLICITE ATENCIN MDICA SI:  Nota que ciertos Writer de [place].  El tratamiento indicado para Lexicographer no Engineer, civil (consulting).  Necesita analgsicos ms fuertes.  Quiere que le retiren el DIU.  Si se siente confundido o desfalleciente.  Presenta nuseas o vmitos.  Aparece una erupcin cutnea.  Sufre efectos adversos o una reaccin alrgica debido a los medicamentos que toma. SOLICITE ATENCIN MDICA DE INMEDIATO SI:  El dolor persiste o se agrava.  Tiene fiebre.  Siente el dolor slo en algunos sectores del abdomen. Si se localiza en la zona derecha, posiblemente podra tratarse de apendicitis. En un adulto, si se localiza en la regin inferior izquierda del abdomen, podra tratarse de colitis o diverticulitis.  Hay sangre en las heces (deposiciones de color rojo brillante o negro alquitranado), con o sin vmitos.  Usted presenta sangre en la orina.  Siente escalofros con o sin fiebre.  Se desmaya. ASEGRESE QUE:   Comprende estas instrucciones.  Controlar su enfermedad.  Solicitar ayuda de inmediato si no mejora o si empeora. Document Released: 05/29/2008 Document Revised: 05/05/2011 [facility] Patient Information 2015 [place]. This information is not intended to replace advice given to you by your  health care provider. Make sure you discuss any questions you have with your health care provider. The only antibiotic you shold be taking is the new one called Levofloxacin once a day for your urinary tract infection

## 2013-12-22 NOTE — ED Notes (Signed)
Pt ambulatory to restroom with steady gait; pt also given water to drink for fluid challenge

## 2013-12-22 NOTE — ED Provider Notes (Signed)
Medical screening examination/treatment/procedure(s) were performed by non-physician practitioner and as supervising physician I was immediately available for consultation/collaboration.   EKG Interpretation None         Blanchie Dessert, MD 12/22/13 [phone]

## 2013-12-25 ENCOUNTER — Encounter (HOSPITAL_COMMUNITY): Payer: Self-pay | Admitting: *Deleted

## 2013-12-25 ENCOUNTER — Emergency Department (HOSPITAL_COMMUNITY)
Admission: EM | Admit: 2013-12-25 | Discharge: 2013-12-25 | Disposition: A | Discharge status: Home / Self Care | Payer: Medicaid Other | Attending: Emergency Medicine | Admitting: Emergency Medicine

## 2013-12-25 ENCOUNTER — Emergency Department (HOSPITAL_COMMUNITY): Payer: Medicaid Other

## 2013-12-25 DIAGNOSIS — R110 Nausea: Secondary | ICD-10-CM | POA: Insufficient documentation

## 2013-12-25 DIAGNOSIS — N189 Chronic kidney disease, unspecified: Secondary | ICD-10-CM | POA: Insufficient documentation

## 2013-12-25 DIAGNOSIS — R1013 Epigastric pain: Secondary | ICD-10-CM | POA: Diagnosis not present

## 2013-12-25 DIAGNOSIS — I129 Hypertensive chronic kidney disease with stage 1 through stage 4 chronic kidney disease, or unspecified chronic kidney disease: Secondary | ICD-10-CM | POA: Diagnosis not present

## 2013-12-25 DIAGNOSIS — N1330 Unspecified hydronephrosis: Secondary | ICD-10-CM | POA: Diagnosis not present

## 2013-12-25 DIAGNOSIS — Z794 Long term (current) use of insulin: Secondary | ICD-10-CM | POA: Diagnosis not present

## 2013-12-25 DIAGNOSIS — Z8614 Personal history of Methicillin resistant Staphylococcus aureus infection: Secondary | ICD-10-CM | POA: Diagnosis not present

## 2013-12-25 DIAGNOSIS — R109 Unspecified abdominal pain: Secondary | ICD-10-CM

## 2013-12-25 DIAGNOSIS — E119 Type 2 diabetes mellitus without complications: Secondary | ICD-10-CM | POA: Diagnosis not present

## 2013-12-25 DIAGNOSIS — Z79899 Other long term (current) drug therapy: Secondary | ICD-10-CM | POA: Diagnosis not present

## 2013-12-25 DIAGNOSIS — Z87442 Personal history of urinary calculi: Secondary | ICD-10-CM | POA: Diagnosis not present

## 2013-12-25 DIAGNOSIS — M810 Age-related osteoporosis without current pathological fracture: Secondary | ICD-10-CM | POA: Insufficient documentation

## 2013-12-25 DIAGNOSIS — M1990 Unspecified osteoarthritis, unspecified site: Secondary | ICD-10-CM | POA: Diagnosis not present

## 2013-12-25 DIAGNOSIS — Z792 Long term (current) use of antibiotics: Secondary | ICD-10-CM | POA: Diagnosis not present

## 2013-12-25 DIAGNOSIS — Z8659 Personal history of other mental and behavioral disorders: Secondary | ICD-10-CM | POA: Insufficient documentation

## 2013-12-25 DIAGNOSIS — H9193 Unspecified hearing loss, bilateral: Secondary | ICD-10-CM | POA: Insufficient documentation

## 2013-12-25 LAB — CBC WITH DIFFERENTIAL/PLATELET
Basophils Absolute: 0 10*3/uL (ref 0.0–0.1)
Basophils Relative: 0 % (ref 0–1)
EOS ABS: 0.1 10*3/uL (ref 0.0–0.7)
Eosinophils Relative: 2 % (ref 0–5)
HCT: 33.2 % — ABNORMAL LOW (ref 36.0–46.0)
Hemoglobin: 11.3 g/dL — ABNORMAL LOW (ref 12.0–15.0)
LYMPHS ABS: 1.8 10*3/uL (ref 0.7–4.0)
LYMPHS PCT: 24 % (ref 12–46)
MCH: 27.8 pg (ref 26.0–34.0)
MCHC: 34 g/dL (ref 30.0–36.0)
MCV: 81.6 fL (ref 78.0–100.0)
Monocytes Absolute: 0.8 10*3/uL (ref 0.1–1.0)
Monocytes Relative: 10 % (ref 3–12)
NEUTROS ABS: 4.8 10*3/uL (ref 1.7–7.7)
NEUTROS PCT: 64 % (ref 43–77)
Platelets: 156 10*3/uL (ref 150–400)
RBC: 4.07 MIL/uL (ref 3.87–5.11)
RDW: 13.6 % (ref 11.5–15.5)
WBC: 7.4 10*3/uL (ref 4.0–10.5)

## 2013-12-25 LAB — URINALYSIS, ROUTINE W REFLEX MICROSCOPIC
Bilirubin Urine: NEGATIVE
Glucose, UA: NEGATIVE mg/dL
HGB URINE DIPSTICK: NEGATIVE
Ketones, ur: NEGATIVE mg/dL
Leukocytes, UA: NEGATIVE
Nitrite: NEGATIVE
Protein, ur: NEGATIVE mg/dL
SPECIFIC GRAVITY, URINE: 1.006 (ref 1.005–1.030)
Urobilinogen, UA: 0.2 mg/dL (ref 0.0–1.0)
pH: 6.5 (ref 5.0–8.0)

## 2013-12-25 LAB — COMPREHENSIVE METABOLIC PANEL
ALK PHOS: 68 U/L (ref 39–117)
ALT: 14 U/L (ref 0–35)
AST: 24 U/L (ref 0–37)
Albumin: 3.4 g/dL — ABNORMAL LOW (ref 3.5–5.2)
Anion gap: 14 (ref 5–15)
BUN: 20 mg/dL (ref 6–23)
CHLORIDE: 90 meq/L — AB (ref 96–112)
CO2: 20 mEq/L (ref 19–32)
Calcium: 9.2 mg/dL (ref 8.4–10.5)
Creatinine, Ser: 0.84 mg/dL (ref 0.50–1.10)
GFR calc non Af Amer: 65 mL/min — ABNORMAL LOW (ref >90)
GFR, EST AFRICAN AMERICAN: 76 mL/min — AB (ref >90)
GLUCOSE: 94 mg/dL (ref 70–99)
POTASSIUM: 4.8 meq/L (ref 3.7–5.3)
SODIUM: 124 meq/L — AB (ref 137–147)
TOTAL PROTEIN: 7.1 g/dL (ref 6.0–8.3)
Total Bilirubin: 0.2 mg/dL — ABNORMAL LOW (ref 0.3–1.2)

## 2013-12-25 LAB — LIPASE, BLOOD: Lipase: 42 U/L (ref 11–59)

## 2013-12-25 MED ORDER — SUCRALFATE 1 G PO TABS: 1.0000 g | ORAL_TABLET | Freq: Four times a day (QID) | ORAL | Status: DC

## 2013-12-25 MED ORDER — MORPHINE SULFATE 4 MG/ML IJ SOLN
4.0000 mg | INTRAMUSCULAR | Status: DC | PRN
  Administered 2013-12-25: 4 mg via INTRAVENOUS
  Filled 2013-12-25: qty 1

## 2013-12-25 MED ORDER — ONDANSETRON HCL 4 MG/2ML IJ SOLN
4.0000 mg | Freq: Once | INTRAMUSCULAR | Status: AC
  Administered 2013-12-25: 4 mg via INTRAVENOUS
  Filled 2013-12-25: qty 2

## 2013-12-25 MED ORDER — PANTOPRAZOLE SODIUM 40 MG IV SOLR
40.0000 mg | Freq: Once | INTRAVENOUS | Status: AC
  Administered 2013-12-25: 40 mg via INTRAVENOUS
  Filled 2013-12-25: qty 40

## 2013-12-25 MED ORDER — OMEPRAZOLE 20 MG PO CPDR: 20.0000 mg | DELAYED_RELEASE_CAPSULE | Freq: Two times a day (BID) | ORAL | Status: DC

## 2013-12-25 MED ORDER — HYDROCODONE-ACETAMINOPHEN 5-325 MG PO TABS: 2.0000 | ORAL_TABLET | ORAL | Status: DC | PRN

## 2013-12-25 NOTE — ED Provider Notes (Signed)
CSN: [id number]     Arrival date & time 12/25/13  1539 History   First MD Initiated Contact with Patient 12/25/13 1542     Chief Complaint  Patient presents with  . Abdominal Pain     HPI  Patient presents with her daughter, and granddaughter from home. Seen on the 20th, and then on the 29th of abdominal pain. Had a normal right upper quadrant ultrasound on the 20th. His CT stone protocol the 29th. Has chronic right hydronephrosis. Had urinalysis findings consistent with urinary tract infection. No culture obtained. She wasdischarged on Levaquin.  She states her primary region of pain is in her right flank. It hurts to move. Hurts to cough, hurts to breathe. Also states that she has epigastric pain. She feels full very quickly after eating. No nausea or emesis. Her appetite is poor. Normal bowel movements. No dysuria or frequency. No gross hematuria. Has been compliant with Protonix, 10 days, as well as Levaquin 5 days.  Past Medical History  Diagnosis Date  . Hypertension   . Depression     hx of   . Diabetes mellitus without complication   . Chronic kidney disease     hx of kidney stones,   . Arthritis   . Hearing loss of both ears   . Tinnitus of both ears   . MRSA infection     Oct 13 - Nov 13  . Ganglion, left ankle and foot   . Osteoporosis    Past Surgical History  Procedure Laterality Date  . Total hip revision  12/05/2011    Procedure: TOTAL HIP REVISION;  Surgeon: Mcarthur Rossetti, MD;  Location: [facility];  Service: Orthopedics;  Laterality: Right;  Right Hip Revision Arthroplasty to Total Hip, Excision of Old Implant  . Incision and drainage hip  12/22/2011    Procedure: IRRIGATION AND DEBRIDEMENT HIP;  Surgeon: Mcarthur Rossetti, MD;  Location: [facility];  Service: Orthopedics;  Laterality: Right;  Irrigation and debridement right hip  . Incision and drainage hip  12/27/2011    Procedure: IRRIGATION AND DEBRIDEMENT HIP;  Surgeon: Mcarthur Rossetti, MD;   Location: [place];  Service: Orthopedics;  Laterality: Right;  Repeat irrigation and debridement  . Joint replacement      bilateral knee, right hip   . Back surgery      history restored after error with record merge       Family History  Problem Relation Age of Onset  . Colon cancer Neg Hx   . Hypertension Mother    History  Substance Use Topics  . Smoking status: Never Smoker   . Smokeless tobacco: Never Used  . Alcohol Use: No   OB History    No data available     Review of Systems  Constitutional: Negative for fever, chills, diaphoresis, appetite change and fatigue.  HENT: Negative for mouth sores, sore throat and trouble swallowing.   Eyes: Negative for visual disturbance.  Respiratory: Negative for cough, chest tightness, shortness of breath and wheezing.   Cardiovascular: Negative for chest pain.  Gastrointestinal: Positive for nausea and abdominal distention. Negative for vomiting, abdominal pain and diarrhea.       Early satiety.  Endocrine: Negative for polydipsia, polyphagia and polyuria.  Genitourinary: Positive for flank pain. Negative for dysuria, frequency and hematuria.  Musculoskeletal: Negative for gait problem.  Skin: Negative for color change, pallor and rash.  Neurological: Negative for dizziness, syncope, light-headedness and headaches.  Hematological: Does not bruise/bleed easily.  Psychiatric/Behavioral: Negative for behavioral problems and confusion.      Allergies  Bactrim; Rifampin; and Vancomycin  Home Medications   Prior to Admission medications   Medication Sig Start Date End Date Taking? Authorizing Provider  calcium-vitamin D (OSCAL WITH D) 500-200 MG-UNIT per tablet Take 1 tablet by mouth daily.   Yes Historical Provider, MD  Cholecalciferol (VITAMIN D3) 400 UNITS tablet Take 400 Units by mouth daily.   Yes Historical Provider, MD  insulin aspart (NOVOLOG) 100 UNIT/ML injection Inject 6 Units into the skin 3 (three) times daily before  meals.   Yes Historical Provider, MD  insulin glargine (LANTUS) 100 UNIT/ML injection Inject 36 Units into the skin at bedtime.   Yes Historical Provider, MD  lansoprazole (PREVACID) 30 MG capsule Take 30 mg by mouth 2 (two) times daily before a meal.   Yes Historical Provider, MD  levofloxacin (LEVAQUIN) 500 MG tablet Take 1 tablet (500 mg total) by mouth daily. 12/22/13  Yes Garald Balding, NP  lisinopril (PRINIVIL,ZESTRIL) 10 MG tablet Take 1 tablet (10 mg total) by mouth daily. 09/08/13  Yes Mary B Dixon, PA-C  ondansetron (ZOFRAN) 4 MG tablet Take 1 tablet (4 mg total) by mouth every 6 (six) hours as needed for nausea or vomiting. 12/14/13  Yes Blanchie Dessert, MD  sucralfate (CARAFATE) 1 GM/10ML suspension Take 10 mLs (1 g total) by mouth 4 (four) times daily -  with meals and at bedtime. 12/14/13  Yes Blanchie Dessert, MD  HYDROcodone-acetaminophen (NORCO/VICODIN) 5-325 MG per tablet Take 2 tablets by mouth every 4 (four) hours as needed. 12/25/13   Tanna Furry, MD  omeprazole (PRILOSEC) 20 MG capsule Take 1 capsule (20 mg total) by mouth 2 (two) times daily. 12/25/13   Tanna Furry, MD  sucralfate (CARAFATE) 1 G tablet Take 1 tablet (1 g total) by mouth 4 (four) times daily. 12/25/13   Tanna Furry, MD   BP 154/59 mmHg  Pulse 65  Temp(Src) 99 F (37.2 C) (Oral)  Resp 18  SpO2 100% Physical Exam  Constitutional: She is oriented to person, place, and time. She appears well-developed and well-nourished. No distress.  HENT:  Head: Normocephalic.  Eyes: Conjunctivae are normal. Pupils are equal, round, and reactive to light. No scleral icterus.  Neck: Normal range of motion. Neck supple. No thyromegaly present.  Cardiovascular: Normal rate and regular rhythm.  Exam reveals no gallop and no friction rub.   No murmur heard. Pulmonary/Chest: Effort normal and breath sounds normal. No respiratory distress. She has no wheezes. She has no rales.    Abdominal: Soft. Bowel sounds are normal. She  exhibits no distension. There is no tenderness. There is no rebound.    Musculoskeletal: Normal range of motion.  Neurological: She is alert and oriented to person, place, and time.  Skin: Skin is warm and dry. No rash noted.  Psychiatric: She has a normal mood and affect. Her behavior is normal.    ED Course  Procedures (including critical care time) Labs Review Labs Reviewed  CBC WITH DIFFERENTIAL - Abnormal; Notable for the following:    Hemoglobin 11.3 (*)    HCT 33.2 (*)    All other components within normal limits  COMPREHENSIVE METABOLIC PANEL - Abnormal; Notable for the following:    Sodium 124 (*)    Chloride 90 (*)    Albumin 3.4 (*)    Total Bilirubin 0.2 (*)    GFR calc non Af Amer 65 (*)    GFR calc Af  Amer 29 (*)    All other components within normal limits  URINE CULTURE  LIPASE, BLOOD  URINALYSIS, ROUTINE W REFLEX MICROSCOPIC    Imaging Review Dg Chest 2 View  12/25/2013   CLINICAL DATA:  Abdominal pain  EXAM: CHEST  2 VIEW  COMPARISON:  03/13/2012  FINDINGS: The heart size and mediastinal contours are within normal limits. Both lungs are clear. The visualized skeletal structures are unremarkable.  IMPRESSION: No active cardiopulmonary disease.   Electronically Signed   By: Franchot Gallo M.D.   On: 12/25/2013 17:01     EKG Interpretation None      MDM   Final diagnoses:  Abdominal pain    Has chronic right hydronephrosis. CT could not rule out distal stone due to artifact from hip prosthesis. Symptoms have been several weeks. They're constant, not colicky or intermittent. I doubt ureteral stone. We'll recheck urine. Obtain culture. Repeat hepatobiliary and pancreatic enzymes. At this point I don't feel imaging is necessary she has had CT and ultrasound within 10 days.  No elevation of blood cell count or change in hepatobiliary enzymes. Symptoms most consistent with gastritis. However, symptomatically for 2 weeks despite 10 days of PPIs. GI referral  given. I'll add Carafate. Vicodin when necessary. Small frequent meals.    Tanna Furry, MD 12/25/13 [phone]

## 2013-12-25 NOTE — ED Notes (Signed)
Pt is here for evaluation of worsening abdominal pain and nausea.  Pt is on antibiotics.  Pain is most severe with deep breaths in right ribs and back.  Pt is on antibiotic for what family says is a "bacterial infection"  Pt is spanish speaking, family translating

## 2013-12-25 NOTE — ED Notes (Signed)
Bed: WA15 Expected date:  Expected time:  Means of arrival:  Comments: 

## 2013-12-25 NOTE — Discharge Instructions (Signed)
Dolor abdominal en las mujeres °(Abdominal Pain, Women) °El dolor abdominal (en el estómago, la pelvis o el vientre) puede tener muchas causas. Es importante que le informe a su médico: °· La ubicación del dolor. °· ¿Viene y se va, o persiste todo el tiempo? °· ¿Hay situaciones que inician el dolor (comer ciertos alimentos, la actividad física)? °· ¿Tiene otros síntomas asociados al dolor (fiebre, náuseas, vómitos, diarrea)? °Todo es de gran ayuda cuando se trata de hallar la causa del dolor. °CAUSAS °· Estómago: Infecciones por virus o bacterias, o úlcera. °· Intestino: Apendicitis (apéndice inflamado), ileitis regional (enfermedad de Crohn), colitis ulcerosa (colon inflamado), síndrome del colon irritable, diverticulitis (inflamación de los divertículos del colon) o cáncer de estómago oo intestino. °· Enfermedades de la vesícula biliar o cálculos. °· Enfermedades renales, cálculos o infecciones en el riñón. °· Infección o cáncer del páncreas. °· Fibromialgia (trastorno doloroso) °· Enfermedades de los órganos femeninos: °¨ Uterus: Útero: fibroma (tumor no canceroso) o infección °¨ Trompas de Falopio: infección o embarazo ectópico °¨ En los ovarios, quistes o tumores. °¨ Adherencias pélvicas (tejido cicatrizal). °¨ Endometriosis (el tejido que cubre el útero se desarrolla en la pelvis y los órganos pélvicos). °¨ Síndrome de congestión pélvica (los órganos femeninos se llenan de sangre antes del periodo menstrual( °¨ Dolor durante el periodo menstrual. °¨ Dolor durante la ovulación (al producir óvulos). °¨ Dolor al usar el DIU (dispositivo intrauterino para el control de la natalidad) °¨ Cáncer en los órganos femeninos. °· Dolor funcional (no está originado en una enfermedad, puede mejorar sin tratamiento). °· Dolor de origen psicológico °· Depresión. °DIAGNÓSTICO °Su médico decidirá la gravedad del dolor a través del examen físico °· Análisis de sangre °· Radiografías °· Ecografías °· TC (tomografía computada, tipo  especial de radiografías). °· IMR (resonancia magnética) °· Cultivos, en el caso una infección °· Colon por enema de bario (se inserta una sustancia de contraste en el intestino grueso para mejorar la observación con rayos X.) °· Colonoscopía (observación del intestino con un tubo luminoso). °· Laparoscopía (examen del interior del abdomen con un tubo que tiene una luz). °· Cirugía exploratoria abdominal mayor (se observa el abdomen realizando una gran incisión). °TRATAMIENTO °El tratamiento dependerá de la causa del problema.  °· Muchos de estos casos pueden controlarse y tratarse en casa. °· Medicamentos de venta libre indicados por el médico. °· Medicamentos con receta. °· Antibióticos, en caso de infección °· Píldoras anticonceptivas, en el caso de períodos dolorosos o dolor al ovular. °· Tratamiento hormonal, para la endometriosis °· Inyecciones para bloqueo nervioso selectivo. °· Fisioterapia. °· Antidepresivos. °· Consejos por parte de un psícólogo o psiquiatra. °· Cirugía mayor o menor. °INSTRUCCIONES PARA EL CUIDADO DOMICILIARIO °· No tome ni administre laxantes a menos que se lo haya indicado su médico. °· Tome analgésicos de venta libre sólo si se lo ha indicado el profesional que lo asiste. No tome aspirina, ya que puede causar molestias en el estómago o hemorragias. °· Consuma una dieta líquida (caldo o agua) según lo indicado por el médico. Progrese lentamente a una dieta blanda, según la tolerancia, si el dolor se relaciona con el estómago o el intestino. °· Tenga un termómetro y tómese la temperatura varias veces al día. °· Haga reposo en la cama y duerma, si esto alivia el dolor. °· Evite las relaciones sexuales, si le producen dolor. °· Evite las situaciones estresantes. °· Cumpla con las visitas y los análisis de control, según las indicaciones de su médico. °· Si el dolor   no se alivia con los medicamentos o la cirugía, puede tratar con: °¨ Acupuntura. °¨ Ejercicios de relajación (yoga,  meditación). °¨ Terapia grupal. °¨ Psicoterapia. °SOLICITE ATENCIÓN MÉDICA SI: °· Nota que ciertos alimentos le producen dolor de estómago. °· El tratamiento indicado para realizar en el hogar no le alivia el dolor. °· Necesita analgésicos más fuertes. °· Quiere que le retiren el DIU. °· Si se siente confundido o desfalleciente. °· Presenta náuseas o vómitos. °· Aparece una erupción cutánea. °· Sufre efectos adversos o una reacción alérgica debido a los medicamentos que toma. °SOLICITE ATENCIÓN MÉDICA DE INMEDIATO SI: °· El dolor persiste o se agrava. °· Tiene fiebre. °· Siente el dolor sólo en algunos sectores del abdomen. Si se localiza en la zona derecha, posiblemente podría tratarse de apendicitis. En un adulto, si se localiza en la región inferior izquierda del abdomen, podría tratarse de colitis o diverticulitis. °· Hay sangre en las heces (deposiciones de color rojo brillante o negro alquitranado), con o sin vómitos. °· Usted presenta sangre en la orina. °· Siente escalofríos con o sin fiebre. °· Se desmaya. °ASEGÚRESE QUE:  °· Comprende estas instrucciones. °· Controlará su enfermedad. °· Solicitará ayuda de inmediato si no mejora o si empeora. °Document Released: 05/29/2008 Document Revised: 05/05/2011 °[employer]® Patient Information ©2015 [employer]. This information is not intended to replace advice given to you by your health care provider. Make sure you discuss any questions you have with your health care provider. ° °

## 2013-12-26 ENCOUNTER — Telehealth: Payer: Self-pay | Admitting: Physician Assistant

## 2013-12-26 NOTE — Telephone Encounter (Signed)
Patients niece calling to speak with you regarding a referral  i could not understand what she needed  Please call her at [phone] Her name is Joyce Gilmore

## 2013-12-27 LAB — URINE CULTURE
Colony Count: NO GROWTH
Culture: NO GROWTH

## 2013-12-28 ENCOUNTER — Ambulatory Visit (INDEPENDENT_AMBULATORY_CARE_PROVIDER_SITE_OTHER): Payer: Medicaid Other | Admitting: Physician Assistant

## 2013-12-28 ENCOUNTER — Encounter: Payer: Self-pay | Admitting: Physician Assistant

## 2013-12-28 ENCOUNTER — Other Ambulatory Visit: Payer: Self-pay | Admitting: Family Medicine

## 2013-12-28 ENCOUNTER — Ambulatory Visit
Admission: RE | Admit: 2013-12-28 | Discharge: 2013-12-28 | Disposition: A | Discharge status: Home / Self Care | Payer: Medicaid Other | Source: Ambulatory Visit | Attending: Physician Assistant | Admitting: Physician Assistant

## 2013-12-28 VITALS — BP 136/66 | HR 72 | Temp 98.3°F | Resp 20 | Wt 156.0 lb

## 2013-12-28 DIAGNOSIS — R630 Anorexia: Secondary | ICD-10-CM

## 2013-12-28 DIAGNOSIS — M546 Pain in thoracic spine: Secondary | ICD-10-CM

## 2013-12-28 DIAGNOSIS — R110 Nausea: Secondary | ICD-10-CM

## 2013-12-28 DIAGNOSIS — N200 Calculus of kidney: Secondary | ICD-10-CM

## 2013-12-28 DIAGNOSIS — G4700 Insomnia, unspecified: Secondary | ICD-10-CM | POA: Insufficient documentation

## 2013-12-28 MED ORDER — TEMAZEPAM 15 MG PO CAPS: 15.0000 mg | ORAL_CAPSULE | Freq: Every evening | ORAL | Status: DC | PRN

## 2013-12-28 MED ORDER — LANSOPRAZOLE 30 MG PO CPDR: 30.0000 mg | DELAYED_RELEASE_CAPSULE | Freq: Two times a day (BID) | ORAL | Status: DC

## 2013-12-28 MED ORDER — INSULIN ASPART 100 UNIT/ML ~~LOC~~ SOLN: 6.0000 [IU] | Freq: Three times a day (TID) | SUBCUTANEOUS | Status: DC

## 2013-12-28 MED ORDER — ONDANSETRON HCL 4 MG PO TABS: 4.0000 mg | ORAL_TABLET | Freq: Four times a day (QID) | ORAL | Status: DC | PRN

## 2013-12-28 MED ORDER — HYDROCODONE-ACETAMINOPHEN 5-325 MG PO TABS: 1.0000 | ORAL_TABLET | ORAL | Status: DC | PRN

## 2013-12-28 MED ORDER — INSULIN GLARGINE 100 UNIT/ML ~~LOC~~ SOLN: 36.0000 [IU] | Freq: Every day | SUBCUTANEOUS | Status: DC

## 2013-12-28 NOTE — Progress Notes (Signed)
Patient ID: GENOVEVA SINGLETON MRN: [id number], DOB: 1934-05-08, 79 y.o. Date of Encounter: @DATE @  Chief Complaint:  Chief Complaint  Patient presents with  . hosp follow up    still with abd pain, and now back pain, RF insulin, Zofran, Lansoprazole and pain meds    HPI: 79 y.o. year old Hispanic female  presents with one of her sons (who has come with her to prior visits), another family member, and the [facility] interpreter.  She had an office visit with me on 12/06/13 at which time she was having epigastric abdominal pain. Labs came back positive for H pylori and she was treated with appropriate H pylori medications including antibiotics and PPI.  Since then she has had 3 ER visits all of which I have reviewed today.  12/14/13 she went to the ER with complaints of epigastric pain. Gallbladder ultrasound was negative. It was felt that she was experiencing gastritis and that her symptoms were secondary to the antibiotics used to treat the H. Pylori.  12/21/13 she went to the ER complaining of epigastric pain, nausea, and decreased appetite. Urinalysis indicated UTI. CT renal stone study was performed. This showed chronic right renal scarring/atrophy. Multiple nonobstructing right renal calculi. Chronic right hydroureter nephrosis (mildly decreased from prior). Distal right ureter/UVJ was obscured by streak artifact from patient's hip arthroplasty therefore intraluminal stone could not be excluded. She was treated with IV bolus of normal saline, IV Pepcid and by mouth challenge. She was treated with Levaquin for 5 days.  12/25/13 she went to the ER complaining of pain in the right back at approximate T8 level. She reported that it hurt to move and it hurt to cough and it hurt to breathe. She also reported epigastric pain and reported that she was feeling full very quickly after eating. Reported decreased appetite. No nausea or emesis. Reported that she was being compliant with her protonix and  was also taking the Levaquin prescribed at the last ER visit. Chest x-ray was negative. That ER provider reviewed the results of recent CT scan and ultrasound test. He did not feel that her pain was coming from ureteral kidney stone. The symptoms have been constant. Symptoms had not been colicky or intermittent. They did repeat urinalysis and hepatobiliary and pancreatic enzymes. No change in these hepatobiliary enzymes and there was no elevation of white blood count. They did not feel that repeat imaging was necessary as she had had CT and ultrasound within the past 10 days. It was felt the symptoms were consistent with gastritis. Their note states that given continued epigastric discomfort despite 10 days of PPI that they would do GI referral. However today pt's son reports that they have not actually been told a GI referral. ER did prescribe some hydrocodone to use when necessary pain. They were prescribed 5/325 strength #10. The bottle is already empty so they are here to get refills. Patient states that she has been taking 2 pills at the time and really has not even tried taking just 1 at the time to know whether that would control her pain or not.  Today she reports that she is now having no epigastric pain. Says that she does have some nausea and can still only eats small amounts. Today she is mostly complaining of pain in her right back at about T8 level. She is sitting up on the exam table but is kind of moaning at times throughout the visit secondary to pain in her right back.  Family also reports that she is getting very little sleep. Cannot sleep at night. Even with pain meds. Wants something else to help with sleep.  No other complaints today. No dysuria frequency or urgency and no fevers or chills.   Past Medical History  Diagnosis Date  . Hypertension   . Depression     hx of   . Diabetes mellitus without complication   . Chronic kidney disease     hx of kidney stones,   .  Arthritis   . Hearing loss of both ears   . Tinnitus of both ears   . MRSA infection     Oct 13 - Nov 13  . Ganglion, left ankle and foot   . Osteoporosis      Home Meds: Outpatient Prescriptions Prior to Visit  Medication Sig Dispense Refill  . sucralfate (CARAFATE) 1 G tablet Take 1 tablet (1 g total) by mouth 4 (four) times daily. 60 tablet 0  . HYDROcodone-acetaminophen (NORCO/VICODIN) 5-325 MG per tablet Take 2 tablets by mouth every 4 (four) hours as needed. 10 tablet 0  . insulin aspart (NOVOLOG) 100 UNIT/ML injection Inject 6 Units into the skin 3 (three) times daily before meals.    . insulin glargine (LANTUS) 100 UNIT/ML injection Inject 36 Units into the skin at bedtime.    . ondansetron (ZOFRAN) 4 MG tablet Take 1 tablet (4 mg total) by mouth every 6 (six) hours as needed for nausea or vomiting. 12 tablet 0  . calcium-vitamin D (OSCAL WITH D) 500-200 MG-UNIT per tablet Take 1 tablet by mouth daily.    . Cholecalciferol (VITAMIN D3) 400 UNITS tablet Take 400 Units by mouth daily.    Marland Kitchen levofloxacin (LEVAQUIN) 500 MG tablet Take 1 tablet (500 mg total) by mouth daily. 7 tablet 0  . lisinopril (PRINIVIL,ZESTRIL) 10 MG tablet Take 1 tablet (10 mg total) by mouth daily. 90 tablet 3  . omeprazole (PRILOSEC) 20 MG capsule Take 1 capsule (20 mg total) by mouth 2 (two) times daily. 60 capsule 1  . lansoprazole (PREVACID) 30 MG capsule Take 30 mg by mouth 2 (two) times daily before a meal.    . sucralfate (CARAFATE) 1 GM/10ML suspension Take 10 mLs (1 g total) by mouth 4 (four) times daily -  with meals and at bedtime. 420 mL 0   No facility-administered medications prior to visit.    Allergies:  Allergies  Allergen Reactions  . Bactrim [Sulfamethoxazole-Trimethoprim]     Hyperkalemia  . Rifampin Other (See Comments)    Severe thrombocytopenia  . Vancomycin Other (See Comments)    Severe thrombocytopenia    History   Social History  . Marital Status: Widowed    Spouse  Name: N/A    Number of Children: N/A  . Years of Education: N/A   Occupational History  . Not on file.   Social History Main Topics  . Smoking status: Never Smoker   . Smokeless tobacco: Never Used  . Alcohol Use: No  . Drug Use: No  . Sexual Activity: No   Other Topics Concern  . Not on file   Social History Narrative   ** Merged History Encounter **        Family History  Problem Relation Age of Onset  . Colon cancer Neg Hx   . Hypertension Mother      Review of Systems:  See HPI for pertinent ROS. All other ROS negative.    Physical Exam: Blood pressure 136/66, pulse  72, temperature 98.3 F (36.8 C), temperature source Oral, resp. rate 20, weight 156 lb (70.761 kg)., Body mass index is 33.75 kg/(m^2). General: Hispanic female. Appears in no acute distress but does moan/ grunt off and on secondary to pain in her right back.  Lungs: Clear bilaterally to auscultation without wheezes, rales, or rhonchi. Breathing is unlabored. Heart: RRR with S1 S2. No murmurs, rubs, or gallops. Abdomen: Soft, non-tender, non-distended with normoactive bowel sounds. No hepatomegaly. No rebound/guarding. No obvious abdominal masses. Musculoskeletal:  Strength and tone normal for age. Tenderness with palpation Right Side > Left Side, at appro T8 level. No rash, no vessicles.  Extremities/Skin: Warm and dry. No rashes . Neuro: Alert and oriented X 3. Moves all extremities spontaneously. Gait is normal. CNII-XII grossly in tact. Psych:  Responds to questions appropriately with a normal affect.     ASSESSMENT AND PLAN:  79 y.o. year old female with  1. Nausea without vomiting - Ambulatory referral to Gastroenterology  2. Anorexia - Ambulatory referral to Gastroenterology  3. Right-sided thoracic back pain - DG Thoracic Spine W/Swimmers; Future - Ambulatory referral to Urology - HYDROcodone-acetaminophen (NORCO/VICODIN) 5-325 MG per tablet; Take 1-2 tablets by mouth every 4 (four)  hours as needed. For pain  Dispense: 60 tablet; Refill: 0  4. Renal calculi - Ambulatory referral to Urology  5. Insomnia - temazepam (RESTORIL) 15 MG capsule; Take 1 capsule (15 mg total) by mouth at bedtime as needed for sleep.  Dispense: 30 capsule; Refill: 0   Signed, [address] [place], [place], [facility] 12/28/2013 4:58 PM

## 2013-12-28 NOTE — Telephone Encounter (Signed)
Medication refilled per protocol. 

## 2013-12-29 ENCOUNTER — Telehealth: Payer: Self-pay | Admitting: Family Medicine

## 2013-12-29 NOTE — Telephone Encounter (Signed)
lmtrc to [place] pt daughter

## 2013-12-29 NOTE — Telephone Encounter (Signed)
Family made aware of provider directives

## 2013-12-29 NOTE — Telephone Encounter (Signed)
-----   Message from Orlena Sheldon, PA-C sent at 12/28/2013  4:45 PM EST ----- Patient's pain is at this level--  At level of T8. Therefore I do think that her pain is coming from this compression fracture. Tell them for her to use the hydrocodone that I prescribed at the visit for pain control. There is no other treatment that needs to be done. Pain should decrease over the next 1-2 weeks. Now that we have this answer, we can cancel the referral that I had placed to urology. Do NOT cancel referral to GI. But, Please do cancel referral to Urology.

## 2013-12-30 ENCOUNTER — Telehealth: Payer: Self-pay | Admitting: Physician Assistant

## 2013-12-30 NOTE — Telephone Encounter (Signed)
Patient's granddaughter Ponciano Ort calling to see if we can prescribe any different pain medication for [facility], she says it is not working  [phone]

## 2013-12-30 NOTE — Telephone Encounter (Signed)
Pt called back and aware.

## 2014-01-02 NOTE — Telephone Encounter (Signed)
Called and spoke to daughter.  Pt is taking pain med 2 tablets every 4 hours.  On pain scale she says pain is 8.  After taking pain med pain level is a 8.  Has maybe a 30 min pain relief time zone.  Worse pain is flank area radiates towards middle of back.  No problem with bowels or bladder.  Pain can take her breath away when she moves.  Please advise?

## 2014-01-03 MED ORDER — OXYCODONE HCL 10 MG PO TABS: 10.0000 mg | ORAL_TABLET | Freq: Four times a day (QID) | ORAL | Status: DC | PRN

## 2014-01-03 NOTE — Telephone Encounter (Signed)
Spoke to family.  They understand Lansoprazole has been approved for her severe GERD.   And the provider is changing her pain medication to Oxycodone.  Rx printed and family aware they must pick up.

## 2014-01-03 NOTE — Telephone Encounter (Signed)
She is currently taking 10mg  of Hydrocodone. Can change to Oxycodone 10mg  1 po Q 6 hours prn pain # 60 + 0 Please print Rx and have family pick up this Rx.

## 2014-01-05 ENCOUNTER — Telehealth: Payer: Self-pay | Admitting: Physician Assistant

## 2014-01-05 DIAGNOSIS — N200 Calculus of kidney: Secondary | ICD-10-CM

## 2014-01-05 DIAGNOSIS — R109 Unspecified abdominal pain: Secondary | ICD-10-CM

## 2014-01-05 NOTE — Telephone Encounter (Signed)
Having some nausea with Oxycodone.  Told to try Zofran 20-30 min prior to giving pain pill see if that helps.  Also because she has this unresolved back and flank pain.  Family wants referral to Urologist.  Patient only has one kidney so they want to be sure pain is not coming from that.  OK referral?

## 2014-01-05 NOTE — Telephone Encounter (Signed)
Done

## 2014-01-05 NOTE — Telephone Encounter (Signed)
Patients grandaughter calling to see if we can place an order for referral to [facility] if possible  Please call lilzbeth at [phone] and let her know if we can do this

## 2014-01-05 NOTE — Telephone Encounter (Signed)
Yes. I had planned to do Urology referral at the office visit--Ok to Order this now.  --Kidney stones seen on CT scan in ER.  --Pt with Back Pain

## 2014-01-11 ENCOUNTER — Encounter: Payer: Self-pay | Admitting: Gastroenterology

## 2014-01-11 ENCOUNTER — Ambulatory Visit (INDEPENDENT_AMBULATORY_CARE_PROVIDER_SITE_OTHER): Payer: Medicaid Other | Admitting: Gastroenterology

## 2014-01-11 VITALS — BP 116/60 | HR 84 | Ht <= 58 in | Wt 154.0 lb

## 2014-01-11 DIAGNOSIS — R1013 Epigastric pain: Secondary | ICD-10-CM | POA: Diagnosis not present

## 2014-01-11 DIAGNOSIS — R0789 Other chest pain: Secondary | ICD-10-CM | POA: Diagnosis not present

## 2014-01-11 NOTE — Progress Notes (Addendum)
     01/11/2014 Joyce Gilmore [id number] 1934-03-14   History of Present Illness:  This is a 79 year old female who is previously known to Dr. Hilarie Fredrickson. She had a colonoscopy performed by him in April 2014 at which time she was found have diverticulosis and one polyp that was removed and was a tubular adenoma. Due to her age no surveillance colonoscopy was recommended.  She presents to our office today for evaluation of complaints of upper abdominal pain and nausea. She is here today with her daughter. The patient and her daughter are both primarily Spanish-speaking, therefore a translator was present and provided translation throughout the visit. The patient reports that she has been feeling bad in her stomach constantly for the past 2 months. She says that the discomfort is described as burning that goes up into her chest and around both of her sides and into her back. Symptoms are worsened with eating. She describes the pain as a 10 out of 10 on the pain scale at times. She says that when she eats she feels very full. She has frequent nausea, sometimes twice daily, but rarely has any vomiting. She is on Prevacid 30 mg twice daily and Carafate tablet 4 times daily, which she has apparently been taking for a couple of months. She has Zofran for nausea.  Abdominal ultrasound on 12/14/2013 showed no acute findings. She had a CT scan of the abdomen and pelvis without contrast on 12/22/2013. This showed only some chronic right renal scarring/atrophy with multiple non-obstructing right renal calculi and chronic right hydroureteronephrosis. She is apparently going to be seeing urology in the near future as well.  CBC, lipase, and CMP were really unremarkable for any acute issues except she did have hyponatremia with a sodium of 124 when labs were performed 2 weeks ago (it appears that her sodium has been low at least for the past month and has been checked frequently by another provider).  Current Medications,  Allergies, Past Medical History, Past Surgical History, Family History and Social History were reviewed in [employer] record.   Physical Exam: BP 116/60 mmHg  Pulse 84  Ht 4' 8.69" (1.44 m)  Wt 154 lb (69.854 kg)  BMI 33.69 kg/m2 General: Well developed female in no acute distress, but appears somewhat uncomfortable. Head: Normocephalic and atraumatic Eyes:  Sclerae anicteric, conjunctiva pink  Ears: Normal auditory acuity Lungs: Clear throughout to auscultation Heart: Regular rate and rhythm Abdomen: Soft, non-distended.  Normal bowel sounds.  Moderate epigastric TTP without R/R/G. Musculoskeletal: Symmetrical with no gross deformities  Extremities: No edema  Neurological: Alert oriented x 4, grossly non-focal Psychological:  Alert and cooperative. Normal mood and affect  Assessment and Recommendations: -Epigastric abdominal pain/atypical chest pain/early satiety:  No improvement with PPI BID and carafate ACHS.  Other evaluation unremarkable at this point.  Will schedule for EGD for evaluation.  The risks, benefits, and alternatives were discussed with the patient and she consents to proceed.  Will continue PPI and carafate in the interim.  ? Need for contrast CT scan and other evaluation if symptoms continue pending results of EGD.  Addendum: Reviewed and agree with initial management. Jerene Bears, MD

## 2014-01-11 NOTE — Patient Instructions (Signed)
You have been scheduled for an endoscopy. Please follow written instructions given to you at your visit today. If you use inhalers (even only as needed), please bring them with you on the day of your procedure. ____________________________________________________________________________________________  Opciones de alimentos para pacientes con reflujo gastroesofgico (Food Choices for Gastroesophageal Reflux Disease) Cuando se tiene reflujo gastroesofgico (ERGE), los alimentos que se ingieren y los hbitos de alimentacin son Theatre stage manager. Elegir los alimentos adecuados puede ayudar a Public house manager las molestias ocasionadas por el [place]. [place]?  Elija las frutas, los vegetales, los cereales integrales, los productos lcteos, la carne de [place], de pescado y de ave con bajo contenido de grasas.  Limite las grasas, como los [place], los aderezos para [place], la [place], los frutos secos y Publishing copy.  Lleve un registro de las comidas para identificar los alimentos que ocasionan sntomas.  Evite los alimentos que le ocasionen reflujo. Pueden ser distintos para cada persona.  Haga comidas pequeas con frecuencia en lugar de tres comidas Kellogg.  Coma lentamente, en un clima distendido.  Limite el consumo de alimentos fritos.  Cocine los alimentos utilizando mtodos que no sean la fritura.  Evite el consumo alcohol.  Evite beber grandes cantidades de lquidos con las comidas.  Evite agacharse o recostarse hasta despus de 2 o 3horas de haber comido. QU ALIMENTOS NO SE RECOMIENDAN? Los siguientes son algunos alimentos y bebidas que pueden empeorar los sntomas: Astronomer. Jugo de tomate. Salsa de tomate y espagueti. Ajes. Cebolla y Max. Rbano picante. Frutas Naranjas, pomelos y limn (fruta y [place]). Carnes Carnes de [place], de pescado y de ave con gran contenido de grasas. Esto incluye los perros calientes, las [place], el  [place], la salchicha, el salame y el tocino. Lcteos Leche entera y [place]. [employer]. Crema. [place]. Helados. Queso crema.  Bebidas Caf y t negro, con o sin cafena Bebidas gaseosas o energizantes. Condimentos Salsa picante. Salsa barbacoa.  Dulces/postres Chocolate y cacao. Rosquillas. Menta y mentol. Grasas y Unisys Corporation con alto contenido de grasas, incluidas las papas fritas. Otros Vinagre. Especias picantes, como la Solectron Corporation, la pimienta blanca, la pimienta roja, la pimienta de cayena, el curry en [place], los clavos de [place], el jengibre y el [place] en polvo. Los artculos mencionados arriba pueden no ser Dean Foods Company de las bebidas y los alimentos que se Higher education careers adviser. Comunquese con el nutricionista para recibir ms informacin. Document Released: 11/20/2004 Document Revised: 02/15/2013 [facility] Patient Information 2015 [place]. This information is not intended to replace advice given to you by your health care provider. Make sure you discuss any questions you have with your health care provider.

## 2014-01-13 ENCOUNTER — Encounter (HOSPITAL_COMMUNITY): Payer: Self-pay | Admitting: Emergency Medicine

## 2014-01-13 ENCOUNTER — Telehealth: Payer: Self-pay | Admitting: Family Medicine

## 2014-01-13 ENCOUNTER — Emergency Department (HOSPITAL_COMMUNITY)
Admission: EM | Admit: 2014-01-13 | Discharge: 2014-01-13 | Disposition: A | Discharge status: Home / Self Care | Payer: Medicaid Other | Source: Home / Self Care | Attending: Family Medicine | Admitting: Family Medicine

## 2014-01-13 ENCOUNTER — Encounter: Payer: Self-pay | Admitting: Internal Medicine

## 2014-01-13 ENCOUNTER — Telehealth: Payer: Self-pay | Admitting: Physician Assistant

## 2014-01-13 ENCOUNTER — Ambulatory Visit (AMBULATORY_SURGERY_CENTER): Payer: Medicaid Other | Admitting: Internal Medicine

## 2014-01-13 VITALS — BP 134/75 | HR 66 | Temp 98.1°F | Resp 35 | Ht <= 58 in | Wt 154.0 lb

## 2014-01-13 DIAGNOSIS — K259 Gastric ulcer, unspecified as acute or chronic, without hemorrhage or perforation: Secondary | ICD-10-CM

## 2014-01-13 DIAGNOSIS — Z760 Encounter for issue of repeat prescription: Secondary | ICD-10-CM

## 2014-01-13 DIAGNOSIS — R6881 Early satiety: Secondary | ICD-10-CM

## 2014-01-13 DIAGNOSIS — K2950 Unspecified chronic gastritis without bleeding: Secondary | ICD-10-CM

## 2014-01-13 DIAGNOSIS — R1013 Epigastric pain: Secondary | ICD-10-CM

## 2014-01-13 DIAGNOSIS — G8929 Other chronic pain: Secondary | ICD-10-CM

## 2014-01-13 DIAGNOSIS — R110 Nausea: Secondary | ICD-10-CM

## 2014-01-13 LAB — GLUCOSE, CAPILLARY
Glucose-Capillary: 126 mg/dL — ABNORMAL HIGH (ref 70–99)
Glucose-Capillary: 125 mg/dL — ABNORMAL HIGH (ref 70–99)

## 2014-01-13 MED ORDER — ONDANSETRON HCL 4 MG PO TABS: 4.0000 mg | ORAL_TABLET | Freq: Four times a day (QID) | ORAL | Status: DC | PRN

## 2014-01-13 MED ORDER — SODIUM CHLORIDE 0.9 % IV SOLN: 500.0000 mL | INTRAVENOUS | Status: DC

## 2014-01-13 NOTE — Progress Notes (Signed)
Procedure ends, to recovery, report given and VSS. 

## 2014-01-13 NOTE — ED Notes (Signed)
Pt requesting refills on her hydrocodone and zofran Pt voices no other concerns Alert, no signs of acute distress Shirlean Kelly, CMA

## 2014-01-13 NOTE — Patient Instructions (Signed)
Hernia hiatal (Hiatal Hernia) Se produce una hernia hiatal cuando parte del estmago se desliza por arriba del msculo que separa el abdomen del trax (diafragma). Puede nacer con una hernia hiatal (congnita), o esta puede formarse con Mirant. En casi todos los casos de hernia hiatal, solo la parte superior del estmago se protruye.  Muchas personas tienen hernia hiatal asintomtica. Cuanto ms grande es la hernia, ms probabilidades hay de que haya sntomas. En algunos casos, una hernia hiatal permite que el cido estomacal retorne al tubo que lleva la comida desde la boca al estmago (esfago). Esto puede provocar sntomas de Geographical information systems officer. Los sntomas fuertes de [place] tal vez signifiquen que presenta una afeccin llamada enfermedad por reflujo gastroesofgico (ERGE).  Spencer hernias hiatales se deben a una debilidad en el orificio (hiato) donde el esfago pasa a travs del diafragma y se une a la parte superior del [place]. Se puede nacer con una debilidad en el hiato, o bien puede desarrollarse una debilidad. [place] es un factor de riesgo importante de una hernia hiatal. Cualquier cosa que aumente la presin en el diafragma tambin puede incrementar el riesgo de tener una hernia hiatal. Esto incluye:  Embarazo.  Sobrepeso.  Estreimiento frecuente. SIGNOS Y SNTOMAS  Generalmente, las personas que tienen una hernia hiatal no presentan sntomas. Si estos aparecen, casi siempre se deben a ERGE. Estos pueden incluir:  Acidez.  Eructos.  Indigestin.  Dificultad para tragar.  Tos o sibilancias.  Dolor de Investment banker, operational.  Ronquera.  Dolor en el pecho. DIAGNSTICO  A veces, una hernia hiatal se detecta durante un examen que se hace por otro problema. El mdico puede sospechar la presencia de una hernia hiatal si usted tiene sntomas de [place]. Se pueden hacer estudios para diagnosticar ERGE. Estos pueden incluir:  Radiografas de estmago o de trax.  Trnsito  esofagogastroduodenal, un estudio radiogrfico del tubo digestivo que incluye el uso de un lquido terroso que se ingiere. El lquido se revela claramente en la radiografa.  Endoscopia. Este es un procedimiento para Copy interior del estmago mediante el uso de un tubo delgado y flexible que tiene una pequea cmara y Hali Marry en el extremo. TRATAMIENTO  Si no tiene sntomas, tal vez no necesite tratamiento. Si tiene sntomas, el tratamiento puede incluir:  Cambios en la dieta y el estilo de vida para ayudar a disminuir los sntomas de [place].  Los medicamentos. Estos pueden incluir:  Anticidos de USG Corporation.  Medicamentos que ayudan a Systems analyst con mayor rapidez.  Medicamentos que inhiben la produccin de cido estomacal (antagonistas de los receptoresH2).  Medicamentos ms potentes para reducir la cantidad de cido estomacal (inhibidores de la bomba de protones).  Tal vez deba someterse a ciruga para reparar la hernia, si otros tratamientos no son eficaces. [place] todos los Tenneco Inc se lo haya indicado el mdico.  Si fuma, deje de [place].  Intente llegar a un peso corporal saludable y [place].  Ingiera comidas pequeas con frecuencia en lugar de tres comidas abundantes por da. Esto evita que el estmago se llene demasiado.  Coma lentamente.  No se acueste enseguida despus de comer.  No coma durante 1 o 2horas antes de [place].  No tome bebidas que contengan cafena, entre ellas, bebidas cola, caf, cacao y t.  No beba alcohol.  Evite los alimentos que pueden intensificar los sntomas de [place]. Estos pueden incluir:  Comidas grasosas.  Lambert Mody  ctricas.  Otros alimentos y bebidas que contengan cido.  Evite ejercer presin sobre el abdomen. Cualquier cosa que ejerza presin sobre el abdomen aumenta la cantidad de cido que puede retornar al esfago.  No se agache, especialmente despus de  comer.  Eleve la cabecera de la cama con bloques debajo de las patas. Esto mantiene la cabeza y el esfago ms elevados que el [place].  No use prendas apretadas alrededor del pecho o del estmago.  Intente no hacer fuerza al defecar, al orinar o al levantar objetos pesados. SOLICITE ATENCIN MDICA SI:  No es posible controlar los sntomas con medicamentos o cambios en el estilo de vida.  Tiene dificultad para tragar.  Tiene tos o sibilancias que no desaparecen. SOLICITE ATENCIN MDICA DE INMEDIATO SI:  El dolor empeora.  El dolor se irradia a los [place], el cuello, la [place], los dientes o la espalda.  Le falta el aire.  Suda sin motivo.  Tiene malestar estomacal (nuseas) o vmitos.  Vomita sangre.  Observa sangre brillante en la materia fecal.  La materia fecal es negra, de aspecto alquitranado. Document Released: 05/29/2008 Document Revised: 06/27/2013 [facility] Patient Information 2015 [place], [place]. This information is not intended to replace advice given to you by your health care provider. Make sure you discuss any questions you have with your health care provider.

## 2014-01-13 NOTE — Progress Notes (Signed)
Called to room to assist during endoscopic procedure.  Patient ID and intended procedure confirmed with present staff. Received instructions for my participation in the procedure from the performing physician.  

## 2014-01-13 NOTE — Telephone Encounter (Signed)
Family just called now requesting refill of patient pain medication "Oxycodone"  I told her it was impossible for [place] to refill that at his time.  Per state law patient must hand carry written RX to pharmacy and there was no way to do that at [facility] on Friday.  I ask if she was completely out.  Daughter stated NO.  Told them to make what she has last for over the week end.

## 2014-01-13 NOTE — Progress Notes (Signed)
All discharge instructions and information given in Spanish.  Pt. And care partner voiced understanding,via interpreter/

## 2014-01-13 NOTE — Op Note (Signed)
[facility]  [employer]. [place], [address]   ENDOSCOPY PROCEDURE REPORT  PATIENT: Joyce, Gilmore  MR#: #[id number] BIRTHDATE: [birth year] , 15  yrs. old GENDER: female ENDOSCOPIST: Jerene Bears, MD REFERRED BY:  Karis Juba, PA-C PROCEDURE DATE:  01/13/2014 PROCEDURE:  EGD w/ biopsy ASA CLASS:     Class III INDICATIONS:  epigastric pain.  early satiety MEDICATIONS: Monitored anesthesia care and Propofol 150 mg IV TOPICAL ANESTHETIC: none  DESCRIPTION OF PROCEDURE: After the risks benefits and alternatives of the procedure were thoroughly explained, informed consent was obtained.  The LB [id number] endoscope was introduced through the mouth and advanced to the second portion of the duodenum , Without limitations.  The instrument was slowly withdrawn as the mucosa was fully examined.   ESOPHAGUS: The mucosa of the esophagus appeared normal.  Probable, partial,  nonobstructing Schatzki's ring  STOMACH: A 3 cm hiatal hernia was noted.   A single non-bleeding and shallow ulcer measuring 4 x 21mm in size was found at the incisura. This appears to be healing.   Biopsies were taken around the ulcer and at edge of the ulcer.   The stomach otherwise appeared normal; multiple biopsies obtained from the antrum and body of the stomach to rule out H. pylori.  DUODENUM: The duodenal mucosa showed no abnormalities in the bulb and 2nd part of the duodenum.  Retroflexed views revealed a hiatal hernia.     The scope was then withdrawn from the patient and the procedure completed.  COMPLICATIONS: There were no immediate complications.  ENDOSCOPIC IMPRESSION: 1.   The mucosa of the esophagus appeared normal 2.   3 cm hiatal hernia 3.   Single ulcer measuring 4 x 54mm in size was found at the incisura; biopsies were taken 4.   The stomach otherwise appeared normal; multiple biopsies from the antrum and body of the stomach to rule out H. pylori 5.   The duodenal  mucosa showed no abnormalities in the bulb and 2nd part of the duodenum  RECOMMENDATIONS: 1.  Await biopsy results 2.  Follow-up of helicobacter pylori status, treat if indicated 3.  Avoid NSAIDs 4.  Continue PPI and Carafate for now.  Office followup in 4 weeks with Alonza Bogus, PA-C  [eSigned:  Jerene Bears, MD 01/13/2014 9:11 AM ZD:GLOVF, [name] MD and The Patient

## 2014-01-13 NOTE — Discharge Instructions (Signed)
Nuseas, Adulto (Nausea, Adult)  La nusea es la sensacin de Tree surgeon en el estmago o de la necesidad de vomitar. No constituye una preocupacin seria en s misma, pero puede ser un signo de problemas mdicos ms graves. Si empeora, puede provocar vmitos. Si aparecen vmitos, hay riesgo de deshidratacin.  CAUSES   Infecciones por virus.  Intoxicacin alimentaria.  Medicamentos.  Embarazo.  Mareos por movimiento.  Cefaleas migraosas.  Estrs emocional.  Dolor intenso producido en Clinical cytogeneticist.  Intoxicacin por alcohol. INSTRUCCIONES PARA EL CUIDADO EN EL HOGAR   Debe hacer reposo.  Pida instrucciones especficas a su mdico con respecto a la rehidratacin.  Consuma cantidades pequeas de alimentos y tome sorbos de lquidos con ms frecuencia.  [employer] como le indic el mdico. SOLICITE ATENCIN MDICA SI:   No mejora, o [place], despus de 2 das de tratamiento.  Tiene cefalea. SOLICITE ATENCIN MDICA DE INMEDIATO SI:  Tiene fiebre.  Se desmaya.  Sigue vomitando u observa sangre en el vmito.  Se siente extremadamente dbil o deshidratado.  La materia fecal es negra o tiene [place].  Siente dolor intenso en el pecho o en el abdomen. ASEGRESE DE QUE:   Comprende estas instrucciones.  Controlar su enfermedad.  Solicitar ayuda de inmediato si no mejora o si empeora. Document Released: 02/10/2005 Document Revised: 11/05/2011 [facility] Patient Information 2015 [place]. This information is not intended to replace advice given to you by your health care provider. Make sure you discuss any questions you have with your health care provider.

## 2014-01-13 NOTE — ED Provider Notes (Signed)
CSN: [id number]     Arrival date & time 01/13/14  1835 History   First MD Initiated Contact with Patient 01/13/14 1921     Chief Complaint  Patient presents with  . Medication Refill   (Consider location/radiation/quality/duration/timing/severity/associated sxs/prior Treatment) HPI           79 year old female presents requesting a refill of her hydrocodone and her Zofran. She has been getting these medications from her primary care doctor but she has run out. She was prescribed 10 mg oxycodone 10 days ago but she says they make her sick. She says she went to the pharmacy and they told her to come here for her refill. She takes the narcotics for chronic abdominal pain. No recent changes of her pain.  Past Medical History  Diagnosis Date  . Hypertension   . Depression     hx of   . Diabetes mellitus without complication   . Chronic kidney disease     hx of kidney stones,   . Arthritis   . Hearing loss of both ears   . Tinnitus of both ears   . MRSA infection     Oct 13 - Nov 13  . Ganglion, left ankle and foot   . Osteoporosis    Past Surgical History  Procedure Laterality Date  . Total hip revision  12/05/2011    Procedure: TOTAL HIP REVISION;  Surgeon: Mcarthur Rossetti, MD;  Location: [facility];  Service: Orthopedics;  Laterality: Right;  Right Hip Revision Arthroplasty to Total Hip, Excision of Old Implant  . Incision and drainage hip  12/22/2011    Procedure: IRRIGATION AND DEBRIDEMENT HIP;  Surgeon: Mcarthur Rossetti, MD;  Location: [facility];  Service: Orthopedics;  Laterality: Right;  Irrigation and debridement right hip  . Incision and drainage hip  12/27/2011    Procedure: IRRIGATION AND DEBRIDEMENT HIP;  Surgeon: Mcarthur Rossetti, MD;  Location: [facility];  Service: Orthopedics;  Laterality: Right;  Repeat irrigation and debridement  . Joint replacement      bilateral knee, right hip   . Back surgery      history restored after error with record merge        Family History  Problem Relation Age of Onset  . Colon cancer Neg Hx   . Hypertension Mother    History  Substance Use Topics  . Smoking status: Never Smoker   . Smokeless tobacco: Never Used  . Alcohol Use: No   OB History    No data available     Review of Systems  Gastrointestinal: Positive for nausea and abdominal pain.  All other systems reviewed and are negative.   Allergies  Bactrim; Rifampin; and Vancomycin  Home Medications   Prior to Admission medications   Medication Sig Start Date End Date Taking? Authorizing Provider  ACCU-CHEK AVIVA PLUS test strip  11/22/13  Yes Historical Provider, MD  B-D ULTRAFINE III SHORT PEN 31G X 8 MM MISC  10/24/13  Yes Historical Provider, MD  calcium-vitamin D (OSCAL WITH D) 500-200 MG-UNIT per tablet Take 1 tablet by mouth daily.   Yes Historical Provider, MD  insulin aspart (NOVOLOG) 100 UNIT/ML injection Inject 6 Units into the skin 3 (three) times daily before meals. 12/28/13  Yes Mary B Dixon, PA-C  insulin glargine (LANTUS) 100 UNIT/ML injection Inject 0.36 mLs (36 Units total) into the skin at bedtime. 12/28/13  Yes Orlena Sheldon, PA-C  lansoprazole (PREVACID) 30 MG capsule Take 1 capsule (30 mg  total) by mouth 2 (two) times daily before a meal. 12/28/13  Yes Orlena Sheldon, PA-C  lisinopril (PRINIVIL,ZESTRIL) 10 MG tablet Take 1 tablet (10 mg total) by mouth daily. 09/08/13  Yes Mary B Dixon, PA-C  ondansetron (ZOFRAN) 4 MG tablet Take 1 tablet (4 mg total) by mouth every 6 (six) hours as needed for nausea or vomiting. 12/28/13  Yes Orlena Sheldon, PA-C  Oxycodone HCl 10 MG TABS Take 1 tablet (10 mg total) by mouth every 6 (six) hours as needed. 01/03/14  Yes Mary B Dixon, PA-C  sucralfate (CARAFATE) 1 G tablet Take 1 tablet (1 g total) by mouth 4 (four) times daily. 12/25/13  Yes Tanna Furry, MD  Cholecalciferol (VITAMIN D3) 400 UNITS tablet Take 400 Units by mouth daily.    Historical Provider, MD  ondansetron (ZOFRAN) 4 MG tablet Take 1  tablet (4 mg total) by mouth every 6 (six) hours as needed for nausea or vomiting. 01/13/14   Freeman Caldron Maurita Havener, PA-C   BP 151/75 mmHg  Pulse 66  Temp(Src) 97.6 F (36.4 C) (Oral)  Resp 20  SpO2 96% Physical Exam  Constitutional: She is oriented to person, place, and time. Vital signs are normal. She appears well-developed and well-nourished. No distress.  HENT:  Head: Normocephalic and atraumatic.  Pulmonary/Chest: Effort normal. No respiratory distress.  Neurological: She is alert and oriented to person, place, and time. She has normal strength. Coordination normal.  Skin: Skin is warm and dry. No rash noted. She is not diaphoretic.  Psychiatric: She has a normal mood and affect. Judgment normal.  Nursing note and vitals reviewed.   ED Course  Procedures (including critical care time) Labs Review Labs Reviewed - No data to display  Imaging Review No results found.   MDM   1. Chronic pain   2. Nausea   3. Medication refill    I have informed the patient that we do not provide refills for narcotic pain medicines. I will refill her Zofran. She will go back to her primary care doctor refill of her narcotics. I advised her that she may try taking half of an oxycodone because this is doubled strength of her hydrocodone, she will try this.   Meds ordered this encounter  Medications  . ondansetron (ZOFRAN) 4 MG tablet    Sig: Take 1 tablet (4 mg total) by mouth every 6 (six) hours as needed for nausea or vomiting.    Dispense:  20 tablet    Refill:  0    Order Specific Question:  Supervising Provider    Answer:  Ihor Gully D [place], PA-C 01/13/14 1950

## 2014-01-16 ENCOUNTER — Telehealth: Payer: Self-pay | Admitting: *Deleted

## 2014-01-16 MED ORDER — OXYCODONE HCL 10 MG PO TABS: 10.0000 mg | ORAL_TABLET | Freq: Four times a day (QID) | ORAL | Status: DC | PRN

## 2014-01-16 NOTE — Telephone Encounter (Signed)
  Follow up Call-  Call back number 01/13/2014 06/15/2012  Post procedure Call Back phone  # [phone] nieces #  Permission to leave phone message Yes Yes     Patient questions:  Do you have a fever, pain , or abdominal swelling? No. Pain Score  0 *  Have you tolerated food without any problems? Yes.    Have you been able to return to your normal activities? No.  Do you have any questions about your discharge instructions: Diet   No. Medications  No. Follow up visit  No.  Do you have questions or concerns about your Care? No.  Actions: * If pain score is 4 or above: No action needed, pain <4.  Spoke with son, Joyce Gilmore.  Reminded him to have Joyce Gilmore schedule appointment with Alonza Bogus PA-C in 4 weeks.  Also reminded him she should avoid NSAIDS.  He verbalized understanding. Encouraged call back if any questions or problems.

## 2014-01-16 NOTE — Telephone Encounter (Signed)
Can Print Rx for # 60 +0.

## 2014-01-16 NOTE — Telephone Encounter (Signed)
ED did not fill her pain meds.  Rx printed and pt told ready for pick up today.

## 2014-01-23 ENCOUNTER — Ambulatory Visit: Payer: Medicaid Other | Admitting: Physician Assistant

## 2014-01-24 ENCOUNTER — Encounter: Payer: Self-pay | Admitting: Internal Medicine

## 2014-01-25 ENCOUNTER — Encounter: Payer: Self-pay | Admitting: Physician Assistant

## 2014-01-25 ENCOUNTER — Ambulatory Visit (INDEPENDENT_AMBULATORY_CARE_PROVIDER_SITE_OTHER): Payer: Medicaid Other | Admitting: Physician Assistant

## 2014-01-25 ENCOUNTER — Other Ambulatory Visit: Payer: Self-pay | Admitting: Family Medicine

## 2014-01-25 VITALS — BP 136/66 | HR 80 | Temp 98.2°F | Resp 18 | Wt 156.0 lb

## 2014-01-25 DIAGNOSIS — IMO0002 Reserved for concepts with insufficient information to code with codable children: Secondary | ICD-10-CM

## 2014-01-25 DIAGNOSIS — T148 Other injury of unspecified body region: Secondary | ICD-10-CM

## 2014-01-25 DIAGNOSIS — M546 Pain in thoracic spine: Secondary | ICD-10-CM

## 2014-01-25 MED ORDER — INSULIN GLARGINE 100 UNIT/ML ~~LOC~~ SOLN: 36.0000 [IU] | Freq: Every day | SUBCUTANEOUS | Status: DC

## 2014-01-25 MED ORDER — SUCRALFATE 1 G PO TABS: 1.0000 g | ORAL_TABLET | Freq: Four times a day (QID) | ORAL | Status: DC

## 2014-01-25 MED ORDER — INSULIN ASPART 100 UNIT/ML ~~LOC~~ SOLN: 6.0000 [IU] | Freq: Three times a day (TID) | SUBCUTANEOUS | Status: DC

## 2014-01-25 NOTE — Progress Notes (Signed)
Patient ID: RAMEY KETCHERSIDE MRN: [id number], DOB: [birth year], 79 y.o. Date of Encounter: 01/25/2014, 12:11 PM    Chief Complaint:  Chief Complaint  Patient presents with  . f/u back pain    review results, taking Oxy q4-6 hrs depending on pain     HPI: 79 y.o. year old Hispanic female is here today with her daughter, her son, and Spanish interpreter with [employer].  They report that she is leaving this Friday to go to [place]. Son and daughter report that she usually goes to [place] one to 2 times per year and stays anywhere from one month to 3 months at the time.  They report that she has not gone to [place] in over a year now and they feel that a lot of her issues are coming from the fact that she really wants to go to [place].  They say that she Does not sleep well at night  because she is wishing she was in [place]. Therefore they have gone ahead and bought her plane ticket to go to [place] this Friday. They state that they always buy her just a one-way ticket and then let her decide when she wants to return Then buy her another one-way ticket to return.  They are wanting me to go ahead and schedule a referral for her to have a follow-up with a specialist regarding her compression fracture but to make a note for not to be scheduled for at least 4 weeks from now so that it will be after she returns from [place]. They state that they had called needing an increased dose of pain medicine. They state that since we increased the dose and increased the medicine that they actually have been able to start just giving her half of a dose of the time now.     Home Meds:   Outpatient Prescriptions Prior to Visit  Medication Sig Dispense Refill  . ACCU-CHEK AVIVA PLUS test strip   5  . B-D ULTRAFINE III SHORT PEN 31G X 8 MM MISC   11  . calcium-vitamin D (OSCAL WITH D) 500-200 MG-UNIT per tablet Take 1 tablet by mouth daily.    . Cholecalciferol (VITAMIN D3) 400 UNITS tablet Take 400 Units by mouth  daily.    Marland Kitchen lisinopril (PRINIVIL,ZESTRIL) 10 MG tablet Take 1 tablet (10 mg total) by mouth daily. 90 tablet 3  . ondansetron (ZOFRAN) 4 MG tablet Take 1 tablet (4 mg total) by mouth every 6 (six) hours as needed for nausea or vomiting. 30 tablet 0  . Oxycodone HCl 10 MG TABS Take 1 tablet (10 mg total) by mouth every 6 (six) hours as needed. 60 tablet 0  . insulin aspart (NOVOLOG) 100 UNIT/ML injection Inject 6 Units into the skin 3 (three) times daily before meals. 10 mL 5  . insulin glargine (LANTUS) 100 UNIT/ML injection Inject 0.36 mLs (36 Units total) into the skin at bedtime. 10 mL 5  . lansoprazole (PREVACID) 30 MG capsule Take 1 capsule (30 mg total) by mouth 2 (two) times daily before a meal. 60 capsule 5  . ondansetron (ZOFRAN) 4 MG tablet Take 1 tablet (4 mg total) by mouth every 6 (six) hours as needed for nausea or vomiting. 20 tablet 0  . sucralfate (CARAFATE) 1 G tablet Take 1 tablet (1 g total) by mouth 4 (four) times daily. (Patient not taking: Reported on 01/25/2014) 60 tablet 0   No facility-administered medications prior to visit.    Allergies:  Allergies  Allergen Reactions  . Bactrim [Sulfamethoxazole-Trimethoprim]     Hyperkalemia  . Rifampin Other (See Comments)    Severe thrombocytopenia  . Vancomycin Other (See Comments)    Severe thrombocytopenia      Review of Systems: See HPI for pertinent ROS. All other ROS negative.    Physical Exam: Blood pressure 136/66, pulse 80, temperature 98.2 F (36.8 C), temperature source Oral, resp. rate 18, weight 156 lb (70.761 kg)., Body mass index is 34.99 kg/(m^2). General:  Abdominal obesity. Appears in no acute distress. Neck: Supple. No thyromegaly. No lymphadenopathy. Lungs: Clear bilaterally to auscultation without wheezes, rales, or rhonchi. Breathing is unlabored. Heart: Regular rhythm. No murmurs, rubs, or gallops. Msk:  Strength and tone normal for age. Back pain bilaterally at T8 level. Extremities/Skin: Warm  and dry. Neuro: Alert and oriented X 3. Moves all extremities spontaneously. Gait is normal. CNII-XII grossly in tact. Psych:  Responds to questions appropriately with a normal affect.     ASSESSMENT AND PLAN:  79 y.o. year old female with  1. Compression fracture - Ambulatory referral to Neurosurgery  2. Bilateral thoracic back pain - Ambulatory referral to Neurosurgery  A placed order for referral--but made note to make the appointment for at least 4 weeks from now as patient will be in [place].  As far as follow-up visit here, she can follow-up here for her routine diabetic follow-up 3 months from her last diabetic check.  [address] [place], [place], [facility] 01/25/2014 12:11 PM

## 2014-01-25 NOTE — Telephone Encounter (Signed)
Provider refilled at [facility] today

## 2014-01-26 ENCOUNTER — Other Ambulatory Visit: Payer: Self-pay | Admitting: Family Medicine

## 2014-01-26 MED ORDER — INSULIN GLARGINE 100 UNITS/ML SOLOSTAR PEN: 36.0000 [IU] | PEN_INJECTOR | Freq: Every day | SUBCUTANEOUS | Status: DC

## 2014-01-26 MED ORDER — INSULIN ASPART 100 UNIT/ML FLEXPEN: 6.0000 [IU] | PEN_INJECTOR | Freq: Three times a day (TID) | SUBCUTANEOUS | Status: DC

## 2014-01-26 NOTE — Telephone Encounter (Signed)
Insulin refills changed from vials to pens.

## 2014-02-07 ENCOUNTER — Ambulatory Visit: Payer: Medicaid Other | Admitting: Gastroenterology

## 2014-02-08 ENCOUNTER — Encounter: Payer: Self-pay | Admitting: Physician Assistant

## 2014-02-28 ENCOUNTER — Other Ambulatory Visit: Payer: Self-pay | Admitting: Physician Assistant

## 2014-02-28 NOTE — Telephone Encounter (Signed)
Medication refilled per protocol. 

## 2014-03-02 ENCOUNTER — Ambulatory Visit: Payer: Medicaid Other | Admitting: Internal Medicine

## 2014-03-09 ENCOUNTER — Ambulatory Visit: Payer: Medicaid Other | Admitting: Physician Assistant

## 2014-04-18 ENCOUNTER — Encounter: Payer: Self-pay | Admitting: Physician Assistant

## 2014-04-25 ENCOUNTER — Telehealth: Payer: Self-pay | Admitting: Family Medicine

## 2014-04-25 DIAGNOSIS — E1165 Type 2 diabetes mellitus with hyperglycemia: Secondary | ICD-10-CM

## 2014-04-25 DIAGNOSIS — IMO0002 Reserved for concepts with insufficient information to code with codable children: Secondary | ICD-10-CM

## 2014-04-25 MED ORDER — BLOOD GLUCOSE MONITOR KIT: PACK | Status: DC

## 2014-04-25 NOTE — Telephone Encounter (Signed)
Pt glucometer is broken.  Order for new one faxed to her pharmacy

## 2014-04-28 ENCOUNTER — Ambulatory Visit (INDEPENDENT_AMBULATORY_CARE_PROVIDER_SITE_OTHER): Payer: Medicaid Other | Admitting: Family Medicine

## 2014-04-28 ENCOUNTER — Encounter: Payer: Self-pay | Admitting: Family Medicine

## 2014-04-28 VITALS — BP 144/76 | HR 80 | Temp 98.9°F | Resp 22 | Ht <= 58 in | Wt 157.0 lb

## 2014-04-28 DIAGNOSIS — E785 Hyperlipidemia, unspecified: Secondary | ICD-10-CM | POA: Diagnosis not present

## 2014-04-28 DIAGNOSIS — E1165 Type 2 diabetes mellitus with hyperglycemia: Secondary | ICD-10-CM

## 2014-04-28 DIAGNOSIS — H833X3 Noise effects on inner ear, bilateral: Secondary | ICD-10-CM

## 2014-04-28 DIAGNOSIS — E871 Hypo-osmolality and hyponatremia: Secondary | ICD-10-CM

## 2014-04-28 DIAGNOSIS — Z794 Long term (current) use of insulin: Secondary | ICD-10-CM

## 2014-04-28 DIAGNOSIS — E119 Type 2 diabetes mellitus without complications: Secondary | ICD-10-CM

## 2014-04-28 DIAGNOSIS — I10 Essential (primary) hypertension: Secondary | ICD-10-CM

## 2014-04-28 DIAGNOSIS — IMO0002 Reserved for concepts with insufficient information to code with codable children: Secondary | ICD-10-CM

## 2014-04-28 MED ORDER — SUCRALFATE 1 G PO TABS: 1.0000 g | ORAL_TABLET | Freq: Four times a day (QID) | ORAL | Status: DC

## 2014-04-28 MED ORDER — LISINOPRIL 10 MG PO TABS: 10.0000 mg | ORAL_TABLET | Freq: Every day | ORAL | Status: DC

## 2014-04-28 MED ORDER — INSULIN GLARGINE 100 UNIT/ML SOLOSTAR PEN: 40.0000 [IU] | PEN_INJECTOR | Freq: Every day | SUBCUTANEOUS | Status: DC

## 2014-04-28 MED ORDER — LANSOPRAZOLE 30 MG PO CPDR: 30.0000 mg | DELAYED_RELEASE_CAPSULE | Freq: Two times a day (BID) | ORAL | Status: DC

## 2014-04-28 MED ORDER — INSULIN ASPART 100 UNIT/ML FLEXPEN: PEN_INJECTOR | SUBCUTANEOUS | Status: DC

## 2014-04-28 MED ORDER — INSULIN GLARGINE 100 UNITS/ML SOLOSTAR PEN: 40.0000 [IU] | PEN_INJECTOR | Freq: Every day | SUBCUTANEOUS | Status: DC

## 2014-04-28 MED ORDER — OXYCODONE HCL 10 MG PO TABS: 10.0000 mg | ORAL_TABLET | Freq: Four times a day (QID) | ORAL | Status: DC | PRN

## 2014-04-28 MED ORDER — PRAVASTATIN SODIUM 20 MG PO TABS: 20.0000 mg | ORAL_TABLET | Freq: Every day | ORAL | Status: DC

## 2014-04-28 NOTE — Patient Instructions (Addendum)
Stop the medications from [place] Lantus  40 units at bedtime Give 6 units with Lunch only  Pain medication refilled Continue stomach medications Check your blood sugars twice a day ( morning and bedtime) Bring your meter to visit Referral ENT F/U 4 weeks-

## 2014-04-29 LAB — CBC WITH DIFFERENTIAL/PLATELET
Basophils Absolute: 0 10*3/uL (ref 0.0–0.1)
Basophils Relative: 0 % (ref 0–1)
EOS PCT: 1 % (ref 0–5)
Eosinophils Absolute: 0.1 10*3/uL (ref 0.0–0.7)
HCT: 38 % (ref 36.0–46.0)
HEMOGLOBIN: 12.2 g/dL (ref 12.0–15.0)
LYMPHS ABS: 1.9 10*3/uL (ref 0.7–4.0)
LYMPHS PCT: 26 % (ref 12–46)
MCH: 26.3 pg (ref 26.0–34.0)
MCHC: 32.1 g/dL (ref 30.0–36.0)
MCV: 82.1 fL (ref 78.0–100.0)
MONOS PCT: 9 % (ref 3–12)
MPV: 10.8 fL (ref 8.6–12.4)
Monocytes Absolute: 0.7 10*3/uL (ref 0.1–1.0)
NEUTROS ABS: 4.7 10*3/uL (ref 1.7–7.7)
NEUTROS PCT: 64 % (ref 43–77)
Platelets: 203 10*3/uL (ref 150–400)
RBC: 4.63 MIL/uL (ref 3.87–5.11)
RDW: 17.1 % — ABNORMAL HIGH (ref 11.5–15.5)
WBC: 7.4 10*3/uL (ref 4.0–10.5)

## 2014-04-29 LAB — COMPREHENSIVE METABOLIC PANEL
ALT: 17 U/L (ref 0–35)
AST: 25 U/L (ref 0–37)
Albumin: 4 g/dL (ref 3.5–5.2)
Alkaline Phosphatase: 83 U/L (ref 39–117)
BUN: 21 mg/dL (ref 6–23)
CO2: 20 mEq/L (ref 19–32)
Calcium: 9.3 mg/dL (ref 8.4–10.5)
Chloride: 103 mEq/L (ref 96–112)
Creat: 1.07 mg/dL (ref 0.50–1.10)
Glucose, Bld: 112 mg/dL — ABNORMAL HIGH (ref 70–99)
Potassium: 5 mEq/L (ref 3.5–5.3)
Sodium: 136 mEq/L (ref 135–145)
Total Bilirubin: 0.3 mg/dL (ref 0.2–1.2)
Total Protein: 6.6 g/dL (ref 6.0–8.3)

## 2014-04-29 LAB — LIPID PANEL
CHOLESTEROL: 117 mg/dL (ref 0–200)
HDL: 45 mg/dL — AB (ref >=46)
LDL Cholesterol: 36 mg/dL (ref 0–99)
TRIGLYCERIDES: 180 mg/dL — AB (ref <150)
Total CHOL/HDL Ratio: 2.6 Ratio
VLDL: 36 mg/dL (ref 0–40)

## 2014-04-29 LAB — HEMOGLOBIN A1C
HEMOGLOBIN A1C: 6.3 % — AB (ref <5.7)
Mean Plasma Glucose: 134 mg/dL — ABNORMAL HIGH (ref <117)

## 2014-04-30 NOTE — Assessment & Plan Note (Signed)
I spent appro 40 minutes with this pt and the interpreter, it was dificult to tell what she was taking and when, if she was having any SE or benefits from the meds. Unfortuantely I have no way of know what the medications were from [place].  All of her labs will be checked today  After discussion of her meals, seems her biggest meal of day is lunch which is a little later in the day She will take Lantus 40 units and Novolog units with Lunch only  Continue cholesterol medication and lisinopril refilled for BP Will see if my office manager can figure out why she has only Medicaid, she would benefit from some Medicare services

## 2014-04-30 NOTE — Progress Notes (Signed)
Patient ID: Joyce Gilmore, female   DOB: [birth year], 80 y.o.   MRN: [id number]   Subjective:    Patient ID: Joyce Gilmore, female    DOB: 1934-11-16, 80 y.o.   MRN: [id number]  Patient presents for 3 month F/U; Medication Review/ Refill; and Hearing Disturbances  Very complex medical history. Pt here with son and interpreter she speaks spainish, she recently traveled to [place], she came back with 3 prescription boxes of medication 1, was an injection she states was for pain, the other 2 neither the patient or the interpreter can tell me what they are for or the [place].  DM- history of uncontrolled DM, last A1C was 7.5% she is suppose to be on Lantus 36 units and Novolog 6 units with each meals. She does take the lantus, but not always the Novolog she checked her BS seems fasting and with breakfast?? No hypoglycemia that I can discern but she only wants to take 2 shots a day, therefore I am concerned she has not been doing the other shots.  History of Osteoporosis and OA, had recent compression fracture, has old bottle for Oxycodone, states it helps, but she did take at least 1 injection from the prescriber in [place] ( Deca Darabolin)  Hyperlipidemia- should be on pravastatin  Jerrye Bushy- chronic GI problems, has script for prevacid and sucralfate she states they help, but had a lot of sucralfate left and no prevacid  Hearing- has noticed hearing changes, has a lot of background noises in her "head" wants to see specialist for this.    Review Of Systems:  GEN- denies fatigue, fever, weight loss,weakness, recent illness HEENT- denies eye drainage, change in vision, nasal discharge, CVS- denies chest pain, palpitations RESP- denies SOB, cough, wheeze ABD- denies N/V, change in stools, abd pain GU- denies dysuria, hematuria, dribbling, incontinence MSK- + joint pain, muscle aches, injury Neuro- denies headache, dizziness, syncope, seizure activity       Objective:    BP 144/76  mmHg  Pulse 80  Temp(Src) 98.9 F (37.2 C) (Oral)  Resp 22  Ht 4\' 8"  (1.422 m)  Wt 157 lb (71.215 kg)  BMI 35.22 kg/m2 GEN- NAD, alert and oriented x3,elderly female HEENT- PERRL, EOMI, non injected sclera, pink conjunctiva, MMM, oropharynx clear Neck- Supple, no thyromegaly CVS- RRR, no murmur RESP-CTAB MSK- mild TTP thoraic and lumbar region, neg SLR, cautious gait EXT- No edema Pulses- Radial 2+        Assessment & Plan:      Problem List Items Addressed This Visit      Unprioritized   Hyponatremia   Essential hypertension   Relevant Medications   pravastatin (PRAVACHOL) tablet   lisinopril (PRINIVIL,ZESTRIL) tablet   Other Relevant Orders   CBC with Differential/Platelet (Completed)   Comprehensive metabolic panel (Completed)   Lipid panel (Completed)   Diabetes mellitus type II, uncontrolled   Relevant Medications   insulin aspart (NOVOLOG) 100 UNIT/ML FlexPen   pravastatin (PRAVACHOL) tablet   lisinopril (PRINIVIL,ZESTRIL) tablet   LANTUS SOLOSTAR 100 UNIT/ML [place] SOLN    Other Visit Diagnoses    Diabetes mellitus type 2, insulin dependent    -  Primary    Relevant Medications    insulin aspart (NOVOLOG) 100 UNIT/ML FlexPen    pravastatin (PRAVACHOL) tablet    lisinopril (PRINIVIL,ZESTRIL) tablet    LANTUS SOLOSTAR 100 UNIT/ML [place] SOLN    Other Relevant Orders    CBC with Differential/Platelet (Completed)    Comprehensive metabolic panel (Completed)  Hemoglobin A1c (Completed)    Lipid panel (Completed)    Hearing disorder, noise-induced, bilateral           Note: This dictation was prepared with Dragon dictation along with smaller phrase technology. Any transcriptional errors that result from this process are unintentional.

## 2014-04-30 NOTE — Assessment & Plan Note (Signed)
Non fasting labs so expect TG to be a little elevated Restart pravastatin

## 2014-04-30 NOTE — Assessment & Plan Note (Signed)
Blood pressure a little elevated but will not change per above, no sure what kind of meds she has in her system Based on age still appropriate, continue lisinopril D/C all meds from [place]

## 2014-05-12 ENCOUNTER — Other Ambulatory Visit: Payer: Self-pay | Admitting: Physician Assistant

## 2014-05-12 DIAGNOSIS — Z1231 Encounter for screening mammogram for malignant neoplasm of breast: Secondary | ICD-10-CM

## 2014-05-23 ENCOUNTER — Telehealth: Payer: Self-pay | Admitting: Family Medicine

## 2014-05-23 ENCOUNTER — Ambulatory Visit (HOSPITAL_COMMUNITY)
Admission: RE | Admit: 2014-05-23 | Discharge: 2014-05-23 | Disposition: A | Discharge status: Home / Self Care | Payer: Medicaid Other | Source: Ambulatory Visit | Attending: Physician Assistant | Admitting: Physician Assistant

## 2014-05-23 DIAGNOSIS — Z1231 Encounter for screening mammogram for malignant neoplasm of breast: Secondary | ICD-10-CM | POA: Diagnosis not present

## 2014-05-23 NOTE — Telephone Encounter (Signed)
-----   Message from Alycia Rossetti, MD sent at 04/30/2014  6:23 PM EST ----- Regarding: Medicaid only?   Any idea why this 80 year old would not qualify for Medicare. She is hispanic has been here for 16 years.   She needs services but only Medicaid

## 2014-05-23 NOTE — Telephone Encounter (Signed)
I have left msg for patient to return my call. I was going to give her Medicare and [employer]'s phone numbers. Medicare is [phone] and [employer] is [phone] she can contact either of these and see if she can enroll in their insurance.

## 2014-05-26 ENCOUNTER — Encounter: Payer: Self-pay | Admitting: Family Medicine

## 2014-05-26 ENCOUNTER — Ambulatory Visit (INDEPENDENT_AMBULATORY_CARE_PROVIDER_SITE_OTHER): Payer: Medicaid Other | Admitting: Family Medicine

## 2014-05-26 VITALS — BP 132/70 | HR 68 | Temp 98.8°F | Resp 16 | Ht <= 58 in | Wt 151.0 lb

## 2014-05-26 DIAGNOSIS — E119 Type 2 diabetes mellitus without complications: Secondary | ICD-10-CM

## 2014-05-26 DIAGNOSIS — N390 Urinary tract infection, site not specified: Secondary | ICD-10-CM | POA: Diagnosis not present

## 2014-05-26 DIAGNOSIS — R358 Other polyuria: Secondary | ICD-10-CM

## 2014-05-26 DIAGNOSIS — R3589 Other polyuria: Secondary | ICD-10-CM

## 2014-05-26 DIAGNOSIS — M19032 Primary osteoarthritis, left wrist: Secondary | ICD-10-CM

## 2014-05-26 DIAGNOSIS — E785 Hyperlipidemia, unspecified: Secondary | ICD-10-CM | POA: Diagnosis not present

## 2014-05-26 LAB — URINALYSIS, ROUTINE W REFLEX MICROSCOPIC
BILIRUBIN URINE: NEGATIVE
Glucose, UA: NEGATIVE mg/dL
Ketones, ur: NEGATIVE mg/dL
Nitrite: NEGATIVE
Protein, ur: NEGATIVE mg/dL
SPECIFIC GRAVITY, URINE: 1.01 (ref 1.005–1.030)
Urobilinogen, UA: 0.2 mg/dL (ref 0.0–1.0)
pH: 5.5 (ref 5.0–8.0)

## 2014-05-26 LAB — URINALYSIS, MICROSCOPIC ONLY
CRYSTALS: NONE SEEN
Casts: NONE SEEN

## 2014-05-26 MED ORDER — CIPROFLOXACIN HCL 500 MG PO TABS: 500.0000 mg | ORAL_TABLET | Freq: Two times a day (BID) | ORAL | Status: DC

## 2014-05-26 MED ORDER — NAPROXEN 375 MG PO TABS: 375.0000 mg | ORAL_TABLET | Freq: Two times a day (BID) | ORAL | Status: DC

## 2014-05-26 NOTE — Patient Instructions (Signed)
Take antibiotics for bladder infection Use naprosyn for the arthritis in your hand and back/knees COntinue all other medications F/U 4 months- when you return from [place]

## 2014-05-26 NOTE — Progress Notes (Signed)
Patient ID: Joyce Gilmore, female   DOB: [birth year], 80 y.o.   MRN: [id number]   Subjective:    Patient ID: Joyce Gilmore, female    DOB: Jun 03, 1934, 80 y.o.   MRN: [id number]  Patient presents for F/U; Frequent Urination; and L Wrist Pain   Pt here for intermin follow-up on chronic medical problems, interpreter used. No longer taking the medications she received from [place]. 1. DM- taking Lantus 35 units and Novolog 6 units with lunch, A1C 6.3% which is improvement, no hypoglycemia symptoms. SHe is  Also on 10mg  of pravastatin    2. Complains of dysuria for past 2 weeks, urgency and frequency, no blood in urine  3. Left wrist pain x 4 days,- had swelling a pain over entire wrist and thumb, took an advil which helped, now very little pain and swelling has gone down  She also saw ENT, is getting fit for hearing aides Review Of Systems:  GEN- denies fatigue, fever, weight loss,weakness, recent illness HEENT- denies eye drainage, change in vision, nasal discharge, CVS- denies chest pain, palpitations RESP- denies SOB, cough, wheeze ABD- denies N/V, change in stools, abd pain GU-+ dysuria, hematuria, dribbling, incontinence MSK- + joint pain, muscle aches, injury Neuro- denies headache, dizziness, syncope, seizure activity       Objective:    BP 132/70 mmHg  Pulse 68  Temp(Src) 98.8 F (37.1 C) (Oral)  Resp 16  Ht 4\' 8"  (1.422 m)  Wt 151 lb (68.493 kg)  BMI 33.87 kg/m2 GEN- NAD, alert and oriented x3 HEENT- PERRL, EOMI, non injected sclera, pink conjunctiva, MMM, oropharynx clear CVS- RRR, no murmur RESP-CTAB ABD-NABS,soft,NT,ND, no CVA tenderness MSK- Left wrist, miinimal swelling at base of thumb, +finklesteins, fair ROM wrist, able to make loose fist bilat, decreased grip strength EXT- No edema Pulses- Radial, DP- 2+        Assessment & Plan:      Problem List Items Addressed This Visit      Unprioritized   UTI (lower urinary tract infection)    Treat with  Cipro BID , increase water intake      HLD (hyperlipidemia)    Continue pravastatin 10mg  once a day       Diabetes mellitus type II, controlled    A1C shows good control, continue with lantus 35 units and Novolog 6 units with lunch as largest meal  Of day       Other Visit Diagnoses    Polyuria    -  Primary    Relevant Orders    Urinalysis, Routine w reflex microscopic (Completed)    Urine culture    Primary osteoarthritis of left wrist        Most likley OA, also has some mild tendinitis, already improving, so will give low dose naprosyn BID prn, hold on imaging unless it gets worse    Relevant Medications    naproxen (NAPROSYN) tablet       Note pt is [place], Glass blower/designer gave her son information on how to get her set up for Medicare.    She will also be leaving to spend 2 months in [place] and will need medications  Note: This dictation was prepared with Dragon dictation along with smaller phrase technology. Any transcriptional errors that result from this process are unintentional.

## 2014-05-27 NOTE — Assessment & Plan Note (Signed)
A1C shows good control, continue with lantus 35 units and Novolog 6 units with lunch as largest meal  Of day

## 2014-05-27 NOTE — Assessment & Plan Note (Signed)
Treat with Cipro BID , increase water intake

## 2014-05-27 NOTE — Assessment & Plan Note (Signed)
Continue pravastatin 10mg  once a day

## 2014-05-29 ENCOUNTER — Other Ambulatory Visit: Payer: Self-pay | Admitting: *Deleted

## 2014-05-29 LAB — URINE CULTURE: Colony Count: >=100000

## 2014-05-29 MED ORDER — NITROFURANTOIN MONOHYD MACRO 100 MG PO CAPS: 100.0000 mg | ORAL_CAPSULE | Freq: Two times a day (BID) | ORAL | Status: DC

## 2014-06-01 ENCOUNTER — Telehealth: Payer: Self-pay | Admitting: Physician Assistant

## 2014-06-01 MED ORDER — OXYCODONE HCL 10 MG PO TABS: 10.0000 mg | ORAL_TABLET | Freq: Four times a day (QID) | ORAL | Status: DC | PRN

## 2014-06-01 NOTE — Telephone Encounter (Signed)
LRF 04/28/14 #60  LOV 05/26/14  OK refill?

## 2014-06-01 NOTE — Telephone Encounter (Signed)
Approved for # 60 + 0 

## 2014-06-01 NOTE — Telephone Encounter (Signed)
Patient needs to pick up rx for oxycodone  [phone]

## 2014-06-01 NOTE — Telephone Encounter (Signed)
rx printed, tried to call patient and was hung up on.

## 2014-06-02 ENCOUNTER — Encounter: Payer: Self-pay | Admitting: Family Medicine

## 2014-06-02 ENCOUNTER — Ambulatory Visit (INDEPENDENT_AMBULATORY_CARE_PROVIDER_SITE_OTHER): Payer: Medicaid Other | Admitting: Family Medicine

## 2014-06-02 VITALS — BP 150/78 | HR 80 | Temp 98.5°F | Resp 20 | Wt 167.0 lb

## 2014-06-02 DIAGNOSIS — R197 Diarrhea, unspecified: Secondary | ICD-10-CM

## 2014-06-02 DIAGNOSIS — R112 Nausea with vomiting, unspecified: Secondary | ICD-10-CM

## 2014-06-02 LAB — CBC W/MCH & 3 PART DIFF
HCT: 36.8 % (ref 36.0–46.0)
HEMOGLOBIN: 12.5 g/dL (ref 12.0–15.0)
LYMPHS ABS: 1.2 10*3/uL (ref 0.7–4.0)
Lymphocytes Relative: 14 % (ref 12–46)
MCH: 27.4 pg (ref 26.0–34.0)
MCHC: 34 g/dL (ref 30.0–36.0)
MCV: 80.5 fL (ref 78.0–100.0)
Neutro Abs: 7.2 10*3/uL (ref 1.7–7.7)
Neutrophils Relative %: 82 % — ABNORMAL HIGH (ref 43–77)
PLATELETS: 162 10*3/uL (ref 150–400)
RBC: 4.57 MIL/uL (ref 3.87–5.11)
RDW: 15.5 % (ref 11.5–15.5)
WBC mixed population %: 4 % (ref 3–18)
WBC mixed population: 0.4 10*3/uL (ref 0.1–1.8)
WBC: 8.8 10*3/uL (ref 4.0–10.5)

## 2014-06-02 MED ORDER — METOCLOPRAMIDE HCL 10 MG PO TABS
10.0000 mg | ORAL_TABLET | Freq: Three times a day (TID) | ORAL | Status: DC
Start: 2014-06-02 — End: 2014-09-24

## 2014-06-02 MED ORDER — ONDANSETRON HCL 4 MG PO TABS: 4.0000 mg | ORAL_TABLET | Freq: Three times a day (TID) | ORAL | Status: DC | PRN

## 2014-06-02 NOTE — Telephone Encounter (Signed)
Left message on mobile phone number that RX is ready for pick up

## 2014-06-02 NOTE — Progress Notes (Signed)
Subjective:    Patient ID: Joyce Gilmore, female    DOB: 1934/12/22, 80 y.o.   MRN: [id number]  HPI  Patient presents with a multitude of complaints. However her most pressing concern is starting yesterday she developed severe nausea. She's had 2 or 3 episodes of vomiting. She was straining very hard with vomiting and she did notice trace bright red blood in the vomitus from where she had been straining. She is still nauseated today. She denies any abdominal pain. She does have a lot more gas and is burping more frequently. However she continues to have flatus. Furthermore she is complaining of diarrhea. She is having 456 or 7 bowel movements a day which are watery. Patient states that this is been going on for over 2 months ever since she returned from [place]. She denies any fever. She denies any headaches. She denies any visual changes. She denies any dysuria. She does have a history of uncontrolled type 2 diabetes mellitus. She denies any history of gastroparesis. She denies any sick contacts. Today on examination her CBC shows a normal white blood cell count and a normal hemoglobin. Otherwise her exam is completely normal Past Medical History  Diagnosis Date  . Hypertension   . Depression     hx of   . Diabetes mellitus without complication   . Chronic kidney disease     hx of kidney stones,   . Arthritis   . Hearing loss of both ears   . Tinnitus of both ears   . MRSA infection     Oct 13 - Nov 13  . Ganglion, left ankle and foot   . Osteoporosis    Past Surgical History  Procedure Laterality Date  . Total hip revision  12/05/2011    Procedure: TOTAL HIP REVISION;  Surgeon: Mcarthur Rossetti, MD;  Location: [facility];  Service: Orthopedics;  Laterality: Right;  Right Hip Revision Arthroplasty to Total Hip, Excision of Old Implant  . Incision and drainage hip  12/22/2011    Procedure: IRRIGATION AND DEBRIDEMENT HIP;  Surgeon: Mcarthur Rossetti, MD;  Location: [facility];   Service: Orthopedics;  Laterality: Right;  Irrigation and debridement right hip  . Incision and drainage hip  12/27/2011    Procedure: IRRIGATION AND DEBRIDEMENT HIP;  Surgeon: Mcarthur Rossetti, MD;  Location: [facility];  Service: Orthopedics;  Laterality: Right;  Repeat irrigation and debridement  . Joint replacement      bilateral knee, right hip   . Back surgery      history restored after error with record merge       Current Outpatient Prescriptions on File Prior to Visit  Medication Sig Dispense Refill  . ACCU-CHEK AVIVA PLUS test strip   5  . B-D ULTRAFINE III SHORT PEN 31G X 8 MM MISC   11  . blood glucose meter kit and supplies KIT Dispense based on patient and insurance preference. Use up to four times daily as directed. (FOR ICD-9 250.00, 250.01). 1 each 0  . calcium-vitamin D (OSCAL WITH D) 500-200 MG-UNIT per tablet Take 1 tablet by mouth daily.    . Cholecalciferol (VITAMIN D3) 400 UNITS tablet Take 400 Units by mouth daily.    . insulin aspart (NOVOLOG) 100 UNIT/ML FlexPen Give 6 units with Lunch 15 mL 11  . Insulin Glargine (LANTUS SOLOSTAR) 100 UNIT/ML Solostar Pen Inject 40 Units into the skin at bedtime. 5 pen 11  . lansoprazole (PREVACID) 30 MG capsule Take 1  capsule (30 mg total) by mouth 2 (two) times daily before a meal. 60 capsule 5  . nitrofurantoin, macrocrystal-monohydrate, (MACROBID) 100 MG capsule Take 1 capsule (100 mg total) by mouth 2 (two) times daily. 10 capsule 0  . Oxycodone HCl 10 MG TABS Take 1 tablet (10 mg total) by mouth every 6 (six) hours as needed. 60 tablet 0  . pravastatin (PRAVACHOL) 20 MG tablet Take 1 tablet (20 mg total) by mouth daily. Etoricoxib 90 mg PO QHS 90 tablet 1  . sucralfate (CARAFATE) 1 G tablet Take 1 tablet (1 g total) by mouth 4 (four) times daily. 120 tablet 2  . lisinopril (PRINIVIL,ZESTRIL) 10 MG tablet Take 1 tablet (10 mg total) by mouth daily. (Patient not taking: Reported on 06/02/2014) 90 tablet 3  . naproxen (NAPROSYN)  375 MG tablet Take 1 tablet (375 mg total) by mouth 2 (two) times daily with a meal. For arthritis (Patient not taking: Reported on 06/02/2014) 60 tablet 3  . ondansetron (ZOFRAN) 4 MG tablet Take 1 tablet (4 mg total) by mouth every 6 (six) hours as needed for nausea or vomiting. (Patient not taking: Reported on 04/28/2014) 30 tablet 0   No current facility-administered medications on file prior to visit.   Allergies  Allergen Reactions  . Bactrim [Sulfamethoxazole-Trimethoprim]     Hyperkalemia  . Rifampin Other (See Comments)    Severe thrombocytopenia  . Vancomycin Other (See Comments)    Severe thrombocytopenia   History   Social History  . Marital Status: Widowed    Spouse Name: N/A  . Number of Children: 7  . Years of Education: N/A   Occupational History  . Not on file.   Social History Main Topics  . Smoking status: Never Smoker   . Smokeless tobacco: Never Used  . Alcohol Use: No  . Drug Use: No  . Sexual Activity: No   Other Topics Concern  . Not on file   Social History Narrative   ** Merged History Encounter **         Review of Systems  All other systems reviewed and are negative.      Objective:   Physical Exam  Constitutional: She appears well-developed and well-nourished.  Cardiovascular: Normal rate, regular rhythm and normal heart sounds.   No murmur heard. Pulmonary/Chest: Effort normal and breath sounds normal. No respiratory distress. She has no wheezes. She has no rales.  Abdominal: Soft. Bowel sounds are normal. She exhibits no distension and no mass. There is no tenderness. There is no rebound and no guarding.  Vitals reviewed.         Assessment & Plan:  Nausea and vomiting, vomiting of unspecified type - Plan: CBC w/MCH & 3 Part Diff, metoCLOPramide (REGLAN) 10 MG tablet, ondansetron (ZOFRAN) 4 MG tablet  Diarrhea - Plan: Stool culture, Ova and parasite examination  Patient's exam today is completely normal. Differential  diagnosis includes gastroparesis, and gastroenteritis, ileus, pancreatitis. There is no evidence of an acute abdomen. She has no symptoms of a bowel obstruction in fact she's having diarrhea which is somewhat chronic. Therefore, treat the patient presumably as gastroparesis with Zofran 4 mg every 8 hours as needed for nausea and Reglan 10 mg 3 times a day as needed for nausea. I will recheck the patient next week. If the vomiting worsens or she develops frank hematemesis she is instructed to go immediately to the emergency room. Recheck next week. I will workup her diarrhea more thoroughly with stool cultures and  stool O&P.

## 2014-06-03 ENCOUNTER — Encounter (HOSPITAL_COMMUNITY): Payer: Self-pay

## 2014-06-03 ENCOUNTER — Emergency Department (HOSPITAL_COMMUNITY)
Admission: EM | Admit: 2014-06-03 | Discharge: 2014-06-03 | Disposition: A | Discharge status: Home / Self Care | Payer: Medicaid Other | Attending: Emergency Medicine | Admitting: Emergency Medicine

## 2014-06-03 ENCOUNTER — Emergency Department (HOSPITAL_COMMUNITY): Payer: Medicaid Other

## 2014-06-03 DIAGNOSIS — J019 Acute sinusitis, unspecified: Secondary | ICD-10-CM

## 2014-06-03 DIAGNOSIS — F329 Major depressive disorder, single episode, unspecified: Secondary | ICD-10-CM | POA: Insufficient documentation

## 2014-06-03 DIAGNOSIS — Z79899 Other long term (current) drug therapy: Secondary | ICD-10-CM | POA: Insufficient documentation

## 2014-06-03 DIAGNOSIS — N189 Chronic kidney disease, unspecified: Secondary | ICD-10-CM | POA: Insufficient documentation

## 2014-06-03 DIAGNOSIS — Z8614 Personal history of Methicillin resistant Staphylococcus aureus infection: Secondary | ICD-10-CM | POA: Insufficient documentation

## 2014-06-03 DIAGNOSIS — E119 Type 2 diabetes mellitus without complications: Secondary | ICD-10-CM | POA: Diagnosis not present

## 2014-06-03 DIAGNOSIS — R05 Cough: Secondary | ICD-10-CM | POA: Diagnosis not present

## 2014-06-03 DIAGNOSIS — M1990 Unspecified osteoarthritis, unspecified site: Secondary | ICD-10-CM | POA: Diagnosis not present

## 2014-06-03 DIAGNOSIS — R197 Diarrhea, unspecified: Secondary | ICD-10-CM | POA: Insufficient documentation

## 2014-06-03 DIAGNOSIS — R112 Nausea with vomiting, unspecified: Secondary | ICD-10-CM | POA: Diagnosis not present

## 2014-06-03 DIAGNOSIS — I129 Hypertensive chronic kidney disease with stage 1 through stage 4 chronic kidney disease, or unspecified chronic kidney disease: Secondary | ICD-10-CM | POA: Insufficient documentation

## 2014-06-03 DIAGNOSIS — H9193 Unspecified hearing loss, bilateral: Secondary | ICD-10-CM | POA: Diagnosis not present

## 2014-06-03 DIAGNOSIS — Z7951 Long term (current) use of inhaled steroids: Secondary | ICD-10-CM | POA: Diagnosis not present

## 2014-06-03 DIAGNOSIS — Z794 Long term (current) use of insulin: Secondary | ICD-10-CM | POA: Diagnosis not present

## 2014-06-03 DIAGNOSIS — R059 Cough, unspecified: Secondary | ICD-10-CM

## 2014-06-03 LAB — CBC WITH DIFFERENTIAL/PLATELET
Basophils Absolute: 0 10*3/uL (ref 0.0–0.1)
Basophils Relative: 0 % (ref 0–1)
EOS PCT: 0 % (ref 0–5)
Eosinophils Absolute: 0 10*3/uL (ref 0.0–0.7)
HEMATOCRIT: 34 % — AB (ref 36.0–46.0)
Hemoglobin: 11.5 g/dL — ABNORMAL LOW (ref 12.0–15.0)
LYMPHS ABS: 1.8 10*3/uL (ref 0.7–4.0)
LYMPHS PCT: 19 % (ref 12–46)
MCH: 26.7 pg (ref 26.0–34.0)
MCHC: 33.8 g/dL (ref 30.0–36.0)
MCV: 79.1 fL (ref 78.0–100.0)
Monocytes Absolute: 1 10*3/uL (ref 0.1–1.0)
Monocytes Relative: 11 % (ref 3–12)
NEUTROS ABS: 6.4 10*3/uL (ref 1.7–7.7)
NEUTROS PCT: 70 % (ref 43–77)
PLATELETS: 155 10*3/uL (ref 150–400)
RBC: 4.3 MIL/uL (ref 3.87–5.11)
RDW: 14.4 % (ref 11.5–15.5)
WBC: 9.2 10*3/uL (ref 4.0–10.5)

## 2014-06-03 LAB — COMPREHENSIVE METABOLIC PANEL
ALK PHOS: 75 U/L (ref 39–117)
ALT: 17 U/L (ref 0–35)
ANION GAP: 9 (ref 5–15)
AST: 25 U/L (ref 0–37)
Albumin: 3.4 g/dL — ABNORMAL LOW (ref 3.5–5.2)
BUN: 10 mg/dL (ref 6–23)
CHLORIDE: 94 mmol/L — AB (ref 96–112)
CO2: 23 mmol/L (ref 19–32)
CREATININE: 0.96 mg/dL (ref 0.50–1.10)
Calcium: 8.7 mg/dL (ref 8.4–10.5)
GFR calc non Af Amer: 56 mL/min — ABNORMAL LOW (ref >90)
GFR, EST AFRICAN AMERICAN: 64 mL/min — AB (ref >90)
Glucose, Bld: 119 mg/dL — ABNORMAL HIGH (ref 70–99)
Potassium: 4.3 mmol/L (ref 3.5–5.1)
Sodium: 126 mmol/L — ABNORMAL LOW (ref 135–145)
TOTAL PROTEIN: 6.3 g/dL (ref 6.0–8.3)
Total Bilirubin: 0.6 mg/dL (ref 0.3–1.2)

## 2014-06-03 LAB — LIPASE, BLOOD: LIPASE: 26 U/L (ref 11–59)

## 2014-06-03 MED ORDER — ONDANSETRON 4 MG PO TBDP: 4.0000 mg | ORAL_TABLET | Freq: Three times a day (TID) | ORAL | Status: DC | PRN

## 2014-06-03 MED ORDER — FLUTICASONE PROPIONATE 50 MCG/ACT NA SUSP: NASAL | Status: DC

## 2014-06-03 MED ORDER — SODIUM CHLORIDE 0.9 % IV BOLUS (SEPSIS)
1000.0000 mL | Freq: Once | INTRAVENOUS | Status: AC
  Administered 2014-06-03: 1000 mL via INTRAVENOUS

## 2014-06-03 MED ORDER — AMOXICILLIN 500 MG PO CAPS: 500.0000 mg | ORAL_CAPSULE | Freq: Three times a day (TID) | ORAL | Status: DC

## 2014-06-03 NOTE — ED Notes (Signed)
Pt. s family gave pt. A bowl of vegetables to eat.  Pt. Denies any n/v. No s/s of n/v/d since eating.  Tolerated well

## 2014-06-03 NOTE — Discharge Instructions (Signed)
Nuseas y Vmitos (Nausea and Vomiting) La nusea es la sensacin de Tree surgeon en el estmago o de la necesidad de vomitar. El vmito es un reflejo por el que los contenidos del estmago salen por la boca. El vmito puede ocasionar prdida de lquidos del organismo (deshidratacin). Los nios y los [employer] pueden deshidratarse rpidamente (en especial si tambin tienen diarrea). Las nuseas y los vmitos son sntoma de un trastorno o enfermedad. Es importante Energy manager causa de los sntomas. CAUSAS  Irritacin directa de la membrana que cubre el Carmine. Esta irritacin puede ser resultado del aumento de la produccin de cido, (reflujo gastroesofgico), infecciones, intoxicacin alimentaria, ciertos medicamentos (como antinflamatorios no esteroideos), consumo de alcohol o de tabaco.  Seales del cerebro.Estas seales pueden ser un dolor de cabeza, exposicin al calor, trastornos del odo interno, aumento de la presin en el cerebro por lesiones, infeccin, un tumor o conmocin cerebral, estmulos emocionales o problemas metablicos.  Una obstruccin en el tracto gastrointestinal (obstruccin intestinal).  Ciertas enfermedades como la diabetes, problemas en la vescula biliar, apendicitis, problemas renales, cncer, sepsis, sntomas atpicos de infarto o trastornos alimentarios.  Tratamientos mdicos como la quimioterapia y la radiacin.  Medicamentos que inducen al sueo (anestesia general) durante Clementeen Hoof. DIAGNSTICO  El mdico podr solicitarle algunos anlisis si los problemas no mejoran luego de algunos das. Tambin podrn pedirle anlisis si los sntomas son graves o si el motivo de los vmitos o las nuseas no est claro. Los [employer] ser:   Anlisis de [place].  Anlisis de [place].  Pruebas de materia fecal.  Cultivos (para buscar evidencias de infeccin).  Radiografas u otros estudios por imgenes. Los [employer] de las pruebas lo ayudarn al mdico a  tomar decisiones acerca del mejor curso de tratamiento o la necesidad de [employer].  TRATAMIENTO  Debe estar bien hidratado. Beba con frecuencia pequeas cantidades de lquido.Puede beber agua, bebidas deportivas, caldos claros o comer pequeos trocitos de hielo o gelatina para mantenerse hidratado.Cuando coma, hgalo lentamente para evitar las nuseas.Hay medicamentos para evitar las nuseas que pueden aliviarlo.  INSTRUCCIONES PARA EL CUIDADO DOMICILIARIO  Si su mdico le prescribe medicamentos tmelos como se le haya indicado.  Si no tiene hambre, no se fuerce a comer. Sin embargo, es necesario que tome lquidos.  Si tiene hambre alimntese con una dieta normal, a menos que el mdico le indique otra cosa.  Los mejores alimentos son Ardelia Mems combinacin de carbohidratos complejos (arroz, trigo, papas, pan), carnes magras, yogur, frutas y Photographer.  Evite los alimentos ricos en grasas porque dificultan la digestin.  Beba gran cantidad de lquido para mantener la orina de tono claro o color amarillo plido.  Si est deshidratado, consulte a su mdico para que le d instrucciones especficas para volver a hidratarlo. Los signos de deshidratacin son:  Doristine Section sed.  Labios y boca secos.  Mareos.  Elmon Else.  Disminucin de la frecuencia y cantidad de la [place].  Confusin.  Tiene el pulso o la respiracin acelerados. SOLICITE ATENCIN MDICA DE INMEDIATO SI:  Vomita sangre o algo similar a la borra del caf.  La materia fecal (heces) es negra o tiene [address].  Sufre una cefalea grave o rigidez en el cuello.  Se siente confundido.  Siente dolor abdominal intenso.  Tiene dolor en el pecho o dificultad para respirar.  No orina por 8 horas.  Tiene la piel fra y pegajosa.  Sigue vomitando durante ms de 24 a 48 horas.  Tiene fiebre. ASEGRESE QUE:   Comprende  estas instrucciones.  Controlar su enfermedad.  Solicitar ayuda inmediatamente si no mejora o  si empeora. Document Released: 03/02/2007 Document Revised: 05/05/2011 [facility] Patient Information 2015 [place]. This information is not intended to replace advice given to you by your health care provider. Make sure you discuss any questions you have with your health care provider.  Sinusitis (Sinusitis) La sinusitis es el enrojecimiento, el dolor y la inflamacin de los senos paranasales. Los senos paranasales son bolsas de aire que se encuentran dentro de los huesos de la cara (por debajo de los ojos, en la mitad de la frente o por encima de los ojos). En los senos paranasales sanos, el moco puede drenar y el aire circula a travs de ellos en su camino hacia la Lawyer. Sin embargo, cuando se [place], el moco y el aire [place]. Esto hace que se desarrollen bacterias y otros grmenes que causan infeccin. La sinusitis puede desarrollarse rpidamente y durar solo un tiempo corto (aguda) o continuar por un perodo largo (crnica). La sinusitis que dura ms de 12 semanas se considera crnica.  CAUSAS  Las causas de la sinusitis son:  Cualquier alergia que tenga.  Las [employer], como el desplazamiento del cartlago que separa las fosas nasales (desvo del tabique), que pueden disminuir el flujo de aire por la nariz y los senos paranasales, y Print production planner su drenaje.  Las anomalas funcionales, como cuando los pequeos pelos (cilias) que se encuentran en los senos paranasales y que ayudan a eliminar el moco no funcionan correctamente o no estn presentes. Ceres sntomas de la sinusitis aguda y [place] son los mismos. Los sntomas principales son Conservation officer, historic buildings y la presin alrededor de los senos paranasales afectados. Otros sntomas son:  Chief of Staff.  Dolor de odos.  Dolor de [place].  Mal aliento.  Disminucin del sentido del olfato y del gusto.  Tos, que empeora al [employer].  Fatiga.  Cristy Hilts.  Drenaje de moco espeso por la  nariz, que generalmente es de color verde y puede contener pus (purulento).  Hinchazn y calor en los senos paranasales afectados. DIAGNSTICO  Su mdico le Chartered certified accountant examen fsico. Durante el examen, el mdico:  Le revisar la nariz para buscar signos de crecimientos anormales en las fosas nasales (plipos nasales).  Palpar los senos paranasales afectados para buscar signos de infeccin.  Ver el interior de los senos paranasales (endoscopia) a travs de un dispositivo de obtencin de imgenes que tiene una luz conectada (endoscopio). Si el mdico sospecha que usted sufre sinusitis crnica, podr indicar una o ms de las siguientes pruebas:  Pruebas de Buyer, retail.  Cultivo de las [employer]. Se extrae Truddie Coco de moco de la nariz, que se enva al laboratorio para detectar bacterias.  Citologa nasal. Se extrae Truddie Coco de moco de la nariz, que el mdico examina para determinar si la sinusitis est relacionada con Obie Dredge. TRATAMIENTO  La mayora de los casos de sinusitis aguda se deben a una infeccin viral y se resuelven espontneamente en un perodo de 10das. En algunos casos, se recetan medicamentos para [employer] (analgsicos, descongestivos, aerosoles nasales con corticoides o aerosoles salinos).  Sin embargo, para la sinusitis por infeccin bacteriana, Camera operator. Los antibiticos son medicamentos que destruyen las bacterias que causan la infeccin.  Con poca frecuencia, la sinusitis tiene su origen en una infeccin por hongos. En estos casos, el mdico recetar un medicamento antimictico. Para algunos casos de sinusitis crnica,  es necesario someterse a [place]. Generalmente, se trata de [employer] la sinusitis se repite ms de 3veces al ao, a pesar de otros tratamientos. INSTRUCCIONES PARA EL CUIDADO EN EL HOGAR   Beber gran cantidad de lquidos. Los lquidos ayudan a [employer], para que drene ms  fcilmente de los senos paranasales.  Use un humidificador.  Inhale vapor de 3 a 4 veces al da (por ejemplo, sintese en el bao con la ducha abierta).  Aplquese un pao tibio y hmedo en la cara 3 o 4 veces [place] indicaciones del mdico.  Use un aerosol nasal salino para ayudar a Air cabin crew y [employer] senos nasales.  Tome los medicamentos solamente como se lo haya indicado el mdico.  Si le recetaron un antimictico o un antibitico, asegrese de terminarlos, incluso si comienza a sentirse mejor. SOLICITE ATENCIN MDICA DE INMEDIATO SI:  Siente ms dolor o sufre dolores de cabeza intensos.  Tiene nuseas, vmitos o somnolencia.  Observa hinchazn alrededor del rostro.  Tiene problemas de visin.  Presenta rigidez en el cuello.  Tiene dificultad para respirar. ASEGRESE DE QUE:   Comprende estas instrucciones.  Controlar su afeccin.  Recibir ayuda de inmediato si no mejora o si empeora. Document Released: 11/20/2004 Document Revised: 06/27/2013 [facility] Patient Information 2015 [place]. This information is not intended to replace advice given to you by your health care provider. Make sure you discuss any questions you have with your health care provider.

## 2014-06-03 NOTE — ED Provider Notes (Signed)
CSN: [id number]     Arrival date & time 06/03/14  1107 History   First MD Initiated Contact with Patient 06/03/14 1141     Chief Complaint  Patient presents with  . Cough     HPI  Patient presents for evaluation of multiple complaints. States she's been vomiting and having diarrhea for the last 3 days until this morning. This has all stopped. She states 2 days ago that may have been a little bit of blood in her emesis but not today she's had a cough for the last few days and feels like she cannot breathe through her nose. When she blows her nose there is blood in it. She feels a frontal headache with sinus pressure and feels ringing in both of her ears. No vertigo or spinning. No chest pain. No frank hemoptysis.  Past Medical History  Diagnosis Date  . Hypertension   . Depression     hx of   . Diabetes mellitus without complication   . Chronic kidney disease     hx of kidney stones,   . Arthritis   . Hearing loss of both ears   . Tinnitus of both ears   . MRSA infection     Oct 13 - Nov 13  . Ganglion, left ankle and foot   . Osteoporosis    Past Surgical History  Procedure Laterality Date  . Total hip revision  12/05/2011    Procedure: TOTAL HIP REVISION;  Surgeon: Mcarthur Rossetti, MD;  Location: [facility];  Service: Orthopedics;  Laterality: Right;  Right Hip Revision Arthroplasty to Total Hip, Excision of Old Implant  . Incision and drainage hip  12/22/2011    Procedure: IRRIGATION AND DEBRIDEMENT HIP;  Surgeon: Mcarthur Rossetti, MD;  Location: [facility];  Service: Orthopedics;  Laterality: Right;  Irrigation and debridement right hip  . Incision and drainage hip  12/27/2011    Procedure: IRRIGATION AND DEBRIDEMENT HIP;  Surgeon: Mcarthur Rossetti, MD;  Location: [facility];  Service: Orthopedics;  Laterality: Right;  Repeat irrigation and debridement  . Joint replacement      bilateral knee, right hip   . Back surgery      history restored after error with record merge        Family History  Problem Relation Age of Onset  . Colon cancer Neg Hx   . Hypertension Mother    History  Substance Use Topics  . Smoking status: Never Smoker   . Smokeless tobacco: Never Used  . Alcohol Use: No   OB History    No data available     Review of Systems  Constitutional: Negative for fever, chills, diaphoresis, appetite change and fatigue.  HENT: Positive for congestion, rhinorrhea and sinus pressure. Negative for mouth sores, sore throat and trouble swallowing.   Eyes: Negative for visual disturbance.  Respiratory: Positive for cough. Negative for chest tightness, shortness of breath and wheezing.   Cardiovascular: Negative for chest pain.  Gastrointestinal: Positive for nausea, vomiting and diarrhea. Negative for abdominal pain and abdominal distention.  Endocrine: Negative for polydipsia, polyphagia and polyuria.  Genitourinary: Negative for dysuria, frequency and hematuria.  Musculoskeletal: Negative for gait problem.  Skin: Negative for color change, pallor and rash.  Neurological: Negative for dizziness, syncope, light-headedness and headaches.  Hematological: Does not bruise/bleed easily.  Psychiatric/Behavioral: Negative for behavioral problems and confusion.      Allergies  Bactrim; Rifampin; and Vancomycin  Home Medications   Prior to Admission medications  Medication Sig Start Date End Date Taking? Authorizing Provider  calcium-vitamin D (OSCAL WITH D) 500-200 MG-UNIT per tablet Take 1 tablet by mouth daily.   Yes Historical Provider, MD  Cholecalciferol (VITAMIN D3) 400 UNITS tablet Take 400 Units by mouth daily.   Yes Historical Provider, MD  insulin aspart (NOVOLOG) 100 UNIT/ML FlexPen Give 6 units with Lunch 04/28/14  Yes Alycia Rossetti, MD  Insulin Glargine (LANTUS SOLOSTAR) 100 UNIT/ML Solostar Pen Inject 40 Units into the skin at bedtime. 04/28/14  Yes Alycia Rossetti, MD  lansoprazole (PREVACID) 30 MG capsule Take 1 capsule (30 mg  total) by mouth 2 (two) times daily before a meal. 04/28/14  Yes Alycia Rossetti, MD  lisinopril (PRINIVIL,ZESTRIL) 10 MG tablet Take 1 tablet (10 mg total) by mouth daily. 04/28/14  Yes Alycia Rossetti, MD  metoCLOPramide (REGLAN) 10 MG tablet Take 1 tablet (10 mg total) by mouth 3 (three) times daily before meals. 06/02/14  Yes Susy Frizzle, MD  ondansetron (ZOFRAN) 4 MG tablet Take 1 tablet (4 mg total) by mouth every 8 (eight) hours as needed for nausea or vomiting. 06/02/14  Yes Susy Frizzle, MD  Oxycodone HCl 10 MG TABS Take 1 tablet (10 mg total) by mouth every 6 (six) hours as needed. Patient taking differently: Take 10 mg by mouth every 6 (six) hours as needed (pain).  06/01/14  Yes Mary B Dixon, PA-C  pravastatin (PRAVACHOL) 20 MG tablet Take 1 tablet (20 mg total) by mouth daily. Etoricoxib 90 mg PO QHS 04/28/14  Yes Alycia Rossetti, MD  sucralfate (CARAFATE) 1 G tablet Take 1 tablet (1 g total) by mouth 4 (four) times daily. 04/28/14  Yes Alycia Rossetti, MD  ACCU-CHEK AVIVA PLUS test strip  11/22/13   Historical Provider, MD  amoxicillin (AMOXIL) 500 MG capsule Take 1 capsule (500 mg total) by mouth 3 (three) times daily. 06/03/14   Tanna Furry, MD  B-D ULTRAFINE III SHORT PEN 31G X 8 MM MISC  10/24/13   Historical Provider, MD  blood glucose meter kit and supplies KIT Dispense based on patient and insurance preference. Use up to four times daily as directed. (FOR ICD-9 250.00, 250.01). 04/25/14   Orlena Sheldon, PA-C  fluticasone [facility]) 50 MCG/ACT nasal spray 1 spray each nares bid 06/03/14   Tanna Furry, MD  naproxen (NAPROSYN) 375 MG tablet Take 1 tablet (375 mg total) by mouth 2 (two) times daily with a meal. For arthritis Patient not taking: Reported on 06/02/2014 05/26/14   Alycia Rossetti, MD  nitrofurantoin, macrocrystal-monohydrate, (MACROBID) 100 MG capsule Take 1 capsule (100 mg total) by mouth 2 (two) times daily. Patient not taking: Reported on 06/03/2014 05/29/14   Alycia Rossetti, MD   ondansetron (ZOFRAN ODT) 4 MG disintegrating tablet Take 1 tablet (4 mg total) by mouth every 8 (eight) hours as needed for nausea. 06/03/14   Tanna Furry, MD  ondansetron (ZOFRAN) 4 MG tablet Take 1 tablet (4 mg total) by mouth every 6 (six) hours as needed for nausea or vomiting. Patient not taking: Reported on 06/03/2014 12/28/13   Orlena Sheldon, PA-C   BP 140/59 mmHg  Pulse 62  Temp(Src) 97.9 F (36.6 C) (Oral)  Resp 17  Ht $R'5\' 1"'yF$  (1.549 m)  SpO2 97% Physical Exam  Constitutional: She is oriented to person, place, and time. She appears well-developed and well-nourished. No distress.  HENT:  Head: Normocephalic.  Nasal congestion. Some dried blood in the right  naris.  Eyes: Conjunctivae are normal. Pupils are equal, round, and reactive to light. No scleral icterus.  Neck: Normal range of motion. Neck supple. No thyromegaly present.  Cardiovascular: Normal rate and regular rhythm.  Exam reveals no gallop and no friction rub.   No murmur heard. Pulmonary/Chest: Effort normal and breath sounds normal. No respiratory distress. She has no wheezes. She has no rales.  Clear bilateral breath sounds. No wheezing rales rhonchi or prolongation.  Abdominal: Soft. Bowel sounds are normal. She exhibits no distension. There is no tenderness. There is no rebound.  Soft benign abdomen.  Musculoskeletal: Normal range of motion.  Neurological: She is alert and oriented to person, place, and time.  Skin: Skin is warm and dry. No rash noted.  Psychiatric: She has a normal mood and affect. Her behavior is normal.    ED Course  Procedures (including critical care time) Labs Review Labs Reviewed  CBC WITH DIFFERENTIAL/PLATELET - Abnormal; Notable for the following:    Hemoglobin 11.5 (*)    HCT 34.0 (*)    All other components within normal limits  COMPREHENSIVE METABOLIC PANEL - Abnormal; Notable for the following:    Sodium 126 (*)    Chloride 94 (*)    Glucose, Bld 119 (*)    Albumin 3.4 (*)     GFR calc non Af Amer 56 (*)    GFR calc Af Amer 64 (*)    All other components within normal limits  LIPASE, BLOOD    Imaging Review Dg Chest 2 View  06/03/2014   CLINICAL DATA:  Cough for 3 days.  EXAM: CHEST  2 VIEW  COMPARISON:  12/28/2013 and prior radiographs.  FINDINGS: Cardiomegaly is identified.  Peribronchial thickening is again identified.  Mild elevation of the right hemidiaphragm is again noted.  Further wedge compression of T8 is noted, now 75-80%.  No other acute bony abnormalities are identified.  IMPRESSION: Cardiomegaly and mild chronic peribronchial thickening. No evidence of focal pneumonia.  Further wedge compression fracture of T8 since 12/28/2013, now 75-80%.   Electronically Signed   By: Margarette Canada M.D.   On: 06/03/2014 13:46     EKG Interpretation None      MDM   Final diagnoses:  Cough  Acute sinusitis, recurrence not specified, unspecified location  Non-intractable vomiting with nausea, vomiting of unspecified type      Patient's main complaints are that she's had a cough. Has had ringing in her ear for several weeks. She has congestion in her nose. When she blows her nose in the emergency room there is blood in it. No additional nausea vomiting or diarrhea here she tolerating by mouth. Sodium slightly low. Given a liter of IV fluid. Chest x-ray shows no acute pulmonary abnormalities. Symptoms are most consistent with a sinusitis. Given amoxicillin and Flonase. Zofran. Recheck if any worsening symptoms including additional vomiting. Any blood in emesis or blood in stools nursing shortness of breath or other changes.  Tanna Furry, MD 06/03/14 [phone]

## 2014-06-03 NOTE — ED Notes (Signed)
Pt.s grand daughter is interrupting .

## 2014-06-03 NOTE — ED Notes (Addendum)
Paramedics reports that pt. Has had a cough for 3 days.  Pt. Went to her MD yesterday and they instructed her to come to the ED if the cough became worse.  Paramedics reports that the pt. Coughed up some blood this am in a tissue.  Paramedics did not see any blood.  Pt. Very anxious.   Pt. Denies any chest pain,  Pt. Also reports that she has had diarrhea for 3 days

## 2014-06-05 ENCOUNTER — Ambulatory Visit (INDEPENDENT_AMBULATORY_CARE_PROVIDER_SITE_OTHER): Payer: Medicaid Other | Admitting: Family Medicine

## 2014-06-05 ENCOUNTER — Encounter: Payer: Self-pay | Admitting: Family Medicine

## 2014-06-05 VITALS — BP 140/78 | HR 78 | Temp 98.4°F | Resp 16 | Ht <= 58 in | Wt 158.0 lb

## 2014-06-05 DIAGNOSIS — E871 Hypo-osmolality and hyponatremia: Secondary | ICD-10-CM | POA: Diagnosis not present

## 2014-06-05 DIAGNOSIS — J0100 Acute maxillary sinusitis, unspecified: Secondary | ICD-10-CM | POA: Diagnosis not present

## 2014-06-05 DIAGNOSIS — K529 Noninfective gastroenteritis and colitis, unspecified: Secondary | ICD-10-CM

## 2014-06-05 NOTE — Patient Instructions (Addendum)
Complete antibiotics Take the carafate four times a day as needed Continue all other medications Cancel appt for 4/14  F/U as previous

## 2014-06-05 NOTE — Addendum Note (Signed)
Addended by: Kimorah Ridolfi, [place] on: 06/05/2014 09:20 AM   Modules accepted: Orders

## 2014-06-05 NOTE — Progress Notes (Signed)
Patient ID: Joyce Gilmore, female   DOB: 1934/08/20, 80 y.o.   MRN: [id number]   Subjective:    Patient ID: Joyce Gilmore, female    DOB: Nov 15, 1934, 80 y.o.   MRN: [id number]  Patient presents for Hospital F/U  patient here for hospital follow-up. She was seen last week by my partner at that time she had nausea vomiting and abdominal pain with diarrhea. Stool studies will return today. She has significantly worse nausea vomiting therefore she went to the ER on Saturday the ninth. No CT scan was done however her labs did show hyponatremia and she was dehydrated therefore she was given IV fluids and antiemetics. She was also diagnosed with a sinus infection and she is currently on amoxicillin. Her nausea vomiting has subsided and her diarrhea has now resolved. Her appetite is back. She does not have any abdominal pain. In general she feels well except for some nasal stuffiness. She's here today with the interpreter as well as her daughter who cares for her. Her medications were reviewed.    Review Of Systems:  GEN- denies fatigue, fever, weight loss,weakness, recent illness HEENT- denies eye drainage, change in vision, +nasal discharge, CVS- denies chest pain, palpitations RESP- denies SOB, cough, wheeze ABD- denies N/V, change in stools, abd pain GU- denies dysuria, hematuria, dribbling, incontinence MSK- d+joint pain, muscle aches, injury Neuro- denies headache, dizziness, syncope, seizure activity       Objective:    BP 140/78 mmHg  Pulse 78  Temp(Src) 98.4 F (36.9 C) (Oral)  Resp 16  Ht 4\' 8"  (1.422 m)  Wt 158 lb (71.668 kg)  BMI 35.44 kg/m2 GEN- NAD, alert and oriented x3 HEENT- PERRL, EOMI, non injected sclera, pink conjunctiva, MMM, oropharynx clear, nares clear rhinorrhea, no maxillary sinus tenderness Neck- Supple, no thyromegaly CVS- RRR, no murmur RESP-CTAB ABD-NABS,soft,NT,ND EXT- No edema Pulses- Radial, 2+        Assessment & Plan:      Problem List  Items Addressed This Visit      Unprioritized   Hyponatremia   Relevant Orders   Comprehensive metabolic panel (Completed)   CBC with Differential/Platelet (Completed)    Other Visit Diagnoses    Gastroenteritis    -  Primary    Resolved, advised to keep up with fluids this week    Relevant Orders    CBC with Differential/Platelet (Completed)    Acute maxillary sinusitis, recurrence not specified        Currently on treatment, though did not complain about this at [place] on friday, she can complete amoxicillin and flonase       Note: This dictation was prepared with Dragon dictation along with smaller phrase technology. Any transcriptional errors that result from this process are unintentional.

## 2014-06-06 LAB — COMPREHENSIVE METABOLIC PANEL
ALBUMIN: 3.5 g/dL (ref 3.5–5.2)
ALK PHOS: 85 U/L (ref 39–117)
ALT: 16 U/L (ref 0–35)
AST: 23 U/L (ref 0–37)
BUN: 17 mg/dL (ref 6–23)
CALCIUM: 8.5 mg/dL (ref 8.4–10.5)
CHLORIDE: 93 meq/L — AB (ref 96–112)
CO2: 22 mEq/L (ref 19–32)
Creat: 0.94 mg/dL (ref 0.50–1.10)
GLUCOSE: 92 mg/dL (ref 70–99)
Potassium: 4.6 mEq/L (ref 3.5–5.3)
SODIUM: 124 meq/L — AB (ref 135–145)
Total Bilirubin: 0.3 mg/dL (ref 0.2–1.2)
Total Protein: 6.6 g/dL (ref 6.0–8.3)

## 2014-06-06 LAB — CBC WITH DIFFERENTIAL/PLATELET
BASOS PCT: 0 % (ref 0–1)
Basophils Absolute: 0 10*3/uL (ref 0.0–0.1)
Eosinophils Absolute: 0.1 10*3/uL (ref 0.0–0.7)
Eosinophils Relative: 1 % (ref 0–5)
HCT: 36 % (ref 36.0–46.0)
HEMOGLOBIN: 11.8 g/dL — AB (ref 12.0–15.0)
Lymphocytes Relative: 17 % (ref 12–46)
Lymphs Abs: 1.7 10*3/uL (ref 0.7–4.0)
MCH: 26.4 pg (ref 26.0–34.0)
MCHC: 32.8 g/dL (ref 30.0–36.0)
MCV: 80.5 fL (ref 78.0–100.0)
MONO ABS: 0.9 10*3/uL (ref 0.1–1.0)
MONOS PCT: 9 % (ref 3–12)
MPV: 10.2 fL (ref 8.6–12.4)
NEUTROS ABS: 7.2 10*3/uL (ref 1.7–7.7)
Neutrophils Relative %: 73 % (ref 43–77)
Platelets: 209 10*3/uL (ref 150–400)
RBC: 4.47 MIL/uL (ref 3.87–5.11)
RDW: 15.2 % (ref 11.5–15.5)
WBC: 9.9 10*3/uL (ref 4.0–10.5)

## 2014-06-06 LAB — OVA AND PARASITE EXAMINATION: OP: NONE SEEN

## 2014-06-06 NOTE — Addendum Note (Signed)
Addended by: Vic Blackbird F on: 06/06/2014 10:48 PM   Modules accepted: Orders

## 2014-06-06 NOTE — Assessment & Plan Note (Signed)
Recheck NA, likley low due to GI losses, she is on ACEI which is potassium sparing

## 2014-06-07 ENCOUNTER — Other Ambulatory Visit: Payer: Self-pay | Admitting: *Deleted

## 2014-06-07 DIAGNOSIS — E871 Hypo-osmolality and hyponatremia: Secondary | ICD-10-CM

## 2014-06-08 ENCOUNTER — Ambulatory Visit: Payer: Medicaid Other | Admitting: Family Medicine

## 2014-06-08 LAB — STOOL CULTURE

## 2014-06-09 ENCOUNTER — Other Ambulatory Visit: Payer: Medicaid Other

## 2014-06-09 ENCOUNTER — Other Ambulatory Visit: Payer: Self-pay | Admitting: Physician Assistant

## 2014-06-09 DIAGNOSIS — E871 Hypo-osmolality and hyponatremia: Secondary | ICD-10-CM

## 2014-06-09 LAB — BASIC METABOLIC PANEL WITH GFR
BUN: 11 mg/dL (ref 6–23)
CALCIUM: 9.2 mg/dL (ref 8.4–10.5)
CO2: 22 mEq/L (ref 19–32)
Chloride: 86 mEq/L — ABNORMAL LOW (ref 96–112)
Creat: 0.95 mg/dL (ref 0.50–1.10)
GFR, EST AFRICAN AMERICAN: 67 mL/min
GFR, Est Non African American: 58 mL/min — ABNORMAL LOW
GLUCOSE: 82 mg/dL (ref 70–99)
Potassium: 4.8 mEq/L (ref 3.5–5.3)
SODIUM: 119 meq/L — AB (ref 135–145)

## 2014-06-09 NOTE — Telephone Encounter (Signed)
Refill appropriate and filled per protocol. 

## 2014-06-10 LAB — CHLORIDE, URINE, RANDOM: Chloride Urine: 37 mEq/L

## 2014-06-10 LAB — SODIUM, URINE, RANDOM: Sodium, Ur: 33 mEq/L

## 2014-06-10 LAB — OSMOLALITY: OSMOLALITY: 246 mosm/kg — AB (ref 275–300)

## 2014-06-12 ENCOUNTER — Other Ambulatory Visit: Payer: Medicaid Other

## 2014-06-12 DIAGNOSIS — E871 Hypo-osmolality and hyponatremia: Secondary | ICD-10-CM

## 2014-06-12 LAB — BASIC METABOLIC PANEL WITH GFR
BUN: 13 mg/dL (ref 6–23)
CHLORIDE: 96 meq/L (ref 96–112)
CO2: 24 mEq/L (ref 19–32)
Calcium: 9.1 mg/dL (ref 8.4–10.5)
Creat: 1.02 mg/dL (ref 0.50–1.10)
GFR, Est African American: 61 mL/min
GFR, Est Non African American: 53 mL/min — ABNORMAL LOW
GLUCOSE: 80 mg/dL (ref 70–99)
POTASSIUM: 4.6 meq/L (ref 3.5–5.3)
Sodium: 128 mEq/L — ABNORMAL LOW (ref 135–145)

## 2014-06-13 ENCOUNTER — Encounter: Payer: Self-pay | Admitting: Family Medicine

## 2014-06-13 ENCOUNTER — Other Ambulatory Visit: Payer: Self-pay | Admitting: Family Medicine

## 2014-06-13 ENCOUNTER — Ambulatory Visit: Payer: Medicaid Other | Admitting: Family Medicine

## 2014-06-13 DIAGNOSIS — E222 Syndrome of inappropriate secretion of antidiuretic hormone: Secondary | ICD-10-CM | POA: Insufficient documentation

## 2014-06-13 LAB — CREATININE, URINE, RANDOM: Creatinine, Urine: 167.4 mg/dL

## 2014-06-13 LAB — OSMOLALITY: Osmolality: 270 mOsm/kg — ABNORMAL LOW (ref 275–300)

## 2014-06-13 LAB — SODIUM, URINE, RANDOM: Sodium, Ur: 49 mEq/L

## 2014-06-13 MED ORDER — INSULIN PEN NEEDLE 31G X 8 MM MISC: 1.0000 | Freq: Three times a day (TID) | Status: DC

## 2014-06-13 NOTE — Telephone Encounter (Signed)
Pen needles refilled 

## 2014-06-20 NOTE — Telephone Encounter (Signed)
When patient came in on 05/26/14 I went to the exam room and gave her the information. I actually talked with the interpreter and her son and gave them the below information.

## 2014-09-12 ENCOUNTER — Ambulatory Visit: Payer: Medicaid Other | Admitting: Family Medicine

## 2014-09-24 ENCOUNTER — Inpatient Hospital Stay (HOSPITAL_COMMUNITY): Payer: Medicaid Other

## 2014-09-24 ENCOUNTER — Inpatient Hospital Stay (HOSPITAL_COMMUNITY)
Admission: EM | Admit: 2014-09-24 | Discharge: 2014-09-28 | DRG: 872 | Disposition: A | Discharge status: Home / Self Care | Payer: Medicaid Other | Attending: Internal Medicine | Admitting: Internal Medicine

## 2014-09-24 ENCOUNTER — Encounter (HOSPITAL_COMMUNITY): Payer: Self-pay | Admitting: Emergency Medicine

## 2014-09-24 DIAGNOSIS — N12 Tubulo-interstitial nephritis, not specified as acute or chronic: Secondary | ICD-10-CM | POA: Diagnosis not present

## 2014-09-24 DIAGNOSIS — Z96641 Presence of right artificial hip joint: Secondary | ICD-10-CM | POA: Diagnosis present

## 2014-09-24 DIAGNOSIS — N183 Chronic kidney disease, stage 3 (moderate): Secondary | ICD-10-CM | POA: Diagnosis present

## 2014-09-24 DIAGNOSIS — R109 Unspecified abdominal pain: Secondary | ICD-10-CM

## 2014-09-24 DIAGNOSIS — H9193 Unspecified hearing loss, bilateral: Secondary | ICD-10-CM | POA: Diagnosis present

## 2014-09-24 DIAGNOSIS — I129 Hypertensive chronic kidney disease with stage 1 through stage 4 chronic kidney disease, or unspecified chronic kidney disease: Secondary | ICD-10-CM | POA: Diagnosis present

## 2014-09-24 DIAGNOSIS — Z881 Allergy status to other antibiotic agents status: Secondary | ICD-10-CM | POA: Diagnosis not present

## 2014-09-24 DIAGNOSIS — E872 Acidosis, unspecified: Secondary | ICD-10-CM | POA: Diagnosis present

## 2014-09-24 DIAGNOSIS — Z8619 Personal history of other infectious and parasitic diseases: Secondary | ICD-10-CM

## 2014-09-24 DIAGNOSIS — R1012 Left upper quadrant pain: Secondary | ICD-10-CM | POA: Diagnosis present

## 2014-09-24 DIAGNOSIS — K7460 Unspecified cirrhosis of liver: Secondary | ICD-10-CM | POA: Diagnosis present

## 2014-09-24 DIAGNOSIS — I517 Cardiomegaly: Secondary | ICD-10-CM | POA: Diagnosis present

## 2014-09-24 DIAGNOSIS — B9620 Unspecified Escherichia coli [E. coli] as the cause of diseases classified elsewhere: Secondary | ICD-10-CM | POA: Diagnosis not present

## 2014-09-24 DIAGNOSIS — E785 Hyperlipidemia, unspecified: Secondary | ICD-10-CM | POA: Diagnosis present

## 2014-09-24 DIAGNOSIS — R6520 Severe sepsis without septic shock: Secondary | ICD-10-CM

## 2014-09-24 DIAGNOSIS — A419 Sepsis, unspecified organism: Secondary | ICD-10-CM | POA: Diagnosis not present

## 2014-09-24 DIAGNOSIS — I959 Hypotension, unspecified: Secondary | ICD-10-CM | POA: Diagnosis present

## 2014-09-24 DIAGNOSIS — Z794 Long term (current) use of insulin: Secondary | ICD-10-CM

## 2014-09-24 DIAGNOSIS — K7469 Other cirrhosis of liver: Secondary | ICD-10-CM | POA: Diagnosis not present

## 2014-09-24 DIAGNOSIS — F329 Major depressive disorder, single episode, unspecified: Secondary | ICD-10-CM | POA: Diagnosis present

## 2014-09-24 DIAGNOSIS — I272 Other secondary pulmonary hypertension: Secondary | ICD-10-CM | POA: Diagnosis present

## 2014-09-24 DIAGNOSIS — A4151 Sepsis due to Escherichia coli [E. coli]: Principal | ICD-10-CM | POA: Diagnosis present

## 2014-09-24 DIAGNOSIS — H9190 Unspecified hearing loss, unspecified ear: Secondary | ICD-10-CM | POA: Diagnosis present

## 2014-09-24 DIAGNOSIS — N179 Acute kidney failure, unspecified: Secondary | ICD-10-CM | POA: Diagnosis present

## 2014-09-24 DIAGNOSIS — M810 Age-related osteoporosis without current pathological fracture: Secondary | ICD-10-CM | POA: Diagnosis present

## 2014-09-24 DIAGNOSIS — I5030 Unspecified diastolic (congestive) heart failure: Secondary | ICD-10-CM | POA: Diagnosis present

## 2014-09-24 DIAGNOSIS — E1122 Type 2 diabetes mellitus with diabetic chronic kidney disease: Secondary | ICD-10-CM

## 2014-09-24 DIAGNOSIS — D689 Coagulation defect, unspecified: Secondary | ICD-10-CM | POA: Diagnosis present

## 2014-09-24 DIAGNOSIS — Z96653 Presence of artificial knee joint, bilateral: Secondary | ICD-10-CM | POA: Diagnosis present

## 2014-09-24 DIAGNOSIS — R0600 Dyspnea, unspecified: Secondary | ICD-10-CM | POA: Diagnosis not present

## 2014-09-24 DIAGNOSIS — Z96649 Presence of unspecified artificial hip joint: Secondary | ICD-10-CM | POA: Diagnosis present

## 2014-09-24 DIAGNOSIS — Z8614 Personal history of Methicillin resistant Staphylococcus aureus infection: Secondary | ICD-10-CM | POA: Diagnosis not present

## 2014-09-24 DIAGNOSIS — N10 Acute tubulo-interstitial nephritis: Secondary | ICD-10-CM | POA: Diagnosis not present

## 2014-09-24 DIAGNOSIS — E1165 Type 2 diabetes mellitus with hyperglycemia: Secondary | ICD-10-CM | POA: Diagnosis present

## 2014-09-24 DIAGNOSIS — N390 Urinary tract infection, site not specified: Secondary | ICD-10-CM | POA: Diagnosis present

## 2014-09-24 DIAGNOSIS — T8459XA Infection and inflammatory reaction due to other internal joint prosthesis, initial encounter: Secondary | ICD-10-CM | POA: Diagnosis present

## 2014-09-24 DIAGNOSIS — R932 Abnormal findings on diagnostic imaging of liver and biliary tract: Secondary | ICD-10-CM

## 2014-09-24 LAB — LIPASE, BLOOD: Lipase: 20 U/L — ABNORMAL LOW (ref 22–51)

## 2014-09-24 LAB — GLUCOSE, CAPILLARY
GLUCOSE-CAPILLARY: 137 mg/dL — AB (ref 65–99)
GLUCOSE-CAPILLARY: 198 mg/dL — AB (ref 65–99)
Glucose-Capillary: 179 mg/dL — ABNORMAL HIGH (ref 65–99)
Glucose-Capillary: 153 mg/dL — ABNORMAL HIGH (ref 65–99)

## 2014-09-24 LAB — CBC WITH DIFFERENTIAL/PLATELET
BASOS ABS: 0 10*3/uL (ref 0.0–0.1)
Basophils Relative: 0 % (ref 0–1)
Eosinophils Absolute: 0 10*3/uL (ref 0.0–0.7)
Eosinophils Relative: 0 % (ref 0–5)
HCT: 32.9 % — ABNORMAL LOW (ref 36.0–46.0)
Hemoglobin: 11.1 g/dL — ABNORMAL LOW (ref 12.0–15.0)
Lymphocytes Relative: 5 % — ABNORMAL LOW (ref 12–46)
Lymphs Abs: 0.4 10*3/uL — ABNORMAL LOW (ref 0.7–4.0)
MCH: 27 pg (ref 26.0–34.0)
MCHC: 33.7 g/dL (ref 30.0–36.0)
MCV: 80 fL (ref 78.0–100.0)
MONO ABS: 0.2 10*3/uL (ref 0.1–1.0)
MONOS PCT: 3 % (ref 3–12)
NEUTROS ABS: 7.4 10*3/uL (ref 1.7–7.7)
NEUTROS PCT: 92 % — AB (ref 43–77)
Platelets: 131 10*3/uL — ABNORMAL LOW (ref 150–400)
RBC: 4.11 MIL/uL (ref 3.87–5.11)
RDW: 14.4 % (ref 11.5–15.5)
WBC: 8 10*3/uL (ref 4.0–10.5)

## 2014-09-24 LAB — COMPREHENSIVE METABOLIC PANEL
ALT: 22 U/L (ref 14–54)
AST: 27 U/L (ref 15–41)
Albumin: 2.7 g/dL — ABNORMAL LOW (ref 3.5–5.0)
Alkaline Phosphatase: 112 U/L (ref 38–126)
Anion gap: 11 (ref 5–15)
BUN: 22 mg/dL — ABNORMAL HIGH (ref 6–20)
CALCIUM: 7.9 mg/dL — AB (ref 8.9–10.3)
CHLORIDE: 101 mmol/L (ref 101–111)
CO2: 17 mmol/L — ABNORMAL LOW (ref 22–32)
Creatinine, Ser: 1.4 mg/dL — ABNORMAL HIGH (ref 0.44–1.00)
GFR calc non Af Amer: 35 mL/min — ABNORMAL LOW (ref >60)
GFR, EST AFRICAN AMERICAN: 41 mL/min — AB (ref >60)
Glucose, Bld: 221 mg/dL — ABNORMAL HIGH (ref 65–99)
Potassium: 3.6 mmol/L (ref 3.5–5.1)
SODIUM: 129 mmol/L — AB (ref 135–145)
Total Bilirubin: 0.9 mg/dL (ref 0.3–1.2)
Total Protein: 5.7 g/dL — ABNORMAL LOW (ref 6.5–8.1)

## 2014-09-24 LAB — PROTIME-INR
INR: 1.57 — ABNORMAL HIGH (ref 0.00–1.49)
PROTHROMBIN TIME: 18.8 s — AB (ref 11.6–15.2)

## 2014-09-24 LAB — URINALYSIS, ROUTINE W REFLEX MICROSCOPIC
Bilirubin Urine: NEGATIVE
Glucose, UA: 250 mg/dL — AB
KETONES UR: NEGATIVE mg/dL
NITRITE: NEGATIVE
PROTEIN: 100 mg/dL — AB
Specific Gravity, Urine: 1.011 (ref 1.005–1.030)
UROBILINOGEN UA: 0.2 mg/dL (ref 0.0–1.0)
pH: 6 (ref 5.0–8.0)

## 2014-09-24 LAB — CLOSTRIDIUM DIFFICILE BY PCR: CDIFFPCR: NEGATIVE

## 2014-09-24 LAB — URINE MICROSCOPIC-ADD ON

## 2014-09-24 LAB — MRSA PCR SCREENING: MRSA by PCR: NEGATIVE

## 2014-09-24 LAB — LACTIC ACID, PLASMA: Lactic Acid, Venous: 1.4 mmol/L (ref 0.5–2.0)

## 2014-09-24 LAB — APTT: aPTT: 33 seconds (ref 24–37)

## 2014-09-24 LAB — PROCALCITONIN: Procalcitonin: 18.59 ng/mL

## 2014-09-24 LAB — I-STAT CG4 LACTIC ACID, ED: LACTIC ACID, VENOUS: 1.29 mmol/L (ref 0.5–2.0)

## 2014-09-24 MED ORDER — INSULIN ASPART 100 UNIT/ML ~~LOC~~ SOLN
3.0000 [IU] | Freq: Three times a day (TID) | SUBCUTANEOUS | Status: DC
  Administered 2014-09-25 – 2014-09-28 (×11): 3 [IU] via SUBCUTANEOUS

## 2014-09-24 MED ORDER — IOHEXOL 300 MG/ML  SOLN: 25.0000 mL | Freq: Once | INTRAMUSCULAR | Status: AC | PRN

## 2014-09-24 MED ORDER — SODIUM CHLORIDE 0.9 % IJ SOLN
3.0000 mL | Freq: Two times a day (BID) | INTRAMUSCULAR | Status: DC
  Administered 2014-09-24 – 2014-09-28 (×3): 3 mL via INTRAVENOUS

## 2014-09-24 MED ORDER — HEPARIN SODIUM (PORCINE) 5000 UNIT/ML IJ SOLN
5000.0000 [IU] | Freq: Three times a day (TID) | INTRAMUSCULAR | Status: DC
  Administered 2014-09-24 – 2014-09-28 (×12): 5000 [IU] via SUBCUTANEOUS
  Filled 2014-09-24 (×15): qty 1

## 2014-09-24 MED ORDER — CETYLPYRIDINIUM CHLORIDE 0.05 % MT LIQD
7.0000 mL | Freq: Two times a day (BID) | OROMUCOSAL | Status: DC
  Administered 2014-09-25 (×2): 7 mL via OROMUCOSAL

## 2014-09-24 MED ORDER — IPRATROPIUM-ALBUTEROL 0.5-2.5 (3) MG/3ML IN SOLN
3.0000 mL | RESPIRATORY_TRACT | Status: DC | PRN
  Administered 2014-09-26: 3 mL via RESPIRATORY_TRACT
  Filled 2014-09-24: qty 3

## 2014-09-24 MED ORDER — INSULIN GLARGINE 100 UNIT/ML ~~LOC~~ SOLN
36.0000 [IU] | Freq: Every day | SUBCUTANEOUS | Status: DC
  Administered 2014-09-24 – 2014-09-27 (×4): 36 [IU] via SUBCUTANEOUS
  Filled 2014-09-24 (×5): qty 0.36

## 2014-09-24 MED ORDER — SODIUM CHLORIDE 0.9 % IV SOLN
430.0000 mg | INTRAVENOUS | Status: DC
  Administered 2014-09-24: 430 mg via INTRAVENOUS
  Filled 2014-09-24 (×2): qty 8.6

## 2014-09-24 MED ORDER — ONDANSETRON HCL 4 MG/2ML IJ SOLN: 4.0000 mg | Freq: Four times a day (QID) | INTRAMUSCULAR | Status: DC | PRN

## 2014-09-24 MED ORDER — CEFTRIAXONE SODIUM IN DEXTROSE 20 MG/ML IV SOLN
1.0000 g | INTRAVENOUS | Status: DC
  Administered 2014-09-25 – 2014-09-27 (×3): 1 g via INTRAVENOUS
  Filled 2014-09-24 (×3): qty 50

## 2014-09-24 MED ORDER — IOHEXOL 300 MG/ML  SOLN
25.0000 mL | Freq: Once | INTRAMUSCULAR | Status: AC | PRN
  Administered 2014-09-24: 25 mL via ORAL

## 2014-09-24 MED ORDER — POTASSIUM CHLORIDE IN NACL 20-0.9 MEQ/L-% IV SOLN
INTRAVENOUS | Status: AC
  Administered 2014-09-24: 11:00:00 via INTRAVENOUS
  Filled 2014-09-24: qty 1000

## 2014-09-24 MED ORDER — GUAIFENESIN ER 600 MG PO TB12
1200.0000 mg | ORAL_TABLET | Freq: Two times a day (BID) | ORAL | Status: DC
  Administered 2014-09-24 – 2014-09-27 (×7): 1200 mg via ORAL
  Filled 2014-09-24 (×9): qty 2

## 2014-09-24 MED ORDER — CHLORHEXIDINE GLUCONATE 0.12 % MT SOLN
15.0000 mL | Freq: Two times a day (BID) | OROMUCOSAL | Status: DC
  Administered 2014-09-24 – 2014-09-26 (×3): 15 mL via OROMUCOSAL
  Filled 2014-09-24 (×6): qty 15

## 2014-09-24 MED ORDER — PIPERACILLIN-TAZOBACTAM 3.375 G IVPB
3.3750 g | Freq: Three times a day (TID) | INTRAVENOUS | Status: DC
  Administered 2014-09-24: 3.375 g via INTRAVENOUS
  Filled 2014-09-24 (×3): qty 50

## 2014-09-24 MED ORDER — ONDANSETRON HCL 4 MG PO TABS: 4.0000 mg | ORAL_TABLET | Freq: Four times a day (QID) | ORAL | Status: DC | PRN

## 2014-09-24 MED ORDER — LINEZOLID 600 MG/300ML IV SOLN
600.0000 mg | Freq: Once | INTRAVENOUS | Status: DC
  Filled 2014-09-24: qty 300

## 2014-09-24 MED ORDER — ACETAMINOPHEN 160 MG/5ML PO SUSP
650.0000 mg | Freq: Four times a day (QID) | ORAL | Status: DC | PRN
  Administered 2014-09-24: 650 mg via ORAL
  Filled 2014-09-24 (×2): qty 20.3

## 2014-09-24 MED ORDER — IPRATROPIUM-ALBUTEROL 0.5-2.5 (3) MG/3ML IN SOLN
3.0000 mL | Freq: Four times a day (QID) | RESPIRATORY_TRACT | Status: DC
  Administered 2014-09-24: 3 mL via RESPIRATORY_TRACT
  Filled 2014-09-24: qty 3

## 2014-09-24 MED ORDER — INSULIN ASPART 100 UNIT/ML ~~LOC~~ SOLN
0.0000 [IU] | Freq: Three times a day (TID) | SUBCUTANEOUS | Status: DC
  Administered 2014-09-24: 3 [IU] via SUBCUTANEOUS
  Administered 2014-09-24 – 2014-09-25 (×2): 2 [IU] via SUBCUTANEOUS
  Administered 2014-09-25 – 2014-09-26 (×4): 3 [IU] via SUBCUTANEOUS
  Administered 2014-09-26: 5 [IU] via SUBCUTANEOUS

## 2014-09-24 MED ORDER — ALUM & MAG HYDROXIDE-SIMETH 200-200-20 MG/5ML PO SUSP: 30.0000 mL | Freq: Four times a day (QID) | ORAL | Status: DC | PRN

## 2014-09-24 MED ORDER — SODIUM CHLORIDE 0.9 % IV BOLUS (SEPSIS)
1000.0000 mL | Freq: Once | INTRAVENOUS | Status: AC
  Administered 2014-09-24: 1000 mL via INTRAVENOUS

## 2014-09-24 MED ORDER — PRAVASTATIN SODIUM 20 MG PO TABS
20.0000 mg | ORAL_TABLET | Freq: Every day | ORAL | Status: DC
  Filled 2014-09-24: qty 1

## 2014-09-24 MED ORDER — DEXTROSE 5 % IV SOLN
1.0000 g | Freq: Once | INTRAVENOUS | Status: AC
  Administered 2014-09-24: 1 g via INTRAVENOUS
  Filled 2014-09-24: qty 10

## 2014-09-24 MED ORDER — DEXTROMETHORPHAN POLISTIREX ER 30 MG/5ML PO SUER
30.0000 mg | Freq: Two times a day (BID) | ORAL | Status: DC
  Administered 2014-09-24 – 2014-09-27 (×7): 30 mg via ORAL
  Filled 2014-09-24 (×8): qty 5

## 2014-09-24 NOTE — Consult Note (Signed)
[facility]     Reason for Consult: pyelitis    Referring Physician: Dr. Doyle Askew  Principal Problem:   Sepsis Active Problems:   Diabetes mellitus type II, controlled   UTI (lower urinary tract infection)   Acute renal failure   Emphysematous pyelitis   Hepatic cirrhosis   Metabolic acidosis   . dextromethorphan  30 mg Oral BID  . guaiFENesin  1,200 mg Oral BID  . heparin  5,000 Units Subcutaneous 3 times per day  . insulin aspart  0-15 Units Subcutaneous TID WC  . insulin aspart  3 Units Subcutaneous TID WC  . insulin glargine  36 Units Subcutaneous QHS  . piperacillin-tazobactam (ZOSYN)  IV  3.375 g Intravenous Q8H  . sodium chloride  3 mL Intravenous Q12H    Recommendations: Ceftriaxone for pyelitis  Cancel C diff test Cancel enteric precautions D/c daptomycin and zosyn  Assessment: Joyce Gilmore has pyelonephritis with gas noted on CT concerning for emphysematous pyelitis.   History of E coli, ceftriaxone sensitive, FQ resistant.   Symptoms of N/V, diarrhea, fever likely c/w above.  Not consistent with C diff.   Antibiotics: Ceftriaxone, daptomycin, zosyn  HPI: Joyce Gilmore is a 80 y.o. female with history of right prosthetic hip infection with MRSA, antibiotic allergies with vancomycin, rifampin and linezolid (all thrombocytopenia) who presented today with N/V, diarrhea, fever, abdominal pain.  CT scan notable for renal stranding and possible emphysematous pyelitis of right (atrophied) kidney. No burning or pain with urination.  Spent 3 months in [place] and returned 6 days ago.  No sick contacts.  No SOB, no cough.  No rashes.     Review of Systems: A comprehensive review of systems was negative except for: Constitutional: positive for anorexia and malaise  Past Medical History  Diagnosis Date  . Hypertension   . Depression     hx of   . Diabetes mellitus without complication   . Chronic kidney disease     hx of kidney stones,   .  Arthritis   . Hearing loss of both ears   . Tinnitus of both ears   . MRSA infection     Oct 13 - Nov 13  . Ganglion, left ankle and foot   . Osteoporosis     History  Substance Use Topics  . Smoking status: Never Smoker   . Smokeless tobacco: Never Used  . Alcohol Use: No    Family History  Problem Relation Age of Onset  . Colon cancer Neg Hx   . Hypertension Mother    Allergies  Allergen Reactions  . Bactrim [Sulfamethoxazole-Trimethoprim]     Hyperkalemia  . Rifampin Other (See Comments)    Severe thrombocytopenia  . Vancomycin Other (See Comments)    Severe thrombocytopenia    OBJECTIVE: Blood pressure 94/42, pulse 95, temperature 99.8 F (37.7 C), temperature source Oral, resp. rate 29, height 4\' 8"  (1.422 m), weight 159 lb 6.3 oz (72.3 kg), SpO2 97 %. General: awake, alert, moderate distress with discomfort HEENT: anicteric Skin: no rashes Lungs: CTA B Cor: RRR without m Abdomen: soft, mild diffuse tenderness, normoactive bowel sounds Ext: no edema Neuro: non focal LAD: no cervical or supraclavicular lad  Microbiology: Recent Results (from the past 240 hour(s))  Blood culture (routine x 2)     Status: None (Preliminary result)   Collection Time: 09/24/14  7:35 AM  Result Value Ref Range Status   Specimen Description BLOOD RIGHT ANTECUBITAL  Final  Special Requests BOTTLES DRAWN AEROBIC AND ANAEROBIC 10CC  Final   Culture PENDING  Incomplete   Report Status PENDING  Incomplete  Blood culture (routine x 2)     Status: None (Preliminary result)   Collection Time: 09/24/14  7:45 AM  Result Value Ref Range Status   Specimen Description BLOOD RIGHT HAND  Final   Special Requests BOTTLES DRAWN AEROBIC AND ANAEROBIC 10CC  Final   Culture PENDING  Incomplete   Report Status PENDING  Incomplete    Scharlene Gloss, [place] [facility] [email or web address] [phone] pager  [phone] cell 09/24/2014, 3:01 PM

## 2014-09-24 NOTE — ED Provider Notes (Signed)
CSN: [id number]   Arrival date & time 09/24/14 5638  History  This chart was scribed for  Debby Freiberg, MD by Altamease Oiler, ED Scribe. This patient was seen in room B19C/B19C and the patient's care was started at 3:31 AM.  Chief Complaint  Patient presents with  . Emesis  . Diarrhea  . Fever    HPI LEVEL 5 CAVEAT FOR LANGUAGE BARRIER The history is provided by the EMS personnel.   Brought in by EMS, Joyce Gilmore is a 80 y.o. female who presents to the Emergency Department complaining of diarrhea with onset yesterday. Associated symptoms include nausea and 1 episode of emesis. Pt was febrile with EMS and given Tylenol.   Pt is non-english speaking but her family is on the way to the ED.   Past Medical History  Diagnosis Date  . Hypertension   . Depression     hx of   . Diabetes mellitus without complication   . Chronic kidney disease     hx of kidney stones,   . Arthritis   . Hearing loss of both ears   . Tinnitus of both ears   . MRSA infection     Oct 13 - Nov 13  . Ganglion, left ankle and foot   . Osteoporosis     Past Surgical History  Procedure Laterality Date  . Total hip revision  12/05/2011    Procedure: TOTAL HIP REVISION;  Surgeon: Mcarthur Rossetti, MD;  Location: [facility];  Service: Orthopedics;  Laterality: Right;  Right Hip Revision Arthroplasty to Total Hip, Excision of Old Implant  . Incision and drainage hip  12/22/2011    Procedure: IRRIGATION AND DEBRIDEMENT HIP;  Surgeon: Mcarthur Rossetti, MD;  Location: [facility];  Service: Orthopedics;  Laterality: Right;  Irrigation and debridement right hip  . Incision and drainage hip  12/27/2011    Procedure: IRRIGATION AND DEBRIDEMENT HIP;  Surgeon: Mcarthur Rossetti, MD;  Location: [facility];  Service: Orthopedics;  Laterality: Right;  Repeat irrigation and debridement  . Joint replacement      bilateral knee, right hip   . Back surgery      history restored after error with record merge         Family History  Problem Relation Age of Onset  . Colon cancer Neg Hx   . Hypertension Mother     History  Substance Use Topics  . Smoking status: Never Smoker   . Smokeless tobacco: Never Used  . Alcohol Use: No     Review of Systems  Constitutional: Positive for fever.  Gastrointestinal: Positive for nausea, vomiting and diarrhea.     Home Medications   Prior to Admission medications   Medication Sig Start Date End Date Taking? Authorizing Provider  acetaminophen (TYLENOL) 160 MG/5ML suspension Take 650 mg by mouth every 6 (six) hours as needed for moderate pain.   Yes Historical Provider, MD  insulin aspart (NOVOLOG) 100 UNIT/ML FlexPen Give 6 units with Lunch 04/28/14  Yes Alycia Rossetti, MD  Insulin Glargine (LANTUS SOLOSTAR) 100 UNIT/ML Solostar Pen Inject 40 Units into the skin at bedtime. Patient taking differently: Inject 36 Units into the skin at bedtime.  04/28/14  Yes Alycia Rossetti, MD  lansoprazole (PREVACID) 30 MG capsule Take 1 capsule (30 mg total) by mouth 2 (two) times daily before a meal. 04/28/14  Yes Alycia Rossetti, MD  lisinopril (PRINIVIL,ZESTRIL) 10 MG tablet Take 1 tablet (10 mg total) by mouth  daily. 04/28/14  Yes Alycia Rossetti, MD  Oxycodone HCl 10 MG TABS Take 1 tablet (10 mg total) by mouth every 6 (six) hours as needed. Patient taking differently: Take 10 mg by mouth every 6 (six) hours as needed (pain).  06/01/14  Yes Mary B Dixon, PA-C  pravastatin (PRAVACHOL) 20 MG tablet Take 20 mg by mouth daily.   Yes Historical Provider, MD  sucralfate (CARAFATE) 1 G tablet Take 1 tablet (1 g total) by mouth 4 (four) times daily. 04/28/14  Yes Alycia Rossetti, MD  ACCU-CHEK AVIVA PLUS test strip USE AS DIRECTED UP TO FOUR TIMES DAILY 06/09/14   Alycia Rossetti, MD  amoxicillin (AMOXIL) 500 MG capsule Take 1 capsule (500 mg total) by mouth 3 (three) times daily. Patient not taking: Reported on 09/24/2014 06/03/14   Tanna Furry, MD  blood glucose meter kit and  supplies KIT Dispense based on patient and insurance preference. Use up to four times daily as directed. (FOR ICD-9 250.00, 250.01). 04/25/14   Orlena Sheldon, PA-C  fluticasone [facility]) 50 MCG/ACT nasal spray 1 spray each nares bid Patient not taking: Reported on 09/24/2014 06/03/14   Tanna Furry, MD  Insulin Pen Needle (B-D ULTRAFINE III SHORT PEN) 31G X 8 MM MISC 1 each by Does not apply route 3 (three) times daily. 06/13/14   Alycia Rossetti, MD  metoCLOPramide (REGLAN) 10 MG tablet Take 1 tablet (10 mg total) by mouth 3 (three) times daily before meals. Patient not taking: Reported on 09/24/2014 06/02/14   Susy Frizzle, MD  naproxen (NAPROSYN) 375 MG tablet Take 1 tablet (375 mg total) by mouth 2 (two) times daily with a meal. For arthritis Patient not taking: Reported on 09/24/2014 05/26/14   Alycia Rossetti, MD  nitrofurantoin, macrocrystal-monohydrate, (MACROBID) 100 MG capsule Take 1 capsule (100 mg total) by mouth 2 (two) times daily. Patient not taking: Reported on 09/24/2014 05/29/14   Alycia Rossetti, MD  ondansetron (ZOFRAN ODT) 4 MG disintegrating tablet Take 1 tablet (4 mg total) by mouth every 8 (eight) hours as needed for nausea. Patient not taking: Reported on 09/24/2014 06/03/14   Tanna Furry, MD  ondansetron (ZOFRAN) 4 MG tablet Take 1 tablet (4 mg total) by mouth every 6 (six) hours as needed for nausea or vomiting. Patient not taking: Reported on 09/24/2014 12/28/13   Orlena Sheldon, PA-C  ondansetron (ZOFRAN) 4 MG tablet Take 1 tablet (4 mg total) by mouth every 8 (eight) hours as needed for nausea or vomiting. Patient not taking: Reported on 09/24/2014 06/02/14   Susy Frizzle, MD    Allergies  Bactrim; Rifampin; and Vancomycin  Triage Vitals: BP 157/97 mmHg  Pulse 114  Temp(Src) 103.1 F (39.5 C) (Oral)  Resp 20  SpO2 95%  Physical Exam  Constitutional: She is oriented to person, place, and time. She appears well-developed and well-nourished.  HENT:  Head: Normocephalic and  atraumatic.  Right Ear: External ear normal.  Left Ear: External ear normal.  Eyes: Conjunctivae and EOM are normal. Pupils are equal, round, and reactive to light.  Neck: Normal range of motion. Neck supple.  Cardiovascular: Normal rate, regular rhythm, normal heart sounds and intact distal pulses.   Pulmonary/Chest: Effort normal and breath sounds normal.  Abdominal: Soft. Bowel sounds are normal. There is generalized tenderness.  Musculoskeletal: Normal range of motion.  Neurological: She is alert and oriented to person, place, and time.  Skin: Skin is warm and dry.  Vitals reviewed.  ED Course  Procedures   DIAGNOSTIC STUDIES: Oxygen Saturation is 95% on RA, normal by my interpretation.    COORDINATION OF CARE: 3:34 AM Treatment plan includes   Labs Review-  Labs Reviewed  CBC WITH DIFFERENTIAL/PLATELET - Abnormal; Notable for the following:    Hemoglobin 11.1 (*)    HCT 32.9 (*)    Platelets 131 (*)    Neutrophils Relative % 92 (*)    Lymphocytes Relative 5 (*)    Lymphs Abs 0.4 (*)    All other components within normal limits  COMPREHENSIVE METABOLIC PANEL - Abnormal; Notable for the following:    Sodium 129 (*)    CO2 17 (*)    Glucose, Bld 221 (*)    BUN 22 (*)    Creatinine, Ser 1.40 (*)    Calcium 7.9 (*)    Total Protein 5.7 (*)    Albumin 2.7 (*)    GFR calc non Af Amer 35 (*)    GFR calc Af Amer 41 (*)    All other components within normal limits  LIPASE, BLOOD - Abnormal; Notable for the following:    Lipase 20 (*)    All other components within normal limits  URINALYSIS, ROUTINE W REFLEX MICROSCOPIC (NOT AT [facility]) - Abnormal; Notable for the following:    APPearance CLOUDY (*)    Glucose, UA 250 (*)    Hgb urine dipstick MODERATE (*)    Protein, ur 100 (*)    Leukocytes, UA MODERATE (*)    All other components within normal limits  URINE MICROSCOPIC-ADD ON - Abnormal; Notable for the following:    Bacteria, UA MANY (*)    All other components  within normal limits  URINE CULTURE  CULTURE, BLOOD (ROUTINE X 2)  CULTURE, BLOOD (ROUTINE X 2)  LACTIC ACID, PLASMA  LACTIC ACID, PLASMA  PROCALCITONIN  PROTIME-INR  APTT  I-STAT CG4 LACTIC ACID, ED    Imaging Review No results found.  EKG Interpretation  Date/Time:    Ventricular Rate:    PR Interval:    QRS Duration:   QT Interval:    QTC Calculation:   R Axis:     Text Interpretation:       CRITICAL CARE Performed by: Debby Freiberg   Total critical care time: 35 min  Critical care time was exclusive of separately billable procedures and treating other patients.  Critical care was necessary to treat or prevent imminent or life-threatening deterioration.  Critical care was time spent personally by me on the following activities: development of treatment plan with patient and/or surrogate as well as nursing, discussions with consultants, evaluation of patient's response to treatment, examination of patient, obtaining history from patient or surrogate, ordering and performing treatments and interventions, ordering and review of laboratory studies, ordering and review of radiographic studies, pulse oximetry and re-evaluation of patient's condition.   MDM   Final diagnoses:  Pain in the abdomen     80 y.o. female with pertinent PMH of HTN, DM, CKD presents with nausea, vomiting, fever, diarrhea as above. On arrival the patient was febrile to 103, this improved after Tylenol. She had generalized abdominal tenderness. Workup revealed urinary tract infection, sepsis. Patient given Rocephin, normal saline bolus, blood cultures drawn. Admitted to stepdown.    I have reviewed all laboratory and imaging studies if ordered as above  1. Pain in the abdomen          Debby Freiberg, MD 09/24/14 [phone]

## 2014-09-24 NOTE — ED Notes (Signed)
Admitting at bedside with patient and family.

## 2014-09-24 NOTE — ED Notes (Signed)
Pt. arrived with EMS from home reports diarrhea , fever , chills and emesis onset last Tuesday , pt. received Tylenol 1 gram po and Zofran 4 mg IV by EMS prior to arrival with relief.

## 2014-09-24 NOTE — H&P (Signed)
[facility] History and Physical                                                                                    Joyce Gilmore, is a 80 y.o. female  MRN: [id number]   DOB - Aug 24, 1934  Admit Date - 09/24/2014  Outpatient Primary MD for the patient is Vic Blackbird, MD  Referring Physician:  Dr. Colin Rhein  Chief Complaint:   Chief Complaint  Patient presents with  . Emesis  . Diarrhea  . Fever     HPI  Joyce Gilmore  is a 80 y.o. female who speaks only Spanish, with diabetes, chronic kidney disease.  She presented to the emergency department today with vomiting, diarrhea, and elevated glucose readings. Her daughter who speaks some English provides the history.  The patient has been in [place] for the past 3 months and return on Monday. She had elevated CBGs. She started feeling poorly and progressively got worse with nausea and elevated CBGs. On Saturday she developed vomiting, subjective fever and severe abdominal pain.  The pain is constant. It stops her from eating. She is only able to keep down liquids. Nothing appears to relieve the pain. She indicates the pain is in her left upper quadrant.  In the emergency department she is found to have a temperature of 103.3, and she has hypotensive (91/46). Her respirations are 23.  Creatinine is 1.4.  Coags are elevated PTT 18.8, INR 1.57. She is not on anticoagulation.  Review of Systems   + + Productive cough with white sputum + + Fever. No changes in vision or headache + + Sore throat, + + vomiting without hematemesis + + Abdominal pain in the left upper quadrant + + Diarrhea without melanoma She reports no rash, bruises or lesions She reports no myalgias   Past Medical History  Past Medical History  Diagnosis Date  . Hypertension   . Depression     hx of   . Diabetes mellitus without complication   . Chronic kidney disease     hx of kidney stones,   . Arthritis   . Hearing loss of both ears   . Tinnitus of both ears    . MRSA infection     Oct 13 - Nov 13  . Ganglion, left ankle and foot   . Osteoporosis     Past Surgical History  Procedure Laterality Date  . Total hip revision  12/05/2011    Procedure: TOTAL HIP REVISION;  Surgeon: Mcarthur Rossetti, MD;  Location: [facility];  Service: Orthopedics;  Laterality: Right;  Right Hip Revision Arthroplasty to Total Hip, Excision of Old Implant  . Incision and drainage hip  12/22/2011    Procedure: IRRIGATION AND DEBRIDEMENT HIP;  Surgeon: Mcarthur Rossetti, MD;  Location: [facility];  Service: Orthopedics;  Laterality: Right;  Irrigation and debridement right hip  . Incision and drainage hip  12/27/2011    Procedure: IRRIGATION AND DEBRIDEMENT HIP;  Surgeon: Mcarthur Rossetti, MD;  Location: [facility];  Service: Orthopedics;  Laterality: Right;  Repeat irrigation and debridement  . Joint replacement      bilateral knee, right hip   .  Back surgery      history restored after error with record merge          Social History History  Substance Use Topics  . Smoking status: Never Smoker   . Smokeless tobacco: Never Used  . Alcohol Use: No   lives with her daughter. Independent with ADLs.  Family History Family History  Problem Relation Age of Onset  . Colon cancer Neg Hx   . Hypertension Mother     Prior to Admission medications   Medication Sig Start Date End Date Taking? Authorizing Provider  acetaminophen (TYLENOL) 160 MG/5ML suspension Take 650 mg by mouth every 6 (six) hours as needed for moderate pain.   Yes Historical Provider, MD  insulin aspart (NOVOLOG) 100 UNIT/ML FlexPen Give 6 units with Lunch 04/28/14  Yes Alycia Rossetti, MD  Insulin Glargine (LANTUS SOLOSTAR) 100 UNIT/ML Solostar Pen Inject 40 Units into the skin at bedtime. Patient taking differently: Inject 36 Units into the skin at bedtime.  04/28/14  Yes Alycia Rossetti, MD  lansoprazole (PREVACID) 30 MG capsule Take 1 capsule (30 mg total) by mouth 2 (two) times daily before  a meal. 04/28/14  Yes Alycia Rossetti, MD  lisinopril (PRINIVIL,ZESTRIL) 10 MG tablet Take 1 tablet (10 mg total) by mouth daily. 04/28/14  Yes Alycia Rossetti, MD  pravastatin (PRAVACHOL) 20 MG tablet Take 20 mg by mouth daily.   Yes Historical Provider, MD  sucralfate (CARAFATE) 1 G tablet Take 1 tablet (1 g total) by mouth 4 (four) times daily. 04/28/14  Yes Alycia Rossetti, MD  ACCU-CHEK AVIVA PLUS test strip USE AS DIRECTED UP TO FOUR TIMES DAILY 06/09/14   Alycia Rossetti, MD  blood glucose meter kit and supplies KIT Dispense based on patient and insurance preference. Use up to four times daily as directed. (FOR ICD-9 250.00, 250.01). 04/25/14   Orlena Sheldon, PA-C  Insulin Pen Needle (B-D ULTRAFINE III SHORT PEN) 31G X 8 MM MISC 1 each by Does not apply route 3 (three) times daily. 06/13/14   Alycia Rossetti, MD    Allergies  Allergen Reactions  . Bactrim [Sulfamethoxazole-Trimethoprim]     Hyperkalemia  . Rifampin Other (See Comments)    Severe thrombocytopenia  . Vancomycin Other (See Comments)    Severe thrombocytopenia    Physical Exam  Vitals  Blood pressure 92/55, pulse 77, temperature 97.7 F (36.5 C), temperature source Axillary, resp. rate 21, height $RemoveBe'4\' 8"'EzeCPltAv$  (1.422 m), weight 72.3 kg (159 lb 6.3 oz), SpO2 100 %.   General: Well-developed, elderly female lying in bed.  Non-English-speaking. Daughter at bedside  Neuro:   No F.N deficits, ALL C.Nerves Intact, Strength 5/5 all 4 extremities  ENT:  Ears and Eyes appear Normal, Conjunctivae clear, PER. Dry mucous membranes  Neck:  Supple, No lymphadenopathy appreciated  Respiratory:  Bilateral minimal wheezes, good air movement  Cardiac:  RRR, No Murmurs, no LE edema noted, no JVD.    Abdomen:  Positive bowel sounds, Soft, Non tender, Non distended,  No masses appreciated  Skin:  No Cyanosis, Normal Skin Turgor, No Skin Rash or Bruise.  Extremities:  Able to move all 4. 5/5 strength in each,  no effusions.  Data  Review  CBC  Recent Labs Lab 09/24/14 0425  WBC 8.0  HGB 11.1*  HCT 32.9*  PLT 131*  MCV 80.0  MCH 27.0  MCHC 33.7  RDW 14.4  LYMPHSABS 0.4*  MONOABS 0.2  EOSABS 0.0  BASOSABS  0.0    Chemistries   Recent Labs Lab 09/24/14 0425  NA 129*  K 3.6  CL 101  CO2 17*  GLUCOSE 221*  BUN 22*  CREATININE 1.40*  CALCIUM 7.9*  AST 27  ALT 22  ALKPHOS 112  BILITOT 0.9    Coagulation profile  Recent Labs Lab 09/24/14 0745  INR 1.57*   Urinalysis    Component Value Date/Time   COLORURINE YELLOW 09/24/2014 0422   APPEARANCEUR CLOUDY* 09/24/2014 0422   LABSPEC 1.011 09/24/2014 0422   PHURINE 6.0 09/24/2014 0422   GLUCOSEU 250* 09/24/2014 0422   HGBUR MODERATE* 09/24/2014 0422   BILIRUBINUR NEGATIVE 09/24/2014 0422   KETONESUR NEGATIVE 09/24/2014 0422   PROTEINUR 100* 09/24/2014 0422   UROBILINOGEN 0.2 09/24/2014 0422   NITRITE NEGATIVE 09/24/2014 0422   LEUKOCYTESUR MODERATE* 09/24/2014 0422    Imaging results:   Ct Abdomen Pelvis Wo Contrast  09/24/2014   CLINICAL DATA:  80 year old female with diffuse abdominal and pelvic pain, nausea, vomiting, diarrhea and fever for 5 days.  EXAM: CT ABDOMEN AND PELVIS WITHOUT CONTRAST  TECHNIQUE: Multidetector CT imaging of the abdomen and pelvis was performed following the standard protocol without IV contrast.  COMPARISON:  12/22/2013 and prior CTs.  06/03/2014 chest radiograph  FINDINGS: Please note that parenchymal abnormalities may be missed without intravenous contrast.  Right hip replacement metallic artifact obscures detail within the pelvis.  Lower chest: Mild basilar atelectasis noted. Cardiomegaly is again identified.  Hepatobiliary: Slightly nodular hepatic contour and hepatic configuration may represents cirrhosis. Correlate clinically. No focal hepatic lesions are identified. Equivocal mild pericholecystic stranding/ inflammation noted. There is no evidence of biliary dilatation.  Pancreas: Unremarkable  Spleen:  Unremarkable  Adrenals/Urinary Tract: New bilateral perinephric stranding/ inflammation noted which is nonspecific but infection is not excluded. A focus of gas within the right kidney (image 33) is suspicious for early emphysematous pyelitis.  Moderate -severe right renal atrophy and right renal calculi are again identified. It would  There is no evidence of hydronephrosis or obstructing urinary calculi.  Stomach/Bowel: Descending and sigmoid colonic diverticulosis noted without diverticulitis. There is no evidence of bowel obstruction  Vascular/Lymphatic: No enlarged lymph nodes or abdominal aortic aneurysm.  Reproductive: Unchanged with probable uterine fibroid.  Other: No free fluid, pneumoperitoneum or definite abscess.  Musculoskeletal: No acute abnormalities are noted. Right total hip replacement, T8 compression fracture, degenerative changes within the lumbar spine and remote right inferior pubic ramus fracture again identified.  IMPRESSION: New bilateral perinephric stranding/inflammation with focus of gas within the right intrarenal collecting system. This is suspicious for infection, especially on the right which likely represents early emphysematous pyelitis.  Equivocal mild pericholecystic stranding/inflammation. Consider ultrasound or nuclear medicine study if there is strong clinical suspicion for acute cholecystitis.  Possible cirrhosis.  Correlate clinically.  Cardiomegaly.   Electronically Signed   By: Margarette Canada M.D.   On: 09/24/2014 08:43   Dg Chest 2 View  09/24/2014   CLINICAL DATA:  Sepsis  EXAM: CHEST  2 VIEW  COMPARISON:  Radiograph 06/03/2014  FINDINGS: Stable cardiac silhouette. There is chronic central peribronchial thickening. No overt pulmonary edema. Wedge compression fracture in the mid thoracic spine unchanged.  IMPRESSION: 1. Chronic peribronchial thickening not changed. 2. No acute findings.   Electronically Signed   By: Suzy Bouchard M.D.   On: 09/24/2014 08:19    My  personal review of EKG: NSR, No ST changes noted.   Assessment & Plan  Principal Problem:   Sepsis Active  Problems:   Emphysematous pyelitis   Diabetes mellitus type II, controlled   UTI (lower urinary tract infection)   Acute renal failure   Hepatic cirrhosis   Metabolic acidosis  Sepsis with hypotension Patient returned from [place], [place] on Monday.  Suspect sepsis is secondary to pyelitis infection.  Urology and infectious disease on board.  Patient was started on Zosyn and daptomycin. She has multiple antibiotic allergies Blood cultures and urine cultures are pending.  Lactic acid is normal. Patient sounded wet on exam with mildly increased work of breathing. Wanted to avoid volume overload. She received 1 L of fluid in the ER and has been placed on gentle IV hydration. Admit patient to step down.    Emphysematous pyelitis CT scan: bilateral perinephric stranding/inflammation with focus of gas within the right intrarenal collecting system Started on Zosyn and daptomycin. Appreciate infectious disease consultation regarding antibiotics. Have asked Dr. Gaynelle Arabian to offer his recommendations. He will likely see her later in the day. Strict intake and output ordered Patient started on IV daptomycin and Zosyn.  Diarrhea and vomiting Likely due to acute pyelitis, but will rule out other pathogens with a GI pathogen panel and C. difficile PCR. Supportive care with IV fluids and antiemetics.  Acute kidney injury Creatinine elevated to 1.4. IV fluids, antibiotics, hold ACE inhibitor, discontinue NSAIDs  Metabolic acidosis-mild Likely secondary to acute infection IV fluids. Monitor bmet.  Type 2 diabetes uncontrolled Patient is compliant with medications. Likely uncontrolled due to acute infection Will continue home dose Lantus, add sliding scale insulin with meal coverage.  Hepatic cirrhosis on CT I didn't see a previous mention of cirrhosis in her chart review. She  was seen by Santina Evans in November 2015 at their office for epigastric pain. She has coagulopathy with an elevated PT/INR. Will check an acute hepatitis panel She will likely need a liver biopsy and follow-up with gastroenterology outpatient.  Hyperlipidemia Holding pravastatin per pharmacy due to the initiation of daptomycin.   Consultants Called:  Infectious disease, urology  Family Communication:   Daughter bedside  Code Status:  Full code  Condition:  Guarded. Has the potential to decline quickly  Potential Disposition: Hopefully to home in 3-4 days  Time spent in minutes : [address],  PA-C on 09/24/2014 at 10:32 AM Between 7am to 7pm - Pager - [phone] After 7pm go to [email or web address] - password [email or web address] And look for the night coverage person covering me after hours  [facility]

## 2014-09-24 NOTE — ED Notes (Signed)
Pt placed in a gown and hooked up to the monitor with the BP cuff and pulse ox 

## 2014-09-24 NOTE — Consult Note (Signed)
Urology Consult  Referring physician:   Dr. Mart Piggs Reason for referral: Renal infection  Chief Complaint:  Abdominal pain, nausea, vomiting  History of Present Illness:  80 yo Spanish -speaking only [place], admitted via the [facility] ED today with:   T 103.3, BP 91/46, P  R 23,  Cr 1.4,   PTT 18.8,  INR 1.57 ( not on anticoagulation)  1. Nausea, vomiting, diarrhea, and elevated glucose, progressive for 1 week. Constant generalized abdominal pain, LUQ 2. Travelling in [place] for last 3 months ( Zika virus exposure) 3.IDDM  Diabetes 4. Chronic kidney disease 5. MRSA infection 6. Allergy : Bactrim ( hyperkalemia); Rifamin, Vanc ( thrombocytopenia)     Past Medical History  Diagnosis Date  . Hypertension   . Depression     hx of   . Diabetes mellitus without complication   . Chronic kidney disease     hx of kidney stones,   . Arthritis   . Hearing loss of both ears   . Tinnitus of both ears   . MRSA infection     Oct 13 - Nov 13  . Ganglion, left ankle and foot   . Osteoporosis    Past Surgical History  Procedure Laterality Date  . Total hip revision  12/05/2011    Procedure: TOTAL HIP REVISION;  Surgeon: Mcarthur Rossetti, MD;  Location: [facility];  Service: Orthopedics;  Laterality: Right;  Right Hip Revision Arthroplasty to Total Hip, Excision of Old Implant  . Incision and drainage hip  12/22/2011    Procedure: IRRIGATION AND DEBRIDEMENT HIP;  Surgeon: Mcarthur Rossetti, MD;  Location: [place];  Service: Orthopedics;  Laterality: Right;  Irrigation and debridement right hip  . Incision and drainage hip  12/27/2011    Procedure: IRRIGATION AND DEBRIDEMENT HIP;  Surgeon: Mcarthur Rossetti, MD;  Location: [place];  Service: Orthopedics;  Laterality: Right;  Repeat irrigation and debridement  . Joint replacement      bilateral knee, right hip   . Back surgery      history restored after error with record merge        Medications: I have reviewed the patient's current  medications. Allergies:  Allergies  Allergen Reactions  . Bactrim [Sulfamethoxazole-Trimethoprim]     Hyperkalemia  . Rifampin Other (See Comments)    Severe thrombocytopenia  . Vancomycin Other (See Comments)    Severe thrombocytopenia    Family History  Problem Relation Age of Onset  . Colon cancer Neg Hx   . Hypertension Mother    Social History:  reports that she has never smoked. She has never used smokeless tobacco. She reports that she does not drink alcohol or use illicit drugs.  ROS: + + Productive cough with white sputum + + Fever. No changes in vision or headache + + Sore throat, + + vomiting without hematemesis + + Abdominal pain in the left upper quadrant + + Diarrhea without melanoma She reports no rash, bruises or lesions She reports no myalgias   Physical Exam:  Vital signs in last 24 hours: Temp:  [97.7 F (36.5 C)-103.1 F (39.5 C)] 99 F (37.2 C) (07/31 1141) Pulse Rate:  [77-114] 77 (07/31 0915) Resp:  [16-23] 21 (07/31 0915) BP: (91-157)/(39-97) 92/55 mmHg (07/31 0915) SpO2:  [95 %-100 %] 100 % (07/31 0915) Weight:  [72.3 kg (159 lb 6.3 oz)] 72.3 kg (159 lb 6.3 oz) (07/31 0900) Skin: Sallow.  Cardiovascular: Skin warm; not flushed Respiratory: Breaths quiet; no shortness  of breath Abdomen: No masses. Distended. C/o general discomfort.  Neurological: Normal sensation to touch Musculoskeletal: Normal motor function arms and legs Lymphatics: No inguinal adenopathy Skin: No rashes Genitourinary:CVA: no pain.   Laboratory Data:  Results for orders placed or performed during the hospital encounter of 09/24/14 (from the past 72 hour(s))  Urinalysis, Routine w reflex microscopic (not at [facility])     Status: Abnormal   Collection Time: 09/24/14  4:22 AM  Result Value Ref Range   Color, Urine YELLOW YELLOW   APPearance CLOUDY (A) CLEAR   Specific Gravity, Urine 1.011 1.005 - 1.030   pH 6.0 5.0 - 8.0   Glucose, UA 250 (A) NEGATIVE mg/dL   Hgb urine  dipstick MODERATE (A) NEGATIVE   Bilirubin Urine NEGATIVE NEGATIVE   Ketones, ur NEGATIVE NEGATIVE mg/dL   Protein, ur 100 (A) NEGATIVE mg/dL   Urobilinogen, UA 0.2 0.0 - 1.0 mg/dL   Nitrite NEGATIVE NEGATIVE   Leukocytes, UA MODERATE (A) NEGATIVE  Urine microscopic-add on     Status: Abnormal   Collection Time: 09/24/14  4:22 AM  Result Value Ref Range   Squamous Epithelial / LPF RARE RARE   WBC, UA 21-50 <3 WBC/hpf   RBC / HPF 3-6 <3 RBC/hpf   Bacteria, UA MANY (A) RARE  CBC with Differential     Status: Abnormal   Collection Time: 09/24/14  4:25 AM  Result Value Ref Range   WBC 8.0 4.0 - 10.5 K/uL   RBC 4.11 3.87 - 5.11 MIL/uL   Hemoglobin 11.1 (L) 12.0 - 15.0 g/dL   HCT 32.9 (L) 36.0 - 46.0 %   MCV 80.0 78.0 - 100.0 fL   MCH 27.0 26.0 - 34.0 pg   MCHC 33.7 30.0 - 36.0 g/dL   RDW 14.4 11.5 - 15.5 %   Platelets 131 (L) 150 - 400 K/uL   Neutrophils Relative % 92 (H) 43 - 77 %   Neutro Abs 7.4 1.7 - 7.7 K/uL   Lymphocytes Relative 5 (L) 12 - 46 %   Lymphs Abs 0.4 (L) 0.7 - 4.0 K/uL   Monocytes Relative 3 3 - 12 %   Monocytes Absolute 0.2 0.1 - 1.0 K/uL   Eosinophils Relative 0 0 - 5 %   Eosinophils Absolute 0.0 0.0 - 0.7 K/uL   Basophils Relative 0 0 - 1 %   Basophils Absolute 0.0 0.0 - 0.1 K/uL  Comprehensive metabolic panel     Status: Abnormal   Collection Time: 09/24/14  4:25 AM  Result Value Ref Range   Sodium 129 (L) 135 - 145 mmol/L   Potassium 3.6 3.5 - 5.1 mmol/L   Chloride 101 101 - 111 mmol/L   CO2 17 (L) 22 - 32 mmol/L   Glucose, Bld 221 (H) 65 - 99 mg/dL   BUN 22 (H) 6 - 20 mg/dL   Creatinine, Ser 1.40 (H) 0.44 - 1.00 mg/dL   Calcium 7.9 (L) 8.9 - 10.3 mg/dL   Total Protein 5.7 (L) 6.5 - 8.1 g/dL   Albumin 2.7 (L) 3.5 - 5.0 g/dL   AST 27 15 - 41 U/L   ALT 22 14 - 54 U/L   Alkaline Phosphatase 112 38 - 126 U/L   Total Bilirubin 0.9 0.3 - 1.2 mg/dL   GFR calc non Af Amer 35 (L) >60 mL/min   GFR calc Af Amer 41 (L) >60 mL/min    Comment: (NOTE) The  eGFR has been calculated using the CKD EPI equation. This  calculation has not been validated in all clinical situations. eGFR's persistently <60 mL/min signify possible Chronic Kidney Disease.    Anion gap 11 5 - 15  Lipase, blood     Status: Abnormal   Collection Time: 09/24/14  4:25 AM  Result Value Ref Range   Lipase 20 (L) 22 - 51 U/L  Blood culture (routine x 2)     Status: None (Preliminary result)   Collection Time: 09/24/14  7:35 AM  Result Value Ref Range   Specimen Description BLOOD RIGHT ANTECUBITAL    Special Requests BOTTLES DRAWN AEROBIC AND ANAEROBIC 10CC    Culture PENDING    Report Status PENDING   Blood culture (routine x 2)     Status: None (Preliminary result)   Collection Time: 09/24/14  7:45 AM  Result Value Ref Range   Specimen Description BLOOD RIGHT HAND    Special Requests BOTTLES DRAWN AEROBIC AND ANAEROBIC 10CC    Culture PENDING    Report Status PENDING   Lactic acid, plasma     Status: None   Collection Time: 09/24/14  7:45 AM  Result Value Ref Range   Lactic Acid, Venous 1.4 0.5 - 2.0 mmol/L  Procalcitonin     Status: None   Collection Time: 09/24/14  7:45 AM  Result Value Ref Range   Procalcitonin 18.59 ng/mL    Comment:        Interpretation: PCT >= 10 ng/mL: Important systemic inflammatory response, almost exclusively due to severe bacterial sepsis or septic shock. (NOTE)         ICU PCT Algorithm               Non ICU PCT Algorithm    ----------------------------     ------------------------------         PCT < 0.25 ng/mL                 PCT < 0.1 ng/mL     Stopping of antibiotics            Stopping of antibiotics       strongly encouraged.               strongly encouraged.    ----------------------------     ------------------------------       PCT level decrease by               PCT < 0.25 ng/mL       >= 80% from peak PCT       OR PCT 0.25 - 0.5 ng/mL          Stopping of antibiotics                                              encouraged.     Stopping of antibiotics           encouraged.    ----------------------------     ------------------------------       PCT level decrease by              PCT >= 0.25 ng/mL       < 80% from peak PCT        AND PCT >= 0.5 ng/mL             Continuing antibiotics  encouraged.       Continuing antibiotics            encouraged.    ----------------------------     ------------------------------     PCT level increase compared          PCT > 0.5 ng/mL         with peak PCT AND          PCT >= 0.5 ng/mL             Escalation of antibiotics                                          strongly encouraged.      Escalation of antibiotics        strongly encouraged.   Protime-INR     Status: Abnormal   Collection Time: 09/24/14  7:45 AM  Result Value Ref Range   Prothrombin Time 18.8 (H) 11.6 - 15.2 seconds   INR 1.57 (H) 0.00 - 1.49  APTT     Status: None   Collection Time: 09/24/14  7:45 AM  Result Value Ref Range   aPTT 33 24 - 37 seconds  I-Stat CG4 Lactic Acid, ED     Status: None   Collection Time: 09/24/14  7:58 AM  Result Value Ref Range   Lactic Acid, Venous 1.29 0.5 - 2.0 mmol/L   Recent Results (from the past 240 hour(s))  Blood culture (routine x 2)     Status: None (Preliminary result)   Collection Time: 09/24/14  7:35 AM  Result Value Ref Range Status   Specimen Description BLOOD RIGHT ANTECUBITAL  Final   Special Requests BOTTLES DRAWN AEROBIC AND ANAEROBIC 10CC  Final   Culture PENDING  Incomplete   Report Status PENDING  Incomplete  Blood culture (routine x 2)     Status: None (Preliminary result)   Collection Time: 09/24/14  7:45 AM  Result Value Ref Range Status   Specimen Description BLOOD RIGHT HAND  Final   Special Requests BOTTLES DRAWN AEROBIC AND ANAEROBIC 10CC  Final   Culture PENDING  Incomplete   Report Status PENDING  Incomplete   Creatinine:  Recent Labs  09/24/14 0425  CREATININE 1.40*     Xrays: EXAM: CT ABDOMEN AND PELVIS WITHOUT CONTRAST  TECHNIQUE: Multidetector CT imaging of the abdomen and pelvis was performed following the standard protocol without IV contrast.  COMPARISON: 12/22/2013 and prior CTs. 06/03/2014 chest radiograph  FINDINGS: Please note that parenchymal abnormalities may be missed without intravenous contrast.  Right hip replacement metallic artifact obscures detail within the pelvis.  Lower chest: Mild basilar atelectasis noted. Cardiomegaly is again identified.  Hepatobiliary: Slightly nodular hepatic contour and hepatic configuration may represents cirrhosis. Correlate clinically. No focal hepatic lesions are identified. Equivocal mild pericholecystic stranding/ inflammation noted. There is no evidence of biliary dilatation.  Pancreas: Unremarkable  Spleen: Unremarkable  Adrenals/Urinary Tract: New bilateral perinephric stranding/ inflammation noted which is nonspecific but infection is not excluded. A focus of gas within the right kidney (image 33) is suspicious for early emphysematous pyelitis.  Moderate -severe right renal atrophy and right renal calculi are again identified. It would  There is no evidence of hydronephrosis or obstructing urinary calculi.  Stomach/Bowel: Descending and sigmoid colonic diverticulosis noted without diverticulitis. There is no evidence of bowel obstruction  Vascular/Lymphatic: No enlarged lymph nodes or abdominal aortic aneurysm.  Reproductive: Unchanged with probable uterine fibroid.  Other: No free fluid, pneumoperitoneum or definite abscess.  Musculoskeletal: No acute abnormalities are noted. Right total hip replacement, T8 compression fracture, degenerative changes within the lumbar spine and remote right inferior pubic ramus fracture again identified.  IMPRESSION: New bilateral perinephric stranding/inflammation with focus of gas within the right intrarenal  collecting system. This is suspicious for infection, especially on the right which likely represents early emphysematous pyelitis.  Equivocal mild pericholecystic stranding/inflammation. Consider ultrasound or nuclear medicine study if there is strong clinical suspicion for acute cholecystitis.  Possible cirrhosis. Correlate clinically.  Cardiomegaly.   Electronically Signed  By: Margarette Canada M.D.  Impression/Assessment:    80 yo Joyce Gilmore with  emphysematous pyelonephritis, bilateral non-obstructing ( incidental ) nephrolithiasis,  IDDM, Stage IIIB CKD, hx MRSA. She is treated with   Rocephin currently. May need ID consult for consideration of best renal coverage and total length of time for antibiotics. No indication/advantage for Urologic intervention at this point. She is sallow-appearing, with large abdomen, c/w cirrhosis and fluid.   Guarded prognosis.  Plan:   Will follow with you.   Jalin Erpelding I Keyle Doby 09/24/2014, 12:07 PM

## 2014-09-24 NOTE — Progress Notes (Addendum)
ANTIBIOTIC CONSULT NOTE - INITIAL  Pharmacy Consult:  Cubicin / Zosyn Indication:  Sepsis from emphysematous pyelitis  Allergies  Allergen Reactions  . Bactrim [Sulfamethoxazole-Trimethoprim]     Hyperkalemia  . Rifampin Other (See Comments)    Severe thrombocytopenia  . Vancomycin Other (See Comments)    Severe thrombocytopenia    Patient Measurements: Height = 56 inches Weight = 72.3 kg  Vital Signs: Temp: 98.5 F (36.9 C) (07/31 0717) Temp Source: Oral (07/31 0717) BP: 91/46 mmHg (07/31 0730) Pulse Rate: 78 (07/31 0730) Intake/Output from previous day: 07/30 0701 - 07/31 0700 In: 1000 [I.V.:1000] Out: -  Intake/Output from this shift: Total I/O In: 1000 [IV Piggyback:1000] Out: -   Labs:  Recent Labs  09/24/14 0425  WBC 8.0  HGB 11.1*  PLT 131*  CREATININE 1.40*   CrCl cannot be calculated (Unknown ideal weight.). No results for input(s): VANCOTROUGH, VANCOPEAK, VANCORANDOM, GENTTROUGH, GENTPEAK, GENTRANDOM, TOBRATROUGH, TOBRAPEAK, TOBRARND, AMIKACINPEAK, AMIKACINTROU, AMIKACIN in the last 72 hours.   Microbiology: Recent Results (from the past 720 hour(s))  Blood culture (routine x 2)     Status: None (Preliminary result)   Collection Time: 09/24/14  7:35 AM  Result Value Ref Range Status   Specimen Description BLOOD RIGHT ANTECUBITAL  Final   Special Requests BOTTLES DRAWN AEROBIC AND ANAEROBIC 10CC  Final   Culture PENDING  Incomplete   Report Status PENDING  Incomplete  Blood culture (routine x 2)     Status: None (Preliminary result)   Collection Time: 09/24/14  7:45 AM  Result Value Ref Range Status   Specimen Description BLOOD RIGHT HAND  Final   Special Requests BOTTLES DRAWN AEROBIC AND ANAEROBIC 10CC  Final   Culture PENDING  Incomplete   Report Status PENDING  Incomplete    Medical History: Past Medical History  Diagnosis Date  . Hypertension   . Depression     hx of   . Diabetes mellitus without complication   . Chronic kidney  disease     hx of kidney stones,   . Arthritis   . Hearing loss of both ears   . Tinnitus of both ears   . MRSA infection     Oct 13 - Nov 13  . Ganglion, left ankle and foot   . Osteoporosis       Assessment: 26 YOF presented with emesis, diarrhea and fever.  CT showed possible early emphysematous pyelitis with gas within the right intrarenal collecting system along with acute cholecystitis.  Pharmacy consulted to initiate Cubicin and Zosyn, given history of MRSA infection and allergy to vancomycin.  Noted patient has AKI; LFTs WNL.  Cubicin 7/31 >> Zosyn 7/31 >>  7/31 UCx - 7/31 BCx x2 -   Goal of Therapy:  Resolution of infection   Plan:  - Cubicin 430mg  IV Q48H (~6 mg/kg given severity of infection) - Zosyn 3.375gm IV Q8H, 4 hr infusion - Monitor renal fxn, clinical progress - CK in AM and weekly CK while on Cubicin - Hold Pravachol per discussion with NP    Thuy D. Mina Marble, PharmD, BCPS Pager:  [phone] 09/24/2014, 9:42 AM    Addendum: De-escalating to ceftriaxone.   Plan: Ceftriaxone 1gm IV Q24H  *Pharmacy will sign-off as no dose adjustments are anticipated. Thank you for the consult!  Salome Arnt, PharmD, BCPS Pager # [phone] 09/24/2014 3:13 PM

## 2014-09-24 NOTE — Progress Notes (Signed)
Patient transferred from ER via stretcher on tele with daughter at bedside. Patient speaks only [place]. Used translation line to orient patient to unit and room, instruct on callbell and placed at side, bed alarm on, all questions answered.

## 2014-09-25 ENCOUNTER — Inpatient Hospital Stay (HOSPITAL_COMMUNITY): Payer: Medicaid Other

## 2014-09-25 DIAGNOSIS — T8459XA Infection and inflammatory reaction due to other internal joint prosthesis, initial encounter: Secondary | ICD-10-CM | POA: Diagnosis present

## 2014-09-25 DIAGNOSIS — N390 Urinary tract infection, site not specified: Secondary | ICD-10-CM

## 2014-09-25 DIAGNOSIS — N179 Acute kidney failure, unspecified: Secondary | ICD-10-CM

## 2014-09-25 DIAGNOSIS — E872 Acidosis: Secondary | ICD-10-CM

## 2014-09-25 DIAGNOSIS — A4151 Sepsis due to Escherichia coli [E. coli]: Principal | ICD-10-CM

## 2014-09-25 DIAGNOSIS — B9620 Unspecified Escherichia coli [E. coli] as the cause of diseases classified elsewhere: Secondary | ICD-10-CM

## 2014-09-25 DIAGNOSIS — R0600 Dyspnea, unspecified: Secondary | ICD-10-CM

## 2014-09-25 DIAGNOSIS — Z96649 Presence of unspecified artificial hip joint: Secondary | ICD-10-CM | POA: Diagnosis present

## 2014-09-25 LAB — BASIC METABOLIC PANEL
ANION GAP: 6 (ref 5–15)
BUN: 17 mg/dL (ref 6–20)
CALCIUM: 7.9 mg/dL — AB (ref 8.9–10.3)
CHLORIDE: 117 mmol/L — AB (ref 101–111)
CO2: 13 mmol/L — ABNORMAL LOW (ref 22–32)
Creatinine, Ser: 1.2 mg/dL — ABNORMAL HIGH (ref 0.44–1.00)
GFR calc Af Amer: 49 mL/min — ABNORMAL LOW (ref >60)
GFR calc non Af Amer: 42 mL/min — ABNORMAL LOW (ref >60)
Glucose, Bld: 154 mg/dL — ABNORMAL HIGH (ref 65–99)
Potassium: 4.1 mmol/L (ref 3.5–5.1)
Sodium: 136 mmol/L (ref 135–145)

## 2014-09-25 LAB — HEMOGLOBIN A1C
HEMOGLOBIN A1C: 8.1 % — AB (ref 4.8–5.6)
Mean Plasma Glucose: 186 mg/dL

## 2014-09-25 LAB — CBC
HCT: 31.7 % — ABNORMAL LOW (ref 36.0–46.0)
Hemoglobin: 10.6 g/dL — ABNORMAL LOW (ref 12.0–15.0)
MCH: 26.7 pg (ref 26.0–34.0)
MCHC: 33.4 g/dL (ref 30.0–36.0)
MCV: 79.8 fL (ref 78.0–100.0)
Platelets: 101 10*3/uL — ABNORMAL LOW (ref 150–400)
RBC: 3.97 MIL/uL (ref 3.87–5.11)
RDW: 15 % (ref 11.5–15.5)
WBC: 14.8 10*3/uL — ABNORMAL HIGH (ref 4.0–10.5)

## 2014-09-25 LAB — HEPATITIS PANEL, ACUTE
HCV Ab: 0.1 s/co ratio (ref 0.0–0.9)
Hep A IgM: NEGATIVE
Hep B C IgM: NEGATIVE
Hepatitis B Surface Ag: NEGATIVE

## 2014-09-25 LAB — GLUCOSE, CAPILLARY
Glucose-Capillary: 157 mg/dL — ABNORMAL HIGH (ref 65–99)
Glucose-Capillary: 141 mg/dL — ABNORMAL HIGH (ref 65–99)
Glucose-Capillary: 189 mg/dL — ABNORMAL HIGH (ref 65–99)
Glucose-Capillary: 149 mg/dL — ABNORMAL HIGH (ref 65–99)

## 2014-09-25 LAB — CK: Total CK: 150 U/L (ref 38–234)

## 2014-09-25 MED ORDER — SODIUM CHLORIDE 0.9 % IV SOLN
INTRAVENOUS | Status: DC
  Administered 2014-09-25 – 2014-09-27 (×3): via INTRAVENOUS

## 2014-09-25 MED ORDER — ACETAMINOPHEN 160 MG/5ML PO SUSP
500.0000 mg | Freq: Four times a day (QID) | ORAL | Status: DC | PRN
  Administered 2014-09-26: 500 mg via ORAL

## 2014-09-25 NOTE — Evaluation (Signed)
Clinical/Bedside Swallow Evaluation Patient Details  Name: Joyce Gilmore MRN: [id number] Date of Birth: 07/22/1934  Today's Date: 09/25/2014 Time: SLP Start Time (ACUTE ONLY): 0825 SLP Stop Time (ACUTE ONLY): 0835 SLP Time Calculation (min) (ACUTE ONLY): 10 min  Past Medical History:  Past Medical History  Diagnosis Date  . Hypertension   . Depression     hx of   . Diabetes mellitus without complication   . Chronic kidney disease     hx of kidney stones,   . Arthritis   . Hearing loss of both ears   . Tinnitus of both ears   . MRSA infection     Oct 13 - Nov 13  . Ganglion, left ankle and foot   . Osteoporosis    Past Surgical History:  Past Surgical History  Procedure Laterality Date  . Total hip revision  12/05/2011    Procedure: TOTAL HIP REVISION;  Surgeon: Mcarthur Rossetti, MD;  Location: [facility];  Service: Orthopedics;  Laterality: Right;  Right Hip Revision Arthroplasty to Total Hip, Excision of Old Implant  . Incision and drainage hip  12/22/2011    Procedure: IRRIGATION AND DEBRIDEMENT HIP;  Surgeon: Mcarthur Rossetti, MD;  Location: [place];  Service: Orthopedics;  Laterality: Right;  Irrigation and debridement right hip  . Incision and drainage hip  12/27/2011    Procedure: IRRIGATION AND DEBRIDEMENT HIP;  Surgeon: Mcarthur Rossetti, MD;  Location: [place];  Service: Orthopedics;  Laterality: Right;  Repeat irrigation and debridement  . Joint replacement      bilateral knee, right hip   . Back surgery      history restored after error with record merge       HPI:  Joyce Gilmore is a 80 y.o. female who speaks only Spanish, with diabetes, chronic kidney disease. She presented to the emergency department with vomiting, diarrhea, abdominal pain and elevated glucose readings after a recent 3 month trip to [place].  She is only able to keep down liquids. MD suspects sepsis is secondary to pyelitis infection.    Assessment / Plan / Recommendation Clinical  Impression  Pt demonstrates normal swallow function. No evidence of aspiration seen. Swallow and ability to masticate are normal. SLP communicated with pt in Spanish; she denies any history of difficulty swallowing. Will not upgrade diet as liquid diet appears necessary due to pts infection. No SLP f/u needed, MD to upgrade diet to regular/thin when medically ready.     Aspiration Risk  Mild    Diet Recommendation Age appropriate regular solids;Thin   Medication Administration: Whole meds with liquid    Other  Recommendations     Follow Up Recommendations       Frequency and Duration        Pertinent Vitals/Pain NA    SLP Swallow Goals     Swallow Study Prior Functional Status       General Other Pertinent Information: Joyce Gilmore is a 80 y.o. female who speaks only Spanish, with diabetes, chronic kidney disease. She presented to the emergency department with vomiting, diarrhea, abdominal pain and elevated glucose readings after a recent 3 month trip to [place].  She is only able to keep down liquids. MD suspects sepsis is secondary to pyelitis infection.  Type of Study: Bedside swallow evaluation Diet Prior to this Study: Thin liquids Temperature Spikes Noted: No Respiratory Status: Room air History of Recent Intubation: No Behavior/Cognition: Alert;Cooperative;Pleasant mood Oral Cavity - Dentition: Adequate natural dentition/normal for age  Self-Feeding Abilities: Able to feed self Patient Positioning:  (edge of bed) Baseline Vocal Quality: Normal Volitional Cough: Strong Volitional Swallow: Able to elicit    Oral/Motor/Sensory Function Overall Oral Motor/Sensory Function: Appears within functional limits for tasks assessed   Ice Chips     Thin Liquid Thin Liquid: Within functional limits    Nectar Thick     Honey Thick     Puree Puree: Within functional limits   Solid   GO           Herbie [place], MA CCC-SLP [phone]  Lynann Beaver 09/25/2014,9:43 AM

## 2014-09-25 NOTE — Evaluation (Signed)
Physical Therapy Evaluation Patient Details Name: Joyce Gilmore MRN: [id number] DOB: [birth year] Today's Date: 09/25/2014   History of Present Illness  Adm 09/24/14 with urosepsis PMHx- bil TKR, Rt THR with revision x 2; DM; renal failure; hepatic cirrhosis; bil HOH; osteopenia  Clinical Impression  Patient evaluated by Physical Therapy with no further acute PT needs identified.Educated to ambulate with nursing assistance (due to lines/monitoring) and the patient has no further questions.  PT is signing off. Thank you for this referral.     Follow Up Recommendations No PT follow up    Equipment Recommendations  None recommended by PT    Recommendations for Other Services       Precautions / Restrictions Precautions Precautions: None Restrictions Weight Bearing Restrictions: No      Mobility  Bed Mobility Overal bed mobility: Needs Assistance Bed Mobility: Supine to Sit     Supine to sit: Min assist     General bed mobility comments: RN assisted to EOB as taking orthostatic BPs  Transfers Overall transfer level: Needs assistance Equipment used: 1 person hand held assist Transfers: Sit to/from Stand;Stand Pivot Transfers Sit to Stand: Min guard;Supervision Stand pivot transfers: Min guard;Supervision       General transfer comment: denied dizziness; HHA to simulate use of cane  Ambulation/Gait Ambulation/Gait assistance: Min assist Ambulation Distance (Feet): 300 Feet Assistive device: 1 person hand held assist Gait Pattern/deviations: Step-through pattern;Decreased stride length Gait velocity: decr due to dyspnea Gait velocity interpretation: Below normal speed for age/gender General Gait Details: HHA to simulate use of cane  Stairs            Wheelchair Mobility    Modified Rankin (Stroke Patients Only)       Balance Overall balance assessment: No apparent balance deficits (not formally assessed) (daughter denies balance issues PTA)                                           Pertinent Vitals/Pain See vitals flow sheet. SBP dropping as DBP rising to maintain MAP 79-89 (MAP rose with incr activity despite fluctuating SBP)  Pain Assessment: No/denies pain    Home Living Family/patient expects to be discharged to:: Private residence Living Arrangements: Children Available Help at Discharge: Family;Available PRN/intermittently           Home Equipment: [name] - single point Additional Comments: daughter denies other DME    Prior Function Level of Independence: Independent with assistive device(s)         Comments: with cane     Hand Dominance        Extremity/Trunk Assessment   Upper Extremity Assessment: Overall WFL for tasks assessed           Lower Extremity Assessment: Overall WFL for tasks assessed      Cervical / Trunk Assessment: Normal  Communication   Communication: Prefers language other than English;HOH ([place]; daughter interprets and PT speaking Spanish)  Cognition Arousal/Alertness: Awake/alert Behavior During Therapy: WFL for tasks assessed/performed Overall Cognitive Status: Within Functional Limits for tasks assessed                      General Comments      Exercises        Assessment/Plan    PT Assessment Patent does not need any further PT services  PT Diagnosis Difficulty walking   PT Problem List  PT Treatment Interventions     PT Goals (Current goals can be found in the Care Plan section) Acute Rehab PT Goals PT Goal Formulation: All assessment and education complete, DC therapy    Frequency     Barriers to discharge        Co-evaluation               End of Session   Activity Tolerance: Patient tolerated treatment well Patient left: in chair;with call bell/phone within reach;with family/visitor present Nurse Communication: Mobility status (d/c from PT)         Time: 9326-7124 PT Time Calculation (min) (ACUTE ONLY):  17 min   Charges:   PT Evaluation $Initial PT Evaluation Tier I: 1 Procedure     PT G Codes:        Christelle Igoe Oct 04, 2014, 11:40 AM Pager [phone]

## 2014-09-25 NOTE — Progress Notes (Signed)
Echocardiogram 2D Echocardiogram has been performed.  Joyce Gilmore 09/25/2014, 9:42 AM

## 2014-09-25 NOTE — Progress Notes (Signed)
[facility] TEAM 1 - Stepdown/ICU TEAM Progress Note  Joyce Gilmore:[id number] DOB: [birth year] DOA: 09/24/2014 PCP: Vic Blackbird, MD  Admit HPI / Brief Narrative: 80 yo female, spanish speaking, who recently traveled to [place].  Upon her return she felt weak and tired with nausea and nonbloody vomiting over 48 hours with subjective fevers and chills. Son explained that patient had complained about epigastric pain, constant, per patient 10/10 in severity, occasionally but not consistently radiating to the entire abdomen, worse with eating and no specific alleviating factors. Son reported elevated CBGs greater than 300.   Initial work up in the ED was notable for pyelonephritis with concern foremphysematous pyelitis.ID and Urology consulted.   HPI/Subjective: The patient is feeling much better today as reported by her son serving as a Optometrist.  She is anxious to go home.  She is very hungry and requests a regular diet.  She denies chest pain shortness breath fevers or chills.  Her crampy abdominal pain persist but has improved.  Assessment/Plan:  Severe Sepsis due to Escherichia coli emphysematous pyelonephritis Continue empiric antibiotic therapy and follow up culture data  Acute kidney injury renal function improving with volume resuscitation - follow trend  DM2 - uncontrolled  CBG currently well-controlled - follow trend  Hepatic cirrhosis Suggested on CT abdom - acute viral hepatitis panel unrevealing - ultrasound suggests cirrhotic architecture of liver - son denies use of alcohol or previous history of liver dysfunction/cirrhosis - LFTs are normal though protein is low - recheck coags in a.m.  HLD  Hold treatment while investigating possibility of cirrhosis  Code Status: FULL Family Communication: Spoke with son at bedside Disposition Plan: SDU  Consultants: Urology ID  Procedures: None  Antibiotics: Ceftriaxone 7/30 >  DVT prophylaxis: SQ heparin    Objective: Blood pressure 99/86, pulse 78, temperature 98.1 F (36.7 C), temperature source Oral, resp. rate 24, height 4\' 8"  (1.422 m), weight 72.3 kg (159 lb 6.3 oz), SpO2 100 %.  Intake/Output Summary (Last 24 hours) at 09/25/14 1535 Last data filed at 09/25/14 1120  Gross per 24 hour  Intake    605 ml  Output   1575 ml  Net   -970 ml   Exam: General: No acute respiratory distress Lungs: Clear to auscultation bilaterally without wheezes or crackles Cardiovascular: Regular rate and rhythm without murmur gallop or rub normal S1 and S2 Abdomen: mildly tender diffusely to deep palpation, nondistended, soft, bowel sounds positive, no rebound, no ascites, no appreciable mass Extremities: No significant cyanosis, clubbing, or edema bilateral lower extremities  Data Reviewed: Basic Metabolic Panel:  Recent Labs Lab 09/24/14 0425 09/25/14 0459  NA 129* 136  K 3.6 4.1  CL 101 117*  CO2 17* 13*  GLUCOSE 221* 154*  BUN 22* 17  CREATININE 1.40* 1.20*  CALCIUM 7.9* 7.9*    CBC:  Recent Labs Lab 09/24/14 0425 09/25/14 0459  WBC 8.0 14.8*  NEUTROABS 7.4  --   HGB 11.1* 10.6*  HCT 32.9* 31.7*  MCV 80.0 79.8  PLT 131* 101*    Liver Function Tests:  Recent Labs Lab 09/24/14 0425  AST 27  ALT 22  ALKPHOS 112  BILITOT 0.9  PROT 5.7*  ALBUMIN 2.7*    Recent Labs Lab 09/24/14 0425  LIPASE 20*    Coags:  Recent Labs Lab 09/24/14 0745  INR 1.57*    Recent Labs Lab 09/24/14 0745  APTT 33    Cardiac Enzymes:  Recent Labs Lab 09/25/14 0459  CKTOTAL 150  CBG:  Recent Labs Lab 09/24/14 1122 09/24/14 1751 09/24/14 2136 09/25/14 0804 09/25/14 1239  GLUCAP 153* 137* 198* 157* 141*    Recent Results (from the past 240 hour(s))  Urine culture     Status: None (Preliminary result)   Collection Time: 09/24/14  4:22 AM  Result Value Ref Range Status   Specimen Description URINE, CLEAN CATCH  Final   Special Requests NONE  Final   Culture  >=100,000 COLONIES/mL ESCHERICHIA COLI  Final   Report Status PENDING  Incomplete  Blood culture (routine x 2)     Status: None (Preliminary result)   Collection Time: 09/24/14  7:35 AM  Result Value Ref Range Status   Specimen Description BLOOD RIGHT ANTECUBITAL  Final   Special Requests BOTTLES DRAWN AEROBIC AND ANAEROBIC 10CC  Final   Culture NO GROWTH 1 DAY  Final   Report Status PENDING  Incomplete  Blood culture (routine x 2)     Status: None (Preliminary result)   Collection Time: 09/24/14  7:45 AM  Result Value Ref Range Status   Specimen Description BLOOD RIGHT HAND  Final   Special Requests BOTTLES DRAWN AEROBIC AND ANAEROBIC 10CC  Final   Culture NO GROWTH 1 DAY  Final   Report Status PENDING  Incomplete  MRSA PCR Screening     Status: None   Collection Time: 09/24/14  8:56 AM  Result Value Ref Range Status   MRSA by PCR NEGATIVE NEGATIVE Final    Comment:        The GeneXpert MRSA Assay (FDA approved for NASAL specimens only), is one component of a comprehensive MRSA colonization surveillance program. It is not intended to diagnose MRSA infection nor to guide or monitor treatment for MRSA infections.   Clostridium Difficile by PCR (not at [facility])     Status: None   Collection Time: 09/24/14  1:59 PM  Result Value Ref Range Status   C difficile by pcr NEGATIVE NEGATIVE Final     Studies:   Recent x-ray studies have been reviewed in detail by the Attending Physician  Scheduled Meds:  Scheduled Meds: . antiseptic oral rinse  7 mL Mouth Rinse q12n4p  . cefTRIAXone (ROCEPHIN)  IV  1 g Intravenous Q24H  . chlorhexidine  15 mL Mouth Rinse BID  . dextromethorphan  30 mg Oral BID  . guaiFENesin  1,200 mg Oral BID  . heparin  5,000 Units Subcutaneous 3 times per day  . insulin aspart  0-15 Units Subcutaneous TID WC  . insulin aspart  3 Units Subcutaneous TID WC  . insulin glargine  36 Units Subcutaneous QHS  . sodium chloride  3 mL Intravenous Q12H    Time  spent on care of this patient: 35 mins   Khloe Hunkele T , MD   [facility] Office  [phone] Pager - Text Page per Shea Evans as per below:  On-Call/Text Page:      [email or web address]      password [email or web address]  If 7PM-7AM, please contact night-coverage [email or web address] Password [email or web address] 09/25/2014, 3:35 PM   LOS: 1 day

## 2014-09-25 NOTE — Progress Notes (Signed)
[facility] for Infectious Disease    Subjective: No new complaints   Antibiotics:  Anti-infectives    Start     Dose/Rate Route Frequency Ordered Stop   09/25/14 0600  cefTRIAXone (ROCEPHIN) 1 g in dextrose 5 % 50 mL IVPB - Premix     1 g 100 mL/hr over 30 Minutes Intravenous Every 24 hours 09/24/14 1512     09/24/14 1100  DAPTOmycin (CUBICIN) 430 mg in sodium chloride 0.9 % IVPB  Status:  Discontinued     430 mg 217.2 mL/hr over 30 Minutes Intravenous Every 48 hours 09/24/14 0946 09/24/14 1501   09/24/14 1100  piperacillin-tazobactam (ZOSYN) IVPB 3.375 g  Status:  Discontinued     3.375 g 12.5 mL/hr over 240 Minutes Intravenous Every 8 hours 09/24/14 0948 09/24/14 1510   09/24/14 0815  linezolid (ZYVOX) IVPB 600 mg  Status:  Discontinued     600 mg 300 mL/hr over 60 Minutes Intravenous  Once 09/24/14 0758 09/24/14 0923   09/24/14 0600  cefTRIAXone (ROCEPHIN) 1 g in dextrose 5 % 50 mL IVPB     1 g 100 mL/hr over 30 Minutes Intravenous  Once 09/24/14 0548 09/24/14 0865      Medications: Scheduled Meds: . antiseptic oral rinse  7 mL Mouth Rinse q12n4p  . cefTRIAXone (ROCEPHIN)  IV  1 g Intravenous Q24H  . chlorhexidine  15 mL Mouth Rinse BID  . dextromethorphan  30 mg Oral BID  . guaiFENesin  1,200 mg Oral BID  . heparin  5,000 Units Subcutaneous 3 times per day  . insulin aspart  0-15 Units Subcutaneous TID WC  . insulin aspart  3 Units Subcutaneous TID WC  . insulin glargine  36 Units Subcutaneous QHS  . sodium chloride  3 mL Intravenous Q12H   Continuous Infusions: . sodium chloride 75 mL/hr at 09/25/14 1809   PRN Meds:.acetaminophen, alum & mag hydroxide-simeth, ipratropium-albuterol, ondansetron **OR** ondansetron (ZOFRAN) IV    Objective: Weight change:   Intake/Output Summary (Last 24 hours) at 09/25/14 2054 Last data filed at 09/25/14 1120  Gross per 24 hour  Intake    350 ml  Output   1575 ml  Net  -1225 ml   Blood pressure 148/110,  pulse 94, temperature 98.2 F (36.8 C), temperature source Oral, resp. rate 24, height 4\' 8"  (1.422 m), weight 159 lb 6.3 oz (72.3 kg), SpO2 100 %. Temp:  [98.1 F (36.7 C)-98.4 F (36.9 C)] 98.2 F (36.8 C) (08/01 1600) Pulse Rate:  [72-94] 94 (08/01 1925) Resp:  [19-25] 24 (08/01 1925) BP: (96-148)/(45-110) 148/110 mmHg (08/01 1925) SpO2:  [98 %-100 %] 100 % (08/01 1925)  Physical Exam: General: Alert and awake, oriented x3, not in any acute distress. HEENT: anicteric sclera, pupils reactive to light and accommodation, EOMI CVS regular rate, normal r,  no murmur rubs or gallops Chest: clear to auscultation bilaterally, no wheezing, rales or rhonchi Abdomen: soft nontender, nondistended, normal bowel sounds, Extremities: no  clubbing or edema noted bilaterally Skin: clean surgical site Neuro: nonfocal  CBC: CBC Latest Ref Rng 09/25/2014 09/24/2014 06/05/2014  WBC 4.0 - 10.5 K/uL 14.8(H) 8.0 9.9  Hemoglobin 12.0 - 15.0 g/dL 10.6(L) 11.1(L) 11.8(L)  Hematocrit 36.0 - 46.0 % 31.7(L) 32.9(L) 36.0  Platelets 150 - 400 K/uL 101(L) 131(L) 209       BMET  Recent Labs  09/24/14 0425 09/25/14 0459  NA 129* 136  K 3.6 4.1  CL 101 117*  CO2 17* 13*  GLUCOSE 221* 154*  BUN 22* 17  CREATININE 1.40* 1.20*  CALCIUM 7.9* 7.9*     Liver Panel   Recent Labs  09/24/14 0425  PROT 5.7*  ALBUMIN 2.7*  AST 27  ALT 22  ALKPHOS 112  BILITOT 0.9       Sedimentation Rate No results for input(s): ESRSEDRATE in the last 72 hours. C-Reactive Protein No results for input(s): CRP in the last 72 hours.  Micro Results: Recent Results (from the past 720 hour(s))  Urine culture     Status: None (Preliminary result)   Collection Time: 09/24/14  4:22 AM  Result Value Ref Range Status   Specimen Description URINE, CLEAN CATCH  Final   Special Requests NONE  Final   Culture >=100,000 COLONIES/mL ESCHERICHIA COLI  Final   Report Status PENDING  Incomplete  Blood culture (routine x  2)     Status: None (Preliminary result)   Collection Time: 09/24/14  7:35 AM  Result Value Ref Range Status   Specimen Description BLOOD RIGHT ANTECUBITAL  Final   Special Requests BOTTLES DRAWN AEROBIC AND ANAEROBIC 10CC  Final   Culture NO GROWTH 1 DAY  Final   Report Status PENDING  Incomplete  Blood culture (routine x 2)     Status: None (Preliminary result)   Collection Time: 09/24/14  7:45 AM  Result Value Ref Range Status   Specimen Description BLOOD RIGHT HAND  Final   Special Requests BOTTLES DRAWN AEROBIC AND ANAEROBIC 10CC  Final   Culture NO GROWTH 1 DAY  Final   Report Status PENDING  Incomplete  MRSA PCR Screening     Status: None   Collection Time: 09/24/14  8:56 AM  Result Value Ref Range Status   MRSA by PCR NEGATIVE NEGATIVE Final    Comment:        The GeneXpert MRSA Assay (FDA approved for NASAL specimens only), is one component of a comprehensive MRSA colonization surveillance program. It is not intended to diagnose MRSA infection nor to guide or monitor treatment for MRSA infections.   Clostridium Difficile by PCR (not at [facility])     Status: None   Collection Time: 09/24/14  1:59 PM  Result Value Ref Range Status   C difficile by pcr NEGATIVE NEGATIVE Final    Studies/Results: Ct Abdomen Pelvis Wo Contrast  09/24/2014   CLINICAL DATA:  80 year old female with diffuse abdominal and pelvic pain, nausea, vomiting, diarrhea and fever for 5 days.  EXAM: CT ABDOMEN AND PELVIS WITHOUT CONTRAST  TECHNIQUE: Multidetector CT imaging of the abdomen and pelvis was performed following the standard protocol without IV contrast.  COMPARISON:  12/22/2013 and prior CTs.  06/03/2014 chest radiograph  FINDINGS: Please note that parenchymal abnormalities may be missed without intravenous contrast.  Right hip replacement metallic artifact obscures detail within the pelvis.  Lower chest: Mild basilar atelectasis noted. Cardiomegaly is again identified.  Hepatobiliary: Slightly  nodular hepatic contour and hepatic configuration may represents cirrhosis. Correlate clinically. No focal hepatic lesions are identified. Equivocal mild pericholecystic stranding/ inflammation noted. There is no evidence of biliary dilatation.  Pancreas: Unremarkable  Spleen: Unremarkable  Adrenals/Urinary Tract: New bilateral perinephric stranding/ inflammation noted which is nonspecific but infection is not excluded. A focus of gas within the right kidney (image 33) is suspicious for early emphysematous pyelitis.  Moderate -severe right renal atrophy and right renal calculi are again identified. It would  There is no evidence of hydronephrosis or obstructing urinary calculi.  Stomach/Bowel: Descending and sigmoid colonic diverticulosis noted without diverticulitis. There is no evidence of bowel obstruction  Vascular/Lymphatic: No enlarged lymph nodes or abdominal aortic aneurysm.  Reproductive: Unchanged with probable uterine fibroid.  Other: No free fluid, pneumoperitoneum or definite abscess.  Musculoskeletal: No acute abnormalities are noted. Right total hip replacement, T8 compression fracture, degenerative changes within the lumbar spine and remote right inferior pubic ramus fracture again identified.  IMPRESSION: New bilateral perinephric stranding/inflammation with focus of gas within the right intrarenal collecting system. This is suspicious for infection, especially on the right which likely represents early emphysematous pyelitis.  Equivocal mild pericholecystic stranding/inflammation. Consider ultrasound or nuclear medicine study if there is strong clinical suspicion for acute cholecystitis.  Possible cirrhosis.  Correlate clinically.  Cardiomegaly.   Electronically Signed   By: Margarette Canada M.D.   On: 09/24/2014 08:43   Dg Chest 2 View  09/24/2014   CLINICAL DATA:  Sepsis  EXAM: CHEST  2 VIEW  COMPARISON:  Radiograph 06/03/2014  FINDINGS: Stable cardiac silhouette. There is chronic central  peribronchial thickening. No overt pulmonary edema. Wedge compression fracture in the mid thoracic spine unchanged.  IMPRESSION: 1. Chronic peribronchial thickening not changed. 2. No acute findings.   Electronically Signed   By: Suzy Bouchard M.D.   On: 09/24/2014 08:19   US Abdomen Complete  09/24/2014   CLINICAL DATA:  Generalized abdominal pain, fever, and nausea and vomiting for 5 days. Pericholecystic stranding seen on recent CT.  EXAM: ULTRASOUND ABDOMEN COMPLETE  COMPARISON:  CT on 09/24/2014 and ultrasound on 12/14/2013  FINDINGS: Technically difficult exam as patient could not perform breath-holding.  Gallbladder: No gallstones or wall thickening visualized. No sonographic Murphy sign noted.  Common bile duct: Diameter: 3 mm, within normal limits.  Liver: No focal lesion identified. Mild capsular nodularity, consistent with hepatic cirrhosis better demonstrated on recent CT.  IVC: No abnormality visualized.  Pancreas: Visualized portion unremarkable.  Spleen: Size and appearance within normal limits.  Right Kidney: Length: 9.0 cm. Right renal parenchymal scarring and atrophy again noted. Mild right renal pelvicaliectasis, similar to prior exams.  Left Kidney: Length: 11.6 cm. Echogenicity within normal limits. No mass or hydronephrosis visualized.  Abdominal aorta: No aneurysm visualized.  Other findings: None.  IMPRESSION: Technically suboptimal exam as discussed above. No gallstones visualized. No evidence of biliary dilatation.  Hepatic cirrhosis.  No liver mass or ascites visualized.  Right renal parenchymal scarring and atrophy. Mild right renal pelvicaliectasis, without significant change compared to prior exams.   Electronically Signed   By: Earle Gell M.D.   On: 09/24/2014 14:05      Assessment/Plan:  Principal Problem:   Sepsis Active Problems:   Diabetes mellitus type II, controlled   UTI (lower urinary tract infection)   Acute renal failure   Emphysematous pyelitis   Hepatic  cirrhosis   Metabolic acidosis    Joyce Gilmore is a 80 y.o. female with prior prosthetic hip infection with MRSA but who has done well off antibiotics now with emphysematous pyelonephritis due to E coli  #1 E coli emphysematous pyelonephritis:  --continue rocephin --followup culture data --would treat for 10 days with effective agents  #2 Prior prosthetic hip infection: no evidence of active disease.    LOS: 1 day   Alcide Evener 09/25/2014, 8:54 PM

## 2014-09-26 DIAGNOSIS — A419 Sepsis, unspecified organism: Secondary | ICD-10-CM | POA: Diagnosis present

## 2014-09-26 DIAGNOSIS — N12 Tubulo-interstitial nephritis, not specified as acute or chronic: Secondary | ICD-10-CM

## 2014-09-26 DIAGNOSIS — E785 Hyperlipidemia, unspecified: Secondary | ICD-10-CM

## 2014-09-26 DIAGNOSIS — E1165 Type 2 diabetes mellitus with hyperglycemia: Secondary | ICD-10-CM

## 2014-09-26 DIAGNOSIS — N10 Acute tubulo-interstitial nephritis: Secondary | ICD-10-CM | POA: Diagnosis present

## 2014-09-26 DIAGNOSIS — R6520 Severe sepsis without septic shock: Secondary | ICD-10-CM

## 2014-09-26 DIAGNOSIS — K7469 Other cirrhosis of liver: Secondary | ICD-10-CM

## 2014-09-26 LAB — COMPREHENSIVE METABOLIC PANEL
ALT: 25 U/L (ref 14–54)
AST: 33 U/L (ref 15–41)
Albumin: 2.6 g/dL — ABNORMAL LOW (ref 3.5–5.0)
Alkaline Phosphatase: 87 U/L (ref 38–126)
Anion gap: 9 (ref 5–15)
BUN: 13 mg/dL (ref 6–20)
CALCIUM: 8.6 mg/dL — AB (ref 8.9–10.3)
CO2: 16 mmol/L — AB (ref 22–32)
CREATININE: 0.98 mg/dL (ref 0.44–1.00)
Chloride: 109 mmol/L (ref 101–111)
GFR calc non Af Amer: 54 mL/min — ABNORMAL LOW (ref >60)
Glucose, Bld: 164 mg/dL — ABNORMAL HIGH (ref 65–99)
Potassium: 3.8 mmol/L (ref 3.5–5.1)
Sodium: 134 mmol/L — ABNORMAL LOW (ref 135–145)
TOTAL PROTEIN: 6.4 g/dL — AB (ref 6.5–8.1)
Total Bilirubin: 0.5 mg/dL (ref 0.3–1.2)

## 2014-09-26 LAB — GLUCOSE, CAPILLARY
GLUCOSE-CAPILLARY: 82 mg/dL (ref 65–99)
Glucose-Capillary: 189 mg/dL — ABNORMAL HIGH (ref 65–99)
Glucose-Capillary: 152 mg/dL — ABNORMAL HIGH (ref 65–99)
Glucose-Capillary: 240 mg/dL — ABNORMAL HIGH (ref 65–99)

## 2014-09-26 LAB — CBC
HEMATOCRIT: 33.5 % — AB (ref 36.0–46.0)
Hemoglobin: 10.9 g/dL — ABNORMAL LOW (ref 12.0–15.0)
MCH: 26.3 pg (ref 26.0–34.0)
MCHC: 32.5 g/dL (ref 30.0–36.0)
MCV: 80.9 fL (ref 78.0–100.0)
Platelets: 144 10*3/uL — ABNORMAL LOW (ref 150–400)
RBC: 4.14 MIL/uL (ref 3.87–5.11)
RDW: 14.9 % (ref 11.5–15.5)
WBC: 14.2 10*3/uL — ABNORMAL HIGH (ref 4.0–10.5)

## 2014-09-26 LAB — URINE CULTURE: Culture: >=100000

## 2014-09-26 LAB — HIV ANTIBODY (ROUTINE TESTING W REFLEX): HIV Screen 4th Generation wRfx: NONREACTIVE

## 2014-09-26 LAB — PROTIME-INR
INR: 1.2 (ref 0.00–1.49)
Prothrombin Time: 15.3 seconds — ABNORMAL HIGH (ref 11.6–15.2)

## 2014-09-26 MED ORDER — GLUCERNA SHAKE PO LIQD
237.0000 mL | Freq: Two times a day (BID) | ORAL | Status: DC
  Administered 2014-09-27 – 2014-09-28 (×3): 237 mL via ORAL

## 2014-09-26 MED ORDER — METOPROLOL TARTRATE 12.5 MG HALF TABLET
12.5000 mg | ORAL_TABLET | Freq: Two times a day (BID) | ORAL | Status: DC
  Administered 2014-09-26 – 2014-09-27 (×2): 12.5 mg via ORAL
  Filled 2014-09-26 (×3): qty 1

## 2014-09-26 NOTE — Progress Notes (Signed)
         [facility] for Infectious Disease    Patient growing exquisitely s E coli  When ready to DC would sent out on levaquin 500mg  daily to complete total of 10 days of effective therapy including her ceftriaxone  I will sign off for now  Please call back with further questions.

## 2014-09-26 NOTE — Progress Notes (Signed)
[facility] TEAM 1 - Stepdown/ICU TEAM Progress Note  Joyce Gilmore TWS:[id number] DOB: 26-Mar-1934 DOA: 09/24/2014 PCP: Vic Blackbird, MD  Admit HPI / Brief Narrative: 80 yo HF(spanish speaking) PMHx Depression, HTN diabetes type 2, CKD, Nephrolithiasis, who recently traveled to [place]. Upon her return she felt weak and tired with nausea and nonbloody vomiting over 48 hours with subjective fevers and chills. Son explained that patient had complained about epigastric pain, constant, per patient 10/10 in severity, occasionally but not consistently radiating to the entire abdomen, worse with eating and no specific alleviating factors. Son reported elevated CBGs greater than 300.   Initial work up in the ED was notable for pyelonephritis with concern foremphysematous pyelitis.ID and Urology consulted.    HPI/Subjective: 8/2 A/O 4, NAD, negative CP, negative abdominal pain, negative N/V, negative SOB  Assessment/Plan: Severe Sepsis due to Escherichia coli emphysematous pyelonephritis -Continue ceftriaxone for a ten-day course of antibiotics treatment per ID   Acute kidney injury renal function improving with volume resuscitation - follow trend  Pulmonary hypertension/diastolic CHF/Essential Hypertension -Metoprolol 12.5 mg BID   DM Type2 - uncontrolled  CBG currently well-controlled - follow trend  Hepatic cirrhosis -Suggested on CT abdom - acute viral hepatitis panel unrevealing - ultrasound suggests cirrhotic architecture of liver - son denies use of alcohol or previous history of liver dysfunction/cirrhosis - LFTs are normal though protein is low  - Coags basically WNL   HLD  -Hold treatment while investigating possibility of cirrhosis   Code Status: FULL Family Communication: Daughter present at time of exam Disposition Plan: Resolution emphysematous polynephritis    Consultants: Dr.Sigmund Tannenbaum ( Urology) Hand ( ID)  Procedure/Significant  Events: 7/31 CT abdomen pelvis without contrast;-Bilateral perinephric stranding/inflammation with focus of gas within the right intrarenal collecting system. Suspicious for infection, especially on the right which likely represents early emphysematous pyelitis. -Possible cirrhosis. -Cardiomegaly. 7/31 ultrasound abdomen;-Hepatic cirrhosis. No liver mass or ascites visualized. 8/1 echocardiogram;- LVEF= 60% to 65%.-(grade 1 diastolic dysfunction). - Pulmonary arteries: PA peak pressure: 44 mm Hg (S).   Culture 7/31 urine positive Escherichia coli pansensitive 7/31 blood right AC/HAND NGTD 7/31 MRSA by PCR negative 7/31 C. difficile by PCR negative   Antibiotics: Ceftriaxone 7/30 >   DVT prophylaxis: Heparin subcutaneous   Devices   LINES / TUBES:      Continuous Infusions: . sodium chloride 75 mL/hr at 09/26/14 1329    Objective: VITAL SIGNS: Temp: 98.2 F (36.8 C) (08/02 1115) Temp Source: Oral (08/02 1115) BP: 159/61 mmHg (08/02 1545) Pulse Rate: 69 (08/02 1115) SPO2; FIO2:   Intake/Output Summary (Last 24 hours) at 09/26/14 1734 Last data filed at 09/26/14 1400  Gross per 24 hour  Intake   2045 ml  Output      0 ml  Net   2045 ml     Exam: General: A/O 4, NAD, No acute respiratory distress Eyes: Negative headache, eye pain, scleral hemorrhage ENT: Negative Runny nose, negative ear pain, negative tinnitus, negative gingival bleeding Neck:  Negative scars, masses, torticollis, lymphadenopathy, JVD Lungs: Clear to auscultation bilaterally without wheezes or crackles Cardiovascular: Regular rate and rhythm without murmur gallop or rub normal S1 and S2 Abdomen:negative abdominal pain, , negative CVA tenderness, negative dysphagia, Nontender, nondistended, soft, bowel sounds positive, no rebound, no ascites, no appreciable mass Extremities: No significant cyanosis, clubbing, or edema bilateral lower extremities Psychiatric:  Negative depression,  negative anxiety, negative fatigue, negative mania  Neurologic:  Cranial nerves II through XII intact,  tongue/uvula midline, all extremities muscle strength 5/5, sensation intact throughout, negative dysarthria, negative expressive aphasia, negative receptive aphasia.     Data Reviewed: Basic Metabolic Panel:  Recent Labs Lab 09/24/14 0425 09/25/14 0459 09/26/14 0220  NA 129* 136 134*  K 3.6 4.1 3.8  CL 101 117* 109  CO2 17* 13* 16*  GLUCOSE 221* 154* 164*  BUN 22* 17 13  CREATININE 1.40* 1.20* 0.98  CALCIUM 7.9* 7.9* 8.6*   Liver Function Tests:  Recent Labs Lab 09/24/14 0425 09/26/14 0220  AST 27 33  ALT 22 25  ALKPHOS 112 87  BILITOT 0.9 0.5  PROT 5.7* 6.4*  ALBUMIN 2.7* 2.6*    Recent Labs Lab 09/24/14 0425  LIPASE 20*   No results for input(s): AMMONIA in the last 168 hours. CBC:  Recent Labs Lab 09/24/14 0425 09/25/14 0459 09/26/14 0220  WBC 8.0 14.8* 14.2*  NEUTROABS 7.4  --   --   HGB 11.1* 10.6* 10.9*  HCT 32.9* 31.7* 33.5*  MCV 80.0 79.8 80.9  PLT 131* 101* 144*   Cardiac Enzymes:  Recent Labs Lab 09/25/14 0459  CKTOTAL 150   BNP (last 3 results) No results for input(s): BNP in the last 8760 hours.  ProBNP (last 3 results) No results for input(s): PROBNP in the last 8760 hours.  CBG:  Recent Labs Lab 09/25/14 1608 09/25/14 2116 09/26/14 0804 09/26/14 1214 09/26/14 1545  GLUCAP 189* 149* 189* 152* 240*    Recent Results (from the past 240 hour(s))  Urine culture     Status: None   Collection Time: 09/24/14  4:22 AM  Result Value Ref Range Status   Specimen Description URINE, CLEAN CATCH  Final   Special Requests NONE  Final   Culture >=100,000 COLONIES/mL ESCHERICHIA COLI  Final   Report Status 09/26/2014 FINAL  Final   Organism ID, Bacteria ESCHERICHIA COLI  Final      Susceptibility   Escherichia coli - MIC*    AMPICILLIN <=2 SENSITIVE Sensitive     CEFAZOLIN <=4 SENSITIVE Sensitive     CEFTRIAXONE <=1 SENSITIVE  Sensitive     CIPROFLOXACIN <=0.25 SENSITIVE Sensitive     GENTAMICIN <=1 SENSITIVE Sensitive     IMIPENEM <=0.25 SENSITIVE Sensitive     NITROFURANTOIN <=16 SENSITIVE Sensitive     TRIMETH/SULFA <=20 SENSITIVE Sensitive     AMPICILLIN/SULBACTAM <=2 SENSITIVE Sensitive     PIP/TAZO <=4 SENSITIVE Sensitive     * >=100,000 COLONIES/mL ESCHERICHIA COLI  Blood culture (routine x 2)     Status: None (Preliminary result)   Collection Time: 09/24/14  7:35 AM  Result Value Ref Range Status   Specimen Description BLOOD RIGHT ANTECUBITAL  Final   Special Requests BOTTLES DRAWN AEROBIC AND ANAEROBIC 10CC  Final   Culture NO GROWTH 2 DAYS  Final   Report Status PENDING  Incomplete  Blood culture (routine x 2)     Status: None (Preliminary result)   Collection Time: 09/24/14  7:45 AM  Result Value Ref Range Status   Specimen Description BLOOD RIGHT HAND  Final   Special Requests BOTTLES DRAWN AEROBIC AND ANAEROBIC 10CC  Final   Culture NO GROWTH 2 DAYS  Final   Report Status PENDING  Incomplete  MRSA PCR Screening     Status: None   Collection Time: 09/24/14  8:56 AM  Result Value Ref Range Status   MRSA by PCR NEGATIVE NEGATIVE Final    Comment:        The GeneXpert  MRSA Assay (FDA approved for NASAL specimens only), is one component of a comprehensive MRSA colonization surveillance program. It is not intended to diagnose MRSA infection nor to guide or monitor treatment for MRSA infections.   Clostridium Difficile by PCR (not at [facility])     Status: None   Collection Time: 09/24/14  1:59 PM  Result Value Ref Range Status   Toxigenic C Difficile by pcr NEGATIVE NEGATIVE Final     Studies:  Recent x-ray studies have been reviewed in detail by the Attending Physician  Scheduled Meds:  Scheduled Meds: . cefTRIAXone (ROCEPHIN)  IV  1 g Intravenous Q24H  . dextromethorphan  30 mg Oral BID  . [START ON 09/27/2014] feeding supplement (GLUCERNA SHAKE)  237 mL Oral BID BM  . guaiFENesin   1,200 mg Oral BID  . heparin  5,000 Units Subcutaneous 3 times per day  . insulin aspart  0-15 Units Subcutaneous TID WC  . insulin aspart  3 Units Subcutaneous TID WC  . insulin glargine  36 Units Subcutaneous QHS  . metoprolol tartrate  12.5 mg Oral BID  . sodium chloride  3 mL Intravenous Q12H    Time spent on care of this patient: 40 mins   Shamaria Kavan, Geraldo Docker , MD  [facility] Office  [phone] Pager - [phone]  On-Call/Text Page:      [email or web address]      password [email or web address]  If 7PM-7AM, please contact night-coverage [email or web address] Password [email or web address] 09/26/2014, 5:34 PM   LOS: 2 days   Care during the described time interval was provided by me .  I have reviewed this patient's available data, including medical history, events of note, physical examination, and all test results as part of my evaluation. I have personally reviewed and interpreted all radiology studies.   Dia Crawford, MD [phone] Pager

## 2014-09-26 NOTE — Progress Notes (Signed)
Initial Nutrition Assessment  DOCUMENTATION CODES:   Obesity unspecified  INTERVENTION:   Glucerna Shake po BID, each supplement provides 220 kcal and 10 grams of protein  NUTRITION DIAGNOSIS:   Increased nutrient needs related to acute illness as evidenced by estimated needs.  GOAL:   Patient will meet greater than or equal to 90% of their needs  MONITOR:   PO intake, Supplement acceptance, Labs, Weight trends, Skin, I & O's  REASON FOR ASSESSMENT:   Consult Wound healing  ASSESSMENT:   Pt is 80 yo female, spanish speaking, son at bedside able to assist with interpretation and declines use of another interpretor. Son splaying that patient has recently traveled to [place] and has returned on Monday. She has felt weak and tired since arriving to [facility], has had nausea and nonbloody vomiting over the past 48 hours with subjective fevers and chills. Son also explained that patient has complained about epigastric pain, constant, per patient 10/10 in severity, occasionally but not consistently radiating to the entire abdomen, worse with eating and no specific alleviating factors. Son reported elevated CBGs greater than 300. No reports of similar events in the past.  Pt admitted for Severe Sepsis due to Escherichia coli emphysematous pyelonephritis.   Per ID note, pt with hx of prior prosthetic hip infection with MRSA; no evidence of active disease.   Pt sleeping soundly at time of visit. Unable to perform nutrition-focused physical exam at this time, as pt covered with multiple blankets.   Reviewed wt hx, which revealed wt stability.   Reviewed SLP note, which revealed swallow function WDL. Pt is on a carb modified diet; PO: 50-100%.  Labs reviewed: Na: 134.   Diet Order:  Diet Carb Modified Fluid consistency:: Thin; Room service appropriate?: Yes  Skin:  Reviewed, no issues  Last BM:  09/24/14  Height:   Ht Readings from Last 1 Encounters:  09/24/14 4\' 8"  (1.422 m)     Weight:   Wt Readings from Last 1 Encounters:  09/26/14 161 lb 2.5 oz (73.1 kg)    Ideal Body Weight:  42.4 kg  BMI:  Body mass index is 36.15 kg/(m^2).  Estimated Nutritional Needs:   Kcal:  1600-1800  Protein:  80-90 grams  Fluid:  1.6-1.8 L  EDUCATION NEEDS:   No education needs identified at this time  Shaliah Wann A. Jimmye Norman, RD, LDN, CDE Pager: [phone] After hours Pager: [phone]

## 2014-09-27 LAB — GLUCOSE, CAPILLARY
GLUCOSE-CAPILLARY: 85 mg/dL (ref 65–99)
GLUCOSE-CAPILLARY: 102 mg/dL — AB (ref 65–99)
Glucose-Capillary: 99 mg/dL (ref 65–99)
Glucose-Capillary: 118 mg/dL — ABNORMAL HIGH (ref 65–99)

## 2014-09-27 MED ORDER — LOPERAMIDE HCL 2 MG PO CAPS
2.0000 mg | ORAL_CAPSULE | ORAL | Status: DC | PRN
  Filled 2014-09-27: qty 1

## 2014-09-27 MED ORDER — GABAPENTIN 600 MG PO TABS
300.0000 mg | ORAL_TABLET | Freq: Every day | ORAL | Status: DC
  Filled 2014-09-27: qty 0.5

## 2014-09-27 MED ORDER — LIVING WELL WITH DIABETES BOOK - IN SPANISH
Freq: Once | Status: AC
  Administered 2014-09-27: 16:00:00
  Filled 2014-09-27: qty 1

## 2014-09-27 MED ORDER — METOPROLOL TARTRATE 25 MG PO TABS
25.0000 mg | ORAL_TABLET | Freq: Two times a day (BID) | ORAL | Status: DC
  Administered 2014-09-27 – 2014-09-28 (×2): 25 mg via ORAL
  Filled 2014-09-27 (×3): qty 1

## 2014-09-27 MED ORDER — GABAPENTIN 300 MG PO CAPS
300.0000 mg | ORAL_CAPSULE | Freq: Every day | ORAL | Status: DC
  Administered 2014-09-27: 300 mg via ORAL
  Filled 2014-09-27 (×2): qty 1

## 2014-09-27 MED ORDER — ASPIRIN EC 81 MG PO TBEC
81.0000 mg | DELAYED_RELEASE_TABLET | Freq: Every day | ORAL | Status: DC
  Administered 2014-09-27 – 2014-09-28 (×2): 81 mg via ORAL
  Filled 2014-09-27 (×2): qty 1

## 2014-09-27 MED ORDER — AMLODIPINE BESYLATE 5 MG PO TABS
5.0000 mg | ORAL_TABLET | Freq: Every day | ORAL | Status: DC
  Administered 2014-09-27 – 2014-09-28 (×2): 5 mg via ORAL
  Filled 2014-09-27 (×2): qty 1

## 2014-09-27 MED ORDER — GUAIFENESIN ER 600 MG PO TB12
1200.0000 mg | ORAL_TABLET | Freq: Two times a day (BID) | ORAL | Status: DC | PRN
  Filled 2014-09-27: qty 2

## 2014-09-27 MED ORDER — LEVOFLOXACIN 500 MG PO TABS
750.0000 mg | ORAL_TABLET | ORAL | Status: DC
  Administered 2014-09-27: 750 mg via ORAL
  Filled 2014-09-27: qty 2

## 2014-09-27 MED ORDER — DEXTROMETHORPHAN POLISTIREX ER 30 MG/5ML PO SUER
30.0000 mg | Freq: Two times a day (BID) | ORAL | Status: DC | PRN
  Filled 2014-09-27: qty 5

## 2014-09-27 NOTE — Progress Notes (Signed)
Results for Joyce Gilmore, Joyce Gilmore (MRN [id number]) as of 09/27/2014 16:27  Ref. Range 04/28/2014 15:09 09/24/2014 10:25  Hemoglobin A1C Latest Ref Range: 4.8-5.6 % 6.3 (H) 8.1 (H)   Referral received.  Spoke briefly with RN and she states that patient's daughter has questions regarding her mother's diabetes.  She states that they do not know what to if her mother's blood sugars are high.  She had said that they looked it up on the internet and gave her blueberries to bring down the blood sugar in the past.  RN interpretted for patient/daughter in [place].  Daughter states that patient was taking Lantus 36 units q HS and Novolog 6 units with lunch only.  She states that initially the Dr. told daughter to give Novolog with each meal however b/c her mother did not like being stuck, the Dr. Ricard Dillon the Novolog to "with lunch only". Briefly explained the profiles of Lantus (24 hour long acting insulin) and Novolog (rapid acting insulin).  Daughter states that her blood sugars are sometimes elevated in the mornings.  Explained that her mother may need coverage with supper to cover meal to improve fasting blood sugars.  Discussed results of A1C with patient's daughter also and explained goal A1C.  Ordered dietician consult and "Living Well with Diabetes" booklet in [place].   Daughter was very engaged and had great questions. She states that her mother was recently in [place] for 3 months also which made her diabetes control more sporadic.  Discussed with Dr. Thereasa Solo.  Blood sugars have been well controlled in the hospital.  Thanks, Adah Perl, RN, BC-ADM Inpatient Diabetes Coordinator Pager [phone] (8a-5p)

## 2014-09-27 NOTE — Progress Notes (Signed)
[facility] TEAM 1 - Stepdown/ICU TEAM Progress Note  AVREE SZCZYGIEL UXN:[id number] DOB: 04-Aug-1934 DOA: 09/24/2014 PCP: Vic Blackbird, MD  Admit HPI / Brief Narrative: 80 yo female, spanish speaking, who recently traveled to [place].  Upon her return she felt weak and tired with nausea and nonbloody vomiting over 48 hours with subjective fevers and chills. Son explained that patient had complained about epigastric pain, constant, per patient 10/10 in severity, occasionally but not consistently radiating to the entire abdomen, worse with eating and no specific alleviating factors. Son reported elevated CBGs greater than 300.   Initial work up in the ED was notable for pyelonephritis with concern foremphysematous pyelitis.ID and Urology consulted.   HPI/Subjective: The patient is feeling much better today.  She is sitting up at her bedside and tolerating a regular diet without significant difficulty.  She has not yet ambulated significantly and does admit that she feels somewhat weak when attempting to walk in her room.  She denies chest pain or shortness of breath.  Assessment/Plan:  Severe Sepsis due to Escherichia coli emphysematous pyelonephritis Continue directed abx tx - the organism is sensitive to all tested antibiotics - transition to oral antibiotics today and follow to assure the patient is able to keep this medicine down Levaquin   Acute kidney injury renal function has normalized   DM2 - uncontrolled  CBG currently  reasonably well controlled - R and has noted the patient is very poorly educated in regard to her diabetes care and proper nutrition  - A1c 8.1 - Consult to diabetes educator and nutrition   HTN -  grade 1 Diastolic CHF  Cont BB - titrate medical therapy as blood pressure not yet at goal  Hepatic cirrhosis? Suggested on CT abdom - acute viral hepatitis panel unrevealing - ultrasound suggests cirrhotic architecture of liver - son denies use of alcohol or previous  history of liver dysfunction/cirrhosis - LFTs are normal though protein is low - recheck  coags essentially normal -  perhaps this is related to steatohepatitis  - outpatient monitoring/follow-up will be indicated   HLD  Hold treatment while investigating possibility of cirrhosis  Code Status: FULL Family Communication: Spoke with daughter at bedside  Disposition Plan: transfer to medical bed - PT/OT - ambulate - possible discharge home 24-48 hours   Consultants: Urology ID  Procedures: None  Antibiotics: Ceftriaxone 7/30 > 8/3 Levaquin 8/3 >  DVT prophylaxis: SQ heparin   Objective: Blood pressure 166/60, pulse 68, temperature 98.5 F (36.9 C), temperature source Oral, resp. rate 23, height 4\' 8"  (1.422 m), weight 73.6 kg (162 lb 4.1 oz), SpO2 98 %.  Intake/Output Summary (Last 24 hours) at 09/27/14 1346 Last data filed at 09/27/14 0924  Gross per 24 hour  Intake   1830 ml  Output      0 ml  Net   1830 ml   Exam: General: No acute respiratory distress Lungs: Clear to auscultation bilaterally - no wheezes or crackles Cardiovascular: Regular rate and rhythm without murmur gallop or rub  Abdomen:  nontender, nondistended, soft, bowel sounds positive, no rebound, no ascites, no appreciable mass Extremities: No significant cyanosis, clubbing, or edema bilateral lower extremities  Data Reviewed: Basic Metabolic Panel:  Recent Labs Lab 09/24/14 0425 09/25/14 0459 09/26/14 0220  NA 129* 136 134*  K 3.6 4.1 3.8  CL 101 117* 109  CO2 17* 13* 16*  GLUCOSE 221* 154* 164*  BUN 22* 17 13  CREATININE 1.40* 1.20* 0.98  CALCIUM 7.9*  7.9* 8.6*    CBC:  Recent Labs Lab 09/24/14 0425 09/25/14 0459 09/26/14 0220  WBC 8.0 14.8* 14.2*  NEUTROABS 7.4  --   --   HGB 11.1* 10.6* 10.9*  HCT 32.9* 31.7* 33.5*  MCV 80.0 79.8 80.9  PLT 131* 101* 144*    Liver Function Tests:  Recent Labs Lab 09/24/14 0425 09/26/14 0220  AST 27 33  ALT 22 25  ALKPHOS 112 87    BILITOT 0.9 0.5  PROT 5.7* 6.4*  ALBUMIN 2.7* 2.6*    Recent Labs Lab 09/24/14 0425  LIPASE 20*    Coags:  Recent Labs Lab 09/24/14 0745 09/26/14 0220  INR 1.57* 1.20    Recent Labs Lab 09/24/14 0745  APTT 33    Cardiac Enzymes:  Recent Labs Lab 09/25/14 0459  CKTOTAL 150    CBG:  Recent Labs Lab 09/26/14 1214 09/26/14 1545 09/26/14 2136 09/27/14 0717 09/27/14 1202  GLUCAP 152* 240* 82 99 118*    Recent Results (from the past 240 hour(s))  Urine culture     Status: None   Collection Time: 09/24/14  4:22 AM  Result Value Ref Range Status   Specimen Description URINE, CLEAN CATCH  Final   Special Requests NONE  Final   Culture >=100,000 COLONIES/mL ESCHERICHIA COLI  Final   Report Status 09/26/2014 FINAL  Final   Organism ID, Bacteria ESCHERICHIA COLI  Final      Susceptibility   Escherichia coli - MIC*    AMPICILLIN <=2 SENSITIVE Sensitive     CEFAZOLIN <=4 SENSITIVE Sensitive     CEFTRIAXONE <=1 SENSITIVE Sensitive     CIPROFLOXACIN <=0.25 SENSITIVE Sensitive     GENTAMICIN <=1 SENSITIVE Sensitive     IMIPENEM <=0.25 SENSITIVE Sensitive     NITROFURANTOIN <=16 SENSITIVE Sensitive     TRIMETH/SULFA <=20 SENSITIVE Sensitive     AMPICILLIN/SULBACTAM <=2 SENSITIVE Sensitive     PIP/TAZO <=4 SENSITIVE Sensitive     * >=100,000 COLONIES/mL ESCHERICHIA COLI  Blood culture (routine x 2)     Status: None (Preliminary result)   Collection Time: 09/24/14  7:35 AM  Result Value Ref Range Status   Specimen Description BLOOD RIGHT ANTECUBITAL  Final   Special Requests BOTTLES DRAWN AEROBIC AND ANAEROBIC 10CC  Final   Culture NO GROWTH 2 DAYS  Final   Report Status PENDING  Incomplete  Blood culture (routine x 2)     Status: None (Preliminary result)   Collection Time: 09/24/14  7:45 AM  Result Value Ref Range Status   Specimen Description BLOOD RIGHT HAND  Final   Special Requests BOTTLES DRAWN AEROBIC AND ANAEROBIC 10CC  Final   Culture NO GROWTH 2  DAYS  Final   Report Status PENDING  Incomplete  MRSA PCR Screening     Status: None   Collection Time: 09/24/14  8:56 AM  Result Value Ref Range Status   MRSA by PCR NEGATIVE NEGATIVE Final    Comment:        The GeneXpert MRSA Assay (FDA approved for NASAL specimens only), is one component of a comprehensive MRSA colonization surveillance program. It is not intended to diagnose MRSA infection nor to guide or monitor treatment for MRSA infections.   Clostridium Difficile by PCR (not at [facility])     Status: None   Collection Time: 09/24/14  1:59 PM  Result Value Ref Range Status   Toxigenic C Difficile by pcr NEGATIVE NEGATIVE Final     Studies:   Recent  x-ray studies have been reviewed in detail by the Attending Physician  Scheduled Meds:  Scheduled Meds: . cefTRIAXone (ROCEPHIN)  IV  1 g Intravenous Q24H  . dextromethorphan  30 mg Oral BID  . feeding supplement (GLUCERNA SHAKE)  237 mL Oral BID BM  . guaiFENesin  1,200 mg Oral BID  . heparin  5,000 Units Subcutaneous 3 times per day  . insulin aspart  0-15 Units Subcutaneous TID WC  . insulin aspart  3 Units Subcutaneous TID WC  . insulin glargine  36 Units Subcutaneous QHS  . metoprolol tartrate  12.5 mg Oral BID  . sodium chloride  3 mL Intravenous Q12H    Time spent on care of this patient: 35 mins   Saher Davee T , MD   [facility] Office  [phone] Pager - Text Page per Shea Evans as per below:  On-Call/Text Page:      [email or web address]      password [email or web address]  If 7PM-7AM, please contact night-coverage [email or web address] Password [email or web address] 09/27/2014, 1:46 PM   LOS: 3 days

## 2014-09-28 DIAGNOSIS — I5030 Unspecified diastolic (congestive) heart failure: Secondary | ICD-10-CM

## 2014-09-28 DIAGNOSIS — I270 Primary pulmonary hypertension: Secondary | ICD-10-CM

## 2014-09-28 DIAGNOSIS — I10 Essential (primary) hypertension: Secondary | ICD-10-CM

## 2014-09-28 DIAGNOSIS — I272 Pulmonary hypertension, unspecified: Secondary | ICD-10-CM | POA: Diagnosis present

## 2014-09-28 LAB — GLUCOSE, CAPILLARY
GLUCOSE-CAPILLARY: 106 mg/dL — AB (ref 65–99)
Glucose-Capillary: 89 mg/dL (ref 65–99)

## 2014-09-28 LAB — CBC
HCT: 34 % — ABNORMAL LOW (ref 36.0–46.0)
Hemoglobin: 11.4 g/dL — ABNORMAL LOW (ref 12.0–15.0)
MCH: 26.5 pg (ref 26.0–34.0)
MCHC: 33.5 g/dL (ref 30.0–36.0)
MCV: 79.1 fL (ref 78.0–100.0)
Platelets: 185 10*3/uL (ref 150–400)
RBC: 4.3 MIL/uL (ref 3.87–5.11)
RDW: 14.6 % (ref 11.5–15.5)
WBC: 8.7 10*3/uL (ref 4.0–10.5)

## 2014-09-28 MED ORDER — INSULIN ASPART 100 UNIT/ML FLEXPEN: PEN_INJECTOR | SUBCUTANEOUS | Status: DC

## 2014-09-28 MED ORDER — METOPROLOL TARTRATE 25 MG PO TABS: 25.0000 mg | ORAL_TABLET | Freq: Two times a day (BID) | ORAL | Status: DC

## 2014-09-28 MED ORDER — LEVOFLOXACIN 750 MG PO TABS: 750.0000 mg | ORAL_TABLET | ORAL | Status: DC

## 2014-09-28 MED ORDER — AMLODIPINE BESYLATE 5 MG PO TABS: 10.0000 mg | ORAL_TABLET | Freq: Every day | ORAL | Status: DC

## 2014-09-28 MED ORDER — GABAPENTIN 300 MG PO CAPS: 300.0000 mg | ORAL_CAPSULE | Freq: Every day | ORAL | Status: DC

## 2014-09-28 MED ORDER — INSULIN GLARGINE 100 UNIT/ML SOLOSTAR PEN: 36.0000 [IU] | PEN_INJECTOR | Freq: Every day | SUBCUTANEOUS | Status: DC

## 2014-09-28 NOTE — Progress Notes (Signed)
OT Cancellation Note  Patient Details Name: Joyce Gilmore MRN: [id number] DOB: 1934-11-26   Cancelled Treatment:    Reason Eval/Treat Not Completed: Other (comment) Pt beingdischarged.Family to assist after D/C as needed. [place], OTR/L  [phone] 09/28/2014 09/28/2014, 3:23 PM

## 2014-09-28 NOTE — Progress Notes (Signed)
Interpreter Lesle Chris

## 2014-09-28 NOTE — Plan of Care (Addendum)
Problem: Food- and Nutrition-Related Knowledge Deficit (NB-1.1) Goal: Nutrition education Formal process to instruct or train a patient/client in a skill or to impart knowledge to help patients/clients voluntarily manage or modify food choices and eating behavior to maintain or improve health. Outcome: Completed/Met Date Met:  09/28/14  RD consulted for nutrition education regarding diabetes.  Spanish interpretation provided by Lesle Chris.    Lab Results  Component Value Date    HGBA1C 8.1* 09/24/2014    RD provided "Carbohydrate Counting for People with Diabetes" handout from the Academy of [employer] Dietetics. Discussed different food groups and their effects on blood sugar, emphasizing carbohydrate-containing foods. Provided list of carbohydrates and recommended serving sizes of common foods.  Discussed importance of controlled and consistent carbohydrate intake throughout the day. Provided examples of ways to balance meals/snacks and encouraged intake of high-fiber, whole grain complex carbohydrates. Teach back method used.  Expect fair compliance.  Arthur Holms, RD, LDN Pager #: [phone] After-Hours Pager #: [phone]

## 2014-09-28 NOTE — Discharge Summary (Signed)
Physician Discharge Summary  Joyce Gilmore EBR:[id number] DOB: 1934/04/02 DOA: 09/24/2014  PCP: Vic Blackbird, MD  Admit date: 09/24/2014 Discharge date: 09/28/2014  Time spent: 40 minutes  Recommendations for Outpatient Follow-up:  Severe Sepsis due to Escherichia coli emphysematous pyelonephritis -Continue antibiotics for a ten-day course    Acute kidney injury renal function improving with volume resuscitation - follow trend  Pulmonary hypertension/diastolic CHF/Essential Hypertension -Metoprolol 25 mg BID   DM Type2 - uncontrolled  -CBG currently well-controlled -Discharge on Lantus 36 units QHS -Discharge on NovoLog 3 units with meals  Hepatic cirrhosis -Suggested on CT abdom - acute viral hepatitis panel unrevealing - ultrasound suggests cirrhotic architecture of liver - son denies use of alcohol or previous history of liver dysfunction/cirrhosis - LFTs are normal though protein is low  - Coags basically WNL   HLD  -Hold treatment while investigating possibility of cirrhosis -PCP to follow  Essential HTN -Increase Norvasc to 10 mg daily -Continue metoprolol 25 mg BID -Will have PCP restart lisinopril if appropriate in the future after patient clears her UTI.     Discharge Diagnoses:  Principal Problem:   Sepsis Active Problems:   Diabetes mellitus type II, controlled   UTI (lower urinary tract infection)   Acute renal failure   Emphysematous pyelitis   Hepatic cirrhosis   Metabolic acidosis   Prosthetic hip infection   Severe sepsis   Diabetes type 2, uncontrolled   Other cirrhosis of liver   Acute pyelonephritis   Pulmonary hypertension   Congestive heart disease   Discharge Condition: Stable  Diet recommendation: Diabetic  Filed Weights   09/24/14 0900 09/26/14 0500 09/27/14 0427  Weight: 72.3 kg (159 lb 6.3 oz) 73.1 kg (161 lb 2.5 oz) 73.6 kg (162 lb 4.1 oz)    History of present illness:  80 yo HF(spanish speaking) PMHx Depression, HTN  diabetes type 2, CKD, Nephrolithiasis, who recently traveled to [place]. Upon her return she felt weak and tired with nausea and nonbloody vomiting over 48 hours with subjective fevers and chills. Son explained that patient had complained about epigastric pain, constant, per patient 10/10 in severity, occasionally but not consistently radiating to the entire abdomen, worse with eating and no specific alleviating factors. Son reported elevated CBGs greater than 300.   Initial work up in the ED was notable for pyelonephritis with concern foremphysematous pyelitis.ID and Urology consulted. During his hospitalization patient was diagnosed with Escherichia coli pyelonephritis. Patient was treated with appropriate antibiotics and defervesced. Currently asymptomatic. In addition patient was found to have uncontrolled diabetes. Patient was counseled on proper control of her diabetes and the diabetic corner with patient and family to educate on controlling diabetes in the future.   Consultants: Dr.Sigmund Gaynelle Arabian ( Urology) Dr.Cornelius N Lucianne Lei Dam ( ID)  Procedure/Significant Events: 7/31 CT abdomen pelvis without contrast;-Bilateral perinephric stranding/inflammation with focus of gas within the right intrarenal collecting system. Suspicious for infection, especially on the right which likely represents early emphysematous pyelitis. -Possible cirrhosis. -Cardiomegaly. 7/31 ultrasound abdomen;-Hepatic cirrhosis. No liver mass or ascites visualized. 8/1 echocardiogram;- LVEF= 60% to 65%.-(grade 1 diastolic dysfunction). - Pulmonary arteries: PA peak pressure: 44 mm Hg (S).   Culture 7/31 urine positive Escherichia coli pansensitive 7/31 blood right AC/HAND NGTD 7/31 MRSA by PCR negative 7/31 C. difficile by PCR negative   Antibiotics Ceftriaxone 7/30 > 8/3 Levaquin 8/3 >    Discharge Exam: Filed Vitals:   09/27/14 1956 09/28/14 0002 09/28/14 0405 09/28/14 0704  BP: 162/66 153/61  140/51 156/61  Pulse: 73 62 65 58  Temp:  98.7 F (37.1 C) 98.3 F (36.8 C) 98.5 F (36.9 C)  TempSrc:  Oral Oral Oral  Resp: $Remo'19 23 18 17  'UeZDH$ Height:      Weight:      SpO2: 98% 97% 97% 97%    General: A/O 4, NAD, No acute respiratory distress Eyes: Negative headache, eye pain, scleral hemorrhage ENT: Negative Runny nose, negative ear pain, negative tinnitus, negative gingival bleeding Neck: Negative scars, masses, torticollis, lymphadenopathy, JVD Lungs: Clear to auscultation bilaterally without wheezes or crackles Cardiovascular: Regular rate and rhythm without murmur gallop or rub normal S1 and S2 Abdomen:negative abdominal pain, , negative CVA tenderness, negative dysphagia, Nontender, nondistended, soft, bowel sounds positive, no rebound, no ascites, no appreciable mass    Discharge Instructions     Medication List    STOP taking these medications        lisinopril 10 MG tablet  Commonly known as:  PRINIVIL,ZESTRIL     pravastatin 20 MG tablet  Commonly known as:  PRAVACHOL      TAKE these medications        ACCU-CHEK AVIVA PLUS test strip  Generic drug:  glucose blood  USE AS DIRECTED UP TO FOUR TIMES DAILY     acetaminophen 160 MG/5ML suspension  Commonly known as:  TYLENOL  Take 650 mg by mouth every 6 (six) hours as needed for moderate pain.     amLODipine 5 MG tablet  Commonly known as:  NORVASC  Take 2 tablets (10 mg total) by mouth daily.     blood glucose meter kit and supplies Kit  Dispense based on patient and insurance preference. Use up to four times daily as directed. (FOR ICD-9 250.00, 250.01).     gabapentin 300 MG capsule  Commonly known as:  NEURONTIN  Take 1 capsule (300 mg total) by mouth at bedtime.     insulin aspart 100 UNIT/ML FlexPen  Commonly known as:  NOVOLOG  Give 3 units with Lunch     Insulin Glargine 100 UNIT/ML Solostar Pen  Commonly known as:  LANTUS SOLOSTAR  Inject 36 Units into the skin at bedtime.     Insulin  Pen Needle 31G X 8 MM Misc  Commonly known as:  B-D ULTRAFINE III SHORT PEN  1 each by Does not apply route 3 (three) times daily.     lansoprazole 30 MG capsule  Commonly known as:  PREVACID  Take 1 capsule (30 mg total) by mouth 2 (two) times daily before a meal.     levofloxacin 750 MG tablet  Commonly known as:  LEVAQUIN  Take 1 tablet (750 mg total) by mouth every other day.     metoprolol tartrate 25 MG tablet  Commonly known as:  LOPRESSOR  Take 1 tablet (25 mg total) by mouth 2 (two) times daily.     sucralfate 1 G tablet  Commonly known as:  CARAFATE  Take 1 tablet (1 g total) by mouth 4 (four) times daily.       Allergies  Allergen Reactions  . Bactrim [Sulfamethoxazole-Trimethoprim]     Hyperkalemia  . Rifampin Other (See Comments)    Severe thrombocytopenia  . Vancomycin Other (See Comments)    Severe thrombocytopenia   Follow-up Information    Follow up with Vic Blackbird, MD. Schedule an appointment as soon as possible for a visit in 1 week.   Specialty:  Family Medicine   Why:  Follow-up in one week with  PCP for uncontrolled diabetes, pyelonephritis.   Contact information:   [address] [place] [address] [phone]        The results of significant diagnostics from this hospitalization (including imaging, microbiology, ancillary and laboratory) are listed below for reference.    Significant Diagnostic Studies: Ct Abdomen Pelvis Wo Contrast  09/24/2014   CLINICAL DATA:  80 year old female with diffuse abdominal and pelvic pain, nausea, vomiting, diarrhea and fever for 5 days.  EXAM: CT ABDOMEN AND PELVIS WITHOUT CONTRAST  TECHNIQUE: Multidetector CT imaging of the abdomen and pelvis was performed following the standard protocol without IV contrast.  COMPARISON:  12/22/2013 and prior CTs.  06/03/2014 chest radiograph  FINDINGS: Please note that parenchymal abnormalities may be missed without intravenous contrast.  Right hip replacement  metallic artifact obscures detail within the pelvis.  Lower chest: Mild basilar atelectasis noted. Cardiomegaly is again identified.  Hepatobiliary: Slightly nodular hepatic contour and hepatic configuration may represents cirrhosis. Correlate clinically. No focal hepatic lesions are identified. Equivocal mild pericholecystic stranding/ inflammation noted. There is no evidence of biliary dilatation.  Pancreas: Unremarkable  Spleen: Unremarkable  Adrenals/Urinary Tract: New bilateral perinephric stranding/ inflammation noted which is nonspecific but infection is not excluded. A focus of gas within the right kidney (image 33) is suspicious for early emphysematous pyelitis.  Moderate -severe right renal atrophy and right renal calculi are again identified. It would  There is no evidence of hydronephrosis or obstructing urinary calculi.  Stomach/Bowel: Descending and sigmoid colonic diverticulosis noted without diverticulitis. There is no evidence of bowel obstruction  Vascular/Lymphatic: No enlarged lymph nodes or abdominal aortic aneurysm.  Reproductive: Unchanged with probable uterine fibroid.  Other: No free fluid, pneumoperitoneum or definite abscess.  Musculoskeletal: No acute abnormalities are noted. Right total hip replacement, T8 compression fracture, degenerative changes within the lumbar spine and remote right inferior pubic ramus fracture again identified.  IMPRESSION: New bilateral perinephric stranding/inflammation with focus of gas within the right intrarenal collecting system. This is suspicious for infection, especially on the right which likely represents early emphysematous pyelitis.  Equivocal mild pericholecystic stranding/inflammation. Consider ultrasound or nuclear medicine study if there is strong clinical suspicion for acute cholecystitis.  Possible cirrhosis.  Correlate clinically.  Cardiomegaly.   Electronically Signed   By: Margarette Canada M.D.   On: 09/24/2014 08:43   Dg Chest 2  View  09/24/2014   CLINICAL DATA:  Sepsis  EXAM: CHEST  2 VIEW  COMPARISON:  Radiograph 06/03/2014  FINDINGS: Stable cardiac silhouette. There is chronic central peribronchial thickening. No overt pulmonary edema. Wedge compression fracture in the mid thoracic spine unchanged.  IMPRESSION: 1. Chronic peribronchial thickening not changed. 2. No acute findings.   Electronically Signed   By: Suzy Bouchard M.D.   On: 09/24/2014 08:19   US Abdomen Complete  09/24/2014   CLINICAL DATA:  Generalized abdominal pain, fever, and nausea and vomiting for 5 days. Pericholecystic stranding seen on recent CT.  EXAM: ULTRASOUND ABDOMEN COMPLETE  COMPARISON:  CT on 09/24/2014 and ultrasound on 12/14/2013  FINDINGS: Technically difficult exam as patient could not perform breath-holding.  Gallbladder: No gallstones or wall thickening visualized. No sonographic Murphy sign noted.  Common bile duct: Diameter: 3 mm, within normal limits.  Liver: No focal lesion identified. Mild capsular nodularity, consistent with hepatic cirrhosis better demonstrated on recent CT.  IVC: No abnormality visualized.  Pancreas: Visualized portion unremarkable.  Spleen: Size and appearance within normal limits.  Right Kidney: Length: 9.0 cm. Right renal  parenchymal scarring and atrophy again noted. Mild right renal pelvicaliectasis, similar to prior exams.  Left Kidney: Length: 11.6 cm. Echogenicity within normal limits. No mass or hydronephrosis visualized.  Abdominal aorta: No aneurysm visualized.  Other findings: None.  IMPRESSION: Technically suboptimal exam as discussed above. No gallstones visualized. No evidence of biliary dilatation.  Hepatic cirrhosis.  No liver mass or ascites visualized.  Right renal parenchymal scarring and atrophy. Mild right renal pelvicaliectasis, without significant change compared to prior exams.   Electronically Signed   By: Earle Gell M.D.   On: 09/24/2014 14:05    Microbiology: Recent Results (from the past  240 hour(s))  Urine culture     Status: None   Collection Time: 09/24/14  4:22 AM  Result Value Ref Range Status   Specimen Description URINE, CLEAN CATCH  Final   Special Requests NONE  Final   Culture >=100,000 COLONIES/mL ESCHERICHIA COLI  Final   Report Status 09/26/2014 FINAL  Final   Organism ID, Bacteria ESCHERICHIA COLI  Final      Susceptibility   Escherichia coli - MIC*    AMPICILLIN <=2 SENSITIVE Sensitive     CEFAZOLIN <=4 SENSITIVE Sensitive     CEFTRIAXONE <=1 SENSITIVE Sensitive     CIPROFLOXACIN <=0.25 SENSITIVE Sensitive     GENTAMICIN <=1 SENSITIVE Sensitive     IMIPENEM <=0.25 SENSITIVE Sensitive     NITROFURANTOIN <=16 SENSITIVE Sensitive     TRIMETH/SULFA <=20 SENSITIVE Sensitive     AMPICILLIN/SULBACTAM <=2 SENSITIVE Sensitive     PIP/TAZO <=4 SENSITIVE Sensitive     * >=100,000 COLONIES/mL ESCHERICHIA COLI  Blood culture (routine x 2)     Status: None (Preliminary result)   Collection Time: 09/24/14  7:35 AM  Result Value Ref Range Status   Specimen Description BLOOD RIGHT ANTECUBITAL  Final   Special Requests BOTTLES DRAWN AEROBIC AND ANAEROBIC 10CC  Final   Culture NO GROWTH 3 DAYS  Final   Report Status PENDING  Incomplete  Blood culture (routine x 2)     Status: None (Preliminary result)   Collection Time: 09/24/14  7:45 AM  Result Value Ref Range Status   Specimen Description BLOOD RIGHT HAND  Final   Special Requests BOTTLES DRAWN AEROBIC AND ANAEROBIC 10CC  Final   Culture NO GROWTH 3 DAYS  Final   Report Status PENDING  Incomplete  MRSA PCR Screening     Status: None   Collection Time: 09/24/14  8:56 AM  Result Value Ref Range Status   MRSA by PCR NEGATIVE NEGATIVE Final    Comment:        The GeneXpert MRSA Assay (FDA approved for NASAL specimens only), is one component of a comprehensive MRSA colonization surveillance program. It is not intended to diagnose MRSA infection nor to guide or monitor treatment for MRSA infections.    Clostridium Difficile by PCR (not at [facility])     Status: None   Collection Time: 09/24/14  1:59 PM  Result Value Ref Range Status   Toxigenic C Difficile by pcr NEGATIVE NEGATIVE Final     Labs: Basic Metabolic Panel:  Recent Labs Lab 09/24/14 0425 09/25/14 0459 09/26/14 0220  NA 129* 136 134*  K 3.6 4.1 3.8  CL 101 117* 109  CO2 17* 13* 16*  GLUCOSE 221* 154* 164*  BUN 22* 17 13  CREATININE 1.40* 1.20* 0.98  CALCIUM 7.9* 7.9* 8.6*   Liver Function Tests:  Recent Labs Lab 09/24/14 0425 09/26/14 0220  AST 27  33  ALT 22 25  ALKPHOS 112 87  BILITOT 0.9 0.5  PROT 5.7* 6.4*  ALBUMIN 2.7* 2.6*    Recent Labs Lab 09/24/14 0425  LIPASE 20*   No results for input(s): AMMONIA in the last 168 hours. CBC:  Recent Labs Lab 09/24/14 0425 09/25/14 0459 09/26/14 0220 09/28/14 0228  WBC 8.0 14.8* 14.2* 8.7  NEUTROABS 7.4  --   --   --   HGB 11.1* 10.6* 10.9* 11.4*  HCT 32.9* 31.7* 33.5* 34.0*  MCV 80.0 79.8 80.9 79.1  PLT 131* 101* 144* 185   Cardiac Enzymes:  Recent Labs Lab 09/25/14 0459  CKTOTAL 150   BNP: BNP (last 3 results) No results for input(s): BNP in the last 8760 hours.  ProBNP (last 3 results) No results for input(s): PROBNP in the last 8760 hours.  CBG:  Recent Labs Lab 09/27/14 1202 09/27/14 1750 09/27/14 2242 09/28/14 0744 09/28/14 1223  GLUCAP 118* 85 102* 89 106*       Signed:  Dia Crawford, MD [facility] [phone] pager

## 2014-09-28 NOTE — Evaluation (Signed)
Physical Therapy SECOND Evaluation (See also evaluation on 09/25/14) and DISCHARGE Patient Details Name: Joyce Gilmore MRN: [id number] DOB: February 15, 1935 Today's Date: 09/28/2014   History of Present Illness  Adm 09/24/14 with urosepsis PMHx- bil TKR, Rt THR with revision x 2; DM; renal failure; hepatic cirrhosis; bil HOH; osteopenia  Clinical Impression  Patient evaluated by Physical Therapy with no further acute PT needs identified. Patient remains safe to ambulate with nursing assist (due to lines/monitors). Educated patient (via interpreter) and RN of need to ambulate 2-3 times per day. Patient understands she may request to ambulate even if nursing has not offered. PT is signing off.     Follow Up Recommendations No PT follow up    Equipment Recommendations  None recommended by PT    Recommendations for Other Services       Precautions / Restrictions Precautions Precautions: None Restrictions Weight Bearing Restrictions: No      Mobility  Bed Mobility                  Transfers Overall transfer level: Modified independent Equipment used: Straight cane Transfers: Sit to/from Stand Sit to Stand: Modified independent (Device/Increase time)         General transfer comment: denied dizziness  Ambulation/Gait Ambulation/Gait assistance: Min guard;Supervision Ambulation Distance (Feet): 400 Feet Assistive device: Straight cane Gait Pattern/deviations: Step-through pattern;Decreased stride length;Drifts right/left Gait velocity: decr due to dyspnea Gait velocity interpretation: Below normal speed for age/gender General Gait Details: occasional drift off path when turning head, no LOB  Stairs            Wheelchair Mobility    Modified Rankin (Stroke Patients Only)       Balance Overall balance assessment: No apparent balance deficits (not formally assessed)                                           Pertinent Vitals/Pain HR  96-106 SaO2 96% on room air with activity  Pain Assessment: No/denies pain    Home Living Family/patient expects to be discharged to:: Private residence Living Arrangements: Children Available Help at Discharge: Family;Available PRN/intermittently           Home Equipment: [name] - single point Additional Comments: daughter denies other DME    Prior Function Level of Independence: Independent with assistive device(s)         Comments: with cane     Hand Dominance        Extremity/Trunk Assessment   Upper Extremity Assessment: Overall WFL for tasks assessed           Lower Extremity Assessment: Overall WFL for tasks assessed      Cervical / Trunk Assessment: Normal  Communication   Communication: Prefers language other than English;HOH;Interpreter utilized ([place]; )  Cognition Arousal/Alertness: Awake/alert Behavior During Therapy: WFL for tasks assessed/performed Overall Cognitive Status: Within Functional Limits for tasks assessed                      General Comments General comments (skin integrity, edema, etc.): Via interpreter pt states she has not walked in hall since PT worked with her 8/1. Educated to ask nurses to walk with her in hallway.    Exercises        Assessment/Plan    PT Assessment Patent does not need any further PT services  PT Diagnosis Difficulty walking  PT Problem List    PT Treatment Interventions     PT Goals (Current goals can be found in the Care Plan section) Acute Rehab PT Goals PT Goal Formulation: All assessment and education complete, DC therapy    Frequency     Barriers to discharge        Co-evaluation               End of Session Equipment Utilized During Treatment: Gait belt Activity Tolerance: Patient tolerated treatment well Patient left: with call bell/phone within reach;with family/visitor present;in chair Nurse Communication: Mobility status (d/c from PT; needs to walk 2-3 times  per day)         Time: 8550-1586 PT Time Calculation (min) (ACUTE ONLY): 18 min   Charges:   PT Evaluation $Initial PT Evaluation Tier I: 1 Procedure     PT G Codes:        Joyce Gilmore 10-18-2014, 10:28 AM Pager [phone]

## 2014-09-28 NOTE — Progress Notes (Signed)
Provided discharge instructions to patient and family member. Patient and family member verbalized understanding and signed a copy of forms. Along with discharge instructions patient was provided a Living Well with Diabetes booklet in Spanish. PIV removed and all belongs packed to be sent home with patient.

## 2014-09-29 LAB — CULTURE, BLOOD (ROUTINE X 2)
CULTURE: NO GROWTH
Culture: NO GROWTH

## 2014-10-04 ENCOUNTER — Ambulatory Visit (INDEPENDENT_AMBULATORY_CARE_PROVIDER_SITE_OTHER): Payer: Medicaid Other | Admitting: Physician Assistant

## 2014-10-04 ENCOUNTER — Encounter: Payer: Self-pay | Admitting: Physician Assistant

## 2014-10-04 VITALS — BP 100/60 | HR 82 | Temp 98.0°F | Resp 18 | Wt 142.5 lb

## 2014-10-04 DIAGNOSIS — IMO0002 Reserved for concepts with insufficient information to code with codable children: Secondary | ICD-10-CM

## 2014-10-04 DIAGNOSIS — N10 Acute tubulo-interstitial nephritis: Secondary | ICD-10-CM | POA: Diagnosis not present

## 2014-10-04 DIAGNOSIS — K7469 Other cirrhosis of liver: Secondary | ICD-10-CM

## 2014-10-04 DIAGNOSIS — E1165 Type 2 diabetes mellitus with hyperglycemia: Secondary | ICD-10-CM

## 2014-10-04 DIAGNOSIS — R6520 Severe sepsis without septic shock: Secondary | ICD-10-CM

## 2014-10-04 DIAGNOSIS — A419 Sepsis, unspecified organism: Secondary | ICD-10-CM | POA: Diagnosis not present

## 2014-10-04 DIAGNOSIS — N390 Urinary tract infection, site not specified: Secondary | ICD-10-CM

## 2014-10-04 DIAGNOSIS — I10 Essential (primary) hypertension: Secondary | ICD-10-CM

## 2014-10-04 DIAGNOSIS — E119 Type 2 diabetes mellitus without complications: Secondary | ICD-10-CM | POA: Diagnosis not present

## 2014-10-04 DIAGNOSIS — E785 Hyperlipidemia, unspecified: Secondary | ICD-10-CM

## 2014-10-04 MED ORDER — LEVOFLOXACIN 750 MG PO TABS: 750.0000 mg | ORAL_TABLET | Freq: Every day | ORAL | Status: DC

## 2014-10-04 MED ORDER — INSULIN GLARGINE 100 UNIT/ML SOLOSTAR PEN: 36.0000 [IU] | PEN_INJECTOR | Freq: Every day | SUBCUTANEOUS | Status: DC

## 2014-10-04 MED ORDER — LISINOPRIL 10 MG PO TABS: 10.0000 mg | ORAL_TABLET | Freq: Every day | ORAL | Status: DC

## 2014-10-04 MED ORDER — INSULIN ASPART 100 UNIT/ML FLEXPEN: PEN_INJECTOR | SUBCUTANEOUS | Status: DC

## 2014-10-04 MED ORDER — AMLODIPINE BESYLATE 5 MG PO TABS: 5.0000 mg | ORAL_TABLET | Freq: Every day | ORAL | Status: DC

## 2014-10-04 MED ORDER — PRAVASTATIN SODIUM 20 MG PO TABS: 20.0000 mg | ORAL_TABLET | Freq: Every day | ORAL | Status: DC

## 2014-10-04 NOTE — Progress Notes (Signed)
Patient ID: Joyce Gilmore MRN: [id number], DOB: [birth year], 80 y.o. Date of Encounter: $[email or web address]'[email or web address]$ @  Chief Complaint:  Chief Complaint  Patient presents with  . Hospitalization Follow-up    Kidney infection was admitted 07/31 was discharged Aug 4    HPI: 80 y.o. year old female  presents with female family member who speaks [place] and [place].  She is here to follow up recent hospitalization. Was hospitalized 09/24/2014 through 09/28/14. THE FOLLOWING IS COPIED FROM THAT DISCHARGE SUMMARY: History of present illness:  80 yo HF(spanish speaking) PMHx Depression, HTN diabetes type 2, CKD, Nephrolithiasis, who recently traveled to [place]. Upon her return she felt weak and tired with nausea and nonbloody vomiting over 48 hours with subjective fevers and chills. Son explained that patient had complained about epigastric pain, constant, per patient 10/10 in severity, occasionally but not consistently radiating to the entire abdomen, worse with eating and no specific alleviating factors. Son reported elevated CBGs greater than 300.   Initial work up in the ED was notable for pyelonephritis with concern foremphysematous pyelitis.ID and Urology consulted. During his hospitalization patient was diagnosed with Escherichia coli pyelonephritis. Patient was treated with appropriate antibiotics and defervesced. Currently asymptomatic. In addition patient was found to have uncontrolled diabetes. Patient was counseled on proper control of her diabetes and the diabetic corner with patient and family to educate on controlling diabetes in the future.   Recommendations for Outpatient Follow-up:  Severe Sepsis due to Escherichia coli emphysematous pyelonephritis -Continue antibiotics for a ten-day course   Acute kidney injury renal function improving with volume resuscitation - follow trend  Pulmonary hypertension/diastolic CHF/Essential Hypertension -Metoprolol 25 mg BID   DM Type2 - uncontrolled    -CBG currently well-controlled -Discharge on Lantus 36 units QHS -Discharge on NovoLog 3 units with meals  Hepatic cirrhosis -Suggested on CT abdom - acute viral hepatitis panel unrevealing - ultrasound suggests cirrhotic architecture of liver - son denies use of alcohol or previous history of liver dysfunction/cirrhosis - LFTs are normal though protein is low  - Coags basically WNL   HLD  -Hold treatment while investigating possibility of cirrhosis -PCP to follow  Essential HTN -Increase Norvasc to 10 mg daily -Continue metoprolol 25 mg BID -Will have PCP restart lisinopril if appropriate in the future after patient clears her UTI.   TODAY--10/04/2014: The family member with her says that she was given no antibiotic to take at home. I reviewed the discharge summary list of medications for patient to take and that list does include Levaquin.Discharge summary says to take this to complete 10day course of antibiotics I pointed to this medicine on the print out that I have from the discharge summary. Explained to family member that according to the discharge summary, Levaquin is on the list of medicines for patient to be taking at home. Family member says that they know nothing about this and that she is not taking this.  They do report that the symptoms that Joyce Gilmore had when she went to the hospital improved at the hospital and have continued to improve. She has not noticed recurrence of symptoms or worsening of symptoms since being home (since being off  Antibiotic). She is having no dysuria. No increased abdominal pain or back pain and no fevers or chills.  Past Medical History  Diagnosis Date  . Hypertension   . Depression     hx of   . Diabetes mellitus without complication   . Chronic kidney disease  hx of kidney stones,   . Arthritis   . Hearing loss of both ears   . Tinnitus of both ears   . MRSA infection     Oct 13 - Nov 13  . Ganglion, left ankle and foot   .  Osteoporosis      Home Meds: Outpatient Prescriptions Prior to Visit  Medication Sig Dispense Refill  . ACCU-CHEK AVIVA PLUS test strip USE AS DIRECTED UP TO FOUR TIMES DAILY 100 each 3  . acetaminophen (TYLENOL) 160 MG/5ML suspension Take 650 mg by mouth every 6 (six) hours as needed for moderate pain.    . blood glucose meter kit and supplies KIT Dispense based on patient and insurance preference. Use up to four times daily as directed. (FOR ICD-9 250.00, 250.01). 1 each 0  . gabapentin (NEURONTIN) 300 MG capsule Take 1 capsule (300 mg total) by mouth at bedtime. 30 capsule 0  . Insulin Pen Needle (B-D ULTRAFINE III SHORT PEN) 31G X 8 MM MISC 1 each by Does not apply route 3 (three) times daily. 90 each 11  . lansoprazole (PREVACID) 30 MG capsule Take 1 capsule (30 mg total) by mouth 2 (two) times daily before a meal. 60 capsule 5  . metoprolol tartrate (LOPRESSOR) 25 MG tablet Take 1 tablet (25 mg total) by mouth 2 (two) times daily. 60 tablet 0  . sucralfate (CARAFATE) 1 G tablet Take 1 tablet (1 g total) by mouth 4 (four) times daily. 120 tablet 2  . amLODipine (NORVASC) 5 MG tablet Take 2 tablets (10 mg total) by mouth daily. 60 tablet 0  . insulin aspart (NOVOLOG) 100 UNIT/ML FlexPen Give 3 units with Lunch 15 mL 11  . Insulin Glargine (LANTUS SOLOSTAR) 100 UNIT/ML Solostar Pen Inject 36 Units into the skin at bedtime. 5 pen 11  . levofloxacin (LEVAQUIN) 750 MG tablet Take 1 tablet (750 mg total) by mouth every other day. (Patient not taking: Reported on 10/04/2014) 4 tablet 0   No facility-administered medications prior to visit.    Allergies:  Allergies  Allergen Reactions  . Bactrim [Sulfamethoxazole-Trimethoprim]     Hyperkalemia  . Rifampin Other (See Comments)    Severe thrombocytopenia  . Vancomycin Other (See Comments)    Severe thrombocytopenia    Social History   Social History  . Marital Status: Widowed    Spouse Name: N/A  . Number of Children: 7  . Years of  Education: N/A   Occupational History  . Not on file.   Social History Main Topics  . Smoking status: Never Smoker   . Smokeless tobacco: Never Used  . Alcohol Use: No  . Drug Use: No  . Sexual Activity: No   Other Topics Concern  . Not on file   Social History Narrative   ** Merged History Encounter **        Family History  Problem Relation Age of Onset  . Colon cancer Neg Hx   . Hypertension Mother      Review of Systems:  See HPI for pertinent ROS. All other ROS negative.    Physical Exam: Blood pressure 100/60, pulse 82, temperature 98 F (36.7 C), temperature source Oral, resp. rate 18, weight 142 lb 8 oz (64.638 kg)., Body mass index is 31.97 kg/(m^2). General: Petite Hispanic Female. Appears in no acute distress. Neck: Supple. No thyromegaly. No lymphadenopathy. Lungs: Clear bilaterally to auscultation without wheezes, rales, or rhonchi. Breathing is unlabored. Heart: RRR with S1 S2. No murmurs, rubs,  or gallops. Abdomen: Soft, non-tender, non-distended with normoactive bowel sounds. No hepatomegaly. No rebound/guarding. No obvious abdominal masses. Musculoskeletal:  Strength and tone normal for age. No costophrenic angle tenderness with percussion. Extremities/Skin: Warm and dry. Neuro: Alert and oriented X 3. Moves all extremities spontaneously. Gait is normal. CNII-XII grossly in tact. Psych:  Responds to questions appropriately with a normal affect.     ASSESSMENT AND PLAN:  80 y.o. year old female with    Recommendations for Outpatient Follow-up:  Severe Sepsis due to Escherichia coli emphysematous pyelonephritis -Continue antibiotics for a ten-day course   Acute kidney injury renal function improving with volume resuscitation - follow trend  Pulmonary hypertension/diastolic CHF/Essential Hypertension -Metoprolol 25 mg BID  DM Type2 - uncontrolled  -CBG currently well-controlled -Discharge on Lantus 36 units QHS -Discharge on NovoLog 3 units  with meals  Hepatic cirrhosis -Suggested on CT abdom - acute viral hepatitis panel unrevealing - ultrasound suggests cirrhotic architecture of liver - son denies use of alcohol or previous history of liver dysfunction/cirrhosis - LFTs are normal though protein is low  - Coags basically WNL   HLD  -Hold treatment while investigating possibility of cirrhosis -PCP to follow  Essential HTN -Increase Norvasc to 10 mg daily -Continue metoprolol 25 mg BID -Will have PCP restart lisinopril if appropriate in the future after patient clears her UTI.  1. Essential hypertension Will decrease Norvasc back down to 5 mg daily.  Will restart lisinopril 10 mg daily.  --------Renal function was back to normal on 09/26/14 BUN 13 creatinine 0.98.  2. Diabetes mellitus type II, controlled  3. Acute pyelonephritis  4. Other cirrhosis of liver THIS IS COPIED [place]:-" Suggested on CT abdom - acute viral hepatitis panel unrevealing - ultrasound suggests cirrhotic architecture of liver - son denies use of alcohol or previous history of liver dysfunction/cirrhosis - LFTs are normal though protein is low " - Coags basically WNL "  5. Diabetes type 2, uncontrolled  6. Severe sepsis  7. UTI (lower urinary tract infection)  8. HLD (hyperlipidemia) Liver function tests were normal throughout the hospitalization. Will go ahead and restart pravastatin 20 mg. CT abdomen showed "possible cirrhosis. Correlate clinically. " Ultrasound abdomen showed "hepatic cirrhosis." THE FOLLOWING IS COPIED [place]: -Suggested on CT abdom - acute viral hepatitis panel unrevealing - ultrasound suggests cirrhotic architecture of liver - son denies use of alcohol or previous history of liver dysfunction/cirrhosis - LFTs are normal though protein is low  - Coags basically WNL    She is to: Decrease Norvasc back to 5 mg daily. Restart lisinopril 10 mg daily. Restart  pravastatin 20 mg daily. Take the Levaquin and start immediately.  Schedule follow-up office visit here in one week. (So we can make sure she is taking medications as directed above and repeat lab work.--CMET)    Signed, [address] [place], [place], [facility] 10/04/2014 12:07 PM

## 2014-10-11 ENCOUNTER — Ambulatory Visit (INDEPENDENT_AMBULATORY_CARE_PROVIDER_SITE_OTHER): Payer: Medicaid Other | Admitting: Family Medicine

## 2014-10-11 ENCOUNTER — Encounter: Payer: Self-pay | Admitting: Family Medicine

## 2014-10-11 VITALS — BP 130/68 | HR 72 | Temp 98.1°F | Resp 16 | Ht <= 58 in | Wt 147.0 lb

## 2014-10-11 DIAGNOSIS — IMO0002 Reserved for concepts with insufficient information to code with codable children: Secondary | ICD-10-CM

## 2014-10-11 DIAGNOSIS — E1165 Type 2 diabetes mellitus with hyperglycemia: Secondary | ICD-10-CM

## 2014-10-11 DIAGNOSIS — I10 Essential (primary) hypertension: Secondary | ICD-10-CM | POA: Diagnosis not present

## 2014-10-11 DIAGNOSIS — M5136 Other intervertebral disc degeneration, lumbar region: Secondary | ICD-10-CM

## 2014-10-11 DIAGNOSIS — N10 Acute tubulo-interstitial nephritis: Secondary | ICD-10-CM | POA: Diagnosis not present

## 2014-10-11 DIAGNOSIS — Z8781 Personal history of (healed) traumatic fracture: Secondary | ICD-10-CM | POA: Diagnosis not present

## 2014-10-11 DIAGNOSIS — K7469 Other cirrhosis of liver: Secondary | ICD-10-CM

## 2014-10-11 DIAGNOSIS — E871 Hypo-osmolality and hyponatremia: Secondary | ICD-10-CM

## 2014-10-11 DIAGNOSIS — M51369 Other intervertebral disc degeneration, lumbar region without mention of lumbar back pain or lower extremity pain: Secondary | ICD-10-CM | POA: Insufficient documentation

## 2014-10-11 LAB — CBC WITH DIFFERENTIAL/PLATELET
BASOS PCT: 1 % (ref 0–1)
Basophils Absolute: 0 10*3/uL (ref 0.0–0.1)
EOS ABS: 0.1 10*3/uL (ref 0.0–0.7)
EOS PCT: 2 % (ref 0–5)
HCT: 39.1 % (ref 36.0–46.0)
Hemoglobin: 12.4 g/dL (ref 12.0–15.0)
Lymphocytes Relative: 37 % (ref 12–46)
Lymphs Abs: 1.8 10*3/uL (ref 0.7–4.0)
MCH: 26.4 pg (ref 26.0–34.0)
MCHC: 31.7 g/dL (ref 30.0–36.0)
MCV: 83.2 fL (ref 78.0–100.0)
MONO ABS: 0.4 10*3/uL (ref 0.1–1.0)
MONOS PCT: 9 % (ref 3–12)
MPV: 9.8 fL (ref 8.6–12.4)
Neutro Abs: 2.5 10*3/uL (ref 1.7–7.7)
Neutrophils Relative %: 51 % (ref 43–77)
Platelets: 181 10*3/uL (ref 150–400)
RBC: 4.7 MIL/uL (ref 3.87–5.11)
RDW: 15.1 % (ref 11.5–15.5)
WBC: 4.9 10*3/uL (ref 4.0–10.5)

## 2014-10-11 LAB — COMPREHENSIVE METABOLIC PANEL
ALBUMIN: 3.4 g/dL — AB (ref 3.6–5.1)
ALT: 11 U/L (ref 6–29)
AST: 21 U/L (ref 10–35)
Alkaline Phosphatase: 73 U/L (ref 33–130)
BUN: 23 mg/dL (ref 7–25)
CO2: 24 mmol/L (ref 20–31)
CREATININE: 1.08 mg/dL — AB (ref 0.60–0.93)
Calcium: 9.4 mg/dL (ref 8.6–10.4)
Chloride: 101 mmol/L (ref 98–110)
GLUCOSE: 115 mg/dL — AB (ref 70–99)
Potassium: 4.9 mmol/L (ref 3.5–5.3)
SODIUM: 134 mmol/L — AB (ref 135–146)
Total Bilirubin: 0.4 mg/dL (ref 0.2–1.2)
Total Protein: 6.4 g/dL (ref 6.1–8.1)

## 2014-10-11 MED ORDER — GABAPENTIN 300 MG PO CAPS: 300.0000 mg | ORAL_CAPSULE | Freq: Every day | ORAL | Status: DC

## 2014-10-11 MED ORDER — PRAVASTATIN SODIUM 20 MG PO TABS: 20.0000 mg | ORAL_TABLET | Freq: Every day | ORAL | Status: DC

## 2014-10-11 MED ORDER — METOPROLOL TARTRATE 25 MG PO TABS: 25.0000 mg | ORAL_TABLET | Freq: Two times a day (BID) | ORAL | Status: DC

## 2014-10-11 NOTE — Patient Instructions (Signed)
We will refer to neurosurgery  Continue current medications Blood pressure improved F/U 1 month - Bring your glucose meter

## 2014-10-11 NOTE — Progress Notes (Signed)
Patient ID: Joyce Gilmore, female   DOB: March 10, 1934, 80 y.o.   MRN: [id number]   Subjective:    Patient ID: Joyce Gilmore, female    DOB: 31-Dec-1934, 80 y.o.   MRN: [id number]  Patient presents for 1 week F/U  patient here for interim follow-up. She was admitted secondary to urosepsis she had hyponatremia as well as a CT scan concerning for possible cirrhosis with normal liver enzymes and no evidence of hypercoagulability. She now has her Levaquin has 3 days left she is here today with her daughter. Her blood pressure medications were also adjusted and she is now taking all the correct medications. Before her diabetes was well controlled on Lantus 35 units and 6 units of NovoLog with lunch as she will decline taken insulin more than twice a day. It seems every time she goes to [place] she comes back very ill as well as missing medication doses. She's here today to follow-up her medications as well as repeat metabolic panel. She has a writing down her blood sugars however is very difficult to discern the time a day as they are all written as a continuous line. She states her blood sugar this morning was 104. She denies any hypoglycemia symptoms. She is injecting LANTUS 35 units and NovoLog 3 units with each meal.  Her daughter and is with her today had a lot of concern about her mother's chronic back pain for the note she has had back surgery she also has had hip surgery with arthritis of the hips as well as the spine she had a compression fracture back in 2015. She is on gabapentin she also takes oxycodone as needed but states that this does not help very well. They want to be referred to a spine specialist. I did let her know that she was referred to a specialist last year however seems that she took trips to [place] in did not follow through with the appointments.  Appetite is much improved  Review Of Systems:  GEN- denies fatigue, fever, weight loss,weakness, recent illness HEENT- denies eye drainage,  change in vision, nasal discharge, CVS- denies chest pain, palpitations RESP- denies SOB, cough, wheeze ABD- denies N/V, change in stools, abd pain GU- denies dysuria, hematuria, dribbling, incontinence MSK- + joint pain, muscle aches, injury Neuro- denies headache, dizziness, syncope, seizure activity       Objective:    BP 130/68 mmHg  Pulse 72  Temp(Src) 98.1 F (36.7 C) (Oral)  Resp 16  Ht 4\' 8"  (1.422 m)  Wt 147 lb (66.679 kg)  BMI 32.98 kg/m2 GEN- NAD, alert and oriented x3,weight gain HEENT- PERRL, EOMI, non injected sclera, pink conjunctiva, MMM, oropharynx clear CVS- RRR, no murmur RESP-CTAB ABD-NABS,soft,NT,ND, no CVA tenderness MSK- TTP thoracic spine, decreased ROM entire spine, neg SLR EXT- No edema Pulses- Radial, DP- 2+        Assessment & Plan:      Problem List Items Addressed This Visit    Hyponatremia - Primary   Relevant Orders   Comprehensive metabolic panel   Hepatic cirrhosis   H/O compression fracture of spine   Relevant Orders   Ambulatory referral to Orthopedic Surgery   Essential hypertension    Well controlled, continue current meds      Relevant Orders   CBC with Differential/Platelet   Diabetes type 2, uncontrolled    Time spent discussing medication regimen with patient and her daughter. She will continue the Lantus 35 units and we will see if  she will truly inject the NovoLog 3 times a day with meals      DDD (degenerative disc disease), lumbar   Relevant Orders   Ambulatory referral to Orthopedic Surgery   Acute pyelonephritis    Complete treatment with Levaquin she has 3 days left her symptoms are now resolved         Note: This dictation was prepared with Dragon dictation along with smaller phrase technology. Any transcriptional errors that result from this process are unintentional.

## 2014-10-11 NOTE — Assessment & Plan Note (Signed)
Time spent discussing medication regimen with patient and her daughter. She will continue the Lantus 35 units and we will see if she will truly inject the NovoLog 3 times a day with meals

## 2014-10-11 NOTE — Assessment & Plan Note (Signed)
Complete treatment with Levaquin she has 3 days left her symptoms are now resolved

## 2014-10-11 NOTE — Assessment & Plan Note (Signed)
Arthritis in her spine as well as degenerative changes she has had a compression fracture due to osteoporosis. I discussed with her daughter I do not think there is much second be done due to her age but they will like to see a spine specialist anyway we will go ahead and get this arranged again.

## 2014-10-11 NOTE — Addendum Note (Signed)
Addended by: Vic Blackbird F on: 10/11/2014 02:10 PM   Modules accepted: Orders

## 2014-10-11 NOTE — Assessment & Plan Note (Signed)
- 

## 2014-11-20 ENCOUNTER — Ambulatory Visit (INDEPENDENT_AMBULATORY_CARE_PROVIDER_SITE_OTHER): Payer: Medicaid Other | Admitting: Family Medicine

## 2014-11-20 ENCOUNTER — Encounter: Payer: Self-pay | Admitting: Family Medicine

## 2014-11-20 VITALS — BP 139/70 | HR 76 | Temp 98.2°F | Resp 14 | Ht <= 58 in | Wt 148.0 lb

## 2014-11-20 DIAGNOSIS — I10 Essential (primary) hypertension: Secondary | ICD-10-CM | POA: Diagnosis not present

## 2014-11-20 DIAGNOSIS — J302 Other seasonal allergic rhinitis: Secondary | ICD-10-CM | POA: Diagnosis not present

## 2014-11-20 DIAGNOSIS — E119 Type 2 diabetes mellitus without complications: Secondary | ICD-10-CM

## 2014-11-20 DIAGNOSIS — M5136 Other intervertebral disc degeneration, lumbar region: Secondary | ICD-10-CM | POA: Diagnosis not present

## 2014-11-20 DIAGNOSIS — E785 Hyperlipidemia, unspecified: Secondary | ICD-10-CM

## 2014-11-20 DIAGNOSIS — Z23 Encounter for immunization: Secondary | ICD-10-CM | POA: Diagnosis not present

## 2014-11-20 LAB — LIPID PANEL
Cholesterol: 134 mg/dL (ref 125–200)
HDL: 65 mg/dL (ref >=46)
LDL CALC: 40 mg/dL (ref <130)
TRIGLYCERIDES: 144 mg/dL (ref <150)
Total CHOL/HDL Ratio: 2.1 Ratio (ref <=5.0)
VLDL: 29 mg/dL (ref <30)

## 2014-11-20 LAB — COMPREHENSIVE METABOLIC PANEL
ALT: 20 U/L (ref 6–29)
AST: 20 U/L (ref 10–35)
Albumin: 4.2 g/dL (ref 3.6–5.1)
Alkaline Phosphatase: 89 U/L (ref 33–130)
BUN: 28 mg/dL — AB (ref 7–25)
CO2: 23 mmol/L (ref 20–31)
CREATININE: 0.95 mg/dL — AB (ref 0.60–0.93)
Calcium: 9.7 mg/dL (ref 8.6–10.4)
Chloride: 105 mmol/L (ref 98–110)
Glucose, Bld: 105 mg/dL — ABNORMAL HIGH (ref 70–99)
POTASSIUM: 3.8 mmol/L (ref 3.5–5.3)
SODIUM: 137 mmol/L (ref 135–146)
Total Bilirubin: 0.4 mg/dL (ref 0.2–1.2)
Total Protein: 6.9 g/dL (ref 6.1–8.1)

## 2014-11-20 MED ORDER — INSULIN GLARGINE 100 UNIT/ML SOLOSTAR PEN: 36.0000 [IU] | PEN_INJECTOR | Freq: Every day | SUBCUTANEOUS | Status: DC

## 2014-11-20 MED ORDER — AMLODIPINE BESYLATE 5 MG PO TABS: 5.0000 mg | ORAL_TABLET | Freq: Every day | ORAL | Status: DC

## 2014-11-20 MED ORDER — METOPROLOL TARTRATE 25 MG PO TABS: 25.0000 mg | ORAL_TABLET | Freq: Two times a day (BID) | ORAL | Status: DC

## 2014-11-20 MED ORDER — SUCRALFATE 1 G PO TABS: 1.0000 g | ORAL_TABLET | Freq: Four times a day (QID) | ORAL | Status: DC

## 2014-11-20 MED ORDER — LANSOPRAZOLE 30 MG PO CPDR: 30.0000 mg | DELAYED_RELEASE_CAPSULE | Freq: Two times a day (BID) | ORAL | Status: DC

## 2014-11-20 MED ORDER — LISINOPRIL 10 MG PO TABS: 10.0000 mg | ORAL_TABLET | Freq: Every day | ORAL | Status: DC

## 2014-11-20 MED ORDER — PRAVASTATIN SODIUM 20 MG PO TABS: 20.0000 mg | ORAL_TABLET | Freq: Every day | ORAL | Status: DC

## 2014-11-20 NOTE — Patient Instructions (Signed)
Use nasal spray 1 -spray each side of nose as needed Cough drops We will call with lab results Flu shot given F/U 2 months

## 2014-11-20 NOTE — Progress Notes (Signed)
Patient ID: Joyce Gilmore, female   DOB: 12-24-1934, 80 y.o.   MRN: [id number]   Subjective:    Patient ID: Joyce Gilmore, female    DOB: 1934/12/17, 80 y.o.   MRN: [id number]  Patient presents for 1 month F/U  patient here for interim follow-up. She was seen 4 weeks ago after hospitalization. She is here today with her daughter as well as the interpreter. She states that she is doing well she's only had some dry cough with a mild dry throat for the past 2 days. She denies any fever nothing is productive. She has not taken anything over-the-counter.  She was seen by the spine surgeon she was given meloxicam and methocarbamol which she has not used very often. She is scheduled to have an MRI on the 29th.  Diabetes mellitus she has been injecting Lantus 36 units and NovoLog 3 units with each meal her blood sugars today show a 14 day average of 170 30 day average 145 no she was also given prednisone 5 a spine specialist and her blood sugars went up to 200s when she was on this medication.      Review Of Systems:  GEN- denies fatigue, fever, weight loss,weakness, recent illness HEENT- denies eye drainage, change in vision, nasal discharge, CVS- denies chest pain, palpitations RESP- denies SOB, cough, wheeze ABD- denies N/V, change in stools, abd pain GU- denies dysuria, hematuria, dribbling, incontinence MSK- denies joint pain, muscle aches, injury Neuro- denies headache, dizziness, syncope, seizure activity       Objective:    BP 139/70 mmHg  Pulse 76  Temp(Src) 98.2 F (36.8 C) (Oral)  Resp 14  Ht 4\' 8"  (1.422 m)  Wt 148 lb (67.132 kg)  BMI 33.20 kg/m2 GEN- NAD, alert and oriented x3 HEENT- PERRL, EOMI, non injected sclera, pink conjunctiva, MMM, oropharynx clear, nares clear rhinorrhea, enlarged turbinates R >L, TM clear bilat no effusion  Neck- Supple, no LAD CVS- RRR, no murmur RESP-CTAB EXT- No edema Pulses- Radial - 2+        Assessment & Plan:      Problem  List Items Addressed This Visit    HLD (hyperlipidemia)   Relevant Medications   pravastatin (PRAVACHOL) 20 MG tablet   metoprolol tartrate (LOPRESSOR) 25 MG tablet   lisinopril (PRINIVIL,ZESTRIL) 10 MG tablet   amLODipine (NORVASC) 5 MG tablet   Other Relevant Orders   Lipid panel   Essential hypertension - Primary   Relevant Medications   pravastatin (PRAVACHOL) 20 MG tablet   metoprolol tartrate (LOPRESSOR) 25 MG tablet   lisinopril (PRINIVIL,ZESTRIL) 10 MG tablet   amLODipine (NORVASC) 5 MG tablet   Other Relevant Orders   Comprehensive metabolic panel   Diabetes mellitus type II, controlled   Relevant Medications   pravastatin (PRAVACHOL) 20 MG tablet   lisinopril (PRINIVIL,ZESTRIL) 10 MG tablet   Insulin Glargine (LANTUS SOLOSTAR) 100 UNIT/ML Solostar Pen   DDD (degenerative disc disease), lumbar   Relevant Medications   meloxicam (MOBIC) 7.5 MG tablet   methocarbamol (ROBAXIN) 500 MG tablet   methylPREDNISolone (MEDROL DOSEPAK) 4 MG TBPK tablet    Other Visit Diagnoses    Seasonal allergies        Need for prophylactic vaccination and inoculation against influenza        Relevant Orders    Flu Vaccine QUAD 36+ mos PF IM (Fluarix & Fluzone Quad PF) (Completed)       Note: This dictation was prepared with Dragon dictation along  with smaller phrase technology. Any transcriptional errors that result from this process are unintentional.

## 2014-11-21 ENCOUNTER — Encounter: Payer: Self-pay | Admitting: Family Medicine

## 2014-11-21 DIAGNOSIS — J302 Other seasonal allergic rhinitis: Secondary | ICD-10-CM | POA: Insufficient documentation

## 2014-11-21 NOTE — Assessment & Plan Note (Signed)
I think her cough and sore throat from some postnasal drip she also has enlarged turbinates I given her a sample of nasal steroid-Qnasal from the office.

## 2014-11-21 NOTE — Assessment & Plan Note (Signed)
Blood pressure continues to look good and then keep her on the same dose of medication

## 2014-11-21 NOTE — Assessment & Plan Note (Signed)
Diabetes is shown improvement as she is taking her Lantus and NovoLog on a regular basis. We will recheck her A1c the next visit in 2 months

## 2014-11-29 ENCOUNTER — Other Ambulatory Visit: Payer: Self-pay | Admitting: Physician Assistant

## 2014-11-29 NOTE — Telephone Encounter (Signed)
Refill appropriate and filled per protocol. 

## 2014-12-05 ENCOUNTER — Ambulatory Visit (INDEPENDENT_AMBULATORY_CARE_PROVIDER_SITE_OTHER): Payer: Medicaid Other | Admitting: Family Medicine

## 2014-12-05 ENCOUNTER — Encounter: Payer: Self-pay | Admitting: Family Medicine

## 2014-12-05 VITALS — BP 132/68 | HR 62 | Temp 98.2°F | Resp 16 | Ht <= 58 in | Wt 151.0 lb

## 2014-12-05 DIAGNOSIS — J209 Acute bronchitis, unspecified: Secondary | ICD-10-CM | POA: Diagnosis not present

## 2014-12-05 MED ORDER — AZITHROMYCIN 250 MG PO TABS
ORAL_TABLET | ORAL | Status: DC
Start: 2014-12-05 — End: 2015-01-23

## 2014-12-05 MED ORDER — BENZONATATE 100 MG PO CAPS: 100.0000 mg | ORAL_CAPSULE | Freq: Three times a day (TID) | ORAL | Status: DC | PRN

## 2014-12-05 MED ORDER — METHYLPREDNISOLONE ACETATE 40 MG/ML IJ SUSP
40.0000 mg | Freq: Once | INTRAMUSCULAR | Status: AC
  Administered 2014-12-05: 40 mg via INTRAMUSCULAR

## 2014-12-05 NOTE — Progress Notes (Signed)
Patient ID: Joyce Gilmore, female   DOB: 1934-07-17, 80 y.o.   MRN: [id number]   Subjective:    Patient ID: Joyce Gilmore, female    DOB: 09-13-1934, 80 y.o.   MRN: [id number]  Patient presents for Illness  patient here with her granddaughter who is the interpreter. Her granddaughter is an adult. She's had cough for the past 2 weeks with  Thick production and a little chest tightness. She has not noticed any wheezing. The cough keeps her up at night. She's been trying warm teas but this has not helped. She has not had any fever no chills does not feel very sick. Her blood sugars have been good this morning 144 fasting    Review Of Systems: per above   GEN- denies fatigue, fever, weight loss,weakness, recent illness HEENT- denies eye drainage, change in vision, nasal discharge, CVS- denies chest pain, palpitations RESP- denies SOB, +cough, wheeze ABD- denies N/V, change in stools, abd pain Neuro- denies headache, dizziness, syncope, seizure activity       Objective:    BP 132/68 mmHg  Pulse 62  Temp(Src) 98.2 F (36.8 C) (Oral)  Resp 16  Ht 4\' 8"  (1.422 m)  Wt 151 lb (68.493 kg)  BMI 33.87 kg/m2  SpO2 98% GEN- NAD, alert and oriented x3, non toxic appearing  HEENT- PERRL, EOMI, non injected sclera, pink conjunctiva, MMM, oropharynx clear Neck- Supple, no LAD CVS- RRR, no murmur RESP-course BS bilat, congestion Right side, some clear with cough, normal WOB, no retractions  EXT- No edema Pulses- Radial 2+        Assessment & Plan:      Problem List Items Addressed This Visit    None    Visit Diagnoses    Acute bronchitis, unspecified organism    -  Primary    Given Depo Medrol, Z pak, tessalon perrles, CXR if she still does not improve. Humidifer at bedtime    Relevant Medications    methylPREDNISolone acetate (DEPO-MEDROL) injection 40 mg (Completed)       Note: This dictation was prepared with Dragon dictation along with smaller phrase technology. Any  transcriptional errors that result from this process are unintentional.

## 2014-12-05 NOTE — Patient Instructions (Signed)
Take antibiotics as prescribed  Steroid shot given  Tessalon perrles given  F/U as previous

## 2015-01-15 ENCOUNTER — Other Ambulatory Visit: Payer: Self-pay | Admitting: Family Medicine

## 2015-01-15 NOTE — Telephone Encounter (Signed)
Refill appropriate and filled per protocol. 

## 2015-01-22 ENCOUNTER — Ambulatory Visit: Payer: Medicaid Other | Admitting: Family Medicine

## 2015-01-23 ENCOUNTER — Encounter: Payer: Self-pay | Admitting: Family Medicine

## 2015-01-23 ENCOUNTER — Ambulatory Visit (INDEPENDENT_AMBULATORY_CARE_PROVIDER_SITE_OTHER): Payer: Medicaid Other | Admitting: Family Medicine

## 2015-01-23 ENCOUNTER — Ambulatory Visit: Payer: Medicaid Other | Admitting: Family Medicine

## 2015-01-23 VITALS — BP 128/78 | HR 64 | Temp 97.8°F | Resp 16 | Ht <= 58 in | Wt 154.0 lb

## 2015-01-23 DIAGNOSIS — E119 Type 2 diabetes mellitus without complications: Secondary | ICD-10-CM | POA: Diagnosis not present

## 2015-01-23 DIAGNOSIS — F4321 Adjustment disorder with depressed mood: Secondary | ICD-10-CM | POA: Diagnosis not present

## 2015-01-23 DIAGNOSIS — Z794 Long term (current) use of insulin: Secondary | ICD-10-CM

## 2015-01-23 DIAGNOSIS — R05 Cough: Secondary | ICD-10-CM | POA: Diagnosis not present

## 2015-01-23 DIAGNOSIS — R059 Cough, unspecified: Secondary | ICD-10-CM

## 2015-01-23 MED ORDER — INSULIN ASPART 100 UNIT/ML FLEXPEN: PEN_INJECTOR | SUBCUTANEOUS | Status: DC

## 2015-01-23 MED ORDER — BENZONATATE 100 MG PO CAPS: 100.0000 mg | ORAL_CAPSULE | Freq: Three times a day (TID) | ORAL | Status: DC | PRN

## 2015-01-23 MED ORDER — MELOXICAM 7.5 MG PO TABS: 7.5000 mg | ORAL_TABLET | Freq: Every day | ORAL | Status: DC

## 2015-01-23 MED ORDER — SUCRALFATE 1 G PO TABS: 1.0000 g | ORAL_TABLET | Freq: Four times a day (QID) | ORAL | Status: DC

## 2015-01-23 MED ORDER — AMLODIPINE BESYLATE 5 MG PO TABS: 5.0000 mg | ORAL_TABLET | Freq: Every day | ORAL | Status: DC

## 2015-01-23 MED ORDER — METOPROLOL TARTRATE 25 MG PO TABS: 25.0000 mg | ORAL_TABLET | Freq: Two times a day (BID) | ORAL | Status: DC

## 2015-01-23 MED ORDER — TRAZODONE HCL 50 MG PO TABS: 25.0000 mg | ORAL_TABLET | Freq: Every evening | ORAL | Status: DC | PRN

## 2015-01-23 MED ORDER — INSULIN GLARGINE 100 UNIT/ML SOLOSTAR PEN: 36.0000 [IU] | PEN_INJECTOR | Freq: Every day | SUBCUTANEOUS | Status: DC

## 2015-01-23 MED ORDER — LISINOPRIL 10 MG PO TABS: 10.0000 mg | ORAL_TABLET | Freq: Every day | ORAL | Status: DC

## 2015-01-23 MED ORDER — PRAVASTATIN SODIUM 20 MG PO TABS: 20.0000 mg | ORAL_TABLET | Freq: Every day | ORAL | Status: DC

## 2015-01-23 MED ORDER — LANSOPRAZOLE 30 MG PO CPDR: 30.0000 mg | DELAYED_RELEASE_CAPSULE | Freq: Two times a day (BID) | ORAL | Status: DC

## 2015-01-23 NOTE — Assessment & Plan Note (Signed)
Recheck A1C goal 7% Continue lantus, given 1 box of lantus and Novolog from office

## 2015-01-23 NOTE — Patient Instructions (Signed)
Take the cough pills as needed Take 1/2 tablet of trazodone at bedtime for sleep and nerves Continue current insulin F/U March for diabetes

## 2015-01-23 NOTE — Progress Notes (Signed)
Patient ID: Joyce Gilmore, female   DOB: 04/13/1934, 80 y.o.   MRN: [id number]   Subjective:    Patient ID: Joyce Gilmore, female    DOB: September 01, 1934, 80 y.o.   MRN: [id number]  Patient presents for 2 month F/U and Cough   patient here to follow-up. She is here for diabetes mellitus. She is taking Lantus 36 units and 3 units and NovoLog. She is here today with her niece who is the interpreter. She has no particular concerns. Her blood sugars range in the morning from 90-120 after meals 180-200. She's not had any hypoglycemia symptoms. (Reviewed meter 30 day average 130)  Return visit she became very tearful. She's been having difficulty sleeping since her son passed away which was about 6 weeks ago. She states that her nerves are just bad. She is actually planning to go to [place] for the next 6 weeks 1 something to help with her nerves as well as sleeping.  She was treated for bronchitis at her last visit back in October. Her cough is much improved but occasionally she will just get coughing fits. She's not had any shortness of breath no chest pain associated.    Review Of Systems:  GEN- denies fatigue, fever, weight loss,weakness, recent illness HEENT- denies eye drainage, change in vision, nasal discharge, CVS- denies chest pain, palpitations RESP- denies SOB, +cough, wheeze ABD- denies N/V, change in stools, abd pain GU- denies dysuria, hematuria, dribbling, incontinence MSK- denies joint pain, muscle aches, injury Neuro- denies headache, dizziness, syncope, seizure activity       Objective:    BP 128/78 mmHg  Pulse 64  Temp(Src) 97.8 F (36.6 C) (Oral)  Resp 16  Ht 4\' 8"  (1.422 m)  Wt 154 lb (69.854 kg)  BMI 34.55 kg/m2 GEN- NAD, alert and oriented x3 HEENT- PERRL, EOMI, non injected sclera, pink conjunctiva, MMM, oropharynx clear Neck- Supple, no thyromegaly CVS- RRR, no murmur RESP-CTAB EXT- No edema Pulses- Radial - 2+        Assessment & Plan:      Problem  List Items Addressed This Visit    Diabetes mellitus type II, controlled ([place]) - Primary   Relevant Medications   pravastatin (PRAVACHOL) 20 MG tablet   insulin aspart (NOVOLOG FLEXPEN) 100 UNIT/ML FlexPen   lisinopril (PRINIVIL,ZESTRIL) 10 MG tablet   Insulin Glargine (LANTUS SOLOSTAR) 100 UNIT/ML Solostar Pen   Other Relevant Orders   CBC with Differential/Platelet   Comprehensive metabolic panel   Hemoglobin A1c    Other Visit Diagnoses    Grief reaction        Trial of trazodone 25mg  at bedtime as well for sleep and mood    Cough        Lung exam normal, recent bronchitis, likley post bronchitic cough, refilled tessalon       Note: This dictation was prepared with Dragon dictation along with smaller Company secretary. Any transcriptional errors that result from this process are unintentional.

## 2015-01-24 ENCOUNTER — Telehealth: Payer: Self-pay | Admitting: *Deleted

## 2015-01-24 ENCOUNTER — Other Ambulatory Visit: Payer: Self-pay | Admitting: *Deleted

## 2015-01-24 DIAGNOSIS — E871 Hypo-osmolality and hyponatremia: Secondary | ICD-10-CM

## 2015-01-24 LAB — CBC WITH DIFFERENTIAL/PLATELET
Basophils Absolute: 0.1 10*3/uL (ref 0.0–0.1)
Basophils Relative: 1 % (ref 0–1)
Eosinophils Absolute: 0.2 10*3/uL (ref 0.0–0.7)
Eosinophils Relative: 2 % (ref 0–5)
HEMATOCRIT: 40.3 % (ref 36.0–46.0)
HEMOGLOBIN: 13 g/dL (ref 12.0–15.0)
LYMPHS ABS: 2.3 10*3/uL (ref 0.7–4.0)
LYMPHS PCT: 30 % (ref 12–46)
MCH: 27.8 pg (ref 26.0–34.0)
MCHC: 32.3 g/dL (ref 30.0–36.0)
MCV: 86.1 fL (ref 78.0–100.0)
MONOS PCT: 7 % (ref 3–12)
MPV: 10.2 fL (ref 8.6–12.4)
Monocytes Absolute: 0.5 10*3/uL (ref 0.1–1.0)
NEUTROS ABS: 4.7 10*3/uL (ref 1.7–7.7)
NEUTROS PCT: 60 % (ref 43–77)
Platelets: 208 10*3/uL (ref 150–400)
RBC: 4.68 MIL/uL (ref 3.87–5.11)
RDW: 14.8 % (ref 11.5–15.5)
WBC: 7.8 10*3/uL (ref 4.0–10.5)

## 2015-01-24 LAB — COMPREHENSIVE METABOLIC PANEL
ALBUMIN: 4 g/dL (ref 3.6–5.1)
ALT: 14 U/L (ref 6–29)
AST: 18 U/L (ref 10–35)
Alkaline Phosphatase: 96 U/L (ref 33–130)
BUN: 21 mg/dL (ref 7–25)
CHLORIDE: 103 mmol/L (ref 98–110)
CO2: 21 mmol/L (ref 20–31)
Calcium: 9.3 mg/dL (ref 8.6–10.4)
Creat: 1 mg/dL — ABNORMAL HIGH (ref 0.60–0.93)
Glucose, Bld: 148 mg/dL — ABNORMAL HIGH (ref 70–99)
POTASSIUM: 4.7 mmol/L (ref 3.5–5.3)
Sodium: 132 mmol/L — ABNORMAL LOW (ref 135–146)
TOTAL PROTEIN: 6.7 g/dL (ref 6.1–8.1)
Total Bilirubin: 0.3 mg/dL (ref 0.2–1.2)

## 2015-01-24 LAB — HEMOGLOBIN A1C
HEMOGLOBIN A1C: 7.4 % — AB (ref <5.7)
MEAN PLASMA GLUCOSE: 166 mg/dL — AB (ref <117)

## 2015-01-24 NOTE — Telephone Encounter (Signed)
Received request from pharmacy for PA on lansoprazole.   Patient has tried and failed omeprazole, pantoprazole, famotidine and metoclopramide.   PA submitted.   Dx: K21.9- GERD  PA approved 01/24/2015- 01/23/2016.  Ref# [id number].  Pharmacy made aware.

## 2015-01-29 ENCOUNTER — Other Ambulatory Visit: Payer: Medicaid Other

## 2015-01-29 DIAGNOSIS — E871 Hypo-osmolality and hyponatremia: Secondary | ICD-10-CM

## 2015-01-29 LAB — BASIC METABOLIC PANEL
BUN: 19 mg/dL (ref 7–25)
CALCIUM: 9.2 mg/dL (ref 8.6–10.4)
CHLORIDE: 104 mmol/L (ref 98–110)
CO2: 17 mmol/L — AB (ref 20–31)
CREATININE: 1.1 mg/dL — AB (ref 0.60–0.93)
Glucose, Bld: 123 mg/dL — ABNORMAL HIGH (ref 70–99)
Potassium: 4.9 mmol/L (ref 3.5–5.3)
Sodium: 133 mmol/L — ABNORMAL LOW (ref 135–146)

## 2015-02-06 ENCOUNTER — Ambulatory Visit: Payer: Medicaid Other | Admitting: Family Medicine

## 2015-02-07 ENCOUNTER — Encounter: Payer: Self-pay | Admitting: Family Medicine

## 2015-02-07 ENCOUNTER — Ambulatory Visit (INDEPENDENT_AMBULATORY_CARE_PROVIDER_SITE_OTHER): Payer: Medicaid Other | Admitting: Family Medicine

## 2015-02-07 VITALS — BP 130/78 | HR 80 | Temp 98.3°F | Resp 16 | Ht <= 58 in | Wt 156.0 lb

## 2015-02-07 DIAGNOSIS — J209 Acute bronchitis, unspecified: Secondary | ICD-10-CM

## 2015-02-07 MED ORDER — ALBUTEROL SULFATE HFA 108 (90 BASE) MCG/ACT IN AERS: 2.0000 | INHALATION_SPRAY | Freq: Four times a day (QID) | RESPIRATORY_TRACT | Status: DC | PRN

## 2015-02-07 MED ORDER — ALBUTEROL SULFATE (2.5 MG/3ML) 0.083% IN NEBU
2.5000 mg | INHALATION_SOLUTION | Freq: Once | RESPIRATORY_TRACT | Status: AC
  Administered 2015-02-07: 2.5 mg via RESPIRATORY_TRACT

## 2015-02-07 MED ORDER — METHYLPREDNISOLONE ACETATE 40 MG/ML IJ SUSP
40.0000 mg | Freq: Once | INTRAMUSCULAR | Status: AC
  Administered 2015-02-07: 40 mg via INTRAMUSCULAR

## 2015-02-07 MED ORDER — LEVOFLOXACIN 500 MG PO TABS: 500.0000 mg | ORAL_TABLET | Freq: Every day | ORAL | Status: DC

## 2015-02-07 MED ORDER — PREDNISONE 20 MG PO TABS: 40.0000 mg | ORAL_TABLET | Freq: Every day | ORAL | Status: DC

## 2015-02-07 MED ORDER — BENZONATATE 100 MG PO CAPS: 100.0000 mg | ORAL_CAPSULE | Freq: Three times a day (TID) | ORAL | Status: DC | PRN

## 2015-02-07 NOTE — Patient Instructions (Signed)
Take antibiotics Use inhaler for wheezing and shortness of breath Take prednisone pills starting tomorrow Continue all other medication

## 2015-02-07 NOTE — Progress Notes (Signed)
Patient ID: Joyce Gilmore, female   DOB: April 16, 1934, 80 y.o.   MRN: [id number]   Subjective:    Patient ID: Joyce Gilmore, female    DOB: 19-Mar-1934, 80 y.o.   MRN: [id number]  Patient presents for Illness  patient here with cough with production rattling in her chest for the past few days. She's not had any fever but has felt a little short of breath. She also has sinus pressure and headache. She has been using the [employer] for cough. She is supposed to be flying out today to return to [place] for the next couple months. She was treated for bronchitis about a month ago. At that time I gave her azithromycin.    Review Of Systems:  GEN- denies fatigue, fever, weight loss,weakness, recent illness HEENT- denies eye drainage, change in vision, nasal discharge, CVS- denies chest pain, palpitations RESP- denies SOB, +cough, +wheeze ABD- denies N/V, change in stools, abd pain Neuro- denies headache, dizziness, syncope, seizure activity       Objective:    BP 130/78 mmHg  Pulse 80  Temp(Src) 98.3 F (36.8 C) (Oral)  Resp 16  Ht 4\' 8"  (1.422 m)  Wt 156 lb (70.761 kg)  BMI 34.99 kg/m2  SpO2 96% GEN- NAD, alert and oriented x3 HEENT- PERRL, EOMI, non injected sclera, pink conjunctiva, MMM, oropharynx clear,clear rhinorrhea Neck- Supple, no LAD CVS- RRR, no murmur RESP-no retractions, mild congestion, wheezing bilat, good air movement EXT- No edema Pulses- Radial, 2+        Assessment & Plan:      Problem List Items Addressed This Visit    None    Visit Diagnoses    Acute bronchitis, unspecified organism    -  Primary    Given neb in office, improved wheeze, depo medrol IM, will sendwith prednisone, albuterol inhaler, levaquin, discussed need for urgent ER visit if breathing worsens       Note: This dictation was prepared with Dragon dictation along with smaller phrase technology. Any transcriptional errors that result from this process are unintentional.

## 2015-04-13 ENCOUNTER — Other Ambulatory Visit: Payer: Self-pay | Admitting: Family Medicine

## 2015-04-13 NOTE — Telephone Encounter (Signed)
Refill appropriate and filled per protocol. 

## 2015-04-23 ENCOUNTER — Other Ambulatory Visit: Payer: Self-pay

## 2015-04-23 DIAGNOSIS — Z1231 Encounter for screening mammogram for malignant neoplasm of breast: Secondary | ICD-10-CM

## 2015-05-09 ENCOUNTER — Ambulatory Visit (INDEPENDENT_AMBULATORY_CARE_PROVIDER_SITE_OTHER): Payer: Medicaid Other | Admitting: Family Medicine

## 2015-05-09 ENCOUNTER — Encounter: Payer: Self-pay | Admitting: Family Medicine

## 2015-05-09 VITALS — BP 148/80 | HR 78 | Temp 98.4°F | Resp 14 | Ht <= 58 in | Wt 157.0 lb

## 2015-05-09 DIAGNOSIS — Z794 Long term (current) use of insulin: Secondary | ICD-10-CM

## 2015-05-09 DIAGNOSIS — I10 Essential (primary) hypertension: Secondary | ICD-10-CM | POA: Diagnosis not present

## 2015-05-09 DIAGNOSIS — E119 Type 2 diabetes mellitus without complications: Secondary | ICD-10-CM

## 2015-05-09 DIAGNOSIS — E785 Hyperlipidemia, unspecified: Secondary | ICD-10-CM

## 2015-05-09 DIAGNOSIS — E222 Syndrome of inappropriate secretion of antidiuretic hormone: Secondary | ICD-10-CM

## 2015-05-09 DIAGNOSIS — J302 Other seasonal allergic rhinitis: Secondary | ICD-10-CM | POA: Diagnosis not present

## 2015-05-09 LAB — COMPREHENSIVE METABOLIC PANEL
ALT: 15 U/L (ref 6–29)
AST: 19 U/L (ref 10–35)
Albumin: 4.1 g/dL (ref 3.6–5.1)
Alkaline Phosphatase: 103 U/L (ref 33–130)
BUN: 20 mg/dL (ref 7–25)
CALCIUM: 9.5 mg/dL (ref 8.6–10.4)
CO2: 22 mmol/L (ref 20–31)
Chloride: 104 mmol/L (ref 98–110)
Creat: 0.98 mg/dL — ABNORMAL HIGH (ref 0.60–0.93)
GLUCOSE: 218 mg/dL — AB (ref 70–99)
POTASSIUM: 4.5 mmol/L (ref 3.5–5.3)
Sodium: 137 mmol/L (ref 135–146)
Total Bilirubin: 0.4 mg/dL (ref 0.2–1.2)
Total Protein: 6.7 g/dL (ref 6.1–8.1)

## 2015-05-09 LAB — CBC WITH DIFFERENTIAL/PLATELET
BASOS ABS: 0 10*3/uL (ref 0.0–0.1)
Basophils Relative: 1 % (ref 0–1)
EOS ABS: 0.1 10*3/uL (ref 0.0–0.7)
EOS PCT: 3 % (ref 0–5)
HEMATOCRIT: 40.5 % (ref 36.0–46.0)
Hemoglobin: 12.8 g/dL (ref 12.0–15.0)
LYMPHS PCT: 32 % (ref 12–46)
Lymphs Abs: 1.6 10*3/uL (ref 0.7–4.0)
MCH: 26.4 pg (ref 26.0–34.0)
MCHC: 31.6 g/dL (ref 30.0–36.0)
MCV: 83.7 fL (ref 78.0–100.0)
MONO ABS: 0.4 10*3/uL (ref 0.1–1.0)
MPV: 10.9 fL (ref 8.6–12.4)
Monocytes Relative: 9 % (ref 3–12)
Neutro Abs: 2.7 10*3/uL (ref 1.7–7.7)
Neutrophils Relative %: 55 % (ref 43–77)
PLATELETS: 186 10*3/uL (ref 150–400)
RBC: 4.84 MIL/uL (ref 3.87–5.11)
RDW: 14.1 % (ref 11.5–15.5)
WBC: 4.9 10*3/uL (ref 4.0–10.5)

## 2015-05-09 LAB — LIPID PANEL
CHOL/HDL RATIO: 3 ratio (ref <=5.0)
CHOLESTEROL: 164 mg/dL (ref 125–200)
HDL: 55 mg/dL (ref >=46)
LDL CALC: 81 mg/dL (ref <130)
TRIGLYCERIDES: 141 mg/dL (ref <150)
VLDL: 28 mg/dL (ref <30)

## 2015-05-09 MED ORDER — AMLODIPINE BESYLATE 5 MG PO TABS: 5.0000 mg | ORAL_TABLET | Freq: Every day | ORAL | Status: DC

## 2015-05-09 MED ORDER — LORATADINE 10 MG PO TABS: 10.0000 mg | ORAL_TABLET | Freq: Every day | ORAL | Status: DC

## 2015-05-09 MED ORDER — METOPROLOL TARTRATE 25 MG PO TABS: 25.0000 mg | ORAL_TABLET | Freq: Two times a day (BID) | ORAL | Status: DC

## 2015-05-09 NOTE — Assessment & Plan Note (Signed)
Uncontrolled and she is out of her medications.

## 2015-05-09 NOTE — Assessment & Plan Note (Signed)
Hopefully her sodium levels will be within normal limits we'll recheck these today

## 2015-05-09 NOTE — Assessment & Plan Note (Signed)
Diabetes has been fairly well-controlled and recheck her labs today she will continue her current insulin dosing 36 units at bedtime and 3 units of short acting with meals she's currently on statin drug and we'll restart her blood pressure medications

## 2015-05-09 NOTE — Progress Notes (Signed)
Patient ID: Joyce Gilmore, female   DOB: Aug 28, 1934, 81 y.o.   MRN: [id number]   Subjective:    Patient ID: Joyce Gilmore, female    DOB: 1934/06/04, 81 y.o.   MRN: [id number]  Patient presents for F/U Patient follow-up diabetes mellitus. She is return from [place] where she was using family. She's history of SIADH which causes hyponatremia recurrently. She is due for recheck of her labs today. Diabetes mellitus her last A1c 7.4% she is injecting 36 units of Lantus and 3 units of NovoLog twice a day with meals. Her fasting blood sugars range 98-145, she did have a few elevated blood sugar in the 200s Cholesterol has been at goal due for repeat today  She's had some mild allergy issues occasional cough. She is not taking anything for allergies.  She's been out of her blood pressure medications.  Note she no longer requires the trazodone Review Of Systems:  GEN- denies fatigue, fever, weight loss,weakness, recent illness HEENT- denies eye drainage, change in vision, nasal discharge, CVS- denies chest pain, palpitations RESP- denies SOB, cough, wheeze ABD- denies N/V, change in stools, abd pain GU- denies dysuria, hematuria, dribbling, incontinence MSK- denies joint pain, muscle aches, injury Neuro- denies headache, dizziness, syncope, seizure activity       Objective:    BP 148/80 mmHg  Pulse 78  Temp(Src) 98.4 F (36.9 C) (Oral)  Resp 14  Ht 4\' 8"  (1.422 m)  Wt 157 lb (71.215 kg)  BMI 35.22 kg/m2 GEN- NAD, alert and oriented x3 HEENT- PERRL, EOMI, non injected sclera, pink conjunctiva, MMM, oropharynx clear, TM clear bilat, no effusion, nares clear  Neck- Supple, no thyromegaly CVS- RRR, no murmur RESP-CTAB EXT- No edema Pulses- Radial, DP- 2+        Assessment & Plan:      Problem List Items Addressed This Visit    SIADH (syndrome of inappropriate ADH production) ([place])    Hopefully her sodium levels will be within normal limits we'll recheck these today       Seasonal allergies   HLD (hyperlipidemia)   Relevant Medications   metoprolol tartrate (LOPRESSOR) 25 MG tablet   amLODipine (NORVASC) 5 MG tablet   Other Relevant Orders   Lipid panel   Essential hypertension    Uncontrolled and she is out of her medications.      Relevant Medications   metoprolol tartrate (LOPRESSOR) 25 MG tablet   amLODipine (NORVASC) 5 MG tablet   Diabetes mellitus type II, controlled ([place]) - Primary    Diabetes has been fairly well-controlled and recheck her labs today she will continue her current insulin dosing 36 units at bedtime and 3 units of short acting with meals she's currently on statin drug and we'll restart her blood pressure medications      Relevant Orders   CBC with Differential/Platelet   Comprehensive metabolic panel   Hemoglobin A1c   HM DIABETES FOOT EXAM (Completed)      Note: This dictation was prepared with Dragon dictation along with smaller phrase technology. Any transcriptional errors that result from this process are unintentional.

## 2015-05-09 NOTE — Patient Instructions (Signed)
F/U 4 months  We will call with lab results  

## 2015-05-10 LAB — HEMOGLOBIN A1C
Hgb A1c MFr Bld: 7.7 % — ABNORMAL HIGH (ref <5.7)
Mean Plasma Glucose: 174 mg/dL — ABNORMAL HIGH (ref <117)

## 2015-05-24 ENCOUNTER — Ambulatory Visit
Admission: RE | Admit: 2015-05-24 | Discharge: 2015-05-24 | Disposition: A | Discharge status: Home / Self Care | Payer: Medicaid Other | Source: Ambulatory Visit

## 2015-05-24 DIAGNOSIS — Z1231 Encounter for screening mammogram for malignant neoplasm of breast: Secondary | ICD-10-CM

## 2015-05-24 LAB — HM MAMMOGRAPHY

## 2015-05-29 ENCOUNTER — Encounter: Payer: Self-pay | Admitting: Family Medicine

## 2015-06-04 ENCOUNTER — Other Ambulatory Visit: Payer: Self-pay | Admitting: Family Medicine

## 2015-06-05 NOTE — Telephone Encounter (Signed)
Refill appropriate and filled per protocol. 

## 2015-07-05 ENCOUNTER — Other Ambulatory Visit: Payer: Self-pay | Admitting: Family Medicine

## 2015-07-05 NOTE — Telephone Encounter (Signed)
Refill appropriate and filled per protocol. 

## 2015-07-06 NOTE — Telephone Encounter (Signed)
Refill appropriate and filled per protocol. 

## 2015-07-18 ENCOUNTER — Ambulatory Visit (INDEPENDENT_AMBULATORY_CARE_PROVIDER_SITE_OTHER): Payer: Medicaid Other | Admitting: Family Medicine

## 2015-07-18 ENCOUNTER — Encounter: Payer: Self-pay | Admitting: Family Medicine

## 2015-07-18 VITALS — BP 132/68 | HR 60 | Temp 98.5°F | Resp 16 | Ht <= 58 in | Wt 163.0 lb

## 2015-07-18 DIAGNOSIS — J209 Acute bronchitis, unspecified: Secondary | ICD-10-CM

## 2015-07-18 MED ORDER — ALBUTEROL SULFATE HFA 108 (90 BASE) MCG/ACT IN AERS
1.0000 | INHALATION_SPRAY | RESPIRATORY_TRACT | Status: DC | PRN
Start: 2015-07-18 — End: 2015-10-10

## 2015-07-18 MED ORDER — BENZONATATE 100 MG PO CAPS
100.0000 mg | ORAL_CAPSULE | Freq: Two times a day (BID) | ORAL | Status: DC | PRN
Start: 2015-07-18 — End: 2015-08-22

## 2015-07-18 MED ORDER — AZITHROMYCIN 250 MG PO TABS: ORAL_TABLET | ORAL | Status: DC

## 2015-07-18 NOTE — Progress Notes (Signed)
Patient ID: Joyce Gilmore, female   DOB: January 18, 1935, 81 y.o.   MRN: [id number]    Subjective:    Patient ID: Joyce Gilmore, female    DOB: [birth year], 81 y.o.   MRN: [id number]  Patient presents for Illness Patient here with cough with yellow production wheezing a little shortness of breath at night for the past 2 weeks. She has not had any significant drainage no fever she does fairly well during the day. They have not given any over-the-counter cough medicine. No known sick contacts. She is leaving to go to [place] in 2 weeks.  She states her blood sugars have been good she did not bring her meter with her today she's taken off her medications. Her daughter informs me that she'll be going back to [place] with her son for 2 months.  Spanish interpreter present  Review Of Systems:  GEN- denies fatigue, fever, weight loss,weakness, recent illness HEENT- denies eye drainage, change in vision, nasal discharge, CVS- denies chest pain, palpitations RESP- + SOB,+ cough, +wheeze ABD- denies N/V, change in stools, abd pain GU- denies dysuria, hematuria, dribbling, incontinence MSK- denies joint pain, muscle aches, injury Neuro- denies headache, dizziness, syncope, seizure activity       Objective:    BP 132/68 mmHg  Pulse 60  Temp(Src) 98.5 F (36.9 C) (Oral)  Resp 16  Ht 4\' 8"  (1.422 m)  Wt 163 lb (73.936 kg)  BMI 36.56 kg/m2  SpO2 95% GEN- NAD, alert and oriented x3, non toxic appearing  HEENT- PERRL, EOMI, non injected sclera, pink conjunctiva, MMM, oropharynx clear Neck- Supple, no JVD, no LAD  CVS- RRR, no murmur RESP-congestion bilat, few expiratory wheeze, normal WOB, normal Sat  ABD-NABS,soft,NT,ND EXT- No edema Pulses- Radial 2+        Assessment & Plan:      Problem List Items Addressed This Visit    None    Visit Diagnoses    Acute bronchitis, unspecified organism    -  Primary    History for bronchitis. I'm concerned with her leaving the country and how  quickly she declines. Z-Pak Tessalon albuterol given. I also gave Extra box of Lantus and NovoLog as she has Medicaid and cannot get more than 1 month supply of at a time        Note: This dictation was prepared with Dragon dictation along with smaller phrase technology. Any transcriptional errors that result from this process are unintentional.

## 2015-07-18 NOTE — Patient Instructions (Signed)
Take antibiotics as prescribed Cough pills Use albuterol inhaler  F/U after you return from [place]

## 2015-08-06 ENCOUNTER — Other Ambulatory Visit: Payer: Self-pay | Admitting: Family Medicine

## 2015-08-08 ENCOUNTER — Other Ambulatory Visit: Payer: Self-pay | Admitting: Family Medicine

## 2015-08-08 ENCOUNTER — Other Ambulatory Visit: Payer: Self-pay | Admitting: *Deleted

## 2015-08-08 MED ORDER — LISINOPRIL 10 MG PO TABS: 10.0000 mg | ORAL_TABLET | Freq: Every day | ORAL | Status: DC

## 2015-08-08 MED ORDER — LORATADINE 10 MG PO TABS: 10.0000 mg | ORAL_TABLET | Freq: Every day | ORAL | Status: DC

## 2015-08-08 MED ORDER — PRAVASTATIN SODIUM 20 MG PO TABS: 20.0000 mg | ORAL_TABLET | Freq: Every day | ORAL | Status: DC

## 2015-08-08 MED ORDER — INSULIN GLARGINE 100 UNIT/ML SOLOSTAR PEN: 36.0000 [IU] | PEN_INJECTOR | Freq: Every day | SUBCUTANEOUS | Status: DC

## 2015-08-08 MED ORDER — SUCRALFATE 1 G PO TABS
1.0000 g | ORAL_TABLET | Freq: Four times a day (QID) | ORAL | Status: DC
Start: 2015-08-08 — End: 2016-03-11

## 2015-08-08 MED ORDER — INSULIN ASPART 100 UNIT/ML FLEXPEN: PEN_INJECTOR | SUBCUTANEOUS | Status: DC

## 2015-08-08 MED ORDER — LANSOPRAZOLE 30 MG PO CPDR: DELAYED_RELEASE_CAPSULE | ORAL | Status: DC

## 2015-08-08 MED ORDER — METOPROLOL TARTRATE 25 MG PO TABS: 25.0000 mg | ORAL_TABLET | Freq: Two times a day (BID) | ORAL | Status: DC

## 2015-08-08 MED ORDER — AMLODIPINE BESYLATE 5 MG PO TABS: 5.0000 mg | ORAL_TABLET | Freq: Every day | ORAL | Status: DC

## 2015-08-08 NOTE — Telephone Encounter (Signed)
Received call from patient family stating that patient is leaving on 08/09/2015.   Requested refill on all meds before patient returns to [place].   Prescription sent to pharmacy.

## 2015-08-08 NOTE — Telephone Encounter (Signed)
Refill appropriate and filled per protocol. 

## 2015-08-08 NOTE — Telephone Encounter (Signed)
Pt needs a refill of softclix lancets, sucralfate, benzonate, lisinopril, loratadine and lantus sent to the [facility] on [address]

## 2015-08-22 ENCOUNTER — Other Ambulatory Visit: Payer: Self-pay | Admitting: Family Medicine

## 2015-08-22 NOTE — Telephone Encounter (Signed)
Ok to refill 

## 2015-08-22 NOTE — Telephone Encounter (Signed)
okay

## 2015-08-23 NOTE — Telephone Encounter (Signed)
Prescription sent to pharmacy.

## 2015-08-29 IMAGING — CR DG THORACIC SPINE 3V
3 series · 3 of 3 positions shown · non-contrast
Comparison: Chest x-rays from 12/25/2013 and 03/13/2012.

CLINICAL DATA: Initial encounter for four week history of low
thoracic spine pain right greater than left. No history of trauma.

EXAM:
THORACIC SPINE - 2 VIEW + SWIMMERS

[t t-spine a.p.]
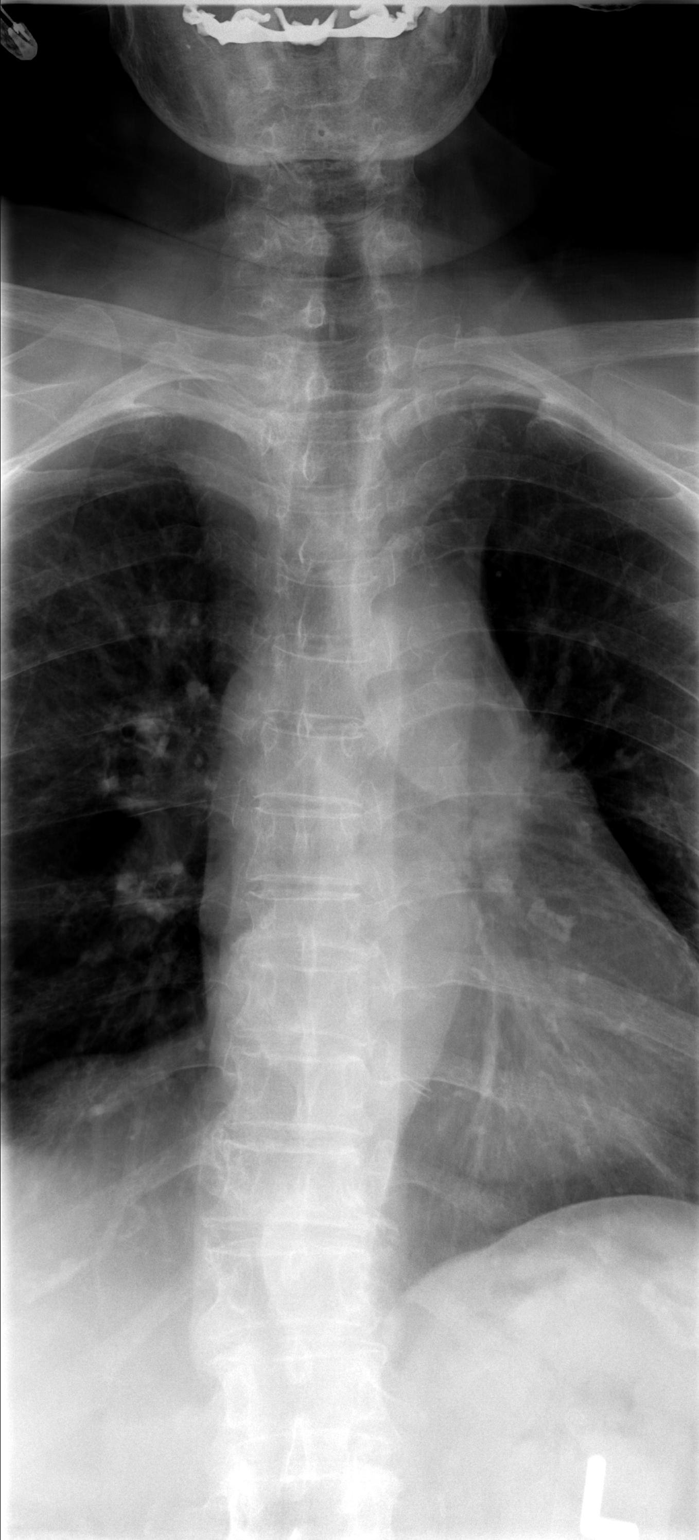

[t t-spine lat *]
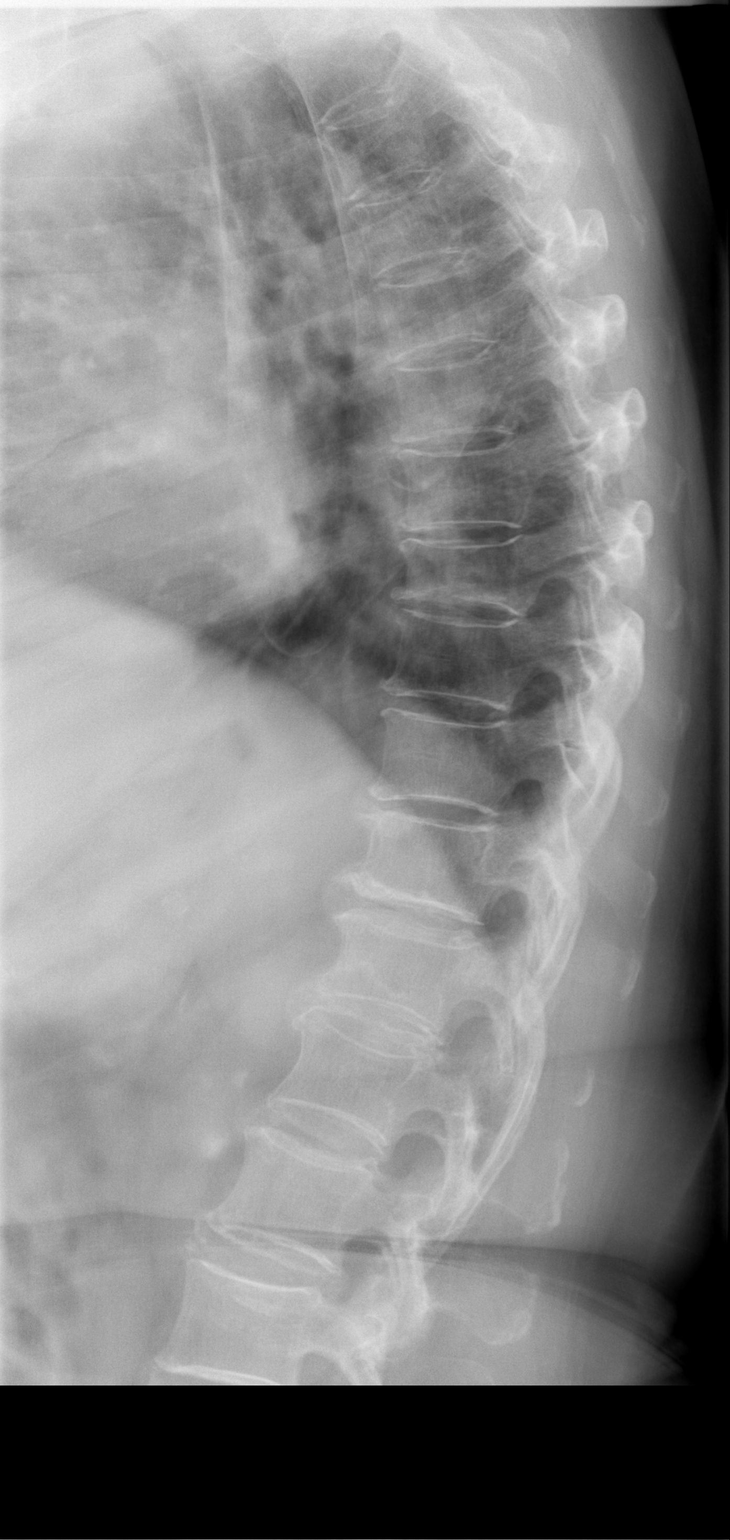

[t swimmers]
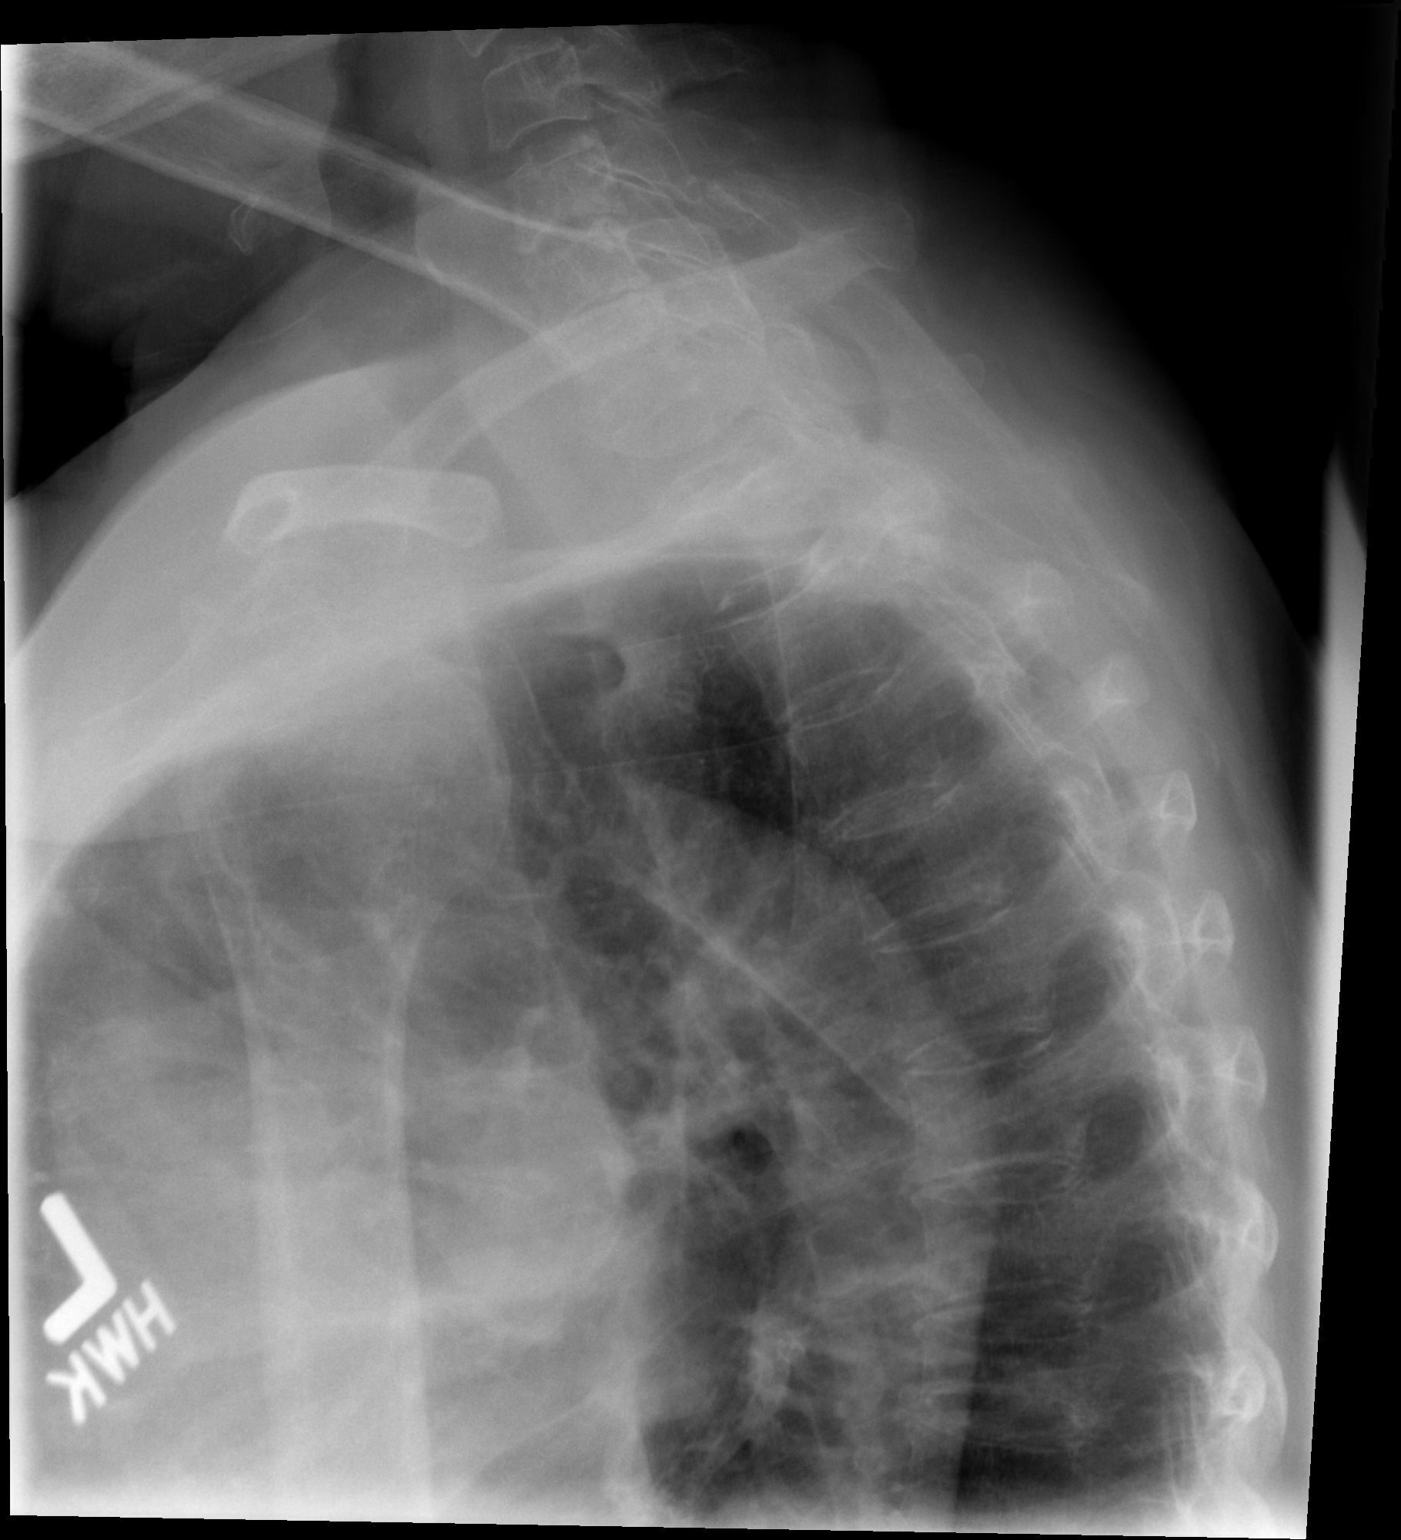

[3 of 3 positions shown; findings below may reference images not displayed]

FINDINGS: Two view exam of the thoracic spine shows compression deformity of
the T8 vertebral body resulting in about 25% loss of height. This
appears to mainly involve the inferior cortex. This is not changed
since 12/25/2013, but is new since 03/13/2012. Imaging appearance by
x-ray is age indeterminate.
IMPRESSION: Age indeterminate compression fracture at T8 results in about 25%
loss of height, mainly involving the inferior cortex.

## 2015-09-10 ENCOUNTER — Ambulatory Visit: Payer: Medicaid Other | Admitting: Family Medicine

## 2015-10-10 ENCOUNTER — Ambulatory Visit (INDEPENDENT_AMBULATORY_CARE_PROVIDER_SITE_OTHER): Payer: Medicaid Other | Admitting: Family Medicine

## 2015-10-10 ENCOUNTER — Encounter: Payer: Self-pay | Admitting: Family Medicine

## 2015-10-10 ENCOUNTER — Ambulatory Visit
Admission: RE | Admit: 2015-10-10 | Discharge: 2015-10-10 | Disposition: A | Discharge status: Home / Self Care | Payer: Medicaid Other | Source: Ambulatory Visit | Attending: Family Medicine | Admitting: Family Medicine

## 2015-10-10 VITALS — BP 128/64 | HR 62 | Temp 98.8°F | Resp 22 | Wt 166.0 lb

## 2015-10-10 DIAGNOSIS — M7989 Other specified soft tissue disorders: Secondary | ICD-10-CM

## 2015-10-10 DIAGNOSIS — I519 Heart disease, unspecified: Secondary | ICD-10-CM | POA: Diagnosis not present

## 2015-10-10 DIAGNOSIS — R0789 Other chest pain: Secondary | ICD-10-CM

## 2015-10-10 DIAGNOSIS — J209 Acute bronchitis, unspecified: Secondary | ICD-10-CM

## 2015-10-10 DIAGNOSIS — I5189 Other ill-defined heart diseases: Secondary | ICD-10-CM

## 2015-10-10 DIAGNOSIS — E119 Type 2 diabetes mellitus without complications: Secondary | ICD-10-CM | POA: Diagnosis not present

## 2015-10-10 DIAGNOSIS — Z794 Long term (current) use of insulin: Secondary | ICD-10-CM | POA: Diagnosis not present

## 2015-10-10 MED ORDER — FUROSEMIDE 20 MG PO TABS: 20.0000 mg | ORAL_TABLET | Freq: Every day | ORAL | 0 refills | Status: DC

## 2015-10-10 MED ORDER — ALBUTEROL SULFATE HFA 108 (90 BASE) MCG/ACT IN AERS: 1.0000 | INHALATION_SPRAY | RESPIRATORY_TRACT | 1 refills | Status: DC | PRN

## 2015-10-10 MED ORDER — METHYLPREDNISOLONE ACETATE 40 MG/ML IJ SUSP
40.0000 mg | Freq: Once | INTRAMUSCULAR | Status: AC
  Administered 2015-10-10: 40 mg via INTRAMUSCULAR

## 2015-10-10 MED ORDER — BENZONATATE 100 MG PO CAPS: ORAL_CAPSULE | ORAL | 0 refills | Status: DC

## 2015-10-10 NOTE — Assessment & Plan Note (Signed)
Continue current dose of insulin check renal function with A1C

## 2015-10-10 NOTE — Progress Notes (Signed)
Subjective:    Patient ID: Joyce Gilmore, female    DOB: [birth year], 81 y.o.   MRN: [id number]  Patient presents for Other (Cough, wheezing, feet swelling)  Daughter is interpreting   Pt here with cough, wheezingAnd chest tightness for the past 4 days. She's also had leg swelling for the past 2 weeks. Her daughter states that she has complained of some right arm pain but denies any numbness or tingling in the arm. She has been trying to use her albuterol inhaler. Her cough has been productive of sputum but she has not had any fever or chills  Recently returned from prolonged stay in [place], yesterday. Unfortunately every times she returns from a trip she has an illness. She has been treated for bronchitis multiple times, CXR in 2016 showed chronic peribronchial thickening Echo 2016 EF normal, mild diastolic dysfunction   Diabetes mellitus her blood sugars have been from 140s to 180s on average she is injecting her insulin as prescribed  Review Of Systems:  GEN- denies fatigue, fever, weight loss,weakness, recent illness HEENT- denies eye drainage, change in vision, nasal discharge, CVS- denies chest pain, palpitations RESP- denies SOB, cough, wheeze ABD- denies N/V, change in stools, abd pain GU- denies dysuria, hematuria, dribbling, incontinence MSK- denies joint pain, muscle aches, injury Neuro- denies headache, dizziness, syncope, seizure activity       Objective:    BP 128/64   Pulse 62   Temp 98.8 F (37.1 C) (Oral)   Resp (!) 22   Wt 166 lb (75.3 kg)   SpO2 97%   BMI 37.22 kg/m  GEN- NAD, alert and oriented x3 ,weigth u 3lbs HEENT- PERRL, EOMI, non injected sclera, pink conjunctiva, MMM, oropharynx clear Neck- Supple, no JVD  CVS- RRR, no murmur RESP-bilat wheeze, mild crackles ,audible wheezing, fair air movement, normal oxygen sat ABD-NABS,soft,NT,ND EXT- 1+ pitting  Edema to shins  Pulses- Radial  2+        Assessment & Plan:      Problem List  Items Addressed This Visit    Diabetes mellitus type II, controlled ([place]) - Primary    Continue current dose of insulin check renal function with A1C      Relevant Orders   CBC with Differential/Platelet   Comprehensive metabolic panel   Hemoglobin A1c    Other Visit Diagnoses    Other chest pain       Chest pain due to Bronchitis, EKG reassuring except shows lung disease, which she has pulmonary hypertension on charts   Relevant Orders   EKG 12-Lead (Completed)   Brain natriuretic peptide   Acute bronchitis, unspecified organism       recurrent episodes, I beleive chest pain is mostly due to her breathing, but with leg edema EKG obtained. Given neb in office. Needs PFT   Relevant Orders   DG Chest 2 View   Diastolic dysfunction       known DD in setting of leg swelling may be decompensation, weight up 3lbs as well. Will check BNP will give lasix 20mg , recheck in 48 hours, elevate feet   Relevant Orders   Brain natriuretic peptide   Leg swelling       note cirrhosis seen on [place] in past, normal LFT, no ETOH or tobacco history, check labs today, also possible culprit is liver disease but no other signs of liver dysfunction   Relevant Orders   DG Chest 2 View   Brain natriuretic peptide  Note: This dictation was prepared with Dragon dictation along with smaller phrase technology. Any transcriptional errors that result from this process are unintentional.

## 2015-10-10 NOTE — Addendum Note (Signed)
Addended by: Shary Decamp B on: 10/10/2015 12:41 PM   Modules accepted: Orders

## 2015-10-10 NOTE — Patient Instructions (Addendum)
Get chest xray - [place] [address] We will call with lab results  Take the steroids  Take water pill- LASIX 20MG  in the morning  Cough pill- TESSALON Albuterol inhaler Increase lantus to 38 at BEDTIME F/U Friday for rehceck

## 2015-10-11 LAB — CBC WITH DIFFERENTIAL/PLATELET
BASOS PCT: 1 %
Basophils Absolute: 64 cells/uL (ref 0–200)
EOS ABS: 320 {cells}/uL (ref 15–500)
Eosinophils Relative: 5 %
HEMATOCRIT: 37.6 % (ref 35.0–45.0)
HEMOGLOBIN: 11.9 g/dL — AB (ref 12.0–15.0)
LYMPHS ABS: 1216 {cells}/uL (ref 850–3900)
LYMPHS PCT: 19 %
MCH: 28.1 pg (ref 27.0–33.0)
MCHC: 31.6 g/dL — ABNORMAL LOW (ref 32.0–36.0)
MCV: 88.9 fL (ref 80.0–100.0)
MONO ABS: 640 {cells}/uL (ref 200–950)
MPV: 10.1 fL (ref 7.5–12.5)
Monocytes Relative: 10 %
NEUTROS ABS: 4160 {cells}/uL (ref 1500–7800)
Neutrophils Relative %: 65 %
Platelets: 172 10*3/uL (ref 140–400)
RBC: 4.23 MIL/uL (ref 3.80–5.10)
RDW: 13.2 % (ref 11.0–15.0)
WBC: 6.4 10*3/uL (ref 3.8–10.8)

## 2015-10-11 LAB — COMPREHENSIVE METABOLIC PANEL
ALT: 15 U/L (ref 6–29)
AST: 21 U/L (ref 10–35)
Albumin: 3.6 g/dL (ref 3.6–5.1)
Alkaline Phosphatase: 74 U/L (ref 33–130)
BILIRUBIN TOTAL: 0.4 mg/dL (ref 0.2–1.2)
BUN: 19 mg/dL (ref 7–25)
CALCIUM: 8.4 mg/dL — AB (ref 8.6–10.4)
CO2: 19 mmol/L — AB (ref 20–31)
Chloride: 93 mmol/L — ABNORMAL LOW (ref 98–110)
Creat: 1.12 mg/dL — ABNORMAL HIGH (ref 0.60–0.93)
GLUCOSE: 233 mg/dL — AB (ref 70–99)
Potassium: 5 mmol/L (ref 3.5–5.3)
Sodium: 125 mmol/L — ABNORMAL LOW (ref 135–146)
Total Protein: 6.1 g/dL (ref 6.1–8.1)

## 2015-10-11 LAB — HEMOGLOBIN A1C
Hgb A1c MFr Bld: 9.5 % — ABNORMAL HIGH (ref <5.7)
Mean Plasma Glucose: 226 mg/dL

## 2015-10-11 LAB — BRAIN NATRIURETIC PEPTIDE: Brain Natriuretic Peptide: 208.1 pg/mL — ABNORMAL HIGH

## 2015-10-12 ENCOUNTER — Ambulatory Visit (INDEPENDENT_AMBULATORY_CARE_PROVIDER_SITE_OTHER): Payer: Medicaid Other | Admitting: Family Medicine

## 2015-10-12 ENCOUNTER — Encounter: Payer: Self-pay | Admitting: Family Medicine

## 2015-10-12 ENCOUNTER — Emergency Department (HOSPITAL_COMMUNITY): Payer: Medicaid Other

## 2015-10-12 ENCOUNTER — Observation Stay (HOSPITAL_COMMUNITY)
Admission: EM | Admit: 2015-10-12 | Discharge: 2015-10-14 | Disposition: A | Discharge status: Home / Self Care | Payer: Medicaid Other | Attending: Family Medicine | Admitting: Family Medicine

## 2015-10-12 VITALS — BP 134/60 | HR 58 | Temp 98.5°F | Resp 22 | Ht 59.0 in | Wt 164.0 lb

## 2015-10-12 DIAGNOSIS — I272 Other secondary pulmonary hypertension: Secondary | ICD-10-CM | POA: Insufficient documentation

## 2015-10-12 DIAGNOSIS — J209 Acute bronchitis, unspecified: Secondary | ICD-10-CM | POA: Diagnosis not present

## 2015-10-12 DIAGNOSIS — Z79899 Other long term (current) drug therapy: Secondary | ICD-10-CM | POA: Diagnosis not present

## 2015-10-12 DIAGNOSIS — N179 Acute kidney failure, unspecified: Secondary | ICD-10-CM | POA: Diagnosis not present

## 2015-10-12 DIAGNOSIS — M810 Age-related osteoporosis without current pathological fracture: Secondary | ICD-10-CM | POA: Insufficient documentation

## 2015-10-12 DIAGNOSIS — I5033 Acute on chronic diastolic (congestive) heart failure: Secondary | ICD-10-CM | POA: Insufficient documentation

## 2015-10-12 DIAGNOSIS — Z794 Long term (current) use of insulin: Secondary | ICD-10-CM

## 2015-10-12 DIAGNOSIS — E222 Syndrome of inappropriate secretion of antidiuretic hormone: Secondary | ICD-10-CM

## 2015-10-12 DIAGNOSIS — E1165 Type 2 diabetes mellitus with hyperglycemia: Secondary | ICD-10-CM | POA: Insufficient documentation

## 2015-10-12 DIAGNOSIS — Z96641 Presence of right artificial hip joint: Secondary | ICD-10-CM | POA: Insufficient documentation

## 2015-10-12 DIAGNOSIS — I110 Hypertensive heart disease with heart failure: Principal | ICD-10-CM | POA: Insufficient documentation

## 2015-10-12 DIAGNOSIS — E119 Type 2 diabetes mellitus without complications: Secondary | ICD-10-CM | POA: Diagnosis not present

## 2015-10-12 DIAGNOSIS — I5032 Chronic diastolic (congestive) heart failure: Secondary | ICD-10-CM | POA: Insufficient documentation

## 2015-10-12 DIAGNOSIS — E785 Hyperlipidemia, unspecified: Secondary | ICD-10-CM | POA: Insufficient documentation

## 2015-10-12 DIAGNOSIS — Z96653 Presence of artificial knee joint, bilateral: Secondary | ICD-10-CM | POA: Insufficient documentation

## 2015-10-12 DIAGNOSIS — R0602 Shortness of breath: Secondary | ICD-10-CM | POA: Diagnosis present

## 2015-10-12 DIAGNOSIS — E871 Hypo-osmolality and hyponatremia: Secondary | ICD-10-CM

## 2015-10-12 DIAGNOSIS — Z8614 Personal history of Methicillin resistant Staphylococcus aureus infection: Secondary | ICD-10-CM | POA: Diagnosis not present

## 2015-10-12 DIAGNOSIS — J302 Other seasonal allergic rhinitis: Secondary | ICD-10-CM | POA: Diagnosis not present

## 2015-10-12 LAB — CBC
HEMATOCRIT: 37.7 % (ref 36.0–46.0)
Hemoglobin: 12.3 g/dL (ref 12.0–15.0)
MCH: 28.5 pg (ref 26.0–34.0)
MCHC: 32.6 g/dL (ref 30.0–36.0)
MCV: 87.5 fL (ref 78.0–100.0)
PLATELETS: 171 10*3/uL (ref 150–400)
RBC: 4.31 MIL/uL (ref 3.87–5.11)
RDW: 12.9 % (ref 11.5–15.5)
WBC: 8.8 10*3/uL (ref 4.0–10.5)

## 2015-10-12 LAB — COMPREHENSIVE METABOLIC PANEL
ALBUMIN: 3.7 g/dL (ref 3.5–5.0)
ALK PHOS: 78 U/L (ref 38–126)
ALT: 19 U/L (ref 14–54)
ANION GAP: 8 (ref 5–15)
AST: 28 U/L (ref 15–41)
BUN: 11 mg/dL (ref 6–20)
CO2: 24 mmol/L (ref 22–32)
Calcium: 9 mg/dL (ref 8.9–10.3)
Chloride: 93 mmol/L — ABNORMAL LOW (ref 101–111)
Creatinine, Ser: 0.97 mg/dL (ref 0.44–1.00)
GFR calc Af Amer: >60 mL/min (ref >60)
GFR calc non Af Amer: 55 mL/min — ABNORMAL LOW (ref >60)
GLUCOSE: 162 mg/dL — AB (ref 65–99)
POTASSIUM: 4.3 mmol/L (ref 3.5–5.1)
SODIUM: 125 mmol/L — AB (ref 135–145)
Total Bilirubin: 0.5 mg/dL (ref 0.3–1.2)
Total Protein: 6.9 g/dL (ref 6.5–8.1)

## 2015-10-12 LAB — GLUCOSE, CAPILLARY: GLUCOSE-CAPILLARY: 481 mg/dL — AB (ref 65–99)

## 2015-10-12 LAB — BRAIN NATRIURETIC PEPTIDE: B Natriuretic Peptide: 246.9 pg/mL — ABNORMAL HIGH (ref 0.0–100.0)

## 2015-10-12 LAB — I-STAT TROPONIN, ED: Troponin i, poc: 0.01 ng/mL (ref 0.00–0.08)

## 2015-10-12 MED ORDER — DEXTROSE 5 % IV SOLN: 1.0000 g | INTRAVENOUS | Status: DC

## 2015-10-12 MED ORDER — DEXTROSE 5 % IV SOLN
500.0000 mg | INTRAVENOUS | Status: DC
Start: 2015-10-13 — End: 2015-10-12

## 2015-10-12 MED ORDER — METOPROLOL TARTRATE 25 MG PO TABS
25.0000 mg | ORAL_TABLET | Freq: Two times a day (BID) | ORAL | Status: DC
  Administered 2015-10-12 – 2015-10-14 (×4): 25 mg via ORAL
  Filled 2015-10-12 (×5): qty 1

## 2015-10-12 MED ORDER — PRAVASTATIN SODIUM 20 MG PO TABS
20.0000 mg | ORAL_TABLET | Freq: Every day | ORAL | Status: DC
  Administered 2015-10-13 – 2015-10-14 (×2): 20 mg via ORAL
  Filled 2015-10-12 (×2): qty 1

## 2015-10-12 MED ORDER — ACETAMINOPHEN 650 MG RE SUPP: 650.0000 mg | Freq: Four times a day (QID) | RECTAL | Status: DC | PRN

## 2015-10-12 MED ORDER — ACETAMINOPHEN 325 MG PO TABS: 650.0000 mg | ORAL_TABLET | Freq: Four times a day (QID) | ORAL | Status: DC | PRN

## 2015-10-12 MED ORDER — BENZONATATE 100 MG PO CAPS: 100.0000 mg | ORAL_CAPSULE | Freq: Three times a day (TID) | ORAL | Status: DC | PRN

## 2015-10-12 MED ORDER — HEPARIN SODIUM (PORCINE) 5000 UNIT/ML IJ SOLN
5000.0000 [IU] | Freq: Three times a day (TID) | INTRAMUSCULAR | Status: DC
  Administered 2015-10-12 – 2015-10-14 (×6): 5000 [IU] via SUBCUTANEOUS
  Filled 2015-10-12 (×6): qty 1

## 2015-10-12 MED ORDER — IPRATROPIUM-ALBUTEROL 0.5-2.5 (3) MG/3ML IN SOLN
3.0000 mL | RESPIRATORY_TRACT | Status: DC
  Administered 2015-10-12: 3 mL via RESPIRATORY_TRACT
  Filled 2015-10-12: qty 3

## 2015-10-12 MED ORDER — ONDANSETRON HCL 4 MG/2ML IJ SOLN: 4.0000 mg | Freq: Four times a day (QID) | INTRAMUSCULAR | Status: DC | PRN

## 2015-10-12 MED ORDER — INSULIN ASPART 100 UNIT/ML ~~LOC~~ SOLN: 0.0000 [IU] | Freq: Three times a day (TID) | SUBCUTANEOUS | Status: DC

## 2015-10-12 MED ORDER — SUCRALFATE 1 G PO TABS
1.0000 g | ORAL_TABLET | Freq: Four times a day (QID) | ORAL | Status: DC
  Administered 2015-10-12 – 2015-10-14 (×8): 1 g via ORAL
  Filled 2015-10-12 (×7): qty 1

## 2015-10-12 MED ORDER — AMLODIPINE BESYLATE 5 MG PO TABS
5.0000 mg | ORAL_TABLET | Freq: Every day | ORAL | Status: DC
  Administered 2015-10-13 – 2015-10-14 (×2): 5 mg via ORAL
  Filled 2015-10-12 (×2): qty 1

## 2015-10-12 MED ORDER — POLYETHYLENE GLYCOL 3350 17 G PO PACK: 17.0000 g | PACK | Freq: Every day | ORAL | Status: DC | PRN

## 2015-10-12 MED ORDER — INSULIN ASPART 100 UNIT/ML ~~LOC~~ SOLN
6.0000 [IU] | Freq: Once | SUBCUTANEOUS | Status: AC
  Administered 2015-10-12: 6 [IU] via SUBCUTANEOUS

## 2015-10-12 MED ORDER — DEXTROSE 5 % IV SOLN
500.0000 mg | Freq: Once | INTRAVENOUS | Status: AC
  Administered 2015-10-12: 500 mg via INTRAVENOUS
  Filled 2015-10-12: qty 500

## 2015-10-12 MED ORDER — INSULIN ASPART 100 UNIT/ML ~~LOC~~ SOLN
0.0000 [IU] | Freq: Every day | SUBCUTANEOUS | Status: DC
Start: 2015-10-12 — End: 2015-10-13

## 2015-10-12 MED ORDER — IPRATROPIUM-ALBUTEROL 0.5-2.5 (3) MG/3ML IN SOLN
3.0000 mL | Freq: Three times a day (TID) | RESPIRATORY_TRACT | Status: DC
  Administered 2015-10-13: 3 mL via RESPIRATORY_TRACT
  Filled 2015-10-12 (×2): qty 3

## 2015-10-12 MED ORDER — METHYLPREDNISOLONE SODIUM SUCC 125 MG IJ SOLR
125.0000 mg | Freq: Two times a day (BID) | INTRAMUSCULAR | Status: DC
  Administered 2015-10-12: 125 mg via INTRAVENOUS
  Filled 2015-10-12: qty 2

## 2015-10-12 MED ORDER — INSULIN GLARGINE 100 UNIT/ML ~~LOC~~ SOLN
18.0000 [IU] | Freq: Every day | SUBCUTANEOUS | Status: DC
  Administered 2015-10-12: 18 [IU] via SUBCUTANEOUS
  Filled 2015-10-12 (×2): qty 0.18

## 2015-10-12 MED ORDER — DEXTROSE 5 % IV SOLN
1.0000 g | Freq: Once | INTRAVENOUS | Status: AC
  Administered 2015-10-12: 1 g via INTRAVENOUS
  Filled 2015-10-12: qty 10

## 2015-10-12 MED ORDER — FUROSEMIDE 10 MG/ML IJ SOLN
20.0000 mg | Freq: Once | INTRAMUSCULAR | Status: AC
  Administered 2015-10-12: 20 mg via INTRAVENOUS
  Filled 2015-10-12: qty 2

## 2015-10-12 MED ORDER — ALBUTEROL (5 MG/ML) CONTINUOUS INHALATION SOLN
10.0000 mg/h | INHALATION_SOLUTION | RESPIRATORY_TRACT | Status: DC
  Administered 2015-10-12: 10 mg/h via RESPIRATORY_TRACT
  Filled 2015-10-12: qty 20

## 2015-10-12 MED ORDER — ALBUTEROL SULFATE (2.5 MG/3ML) 0.083% IN NEBU
2.5000 mg | INHALATION_SOLUTION | Freq: Once | RESPIRATORY_TRACT | Status: DC
  Administered 2015-10-12: 2.5 mg via RESPIRATORY_TRACT

## 2015-10-12 MED ORDER — ONDANSETRON HCL 4 MG PO TABS
4.0000 mg | ORAL_TABLET | Freq: Four times a day (QID) | ORAL | Status: DC | PRN
Start: 2015-10-12 — End: 2015-10-14

## 2015-10-12 MED ORDER — ALBUTEROL SULFATE (2.5 MG/3ML) 0.083% IN NEBU
5.0000 mg | INHALATION_SOLUTION | Freq: Once | RESPIRATORY_TRACT | Status: AC
  Administered 2015-10-12: 5 mg via RESPIRATORY_TRACT
  Filled 2015-10-12: qty 6

## 2015-10-12 NOTE — ED Notes (Signed)
Re-paged x4 to [phone]

## 2015-10-12 NOTE — Assessment & Plan Note (Signed)
I question compliance of medications while she was in [place] her daughter also states that she didn't she forgets to take her shot. At any rate her A1c is now uncontrolled in the setting of hyponatremia

## 2015-10-12 NOTE — Progress Notes (Signed)
Called for CBG of 481. Pt had not yet received any insulin or solumedrol. Will give lantus 18 units, as well as 6 units of novolog at this time, nurse to recheck CBG in 90 mins. Changed daily SSI to moderate.

## 2015-10-12 NOTE — Assessment & Plan Note (Signed)
Typically fluid restricted however in [place] unclear what was done

## 2015-10-12 NOTE — ED Triage Notes (Signed)
Per EMS pt from PCP to be evaluated for shortness of breath with audible expiratory wheezing throughout. Patient came back from [place] this weekend and has been complaining of worsening shortness of breath since Monday. Patient went to PCP and rx albuterol inhaler and oral steroids and came in today feeling worse for re-check.     Patient received a 2.5mg  albuterol neb tx at PCP. EMS gave 10mg  of albuterol and 1mg  of atrovent neb treatment and 125mg  IV solumederol.    22G rt hand

## 2015-10-12 NOTE — ED Notes (Signed)
Family at bedside. 

## 2015-10-12 NOTE — ED Notes (Signed)
MD at bedside. Interpretor Ipad used to communicate with patient.

## 2015-10-12 NOTE — Assessment & Plan Note (Signed)
I think we are dealing with diastolic heart failure this may just be causing some of the wheezing she also has underlying pulmonary hypertension as well. Her BNP is elevated at 208 I only gave her a small dose of Lasix however thinks she's been any further diuresis with careful attention to her electrolytes which is best served in a hospital setting at her age.

## 2015-10-12 NOTE — Addendum Note (Signed)
Addended by: Sheral Flow on: 10/12/2015 04:21 PM   Modules accepted: Orders

## 2015-10-12 NOTE — Patient Instructions (Signed)
To ER F/U pending hospital

## 2015-10-12 NOTE — ED Provider Notes (Signed)
[facility] DEPT Provider Note   CSN: [id number] Arrival date & time: 10/12/15  1330     History   Chief Complaint Chief Complaint  Patient presents with  . Shortness of Breath    HPI Joyce Gilmore is a 81 y.o. female.  HPI Patient's had 3 weeks of progressive shortness of breath characterized by dyspnea with exertion. She also has had increased wheezing and cough productive of yellow sputum. She denies chest pain. She has lower extremity swelling but this is unchanged. Recently seen by her primary physician and started on prednisone, albuterol inhaler and antibiotics for bronchitis. Reevaluated in the office today and sent to the emergency department for worsening shortness of breath. Was given 125 mg solumedrol and breathing treatment en route. Patient states she had mild improvement of symptoms. Past Medical History:  Diagnosis Date  . Arthritis   . Chronic kidney disease    hx of kidney stones,   . Depression    hx of   . Diabetes mellitus without complication ([place])   . Ganglion, left ankle and foot   . Hearing loss of both ears   . Hypertension   . MRSA infection    Oct 13 - Nov 13  . Osteoporosis   . Tinnitus of both ears     Patient Active Problem List   Diagnosis Date Noted  . Acute on chronic diastolic heart failure ([place]) 10/12/2015  . SOB (shortness of breath) 10/12/2015  . Shortness of breath 10/12/2015  . Seasonal allergies 11/21/2014  . DDD (degenerative disc disease), lumbar 10/11/2014  . H/O compression fracture of spine 10/11/2014  . Pulmonary hypertension ([place])   . Congestive heart disease ([place])   . Other cirrhosis of liver ([place])   . Prosthetic hip infection ([place])   . Emphysematous pyelitis 09/24/2014  . Hepatic cirrhosis ([place]) 09/24/2014  . Metabolic acidosis 09/32/3557  . SIADH (syndrome of inappropriate ADH production) ([place]) 06/13/2014  . Abdominal pain, epigastric 01/11/2014  . Insomnia 12/28/2013  . H. pylori infection 12/08/2013  .  Anemia 07/06/2013  . DKA (diabetic ketoacidoses) ([place]) 06/27/2013  . HLD (hyperlipidemia) 06/27/2013  . Osteoporosis 07/26/2012  . Hyponatremia 05/16/2011  . Hyperkalemia 05/16/2011  . Diabetes mellitus type II, controlled ([place]) 08/17/2006  . Essential hypertension 08/17/2006  . DIVERTICULOSIS, COLON 08/17/2006    Past Surgical History:  Procedure Laterality Date  . BACK SURGERY     history restored after error with record merge      . INCISION AND DRAINAGE HIP  12/22/2011   Procedure: IRRIGATION AND DEBRIDEMENT HIP;  Surgeon: Mcarthur Rossetti, MD;  Location: [facility];  Service: Orthopedics;  Laterality: Right;  Irrigation and debridement right hip  . INCISION AND DRAINAGE HIP  12/27/2011   Procedure: IRRIGATION AND DEBRIDEMENT HIP;  Surgeon: Mcarthur Rossetti, MD;  Location: [facility];  Service: Orthopedics;  Laterality: Right;  Repeat irrigation and debridement  . JOINT REPLACEMENT     bilateral knee, right hip   . TOTAL HIP REVISION  12/05/2011   Procedure: TOTAL HIP REVISION;  Surgeon: Mcarthur Rossetti, MD;  Location: [facility];  Service: Orthopedics;  Laterality: Right;  Right Hip Revision Arthroplasty to Total Hip, Excision of Old Implant    OB History    No data available       Home Medications    Prior to Admission medications   Medication Sig Start Date End Date Taking? Authorizing Provider  albuterol (PROVENTIL HFA;VENTOLIN HFA) 108 (90 Base) MCG/ACT inhaler Inhale 1-2  puffs into the lungs every 4 (four) hours as needed for wheezing or shortness of breath. 10/10/15  Yes Alycia Rossetti, MD  amLODipine (NORVASC) 5 MG tablet Take 1 tablet (5 mg total) by mouth daily. 08/08/15  Yes Alycia Rossetti, MD  Calcium Carbonate-Vitamin D (CALCIUM-D PO) Take 1 tablet by mouth daily.   Yes Historical Provider, MD  insulin aspart (NOVOLOG FLEXPEN) 100 UNIT/ML FlexPen FOLLOW SLIDING SCALE AS DIRECTED Patient taking differently: Inject 3 Units into the skin 3 (three) times  daily with meals.  08/08/15  Yes Alycia Rossetti, MD  Insulin Glargine (LANTUS SOLOSTAR) 100 UNIT/ML Solostar Pen Inject 36 Units into the skin at bedtime. 08/08/15  Yes Alycia Rossetti, MD  lansoprazole (PREVACID) 30 MG capsule TAKE 1 CAPSULE BY MOUTH TWICE DAILY BEFORE A MEAL Patient taking differently: Take 30 mg by mouth 2 (two) times daily before a meal.  08/08/15  Yes Alycia Rossetti, MD  lisinopril (PRINIVIL,ZESTRIL) 10 MG tablet Take 1 tablet (10 mg total) by mouth daily. 08/08/15  Yes Alycia Rossetti, MD  loratadine (CLARITIN) 10 MG tablet Take 1 tablet (10 mg total) by mouth daily. For allergies 08/08/15  Yes Alycia Rossetti, MD  metoprolol tartrate (LOPRESSOR) 25 MG tablet Take 1 tablet (25 mg total) by mouth 2 (two) times daily. 08/08/15  Yes Alycia Rossetti, MD  pravastatin (PRAVACHOL) 20 MG tablet Take 1 tablet (20 mg total) by mouth daily. 08/08/15  Yes Alycia Rossetti, MD  sucralfate (CARAFATE) 1 g tablet Take 1 tablet (1 g total) by mouth 4 (four) times daily. 08/08/15  Yes Alycia Rossetti, MD  ACCU-CHEK AVIVA PLUS test strip USE TO CHECK BLOOD SUGAR FOUR TIMES DAILY AS DIRECTED 01/15/15   Alycia Rossetti, MD  ACCU-CHEK SOFTCLIX LANCETS lancets USE AS DIRECTED 08/08/15   Alycia Rossetti, MD  B-D ULTRAFINE III SHORT PEN 31G X 8 MM MISC USE AS DIRECTED 07/05/15   Alycia Rossetti, MD  benzonatate (TESSALON) 100 MG capsule TAKE 1 CAPSULE(100 MG) BY MOUTH TWICE DAILY AS NEEDED FOR COUGH Patient not taking: Reported on 10/12/2015 10/10/15   Alycia Rossetti, MD  blood glucose meter kit and supplies KIT Dispense based on patient and insurance preference. Use up to four times daily as directed. (FOR ICD-9 250.00, 250.01). 04/25/14   Orlena Sheldon, PA-C  furosemide (LASIX) 20 MG tablet Take 1 tablet (20 mg total) by mouth daily. Patient not taking: Reported on 10/12/2015 10/10/15   Alycia Rossetti, MD    Family History Family History  Problem Relation Age of Onset  . Hypertension Mother   .  Colon cancer Neg Hx     Social History Social History  Substance Use Topics  . Smoking status: Never Smoker  . Smokeless tobacco: Never Used  . Alcohol use No     Allergies   Bactrim [sulfamethoxazole-trimethoprim]; Rifampin; and Vancomycin   Review of Systems Review of Systems  Constitutional: Positive for fatigue. Negative for chills and fever.  Respiratory: Positive for cough, shortness of breath and wheezing.   Cardiovascular: Positive for leg swelling. Negative for chest pain.  Gastrointestinal: Negative for abdominal pain, constipation, diarrhea, nausea and vomiting.  Musculoskeletal: Negative for back pain, myalgias, neck pain and neck stiffness.  Neurological: Negative for dizziness, weakness, light-headedness, numbness and headaches.  All other systems reviewed and are negative.    Physical Exam Updated Vital Signs BP (!) 125/50 (BP Location: Left Arm)   Pulse 73  Temp 98.4 F (36.9 C) (Oral)   Resp 18   Ht 5' (1.524 m)   Wt 167 lb 3.2 oz (75.8 kg)   SpO2 95%   BMI 32.65 kg/m   Physical Exam  Constitutional: She is oriented to person, place, and time. She appears well-developed and well-nourished. She appears distressed.  HENT:  Head: Normocephalic and atraumatic.  Mouth/Throat: Oropharynx is clear and moist.  Eyes: EOM are normal. Pupils are equal, round, and reactive to light.  Neck: Normal range of motion. Neck supple. No JVD present.  Cardiovascular: Normal rate and regular rhythm.   Pulmonary/Chest: Effort normal. She has wheezes. She has rales.  Patient with basilar crackles and diffuse expiratory wheezing. Increased work of breathing.  Abdominal: Soft. Bowel sounds are normal. There is no tenderness. There is no rebound and no guarding.  Musculoskeletal: Normal range of motion. She exhibits edema. She exhibits no tenderness.  1+ bilateral pretibial edema  Neurological: She is alert and oriented to person, place, and time.  Moves all extremities  without deficit. Sensation intact.  Skin: Skin is warm and dry. Capillary refill takes less than 2 seconds. No rash noted. No erythema.  Psychiatric: She has a normal mood and affect. Her behavior is normal.  Nursing note and vitals reviewed.    ED Treatments / Results  Labs (all labs ordered are listed, but only abnormal results are displayed) Labs Reviewed  BRAIN NATRIURETIC PEPTIDE - Abnormal; Notable for the following:       Result Value   B Natriuretic Peptide 246.9 (*)    All other components within normal limits  COMPREHENSIVE METABOLIC PANEL - Abnormal; Notable for the following:    Sodium 125 (*)    Chloride 93 (*)    Glucose, Bld 162 (*)    GFR calc non Af Amer 55 (*)    All other components within normal limits  BASIC METABOLIC PANEL - Abnormal; Notable for the following:    Sodium 122 (*)    Potassium 5.2 (*)    Chloride 91 (*)    CO2 19 (*)    Glucose, Bld 570 (*)    BUN 26 (*)    Creatinine, Ser 1.72 (*)    Calcium 8.6 (*)    GFR calc non Af Amer 27 (*)    GFR calc Af Amer 31 (*)    All other components within normal limits  CBC - Abnormal; Notable for the following:    Hemoglobin 11.2 (*)    HCT 34.8 (*)    All other components within normal limits  GLUCOSE, CAPILLARY - Abnormal; Notable for the following:    Glucose-Capillary 481 (*)    All other components within normal limits  GLUCOSE, CAPILLARY - Abnormal; Notable for the following:    Glucose-Capillary >600 (*)    All other components within normal limits  BASIC METABOLIC PANEL - Abnormal; Notable for the following:    Sodium 125 (*)    Chloride 92 (*)    Glucose, Bld 355 (*)    BUN 25 (*)    Creatinine, Ser 1.50 (*)    GFR calc non Af Amer 32 (*)    GFR calc Af Amer 37 (*)    All other components within normal limits  GLUCOSE, CAPILLARY - Abnormal; Notable for the following:    Glucose-Capillary 574 (*)    All other components within normal limits  GLUCOSE, CAPILLARY - Abnormal; Notable for  the following:    Glucose-Capillary 409 (*)  All other components within normal limits  GLUCOSE, CAPILLARY - Abnormal; Notable for the following:    Glucose-Capillary 311 (*)    All other components within normal limits  MRSA PCR SCREENING  CBC  LEGIONELLA PNEUMOPHILA SEROGP 1 UR AG  STREP PNEUMONIAE URINARY ANTIGEN  I-STAT TROPOININ, ED    EKG  EKG Interpretation  Date/Time:  Friday October 12 2015 13:39:10 EDT Ventricular Rate:  73 PR Interval:    QRS Duration: 103 QT Interval:  373 QTC Calculation: 411 R Axis:   38 Text Interpretation:  Sinus rhythm Low voltage, precordial leads Confirmed by Lita Mains  MD, Sumaiya Arruda ([id number]) on 10/12/2015 2:03:19 PM       Radiology Dg Chest Port 1 View  Result Date: 10/12/2015 CLINICAL DATA:  Shortness of breath, audible expiratory wheezing, productive cough, returned from [place] this past weekend, shortness of breath since Monday, hypertension, diabetes mellitus EXAM: PORTABLE CHEST 1 VIEW COMPARISON:  Portable exam 1432 hours compared to 10/10/2015 FINDINGS: Enlargement of cardiac silhouette. Mediastinal contours and pulmonary vascularity normal. Minimal bibasilar atelectasis. Lungs otherwise clear. No pleural effusion or pneumothorax. Bones unremarkable. IMPRESSION: Enlargement of cardiac silhouette with minimal bibasilar atelectasis. Electronically Signed   By: Lavonia Dana M.D.   On: 10/12/2015 15:00    Procedures Procedures (including critical care time)  Medications Ordered in ED Medications  benzonatate (TESSALON) capsule 100 mg (not administered)  amLODipine (NORVASC) tablet 5 mg (not administered)  metoprolol tartrate (LOPRESSOR) tablet 25 mg (25 mg Oral Given 10/12/15 2308)  pravastatin (PRAVACHOL) tablet 20 mg (not administered)  sucralfate (CARAFATE) tablet 1 g (1 g Oral Given 10/12/15 2307)  heparin injection 5,000 Units (5,000 Units Subcutaneous Given 10/13/15 0515)  acetaminophen (TYLENOL) tablet 650 mg (not administered)    Or   acetaminophen (TYLENOL) suppository 650 mg (not administered)  polyethylene glycol (MIRALAX / GLYCOLAX) packet 17 g (not administered)  ondansetron (ZOFRAN) tablet 4 mg (not administered)    Or  ondansetron (ZOFRAN) injection 4 mg (not administered)  insulin glargine (LANTUS) injection 18 Units (18 Units Subcutaneous Given 10/12/15 2307)  insulin aspart (novoLOG) injection 0-5 Units (0 Units Subcutaneous Not Given 10/12/15 2200)  ipratropium-albuterol (DUONEB) 0.5-2.5 (3) MG/3ML nebulizer solution 3 mL (not administered)  insulin aspart (novoLOG) injection 0-15 Units (not administered)  ipratropium-albuterol (DUONEB) 0.5-2.5 (3) MG/3ML nebulizer solution 3 mL (3 mLs Nebulization Given 10/13/15 0343)  albuterol (PROVENTIL) (2.5 MG/3ML) 0.083% nebulizer solution 5 mg (5 mg Nebulization Given 10/12/15 1400)  azithromycin (ZITHROMAX) 500 mg in dextrose 5 % 250 mL IVPB (500 mg Intravenous New Bag/Given 10/12/15 1804)  cefTRIAXone (ROCEPHIN) 1 g in dextrose 5 % 50 mL IVPB (0 g Intravenous Stopped 10/12/15 1803)  furosemide (LASIX) injection 20 mg (20 mg Intravenous Given 10/12/15 2307)  insulin aspart (novoLOG) injection 6 Units (6 Units Subcutaneous Given 10/12/15 2300)  insulin aspart (novoLOG) injection 10 Units (10 Units Subcutaneous Given 10/13/15 0116)  insulin aspart (novoLOG) injection 10 Units (10 Units Subcutaneous Given 10/13/15 0317)  insulin aspart (novoLOG) injection 6 Units (6 Units Subcutaneous Given 10/13/15 0515)     Initial Impression / Assessment and Plan / ED Course  I have reviewed the triage vital signs and the nursing notes.  Pertinent labs & imaging results that were available during my care of the patient were reviewed by me and considered in my medical decision making (see chart for details).  Clinical Course   Pt with some improvement with breathing treatment and steroids. Covered with antibiotics. Will admit to medicine service.  Final Clinical Impressions(s) / ED  Diagnoses   Final diagnoses:  None    New Prescriptions Current Discharge Medication List       Julianne Rice, MD 10/13/15 [phone]

## 2015-10-12 NOTE — Progress Notes (Signed)
Subjective:    Patient ID: Joyce Gilmore, female    DOB: 1934/10/14, 81 y.o.   MRN: [id number]  Patient presents for Follow-up (productive cough)  Patient here for interim follow-up. She was seen 2 days ago with shortness of breath or wheezing cough congestion. She had returned from [place] the day before.  My diagnosis 2 days ago were diastolic dysfunction she had echocardiogram done in 2016 her weight also noted to be up I gave her Lasix 20 mg her BNP came back elevated at 208 Her chest x-ray did not show any significant volume overload more atelectasis on Wednesday. She still has significant swelling of her weight is down 2 pounds she also has increased work of breathing very labored very wheezy.  My second diagnosis was bronchitis which she has had multiple times. She was given neb which she did improve in the office 2 days ago she was sent home with prednisone as well as albuterol and antibiotic and she recently returned from [place]. She's not had any fever but there's been no improvement in her breathing   She's history of hyponatremia mostly from over ingestion of water which this has been worked up in the past. Her sodium returned at 125 however in the setting of hyperglycemia her A1c has increased to 9.5%, blood glucose was 233 creatinine 1.12 liver function was stable     Review Of Systems:  GEN- denies fatigue, fever, weight loss,weakness, recent illness HEENT- denies eye drainage, change in vision, nasal discharge, CVS- denies chest pain, palpitations RESP- + SOB,+ cough, +wheeze ABD- denies N/V, change in stools, abd pain GU- denies dysuria, hematuria, dribbling, incontinence MSK- denies joint pain, muscle aches, injury Neuro- denies headache, dizziness, syncope, seizure activity       Objective:    BP 134/60 (BP Location: Left Arm, Patient Position: Sitting, Cuff Size: Normal)   Pulse (!) 58   Temp 98.5 F (36.9 C) (Oral)   Resp (!) 22   Ht 4\' 11"  (1.499 m)   Wt  164 lb (74.4 kg)   SpO2 96% Comment: RA  BMI 33.12 kg/m  GEN- , alert and oriented x3 ,weight down 2lbs ,labored breathing  HEENT- PERRL, EOMI, non injected sclera, pink conjunctiva, MMM, oropharynx clear Neck- Supple, + JVD  CVS- RRR, no murmur RESP-bilat wheeze, mild crackles ,audible wheezing, decreased air movement, normal oxygen sat ABD-NABS,soft,NT,ND EXT- 1+ pitting  Edema to shins  Pulses- Radial  2+       Assessment & Plan:    Discussed with daughter, with worsening symptoms and age, recent return from [place] she needs admission. She also requires spainish interpreter Will send to ER via ambulance, place on 1 L of oxygen as her sats are not that bad    Problem List Items Addressed This Visit    SIADH (syndrome of inappropriate ADH production) ([place])    Typically fluid restricted however in [place] unclear what was done      Hyponatremia   Diabetes mellitus type II, controlled ([place]) - Primary    I question compliance of medications while she was in [place] her daughter also states that she didn't she forgets to take her shot. At any rate her A1c is now uncontrolled in the setting of hyponatremia      Acute on chronic diastolic heart failure ([place])    I think we are dealing with diastolic heart failure this may just be causing some of the wheezing she also has underlying pulmonary hypertension as well. Her  BNP is elevated at 208 I only gave her a small dose of Lasix however thinks she's been any further diuresis with careful attention to her electrolytes which is best served in a hospital setting at her age.       Other Visit Diagnoses    Acute bronchitis, unspecified organism       concern she has also some underlying lung pathology, plan is to get PFT once she improves from this acute illness      Note: This dictation was prepared with Dragon dictation along with smaller phrase technology. Any transcriptional errors that result from this process are unintentional.

## 2015-10-12 NOTE — H&P (Signed)
[facility] Admission History and Physical Service Pager: [phone]  Patient name: Joyce Gilmore Medical record number: [id number] Date of birth: 1934/12/11 Age: 81 y.o. Gender: female  Primary Care Provider: Vic Blackbird, MD Consultants: None Code Status:  FULL  Chief Complaint: Shortness of breath  Assessment and Plan: Joyce Gilmore is a 81 y.o. female presenting with dyspnea and cough productive of yellow sputum. PMH is significant for HFpEF,  Pulmonary hypertension, T2DM, HTN, osteoporosis, HLD, diverticulosis, SIADH and hyponatremia, Hepatic cirrhosis, degenerative disc disease and emphysematous pyelitis.  #Acute respiratory distress - Patient with wheezing throughout all lung fields and requiring multiple duoneb treatments in the ED, saturating above 91% on RA and with cough productive of yellow, nonbloody sputum.  Presentation is most concerning for acute asthma/COPD exacerbation, given her diffuse wheezing on exam, however, she has never had PFTs performed. She could also have acute bronchitis, given her history of multiple episodes of acute bronchitis in the past, however her lung exam is not consistent with bronchitis. Pt with no known history of lung disease other than multiple episodes of bronchitis and history of pulmonary hypertension. Other considerations are CAP given recent travel history and productive cough, but no fevers, no leukocytosis, and no infiltrate on CXR make this less likely. CHF exacerbation is also on the differential given history of HFpEF and BNP elevated at 246.9 with 1+ edema in LE bilaterally. Her weight is 164lb today, and was 157lb in 04/2015 (unclear what her true dry weight is). Her last ECHO on 09/25/14 showed EF 60-65%, G1DD, and PA peak pressure of 46mHg.  PE must also be considered [place] however considered unlikely given no unilateral leg swelling and has not been tachycardic, no CP, no new  oxygen requriement. It also sounds like her chest pain [place] during [place]. Unlikely cardiac etiology, given no CP, I-stat troponin negative, and EKG is unchanged from previous. - Admit to [facility]. WMingo Amber- Given Ceftriaxone and Azithromycin in the ED. Will not continue at this point, because low suspicion for CAP. Restart if she develops fevers or clinically declines. - Supplemental O2 as needed to keep O2 sats > 92%, currently on room air - Solumedrol '125mg'$  q12hrs given her significant wheezing - scheduled Duonebs q4h, tessalon pearls PRN -  Legionella urinary antigen, strep pneumoniae urinary antigen -  Lasix '20mg'$  IV x 1, reassess volume status in the morning - daily weights, strict Is&Os - saline lock - Consider ECHO, but can likely get this as an outpatient - will need outpatient PFTs  #Hyponatremia, chronic -  Na 125 on this admission, may be 2/2 legionella infection vs reported history of SIADH in the chart.  Glucose 162 (corrected sodium 126).  Historically low sodium in the low to mid 120s, although sodium normal in March 2017. - follow up BMP - Fluid restrict - Holding home Lisinopril, which can exacerbate hyponatremia  #T2DM - Poorly controlled with A1C 10/10/2015 was 9.5.  Patient on lantus 36u qhs and novolog sliding scale at home.  - Lantus 18u and Novolog SSI (moderate) - follow up CBGs, adjust Lantus as necessary  #h/o Emphysematous pyelitis - patient with one functional kidney per history.  Cr 0.97, apparently baseline.   - monitor daily BMP especially in the setting of diuresis  #HTN - patient with some elevated pressures in the ED to 170/80, improved to 137/47.  - continue home norvasc 5 mg,  metoprolol 25 mg BID - Holding home Lisinopril as above  #HLD - Stable. Patient without chest pain. Most recent lipid panel in 04/2015, WNL. - continue pravastatin 20 mg daily   FEN/GI: Renal/Carb modified diet with fluid  restriction Prophylaxis: heparin  Disposition: Home pending clinical improvement, patient is Obs  History of Present Illness:  Joyce Gilmore is a 81 y.o. female presenting with shortness of breath.  The patient reports recent travel three days ago from [place], where she had been living for the past two months. She was in her usual state of health until three weeks ago when she began noticing shortness of breath that she states came on suddenly, particularly with walking. She noticed an increase in lower extremity edema at this time as well. Then, four days ago, she developed cough productive of yellow sputum and wheezing.  She was seen at her PCP's office on Wednesday, and at which point presentation noted to be concerning for bronchitis vs CHF exacerbation as she had increased LE swelling. She was given 20 mg PO Lasix, Prednisone, and Albuterol at that time. The patient's respiratory status continued to worsen despite interventions, and she was seen again by PCP today who recommended she come in for closer monitoring in the hospital.  Of note, the patient has no history of lung disease. No sick contacts. In March when she was going to go to [place], she was having some breathing problems. Her PCP gave her an inhaler. Per daughter, she was told to use this medication every 4 hours, but she was not doing this. She endorses chest pain with walking three weeks ago, but denies chest pain right now. Shortness of breath is worse with walking. She endorses orthopnea, but only sleeps on one pillow. No PND.  No fevers, no chills, no nausea, no vomiting, no constipation.  She lives at home with her daughter. She takes care of her ADLs independently. She uses a cane to ambulate at home.  In the ED, she appeared mildly short of breath and was given a continuous Albuterol nebulizer. She had good O2 saturations on room air. Labs were significant for Na 125, BNP 247, troponin 0.01. EKG was unchanged from previous. CXR  did not show any signs of pneumonia. She was given Ceftriaxone and Azithromycin for presumed CAP and she was admitted for observation.  Review Of Systems: Per HPI  Otherwise the remainder of the systems were negative.  Patient Active Problem List   Diagnosis Date Noted  . Acute on chronic diastolic heart failure ([place]) 10/12/2015  . SOB (shortness of breath) 10/12/2015  . Shortness of breath 10/12/2015  . Seasonal allergies 11/21/2014  . DDD (degenerative disc disease), lumbar 10/11/2014  . H/O compression fracture of spine 10/11/2014  . Pulmonary hypertension ([place])   . Congestive heart disease ([place])   . Other cirrhosis of liver ([place])   . Prosthetic hip infection ([place])   . Emphysematous pyelitis 09/24/2014  . Hepatic cirrhosis ([place]) 09/24/2014  . Metabolic acidosis 67/01/4579  . SIADH (syndrome of inappropriate ADH production) ([place]) 06/13/2014  . Abdominal pain, epigastric 01/11/2014  . Insomnia 12/28/2013  . H. pylori infection 12/08/2013  . Anemia 07/06/2013  . DKA (diabetic ketoacidoses) ([place]) 06/27/2013  . HLD (hyperlipidemia) 06/27/2013  . Osteoporosis 07/26/2012  . Hyponatremia 05/16/2011  . Hyperkalemia 05/16/2011  . Diabetes mellitus type II, controlled ([place]) 08/17/2006  . Essential hypertension 08/17/2006  . DIVERTICULOSIS, COLON 08/17/2006    Past Medical History: Past Medical History:  Diagnosis Date  .  Arthritis   . Chronic kidney disease    hx of kidney stones,   . Depression    hx of   . Diabetes mellitus without complication ([place])   . Ganglion, left ankle and foot   . Hearing loss of both ears   . Hypertension   . MRSA infection    Oct 13 - Nov 13  . Osteoporosis   . Tinnitus of both ears     Past Surgical History: Past Surgical History:  Procedure Laterality Date  . BACK SURGERY     history restored after error with record merge      . INCISION AND DRAINAGE HIP  12/22/2011   Procedure: IRRIGATION AND DEBRIDEMENT HIP;  Surgeon: Mcarthur Rossetti, MD;  Location: [facility];  Service: Orthopedics;  Laterality: Right;  Irrigation and debridement right hip  . INCISION AND DRAINAGE HIP  12/27/2011   Procedure: IRRIGATION AND DEBRIDEMENT HIP;  Surgeon: Mcarthur Rossetti, MD;  Location: [facility];  Service: Orthopedics;  Laterality: Right;  Repeat irrigation and debridement  . JOINT REPLACEMENT     bilateral knee, right hip   . TOTAL HIP REVISION  12/05/2011   Procedure: TOTAL HIP REVISION;  Surgeon: Mcarthur Rossetti, MD;  Location: [facility];  Service: Orthopedics;  Laterality: Right;  Right Hip Revision Arthroplasty to Total Hip, Excision of Old Implant    Social History: Social History  Substance Use Topics  . Smoking status: Never Smoker  . Smokeless tobacco: Never Used  . Alcohol use No   Additional social history: Denies drug use. Please also refer to relevant sections of EMR.  Family History: Family History  Problem Relation Age of Onset  . Hypertension Mother   . Colon cancer Neg Hx     Allergies and Medications: Allergies  Allergen Reactions  . Bactrim [Sulfamethoxazole-Trimethoprim]     Hyperkalemia  . Rifampin Other (See Comments)    Severe thrombocytopenia  . Vancomycin Other (See Comments)    Severe thrombocytopenia   No current facility-administered medications on file prior to encounter.    Current Outpatient Prescriptions on File Prior to Encounter  Medication Sig Dispense Refill  . albuterol (PROVENTIL HFA;VENTOLIN HFA) 108 (90 Base) MCG/ACT inhaler Inhale 1-2 puffs into the lungs every 4 (four) hours as needed for wheezing or shortness of breath. 18 Inhaler 1  . amLODipine (NORVASC) 5 MG tablet Take 1 tablet (5 mg total) by mouth daily. 90 tablet 3  . insulin aspart (NOVOLOG FLEXPEN) 100 UNIT/ML FlexPen FOLLOW SLIDING SCALE AS DIRECTED (Patient taking differently: Inject 3 Units into the skin 3 (three) times daily with meals. ) 15 mL 3  . Insulin Glargine (LANTUS SOLOSTAR) 100 UNIT/ML Solostar Pen  Inject 36 Units into the skin at bedtime. 15 pen 3  . lansoprazole (PREVACID) 30 MG capsule TAKE 1 CAPSULE BY MOUTH TWICE DAILY BEFORE A MEAL (Patient taking differently: Take 30 mg by mouth 2 (two) times daily before a meal. ) 180 capsule 3  . lisinopril (PRINIVIL,ZESTRIL) 10 MG tablet Take 1 tablet (10 mg total) by mouth daily. 90 tablet 3  . loratadine (CLARITIN) 10 MG tablet Take 1 tablet (10 mg total) by mouth daily. For allergies 90 tablet 11  . metoprolol tartrate (LOPRESSOR) 25 MG tablet Take 1 tablet (25 mg total) by mouth 2 (two) times daily. 180 tablet 3  . pravastatin (PRAVACHOL) 20 MG tablet Take 1 tablet (20 mg total) by mouth daily. 90 tablet 3  . sucralfate (CARAFATE) 1 g  tablet Take 1 tablet (1 g total) by mouth 4 (four) times daily. 360 tablet 3  . ACCU-CHEK AVIVA PLUS test strip USE TO CHECK BLOOD SUGAR FOUR TIMES DAILY AS DIRECTED 100 each 11  . ACCU-CHEK SOFTCLIX LANCETS lancets USE AS DIRECTED 200 each 0  . B-D ULTRAFINE III SHORT PEN 31G X 8 MM MISC USE AS DIRECTED 100 each 11  . benzonatate (TESSALON) 100 MG capsule TAKE 1 CAPSULE(100 MG) BY MOUTH TWICE DAILY AS NEEDED FOR COUGH (Patient not taking: Reported on 10/12/2015) 20 capsule 0  . blood glucose meter kit and supplies KIT Dispense based on patient and insurance preference. Use up to four times daily as directed. (FOR ICD-9 250.00, 250.01). 1 each 0  . furosemide (LASIX) 20 MG tablet Take 1 tablet (20 mg total) by mouth daily. (Patient not taking: Reported on 10/12/2015) 30 tablet 0    Objective: BP (!) 137/47 (BP Location: Right Arm)   Pulse 80   Temp 97.9 F (36.6 C) (Oral)   Resp 16   Ht 5' (1.524 m)   Wt 167 lb 3.2 oz (75.8 kg)   SpO2 96%   BMI 32.65 kg/m  Exam: General: Patient rests comfortably in bed with nebulizer treatment in process, no apparent distress Eyes: PERRL, EOMI, no conjunctival palor or injection ENTM: MMM, no pharyngeal erythema or exudate Neck: ROM, no thyromegaly, no cervical  lymphadenopathy Cardiovascular: RRR, no m/r/g, no carotid bruits, palpable pulses in distal extremities  Respiratory: +impressive wheezing throughout all lung fields, no rhonchi or rales appreciated, no retractive breathing or use of accessory muscles, poor air movement throughout all lung fields Abdomen: soft, nontender, nondistended, normoactive BS, no hepatosplenomegaly MSK: full ROM in 4 extremities, 1+ pitting edema to mid shin bilaterally, negative homan's sign, no erythema, no tenderness to palpation of the calves bilaterally Skin: warm and well-perfused Neuro: CNII-XII grossly intact, no focal deficits Psych: alert and oriented  Labs and Imaging: CBC BMET   Recent Labs Lab 10/12/15 1342  WBC 8.8  HGB 12.3  HCT 37.7  PLT 171    Recent Labs Lab 10/12/15 1342  NA 125*  K 4.3  CL 93*  CO2 24  BUN 11  CREATININE 0.97  GLUCOSE 162*  CALCIUM 9.0       Everrett Coombe, MD 10/12/2015, 8:11 PM PGY-1, [place] Intern pager: [phone], text pages welcome   FPTS Upper-Level Resident Addendum  I have independently interviewed and examined the patient. I have discussed the above with Dr. Burr Medico and agree with their documentation. My edits for correction/addition/clarification are in blue. Please see also any attending notes.   Hyman Bible, MD PGY-2, [place] Service pager: [phone] (text pages welcome through [place])

## 2015-10-13 DIAGNOSIS — R0602 Shortness of breath: Secondary | ICD-10-CM

## 2015-10-13 LAB — BASIC METABOLIC PANEL
ANION GAP: 11 (ref 5–15)
Anion gap: 12 (ref 5–15)
BUN: 26 mg/dL — AB (ref 6–20)
BUN: 25 mg/dL — AB (ref 6–20)
CALCIUM: 8.6 mg/dL — AB (ref 8.9–10.3)
CALCIUM: 9.1 mg/dL (ref 8.9–10.3)
CO2: 19 mmol/L — ABNORMAL LOW (ref 22–32)
CO2: 22 mmol/L (ref 22–32)
Chloride: 91 mmol/L — ABNORMAL LOW (ref 101–111)
Chloride: 92 mmol/L — ABNORMAL LOW (ref 101–111)
Creatinine, Ser: 1.72 mg/dL — ABNORMAL HIGH (ref 0.44–1.00)
Creatinine, Ser: 1.5 mg/dL — ABNORMAL HIGH (ref 0.44–1.00)
GFR calc Af Amer: 31 mL/min — ABNORMAL LOW (ref >60)
GFR calc Af Amer: 37 mL/min — ABNORMAL LOW (ref >60)
GFR, EST NON AFRICAN AMERICAN: 27 mL/min — AB (ref >60)
GFR, EST NON AFRICAN AMERICAN: 32 mL/min — AB (ref >60)
GLUCOSE: 570 mg/dL — AB (ref 65–99)
GLUCOSE: 355 mg/dL — AB (ref 65–99)
Potassium: 5.2 mmol/L — ABNORMAL HIGH (ref 3.5–5.1)
Potassium: 4.5 mmol/L (ref 3.5–5.1)
Sodium: 122 mmol/L — ABNORMAL LOW (ref 135–145)
Sodium: 125 mmol/L — ABNORMAL LOW (ref 135–145)

## 2015-10-13 LAB — GLUCOSE, CAPILLARY
GLUCOSE-CAPILLARY: 246 mg/dL — AB (ref 65–99)
GLUCOSE-CAPILLARY: 297 mg/dL — AB (ref 65–99)
GLUCOSE-CAPILLARY: 314 mg/dL — AB (ref 65–99)
Glucose-Capillary: 574 mg/dL (ref 65–99)
Glucose-Capillary: 409 mg/dL — ABNORMAL HIGH (ref 65–99)
Glucose-Capillary: 311 mg/dL — ABNORMAL HIGH (ref 65–99)
Glucose-Capillary: 420 mg/dL — ABNORMAL HIGH (ref 65–99)
Glucose-Capillary: 278 mg/dL — ABNORMAL HIGH (ref 65–99)

## 2015-10-13 LAB — CBC
HCT: 34.8 % — ABNORMAL LOW (ref 36.0–46.0)
Hemoglobin: 11.2 g/dL — ABNORMAL LOW (ref 12.0–15.0)
MCH: 28.3 pg (ref 26.0–34.0)
MCHC: 32.2 g/dL (ref 30.0–36.0)
MCV: 87.9 fL (ref 78.0–100.0)
Platelets: 189 10*3/uL (ref 150–400)
RBC: 3.96 MIL/uL (ref 3.87–5.11)
RDW: 13 % (ref 11.5–15.5)
WBC: 5.9 10*3/uL (ref 4.0–10.5)

## 2015-10-13 LAB — STREP PNEUMONIAE URINARY ANTIGEN: STREP PNEUMO URINARY ANTIGEN: NEGATIVE

## 2015-10-13 LAB — MRSA PCR SCREENING: MRSA BY PCR: NEGATIVE

## 2015-10-13 MED ORDER — FUROSEMIDE 20 MG PO TABS
20.0000 mg | ORAL_TABLET | Freq: Every day | ORAL | Status: DC
  Administered 2015-10-13 – 2015-10-14 (×2): 20 mg via ORAL
  Filled 2015-10-13 (×2): qty 1

## 2015-10-13 MED ORDER — PREDNISONE 20 MG PO TABS
40.0000 mg | ORAL_TABLET | Freq: Every day | ORAL | Status: DC
  Administered 2015-10-13 – 2015-10-14 (×2): 40 mg via ORAL
  Filled 2015-10-13 (×2): qty 2

## 2015-10-13 MED ORDER — INSULIN ASPART 100 UNIT/ML ~~LOC~~ SOLN
0.0000 [IU] | Freq: Every day | SUBCUTANEOUS | Status: DC
  Administered 2015-10-13: 4 [IU] via SUBCUTANEOUS

## 2015-10-13 MED ORDER — INSULIN ASPART 100 UNIT/ML ~~LOC~~ SOLN
10.0000 [IU] | Freq: Once | SUBCUTANEOUS | Status: AC
  Administered 2015-10-13: 10 [IU] via SUBCUTANEOUS

## 2015-10-13 MED ORDER — IPRATROPIUM-ALBUTEROL 0.5-2.5 (3) MG/3ML IN SOLN
3.0000 mL | Freq: Four times a day (QID) | RESPIRATORY_TRACT | Status: DC | PRN
  Administered 2015-10-13: 3 mL via RESPIRATORY_TRACT

## 2015-10-13 MED ORDER — INSULIN ASPART 100 UNIT/ML ~~LOC~~ SOLN
0.0000 [IU] | Freq: Three times a day (TID) | SUBCUTANEOUS | Status: DC
  Administered 2015-10-13: 11 [IU] via SUBCUTANEOUS
  Administered 2015-10-13: 22 [IU] via SUBCUTANEOUS
  Administered 2015-10-13: 5 [IU] via SUBCUTANEOUS
  Administered 2015-10-14 (×2): 4 [IU] via SUBCUTANEOUS
  Administered 2015-10-14: 11 [IU] via SUBCUTANEOUS

## 2015-10-13 MED ORDER — ALBUTEROL SULFATE (2.5 MG/3ML) 0.083% IN NEBU: 2.5000 mg | INHALATION_SOLUTION | RESPIRATORY_TRACT | Status: DC | PRN

## 2015-10-13 MED ORDER — INSULIN GLARGINE 100 UNIT/ML ~~LOC~~ SOLN
36.0000 [IU] | Freq: Every day | SUBCUTANEOUS | Status: DC
  Administered 2015-10-13: 36 [IU] via SUBCUTANEOUS
  Filled 2015-10-13 (×2): qty 0.36

## 2015-10-13 MED ORDER — INSULIN ASPART 100 UNIT/ML ~~LOC~~ SOLN
6.0000 [IU] | Freq: Once | SUBCUTANEOUS | Status: AC
  Administered 2015-10-13: 6 [IU] via SUBCUTANEOUS

## 2015-10-13 MED ORDER — IPRATROPIUM-ALBUTEROL 0.5-2.5 (3) MG/3ML IN SOLN
3.0000 mL | RESPIRATORY_TRACT | Status: DC
  Administered 2015-10-13 – 2015-10-14 (×5): 3 mL via RESPIRATORY_TRACT
  Filled 2015-10-13 (×5): qty 3

## 2015-10-13 MED ORDER — IPRATROPIUM-ALBUTEROL 0.5-2.5 (3) MG/3ML IN SOLN: 3.0000 mL | Freq: Four times a day (QID) | RESPIRATORY_TRACT | Status: DC

## 2015-10-13 NOTE — Progress Notes (Signed)
Called for unreadably high CBG >600. Will give another 10 units of novolog and ordered stat BMP. If anion gap is closed, will continue to try to control blood sugars with SSI. If gap is open, will transfer for insulin drip. Pt mentating well and understands the plan.

## 2015-10-13 NOTE — Progress Notes (Addendum)
CRITICAL VALUE ALERT  Critical value received:  CBG 420   Date of notification:  10/13/15  Time of notification:  11:43  Critical value read back:No.  Nurse who received alert:  Rosalio Loud RN  MD notified (1st page):  Hyman Bible, MD  Time of first page:  11:46  Time of second page: 12:04  Time of response: 12:18  Per V.O. Give 22Units and recheck in one hour

## 2015-10-13 NOTE — Progress Notes (Signed)
Notified in regards to patient CBG being 481. See MAR for new orders. Will recheck in 90 min.

## 2015-10-13 NOTE — Progress Notes (Signed)
See MAR for new orders in regards to GBS.

## 2015-10-13 NOTE — Progress Notes (Signed)
Notified on call provider in regards to being unable to detect blood sugar on glucometer. Reading "HI". MD aware. Will continue to monitor. Awaiting new orders.

## 2015-10-13 NOTE — Progress Notes (Signed)
Family Medicine Teaching Service Daily Progress Note Intern Pager: [phone]  Patient name: Joyce Gilmore Medical record number: [id number] Date of birth: [birth year] Age: 81 y.o. Gender: female  Primary Care Provider: Vic Blackbird, MD Consultants: None Code Status: Full  Pt Overview and Major Events to Date:  8/18: Admitted to FMTS with shortness of breath  Assessment and Plan: Joyce Gilmore is a 81 y.o. female presenting with dyspnea and cough productive of yellow sputum. PMH is significant for HFpEF, Pulmonary hypertension, T2DM, HTN, osteoporosis, HLD, diverticulosis, SIADH and hyponatremia, Hepatic cirrhosis, degenerative disc disease and emphysematous pyelitis.  #Acute respiratory distress - Likely asthma exacerbation vs CHF exacerbation. Pt does not have a diagnosis of asthma but had diffuse wheezing on exam. Asthma exacerbation could be secondary to viral URI, given her rhinorrhea and cough. Wheezing and dyspnea much improved this morning. Now with crackles in the lung bases bilaterally. Lower extremity edema improved after Lasix. Output not measured overnight. O2 sats 92-96% on room air.  - Will not continue Ceftriaxone/Azithromycin at this point, because low suspicion for CAP. Restart if febrile or clinically declining. -  Legionella urinary antigen and strep pneumoniae urinary antigen pending - s/p Solumedrol 125mg  x 1. Will change to oral Prednisone 40mg  daily x 4 more days. - scheduled Duonebs tid and 6hrs prn, tessalon pearls PRN -  s/p Lasix 20mg  IV x 1. Switch to Lasix 20mg  PO daily (home dose). - Will order ECHO - daily weights, strict I/O - Supplemental O2 as needed to keep O2 sats > 92%, currently on room air - will need outpatient PFTs  #AKI: Elevated creatinine this morning. Cr 0.97 on admission > 1.72 > 1.50. Likely secondary to Lasix. - Switch to Lasix 20mg  PO daily - Daily BMETs - Holding home Lasix - Avoid nephrotoxic agents  #Hyponatremia, chronic -  Na  125 this admission, may be 2/2 legionella infection vs reported history of SIADH in the chart.  Glucose 162 (corrected sodium 126).  Historically low sodium in the low to mid 120s, although sodium normal in March 2017. - Trend BMETs - Fluid restrict - Holding home Lisinopril, which can exacerbate hyponatremia  #Hyperglycemia in T2DM - Poorly controlled with A1C 10/10/2015 was 9.5.  Patient on lantus 36u qhs and novolog sliding scale at home. Had hyperglycemia to >600 overnight, came down to 246 this morning after a few doses of Novolog. - Increase Lantus from 18 units to 36 units. - Increase SSI from moderate to resistant  #h/o Emphysematous pyelitis - patient with one functional kidney per history.  Cr 0.97, apparently baseline.   - monitor daily BMP especially in the setting of diuresis  #HTN, stable - BP 125/50 this morning - continue home norvasc 5 mg, metoprolol 25 mg BID - Holding home Lisinopril as above  #HLD - Stable. Patient without chest pain. Most recent lipid panel in 04/2015, WNL. - continue pravastatin 20 mg daily   FEN/GI: Renal/Carb modified diet with fluid restriction Prophylaxis: heparin  Disposition: Home pending clinical improvement, patient is Obs  Subjective:  Pt states her breathing is much better this morning. She denies any chest pain. She has no concerns.  Objective: Temp:  [97.3 F (36.3 C)-98.5 F (36.9 C)] 98.4 F (36.9 C) (08/19 0533) Pulse Rate:  [58-86] 73 (08/19 0533) Resp:  [12-24] 18 (08/19 0533) BP: (116-170)/(45-101) 125/50 (08/19 0533) SpO2:  [92 %-100 %] 95 % (08/19 0533) Weight:  [164 lb (74.4 kg)-167 lb 3.2 oz (75.8 kg)] 167 lb 3.2 oz (  75.8 kg) (08/18 1859) Physical Exam: General: Laying in bed, pleasant, in NAD HEENT: [place]/AT, EOMI, mildly dry mucous membranes Cardiovascular: RRR, no m/r/g Respiratory: improved air movement from previous exam, mild to moderate expiratory wheezing throughout all lung fields, crackles noted in lung  bases, normal work of breathing on room air Abdomen: soft, nontender, nondistended, normoactive BS, no hepatosplenomegaly MSK: Trace pitting edema improved from previous exam Skin: warm and well-perfused Neuro: CNII-XII grossly intact, no focal deficits Psych: normal behavior, appropriate affect  Laboratory:  Recent Labs Lab 10/10/15 1122 10/12/15 1342 10/13/15 0207  WBC 6.4 8.8 5.9  HGB 11.9* 12.3 11.2*  HCT 37.6 37.7 34.8*  PLT 172 171 189    Recent Labs Lab 10/10/15 1122 10/12/15 1342 10/13/15 0207 10/13/15 0515  NA 125* 125* 122* 125*  K 5.0 4.3 5.2* 4.5  CL 93* 93* 91* 92*  CO2 19* 24 19* 22  BUN 19 11 26* 25*  CREATININE 1.12* 0.97 1.72* 1.50*  CALCIUM 8.4* 9.0 8.6* 9.1  PROT 6.1 6.9  --   --   BILITOT 0.4 0.5  --   --   ALKPHOS 74 78  --   --   ALT 15 19  --   --   AST 21 28  --   --   GLUCOSE 233* 162* 570* 355*   BNP 247 Troponin 0.01  Imaging/Diagnostic Tests: CXR (8/18): Enlarged cardiac silhouette, no focal opacities  Sela Hua, MD 10/13/2015, 6:57 AM PGY-2, [place] Intern pager: [phone], text pages welcome

## 2015-10-13 NOTE — Progress Notes (Signed)
Called for CBG of 570. Anion gap is not open at 12. Will give another 10 units of novolog, and reassess in 90 minutes.

## 2015-10-13 NOTE — Progress Notes (Signed)
Call from RN for CBG 426. Insulin not given yet so instructed to give 22 units (resistant sliding scale).  Recheck CBG in 1 hour.

## 2015-10-14 ENCOUNTER — Observation Stay (HOSPITAL_BASED_OUTPATIENT_CLINIC_OR_DEPARTMENT_OTHER): Payer: Medicaid Other

## 2015-10-14 DIAGNOSIS — K7460 Unspecified cirrhosis of liver: Secondary | ICD-10-CM

## 2015-10-14 DIAGNOSIS — E118 Type 2 diabetes mellitus with unspecified complications: Secondary | ICD-10-CM | POA: Diagnosis not present

## 2015-10-14 DIAGNOSIS — I5030 Unspecified diastolic (congestive) heart failure: Secondary | ICD-10-CM

## 2015-10-14 DIAGNOSIS — M810 Age-related osteoporosis without current pathological fracture: Secondary | ICD-10-CM | POA: Diagnosis not present

## 2015-10-14 DIAGNOSIS — R0602 Shortness of breath: Secondary | ICD-10-CM | POA: Diagnosis not present

## 2015-10-14 DIAGNOSIS — I10 Essential (primary) hypertension: Secondary | ICD-10-CM

## 2015-10-14 LAB — CBC
HCT: 34.6 % — ABNORMAL LOW (ref 36.0–46.0)
Hemoglobin: 11.9 g/dL — ABNORMAL LOW (ref 12.0–15.0)
MCH: 29 pg (ref 26.0–34.0)
MCHC: 34.4 g/dL (ref 30.0–36.0)
MCV: 84.4 fL (ref 78.0–100.0)
Platelets: 197 10*3/uL (ref 150–400)
RBC: 4.1 MIL/uL (ref 3.87–5.11)
RDW: 13 % (ref 11.5–15.5)
WBC: 17.2 10*3/uL — ABNORMAL HIGH (ref 4.0–10.5)

## 2015-10-14 LAB — BASIC METABOLIC PANEL
Anion gap: 14 (ref 5–15)
BUN: 37 mg/dL — ABNORMAL HIGH (ref 6–20)
CO2: 23 mmol/L (ref 22–32)
Calcium: 9.4 mg/dL (ref 8.9–10.3)
Chloride: 92 mmol/L — ABNORMAL LOW (ref 101–111)
Creatinine, Ser: 1.19 mg/dL — ABNORMAL HIGH (ref 0.44–1.00)
GFR calc Af Amer: 49 mL/min — ABNORMAL LOW (ref >60)
GFR calc non Af Amer: 42 mL/min — ABNORMAL LOW (ref >60)
Glucose, Bld: 196 mg/dL — ABNORMAL HIGH (ref 65–99)
Potassium: 4.4 mmol/L (ref 3.5–5.1)
Sodium: 129 mmol/L — ABNORMAL LOW (ref 135–145)

## 2015-10-14 LAB — ECHOCARDIOGRAM COMPLETE
Height: 60 in
Weight: 2688 [oz_av]

## 2015-10-14 LAB — GLUCOSE, CAPILLARY
GLUCOSE-CAPILLARY: 175 mg/dL — AB (ref 65–99)
GLUCOSE-CAPILLARY: 311 mg/dL — AB (ref 65–99)
Glucose-Capillary: 195 mg/dL — ABNORMAL HIGH (ref 65–99)

## 2015-10-14 MED ORDER — PREDNISONE 20 MG PO TABS: 40.0000 mg | ORAL_TABLET | Freq: Every day | ORAL | 0 refills | Status: AC

## 2015-10-14 MED ORDER — IPRATROPIUM-ALBUTEROL 0.5-2.5 (3) MG/3ML IN SOLN
3.0000 mL | Freq: Three times a day (TID) | RESPIRATORY_TRACT | Status: DC
  Filled 2015-10-14: qty 3

## 2015-10-14 NOTE — Progress Notes (Signed)
Echocardiogram 2D Echocardiogram has been performed.  Joyce Gilmore 10/14/2015, 2:48 PM

## 2015-10-14 NOTE — Progress Notes (Signed)
Nsg Discharge Note  Admit Date:  10/12/2015 Discharge date: 10/14/2015   Joyce Gilmore to be D/C'd Home per MD order.  AVS completed.  Copy for chart, and copy for patient signed, and dated. Patient/caregiver able to verbalize understanding.  Discharge Medication:   Medication List    TAKE these medications   ACCU-CHEK AVIVA PLUS test strip Generic drug:  glucose blood USE TO CHECK BLOOD SUGAR FOUR TIMES DAILY AS DIRECTED   ACCU-CHEK SOFTCLIX LANCETS lancets USE AS DIRECTED   albuterol 108 (90 Base) MCG/ACT inhaler Commonly known as:  PROVENTIL HFA;VENTOLIN HFA Inhale 1-2 puffs into the lungs every 4 (four) hours as needed for wheezing or shortness of breath.   amLODipine 5 MG tablet Commonly known as:  NORVASC Take 1 tablet (5 mg total) by mouth daily.   B-D ULTRAFINE III SHORT PEN 31G X 8 MM Misc Generic drug:  Insulin Pen Needle USE AS DIRECTED   benzonatate 100 MG capsule Commonly known as:  TESSALON TAKE 1 CAPSULE(100 MG) BY MOUTH TWICE DAILY AS NEEDED FOR COUGH   blood glucose meter kit and supplies Kit Dispense based on patient and insurance preference. Use up to four times daily as directed. (FOR ICD-9 250.00, 250.01).   CALCIUM-D PO Take 1 tablet by mouth daily.   furosemide 20 MG tablet Commonly known as:  LASIX Take 1 tablet (20 mg total) by mouth daily.   insulin aspart 100 UNIT/ML FlexPen Commonly known as:  NOVOLOG FLEXPEN FOLLOW SLIDING SCALE AS DIRECTED What changed:  how much to take  how to take this  when to take this  additional instructions   Insulin Glargine 100 UNIT/ML Solostar Pen Commonly known as:  LANTUS SOLOSTAR Inject 36 Units into the skin at bedtime.   lansoprazole 30 MG capsule Commonly known as:  PREVACID TAKE 1 CAPSULE BY MOUTH TWICE DAILY BEFORE A MEAL What changed:  how much to take  how to take this  when to take this  additional instructions   lisinopril 10 MG tablet Commonly known as:   PRINIVIL,ZESTRIL Take 1 tablet (10 mg total) by mouth daily.   loratadine 10 MG tablet Commonly known as:  CLARITIN Take 1 tablet (10 mg total) by mouth daily. For allergies   metoprolol tartrate 25 MG tablet Commonly known as:  LOPRESSOR Take 1 tablet (25 mg total) by mouth 2 (two) times daily.   pravastatin 20 MG tablet Commonly known as:  PRAVACHOL Take 1 tablet (20 mg total) by mouth daily.   predniSONE 20 MG tablet Commonly known as:  DELTASONE Take 2 tablets (40 mg total) by mouth daily with breakfast.   sucralfate 1 g tablet Commonly known as:  CARAFATE Take 1 tablet (1 g total) by mouth 4 (four) times daily.       Discharge Assessment: Vitals:   10/14/15 0521 10/14/15 1534  BP: (!) 144/54 (!) 143/64  Pulse: 66 65  Resp: 20 18  Temp: 97.9 F (36.6 C) 99.5 F (37.5 C)   Skin clean, dry and intact without evidence of skin break down, no evidence of skin tears noted. IV catheter discontinued intact. Site without signs and symptoms of complications - no redness or edema noted at insertion site, patient denies c/o pain - only slight tenderness at site.  Dressing with slight pressure applied.  D/c Instructions-Education: Discharge instructions given to patient/family with verbalized understanding. D/c education completed with patient/family including follow up instructions, medication list, d/c activities limitations if indicated, with other d/c instructions as  indicated by MD - patient able to verbalize understanding, all questions fully answered. Patient instructed to return to ED, call 911, or call MD for any changes in condition.  Patient escorted via [place], and D/C home via private auto.  Salley Slaughter, RN 10/14/2015 5:43 PM

## 2015-10-14 NOTE — Discharge Summary (Signed)
[facility] Discharge Summary  Patient name: Joyce Gilmore Medical record number: [id number] Date of birth: 04-20-1934 Age: 81 y.o. Gender: female Date of Admission: 10/12/2015  Date of Discharge: 10/14/2015 Admitting Physician: Alveda Reasons, MD  Primary Care Provider: Vic Blackbird, MD Consultants: None  Indication for Hospitalization: Acute respiratory distress  Discharge Diagnoses/Problem List:  Patient Active Problem List   Diagnosis Date Noted  . Acute on chronic diastolic heart failure ([place]) 10/12/2015  . SOB (shortness of breath) 10/12/2015  . Shortness of breath 10/12/2015  . Seasonal allergies 11/21/2014  . DDD (degenerative disc disease), lumbar 10/11/2014  . H/O compression fracture of spine 10/11/2014  . Pulmonary hypertension ([place])   . (HFpEF) heart failure with preserved ejection fraction ([place])   . Cirrhosis of liver without ascites ([place])   . Prosthetic hip infection ([place])   . Emphysematous pyelitis 09/24/2014  . Hepatic cirrhosis ([place]) 09/24/2014  . Metabolic acidosis 40/98/1191  . SIADH (syndrome of inappropriate ADH production) ([place]) 06/13/2014  . Abdominal pain, epigastric 01/11/2014  . Insomnia 12/28/2013  . H. pylori infection 12/08/2013  . Anemia 07/06/2013  . DKA (diabetic ketoacidoses) ([place]) 06/27/2013  . HLD (hyperlipidemia) 06/27/2013  . Osteoporosis 07/26/2012  . Hyponatremia 05/16/2011  . Hyperkalemia 05/16/2011  . Type 2 diabetes mellitus with complication, without long-term current use of insulin ([place]) 08/17/2006  . Essential hypertension 08/17/2006  . DIVERTICULOSIS, COLON 08/17/2006     Disposition: Home  Discharge Condition: Stable  Discharge Exam:  Temp:  [97.5 F (36.4 C)-98.2 F (36.8 C)] 97.9 F (36.6 C) (08/20 0521) Pulse Rate:  [66-90] 66 (08/20 0521) Resp:  [17-20] 20 (08/20 0521) BP: (144-153)/(54-67) 144/54 (08/20 0521) SpO2:  [92 %-96 %] 92 % (08/20 0521) Weight:  [168 lb (76.2 kg)]  168 lb (76.2 kg) (08/20 0521) Physical Exam: General: NAD, sits comfortably in chair  Cardiovascular: RRR, no m/r/g Respiratory: CTA bil, no W/R/R Abdomen: soft, nontender, nondistended Extremities: no edema LE bil  Brief Hospital Course:  Patient presented with acute respiratory distress and wheezing thought to be a possible asthma exacerbation with likely CHF component.  Patient was admitted to the hospital for breathing treatments and closer monitoring.  She was treated with scheduled Duonebs, prednisone, and she was also gently diuresed with Lasix, one dose 20 mg IV and then transitioned to her home PO 20 mg Lasix.   She received one dose of ceftriaxone and clindamycin in the emergency department, however antibiotics were subsequently discontinued as her presentation did not seem to be infectious in etiology.  She was afebrile throughout hospitalization. The patient's respiratory status significantly improved over the course of her hospitalization, and she was considered stable for discharge with no wheezing or dyspnea.  While hospitalized, the patient was found to be hyperglycemic with blood glucose >300, managed on a novolog sliding scale and her home Lantus 36 units daily.  She was also noted to have AKI with creatinine that peaked at 1.72 likely secondary to lasix treatment, and the creatinine improved to 1.19 prior to discharge.   Issues for Follow Up:  1. Patient will need PFT's as an outpatient 2. Patient was hyperglycemic in the hospital, managed on a sliding scale and 36u of lantus.  Will need further management of diabetes as outpatient, it appears her diabetes worsened while she was away in [place] for the last two months. 3. Patient was hyponatremic with sodium 125-129 during hospitalization, asymptomatic, and based on previous tests this is apparently chronic.  May need to be followed up as outpatient.  Significant Procedures: Cardiac Echo Study Conclusions  - Left ventricle:  The cavity size was normal. Wall thickness was   normal. Systolic function was normal. The estimated ejection   fraction was in the range of 60% to 65%. Wall motion was normal;   there were no regional wall motion abnormalities. Doppler   parameters are consistent with abnormal left ventricular   relaxation (grade 1 diastolic dysfunction). Significant Labs and Imaging:     Recent Labs Lab 10/12/15 1342 10/13/15 0207 10/14/15 0508  WBC 8.8 5.9 17.2*  HGB 12.3 11.2* 11.9*  HCT 37.7 34.8* 34.6*  PLT 171 189 197    Recent Labs Lab 10/10/15 1122 10/12/15 1342 10/13/15 0207 10/13/15 0515 10/14/15 0508  NA 125* 125* 122* 125* 129*  K 5.0 4.3 5.2* 4.5 4.4  CL 93* 93* 91* 92* 92*  CO2 19* 24 19* 22 23  GLUCOSE 233* 162* 570* 355* 196*  BUN 19 11 26* 25* 37*  CREATININE 1.12* 0.97 1.72* 1.50* 1.19*  CALCIUM 8.4* 9.0 8.6* 9.1 9.4  ALKPHOS 74 78  --   --   --   AST 21 28  --   --   --   ALT 15 19  --   --   --   ALBUMIN 3.6 3.7  --   --   --      Results/Tests Pending at Time of Discharge: None  Discharge Medications:    Medication List    TAKE these medications   ACCU-CHEK AVIVA PLUS test strip Generic drug:  glucose blood USE TO CHECK BLOOD SUGAR FOUR TIMES DAILY AS DIRECTED   ACCU-CHEK SOFTCLIX LANCETS lancets USE AS DIRECTED   albuterol 108 (90 Base) MCG/ACT inhaler Commonly known as:  PROVENTIL HFA;VENTOLIN HFA Inhale 1-2 puffs into the lungs every 4 (four) hours as needed for wheezing or shortness of breath.   amLODipine 5 MG tablet Commonly known as:  NORVASC Take 1 tablet (5 mg total) by mouth daily.   B-D ULTRAFINE III SHORT PEN 31G X 8 MM Misc Generic drug:  Insulin Pen Needle USE AS DIRECTED   benzonatate 100 MG capsule Commonly known as:  TESSALON TAKE 1 CAPSULE(100 MG) BY MOUTH TWICE DAILY AS NEEDED FOR COUGH   blood glucose meter kit and supplies Kit Dispense based on patient and insurance preference. Use up to four times daily as directed.  (FOR ICD-9 250.00, 250.01).   CALCIUM-D PO Take 1 tablet by mouth daily.   furosemide 20 MG tablet Commonly known as:  LASIX Take 1 tablet (20 mg total) by mouth daily.   insulin aspart 100 UNIT/ML FlexPen Commonly known as:  NOVOLOG FLEXPEN FOLLOW SLIDING SCALE AS DIRECTED What changed:  how much to take  how to take this  when to take this  additional instructions   Insulin Glargine 100 UNIT/ML Solostar Pen Commonly known as:  LANTUS SOLOSTAR Inject 36 Units into the skin at bedtime.   lansoprazole 30 MG capsule Commonly known as:  PREVACID TAKE 1 CAPSULE BY MOUTH TWICE DAILY BEFORE A MEAL What changed:  how much to take  how to take this  when to take this  additional instructions   lisinopril 10 MG tablet Commonly known as:  PRINIVIL,ZESTRIL Take 1 tablet (10 mg total) by mouth daily.   loratadine 10 MG tablet Commonly known as:  CLARITIN Take 1 tablet (10 mg total) by mouth daily. For allergies   metoprolol tartrate 25  MG tablet Commonly known as:  LOPRESSOR Take 1 tablet (25 mg total) by mouth 2 (two) times daily.   pravastatin 20 MG tablet Commonly known as:  PRAVACHOL Take 1 tablet (20 mg total) by mouth daily.   predniSONE 20 MG tablet Commonly known as:  DELTASONE Take 2 tablets (40 mg total) by mouth daily with breakfast.   sucralfate 1 g tablet Commonly known as:  CARAFATE Take 1 tablet (1 g total) by mouth 4 (four) times daily.       Discharge Instructions: Please refer to Patient Instructions section of EMR for full details.  Patient was counseled important signs and symptoms that should prompt return to medical care, changes in medications, dietary instructions, activity restrictions, and follow up appointments.   Follow-Up Appointments: Follow-up Information    Vic Blackbird, MD. Schedule an appointment as soon as possible for a visit in 1 week(s).   Specialty:  Family Medicine Why:  Please call on monday to make a hospital  follow-up appointment with your primary care provider.  Contact information: [place] [address] [phone]           Everrett Coombe, MD 10/16/2015, 7:09 PM PGY-1, [place]

## 2015-10-14 NOTE — Progress Notes (Signed)
Family Medicine Teaching Service Daily Progress Note Intern Pager: [phone]  Patient name: Joyce Gilmore Medical record number: [id number] Date of birth: Jan 05, 1935 Age: 81 y.o. Gender: female  Primary Care Provider: Vic Blackbird, MD Consultants: None  Code Status: Full  Pt Overview and Major Events to Date:  8/18 admitted to [facility]MT[facility] with dyspnea   Assessment and Plan: ARHIANNA TORONTO a 81 y.o.femalepresenting with dyspnea and cough productive of yellow sputum. PMH is significant for HFpEF, Pulmonary hypertension, T2DM, HTN, osteoporosis, HLD, diverticulosis, SIADH and hyponatremia, Hepatic cirrhosis, degenerative disc disease and emphysematous pyelitis.  #Acute respiratory distress - Likely asthma exacerbation vs CHF exacerbation. Wheezing and dyspnea much improved this morning. Lungs clear and LE without edema this morning. Output not measured overnight. O2 sats 92-96% on room air. Patient with new leukocytosis today 17.2, may be 2/2 prednisone.  Afebrile, no tachycardia. - s/p one day of Ceftriaxone/[place], DC'd as low suspicion for CAP - Legionella urinary antigen pending, strep pneumoniae urinary antigen negative - S/p solumedrol, now day #2/4 Prednisone 40mg  daily (8/19-8/22) - scheduled Duonebs tid and 6hrs prn, tessalon pearls PRN - continue Lasix 20mg  PO daily (home dose). -  Follow up ECHO, completed today (10/14/2015) - daily weights, strict I/O - Supplemental O2 as needed to keep O2 sats > 92%, currently on room air - will need outpatient PFTs   #Hyperglycemia in T2DM- Poorly controlled with A1C 10/10/2015 was 9.5. Patient on lantus 36u qhs and novolog sliding scale at home. Had hyperglycemia to >300 overnight, came down to 196  this morning after a few doses of Novolog. - continue Lantus 36 units, looks like overnight glucose peaks were prior to receiving lantus dose last night - continue resistant SSI  #AKI: Improved creatinine this morning. Cr 0.97 on admission  > 1.72 > 1.50 > 1.19. Likely secondary to 1x dose IV Lasix. - continue Lasix 20mg  PO daily (home dose), Cr improving on this dose - Daily BMETs - Avoid nephrotoxic agents  #Hyponatremia, chronic- Improved with Na 129 this morning.  Na has been as low as 125 this admission, may be 2/2 legionella infection vs reported history of SIADH in the chart. Historically low sodium in the low to mid 120s, although sodium normal in March 2017. - Trend BMETs - Fluid restrict - Holding home Lisinopril, which can exacerbate hyponatremia  #h/o Emphysematous pyelitis - patient with one functional kidney per history. Monitor Cr (as above) with judicious diuresis. - monitor daily BMP especially in the setting of diuresis  #HTN, stable- BP elevated overnight and this morning 150/67. - continue home metoprolol 25 mg BID,  norvasc 5 mg - Holding home Lisinopril as above  #HLD- Stable. Patient without chest pain. Most recent lipid panel in 04/2015, WNL. - continue pravastatin 20 mg daily   FEN/GI: Renal/Carb modified diet with fluid restriction Prophylaxis: heparin  Disposition:Home pending clinical improvement and after echo, patient is Obs   Subjective:  Patient reports improved breathing today, comfortable on room air.  She was noted to have elevated blood sugars overnight and was given extra novolog doses.  She is without complaint this morning.  Objective: Temp:  [97.5 F (36.4 C)-98.2 F (36.8 C)] 97.9 F (36.6 C) (08/20 0521) Pulse Rate:  [66-90] 66 (08/20 0521) Resp:  [17-20] 20 (08/20 0521) BP: (144-153)/(54-67) 144/54 (08/20 0521) SpO2:  [92 %-96 %] 92 % (08/20 0521) Weight:  [168 lb (76.2 kg)] 168 lb (76.2 kg) (08/20 0521) Physical Exam: General: NAD, sits comfortably in chair  Cardiovascular:  RRR, no m/r/g Respiratory: CTA bil, no W/R/R Abdomen: soft, nontender, nondistended Extremities: no edema LE bil  Laboratory:  Recent Labs Lab 10/12/15 1342 10/13/15 0207  10/14/15 0508  WBC 8.8 5.9 17.2*  HGB 12.3 11.2* 11.9*  HCT 37.7 34.8* 34.6*  PLT 171 189 197    Recent Labs Lab 10/10/15 1122  10/12/15 1342 10/13/15 0207 10/13/15 0515 10/14/15 0508  NA 125*  --  125* 122* 125* 129*  K 5.0  --  4.3 5.2* 4.5 4.4  CL 93*  --  93* 91* 92* 92*  CO2 19*  --  24 19* 22 23  BUN 19  --  11 26* 25* 37*  CREATININE 1.12*  < > 0.97 1.72* 1.50* 1.19*  CALCIUM 8.4*  --  9.0 8.6* 9.1 9.4  PROT 6.1  --  6.9  --   --   --   BILITOT 0.4  --  0.5  --   --   --   ALKPHOS 74  --  78  --   --   --   ALT 15  --  19  --   --   --   AST 21  --  28  --   --   --   GLUCOSE 233*  --  162* 570* 355* 196*  < > = values in this interval not displayed.    Imaging/Diagnostic Tests: Echo 10/14/2015, results pending  Everrett Coombe, MD 10/14/2015, 8:07 AM PGY-1, [place] Intern pager: [phone], text pages welcome

## 2015-10-15 LAB — LEGIONELLA PNEUMOPHILA SEROGP 1 UR AG: L. pneumophila Serogp 1 Ur Ag: NEGATIVE

## 2015-10-17 ENCOUNTER — Inpatient Hospital Stay: Payer: Medicaid Other | Admitting: Physician Assistant

## 2015-10-19 ENCOUNTER — Encounter (HOSPITAL_COMMUNITY): Payer: Self-pay

## 2015-10-22 ENCOUNTER — Ambulatory Visit (INDEPENDENT_AMBULATORY_CARE_PROVIDER_SITE_OTHER): Payer: Medicaid Other | Admitting: Physician Assistant

## 2015-10-22 ENCOUNTER — Encounter: Payer: Self-pay | Admitting: Physician Assistant

## 2015-10-22 VITALS — BP 118/60 | HR 58 | Temp 98.2°F | Resp 20 | Wt 158.0 lb

## 2015-10-22 DIAGNOSIS — I272 Other secondary pulmonary hypertension: Secondary | ICD-10-CM | POA: Diagnosis not present

## 2015-10-22 DIAGNOSIS — E222 Syndrome of inappropriate secretion of antidiuretic hormone: Secondary | ICD-10-CM | POA: Diagnosis not present

## 2015-10-22 DIAGNOSIS — Z09 Encounter for follow-up examination after completed treatment for conditions other than malignant neoplasm: Secondary | ICD-10-CM

## 2015-10-22 DIAGNOSIS — I5033 Acute on chronic diastolic (congestive) heart failure: Secondary | ICD-10-CM

## 2015-10-22 NOTE — Progress Notes (Signed)
Patient ID: Joyce Gilmore MRN: [id number], DOB: 1934/03/06, 81 y.o. Date of Encounter: _0 @  Chief Complaint:  Chief Complaint  Patient presents with  . Hospitalization Follow-up    HPI: 81 y.o. year old female  presents with her daughter and with Spanish Interpreter.   I reviewed Dr. Dorian Heckle last office visit note from 10/12/15. Prior to that visit she had been seen by Dr. Buelah Manis 2 days prior secondary to shortness of breath wheezing cough and congestion. She had returned from [place] the day before. At that visit 2 days prior her diagnoses were diastolic dysfunction ( she had on echocardiogram in 2016. Her weight was up so she gave her Lasix 20 mg. Checked a BNP which came back at 208. X-ray did not show significant volume overload.) She came back for follow-up visit with Dr. Buelah Manis on 10/12/15 she still has significant swelling and increased work of breathing which was very labored and very wheezy. Second diagnosis had been bronchitis but she has had multiple times. She was given no which did improve in the office 2 days prior and she had been sent home with prednisone as well as albuterol and antibiotic as she had recently returned from [place]. When she came for follow-up on August 18 she was not having fever but had no improvement in her breathing. Also she has history of hyponatremia which had been evaluated in the past. At that visit sodium returned at 125 and A1c was increased at 9.5. Follow-up visit on August 18 Dr. Buelah Manis felt that she needed to go to the hospital.  She was admitted to the hospital from 10/12/2015 through 10/14/2015. I have reviewed her hospital discharge summary and the following information is from that.: "She presented with acute respiratory distress and wheezing thought to be possible asthma exacerbation with likely CHF component. Treated with DuoNeb prednisone and gently diuresed with Lasix 1 dose 20 mg IV then transition to home oral 20 mg Lasix. She received  1 dose of ceftriaxone and clindamycin in the ED. Her antibiotics were subsequently discontinued as her presentation did not seem to be infectious in etiology. Remained aebrile throughout hospitalization.  While hospitalized she was hyperglycemic with blood glucose greater than 300 managed with sliding scale and home Lantus 36 units daily. She was also noted to have AKI with creatinine that peaked at 1.72 likely secondary to Lasix treatment and creatinine improved 1.19 prior to discharge.  Regarding the hyponatremia her sodium was 125 -- 129 during hospitalization and this was asymptomatic and chronic.  Echocardiogram was performed. EF 60 --65%. Wall motion normal. Grade 1 diastolic dysfunction.  TODAY----10/22/2015: Both patient and daughter report that her breathing is back to her normal baseline. They have no complaints or concerns today.     Past Medical History:  Diagnosis Date  . Arthritis   . Chronic kidney disease    hx of kidney stones,   . Depression    hx of   . Diabetes mellitus without complication ([place])   . Ganglion, left ankle and foot   . Hearing loss of both ears   . Hypertension   . MRSA infection    Oct 13 - Nov 13  . Osteoporosis   . Tinnitus of both ears      Home Meds: Outpatient Medications Prior to Visit  Medication Sig Dispense Refill  . ACCU-CHEK AVIVA PLUS test strip USE TO CHECK BLOOD SUGAR FOUR TIMES DAILY AS DIRECTED 100 each 11  . ACCU-CHEK SOFTCLIX LANCETS lancets USE  AS DIRECTED 200 each 0  . albuterol (PROVENTIL HFA;VENTOLIN HFA) 108 (90 Base) MCG/ACT inhaler Inhale 1-2 puffs into the lungs every 4 (four) hours as needed for wheezing or shortness of breath. 18 Inhaler 1  . amLODipine (NORVASC) 5 MG tablet Take 1 tablet (5 mg total) by mouth daily. 90 tablet 3  . B-D ULTRAFINE III SHORT PEN 31G X 8 MM MISC USE AS DIRECTED 100 each 11  . benzonatate (TESSALON) 100 MG capsule TAKE 1 CAPSULE(100 MG) BY MOUTH TWICE DAILY AS NEEDED FOR COUGH 20  capsule 0  . blood glucose meter kit and supplies KIT Dispense based on patient and insurance preference. Use up to four times daily as directed. (FOR ICD-9 250.00, 250.01). 1 each 0  . Calcium Carbonate-Vitamin D (CALCIUM-D PO) Take 1 tablet by mouth daily.    . furosemide (LASIX) 20 MG tablet Take 1 tablet (20 mg total) by mouth daily. 30 tablet 0  . insulin aspart (NOVOLOG FLEXPEN) 100 UNIT/ML FlexPen FOLLOW SLIDING SCALE AS DIRECTED (Patient taking differently: Inject 3 Units into the skin 3 (three) times daily with meals. ) 15 mL 3  . Insulin Glargine (LANTUS SOLOSTAR) 100 UNIT/ML Solostar Pen Inject 36 Units into the skin at bedtime. 15 pen 3  . lansoprazole (PREVACID) 30 MG capsule TAKE 1 CAPSULE BY MOUTH TWICE DAILY BEFORE A MEAL (Patient taking differently: Take 30 mg by mouth 2 (two) times daily before a meal. ) 180 capsule 3  . lisinopril (PRINIVIL,ZESTRIL) 10 MG tablet Take 1 tablet (10 mg total) by mouth daily. 90 tablet 3  . loratadine (CLARITIN) 10 MG tablet Take 1 tablet (10 mg total) by mouth daily. For allergies 90 tablet 11  . metoprolol tartrate (LOPRESSOR) 25 MG tablet Take 1 tablet (25 mg total) by mouth 2 (two) times daily. 180 tablet 3  . pravastatin (PRAVACHOL) 20 MG tablet Take 1 tablet (20 mg total) by mouth daily. 90 tablet 3  . sucralfate (CARAFATE) 1 g tablet Take 1 tablet (1 g total) by mouth 4 (four) times daily. 360 tablet 3   No facility-administered medications prior to visit.     Allergies:  Allergies  Allergen Reactions  . Bactrim [Sulfamethoxazole-Trimethoprim]     Hyperkalemia  . Rifampin Other (See Comments)    Severe thrombocytopenia  . Vancomycin Other (See Comments)    Severe thrombocytopenia    Social History   Social History  . Marital status: Widowed    Spouse name: N/A  . Number of children: 7  . Years of education: N/A   Occupational History  . Not on file.   Social History Main Topics  . Smoking status: Never Smoker  . Smokeless  tobacco: Never Used  . Alcohol use No  . Drug use: No  . Sexual activity: No   Other Topics Concern  . Not on file   Social History Narrative   ** Merged History Encounter **        Family History  Problem Relation Age of Onset  . Hypertension Mother   . Colon cancer Neg Hx      Review of Systems:  See HPI for pertinent ROS. All other ROS negative.    Physical Exam: Blood pressure 118/60, pulse (!) 58, temperature 98.2 F (36.8 C), temperature source Oral, resp. rate 20, weight 158 lb (71.7 kg)., Body mass index is 30.86 kg/m. General: [place] Female. Appears in no acute distress. Neck: Supple. No thyromegaly. No lymphadenopathy. Lungs: Clear bilaterally to auscultation without wheezes,  rales, or rhonchi. Breathing is unlabored. Heart: RRR with S1 S2. No murmurs, rubs, or gallops. Abdomen: Soft, non-tender, non-distended with normoactive bowel sounds. No hepatomegaly. No rebound/guarding. No obvious abdominal masses. Musculoskeletal:  Strength and tone normal for age. Extremities/Skin: Warm and dry.  No edema.  Neuro: Alert and oriented X 3. Moves all extremities spontaneously. Gait is normal. CNII-XII grossly in tact. Psych:  Responds to questions appropriately with a normal affect.     ASSESSMENT AND PLAN:  81 y.o. year old female with  1. Hospital discharge follow-up  2. Pulmonary hypertension ([place])  3. Acute on chronic diastolic heart failure ([facility]) - CBC with Differential/Platelet - BASIC METABOLIC PANEL WITH GFR  4. SIADH (syndrome of inappropriate ADH production) ([facility]) - CBC with Differential/Platelet - BASIC METABOLIC PANEL WITH GFR  Will obtain follow-up labs to monitor. Still needs to be scheduled for PFTs.  Will have her schedule follow-up visit with Dr. Buelah Manis in the next 1-2 weeks to resume routine follow-up.   Marin Olp [place], [place], [facility] 10/22/2015 3:26 PM

## 2015-10-23 LAB — BASIC METABOLIC PANEL WITH GFR
BUN: 29 mg/dL — AB (ref 7–25)
CALCIUM: 9 mg/dL (ref 8.6–10.4)
CO2: 19 mmol/L — AB (ref 20–31)
CREATININE: 1.2 mg/dL — AB (ref 0.60–0.93)
Chloride: 104 mmol/L (ref 98–110)
GFR, EST AFRICAN AMERICAN: 50 mL/min — AB (ref >=60)
GFR, EST NON AFRICAN AMERICAN: 43 mL/min — AB (ref >=60)
GLUCOSE: 140 mg/dL — AB (ref 70–99)
Potassium: 5 mmol/L (ref 3.5–5.3)
Sodium: 135 mmol/L (ref 135–146)

## 2015-10-23 LAB — CBC WITH DIFFERENTIAL/PLATELET
BASOS ABS: 0 {cells}/uL (ref 0–200)
Basophils Relative: 0 %
EOS ABS: 270 {cells}/uL (ref 15–500)
EOS PCT: 3 %
HCT: 39.8 % (ref 35.0–45.0)
HEMOGLOBIN: 12.8 g/dL (ref 12.0–15.0)
LYMPHS ABS: 1980 {cells}/uL (ref 850–3900)
Lymphocytes Relative: 22 %
MCH: 28.1 pg (ref 27.0–33.0)
MCHC: 32.2 g/dL (ref 32.0–36.0)
MCV: 87.5 fL (ref 80.0–100.0)
MONOS PCT: 9 %
MPV: 10.6 fL (ref 7.5–12.5)
Monocytes Absolute: 810 cells/uL (ref 200–950)
NEUTROS ABS: 5940 {cells}/uL (ref 1500–7800)
NEUTROS PCT: 66 %
Platelets: 220 10*3/uL (ref 140–400)
RBC: 4.55 MIL/uL (ref 3.80–5.10)
RDW: 13.8 % (ref 11.0–15.0)
WBC: 9 10*3/uL (ref 3.8–10.8)

## 2015-11-07 ENCOUNTER — Encounter: Payer: Self-pay | Admitting: Family Medicine

## 2015-11-07 ENCOUNTER — Ambulatory Visit (INDEPENDENT_AMBULATORY_CARE_PROVIDER_SITE_OTHER): Payer: Medicaid Other | Admitting: Family Medicine

## 2015-11-07 VITALS — BP 100/58 | HR 58 | Temp 98.1°F | Resp 14 | Ht <= 58 in | Wt 154.0 lb

## 2015-11-07 DIAGNOSIS — I272 Other secondary pulmonary hypertension: Secondary | ICD-10-CM | POA: Diagnosis not present

## 2015-11-07 DIAGNOSIS — I5032 Chronic diastolic (congestive) heart failure: Secondary | ICD-10-CM | POA: Diagnosis not present

## 2015-11-07 DIAGNOSIS — Z23 Encounter for immunization: Secondary | ICD-10-CM | POA: Diagnosis not present

## 2015-11-07 DIAGNOSIS — I10 Essential (primary) hypertension: Secondary | ICD-10-CM | POA: Diagnosis not present

## 2015-11-07 DIAGNOSIS — E118 Type 2 diabetes mellitus with unspecified complications: Secondary | ICD-10-CM | POA: Diagnosis not present

## 2015-11-07 DIAGNOSIS — J411 Mucopurulent chronic bronchitis: Secondary | ICD-10-CM

## 2015-11-07 MED ORDER — FUROSEMIDE 20 MG PO TABS: 20.0000 mg | ORAL_TABLET | Freq: Every day | ORAL | 3 refills | Status: DC

## 2015-11-07 NOTE — Assessment & Plan Note (Signed)
Continue current dose of Lantus and sliding scale insulin should not bring her meter with her today. She states that her readings have been good. We'll recheck A1c in November

## 2015-11-07 NOTE — Addendum Note (Signed)
Addended by: Shary Decamp B on: 11/07/2015 09:57 AM   Modules accepted: Orders

## 2015-11-07 NOTE — Assessment & Plan Note (Signed)
Diastolic heart failure. Currently compensated her weight is down 12 pounds since my visit in August and we sent her to the hospital. She will continue the Lasix 20 mg once a day her renal function looks okay on this dose.

## 2015-11-07 NOTE — Progress Notes (Signed)
Subjective:    Patient ID: Joyce Gilmore, female    DOB: [birth year], 81 y.o.   MRN: [id number]  Patient presents for Medication Management and Flu Vaccine   A she here for interim follow-up. My last visit with her August 18 she was admitted to the hospital secondary to fluid overload concern for diastolic heart failure in the setting of bronchitis as well. She recently returned from [place] and not been taking her medications regularly her blood sugar was also elevated.   By problem list   Diabetes mellitus her A1c returned at 9.5%, she's currently on 36 units lantus  and sliding scale insulin     SIADH her sodium levels were initially low now back to her baseline which is between 125 to 130 she is asymptomatic from this. Last Sodium 2 weeks ago 135   Acute renal insufficiency in the setting of her illness as per above she was also placed on Lasix by myself to help with her fluid overload, he is now on Lasix 20 mg daily, creatine last 1.20 at recheck, BUN was 29    Diastolic dysfunction per above Lasix  Recurrent bronchitis she is due for pulmonary function tests which I plan to obtain after her illness improved  She also has pulmonary hypertension noted in the past.   FLu vaccine given   She has no concerns today. Interviewed today with her daughter and the interpreter   Review Of Systems:  GEN- denies fatigue, fever, weight loss,weakness, recent illness HEENT- denies eye drainage, change in vision, nasal discharge, CVS- denies chest pain, palpitations RESP- denies SOB, cough, wheeze ABD- denies N/V, change in stools, abd pain GU- denies dysuria, hematuria, dribbling, incontinence MSK- denies joint pain, muscle aches, injury Neuro- denies headache, dizziness, syncope, seizure activity       Objective:    BP (!) 100/58   Pulse (!) 58   Temp 98.1 F (36.7 C) (Oral)   Resp 14   Ht 4\' 8"  (1.422 m)   Wt 154 lb (69.9 kg)   BMI 34.53 kg/m  GEN- NAD, alert and oriented  x3 HEENT- PERRL, EOMI, non injected sclera, pink conjunctiva, MMM, oropharynx clear Neck- Supple, no thyromegaly,no JVD  CVS- RRR, no murmur RESP-CTAB ABD-NABS,soft,NT,ND EXT- No edema Pulses- Radial, DP- 2+        Assessment & Plan:      Problem List Items Addressed This Visit    Type 2 diabetes mellitus with complication, without long-term current use of insulin (HCC)    Continue current dose of Lantus and sliding scale insulin should not bring her meter with her today. She states that her readings have been good. We'll recheck A1c in November      Pulmonary hypertension [facility])    Pulmonary hypertension noted in her chart she also has recurrent bronchitis episodes. I will send her to pulmonary for evaluation and pulmonary function tests.      Essential hypertension    Her blood pressure was low on the low in today but she is asymptomatic she typically has higher running blood pressure therefore not make any changes today      Chronic diastolic heart failure (HCC)    Diastolic heart failure. Currently compensated her weight is down 12 pounds since my visit in August and we sent her to the hospital. She will continue the Lasix 20 mg once a day her renal function looks okay on this dose.       Other Visit Diagnoses  None.     Note: This dictation was prepared with Dragon dictation along with smaller phrase technology. Any transcriptional errors that result from this process are unintentional.

## 2015-11-07 NOTE — Patient Instructions (Signed)
She will be referred to pulmonary for lung test Get her lasix from pharmacy Flu shot given today  F/U end of November

## 2015-11-07 NOTE — Assessment & Plan Note (Signed)
Her blood pressure was low on the low in today but she is asymptomatic she typically has higher running blood pressure therefore not make any changes today

## 2015-11-07 NOTE — Addendum Note (Signed)
Addended by: Vic Blackbird F on: 11/07/2015 10:14 AM   Modules accepted: Orders

## 2015-11-07 NOTE — Assessment & Plan Note (Signed)
Pulmonary hypertension noted in her chart she also has recurrent bronchitis episodes. I will send her to pulmonary for evaluation and pulmonary function tests.

## 2015-11-10 ENCOUNTER — Other Ambulatory Visit: Payer: Self-pay | Admitting: Family Medicine

## 2015-12-03 ENCOUNTER — Other Ambulatory Visit: Payer: Self-pay | Admitting: Family Medicine

## 2015-12-04 NOTE — Telephone Encounter (Signed)
okay

## 2015-12-04 NOTE — Telephone Encounter (Signed)
LRF 10/10/15 #20   LOV 11/07/15  OK refill?

## 2015-12-04 NOTE — Telephone Encounter (Signed)
Medication refilled per protocol. 

## 2015-12-12 ENCOUNTER — Ambulatory Visit (INDEPENDENT_AMBULATORY_CARE_PROVIDER_SITE_OTHER): Payer: Medicaid Other | Admitting: Pulmonary Disease

## 2015-12-12 ENCOUNTER — Encounter: Payer: Self-pay | Admitting: Pulmonary Disease

## 2015-12-12 ENCOUNTER — Other Ambulatory Visit (INDEPENDENT_AMBULATORY_CARE_PROVIDER_SITE_OTHER): Payer: Medicaid Other

## 2015-12-12 VITALS — BP 118/74 | HR 61 | Ht <= 58 in | Wt 159.8 lb

## 2015-12-12 DIAGNOSIS — R0602 Shortness of breath: Secondary | ICD-10-CM

## 2015-12-12 LAB — CBC WITH DIFFERENTIAL/PLATELET
BASOS PCT: 0.6 % (ref 0.0–3.0)
Basophils Absolute: 0 10*3/uL (ref 0.0–0.1)
EOS ABS: 0.2 10*3/uL (ref 0.0–0.7)
Eosinophils Relative: 2.3 % (ref 0.0–5.0)
HCT: 37.5 % (ref 36.0–46.0)
HEMOGLOBIN: 12.7 g/dL (ref 12.0–15.0)
LYMPHS ABS: 1.7 10*3/uL (ref 0.7–4.0)
Lymphocytes Relative: 26.4 % (ref 12.0–46.0)
MCHC: 33.9 g/dL (ref 30.0–36.0)
MCV: 84.3 fl (ref 78.0–100.0)
MONO ABS: 0.6 10*3/uL (ref 0.1–1.0)
Monocytes Relative: 8.5 % (ref 3.0–12.0)
NEUTROS ABS: 4 10*3/uL (ref 1.4–7.7)
NEUTROS PCT: 62.2 % (ref 43.0–77.0)
PLATELETS: 204 10*3/uL (ref 150.0–400.0)
RBC: 4.45 Mil/uL (ref 3.87–5.11)
RDW: 15 % (ref 11.5–15.5)
WBC: 6.5 10*3/uL (ref 4.0–10.5)

## 2015-12-12 NOTE — Patient Instructions (Addendum)
We will check your lung function to evaluate asthma.  We will check blood tests for allergies with a CBC with differential and blood allergy profile. I believe your shortness of breath is from a combination of asthma, heart failure and excessive weight Please continue to work on weight loss, cardiac medications and lasix for fluid overload. Please follow a healthy diet with less salt.   Continue using the albuterol inhaler as ou are doing now. I will see you back in clinic in 1 month to review results and revaluate.

## 2015-12-12 NOTE — Progress Notes (Signed)
RECIE CIRRINCIONE    [id number]    1934/08/03  Primary Care Physician:Central Aguirre, Lonell Grandchild, MD  Referring Physician: Alycia Rossetti, MD [address] [place], [place] [address]  Chief complaint: Consult for evaluation of pulmonary hypertension  HPI: Joyce Gilmore is a 81 year old with a recent diagnosis of asthma. She is an immigrant from [place] and had a trip to [place] in December 2016. She admits to dietary indiscretion and noncompliance with medications during that. She apparently had decompensation of diastolic heart failure and diabetes. She continued to do poorly after her return to the Center. She was hospitalized in August for a combination of CHF, asthma exacerbation. She does not speak [place] and is here with her son who is helping to translate.  A previous echo in 16 showed slight elevation in PA pressure. She has a diagnosis of pulmonary hypertension. However repeat echo in 2017 shows normal PA pressures. Diastolic dysfunction is redemonstrated with normal EF.  Outpatient Encounter Prescriptions as of 12/12/2015  Medication Sig  . ACCU-CHEK AVIVA PLUS test strip USE TO CHECK BLOOD SUGAR FOUR TIMES DAILY AS DIRECTED  . ACCU-CHEK SOFTCLIX LANCETS lancets USE AS DIRECTED  . albuterol (PROVENTIL HFA;VENTOLIN HFA) 108 (90 Base) MCG/ACT inhaler Inhale 1-2 puffs into the lungs every 4 (four) hours as needed for wheezing or shortness of breath.  Marland Kitchen amLODipine (NORVASC) 5 MG tablet Take 1 tablet (5 mg total) by mouth daily.  . B-D ULTRAFINE III SHORT PEN 31G X 8 MM MISC USE AS DIRECTED  . benzonatate (TESSALON) 100 MG capsule TAKE 1 CAPSULE(100 MG) BY MOUTH TWICE DAILY AS NEEDED FOR COUGH  . blood glucose meter kit and supplies KIT Dispense based on patient and insurance preference. Use up to four times daily as directed. (FOR ICD-9 250.00, 250.01).  . Calcium Carbonate-Vitamin D (CALCIUM-D PO) Take 1 tablet by mouth daily.  . furosemide (LASIX) 20 MG tablet Take 1 tablet (20  mg total) by mouth daily. For fluid - spanish speaking  . furosemide (LASIX) 20 MG tablet TAKE 1 TABLET(20 MG) BY MOUTH DAILY  . insulin aspart (NOVOLOG FLEXPEN) 100 UNIT/ML FlexPen FOLLOW SLIDING SCALE AS DIRECTED (Patient taking differently: Inject 3 Units into the skin 3 (three) times daily with meals. )  . Insulin Glargine (LANTUS SOLOSTAR) 100 UNIT/ML Solostar Pen Inject 36 Units into the skin at bedtime.  . lansoprazole (PREVACID) 30 MG capsule TAKE 1 CAPSULE BY MOUTH TWICE DAILY BEFORE A MEAL (Patient taking differently: Take 30 mg by mouth 2 (two) times daily before a meal. )  . lisinopril (PRINIVIL,ZESTRIL) 10 MG tablet Take 1 tablet (10 mg total) by mouth daily.  . metoprolol tartrate (LOPRESSOR) 25 MG tablet Take 1 tablet (25 mg total) by mouth 2 (two) times daily.  . pravastatin (PRAVACHOL) 20 MG tablet Take 1 tablet (20 mg total) by mouth daily.  . sucralfate (CARAFATE) 1 g tablet Take 1 tablet (1 g total) by mouth 4 (four) times daily.   No facility-administered encounter medications on file as of 12/12/2015.     Allergies as of 12/12/2015 - Review Complete 11/07/2015  Allergen Reaction Noted  . Bactrim [sulfamethoxazole-trimethoprim]  04/08/2012  . Rifampin Other (See Comments) 01/06/2012  . Vancomycin Other (See Comments) 01/06/2012    Past Medical History:  Diagnosis Date  . Arthritis   . CHF (congestive heart failure) (HCC)    diastolic  . Chronic kidney disease    hx of kidney stones,   .  Depression    hx of   . Diabetes mellitus without complication ([place])   . Ganglion, left ankle and foot   . Hearing loss of both ears   . Hypertension   . MRSA infection    Oct 13 - Nov 13  . Osteoporosis   . Tinnitus of both ears     Past Surgical History:  Procedure Laterality Date  . BACK SURGERY     history restored after error with record merge      . INCISION AND DRAINAGE HIP  12/22/2011   Procedure: IRRIGATION AND DEBRIDEMENT HIP;  Surgeon: Mcarthur Rossetti,  MD;  Location: [facility];  Service: Orthopedics;  Laterality: Right;  Irrigation and debridement right hip  . INCISION AND DRAINAGE HIP  12/27/2011   Procedure: IRRIGATION AND DEBRIDEMENT HIP;  Surgeon: Mcarthur Rossetti, MD;  Location: [facility];  Service: Orthopedics;  Laterality: Right;  Repeat irrigation and debridement  . JOINT REPLACEMENT     bilateral knee, right hip   . TOTAL HIP REVISION  12/05/2011   Procedure: TOTAL HIP REVISION;  Surgeon: Mcarthur Rossetti, MD;  Location: [facility];  Service: Orthopedics;  Laterality: Right;  Right Hip Revision Arthroplasty to Total Hip, Excision of Old Implant    Family History  Problem Relation Age of Onset  . Hypertension Mother   . Colon cancer Neg Hx     Social History   Social History  . Marital status: Widowed    Spouse name: N/A  . Number of children: 7  . Years of education: N/A   Occupational History  . Not on file.   Social History Main Topics  . Smoking status: Never Smoker  . Smokeless tobacco: Never Used  . Alcohol use No  . Drug use: No  . Sexual activity: No   Other Topics Concern  . Not on file   Social History Narrative   ** Merged History Encounter **         Review of systems: Review of Systems  Constitutional: Negative for fever and chills.  HENT: Negative.   Eyes: Negative for blurred vision.  Respiratory: as per HPI  Cardiovascular: Negative for chest pain and palpitations.  Gastrointestinal: Negative for vomiting, diarrhea, blood per rectum. Genitourinary: Negative for dysuria, urgency, frequency and hematuria.  Musculoskeletal: Negative for myalgias, back pain and joint pain.  Skin: Negative for itching and rash.  Neurological: Negative for dizziness, tremors, focal weakness, seizures and loss of consciousness.  Endo/Heme/Allergies: Negative for environmental allergies.  Psychiatric/Behavioral: Negative for depression, suicidal ideas and hallucinations.  All other systems reviewed and are  negative.   Physical Exam: There were no vitals taken for this visit. Gen:      No acute distress HEENT:  EOMI, sclera anicteric Neck:     No masses; no thyromegaly Lungs:    Clear to auscultation bilaterally; normal respiratory effort CV:         Regular rate and rhythm; no murmurs Abd:      + bowel sounds; soft, non-tender; no palpable masses, no distension Ext:    No edema; adequate peripheral perfusion Skin:      Warm and dry; no rash Neuro: alert and oriented x 3 Psych: normal mood and affect  Data Reviewed:  Echo 10/14/15 Left ventricle: The cavity size was normal. Wall thickness was   normal. Systolic function was normal. The estimated ejection   fraction was in the range of 60% to 65%. Wall motion was normal;  there were no regional wall motion abnormalities. Doppler   parameters are consistent with abnormal left ventricular   relaxation (grade 1 diastolic dysfunction). Pulmonary artery:   The main pulmonary artery was normal-sized. Systolic pressure was within the normal range.  Echo 09/25/14 Left ventricle: Systolic function was normal. The estimated   ejection fraction was in the range of 60% to 65%. Doppler   parameters are consistent with abnormal left ventricular   relaxation (grade 1 diastolic dysfunction). - Left atrium: The atrium was mildly dilated. - Pulmonary arteries: PA peak pressure: 44 mm Hg (S).  Chest x-ray 10/12/15 Cardiomegaly, bibasilar opacities. Images reviewed.  CT chest 01/03/12 No PE, bibasilar atelectasis. Images reviewed.  HIV 09/26/14- Non reactive  LFTs 10/12/15- WNL  Assessment:  #1 Evaluation for dyspnea. Dyspnea appears multifactorial from a combination of HFPEF, deconditioning, excessive weight. She does have a diagnosis of asthma but her symptoms are not very typical. I will evaluate this further with spirometry and an FENO. But I am not sure if she can cooperate adequately due to language barrier and we may just end up getting a  spirometry. She is also get a CBC with differential and a blood allergy profile. I asked her to continue the albuterol for now until we get more data. She'll also continue her CHF medications including Lasix. I have asked her to work on weight loss, diet, exercise.  #2 Pulmonary HTN Her last echo in 2017 does not show elevation in PA pressure and she has normal RV function. There is a suggestion of cirrhosis on her CT of the abdomen but LFTs are normal and HIV test is negative. There is no evidence of connective tissue disease. I don't believe this needs further workup. We'll continue monitoring her for symptoms.  Plan/Recommendations: - Continue albuterol PRN - CBC with diff, Blood allergy profile - PFTs, FENO  Return in 1 month  Marshell Garfinkel MD [facility] Pager [phone] 12/12/2015, 9:32 AM  CC: Alycia Rossetti, MD

## 2015-12-13 LAB — RESPIRATORY ALLERGY PROFILE REGION II ~~LOC~~
Allergen, A. alternata, m6: <0.1 kU/L
Allergen, C. Herbarum, M2: <0.1 kU/L
Allergen, Cottonwood, t14: 0.58 kU/L — ABNORMAL HIGH
Allergen, D pternoyssinus,d7: <0.1 kU/L
Allergen, P. notatum, m1: <0.1 kU/L
Aspergillus fumigatus, m3: <0.1 kU/L
BOX ELDER: 0.31 kU/L — AB
Bermuda Grass: <0.1 kU/L
Common Ragweed: <0.1 kU/L
D. farinae: <0.1 kU/L
ELM IGE: 0.17 kU/L — AB
IGE (IMMUNOGLOBULIN E), SERUM: 50 kU/L (ref <115)
Johnson Grass: <0.1 kU/L
Pecan/Hickory Tree IgE: 1.9 kU/L — ABNORMAL HIGH
Rough Pigweed  IgE: <0.1 kU/L
Timothy Grass: <0.1 kU/L

## 2015-12-17 ENCOUNTER — Encounter: Payer: Self-pay | Admitting: Family Medicine

## 2015-12-17 ENCOUNTER — Ambulatory Visit (INDEPENDENT_AMBULATORY_CARE_PROVIDER_SITE_OTHER): Payer: Medicaid Other | Admitting: Family Medicine

## 2015-12-17 VITALS — BP 122/78 | HR 58 | Temp 98.7°F | Resp 16 | Ht <= 58 in | Wt 158.0 lb

## 2015-12-17 DIAGNOSIS — M19011 Primary osteoarthritis, right shoulder: Secondary | ICD-10-CM | POA: Diagnosis not present

## 2015-12-17 DIAGNOSIS — B372 Candidiasis of skin and nail: Secondary | ICD-10-CM

## 2015-12-17 DIAGNOSIS — R05 Cough: Secondary | ICD-10-CM

## 2015-12-17 DIAGNOSIS — R059 Cough, unspecified: Secondary | ICD-10-CM

## 2015-12-17 MED ORDER — BENZONATATE 100 MG PO CAPS: ORAL_CAPSULE | ORAL | 0 refills | Status: DC

## 2015-12-17 MED ORDER — NYSTATIN 100000 UNIT/GM EX CREA: 1.0000 "application " | TOPICAL_CREAM | Freq: Two times a day (BID) | CUTANEOUS | 0 refills | Status: DC

## 2015-12-17 NOTE — Patient Instructions (Addendum)
Get  the tessalon- this is not covered by the insurance, other option is robitussin DM  For her itching use the cream twice a day - this is yeast Came from the recent antibiotics  For shoulder use tylenol extra strength  500mg  twice a day  F/U as previous

## 2015-12-17 NOTE — Progress Notes (Signed)
Subjective:    Patient ID: Joyce Gilmore, female    DOB: 1934/05/31, 81 y.o.   MRN: [id number]  Patient presents for Cough (x2 weeks- nonproductive cough) and Vaginal Itching (x4 days- denies discharge- has been given Amox on 10/11 from denitist, but pt states that she did not pick it up- pharmacy reports that it has been picked up)  Here with cough nonproduction for the past 2 weeks she was seen by her pulmonologist for her recurrent episodes on 10/18 plan was for spirometry and labs. His been using her albuterol dailyAs needed. She is out of her Ladona Ridgel and needs a refill on these. These did help. We did call the pharmacy however her Medicaid does not cover them. She denies any fever she does not have any difficulty really during the day her cough gets worse at nighttime. She's not had any leg swelling does not feel short of breath she is taking her Lasix as described for her heart failure  She also complains of vaginal itching no discharge for the past few days. She had been on amoxicillin given to her by her dentist. She is not using topical creams. Denies any itching internally no vaginal bleeding no dysuria.  At end of visit she complains of right shoulder pain states this is been on and off for the past month or sospecific injury. She still able to use her arm and has some pain when she lifts it above shoulder height. She does have known arthritis. She has not tried anything over-the-counter. She has not had any swelling.   Review Of Systems:  GEN- denies fatigue, fever, weight loss,weakness, recent illness HEENT- denies eye drainage, change in vision, nasal discharge, CVS- denies chest pain, palpitations RESP- denies SOB, cough, wheeze ABD- denies N/V, change in stools, abd pain GU- denies dysuria, hematuria, dribbling, incontinence MSK- + joint pain, muscle aches, injury Neuro- denies headache, dizziness, syncope, seizure activity       Objective:    BP 122/78 (BP  Location: Left Arm, Patient Position: Sitting, Cuff Size: Large)   Pulse (!) 58   Temp 98.7 F (37.1 C) (Oral)   Resp 16   Ht 4\' 8"  (1.422 m)   Wt 158 lb (71.7 kg)   SpO2 98% Comment: RA  BMI 35.42 kg/m  GEN- NAD, alert and oriented x3 HEENT- PERRL, EOMI, non injected sclera, pink conjunctiva, MMM, oropharynx clear CVS- RRR, no murmur RESP-CTAB ABD-NABS,soft,NT,ND GU- normal external genitalia- atrophic, MILD erythema in inguinal creases and along labia majora, no discharge at introitus ,  MSK- Fair ROM Right upper ext, compared to left, no popping, no swelling, TTP near [facility] and over delotid, neg empty can, biceps in tact  EXT- No edema Pulses- Radial 2+        Assessment & Plan:      Problem List Items Addressed This Visit    None    Visit Diagnoses    Intertriginous candidiasis    -  Primary   Topical nystatin cream, recent antibiotics and diabetic, no internal vaginitis symptoms   Relevant Medications   nystatin cream (MYCOSTATIN)   Primary osteoarthritis of right shoulder       Acetaminophen safest for her at this time for shoulder pain   Cough       Restart tessalon, does not appear to be acutely ill and compensated from CHF standpoint. Family will have to pay out of pocket or get robitussin DM. F/U pulmonary for PFT I think this is  related to her bronchitis or underlying lung pathology, on GI medications already,  only other possibilty is the low dose ACEI but will await Evaluation first       Note: This dictation was prepared with Dragon dictation along with smaller phrase technology. Any transcriptional errors that result from this process are unintentional.

## 2016-01-02 ENCOUNTER — Other Ambulatory Visit: Payer: Self-pay | Admitting: Family Medicine

## 2016-01-05 ENCOUNTER — Other Ambulatory Visit: Payer: Self-pay | Admitting: Family Medicine

## 2016-01-08 ENCOUNTER — Ambulatory Visit (INDEPENDENT_AMBULATORY_CARE_PROVIDER_SITE_OTHER): Payer: Medicaid Other | Admitting: Family Medicine

## 2016-01-08 ENCOUNTER — Encounter: Payer: Self-pay | Admitting: Family Medicine

## 2016-01-08 VITALS — BP 128/78 | HR 72 | Temp 98.7°F | Resp 20 | Ht <= 58 in | Wt 161.0 lb

## 2016-01-08 DIAGNOSIS — M25511 Pain in right shoulder: Secondary | ICD-10-CM

## 2016-01-08 DIAGNOSIS — J4521 Mild intermittent asthma with (acute) exacerbation: Secondary | ICD-10-CM

## 2016-01-08 DIAGNOSIS — G8929 Other chronic pain: Secondary | ICD-10-CM

## 2016-01-08 MED ORDER — GUAIFENESIN-CODEINE 100-10 MG/5ML PO SOLN
5.0000 mL | Freq: Four times a day (QID) | ORAL | 0 refills | Status: DC | PRN
Start: 2016-01-08 — End: 2016-06-10

## 2016-01-08 MED ORDER — PREDNISONE 20 MG PO TABS: 40.0000 mg | ORAL_TABLET | Freq: Every day | ORAL | 0 refills | Status: DC

## 2016-01-08 MED ORDER — METHYLPREDNISOLONE ACETATE 40 MG/ML IJ SUSP
40.0000 mg | Freq: Once | INTRAMUSCULAR | Status: AC
  Administered 2016-01-08: 40 mg via INTRAMUSCULAR

## 2016-01-08 MED ORDER — ALBUTEROL SULFATE HFA 108 (90 BASE) MCG/ACT IN AERS: 1.0000 | INHALATION_SPRAY | RESPIRATORY_TRACT | 1 refills | Status: DC | PRN

## 2016-01-08 MED ORDER — ALBUTEROL SULFATE (2.5 MG/3ML) 0.083% IN NEBU
2.5000 mg | INHALATION_SOLUTION | Freq: Once | RESPIRATORY_TRACT | Status: AC
  Administered 2016-01-08: 2.5 mg via RESPIRATORY_TRACT

## 2016-01-08 NOTE — Progress Notes (Signed)
   Subjective:    Patient ID: Joyce Gilmore, female    DOB: 1934/07/28, 81 y.o.   MRN: [id number]  Patient presents for Cough (productive cough with white mucus, chest congestion, wheezing)  Patient here with her daughter and interpreter. For the past 4 days she's had cough with mild production wheezing to mild shortness of breath and feeling fatigued. She has not had any fever. She had mild runny nose preceding symptoms. She has not taken anything over-the-counter. She tried her Tessalon but this did not help.  No recent travel , no known sick contacts   Weight up 2 lbs since last visit   At end of visit family asked for referral to [facility] [facility] to evaluate her chronic shoulder pain on the right side Review Of Systems:  GEN- +s fatigue, fever, weight loss,weakness, recent illness HEENT- denies eye drainage, change in vision, +nasal discharge, CVS- denies chest pain, palpitations RESP- denies SOB, +cough, +wheeze ABD- denies N/V, change in stools, abd pain GU- denies dysuria, hematuria, dribbling, incontinence MSK- denies joint pain, muscle aches, injury Neuro- denies headache, dizziness, syncope, seizure activity       Objective:    BP 128/78 (BP Location: Right Arm, Patient Position: Sitting, Cuff Size: Large)   Pulse 72   Temp 98.7 F (37.1 C) (Oral)   Resp 20   Ht 4\' 8"  (1.422 m)   Wt 161 lb (73 kg)   SpO2 97%   BMI 36.10 kg/m  GEN- NAD, alert and oriented x3 HEENT- PERRL, EOMI, non injected sclera, pink conjunctiva, MMM, oropharynx clear, TM Clear bilat no effusion , nares clear rhinorrhea  Neck- Supple, no LAD  CVS- RRR, no murmur RESP-bilat wheeze, mild congestion, no rales, normal WOB at rest  EXT- No edema Pulses- Radial, DP- 2+  S/P neb- improved bronchospasm, good air movement       Assessment & Plan:      Problem List Items Addressed This Visit    None    Visit Diagnoses    Mild intermittent asthmatic bronchitis with acute exacerbation    -   Primary   Treat for bronchitis, PFT at end of month, steroids, given robitussin codiene family will try to purchase, if not robitussin DM, albuterol, good response to nebs, I dont think this is heart failure at this time Advised CBG may run up, can adjust short acting insulin to 4 units    Relevant Medications   predniSONE (DELTASONE) 20 MG tablet   albuterol (PROVENTIL HFA;VENTOLIN HFA) 108 (90 Base) MCG/ACT inhaler   methylPREDNISolone acetate (DEPO-MEDROL) injection 40 mg (Completed)   albuterol (PROVENTIL) (2.5 MG/3ML) 0.083% nebulizer solution 2.5 mg (Completed)      Note: This dictation was prepared with Dragon dictation along with smaller phrase technology. Any transcriptional errors that result from this process are unintentional.

## 2016-01-08 NOTE — Patient Instructions (Addendum)
Take prednisone Use albuterol inhaler every 4 hours Give Robitussin DM  She has lung doctor appointment on 11/30 Increase Novolog to 4 units with meals if blood sugars are >  200 at meal time  [facility] referral for shoulder  Change f/u to 2nd week in December

## 2016-01-09 ENCOUNTER — Encounter: Payer: Self-pay | Admitting: Family Medicine

## 2016-01-10 ENCOUNTER — Ambulatory Visit (INDEPENDENT_AMBULATORY_CARE_PROVIDER_SITE_OTHER): Payer: Medicaid Other

## 2016-01-10 ENCOUNTER — Ambulatory Visit (INDEPENDENT_AMBULATORY_CARE_PROVIDER_SITE_OTHER): Payer: Medicaid Other | Admitting: Orthopaedic Surgery

## 2016-01-10 DIAGNOSIS — G8929 Other chronic pain: Secondary | ICD-10-CM | POA: Diagnosis not present

## 2016-01-10 DIAGNOSIS — M25511 Pain in right shoulder: Secondary | ICD-10-CM

## 2016-01-10 MED ORDER — METHYLPREDNISOLONE ACETATE 40 MG/ML IJ SUSP
40.0000 mg | INTRAMUSCULAR | Status: AC | PRN
Start: 2016-01-10 — End: 2016-01-10
  Administered 2016-01-10: 40 mg via INTRA_ARTICULAR

## 2016-01-10 MED ORDER — LIDOCAINE HCL 1 % IJ SOLN
3.0000 mL | INTRAMUSCULAR | Status: AC | PRN
  Administered 2016-01-10: 3 mL

## 2016-01-10 NOTE — Progress Notes (Signed)
Office Visit Note   Patient: Joyce Gilmore           Date of Birth: 1934/09/14           MRN: [id number] Visit Date: 01/10/2016              Requested by: Alycia Rossetti, MD [address] [place], [place] [address] PCP: Vic Blackbird, MD   Assessment & Plan: Visit Diagnoses:  1. Acute pain of right shoulder   2. Chronic right shoulder pain     Plan: She tolerated the injection well and her right shoulder. As always she'll follow-up as needed. Certainly arthroscopic intervention of the shoulder could help some. I will have her try her cane also in her left hand while onset of her right hand.  Follow-Up Instructions: Return if symptoms worsen or fail to improve.   Orders:  Orders Placed This Encounter  Procedures  . Large Joint Injection/Arthrocentesis  . XR Shoulder Right   No orders of the defined types were placed in this encounter.     Procedures: Large Joint Inj Date/Time: 01/10/2016 10:57 AM Performed by: Mcarthur Rossetti Authorized by: Mcarthur Rossetti   Location:  Shoulder Site:  R subacromial bursa Ultrasound Guidance: No   Fluoroscopic Guidance: No   Arthrogram: No   Medications:  3 mL lidocaine 1 %; 40 mg methylPREDNISolone acetate 40 MG/ML     Clinical Data: No additional findings.   Subjective: Chief Complaint  Patient presents with  . Right Shoulder - Pain    Right shoulder pain, much more pain with ROM. She states this cold weather is making the pain worse. PCP gave her some prednisone which helps some. She denies any neck pain   Ms. Joyce Gilmore is well-known to me. She's been having problems with her right shoulder for some time now. It hurts with overhead activities and reaching behind her as well. It does wake her up at night. HPI  Review of Systems She does have some slight wheezing and has known asthma that is being treated. She otherwise is negative for chest pain nausea and vomiting or headache  Objective: Vital  Signs: There were no vitals taken for this visit.  Physical Exam He is alert and oriented 3 in no acute distress Ortho Exam Examination of her right shoulder shows deficits of the rotator cuff and pain with overhead activities. There is positive Neer and Hawkins sign as well. I suspect she has a rotator cuff deficient shoulder Specialty Comments:  No specialty comments available.  Imaging: Xr Shoulder Right  Result Date: 01/10/2016 An AP, outlet, and axillary view of her right shoulder shows rotator cuff arthropathy. The humeral head is high riding and they're significant arthritis at the [facility]  joint. The shoulders otherwise well located and there is no evidence of an acute injury    PMFS History: Patient Active Problem List   Diagnosis Date Noted  . Chronic diastolic heart failure ([place]) 10/12/2015  . SOB (shortness of breath) 10/12/2015  . Shortness of breath 10/12/2015  . Seasonal allergies 11/21/2014  . DDD (degenerative disc disease), lumbar 10/11/2014  . H/O compression fracture of spine 10/11/2014  . Pulmonary hypertension   . Prosthetic hip infection ([place])   . Hepatic cirrhosis ([place]) 09/24/2014  . Metabolic acidosis 123456  . SIADH (syndrome of inappropriate ADH production) ([place]) 06/13/2014  . Insomnia 12/28/2013  . H. pylori infection 12/08/2013  . Anemia 07/06/2013  . HLD (hyperlipidemia) 06/27/2013  .  Osteoporosis 07/26/2012  . Hyponatremia 05/16/2011  . Hyperkalemia 05/16/2011  . Type 2 diabetes mellitus with complication, without long-term current use of insulin ([place]) 08/17/2006  . Essential hypertension 08/17/2006  . DIVERTICULOSIS, COLON 08/17/2006   Past Medical History:  Diagnosis Date  . Arthritis   . CHF (congestive heart failure) (HCC)    diastolic  . Chronic kidney disease    hx of kidney stones,   . Depression    hx of   . Diabetes mellitus without complication ([place])   . Ganglion, left ankle and foot   . Hearing loss of both ears   .  Hypertension   . MRSA infection    Oct 13 - Nov 13  . Osteoporosis   . Tinnitus of both ears     Family History  Problem Relation Age of Onset  . Hypertension Mother   . Colon cancer Neg Hx     Past Surgical History:  Procedure Laterality Date  . BACK SURGERY     history restored after error with record merge      . INCISION AND DRAINAGE HIP  12/22/2011   Procedure: IRRIGATION AND DEBRIDEMENT HIP;  Surgeon: Mcarthur Rossetti, MD;  Location: [facility];  Service: Orthopedics;  Laterality: Right;  Irrigation and debridement right hip  . INCISION AND DRAINAGE HIP  12/27/2011   Procedure: IRRIGATION AND DEBRIDEMENT HIP;  Surgeon: Mcarthur Rossetti, MD;  Location: [facility];  Service: Orthopedics;  Laterality: Right;  Repeat irrigation and debridement  . JOINT REPLACEMENT     bilateral knee, right hip   . TOTAL HIP REVISION  12/05/2011   Procedure: TOTAL HIP REVISION;  Surgeon: Mcarthur Rossetti, MD;  Location: [facility];  Service: Orthopedics;  Laterality: Right;  Right Hip Revision Arthroplasty to Total Hip, Excision of Old Implant   Social History   Occupational History  . Not on file.   Social History Main Topics  . Smoking status: Never Smoker  . Smokeless tobacco: Never Used  . Alcohol use No  . Drug use: No  . Sexual activity: No

## 2016-01-16 ENCOUNTER — Other Ambulatory Visit: Payer: Self-pay | Admitting: Family Medicine

## 2016-01-23 ENCOUNTER — Ambulatory Visit: Payer: Medicaid Other | Admitting: Family Medicine

## 2016-01-24 ENCOUNTER — Ambulatory Visit (INDEPENDENT_AMBULATORY_CARE_PROVIDER_SITE_OTHER): Payer: Medicaid Other | Admitting: Pulmonary Disease

## 2016-01-24 DIAGNOSIS — R0602 Shortness of breath: Secondary | ICD-10-CM | POA: Diagnosis not present

## 2016-01-24 LAB — PULMONARY FUNCTION TEST
DL/VA % PRED: 0 %
DL/VA: 0.01 ml/min/mmHg/L
DLCO cor % pred: 0 %
DLCO cor: 0.02 ml/min/mmHg
DLCO unc % pred: 0 %
DLCO unc: 0.02 ml/min/mmHg
FEF 25-75 PRE: 1.68 L/s
FEF 25-75 Post: 2.39 L/sec
FEF2575-%CHANGE-POST: 41 %
FEF2575-%PRED-POST: 229 %
FEF2575-%PRED-PRE: 161 %
FEV1-%Change-Post: 9 %
FEV1-%PRED-PRE: 106 %
FEV1-%Pred-Post: 117 %
FEV1-PRE: 1.39 L
FEV1-Post: 1.52 L
FEV1FVC-%CHANGE-POST: 2 %
FEV1FVC-%Pred-Pre: 113 %
FEV6-%CHANGE-POST: 6 %
FEV6-%Pred-Post: 105 %
FEV6-%Pred-Pre: 99 %
FEV6-PRE: 1.65 L
FEV6-Post: 1.76 L
FEV6FVC-%Change-Post: 0 %
FEV6FVC-%Pred-Post: 107 %
FEV6FVC-%Pred-Pre: 107 %
FVC-%CHANGE-POST: 6 %
FVC-%PRED-POST: 99 %
FVC-%Pred-Pre: 92 %
FVC-PRE: 1.65 L
FVC-Post: 1.76 L
POST FEV1/FVC RATIO: 86 %
PRE FEV1/FVC RATIO: 84 %
Post FEV6/FVC ratio: 100 %
Pre FEV6/FVC Ratio: 100 %

## 2016-01-25 ENCOUNTER — Encounter: Payer: Self-pay | Admitting: Pulmonary Disease

## 2016-01-25 ENCOUNTER — Ambulatory Visit (INDEPENDENT_AMBULATORY_CARE_PROVIDER_SITE_OTHER): Payer: Medicaid Other | Admitting: Pulmonary Disease

## 2016-01-25 VITALS — BP 116/68 | HR 67 | Ht <= 58 in | Wt 157.2 lb

## 2016-01-25 DIAGNOSIS — R0602 Shortness of breath: Secondary | ICD-10-CM

## 2016-01-25 NOTE — Progress Notes (Signed)
CHANNON BROUGHER    [id number]    Jul 12, 1934  Primary Care Physician:Dahlgren, Lonell Grandchild, MD  Referring Physician: Alycia Rossetti, MD [address] [place], [place] [address]  Chief complaint: Follow up for evaluation of dyspnea. ? Pulm HTN  HPI: [name] is a 81 year old with a recent diagnosis of asthma. She is an immigrant from [place] and had a trip to [place] in December 2016. She admits to dietary indiscretion and noncompliance with medications during that. She apparently had decompensation of diastolic heart failure and diabetes. She continued to do poorly after her [place] the [place]. She was hospitalized in August for a combination of CHF, asthma exacerbation. She does not speak [place] and is here with her son who is helping to translate.  A previous echo in 16 showed slight elevation in PA pressure. She has a diagnosis of pulmonary hypertension. However repeat echo in 2017 shows normal PA pressures. Diastolic dysfunction is redemonstrated with normal EF.  Outpatient Encounter Prescriptions as of 01/25/2016  Medication Sig  . ACCU-CHEK AVIVA PLUS test strip USE TO CHECK BLOOD SUGAR FOUR TIMES DAILY AS DIRECTED  . ACCU-CHEK SOFTCLIX LANCETS lancets USE AS DIRECTED  . albuterol (PROVENTIL HFA;VENTOLIN HFA) 108 (90 Base) MCG/ACT inhaler Inhale 1-2 puffs into the lungs every 4 (four) hours as needed for wheezing or shortness of breath.  Marland Kitchen amLODipine (NORVASC) 5 MG tablet Take 1 tablet (5 mg total) by mouth daily.  . B-D ULTRAFINE III SHORT PEN 31G X 8 MM MISC USE AS DIRECTED  . benzonatate (TESSALON) 100 MG capsule TAKE 1 CAPSULE(100 MG) BY MOUTH TWICE DAILY AS NEEDED FOR COUGH  . blood glucose meter kit and supplies KIT Dispense based on patient and insurance preference. Use up to four times daily as directed. (FOR ICD-9 250.00, 250.01).  . furosemide (LASIX) 20 MG tablet Take 1 tablet (20 mg total) by mouth daily. For fluid - spanish speaking  . guaiFENesin-codeine  100-10 MG/5ML syrup Take 5 mLs by mouth every 6 (six) hours as needed.  . insulin aspart (NOVOLOG FLEXPEN) 100 UNIT/ML FlexPen FOLLOW SLIDING SCALE AS DIRECTED (Patient taking differently: Inject 3 Units into the skin 3 (three) times daily with meals. )  . Insulin Glargine (LANTUS SOLOSTAR) 100 UNIT/ML Solostar Pen Inject 36 Units into the skin at bedtime.  . lansoprazole (PREVACID) 30 MG capsule TAKE 1 CAPSULE BY MOUTH TWICE DAILY BEFORE A MEAL (Patient taking differently: Take 30 mg by mouth 2 (two) times daily before a meal. )  . LANTUS SOLOSTAR 100 UNIT/ML Solostar Pen INJECT 36 UNITS INTO THE SKIN EVERY NIGHT AT BEDTIME  . lisinopril (PRINIVIL,ZESTRIL) 10 MG tablet Take 1 tablet (10 mg total) by mouth daily.  . metoprolol tartrate (LOPRESSOR) 25 MG tablet Take 1 tablet (25 mg total) by mouth 2 (two) times daily.  Marland Kitchen nystatin cream (MYCOSTATIN) Apply 1 application topically 2 (two) times daily.  . pravastatin (PRAVACHOL) 20 MG tablet Take 1 tablet (20 mg total) by mouth daily.  . predniSONE (DELTASONE) 20 MG tablet Take 2 tablets (40 mg total) by mouth daily with breakfast.  . sucralfate (CARAFATE) 1 g tablet Take 1 tablet (1 g total) by mouth 4 (four) times daily.   No facility-administered encounter medications on file as of 01/25/2016.     Allergies as of 01/25/2016 - Review Complete 01/25/2016  Allergen Reaction Noted  . Bactrim [sulfamethoxazole-trimethoprim]  04/08/2012  . Rifampin Other (See Comments) 01/06/2012  .  Vancomycin Other (See Comments) 01/06/2012    Past Medical History:  Diagnosis Date  . Arthritis   . CHF (congestive heart failure) (HCC)    diastolic  . Chronic kidney disease    hx of kidney stones,   . Depression    hx of   . Diabetes mellitus without complication ([place])   . Ganglion, left ankle and foot   . Hearing loss of both ears   . Hypertension   . MRSA infection    Oct 13 - Nov 13  . Osteoporosis   . Tinnitus of both ears     Past Surgical  History:  Procedure Laterality Date  . BACK SURGERY     history restored after error with record merge      . INCISION AND DRAINAGE HIP  12/22/2011   Procedure: IRRIGATION AND DEBRIDEMENT HIP;  Surgeon: Mcarthur Rossetti, MD;  Location: [facility];  Service: Orthopedics;  Laterality: Right;  Irrigation and debridement right hip  . INCISION AND DRAINAGE HIP  12/27/2011   Procedure: IRRIGATION AND DEBRIDEMENT HIP;  Surgeon: Mcarthur Rossetti, MD;  Location: [facility];  Service: Orthopedics;  Laterality: Right;  Repeat irrigation and debridement  . JOINT REPLACEMENT     bilateral knee, right hip   . TOTAL HIP REVISION  12/05/2011   Procedure: TOTAL HIP REVISION;  Surgeon: Mcarthur Rossetti, MD;  Location: [facility];  Service: Orthopedics;  Laterality: Right;  Right Hip Revision Arthroplasty to Total Hip, Excision of Old Implant    Family History  Problem Relation Age of Onset  . Hypertension Mother   . Colon cancer Neg Hx     Social History   Social History  . Marital status: Widowed    Spouse name: N/A  . Number of children: 7  . Years of education: N/A   Occupational History  . Not on file.   Social History Main Topics  . Smoking status: Never Smoker  . Smokeless tobacco: Never Used  . Alcohol use No  . Drug use: No  . Sexual activity: No   Other Topics Concern  . Not on file   Social History Narrative   ** Merged History Encounter **       Review of systems: Review of Systems  Constitutional: Negative for fever and chills.  HENT: Negative.   Eyes: Negative for blurred vision.  Respiratory: as per HPI  Cardiovascular: Negative for chest pain and palpitations.  Gastrointestinal: Negative for vomiting, diarrhea, blood per rectum. Genitourinary: Negative for dysuria, urgency, frequency and hematuria.  Musculoskeletal: Negative for myalgias, back pain and joint pain.  Skin: Negative for itching and rash.  Neurological: Negative for dizziness, tremors, focal  weakness, seizures and loss of consciousness.  Endo/Heme/Allergies: Negative for environmental allergies.  Psychiatric/Behavioral: Negative for depression, suicidal ideas and hallucinations.  All other systems reviewed and are negative.  Physical Exam: There were no vitals taken for this visit. Gen:      No acute distress HEENT:  EOMI, sclera anicteric Neck:     No masses; no thyromegaly Lungs:    Clear to auscultation bilaterally; normal respiratory effort CV:         Regular rate and rhythm; no murmurs Abd:      + bowel sounds; soft, non-tender; no palpable masses, no distension Ext:    No edema; adequate peripheral perfusion Skin:      Warm and dry; no rash Neuro: alert and oriented x 3 Psych: normal mood and affect  Data  Reviewed: Echo 10/14/15 Left ventricle: The cavity size was normal. Wall thickness was   normal. Systolic function was normal. The estimated ejection   fraction was in the range of 60% to 65%. Wall motion was normal;   there were no regional wall motion abnormalities. Doppler   parameters are consistent with abnormal left ventricular   relaxation (grade 1 diastolic dysfunction). Pulmonary artery:   The main pulmonary artery was normal-sized. Systolic pressure was within the normal range.  Echo 09/25/14 Left ventricle: Systolic function was normal. The estimated   ejection fraction was in the range of 60% to 65%. Doppler   parameters are consistent with abnormal left ventricular   relaxation (grade 1 diastolic dysfunction). - Left atrium: The atrium was mildly dilated. - Pulmonary arteries: PA peak pressure: 44 mm Hg (S).  Chest x-ray 10/12/15 Cardiomegaly, bibasilar opacities. Images reviewed.  CT chest 01/03/12 No PE, bibasilar atelectasis. Images reviewed.  HIV 09/26/14- Non reactive  LFTs 10/12/15- WNL   Spirometry 01/24/16 FVC 1.76 (99%] FEV1 1.52 (117%) F/F 86 Normal spirometry. No broncho dilator response. Unable to do diffusion and lung  volumes  Assessment:  #1 Evaluation for dyspnea. Dyspnea appears multifactorial from a combination of HFPEF, deconditioning, excessive weight. She does have a diagnosis of asthma but her symptoms are not very typical. PFTs reviewed today. They do not show any obstruction. She'll also continue her CHF medications including Lasix. I have asked her to work on weight loss, diet, exercise. She has albuterol rescue inhaler but uses it very rapidly.  #2 Pulmonary HTN Her last echo in 2017 does not show elevation in PA pressure and she has normal RV function. There is a suggestion of cirrhosis on her CT of the abdomen but LFTs are normal and HIV test is negative. There is no evidence of connective tissue disease. I don't believe this needs further workup. We'll continue monitoring her for symptoms.  Plan/Recommendations: - Continue albuterol PRN  Return in 6 months. If she continues to be stable she can be discharged from the pulmonary clinic.  Marshell Garfinkel MD  Pulmonary and Critical Care Pager [phone] 01/25/2016, 10:01 AM  CC: Alycia Rossetti, MD

## 2016-01-25 NOTE — Patient Instructions (Signed)
Continue using her albuterol inhaler as needed. Return to clinic in 6 months.

## 2016-02-04 ENCOUNTER — Ambulatory Visit: Payer: Medicaid Other | Admitting: Family Medicine

## 2016-02-20 ENCOUNTER — Ambulatory Visit (INDEPENDENT_AMBULATORY_CARE_PROVIDER_SITE_OTHER): Payer: Medicaid Other | Admitting: Physician Assistant

## 2016-02-20 ENCOUNTER — Encounter: Payer: Self-pay | Admitting: Physician Assistant

## 2016-02-20 VITALS — BP 130/70 | HR 75 | Temp 98.1°F | Resp 18 | Wt 155.0 lb

## 2016-02-20 DIAGNOSIS — B9689 Other specified bacterial agents as the cause of diseases classified elsewhere: Principal | ICD-10-CM

## 2016-02-20 DIAGNOSIS — J988 Other specified respiratory disorders: Secondary | ICD-10-CM

## 2016-02-20 DIAGNOSIS — J209 Acute bronchitis, unspecified: Secondary | ICD-10-CM

## 2016-02-20 MED ORDER — PREDNISONE 20 MG PO TABS: 20.0000 mg | ORAL_TABLET | Freq: Every day | ORAL | 0 refills | Status: DC

## 2016-02-20 MED ORDER — AZITHROMYCIN 250 MG PO TABS: ORAL_TABLET | ORAL | 0 refills | Status: DC

## 2016-02-20 NOTE — Progress Notes (Signed)
Patient ID: Joyce Gilmore MRN: [id number], DOB: 1935/01/06, 81 y.o. Date of Encounter: 02/20/2016, 4:50 PM    Chief Complaint:  Chief Complaint  Patient presents with  . Cough    x 1 wk  . upper respiratory congestion     HPI: 81 y.o. year old female is here with her son and also Geneticist, molecular.  They report that her symptoms started Sunday 02/17/16. They report that she has had a runny nose and congestion and also coughing up yellow phlegm and feels like she is wheezing. She has had no known fever. No significant sore throat. No ear ache.  Noted that oxygen saturation is 96% on room air. Temperature here is 98.1. Also noted that on exam she has no LE edema.  I reviewed her note by [facility] from 01/25/16.       Home Meds:   Outpatient Medications Prior to Visit  Medication Sig Dispense Refill  . ACCU-CHEK AVIVA PLUS test strip USE TO CHECK BLOOD SUGAR FOUR TIMES DAILY AS DIRECTED 100 each 0  . ACCU-CHEK SOFTCLIX LANCETS lancets USE AS DIRECTED 200 each 0  . albuterol (PROVENTIL HFA;VENTOLIN HFA) 108 (90 Base) MCG/ACT inhaler Inhale 1-2 puffs into the lungs every 4 (four) hours as needed for wheezing or shortness of breath. 18 Inhaler 1  . amLODipine (NORVASC) 5 MG tablet Take 1 tablet (5 mg total) by mouth daily. 90 tablet 3  . B-D ULTRAFINE III SHORT PEN 31G X 8 MM MISC USE AS DIRECTED 100 each 11  . benzonatate (TESSALON) 100 MG capsule TAKE 1 CAPSULE(100 MG) BY MOUTH TWICE DAILY AS NEEDED FOR COUGH 20 capsule 0  . blood glucose meter kit and supplies KIT Dispense based on patient and insurance preference. Use up to four times daily as directed. (FOR ICD-9 250.00, 250.01). 1 each 0  . furosemide (LASIX) 20 MG tablet Take 1 tablet (20 mg total) by mouth daily. For fluid - spanish speaking 30 tablet 3  . guaiFENesin-codeine 100-10 MG/5ML syrup Take 5 mLs by mouth every 6 (six) hours as needed. 120 mL 0  . insulin aspart (NOVOLOG FLEXPEN) 100  UNIT/ML FlexPen FOLLOW SLIDING SCALE AS DIRECTED (Patient taking differently: Inject 3 Units into the skin 3 (three) times daily with meals. ) 15 mL 3  . Insulin Glargine (LANTUS SOLOSTAR) 100 UNIT/ML Solostar Pen Inject 36 Units into the skin at bedtime. 15 pen 3  . lansoprazole (PREVACID) 30 MG capsule TAKE 1 CAPSULE BY MOUTH TWICE DAILY BEFORE A MEAL (Patient taking differently: Take 30 mg by mouth 2 (two) times daily before a meal. ) 180 capsule 3  . LANTUS SOLOSTAR 100 UNIT/ML Solostar Pen INJECT 36 UNITS INTO THE SKIN EVERY NIGHT AT BEDTIME 15 mL 2  . lisinopril (PRINIVIL,ZESTRIL) 10 MG tablet Take 1 tablet (10 mg total) by mouth daily. 90 tablet 3  . metoprolol tartrate (LOPRESSOR) 25 MG tablet Take 1 tablet (25 mg total) by mouth 2 (two) times daily. 180 tablet 3  . nystatin cream (MYCOSTATIN) Apply 1 application topically 2 (two) times daily. 30 g 0  . pravastatin (PRAVACHOL) 20 MG tablet Take 1 tablet (20 mg total) by mouth daily. 90 tablet 3  . predniSONE (DELTASONE) 20 MG tablet Take 2 tablets (40 mg total) by mouth daily with breakfast. 10 tablet 0  . sucralfate (CARAFATE) 1 g tablet Take 1 tablet (1 g total) by mouth 4 (four) times daily. 360 tablet 3   No facility-administered medications prior to  visit.     Allergies:  Allergies  Allergen Reactions  . Bactrim [Sulfamethoxazole-Trimethoprim]     Hyperkalemia  . Rifampin Other (See Comments)    Severe thrombocytopenia  . Vancomycin Other (See Comments)    Severe thrombocytopenia      Review of Systems: See HPI for pertinent ROS. All other ROS negative.    Physical Exam: Blood pressure 130/70, pulse 75, temperature 98.1 F (36.7 C), temperature source Oral, resp. rate 18, weight 155 lb (70.3 kg), SpO2 96 %., Body mass index is 34.75 kg/m. General:  Hispanic Female. Appears in no acute distress. HEENT: Normocephalic, atraumatic, eyes without discharge, sclera non-icteric, nares are without discharge. Bilateral auditory  canals clear, TM's are without perforation, pearly grey and translucent with reflective cone of light bilaterally. Oral cavity moist, posterior pharynx without exudate, erythema, peritonsillar abscess.  Neck: Supple. No thyromegaly. No lymphadenopathy. Lungs: Distant/decreased BS but no distinct wheeze Heart: Regular Rhythm  Msk:  Strength and tone normal for age. Extremities/Skin: Warm and dry. No LE edema. Neuro: Alert and oriented X 3. Moves all extremities spontaneously. Gait is normal. CNII-XII grossly in tact. Psych:  Responds to questions appropriately with a normal affect.     ASSESSMENT AND PLAN:  81 y.o. year old female with  1. Bacterial respiratory infection She is to take azithromycin and prednisone as directed. Follow-up with [place] if symptoms worsen or do not return back to normal baseline within 1 week after completion of these. - azithromycin (ZITHROMAX) 250 MG tablet; Day 1: Take 2 daily. Days 2-5: Take 1 daily.  Dispense: 6 tablet; Refill: 0 - predniSONE (DELTASONE) 20 MG tablet; Take 1 tablet (20 mg total) by mouth daily with breakfast.  Dispense: 5 tablet; Refill: 0  2. Acute bronchitis, unspecified organism She is to take azithromycin and prednisone as directed. Follow-up with [place] if symptoms worsen or do not return back to normal baseline within 1 week after completion of these. - azithromycin (ZITHROMAX) 250 MG tablet; Day 1: Take 2 daily. Days 2-5: Take 1 daily.  Dispense: 6 tablet; Refill: 0 - predniSONE (DELTASONE) 20 MG tablet; Take 1 tablet (20 mg total) by mouth daily with breakfast.  Dispense: 5 tablet; Refill: 0   Signed, [address] [place], [place], [facility] 02/20/2016 4:50 PM

## 2016-02-21 ENCOUNTER — Inpatient Hospital Stay (HOSPITAL_COMMUNITY)
Admission: EM | Admit: 2016-02-21 | Discharge: 2016-02-23 | DRG: 191 | Disposition: A | Discharge status: Home / Self Care | Payer: Medicaid Other | Attending: Internal Medicine | Admitting: Internal Medicine

## 2016-02-21 ENCOUNTER — Encounter (HOSPITAL_COMMUNITY): Payer: Self-pay | Admitting: *Deleted

## 2016-02-21 ENCOUNTER — Emergency Department (HOSPITAL_COMMUNITY): Payer: Medicaid Other

## 2016-02-21 DIAGNOSIS — J45901 Unspecified asthma with (acute) exacerbation: Secondary | ICD-10-CM

## 2016-02-21 DIAGNOSIS — E785 Hyperlipidemia, unspecified: Secondary | ICD-10-CM | POA: Diagnosis present

## 2016-02-21 DIAGNOSIS — I5032 Chronic diastolic (congestive) heart failure: Secondary | ICD-10-CM | POA: Diagnosis not present

## 2016-02-21 DIAGNOSIS — J988 Other specified respiratory disorders: Secondary | ICD-10-CM

## 2016-02-21 DIAGNOSIS — E1122 Type 2 diabetes mellitus with diabetic chronic kidney disease: Secondary | ICD-10-CM | POA: Diagnosis present

## 2016-02-21 DIAGNOSIS — E784 Other hyperlipidemia: Secondary | ICD-10-CM | POA: Diagnosis not present

## 2016-02-21 DIAGNOSIS — Z79899 Other long term (current) drug therapy: Secondary | ICD-10-CM

## 2016-02-21 DIAGNOSIS — Z794 Long term (current) use of insulin: Secondary | ICD-10-CM

## 2016-02-21 DIAGNOSIS — I10 Essential (primary) hypertension: Secondary | ICD-10-CM | POA: Diagnosis not present

## 2016-02-21 DIAGNOSIS — J9600 Acute respiratory failure, unspecified whether with hypoxia or hypercapnia: Secondary | ICD-10-CM | POA: Insufficient documentation

## 2016-02-21 DIAGNOSIS — J441 Chronic obstructive pulmonary disease with (acute) exacerbation: Principal | ICD-10-CM | POA: Diagnosis present

## 2016-02-21 DIAGNOSIS — Z881 Allergy status to other antibiotic agents status: Secondary | ICD-10-CM

## 2016-02-21 DIAGNOSIS — E871 Hypo-osmolality and hyponatremia: Secondary | ICD-10-CM | POA: Diagnosis present

## 2016-02-21 DIAGNOSIS — E118 Type 2 diabetes mellitus with unspecified complications: Secondary | ICD-10-CM

## 2016-02-21 DIAGNOSIS — I130 Hypertensive heart and chronic kidney disease with heart failure and stage 1 through stage 4 chronic kidney disease, or unspecified chronic kidney disease: Secondary | ICD-10-CM | POA: Diagnosis present

## 2016-02-21 DIAGNOSIS — J209 Acute bronchitis, unspecified: Secondary | ICD-10-CM

## 2016-02-21 DIAGNOSIS — H9193 Unspecified hearing loss, bilateral: Secondary | ICD-10-CM | POA: Diagnosis present

## 2016-02-21 DIAGNOSIS — B9689 Other specified bacterial agents as the cause of diseases classified elsewhere: Secondary | ICD-10-CM

## 2016-02-21 DIAGNOSIS — N189 Chronic kidney disease, unspecified: Secondary | ICD-10-CM | POA: Diagnosis present

## 2016-02-21 DIAGNOSIS — B974 Respiratory syncytial virus as the cause of diseases classified elsewhere: Secondary | ICD-10-CM | POA: Diagnosis present

## 2016-02-21 DIAGNOSIS — Z7952 Long term (current) use of systemic steroids: Secondary | ICD-10-CM

## 2016-02-21 DIAGNOSIS — J4521 Mild intermittent asthma with (acute) exacerbation: Secondary | ICD-10-CM | POA: Diagnosis not present

## 2016-02-21 DIAGNOSIS — E1165 Type 2 diabetes mellitus with hyperglycemia: Secondary | ICD-10-CM | POA: Diagnosis present

## 2016-02-21 DIAGNOSIS — I272 Pulmonary hypertension, unspecified: Secondary | ICD-10-CM | POA: Diagnosis present

## 2016-02-21 DIAGNOSIS — M810 Age-related osteoporosis without current pathological fracture: Secondary | ICD-10-CM | POA: Diagnosis present

## 2016-02-21 LAB — CBC WITH DIFFERENTIAL/PLATELET
Basophils Absolute: 0 10*3/uL (ref 0.0–0.1)
Basophils Relative: 1 %
EOS PCT: 2 %
Eosinophils Absolute: 0.1 10*3/uL (ref 0.0–0.7)
HEMATOCRIT: 36.8 % (ref 36.0–46.0)
Hemoglobin: 12.4 g/dL (ref 12.0–15.0)
LYMPHS ABS: 2.8 10*3/uL (ref 0.7–4.0)
LYMPHS PCT: 44 %
MCH: 28.7 pg (ref 26.0–34.0)
MCHC: 33.7 g/dL (ref 30.0–36.0)
MCV: 85.2 fL (ref 78.0–100.0)
MONO ABS: 0.6 10*3/uL (ref 0.1–1.0)
MONOS PCT: 9 %
NEUTROS ABS: 2.9 10*3/uL (ref 1.7–7.7)
Neutrophils Relative %: 44 %
PLATELETS: 147 10*3/uL — AB (ref 150–400)
RBC: 4.32 MIL/uL (ref 3.87–5.11)
RDW: 13.3 % (ref 11.5–15.5)
WBC: 6.4 10*3/uL (ref 4.0–10.5)

## 2016-02-21 LAB — COMPREHENSIVE METABOLIC PANEL
ALBUMIN: 3.8 g/dL (ref 3.5–5.0)
ALT: 16 U/L (ref 14–54)
ANION GAP: 9 (ref 5–15)
AST: 23 U/L (ref 15–41)
Alkaline Phosphatase: 69 U/L (ref 38–126)
BILIRUBIN TOTAL: 0.4 mg/dL (ref 0.3–1.2)
BUN: 8 mg/dL (ref 6–20)
CHLORIDE: 97 mmol/L — AB (ref 101–111)
CO2: 24 mmol/L (ref 22–32)
Calcium: 9.2 mg/dL (ref 8.9–10.3)
Creatinine, Ser: 0.85 mg/dL (ref 0.44–1.00)
GFR calc Af Amer: >60 mL/min (ref >60)
GFR calc non Af Amer: >60 mL/min (ref >60)
GLUCOSE: 190 mg/dL — AB (ref 65–99)
POTASSIUM: 3.7 mmol/L (ref 3.5–5.1)
SODIUM: 130 mmol/L — AB (ref 135–145)
TOTAL PROTEIN: 7 g/dL (ref 6.5–8.1)

## 2016-02-21 LAB — CREATININE, SERUM
Creatinine, Ser: 1.4 mg/dL — ABNORMAL HIGH (ref 0.44–1.00)
GFR calc Af Amer: 40 mL/min — ABNORMAL LOW (ref >60)
GFR calc non Af Amer: 35 mL/min — ABNORMAL LOW (ref >60)

## 2016-02-21 LAB — CBC
HEMATOCRIT: 38.2 % (ref 36.0–46.0)
HEMOGLOBIN: 13 g/dL (ref 12.0–15.0)
MCH: 28.6 pg (ref 26.0–34.0)
MCHC: 34 g/dL (ref 30.0–36.0)
MCV: 84 fL (ref 78.0–100.0)
Platelets: 182 10*3/uL (ref 150–400)
RBC: 4.55 MIL/uL (ref 3.87–5.11)
RDW: 13.3 % (ref 11.5–15.5)
WBC: 5.6 10*3/uL (ref 4.0–10.5)

## 2016-02-21 LAB — I-STAT ARTERIAL BLOOD GAS, ED
Acid-base deficit: 3 mmol/L — ABNORMAL HIGH (ref 0.0–2.0)
Bicarbonate: 22.1 mmol/L (ref 20.0–28.0)
O2 SAT: 99 %
PCO2 ART: 39.7 mmHg (ref 32.0–48.0)
PO2 ART: 139 mmHg — AB (ref 83.0–108.0)
Patient temperature: 98.6
TCO2: 23 mmol/L (ref 0–100)
pH, Arterial: 7.353 (ref 7.350–7.450)

## 2016-02-21 LAB — BRAIN NATRIURETIC PEPTIDE: B NATRIURETIC PEPTIDE 5: 40.6 pg/mL (ref 0.0–100.0)

## 2016-02-21 LAB — PROCALCITONIN

## 2016-02-21 LAB — MAGNESIUM: Magnesium: 2 mg/dL (ref 1.7–2.4)

## 2016-02-21 LAB — I-STAT TROPONIN, ED: Troponin i, poc: 0.01 ng/mL (ref 0.00–0.08)

## 2016-02-21 MED ORDER — FUROSEMIDE 10 MG/ML IJ SOLN
40.0000 mg | Freq: Once | INTRAMUSCULAR | Status: AC
  Administered 2016-02-21: 40 mg via INTRAVENOUS
  Filled 2016-02-21: qty 4

## 2016-02-21 MED ORDER — PANTOPRAZOLE SODIUM 20 MG PO TBEC
20.0000 mg | DELAYED_RELEASE_TABLET | Freq: Every day | ORAL | Status: DC
  Administered 2016-02-22 – 2016-02-23 (×2): 20 mg via ORAL
  Filled 2016-02-21 (×2): qty 1

## 2016-02-21 MED ORDER — ASPIRIN EC 81 MG PO TBEC
81.0000 mg | DELAYED_RELEASE_TABLET | Freq: Every day | ORAL | Status: DC
  Administered 2016-02-21 – 2016-02-23 (×3): 81 mg via ORAL
  Filled 2016-02-21 (×3): qty 1

## 2016-02-21 MED ORDER — ONDANSETRON HCL 4 MG PO TABS: 4.0000 mg | ORAL_TABLET | Freq: Four times a day (QID) | ORAL | Status: DC | PRN

## 2016-02-21 MED ORDER — TRAZODONE HCL 50 MG PO TABS: 25.0000 mg | ORAL_TABLET | Freq: Every evening | ORAL | Status: DC | PRN

## 2016-02-21 MED ORDER — ACETAMINOPHEN 650 MG RE SUPP: 650.0000 mg | Freq: Four times a day (QID) | RECTAL | Status: DC | PRN

## 2016-02-21 MED ORDER — METHYLPREDNISOLONE SODIUM SUCC 125 MG IJ SOLR
60.0000 mg | Freq: Three times a day (TID) | INTRAMUSCULAR | Status: DC
  Administered 2016-02-21 – 2016-02-22 (×3): 60 mg via INTRAVENOUS
  Filled 2016-02-21 (×3): qty 2

## 2016-02-21 MED ORDER — METHYLPREDNISOLONE SODIUM SUCC 125 MG IJ SOLR: 80.0000 mg | Freq: Three times a day (TID) | INTRAMUSCULAR | Status: DC

## 2016-02-21 MED ORDER — PRAVASTATIN SODIUM 20 MG PO TABS
20.0000 mg | ORAL_TABLET | Freq: Every day | ORAL | Status: DC
  Administered 2016-02-21 – 2016-02-23 (×3): 20 mg via ORAL
  Filled 2016-02-21 (×3): qty 1

## 2016-02-21 MED ORDER — ALBUTEROL SULFATE (2.5 MG/3ML) 0.083% IN NEBU
2.5000 mg | INHALATION_SOLUTION | Freq: Once | RESPIRATORY_TRACT | Status: AC
  Administered 2016-02-21: 2.5 mg via RESPIRATORY_TRACT
  Filled 2016-02-21: qty 3

## 2016-02-21 MED ORDER — ACETAMINOPHEN 325 MG PO TABS: 650.0000 mg | ORAL_TABLET | Freq: Four times a day (QID) | ORAL | Status: DC | PRN

## 2016-02-21 MED ORDER — INSULIN GLARGINE 100 UNIT/ML SOLOSTAR PEN: 36.0000 [IU] | PEN_INJECTOR | Freq: Every day | SUBCUTANEOUS | Status: DC

## 2016-02-21 MED ORDER — ONDANSETRON HCL 4 MG/2ML IJ SOLN: 4.0000 mg | Freq: Four times a day (QID) | INTRAMUSCULAR | Status: DC | PRN

## 2016-02-21 MED ORDER — POLYETHYLENE GLYCOL 3350 17 G PO PACK: 17.0000 g | PACK | Freq: Every day | ORAL | Status: DC | PRN

## 2016-02-21 MED ORDER — INSULIN ASPART 100 UNIT/ML ~~LOC~~ SOLN
0.0000 [IU] | Freq: Three times a day (TID) | SUBCUTANEOUS | Status: DC
  Administered 2016-02-21: 15 [IU] via SUBCUTANEOUS
  Administered 2016-02-22: 7 [IU] via SUBCUTANEOUS
  Administered 2016-02-22: 9 [IU] via SUBCUTANEOUS
  Filled 2016-02-21: qty 1

## 2016-02-21 MED ORDER — INSULIN GLARGINE 100 UNIT/ML ~~LOC~~ SOLN
36.0000 [IU] | Freq: Every day | SUBCUTANEOUS | Status: DC
  Administered 2016-02-21: 36 [IU] via SUBCUTANEOUS
  Filled 2016-02-21: qty 0.36

## 2016-02-21 MED ORDER — LISINOPRIL 10 MG PO TABS
10.0000 mg | ORAL_TABLET | Freq: Every day | ORAL | Status: DC
  Administered 2016-02-22 – 2016-02-23 (×2): 10 mg via ORAL
  Filled 2016-02-21 (×2): qty 1

## 2016-02-21 MED ORDER — FUROSEMIDE 20 MG PO TABS
20.0000 mg | ORAL_TABLET | Freq: Every day | ORAL | Status: DC
  Administered 2016-02-22 – 2016-02-23 (×2): 20 mg via ORAL
  Filled 2016-02-21 (×2): qty 1

## 2016-02-21 MED ORDER — ENOXAPARIN SODIUM 40 MG/0.4ML ~~LOC~~ SOLN
40.0000 mg | SUBCUTANEOUS | Status: DC
  Administered 2016-02-22 – 2016-02-23 (×2): 40 mg via SUBCUTANEOUS
  Filled 2016-02-21 (×2): qty 0.4

## 2016-02-21 MED ORDER — AMLODIPINE BESYLATE 5 MG PO TABS
5.0000 mg | ORAL_TABLET | Freq: Every day | ORAL | Status: DC
  Administered 2016-02-22 – 2016-02-23 (×2): 5 mg via ORAL
  Filled 2016-02-21 (×2): qty 1

## 2016-02-21 MED ORDER — INSULIN ASPART 100 UNIT/ML FLEXPEN: 3.0000 [IU] | PEN_INJECTOR | Freq: Three times a day (TID) | SUBCUTANEOUS | Status: DC

## 2016-02-21 NOTE — ED Notes (Signed)
Dr. Ayesha Rumpf requested that we try patient off Bi-Pap for a few minutes. Put in standby and removed.  O2: 95% RR: 28 Pulse:89

## 2016-02-21 NOTE — ED Triage Notes (Signed)
Pt arrives from home via [facility]. Pt was dx with PNA at PCP yesterday and was started on prednisone and a z pack. Pt woke up in respiratory distress. Pt was pale, cool and diaphoretic was having retractions, wheezing and rales, was using her accessory muscles to breathe and also had pursed lips and nasal flaring. Pt was given 1mg  of atrovent, 125mg  of solumedrol, 15mg  albuterol and 2mg  mag sulfate.

## 2016-02-21 NOTE — ED Notes (Signed)
Previous nurse was unable to give meds at 1245 and 1400.

## 2016-02-21 NOTE — ED Notes (Signed)
Ordered lunch tray 

## 2016-02-21 NOTE — ED Notes (Signed)
Called Dr. Marily Memos regarding patient's CBG of 502. Advised to give patient 15 units.

## 2016-02-21 NOTE — ED Provider Notes (Signed)
[facility] DEPT Provider Note   CSN: [id number] Arrival date & time: 02/21/16  5009     History   Chief Complaint Chief Complaint  Patient presents with  . Respiratory Distress    HPI Joyce Gilmore is a 81 y.o. female.  The history is provided by the patient and the EMS personnel. No language interpreter was used.   Joyce Gilmore is a 81 y.o. female who presents to the Emergency Department complaining of sob.  Level V caveat due to respiratory distress.  History is provided by patient and EMS. Patient reports increased shortness of breath since Monday with cough and generalized body aches as well as chest tightness. EMS reports significant increased work of breathing and worsening shortness of breath over the last 24 hours. She visited her PCP yesterday was her on antibiotics for possible pneumonia. On EMS arrival they report she had increased work of breathing with wheezes and rales. She was given 2 nebulizer treatments as well as Solu-Medrol and mag. They report diaphoresis. She was started on CPAP with improvement in her work of breathing and diaphoresis. Past Medical History:  Diagnosis Date  . Arthritis   . CHF (congestive heart failure) (HCC)    diastolic  . Chronic kidney disease    hx of kidney stones,   . Depression    hx of   . Diabetes mellitus without complication ([place])   . Ganglion, left ankle and foot   . Hearing loss of both ears   . Hypertension   . MRSA infection    Oct 13 - Nov 13  . Osteoporosis   . Tinnitus of both ears     Patient Active Problem List   Diagnosis Date Noted  . Acute respiratory failure ([place]) 02/21/2016  . COPD exacerbation ([place]) 02/21/2016  . Asthmatic bronchitis with acute exacerbation 02/21/2016  . Chronic diastolic heart failure ([place]) 10/12/2015  . SOB (shortness of breath) 10/12/2015  . Shortness of breath 10/12/2015  . Seasonal allergies 11/21/2014  . DDD (degenerative disc disease), lumbar 10/11/2014  . H/O  compression fracture of spine 10/11/2014  . Pulmonary hypertension   . Prosthetic hip infection ([place])   . Hepatic cirrhosis ([place]) 09/24/2014  . Metabolic acidosis 38/18/2993  . SIADH (syndrome of inappropriate ADH production) ([place]) 06/13/2014  . Insomnia 12/28/2013  . H. pylori infection 12/08/2013  . Anemia 07/06/2013  . HLD (hyperlipidemia) 06/27/2013  . Osteoporosis 07/26/2012  . Hyponatremia 05/16/2011  . Hyperkalemia 05/16/2011  . Type 2 diabetes mellitus with complication, without long-term current use of insulin ([place]) 08/17/2006  . Essential hypertension 08/17/2006  . DIVERTICULOSIS, COLON 08/17/2006    Past Surgical History:  Procedure Laterality Date  . BACK SURGERY     history restored after error with record merge      . INCISION AND DRAINAGE HIP  12/22/2011   Procedure: IRRIGATION AND DEBRIDEMENT HIP;  Surgeon: Mcarthur Rossetti, MD;  Location: [facility];  Service: Orthopedics;  Laterality: Right;  Irrigation and debridement right hip  . INCISION AND DRAINAGE HIP  12/27/2011   Procedure: IRRIGATION AND DEBRIDEMENT HIP;  Surgeon: Mcarthur Rossetti, MD;  Location: [facility];  Service: Orthopedics;  Laterality: Right;  Repeat irrigation and debridement  . JOINT REPLACEMENT     bilateral knee, right hip   . TOTAL HIP REVISION  12/05/2011   Procedure: TOTAL HIP REVISION;  Surgeon: Mcarthur Rossetti, MD;  Location: [facility];  Service: Orthopedics;  Laterality: Right;  Right Hip Revision Arthroplasty to  Total Hip, Excision of Old Implant    OB History    No data available       Home Medications    Prior to Admission medications   Medication Sig Start Date End Date Taking? Authorizing Provider  ACCU-CHEK AVIVA PLUS test strip USE TO CHECK BLOOD SUGAR FOUR TIMES DAILY AS DIRECTED 01/16/16  Yes Alycia Rossetti, MD  ACCU-CHEK SOFTCLIX LANCETS lancets USE AS DIRECTED 01/16/16  Yes Alycia Rossetti, MD  albuterol (PROVENTIL HFA;VENTOLIN HFA) 108 (90 Base) MCG/ACT  inhaler Inhale 1-2 puffs into the lungs every 4 (four) hours as needed for wheezing or shortness of breath. 01/08/16  Yes Alycia Rossetti, MD  amLODipine (NORVASC) 5 MG tablet Take 1 tablet (5 mg total) by mouth daily. 08/08/15  Yes Alycia Rossetti, MD  azithromycin (ZITHROMAX) 250 MG tablet Day 1: Take 2 daily. Days 2-5: Take 1 daily. 02/20/16  Yes Mary B Dixon, PA-C  B-D ULTRAFINE III SHORT PEN 31G X 8 MM MISC USE AS DIRECTED 07/05/15  Yes Alycia Rossetti, MD  benzonatate (TESSALON) 100 MG capsule TAKE 1 CAPSULE(100 MG) BY MOUTH TWICE DAILY AS NEEDED FOR COUGH 12/17/15  Yes Alycia Rossetti, MD  blood glucose meter kit and supplies KIT Dispense based on patient and insurance preference. Use up to four times daily as directed. (FOR ICD-9 250.00, 250.01). 04/25/14  Yes Orlena Sheldon, PA-C  furosemide (LASIX) 20 MG tablet Take 1 tablet (20 mg total) by mouth daily. For fluid - spanish speaking 11/07/15  Yes Alycia Rossetti, MD  guaiFENesin-codeine 100-10 MG/5ML syrup Take 5 mLs by mouth every 6 (six) hours as needed. 01/08/16  Yes Alycia Rossetti, MD  insulin aspart (NOVOLOG FLEXPEN) 100 UNIT/ML FlexPen FOLLOW SLIDING SCALE AS DIRECTED Patient taking differently: Inject 3 Units into the skin 3 (three) times daily with meals.  08/08/15  Yes Alycia Rossetti, MD  Insulin Glargine (LANTUS SOLOSTAR) 100 UNIT/ML Solostar Pen Inject 36 Units into the skin at bedtime. 08/08/15  Yes Alycia Rossetti, MD  lansoprazole (PREVACID) 30 MG capsule TAKE 1 CAPSULE BY MOUTH TWICE DAILY BEFORE A MEAL Patient taking differently: Take 30 mg by mouth 2 (two) times daily before a meal.  08/08/15  Yes Alycia Rossetti, MD  lisinopril (PRINIVIL,ZESTRIL) 10 MG tablet Take 1 tablet (10 mg total) by mouth daily. 08/08/15  Yes Alycia Rossetti, MD  metoprolol tartrate (LOPRESSOR) 25 MG tablet Take 1 tablet (25 mg total) by mouth 2 (two) times daily. 08/08/15  Yes Alycia Rossetti, MD  nystatin cream (MYCOSTATIN) Apply 1 application  topically 2 (two) times daily. 12/17/15  Yes Alycia Rossetti, MD  pravastatin (PRAVACHOL) 20 MG tablet Take 1 tablet (20 mg total) by mouth daily. 08/08/15  Yes Alycia Rossetti, MD  predniSONE (DELTASONE) 20 MG tablet Take 1 tablet (20 mg total) by mouth daily with breakfast. 02/20/16  Yes Orlena Sheldon, PA-C  sucralfate (CARAFATE) 1 g tablet Take 1 tablet (1 g total) by mouth 4 (four) times daily. 08/08/15  Yes Alycia Rossetti, MD    Family History Family History  Problem Relation Age of Onset  . Hypertension Mother   . Colon cancer Neg Hx     Social History Social History  Substance Use Topics  . Smoking status: Never Smoker  . Smokeless tobacco: Never Used  . Alcohol use No     Allergies   Bactrim [sulfamethoxazole-trimethoprim]; Rifampin; and Vancomycin   Review of Systems  Review of Systems  All other systems reviewed and are negative.    Physical Exam Updated Vital Signs BP 145/74   Pulse 105   Resp 17   SpO2 97%   Physical Exam  Constitutional: She is oriented to person, place, and time. She appears well-developed and well-nourished.  HENT:  Head: Normocephalic and atraumatic.  Cardiovascular: Normal rate and regular rhythm.   No murmur heard. Pulmonary/Chest: Effort normal. No respiratory distress.  Tachypnea with diffuse wheezes bilaterally, accessory muscle use  Abdominal: Soft. There is no tenderness. There is no rebound and no guarding.  Musculoskeletal: She exhibits no tenderness.  Trace pitting edema to BLE  Neurological: She is alert and oriented to person, place, and time.  Skin: Skin is warm and dry.  Psychiatric: She has a normal mood and affect. Her behavior is normal.  Nursing note and vitals reviewed.    ED Treatments / Results  Labs (all labs ordered are listed, but only abnormal results are displayed) Labs Reviewed  COMPREHENSIVE METABOLIC PANEL - Abnormal; Notable for the following:       Result Value   Sodium 130 (*)    Chloride  97 (*)    Glucose, Bld 190 (*)    All other components within normal limits  CBC WITH DIFFERENTIAL/PLATELET - Abnormal; Notable for the following:    Platelets 147 (*)    All other components within normal limits  I-STAT ARTERIAL BLOOD GAS, ED - Abnormal; Notable for the following:    pO2, Arterial 139.0 (*)    Acid-base deficit 3.0 (*)    All other components within normal limits  RESPIRATORY PANEL BY PCR  BRAIN NATRIURETIC PEPTIDE  CBC  CREATININE, SERUM  MAGNESIUM  HEMOGLOBIN A1C  PROCALCITONIN  BASIC METABOLIC PANEL  CBC  I-STAT TROPOININ, ED    EKG  EKG Interpretation  Date/Time:  Thursday February 21 2016 09:39:18 EST Ventricular Rate:  76 PR Interval:    QRS Duration: 97 QT Interval:  381 QTC Calculation: 429 R Axis:   55 Text Interpretation:  Sinus rhythm Low voltage, precordial leads Confirmed by Hazle Coca [phone]) on 02/21/2016 10:25:47 AM       Radiology Dg Chest Port 1 View  Result Date: 02/21/2016 CLINICAL DATA:  Shortness of breath, wheezing, difficulty breathing EXAM: PORTABLE CHEST 1 VIEW COMPARISON:  Portable chest x-ray of 10/12/2015 FINDINGS: No definite pneumonia is seen. There is cardiomegaly however with somewhat prominent pulmonary vascularity suggesting mild pulmonary vascular congestion. A small left pleural effusion cannot be excluded. No bony abnormality is seen. IMPRESSION: 1. No definite pneumonia. 2. Cannot exclude mild pulmonary vascular congestion with cardiomegaly. Electronically Signed   By: Ivar Drape M.D.   On: 02/21/2016 10:35    Procedures Procedures (including critical care time)  Medications Ordered in ED Medications  furosemide (LASIX) tablet 20 mg (not administered)  amLODipine (NORVASC) tablet 5 mg (not administered)  Insulin Glargine (LANTUS) Solostar Pen 36 Units (not administered)  pantoprazole (PROTONIX) EC tablet 20 mg (not administered)  lisinopril (PRINIVIL,ZESTRIL) tablet 10 mg (not administered)  pravastatin  (PRAVACHOL) tablet 20 mg (not administered)  acetaminophen (TYLENOL) tablet 650 mg (not administered)    Or  acetaminophen (TYLENOL) suppository 650 mg (not administered)  traZODone (DESYREL) tablet 25 mg (not administered)  aspirin EC tablet 81 mg (not administered)  polyethylene glycol (MIRALAX / GLYCOLAX) packet 17 g (not administered)  ondansetron (ZOFRAN) tablet 4 mg (not administered)    Or  ondansetron (ZOFRAN) injection 4 mg (not administered)  enoxaparin (LOVENOX) injection 30 mg (not administered)  furosemide (LASIX) injection 40 mg (not administered)  insulin aspart (novoLOG) injection 0-9 Units (not administered)  methylPREDNISolone sodium succinate (SOLU-MEDROL) 125 mg/2 mL injection 80 mg (not administered)  albuterol (PROVENTIL) (2.5 MG/3ML) 0.083% nebulizer solution 2.5 mg (2.5 mg Nebulization Given 02/21/16 1131)     Initial Impression / Assessment and Plan / ED Course  I have reviewed the triage vital signs and the nursing notes.  Pertinent labs & imaging results that were available during my care of the patient were reviewed by me and considered in my medical decision making (see chart for details).  Clinical Course     Patient with history of pulmonary disease and CHF here with increased shortness of breath. She had significant respiratory distress for EMS. Following treatments by EMS and BiPAP she has improved in the emergency department. BiPAP was discontinued. Her accessory muscle use has resolved but she does have persistent wheezes, provided additional albuterol treatment. Hospitalist was contacted for admission for acute COPD exacerbation.  Final Clinical Impressions(s) / ED Diagnoses   Final diagnoses:  None    New Prescriptions New Prescriptions   No medications on file     Quintella Reichert, MD 02/21/16 1734

## 2016-02-21 NOTE — ED Notes (Addendum)
Spoke with Dr. Marily Memos regarding missed meds at 1245 and 1400.  He said to hold off on the lisinopril and Norvasc but do go ahead and give Solu-Medrol, Pravastatin, Lasix, Aspirin, Protonix, and Lovenox.

## 2016-02-21 NOTE — H&P (Signed)
History and Physical    Joyce Gilmore TTS:[id number] DOB: May 26, 1934 DOA: 02/21/2016  PCP: Vic Blackbird, MD   Patient coming from: home  Chief Complaint: acute SOB  HPI: Joyce Gilmore is a 81 y.o. female with medical history significant of diabetes mellitus type 2, hypertension, hyperlipidemia, anemia, mild diastolic CHF by echocardiogram in August 2017, pulmonary hypertension who presented to the emergency department via EMS on CPAP with complaints of severe dyspnea. Her son was present at bedside and told me that approximately 3 days ago patient developed progressive shortness of breath and weakness, she was seen by PCP who prescribed Zithromax and oral this morning when EMS arrived they found patient patient being hypoxic, was increased work of breathing using accessory abdominal muscles. They gave her 2 g of magnesium IV, Solu-Medrol 125 mg, continued albuterol nebulizer treatment, placed her on CPAP and delivered to the ED for further evaluation and treatment. Patient denied chest pain or productive cough, denied subjective fevers, complained of weakness during the last couple of days, aggressive shortness of breath, no nausea vomiting, no abdominal pain dysuria or hematuria.  ED Course: Upon arrival patient was placed on BiPAP, received nebulizer treatment and gradually heavier respiratory status improved to the point she was able to be weaned off BiPAP. ABG on admission showed normal pH 7.35, no CO2 retention, elevated PO2 139 mmHg Chest x-ray showed no definite pneumonia, cardiomegaly, suspicious for mild pulmonary vascular congestion  Blood work showed normal white blood cells count, mild thrombocytopenia at 147 and mild hyponatremia of 130, which is not new    Review of Systems: As per HPI otherwise 10 point review of systems negative.   Ambulatory Status: Independent  Past Medical History:  Diagnosis Date  . Arthritis   . CHF (congestive heart failure) (HCC)    diastolic  . Chronic kidney disease    hx of kidney stones,   . Depression    hx of   . Diabetes mellitus without complication ([place])   . Ganglion, left ankle and foot   . Hearing loss of both ears   . Hypertension   . MRSA infection    Oct 13 - Nov 13  . Osteoporosis   . Tinnitus of both ears     Past Surgical History:  Procedure Laterality Date  . BACK SURGERY     history restored after error with record merge      . INCISION AND DRAINAGE HIP  12/22/2011   Procedure: IRRIGATION AND DEBRIDEMENT HIP;  Surgeon: Mcarthur Rossetti, MD;  Location: [facility];  Service: Orthopedics;  Laterality: Right;  Irrigation and debridement right hip  . INCISION AND DRAINAGE HIP  12/27/2011   Procedure: IRRIGATION AND DEBRIDEMENT HIP;  Surgeon: Mcarthur Rossetti, MD;  Location: [facility];  Service: Orthopedics;  Laterality: Right;  Repeat irrigation and debridement  . JOINT REPLACEMENT     bilateral knee, right hip   . TOTAL HIP REVISION  12/05/2011   Procedure: TOTAL HIP REVISION;  Surgeon: Mcarthur Rossetti, MD;  Location: [facility];  Service: Orthopedics;  Laterality: Right;  Right Hip Revision Arthroplasty to Total Hip, Excision of Old Implant    Social History   Social History  . Marital status: Widowed    Spouse name: N/A  . Number of children: 7  . Years of education: N/A   Occupational History  . Not on file.   Social History Main Topics  . Smoking status: Never Smoker  . Smokeless tobacco: Never  Used  . Alcohol use No  . Drug use: No  . Sexual activity: No   Other Topics Concern  . Not on file   Social History Narrative   ** Merged History Encounter **        Allergies  Allergen Reactions  . Bactrim [Sulfamethoxazole-Trimethoprim]     Hyperkalemia  . Rifampin Other (See Comments)    Severe thrombocytopenia  . Vancomycin Other (See Comments)    Severe thrombocytopenia    Family History  Problem Relation Age of Onset  . Hypertension Mother   . Colon cancer  Neg Hx     Prior to Admission medications   Medication Sig Start Date End Date Taking? Authorizing Provider  ACCU-CHEK AVIVA PLUS test strip USE TO CHECK BLOOD SUGAR FOUR TIMES DAILY AS DIRECTED 01/16/16   Alycia Rossetti, MD  ACCU-CHEK SOFTCLIX LANCETS lancets USE AS DIRECTED 01/16/16   Alycia Rossetti, MD  albuterol (PROVENTIL HFA;VENTOLIN HFA) 108 (90 Base) MCG/ACT inhaler Inhale 1-2 puffs into the lungs every 4 (four) hours as needed for wheezing or shortness of breath. 01/08/16   Alycia Rossetti, MD  amLODipine (NORVASC) 5 MG tablet Take 1 tablet (5 mg total) by mouth daily. 08/08/15   Alycia Rossetti, MD  azithromycin (ZITHROMAX) 250 MG tablet Day 1: Take 2 daily. Days 2-5: Take 1 daily. 02/20/16   Lonie Peak Dixon, PA-C  B-D ULTRAFINE III SHORT PEN 31G X 8 MM MISC USE AS DIRECTED 07/05/15   Alycia Rossetti, MD  benzonatate (TESSALON) 100 MG capsule TAKE 1 CAPSULE(100 MG) BY MOUTH TWICE DAILY AS NEEDED FOR COUGH 12/17/15   Alycia Rossetti, MD  blood glucose meter kit and supplies KIT Dispense based on patient and insurance preference. Use up to four times daily as directed. (FOR ICD-9 250.00, 250.01). 04/25/14   Orlena Sheldon, PA-C  furosemide (LASIX) 20 MG tablet Take 1 tablet (20 mg total) by mouth daily. For fluid - spanish speaking 11/07/15   Alycia Rossetti, MD  guaiFENesin-codeine 100-10 MG/5ML syrup Take 5 mLs by mouth every 6 (six) hours as needed. 01/08/16   Alycia Rossetti, MD  insulin aspart (NOVOLOG FLEXPEN) 100 UNIT/ML FlexPen FOLLOW SLIDING SCALE AS DIRECTED Patient taking differently: Inject 3 Units into the skin 3 (three) times daily with meals.  08/08/15   Alycia Rossetti, MD  Insulin Glargine (LANTUS SOLOSTAR) 100 UNIT/ML Solostar Pen Inject 36 Units into the skin at bedtime. 08/08/15   Alycia Rossetti, MD  lansoprazole (PREVACID) 30 MG capsule TAKE 1 CAPSULE BY MOUTH TWICE DAILY BEFORE A MEAL Patient taking differently: Take 30 mg by mouth 2 (two) times daily before a meal.   08/08/15   Alycia Rossetti, MD  LANTUS SOLOSTAR 100 UNIT/ML Solostar Pen INJECT 36 UNITS INTO THE SKIN EVERY NIGHT AT BEDTIME 01/07/16   Alycia Rossetti, MD  lisinopril (PRINIVIL,ZESTRIL) 10 MG tablet Take 1 tablet (10 mg total) by mouth daily. 08/08/15   Alycia Rossetti, MD  metoprolol tartrate (LOPRESSOR) 25 MG tablet Take 1 tablet (25 mg total) by mouth 2 (two) times daily. 08/08/15   Alycia Rossetti, MD  nystatin cream (MYCOSTATIN) Apply 1 application topically 2 (two) times daily. 12/17/15   Alycia Rossetti, MD  pravastatin (PRAVACHOL) 20 MG tablet Take 1 tablet (20 mg total) by mouth daily. 08/08/15   Alycia Rossetti, MD  predniSONE (DELTASONE) 20 MG tablet Take 2 tablets (40 mg total) by mouth daily with breakfast.  01/08/16   Alycia Rossetti, MD  predniSONE (DELTASONE) 20 MG tablet Take 1 tablet (20 mg total) by mouth daily with breakfast. 02/20/16   Orlena Sheldon, PA-C  sucralfate (CARAFATE) 1 g tablet Take 1 tablet (1 g total) by mouth 4 (four) times daily. 08/08/15   Alycia Rossetti, MD    Physical Exam: Vitals:   02/21/16 1000 02/21/16 1015 02/21/16 1030 02/21/16 1115  BP: 151/76 143/95 (!) 159/148 117/97  Pulse: 79 81 79 88  Resp: 18 (!) _0 SpO2: 100% 100% 100% 95%     General: Moderate respiratory distress sitting in bed, unable to speak full sentences due to severe dyspnea  Eyes: PERRLA, EOMI, normal lids, iris ENT:  grossly normal hearing, lips & tongue, mucous membranes moist and intact Neck: no lymphoadenopathy, masses or thyromegaly Cardiovascular: RRR, no m/r/g. No JVD, carotid bruits. No LE edema.  Respiratory: bilateral diffuse  wheezes and with crackles; increased respiratory effortwith accessory muscle involvement Abdomen: soft, non-tender, non-distended, no organomegaly or masses appreciated. BS present in all quadrants Skin: no rash, ulcers or induration seen on limited exam Musculoskeletal: grossly normal tone BUE/BLE, good ROM, no bony abnormality or  joint deformities observed Psychiatric: grossly normal mood and affect, speech fluent and appropriate, alert and oriented x3 Neurologic: CN II-XII grossly intact, moves all extremities in coordinated fashion, sensation intact  Labs on Admission: I have personally reviewed following labs and imaging studies  CBC, BMP, BNP  GFR: Estimated Creatinine Clearance: 42.3 mL/min (by C-G formula based on SCr of 0.85 mg/dL).   Creatinine Clearance: Estimated Creatinine Clearance: 42.3 mL/min (by C-G formula based on SCr of 0.85 mg/dL).    Radiological Exams on Admission: Dg Chest Port 1 View  Result Date: 02/21/2016 CLINICAL DATA:  Shortness of breath, wheezing, difficulty breathing EXAM: PORTABLE CHEST 1 VIEW COMPARISON:  Portable chest x-ray of 10/12/2015 FINDINGS: No definite pneumonia is seen. There is cardiomegaly however with somewhat prominent pulmonary vascularity suggesting mild pulmonary vascular congestion. A small left pleural effusion cannot be excluded. No bony abnormality is seen. IMPRESSION: 1. No definite pneumonia. 2. Cannot exclude mild pulmonary vascular congestion with cardiomegaly. Electronically Signed   By: Ivar Drape M.D.   On: 02/21/2016 10:35    EKG: Independently reviewed - sinus rhythm, nonspecific ST-T wave changes, low R wave voltage Assessment/Plan Principal Problem:   Asthmatic bronchitis with acute exacerbation Active Problems:   Type 2 diabetes mellitus with complication, without long-term current use of insulin (HCC)   Essential hypertension   HLD (hyperlipidemia)   Chronic diastolic heart failure (HCC)   COPD exacerbation (HCC)    Asthmatic bronchitis with acute exacerbation Continue nebulizer treatment, supplemental oxygen, Solu-Medrol IV Hold beta blocker-Lopressor while patient is with active wheezing Patient would benefit from PFTs in the ambulatory setting Might consider adding Pulmicort and and Singulair tdaily to her home rescue albuterol    Will check reparatory viral panel and Procalcitonin. If Procalcitonin is elevated, she might need antibiotic therapy, although WBC's count on admission is WNL  DM type II - most recent HgbA1C is 9.5% in August 2017 Continue home dose insulin Continue diabetic diet, monitor FSBS QACHS, add sliding scale insulin  Diastolic CHF Patient does not look FVO clinically, but given her respiratory status, will give one dose Lasix IV 40 mg  Restart oral Lasix at home doses tomorrow  Hyponatremia Recheck BMP tomorrow  Hypertension - currently stable Continue home medication and adjust the doses if needed depending on the  BP readings Beta blocker is on hold d/t wheezing Hydralazine IV prn ordered   DVT prophylaxis: Lovenox   Code Status: Full  Family Communication: Son at bedside Disposition Plan: Stepdown unit Consults called: None Admission status: Observation   Caprice Red Pager: [phone] [facility]  If 7PM-7AM, please contact night-coverage [email or web address] Password [facility]  02/21/2016, 12:13 PM

## 2016-02-22 DIAGNOSIS — Z7952 Long term (current) use of systemic steroids: Secondary | ICD-10-CM | POA: Diagnosis not present

## 2016-02-22 DIAGNOSIS — E785 Hyperlipidemia, unspecified: Secondary | ICD-10-CM | POA: Diagnosis present

## 2016-02-22 DIAGNOSIS — M810 Age-related osteoporosis without current pathological fracture: Secondary | ICD-10-CM | POA: Diagnosis present

## 2016-02-22 DIAGNOSIS — N189 Chronic kidney disease, unspecified: Secondary | ICD-10-CM | POA: Diagnosis present

## 2016-02-22 DIAGNOSIS — I130 Hypertensive heart and chronic kidney disease with heart failure and stage 1 through stage 4 chronic kidney disease, or unspecified chronic kidney disease: Secondary | ICD-10-CM | POA: Diagnosis present

## 2016-02-22 DIAGNOSIS — J441 Chronic obstructive pulmonary disease with (acute) exacerbation: Secondary | ICD-10-CM | POA: Diagnosis present

## 2016-02-22 DIAGNOSIS — Z881 Allergy status to other antibiotic agents status: Secondary | ICD-10-CM | POA: Diagnosis not present

## 2016-02-22 DIAGNOSIS — E1165 Type 2 diabetes mellitus with hyperglycemia: Secondary | ICD-10-CM | POA: Diagnosis present

## 2016-02-22 DIAGNOSIS — E871 Hypo-osmolality and hyponatremia: Secondary | ICD-10-CM | POA: Diagnosis present

## 2016-02-22 DIAGNOSIS — I10 Essential (primary) hypertension: Secondary | ICD-10-CM | POA: Diagnosis not present

## 2016-02-22 DIAGNOSIS — H9193 Unspecified hearing loss, bilateral: Secondary | ICD-10-CM | POA: Diagnosis present

## 2016-02-22 DIAGNOSIS — Z794 Long term (current) use of insulin: Secondary | ICD-10-CM | POA: Diagnosis not present

## 2016-02-22 DIAGNOSIS — Z79899 Other long term (current) drug therapy: Secondary | ICD-10-CM | POA: Diagnosis not present

## 2016-02-22 DIAGNOSIS — I5032 Chronic diastolic (congestive) heart failure: Secondary | ICD-10-CM | POA: Diagnosis not present

## 2016-02-22 DIAGNOSIS — R0602 Shortness of breath: Secondary | ICD-10-CM | POA: Diagnosis present

## 2016-02-22 DIAGNOSIS — E1122 Type 2 diabetes mellitus with diabetic chronic kidney disease: Secondary | ICD-10-CM | POA: Diagnosis present

## 2016-02-22 DIAGNOSIS — B974 Respiratory syncytial virus as the cause of diseases classified elsewhere: Secondary | ICD-10-CM | POA: Diagnosis present

## 2016-02-22 DIAGNOSIS — I272 Pulmonary hypertension, unspecified: Secondary | ICD-10-CM | POA: Diagnosis present

## 2016-02-22 DIAGNOSIS — J4521 Mild intermittent asthma with (acute) exacerbation: Secondary | ICD-10-CM | POA: Diagnosis not present

## 2016-02-22 LAB — RESPIRATORY PANEL BY PCR
ADENOVIRUS-RVPPCR: NOT DETECTED
Bordetella pertussis: NOT DETECTED
CHLAMYDOPHILA PNEUMONIAE-RVPPCR: NOT DETECTED
CORONAVIRUS 229E-RVPPCR: NOT DETECTED
CORONAVIRUS HKU1-RVPPCR: NOT DETECTED
CORONAVIRUS NL63-RVPPCR: NOT DETECTED
Coronavirus OC43: NOT DETECTED
Influenza A: NOT DETECTED
Influenza B: NOT DETECTED
Metapneumovirus: NOT DETECTED
Mycoplasma pneumoniae: NOT DETECTED
Parainfluenza Virus 1: NOT DETECTED
Parainfluenza Virus 2: NOT DETECTED
Parainfluenza Virus 3: NOT DETECTED
Parainfluenza Virus 4: NOT DETECTED
Respiratory Syncytial Virus: DETECTED — AB
Rhinovirus / Enterovirus: NOT DETECTED

## 2016-02-22 LAB — BASIC METABOLIC PANEL
Anion gap: 10 (ref 5–15)
BUN: 22 mg/dL — AB (ref 6–20)
CO2: 24 mmol/L (ref 22–32)
CREATININE: 1.12 mg/dL — AB (ref 0.44–1.00)
Calcium: 9.5 mg/dL (ref 8.9–10.3)
Chloride: 95 mmol/L — ABNORMAL LOW (ref 101–111)
GFR calc Af Amer: 53 mL/min — ABNORMAL LOW (ref >60)
GFR, EST NON AFRICAN AMERICAN: 45 mL/min — AB (ref >60)
Glucose, Bld: 298 mg/dL — ABNORMAL HIGH (ref 65–99)
POTASSIUM: 4.2 mmol/L (ref 3.5–5.1)
SODIUM: 129 mmol/L — AB (ref 135–145)

## 2016-02-22 LAB — GLUCOSE, CAPILLARY
GLUCOSE-CAPILLARY: 222 mg/dL — AB (ref 65–99)
GLUCOSE-CAPILLARY: 240 mg/dL — AB (ref 65–99)
GLUCOSE-CAPILLARY: 280 mg/dL — AB (ref 65–99)
Glucose-Capillary: 502 mg/dL (ref 65–99)
Glucose-Capillary: 314 mg/dL — ABNORMAL HIGH (ref 65–99)
Glucose-Capillary: 477 mg/dL — ABNORMAL HIGH (ref 65–99)
Glucose-Capillary: 426 mg/dL — ABNORMAL HIGH (ref 65–99)

## 2016-02-22 LAB — CBC
HEMATOCRIT: 37.1 % (ref 36.0–46.0)
Hemoglobin: 12.8 g/dL (ref 12.0–15.0)
MCH: 28.9 pg (ref 26.0–34.0)
MCHC: 34.5 g/dL (ref 30.0–36.0)
MCV: 83.7 fL (ref 78.0–100.0)
PLATELETS: 184 10*3/uL (ref 150–400)
RBC: 4.43 MIL/uL (ref 3.87–5.11)
RDW: 13.1 % (ref 11.5–15.5)
WBC: 7.5 10*3/uL (ref 4.0–10.5)

## 2016-02-22 LAB — HEMOGLOBIN A1C
Hgb A1c MFr Bld: 7.5 % — ABNORMAL HIGH (ref 4.8–5.6)
MEAN PLASMA GLUCOSE: 169 mg/dL

## 2016-02-22 LAB — GLUCOSE, RANDOM: Glucose, Bld: 520 mg/dL (ref 65–99)

## 2016-02-22 MED ORDER — INSULIN GLARGINE 100 UNIT/ML ~~LOC~~ SOLN
45.0000 [IU] | Freq: Every day | SUBCUTANEOUS | Status: DC
  Administered 2016-02-22: 45 [IU] via SUBCUTANEOUS
  Filled 2016-02-22: qty 0.45

## 2016-02-22 MED ORDER — PREDNISONE 10 MG PO TABS
30.0000 mg | ORAL_TABLET | Freq: Every day | ORAL | Status: DC
  Administered 2016-02-23: 07:00:00 30 mg via ORAL
  Filled 2016-02-22: qty 1

## 2016-02-22 MED ORDER — DICLOFENAC SODIUM 1 % TD GEL
2.0000 g | Freq: Four times a day (QID) | TRANSDERMAL | Status: DC
  Administered 2016-02-22 – 2016-02-23 (×4): 2 g via TOPICAL
  Filled 2016-02-22: qty 100

## 2016-02-22 MED ORDER — INSULIN ASPART 100 UNIT/ML ~~LOC~~ SOLN
0.0000 [IU] | Freq: Three times a day (TID) | SUBCUTANEOUS | Status: DC
  Administered 2016-02-22 – 2016-02-23 (×2): 7 [IU] via SUBCUTANEOUS
  Administered 2016-02-23: 4 [IU] via SUBCUTANEOUS

## 2016-02-22 MED ORDER — INSULIN ASPART 100 UNIT/ML ~~LOC~~ SOLN: 18.0000 [IU] | Freq: Once | SUBCUTANEOUS | Status: DC

## 2016-02-22 MED ORDER — METOPROLOL TARTRATE 25 MG PO TABS
25.0000 mg | ORAL_TABLET | Freq: Two times a day (BID) | ORAL | Status: DC
  Administered 2016-02-22 – 2016-02-23 (×3): 25 mg via ORAL
  Filled 2016-02-22 (×3): qty 1

## 2016-02-22 MED ORDER — INSULIN ASPART 100 UNIT/ML ~~LOC~~ SOLN
9.0000 [IU] | Freq: Once | SUBCUTANEOUS | Status: AC
  Administered 2016-02-22: 9 [IU] via SUBCUTANEOUS

## 2016-02-22 MED ORDER — INSULIN ASPART 100 UNIT/ML ~~LOC~~ SOLN
20.0000 [IU] | Freq: Once | SUBCUTANEOUS | Status: AC
  Administered 2016-02-22: 20 [IU] via SUBCUTANEOUS

## 2016-02-22 NOTE — Progress Notes (Signed)
Pt walked with 1 assist in hall baseline 02 95 desat to 91 with exertion  half way down hall.

## 2016-02-22 NOTE — Progress Notes (Signed)
PROGRESS NOTE                                                                                                                                                                                                             Patient Demographics:    Joyce Gilmore, is a 81 y.o. female, DOB - 10/10/1934, UA:[id number]  Admit date - 02/21/2016   Admitting Physician Waldemar Dickens, MD  Outpatient Primary MD for the patient is Vic Blackbird, MD  LOS - 0   Chief Complaint  Patient presents with  . Respiratory Distress       Brief Narrative   81 y.o. female with medical history significant of diabetes mellitus type 2, hypertension, hyperlipidemia, anemia, mild diastolic CHF by echocardiogram in August 2017, pulmonary hypertension who presented to the emergency department via EMS on CPAP with complaints of severe dyspnea,Admitted for asthma exacerbation, most likely provoked by a viral bronchitis   Subjective:    Joyce Gilmore today has, No headache, No chest pain, No abdominal pain - No Nausea,Report breathing feels better today, reports occasional dry cough.  Assessment  & Plan :    Principal Problem:   Asthmatic bronchitis with acute exacerbation Active Problems:   Type 2 diabetes mellitus with complication, without long-term current use of insulin (HCC)   Essential hypertension   HLD (hyperlipidemia)   Chronic diastolic heart failure (HCC)   COPD exacerbation (HCC)   Asthmatic bronchitis with acute exacerbation - Patient followed by pulmonary Dr. Synetta Fail as an outpatient, with known diagnosis of asthma. - Presents with URI symptoms, with significant wheezing on admission, asthma exacerbation most likely provoked by viral illness. - Significantly improved with IV Solu-Medrol, will transition to by mouth prednisone in a.m. - Follow-up respiratory virus panel  DM type II  -  most recent HgbA1C is 9.5% in August 2017 - CBG significantly  uncontrolled, this is most likely due to steroids, even multiple doses of NovoLog during the day, will increase her insulin sliding scale from sensitive to resistant, will increase her Lantus from 36-45 units at bedtime.  Diastolic CHF - Appears euvolemic, continue with home dose Lasix  Pseudo-Hyponatremia - Secondary to hyperglycemia   Hypertensionb - Continue home medication and adjust the doses if needed depending on the BP readings     Code Status : Full  Family Communication  :  Daughter at bedside  Disposition Plan  : Home tomorrow if CBG controlled, and respiratory status continues to improve   Consults  :  None  Procedures  : None   DVT Prophylaxis  :  Lovenox -  Lab Results  Component Value Date   PLT 184 02/22/2016    Antibiotics  :    Anti-infectives    None        Objective:   Vitals:   02/21/16 2044 02/22/16 0028 02/22/16 0701 02/22/16 1244  BP: (!) 151/72 (!) 126/55 (!) 152/83 130/65  Pulse: (!) 103 93 92 95  Resp: 20 20 20  (!) 21  Temp: 99.3 F (37.4 C) 98.9 F (37.2 C) 98.7 F (37.1 C) 98.2 F (36.8 C)  TempSrc: Oral Oral Oral Oral  SpO2: 97% 96% 96% 94%  Weight: 69.5 kg (153 lb 3.2 oz)  69 kg (152 lb 1.6 oz)     Wt Readings from Last 3 Encounters:  02/22/16 69 kg (152 lb 1.6 oz)  02/20/16 70.3 kg (155 lb)  01/25/16 71.3 kg (157 lb 3.2 oz)     Intake/Output Summary (Last 24 hours) at 02/22/16 1401 Last data filed at 02/22/16 1000  Gross per 24 hour  Intake              320 ml  Output              850 ml  Net             -530 ml     Physical Exam  Awake Alert, Oriented X 3,  [place].AT,PERRAL Supple Neck,No JVD, No cervical lymphadenopathy appriciated.  Symmetrical Chest wall movement, Good air movement bilaterally, CTAB RRR,No Gallops,Rubs or new Murmurs, No Parasternal Heave +ve B.Sounds, Abd Soft, No tenderness, , No rebound - guarding or rigidity. No Cyanosis, Clubbing or edema, No new Rash or bruise     Data Review:      CBC  Recent Labs Lab 02/21/16 0946 02/21/16 2049 02/22/16 0242  WBC 6.4 5.6 7.5  HGB 12.4 13.0 12.8  HCT 36.8 38.2 37.1  PLT 147* 182 184  MCV 85.2 84.0 83.7  MCH 28.7 28.6 28.9  MCHC 33.7 34.0 34.5  RDW 13.3 13.3 13.1  LYMPHSABS 2.8  --   --   MONOABS 0.6  --   --   EOSABS 0.1  --   --   BASOSABS 0.0  --   --     Chemistries   Recent Labs Lab 02/21/16 0946 02/21/16 2049 02/22/16 0242 02/22/16 1226  NA 130*  --  129*  --   K 3.7  --  4.2  --   CL 97*  --  95*  --   CO2 24  --  24  --   GLUCOSE 190*  --  298* 520*  BUN 8  --  22*  --   CREATININE 0.85 1.40* 1.12*  --   CALCIUM 9.2  --  9.5  --   MG  --  2.0  --   --   AST 23  --   --   --   ALT 16  --   --   --   ALKPHOS 69  --   --   --   BILITOT 0.4  --   --   --    ------------------------------------------------------------------------------------------------------------------ No results for input(s): CHOL, HDL, LDLCALC, TRIG, CHOLHDL, LDLDIRECT in the last 72 hours.  Lab Results  Component Value Date  HGBA1C 9.5 (H) 10/10/2015   ------------------------------------------------------------------------------------------------------------------ No results for input(s): TSH, T4TOTAL, T3FREE, THYROIDAB in the last 72 hours.  Invalid input(s): FREET3 ------------------------------------------------------------------------------------------------------------------ No results for input(s): VITAMINB12, FOLATE, FERRITIN, TIBC, IRON, RETICCTPCT in the last 72 hours.  Coagulation profile No results for input(s): INR, PROTIME in the last 168 hours.  No results for input(s): DDIMER in the last 72 hours.  Cardiac Enzymes No results for input(s): CKMB, TROPONINI, MYOGLOBIN in the last 168 hours.  Invalid input(s): CK ------------------------------------------------------------------------------------------------------------------    Component Value Date/Time   BNP 40.6 02/21/2016 0935   BNP 208.1 (H)  10/10/2015 1122    Inpatient Medications  Scheduled Meds: . amLODipine  5 mg Oral Daily  . aspirin EC  81 mg Oral Daily  . diclofenac sodium  2 g Topical QID  . enoxaparin (LOVENOX) injection  40 mg Subcutaneous Q24H  . furosemide  20 mg Oral Daily  . insulin aspart  0-20 Units Subcutaneous TID WC  . insulin aspart  20 Units Subcutaneous Once  . insulin glargine  45 Units Subcutaneous QHS  . lisinopril  10 mg Oral Daily  . pantoprazole  20 mg Oral Daily  . pravastatin  20 mg Oral Daily  . [START ON 02/23/2016] predniSONE  30 mg Oral Q breakfast   Continuous Infusions: PRN Meds:.acetaminophen **OR** acetaminophen, ondansetron **OR** ondansetron (ZOFRAN) IV, polyethylene glycol, traZODone  Micro Results No results found for this or any previous visit (from the past 240 hour(s)).  Radiology Reports Dg Chest Port 1 View  Result Date: 02/21/2016 CLINICAL DATA:  Shortness of breath, wheezing, difficulty breathing EXAM: PORTABLE CHEST 1 VIEW COMPARISON:  Portable chest x-ray of 10/12/2015 FINDINGS: No definite pneumonia is seen. There is cardiomegaly however with somewhat prominent pulmonary vascularity suggesting mild pulmonary vascular congestion. A small left pleural effusion cannot be excluded. No bony abnormality is seen. IMPRESSION: 1. No definite pneumonia. 2. Cannot exclude mild pulmonary vascular congestion with cardiomegaly. Electronically Signed   By: Ivar Drape M.D.   On: 02/21/2016 10:35     Gaddiel Cullens M.D on 02/22/2016 at 2:01 PM  Between 7am to 7pm - Pager - [phone]  After 7pm go to [email or web address] - password [email or web address]  [facility] -  Office  [phone]

## 2016-02-22 NOTE — Progress Notes (Signed)
Pt is alert and oriented with family and spanish speaking. With irregular, advenitous breathing. Walking in hall with some distress but maintained sat in low 90s on room air. Plans to go home today however Blood sugar was uncontrolled. trending down with extra insulin given, possible d/c tomorrow.

## 2016-02-22 NOTE — Progress Notes (Signed)
CRITICAL VALUE ALERT  Critical value received:  Serum glucose  Date of notification:  02/22/16  Time of notification: 1330  Critical value read back:yes  Nurse who received alert: Britiany Silbernagel  MD notified (1st page):  yes  Time of first page:  1200  MD notified (2nd page):1340  Time of second page:1340  Responding MD:  [id number]  Time MD responded:  1350

## 2016-02-23 DIAGNOSIS — J4521 Mild intermittent asthma with (acute) exacerbation: Secondary | ICD-10-CM

## 2016-02-23 LAB — PROCALCITONIN

## 2016-02-23 LAB — BASIC METABOLIC PANEL
ANION GAP: 9 (ref 5–15)
BUN: 38 mg/dL — ABNORMAL HIGH (ref 6–20)
CALCIUM: 9.4 mg/dL (ref 8.9–10.3)
CO2: 27 mmol/L (ref 22–32)
CREATININE: 1.12 mg/dL — AB (ref 0.44–1.00)
Chloride: 98 mmol/L — ABNORMAL LOW (ref 101–111)
GFR, EST AFRICAN AMERICAN: 53 mL/min — AB (ref >60)
GFR, EST NON AFRICAN AMERICAN: 45 mL/min — AB (ref >60)
Glucose, Bld: 198 mg/dL — ABNORMAL HIGH (ref 65–99)
Potassium: 3.8 mmol/L (ref 3.5–5.1)
SODIUM: 134 mmol/L — AB (ref 135–145)

## 2016-02-23 LAB — GLUCOSE, CAPILLARY
GLUCOSE-CAPILLARY: 164 mg/dL — AB (ref 65–99)
GLUCOSE-CAPILLARY: 224 mg/dL — AB (ref 65–99)

## 2016-02-23 MED ORDER — PREDNISONE 20 MG PO TABS: ORAL_TABLET | ORAL | 0 refills | Status: DC

## 2016-02-23 NOTE — Discharge Instructions (Signed)
Bronquitis aguda  (Acute Bronchitis)  Se denomina bronquitis cuando las vas respiratorias que van desde la trquea hasta los pulmones se irritan, se congestionan y duelen (se inflaman). La bronquitis generalmente produce flema espesa (mucosidad). Esto provoca tos. La tos es el sntoma ms frecuente de la bronquitis.  Cuando la bronquitis es aguda, generalmente comienza de manera sbita y desaparece luego de algn tiempo (generalmente en 2 semanas). El hbito de fumar, las alergias y el asma pueden empeorar la bronquitis. Los episodios repetidos de bronquitis pueden causar ms problemas pulmonares.  CUIDADOS EN EL HOGAR   Reposo.   Beba abundante cantidad de lquidos para mantener el pis orina) claro o amarillo plido (excepto que debe limitar la ingesta de lquidos por indicacin del mdico).   Tome slo medicamentos de venta libre o recetados, segn las indicaciones del mdico.   Evite fumar o aspirar el humo de otros fumadores. Esto puede empeorar la bronquitis. Si es fumador, considere el uso de chicles o parches en la piel de nicotina. Si deja de fumar, sus pulmones se curarn ms rpido.   Reduzca la probabilidad de enfermarse nuevamente de bronquitis de este modo:    Lvese las manos con frecuencia.    Evite las personas que tengan sntomas de resfro.    Trate de no llevarse las manos a la boca, la nariz o los ojos.   Concurra a las consultas de control con el mdico, segn las indicaciones.  SOLICITE AYUDA SI:  Los sntomas no mejoran despus de 1 semana de tratamiento. Los sntomas son:   Tos.   Fiebre.   Eliminar moco espeso al toser.   Dolores en el cuerpo.   Congestin en el pecho.   Escalofros.   Falta de aire.   Dolor de garganta.  SOLICITE AYUDA DE INMEDIATO SI:    Le sube la fiebre.   Tiene escalofros.   Comienza a sentir que le falta el aire de manera preocupante.   Tiene expectoracin con sangre (esputo).   Devuelve (vomita) con frecuencia.   Su organismo pierde mucho lquido  (deshidratacin).   Sufre un dolor intenso de cabeza.   Se desmaya.  ASEGRESE DE QUE:    Comprende estas instrucciones.   Controlar su afeccin.   Recibir ayuda de inmediato si no mejora o si empeora.     Esta informacin no tiene como fin reemplazar el consejo del mdico. Asegrese de hacerle al mdico cualquier pregunta que tenga.     Document Released: 10/13/2012  Elsevier Interactive Patient Education 2017 [employer] Inc[employer]

## 2016-02-23 NOTE — Discharge Summary (Signed)
[facility]  Physician Discharge Summary   Patient ID: Joyce Gilmore MRN: [id number] DOB/AGE: August 22, 1934 81 y.o.  Admit date: 02/21/2016 Discharge date: 02/23/2016  PCP: Milinda Antis, MD  DISCHARGE DIAGNOSES:  Principal Problem:   Asthmatic bronchitis with acute exacerbation Active Problems:   Type 2 diabetes mellitus with complication, without long-term current use of insulin (HCC)   Essential hypertension   HLD (hyperlipidemia)   Chronic diastolic heart failure (HCC)   COPD exacerbation (HCC)   RECOMMENDATIONS FOR OUTPATIENT FOLLOW UP: 1. Close outpatient follow-up with primary care physician   DISCHARGE CONDITION: fair  Diet recommendation: As before  [facility] Weights   02/21/16 2044 02/22/16 0701 02/23/16 0500  Weight: 69.5 kg (153 lb 3.2 oz) 69 kg (152 lb 1.6 oz) 69.5 kg (153 lb 4.8 oz)    INITIAL HISTORY: 81 y.o.femalewith medical history significant of diabetes mellitus type 2, hypertension, hyperlipidemia, anemia, mild diastolic CHF by echocardiogram in August 2017, pulmonary hypertension who presented to the emergency department via EMS on CPAP with complaints of severe dyspnea, Admitted for asthma exacerbation, most likely provoked by a viral bronchitis.  Consultations:  None  Procedures:  None  HOSPITAL COURSE:   Asthmatic bronchitis with acute exacerbation - Patient followed by pulmonary Dr. Isaiah Serge as an outpatient, with known diagnosis of asthma. - Presented with URI symptoms, with significant wheezing on admission, asthma exacerbation most likely provoked by viral illness. - Significantly improved with IV Solu-Medrol, will transition to by mouth prednisone. - respiratory virus panel positive for respiratory syncytial virus. Supportive treatment.  DM type II  -  most recent HgbA1C is 9.5% in August 2017 - CBG significantly uncontrolled due to use of steroids. With tapering doses of steroids. CBGs are better controlled. Continue home  medications.  Chronic Diastolic CHF - Appears euvolemic, continue with home dose Lasix  Pseudo-Hyponatremia - Secondary to hyperglycemia  Essential Hypertension - Continue home medication and adjust the doses if needed depending on the BP readings  Overall improved. Stable for discharge today.  PERTINENT LABS:  The results of significant diagnostics from this hospitalization (including imaging, microbiology, ancillary and laboratory) are listed below for reference.    Microbiology: Recent Results (from the past 240 hour(s))  Respiratory Panel by PCR     Status: Abnormal   Collection Time: 02/21/16 11:24 PM  Result Value Ref Range Status   Adenovirus NOT DETECTED NOT DETECTED Final   Coronavirus 229E NOT DETECTED NOT DETECTED Final   Coronavirus HKU1 NOT DETECTED NOT DETECTED Final   Coronavirus NL63 NOT DETECTED NOT DETECTED Final   Coronavirus OC43 NOT DETECTED NOT DETECTED Final   Metapneumovirus NOT DETECTED NOT DETECTED Final   Rhinovirus / Enterovirus NOT DETECTED NOT DETECTED Final   Influenza A NOT DETECTED NOT DETECTED Final   Influenza B NOT DETECTED NOT DETECTED Final   Parainfluenza Virus 1 NOT DETECTED NOT DETECTED Final   Parainfluenza Virus 2 NOT DETECTED NOT DETECTED Final   Parainfluenza Virus 3 NOT DETECTED NOT DETECTED Final   Parainfluenza Virus 4 NOT DETECTED NOT DETECTED Final   Respiratory Syncytial Virus DETECTED (A) NOT DETECTED Final    Comment: CRITICAL RESULT CALLED TO, READ BACK BY AND VERIFIED WITH: Yevonne Aline RN 14:50 02/22/16 ([email or web address])    Bordetella pertussis NOT DETECTED NOT DETECTED Final   Chlamydophila pneumoniae NOT DETECTED NOT DETECTED Final   Mycoplasma pneumoniae NOT DETECTED NOT DETECTED Final     Labs: Basic Metabolic Panel:  Recent Labs Lab 02/21/16 0946 02/21/16 2049 02/22/16 0242 02/22/16  1226 02/23/16 0502  NA 130*  --  129*  --  134*  K 3.7  --  4.2  --  3.8  CL 97*  --  95*  --  98*  CO2 24  --  24  --  27    GLUCOSE 190*  --  298* 520* 198*  BUN 8  --  22*  --  38*  CREATININE 0.85 1.40* 1.12*  --  1.12*  CALCIUM 9.2  --  9.5  --  9.4  MG  --  2.0  --   --   --    Liver Function Tests:  Recent Labs Lab 02/21/16 0946  AST 23  ALT 16  ALKPHOS 69  BILITOT 0.4  PROT 7.0  ALBUMIN 3.8   CBC:  Recent Labs Lab 02/21/16 0946 02/21/16 2049 02/22/16 0242  WBC 6.4 5.6 7.5  NEUTROABS 2.9  --   --   HGB 12.4 13.0 12.8  HCT 36.8 38.2 37.1  MCV 85.2 84.0 83.7  PLT 147* 182 184   BNP: BNP (last 3 results)  Recent Labs  10/10/15 1122 10/12/15 1342 02/21/16 0935  BNP 208.1* 246.9* 40.6    CBG:  Recent Labs Lab 02/22/16 1341 02/22/16 1645 02/22/16 2246 02/23/16 0658 02/23/16 1139  GLUCAP 426* 240* 222* 164* 224*     IMAGING STUDIES Dg Chest Port 1 View  Result Date: 02/21/2016 CLINICAL DATA:  Shortness of breath, wheezing, difficulty breathing EXAM: PORTABLE CHEST 1 VIEW COMPARISON:  Portable chest x-ray of 10/12/2015 FINDINGS: No definite pneumonia is seen. There is cardiomegaly however with somewhat prominent pulmonary vascularity suggesting mild pulmonary vascular congestion. A small left pleural effusion cannot be excluded. No bony abnormality is seen. IMPRESSION: 1. No definite pneumonia. 2. Cannot exclude mild pulmonary vascular congestion with cardiomegaly. Electronically Signed   By: Ivar Drape M.D.   On: 02/21/2016 10:35    DISCHARGE EXAMINATION: Vitals:   02/22/16 1244 02/22/16 1940 02/23/16 0500 02/23/16 1106  BP: 130/65 (!) 148/60 (!) 114/50 129/60  Pulse: 95 87 (!) 58 72  Resp: (!) '21 20 20 20  '$ Temp: 98.2 F (36.8 C) 98.3 F (36.8 C) 98.7 F (37.1 C) 98 F (36.7 C)  TempSrc: Oral Oral Oral Oral  SpO2: 94% 96% 91% 97%  Weight:   69.5 kg (153 lb 4.8 oz)    General appearance: alert, cooperative, appears stated age and no distress Resp: Few scattered wheezing. Good air entry bilaterally. Cardio: regular rate and rhythm, S1, S2 normal, no murmur,  click, rub or gallop GI: soft, non-tender; bowel sounds normal; no masses,  no organomegaly  DISPOSITION: Home with family  Discharge Instructions    Call MD for:  difficulty breathing, headache or visual disturbances    Complete by:  As directed    Call MD for:  extreme fatigue    Complete by:  As directed    Call MD for:  persistant dizziness or light-headedness    Complete by:  As directed    Call MD for:  persistant nausea and vomiting    Complete by:  As directed    Call MD for:  severe uncontrolled pain    Complete by:  As directed    Call MD for:  temperature >100.4    Complete by:  As directed    Discharge instructions    Complete by:  As directed    Please be sure to follow up with your PCP within a week. Take your medications as  prescribed.  You were cared for by a hospitalist during your hospital stay. If you have any questions about your discharge medications or the care you received while you were in the hospital after you are discharged, you can call the unit and asked to speak with the hospitalist on call if the hospitalist that took care of you is not available. Once you are discharged, your primary care physician will handle any further medical issues. Please note that NO REFILLS for any discharge medications will be authorized once you are discharged, as it is imperative that you return to your primary care physician (or establish a relationship with a primary care physician if you do not have one) for your aftercare needs so that they can reassess your need for medications and monitor your lab values. If you do not have a primary care physician, you can call [phone] for a physician referral.   Increase activity slowly    Complete by:  As directed       ALLERGIES:  Allergies  Allergen Reactions  . Bactrim [Sulfamethoxazole-Trimethoprim]     Hyperkalemia  . Rifampin Other (See Comments)    Severe thrombocytopenia  . Vancomycin Other (See Comments)    Severe  thrombocytopenia     Discharge Medication List as of 02/23/2016  2:01 PM    CONTINUE these medications which have CHANGED   Details  predniSONE (DELTASONE) 20 MG tablet Take 2 tablets once daily for 4 days, then take 1 tablet once daily for 4 days, then STOP., Print      CONTINUE these medications which have NOT CHANGED   Details  ACCU-CHEK AVIVA PLUS test strip USE TO CHECK BLOOD SUGAR FOUR TIMES DAILY AS DIRECTED, Normal    ACCU-CHEK SOFTCLIX LANCETS lancets USE AS DIRECTED, Normal    albuterol (PROVENTIL HFA;VENTOLIN HFA) 108 (90 Base) MCG/ACT inhaler Inhale 1-2 puffs into the lungs every 4 (four) hours as needed for wheezing or shortness of breath., Starting Tue 01/08/2016, Normal    amLODipine (NORVASC) 5 MG tablet Take 1 tablet (5 mg total) by mouth daily., Starting Wed 08/08/2015, Normal    B-D ULTRAFINE III SHORT PEN 31G X 8 MM MISC USE AS DIRECTED, Normal    benzonatate (TESSALON) 100 MG capsule TAKE 1 CAPSULE(100 MG) BY MOUTH TWICE DAILY AS NEEDED FOR COUGH, Normal    blood glucose meter kit and supplies KIT Dispense based on patient and insurance preference. Use up to four times daily as directed. (FOR ICD-9 250.00, 250.01)., Print    furosemide (LASIX) 20 MG tablet Take 1 tablet (20 mg total) by mouth daily. For fluid - spanish speaking, Starting Wed 11/07/2015, Normal    guaiFENesin-codeine 100-10 MG/5ML syrup Take 5 mLs by mouth every 6 (six) hours as needed., Starting Tue 01/08/2016, Print    insulin aspart (NOVOLOG FLEXPEN) 100 UNIT/ML FlexPen FOLLOW SLIDING SCALE AS DIRECTED, Normal    Insulin Glargine (LANTUS SOLOSTAR) 100 UNIT/ML Solostar Pen Inject 36 Units into the skin at bedtime., Starting Wed 08/08/2015, Normal    lansoprazole (PREVACID) 30 MG capsule TAKE 1 CAPSULE BY MOUTH TWICE DAILY BEFORE A MEAL, Normal    lisinopril (PRINIVIL,ZESTRIL) 10 MG tablet Take 1 tablet (10 mg total) by mouth daily., Starting Wed 08/08/2015, Normal    metoprolol tartrate  (LOPRESSOR) 25 MG tablet Take 1 tablet (25 mg total) by mouth 2 (two) times daily., Starting Wed 08/08/2015, Normal    nystatin cream (MYCOSTATIN) Apply 1 application topically 2 (two) times daily., Starting Mon 12/17/2015, Normal  pravastatin (PRAVACHOL) 20 MG tablet Take 1 tablet (20 mg total) by mouth daily., Starting Wed 08/08/2015, Normal    sucralfate (CARAFATE) 1 g tablet Take 1 tablet (1 g total) by mouth 4 (four) times daily., Starting Wed 08/08/2015, Normal      STOP taking these medications     azithromycin (ZITHROMAX) 250 MG tablet          Follow-up Information    Vic Blackbird, MD. Schedule an appointment as soon as possible for a visit in 1 week(s).   Specialty:  Family Medicine Why:  for hospital stay follow up Contact information: [place] [address] [phone]           TOTAL DISCHARGE TIME: 35 minutes  [facility] Pager [phone]  02/23/2016, 5:32 PM

## 2016-02-23 NOTE — Progress Notes (Signed)
Patient is discharge to home accompanied by patient's daughter and NT via wheelchair. Prescriptions and discharge instructions given . Patient and daughter  verbalizes understanding. All personal belongings given. Telemetry box and IV removed prior to discharge and site in good condition.

## 2016-02-28 ENCOUNTER — Telehealth: Payer: Self-pay | Admitting: Family Medicine

## 2016-02-28 NOTE — Telephone Encounter (Signed)
faby patients grandmother calling about medication questions that could be affecting her grandmother  [phone] (M)

## 2016-02-28 NOTE — Telephone Encounter (Signed)
Returned call to patient granddaughter.   States that patient is having fluctuating FSBS, but does not have records of readings. States that patient is voicing C/O increased weakness and fatigue.   Appointment scheduled.

## 2016-03-03 ENCOUNTER — Ambulatory Visit (INDEPENDENT_AMBULATORY_CARE_PROVIDER_SITE_OTHER): Payer: Medicaid Other | Admitting: Physician Assistant

## 2016-03-03 ENCOUNTER — Encounter: Payer: Self-pay | Admitting: Physician Assistant

## 2016-03-03 VITALS — BP 128/70 | HR 81 | Temp 98.3°F | Resp 16 | Wt 160.0 lb

## 2016-03-03 DIAGNOSIS — E118 Type 2 diabetes mellitus with unspecified complications: Secondary | ICD-10-CM

## 2016-03-03 DIAGNOSIS — Z09 Encounter for follow-up examination after completed treatment for conditions other than malignant neoplasm: Secondary | ICD-10-CM

## 2016-03-03 DIAGNOSIS — J45901 Unspecified asthma with (acute) exacerbation: Secondary | ICD-10-CM | POA: Diagnosis not present

## 2016-03-03 NOTE — Progress Notes (Signed)
Patient ID: ADESSA PRIMIANO MRN: [id number], DOB: Aug 01, 1934, 82 y.o. Date of Encounter: 03/03/2016, 4:09 PM    Chief Complaint:  Chief Complaint  Patient presents with  . Blood Sugar Problem    high/low readings     HPI: 82 y.o. year old female presetns with above.   Today at the visit with her is her daughter and also Stage manager.  I reviewed her chart.  She had office visit with me 02/20/16 at which time I prescribed azithromycin Z-Pak and prednisone 20 mg daily 5 days.  She subsequently was hospitalized 02/21/16. I reviewed that note. Respiratory panel was performed and positive for RSV. She was felt to also have asthmatic bronchitis with acute exacerbation provoked by viral bronchitis. She was significantly improved with IV Solu-Medrol and was transitioned to p.o. prednisone. Chest x-ray was performed and showed no acute abnormality.  Today patient states that she did take the oral prednisone as directed. Today patient reports that her breathing is back to her normal baseline.  She states that she has been getting higher blood sugar readings recently. I reassured her that prednisone will cause blood sugar to go on up and as the prednisone wears out of her system the blood sugar should drift back down towards her normal readings. She states that she does already have a routine office visit scheduled with Dr. Buelah Manis for regular office visit in the near future.     Home Meds:   Outpatient Medications Prior to Visit  Medication Sig Dispense Refill  . ACCU-CHEK AVIVA PLUS test strip USE TO CHECK BLOOD SUGAR FOUR TIMES DAILY AS DIRECTED 100 each 0  . ACCU-CHEK SOFTCLIX LANCETS lancets USE AS DIRECTED 200 each 0  . albuterol (PROVENTIL HFA;VENTOLIN HFA) 108 (90 Base) MCG/ACT inhaler Inhale 1-2 puffs into the lungs every 4 (four) hours as needed for wheezing or shortness of breath. 18 Inhaler 1  . amLODipine (NORVASC) 5 MG tablet Take 1 tablet (5 mg  total) by mouth daily. 90 tablet 3  . B-D ULTRAFINE III SHORT PEN 31G X 8 MM MISC USE AS DIRECTED 100 each 11  . blood glucose meter kit and supplies KIT Dispense based on patient and insurance preference. Use up to four times daily as directed. (FOR ICD-9 250.00, 250.01). 1 each 0  . furosemide (LASIX) 20 MG tablet Take 1 tablet (20 mg total) by mouth daily. For fluid - spanish speaking 30 tablet 3  . guaiFENesin-codeine 100-10 MG/5ML syrup Take 5 mLs by mouth every 6 (six) hours as needed. 120 mL 0  . insulin aspart (NOVOLOG FLEXPEN) 100 UNIT/ML FlexPen FOLLOW SLIDING SCALE AS DIRECTED (Patient taking differently: Inject 3 Units into the skin 3 (three) times daily with meals. ) 15 mL 3  . Insulin Glargine (LANTUS SOLOSTAR) 100 UNIT/ML Solostar Pen Inject 36 Units into the skin at bedtime. 15 pen 3  . lansoprazole (PREVACID) 30 MG capsule TAKE 1 CAPSULE BY MOUTH TWICE DAILY BEFORE A MEAL (Patient taking differently: Take 30 mg by mouth 2 (two) times daily before a meal. ) 180 capsule 3  . lisinopril (PRINIVIL,ZESTRIL) 10 MG tablet Take 1 tablet (10 mg total) by mouth daily. 90 tablet 3  . metoprolol tartrate (LOPRESSOR) 25 MG tablet Take 1 tablet (25 mg total) by mouth 2 (two) times daily. 180 tablet 3  . nystatin cream (MYCOSTATIN) Apply 1 application topically 2 (two) times daily. 30 g 0  . pravastatin (PRAVACHOL) 20 MG tablet Take 1 tablet (20 mg  total) by mouth daily. 90 tablet 3  . sucralfate (CARAFATE) 1 g tablet Take 1 tablet (1 g total) by mouth 4 (four) times daily. 360 tablet 3  . benzonatate (TESSALON) 100 MG capsule TAKE 1 CAPSULE(100 MG) BY MOUTH TWICE DAILY AS NEEDED FOR COUGH (Patient not taking: Reported on 03/03/2016) 20 capsule 0  . predniSONE (DELTASONE) 20 MG tablet Take 2 tablets once daily for 4 days, then take 1 tablet once daily for 4 days, then STOP. (Patient not taking: Reported on 03/03/2016) 12 tablet 0   No facility-administered medications prior to visit.     Allergies:    Allergies  Allergen Reactions  . Bactrim [Sulfamethoxazole-Trimethoprim]     Hyperkalemia  . Rifampin Other (See Comments)    Severe thrombocytopenia  . Vancomycin Other (See Comments)    Severe thrombocytopenia      Review of Systems: See HPI for pertinent ROS. All other ROS negative.    Physical Exam: Blood pressure 128/70, pulse 81, temperature 98.3 F (36.8 C), temperature source Oral, resp. rate 16, weight 160 lb (72.6 kg), SpO2 98 %., Body mass index is 35.87 kg/m. General:  [place] Female. Appears in no acute distress. Neck: Supple. No thyromegaly. No lymphadenopathy. Lungs: Breaths sounds are clear. No wheezing.  SaO2 98% on RA. Heart: Regular rhythm. No murmurs, rubs, or gallops. Msk:  Strength and tone normal for age. Extremities/Skin: Warm and dry.  Neuro: Alert and oriented X 3. Moves all extremities spontaneously. Gait is normal. CNII-XII grossly in tact. Psych:  Responds to questions appropriately with a normal affect.     ASSESSMENT AND PLAN:  82 y.o. year old female with  1. Hospital discharge follow-up  2. Asthmatic bronchitis with acute exacerbation, unspecified asthma severity, unspecified whether persistent --This is resolved.   3. Type 2 diabetes mellitus with complication, without long-term current use of insulin ([place]) Reassured her that as the prednisone wears out of her system her blood sugars should return to prior levels.  She does already have an appointment scheduled with Dr. Buelah Manis for routine office visit for diabetes management. Follow up with that appointment. Follow-up sooner if needed.   Marin Olp [place], [place], [facility] 03/03/2016 4:09 PM

## 2016-03-06 ENCOUNTER — Inpatient Hospital Stay: Payer: Medicaid Other | Admitting: Physician Assistant

## 2016-03-07 ENCOUNTER — Ambulatory Visit: Payer: Medicaid Other | Admitting: Family Medicine

## 2016-03-11 ENCOUNTER — Ambulatory Visit (INDEPENDENT_AMBULATORY_CARE_PROVIDER_SITE_OTHER): Payer: Medicaid Other | Admitting: Family Medicine

## 2016-03-11 ENCOUNTER — Encounter: Payer: Self-pay | Admitting: Family Medicine

## 2016-03-11 VITALS — BP 126/74 | HR 74 | Temp 98.4°F | Resp 18 | Ht <= 58 in | Wt 160.0 lb

## 2016-03-11 DIAGNOSIS — E871 Hypo-osmolality and hyponatremia: Secondary | ICD-10-CM

## 2016-03-11 DIAGNOSIS — E118 Type 2 diabetes mellitus with unspecified complications: Secondary | ICD-10-CM | POA: Diagnosis not present

## 2016-03-11 DIAGNOSIS — J4540 Moderate persistent asthma, uncomplicated: Secondary | ICD-10-CM

## 2016-03-11 DIAGNOSIS — J45909 Unspecified asthma, uncomplicated: Secondary | ICD-10-CM | POA: Insufficient documentation

## 2016-03-11 LAB — CBC WITH DIFFERENTIAL/PLATELET
BASOS PCT: 0 %
Basophils Absolute: 0 cells/uL (ref 0–200)
Eosinophils Absolute: 174 cells/uL (ref 15–500)
Eosinophils Relative: 3 %
HEMATOCRIT: 38.7 % (ref 35.0–45.0)
Hemoglobin: 12.5 g/dL (ref 12.0–15.0)
LYMPHS PCT: 26 %
Lymphs Abs: 1508 cells/uL (ref 850–3900)
MCH: 28.5 pg (ref 27.0–33.0)
MCHC: 32.3 g/dL (ref 32.0–36.0)
MCV: 88.4 fL (ref 80.0–100.0)
MONO ABS: 406 {cells}/uL (ref 200–950)
MPV: 9.6 fL (ref 7.5–12.5)
Monocytes Relative: 7 %
NEUTROS ABS: 3712 {cells}/uL (ref 1500–7800)
Neutrophils Relative %: 64 %
PLATELETS: 181 10*3/uL (ref 140–400)
RBC: 4.38 MIL/uL (ref 3.80–5.10)
RDW: 13.8 % (ref 11.0–15.0)
WBC: 5.8 10*3/uL (ref 3.8–10.8)

## 2016-03-11 LAB — COMPREHENSIVE METABOLIC PANEL
ALK PHOS: 83 U/L (ref 33–130)
ALT: 20 U/L (ref 6–29)
AST: 18 U/L (ref 10–35)
Albumin: 3.7 g/dL (ref 3.6–5.1)
BILIRUBIN TOTAL: 0.3 mg/dL (ref 0.2–1.2)
BUN: 10 mg/dL (ref 7–25)
CALCIUM: 9 mg/dL (ref 8.6–10.4)
CO2: 26 mmol/L (ref 20–31)
Chloride: 96 mmol/L — ABNORMAL LOW (ref 98–110)
Creat: 1.06 mg/dL — ABNORMAL HIGH (ref 0.60–0.93)
GLUCOSE: 302 mg/dL — AB (ref 70–99)
Potassium: 4.4 mmol/L (ref 3.5–5.3)
Sodium: 132 mmol/L — ABNORMAL LOW (ref 135–146)
Total Protein: 6.5 g/dL (ref 6.1–8.1)

## 2016-03-11 MED ORDER — METOPROLOL TARTRATE 25 MG PO TABS: 25.0000 mg | ORAL_TABLET | Freq: Two times a day (BID) | ORAL | 3 refills | Status: DC

## 2016-03-11 MED ORDER — PRAVASTATIN SODIUM 20 MG PO TABS: 20.0000 mg | ORAL_TABLET | Freq: Every day | ORAL | 3 refills | Status: DC

## 2016-03-11 MED ORDER — LANSOPRAZOLE 30 MG PO CPDR
DELAYED_RELEASE_CAPSULE | ORAL | 3 refills | Status: DC
Start: 2016-03-11 — End: 2016-05-08

## 2016-03-11 MED ORDER — AMLODIPINE BESYLATE 5 MG PO TABS: 5.0000 mg | ORAL_TABLET | Freq: Every day | ORAL | 3 refills | Status: DC

## 2016-03-11 MED ORDER — LISINOPRIL 10 MG PO TABS: 10.0000 mg | ORAL_TABLET | Freq: Every day | ORAL | 3 refills | Status: DC

## 2016-03-11 MED ORDER — ALBUTEROL SULFATE (2.5 MG/3ML) 0.083% IN NEBU: 2.5000 mg | INHALATION_SOLUTION | Freq: Four times a day (QID) | RESPIRATORY_TRACT | 1 refills | Status: DC | PRN

## 2016-03-11 MED ORDER — SUCRALFATE 1 G PO TABS: 1.0000 g | ORAL_TABLET | Freq: Four times a day (QID) | ORAL | 3 refills | Status: DC

## 2016-03-11 MED ORDER — FUROSEMIDE 20 MG PO TABS: 20.0000 mg | ORAL_TABLET | Freq: Every day | ORAL | 3 refills | Status: DC

## 2016-03-11 NOTE — Assessment & Plan Note (Signed)
I think her blood sugars elevated in the setting of recent illness in the medications. I'm in increase her Lantus to 38 units and NovoLog will be 5 units with each meal We will follow-up by phone with her daughter in 2 weeks to see how her blood sugars are reading

## 2016-03-11 NOTE — Assessment & Plan Note (Signed)
Flare for asthma with bronchitis secondary to the RSV virus causing acute respiratory failure. I believe that she will benefit from having nebulizer at home as she decompensates quite quickly when she does get flares. As will be arranged today. Her oxygenation is normal

## 2016-03-11 NOTE — Patient Instructions (Signed)
Increase lantus to 38units  At bedtime  Increase novolog to 5 units with each meal  Medications have been refilled  She had RSV - this is a virus that caused her asthma to flare up, making it difficult for her to breath Use the albuterol nebulizer three times a day for next 3 days  Get the nebulizer from medical supply  I am checking her sodium levels We will call in 2 weeks to get her blood sugar readings  F/U 3 months

## 2016-03-11 NOTE — Assessment & Plan Note (Signed)
Recheck sodium level to the setting of her recent illness and her fatigue

## 2016-03-11 NOTE — Progress Notes (Signed)
Subjective:    Patient ID: Joyce Gilmore, female    DOB: 15-Jul-1934, 82 y.o.   MRN: [id number]  Patient presents for Hospital F/U (has been seen by PA on 1/8- reports that she is feeling better) and Confusion (patient reports that she has increased confusion and buzzing in head) Patient's son as well as translator present during visit Patient for hospital follow-up she is admitted for asthma exacerbation provoked by viral bronchitis. She was on CPAP when she presented to the emergency room. She was treated with IV Solu-Medrol and then transitioned to prednisone by mouth she did have a positive RSV panel.She does continue to have cough with some production. She is also using her albuterol which helps.  Diabetes mellitus her A1c had improved to 7.5% she is taking Lantus 36 units at night and she is using a sliding scale but cannot tell me how much insulin she is been injecting with the sliding scale. She states her blood sugars have still been high her seven-day average despite being off of the prednisone for the past week is still to 97 her highest blood sugar was 402 no hypoglycemia symptoms   She is history of recurrent chronic hyponatremia her last sodium level was 134 , 2 weeks ago with a chloride of 98 creatinine was 1.12. Say she has been feeling a little confused a little buzzing in her head per report. She denies any headache  It is noted that her great granddaughter who is 65 months old was hospitalized at the same time with RSV Review Of Systems:  GEN- + fatigue, denies fever, weight loss,weakness, recent illness HEENT- denies eye drainage, change in vision, nasal discharge, CVS- denies chest pain, palpitations RESP- denies SOB, +cough,+ wheeze ABD- denies N/V, change in stools, abd pain GU- denies dysuria, hematuria, dribbling, incontinence MSK- denies joint pain, muscle aches, injury Neuro- +headache, dizziness, syncope, seizure activity       Objective:    BP 126/74 (BP  Location: Right Arm, Patient Position: Sitting, Cuff Size: Large)   Pulse 74   Temp 98.4 F (36.9 C) (Oral)   Resp 18   Ht 4\' 8"  (1.422 m)   Wt 160 lb (72.6 kg)   SpO2 98%   BMI 35.87 kg/m  GEN- NAD, alert and oriented x3 HEENT- PERRL, EOMI, non injected sclera, pink conjunctiva, MMM, oropharynx clear, nares clear rhinorrhea, TM clear no effusion Neck- Supple, no thyromegaly, no LAD,no JVD  CVS- RRR, no murmur RESP-few expiratory wheeze, +coughing fits with ratteling, clears with expectorant, normal WOB  ABD-NABS,soft,NT,ND EXT- No edema Pulses- Radial 2+        Assessment & Plan:      Problem List Items Addressed This Visit    Type 2 diabetes mellitus with complication, without long-term current use of insulin (HCC) - Primary    I think her blood sugars elevated in the setting of recent illness in the medications. I'm in increase her Lantus to 38 units and NovoLog will be 5 units with each meal We will follow-up by phone with her daughter in 2 weeks to see how her blood sugars are reading      Relevant Medications   pravastatin (PRAVACHOL) 20 MG tablet   lisinopril (PRINIVIL,ZESTRIL) 10 MG tablet   Other Relevant Orders   CBC with Differential/Platelet   Hyponatremia    Recheck sodium level to the setting of her recent illness and her fatigue      Relevant Orders   CBC with Differential/Platelet  Comprehensive metabolic panel   Asthmatic bronchitis    Flare for asthma with bronchitis secondary to the RSV virus causing acute respiratory failure. I believe that she will benefit from having nebulizer at home as she decompensates quite quickly when she does get flares. As will be arranged today. Her oxygenation is normal      Relevant Medications   albuterol (PROVENTIL) (2.5 MG/3ML) 0.083% nebulizer solution      Note: This dictation was prepared with Dragon dictation along with smaller phrase technology. Any transcriptional errors that result from this process are  unintentional.

## 2016-03-14 ENCOUNTER — Other Ambulatory Visit: Payer: Self-pay | Admitting: Family Medicine

## 2016-03-14 NOTE — Telephone Encounter (Signed)
Rx refilled per protocol 

## 2016-03-17 DIAGNOSIS — J441 Chronic obstructive pulmonary disease with (acute) exacerbation: Secondary | ICD-10-CM | POA: Diagnosis not present

## 2016-05-06 ENCOUNTER — Other Ambulatory Visit: Payer: Self-pay | Admitting: Family Medicine

## 2016-05-08 ENCOUNTER — Other Ambulatory Visit: Payer: Self-pay | Admitting: *Deleted

## 2016-05-08 MED ORDER — LANSOPRAZOLE 30 MG PO CPDR: DELAYED_RELEASE_CAPSULE | ORAL | 3 refills | Status: DC

## 2016-05-19 ENCOUNTER — Telehealth: Payer: Self-pay | Admitting: *Deleted

## 2016-05-19 MED ORDER — LANSOPRAZOLE 30 MG PO CPDR: DELAYED_RELEASE_CAPSULE | ORAL | 3 refills | Status: DC

## 2016-05-19 NOTE — Telephone Encounter (Signed)
Received request from pharmacy for PA on   PA submitted.   Dx: GERD- K21.9.  Received PA determination.   PA approved x1 year.  Confirmation #: [id number].

## 2016-05-20 ENCOUNTER — Other Ambulatory Visit: Payer: Self-pay | Admitting: Family Medicine

## 2016-05-20 DIAGNOSIS — Z1231 Encounter for screening mammogram for malignant neoplasm of breast: Secondary | ICD-10-CM

## 2016-05-30 ENCOUNTER — Ambulatory Visit
Admission: RE | Admit: 2016-05-30 | Discharge: 2016-05-30 | Disposition: A | Discharge status: Home / Self Care | Payer: Medicaid Other | Source: Ambulatory Visit | Attending: Family Medicine | Admitting: Family Medicine

## 2016-05-30 DIAGNOSIS — Z1231 Encounter for screening mammogram for malignant neoplasm of breast: Secondary | ICD-10-CM | POA: Diagnosis not present

## 2016-06-10 ENCOUNTER — Encounter: Payer: Self-pay | Admitting: Family Medicine

## 2016-06-10 ENCOUNTER — Ambulatory Visit (INDEPENDENT_AMBULATORY_CARE_PROVIDER_SITE_OTHER): Payer: Medicaid Other | Admitting: Family Medicine

## 2016-06-10 VITALS — BP 128/72 | HR 82 | Temp 98.8°F | Resp 16 | Ht <= 58 in | Wt 158.0 lb

## 2016-06-10 DIAGNOSIS — E7849 Other hyperlipidemia: Secondary | ICD-10-CM

## 2016-06-10 DIAGNOSIS — E118 Type 2 diabetes mellitus with unspecified complications: Secondary | ICD-10-CM

## 2016-06-10 DIAGNOSIS — E784 Other hyperlipidemia: Secondary | ICD-10-CM | POA: Diagnosis not present

## 2016-06-10 DIAGNOSIS — E871 Hypo-osmolality and hyponatremia: Secondary | ICD-10-CM | POA: Diagnosis not present

## 2016-06-10 DIAGNOSIS — I5032 Chronic diastolic (congestive) heart failure: Secondary | ICD-10-CM

## 2016-06-10 LAB — COMPREHENSIVE METABOLIC PANEL
ALK PHOS: 94 U/L (ref 33–130)
ALT: 13 U/L (ref 6–29)
AST: 20 U/L (ref 10–35)
Albumin: 4.2 g/dL (ref 3.6–5.1)
BILIRUBIN TOTAL: 0.3 mg/dL (ref 0.2–1.2)
BUN: 14 mg/dL (ref 7–25)
CALCIUM: 9.8 mg/dL (ref 8.6–10.4)
CO2: 21 mmol/L (ref 20–31)
Chloride: 96 mmol/L — ABNORMAL LOW (ref 98–110)
Creat: 1.02 mg/dL — ABNORMAL HIGH (ref 0.60–0.93)
GLUCOSE: 126 mg/dL — AB (ref 70–99)
Potassium: 4.6 mmol/L (ref 3.5–5.3)
Sodium: 130 mmol/L — ABNORMAL LOW (ref 135–146)
Total Protein: 7.6 g/dL (ref 6.1–8.1)

## 2016-06-10 LAB — LIPID PANEL
CHOLESTEROL: 137 mg/dL (ref <200)
HDL: 47 mg/dL — ABNORMAL LOW (ref >50)
LDL Cholesterol: 49 mg/dL (ref <100)
Total CHOL/HDL Ratio: 2.9 Ratio (ref <5.0)
Triglycerides: 203 mg/dL — ABNORMAL HIGH (ref <150)
VLDL: 41 mg/dL — ABNORMAL HIGH (ref <30)

## 2016-06-10 LAB — CBC WITH DIFFERENTIAL/PLATELET
BASOS PCT: 1 %
Basophils Absolute: 70 cells/uL (ref 0–200)
Eosinophils Absolute: 140 cells/uL (ref 15–500)
Eosinophils Relative: 2 %
HEMATOCRIT: 39.3 % (ref 35.0–45.0)
Hemoglobin: 13.1 g/dL (ref 12.0–15.0)
LYMPHS PCT: 29 %
Lymphs Abs: 2030 cells/uL (ref 850–3900)
MCH: 28.2 pg (ref 27.0–33.0)
MCHC: 33.3 g/dL (ref 32.0–36.0)
MCV: 84.5 fL (ref 80.0–100.0)
MONO ABS: 560 {cells}/uL (ref 200–950)
MPV: 10.4 fL (ref 7.5–12.5)
Monocytes Relative: 8 %
NEUTROS ABS: 4200 {cells}/uL (ref 1500–7800)
Neutrophils Relative %: 60 %
PLATELETS: 242 10*3/uL (ref 140–400)
RBC: 4.65 MIL/uL (ref 3.80–5.10)
RDW: 13.9 % (ref 11.0–15.0)
WBC: 7 10*3/uL (ref 3.8–10.8)

## 2016-06-10 MED ORDER — INSULIN ASPART 100 UNIT/ML FLEXPEN: PEN_INJECTOR | SUBCUTANEOUS | 3 refills | Status: DC

## 2016-06-10 MED ORDER — BENZONATATE 100 MG PO CAPS: ORAL_CAPSULE | ORAL | 0 refills | Status: DC

## 2016-06-10 MED ORDER — SUCRALFATE 1 G PO TABS: 1.0000 g | ORAL_TABLET | Freq: Four times a day (QID) | ORAL | 3 refills | Status: DC

## 2016-06-10 MED ORDER — METOPROLOL TARTRATE 25 MG PO TABS: 25.0000 mg | ORAL_TABLET | Freq: Two times a day (BID) | ORAL | 3 refills | Status: DC

## 2016-06-10 MED ORDER — LANSOPRAZOLE 30 MG PO CPDR: DELAYED_RELEASE_CAPSULE | ORAL | 3 refills | Status: DC

## 2016-06-10 MED ORDER — FUROSEMIDE 20 MG PO TABS: 20.0000 mg | ORAL_TABLET | Freq: Every day | ORAL | 3 refills | Status: DC

## 2016-06-10 MED ORDER — PRAVASTATIN SODIUM 20 MG PO TABS: 20.0000 mg | ORAL_TABLET | Freq: Every day | ORAL | 3 refills | Status: DC

## 2016-06-10 MED ORDER — INSULIN GLARGINE 100 UNIT/ML SOLOSTAR PEN: 38.0000 [IU] | PEN_INJECTOR | Freq: Every day | SUBCUTANEOUS | 2 refills | Status: DC

## 2016-06-10 MED ORDER — INSULIN PEN NEEDLE 31G X 8 MM MISC: 11 refills | Status: DC

## 2016-06-10 MED ORDER — ALBUTEROL SULFATE HFA 108 (90 BASE) MCG/ACT IN AERS: 1.0000 | INHALATION_SPRAY | RESPIRATORY_TRACT | 1 refills | Status: DC | PRN

## 2016-06-10 MED ORDER — LISINOPRIL 10 MG PO TABS: 10.0000 mg | ORAL_TABLET | Freq: Every day | ORAL | 3 refills | Status: DC

## 2016-06-10 NOTE — Assessment & Plan Note (Signed)
Diabetes has been fairly well controlled I am very concerned when she does go to [place] and she often has medication mixup and gets ill and does not get proper care. The telling that she is in a small pill shares no hospital close by. This time her children are going to go up to see her every couple weeks to make sure that she has all her medications. There'll be no change her insulin unless her A1c has changed. She is to continue the Lantus 38 units of NovoLog 5 units with each meal. Continue her cholesterol medication as well.  Darrick Meigs will be given for diabetic shoes.

## 2016-06-10 NOTE — Patient Instructions (Signed)
F/U  3 months 

## 2016-06-10 NOTE — Assessment & Plan Note (Signed)
Currently compensated no changes  

## 2016-06-10 NOTE — Progress Notes (Addendum)
Subjective:    Patient ID: Joyce Gilmore, female    DOB: [birth year], 82 y.o.   MRN: [id number]  Patient presents for 3 month F/U (is not fasting) and Pre- Trip Guidance (is leaving for [place] and needs refills on all meds for 90 days)  Pt here to f/u chronic medical problems. She is leaving for [place] again, we have discussed her travel multiple times before she gets severely ill every times she returns.  Pt son and interpreter present    DM- last A1C 7.5% in Dec, on Lantus 38 units, Novolog 5 units with each meal , states CBG around 160 , No hypoglycemia symptoms   Chronic hyponatremia- some self induced with increased water consumption, due for recheck   HTN- taking BP meds as prescribed   She also states that she has some soreness in her gums from her dentures. She says that she had been worked on not too long ago but still has soreness. This change the way she has been eating.  Review Of Systems:  GEN- denies fatigue, fever, weight loss,weakness, recent illness HEENT- denies eye drainage, change in vision, nasal discharge, CVS- denies chest pain, palpitations RESP- denies SOB, cough, wheeze ABD- denies N/V, change in stools, abd pain GU- denies dysuria, hematuria, dribbling, incontinence MSK- denies joint pain, muscle aches, injury Neuro- denies headache, dizziness, syncope, seizure activity       Objective:    BP 128/72   Pulse 82   Temp 98.8 F (37.1 C) (Oral)   Resp 16   Ht 4\' 8"  (1.422 m)   Wt 158 lb (71.7 kg)   SpO2 98%   BMI 35.42 kg/m  GEN- NAD, alert and oriented x3 HEENT- PERRL, EOMI, non injected sclera, pink conjunctiva, MMM, oropharynx clear, no ulcerations seen  Neck- Supple, no LAD  CVS- RRR, no murmur RESP-CTAB ABD-NABS,soft,NT,ND EXT- No edema Pulses- Radial, DP- 2+  FOOT EXAM Addendum- previous exam stated callous should read- Pre ulcerative callus, thick nails   Normal monofilament       Assessment & Plan:      No ulcerations seen in  mouth, she is going to get them adjusted in [place] Note we called pharmacy to check her meds and refill everthing  Problem List Items Addressed This Visit    Type 2 diabetes mellitus with complication, without long-term current use of insulin ([place]) - Primary    Diabetes has been fairly well controlled I am very concerned when she does go to [place] and she often has medication mixup and gets ill and does not get proper care. The telling that she is in a small pill shares no hospital close by. This time her children are going to go up to see her every couple weeks to make sure that she has all her medications. There'll be no change her insulin unless her A1c has changed. She is to continue the Lantus 38 units of NovoLog 5 units with each meal. Continue her cholesterol medication as well.  Darrick Meigs will be given for diabetic shoes.      Relevant Medications   pravastatin (PRAVACHOL) 20 MG tablet   lisinopril (PRINIVIL,ZESTRIL) 10 MG tablet   Insulin Glargine (LANTUS SOLOSTAR) 100 UNIT/ML Solostar Pen   insulin aspart (NOVOLOG FLEXPEN) 100 UNIT/ML FlexPen   Other Relevant Orders   CBC with Differential/Platelet   Comprehensive metabolic panel   Hemoglobin A1c   Hyponatremia    Check sodium levels      HLD (hyperlipidemia)   Relevant  Medications   pravastatin (PRAVACHOL) 20 MG tablet   metoprolol tartrate (LOPRESSOR) 25 MG tablet   lisinopril (PRINIVIL,ZESTRIL) 10 MG tablet   furosemide (LASIX) 20 MG tablet   Other Relevant Orders   Lipid panel   Chronic diastolic heart failure (HCC)    Currently compensated no changes      Relevant Medications   pravastatin (PRAVACHOL) 20 MG tablet   metoprolol tartrate (LOPRESSOR) 25 MG tablet   lisinopril (PRINIVIL,ZESTRIL) 10 MG tablet   furosemide (LASIX) 20 MG tablet      Note: This dictation was prepared with Dragon dictation along with smaller phrase technology. Any transcriptional errors that result from this process are unintentional.

## 2016-06-10 NOTE — Assessment & Plan Note (Signed)
Check sodium levels. 

## 2016-06-11 ENCOUNTER — Telehealth: Payer: Self-pay | Admitting: *Deleted

## 2016-06-11 LAB — HEMOGLOBIN A1C
HEMOGLOBIN A1C: 7.4 % — AB (ref <5.7)
MEAN PLASMA GLUCOSE: 166 mg/dL

## 2016-06-11 NOTE — Telephone Encounter (Signed)
Okay to send updated note

## 2016-06-11 NOTE — Telephone Encounter (Signed)
Prescription sent to pharmacy.

## 2016-06-11 NOTE — Telephone Encounter (Signed)
Received call from [place] with [facility].   States that she requires addended notes stating that patient has hx of pre-ulcerative callus before her insurance will pay.   MD please advise.

## 2016-06-13 ENCOUNTER — Encounter: Payer: Self-pay | Admitting: Pulmonary Disease

## 2016-06-13 ENCOUNTER — Ambulatory Visit (INDEPENDENT_AMBULATORY_CARE_PROVIDER_SITE_OTHER): Payer: Medicaid Other | Admitting: Pulmonary Disease

## 2016-06-13 VITALS — BP 118/72 | HR 61 | Ht <= 58 in | Wt 156.0 lb

## 2016-06-13 DIAGNOSIS — R0602 Shortness of breath: Secondary | ICD-10-CM

## 2016-06-13 NOTE — Progress Notes (Signed)
       Joyce Gilmore    [id number]    07/18/1934  Primary Care Physician:Banning, KAWANTA, MD  Referring Physician: Kawanta F Beech[place], MD [address] [place], [place] [address]  Chief complaint: Follow up for evaluation of dyspnea. ? Pulm HTN  HPI: Mrs. Gilmore is an 82-year-old with a recent diagnosis of asthma. She is an immigrant from [place] and had a trip to [place] in December 2016. She admits to dietary indiscretion and noncompliance with medications during that. She apparently had decompensation of diastolic heart failure and diabetes. She continued to do poorly after her [place] the [place]. She was hospitalized in August for a combination of CHF, asthma exacerbation. She does not speak English and is here with her son who is helping to translate.  A previous echo in 16 showed slight elevation in PA pressure. She has a diagnosis of pulmonary hypertension. However repeat echo in 2017 shows normal PA pressures. Diastolic dysfunction is redemonstrated with normal EF.  Outpatient Encounter Prescriptions as of 06/13/2016  Medication Sig  . ACCU-CHEK AVIVA PLUS test strip USE TO CHECK BLOOD SUGAR FOUR TIMES DAILY AS DIRECTED  . ACCU-CHEK SOFTCLIX LANCETS lancets USE AS DIRECTED  . albuterol (PROVENTIL HFA;VENTOLIN HFA) 108 (90 Base) MCG/ACT inhaler Inhale 1-2 puffs into the lungs every 4 (four) hours as needed for wheezing or shortness of breath.  . albuterol (PROVENTIL) (2.5 MG/3ML) 0.083% nebulizer solution Take 3 mLs (2.5 mg total) by nebulization every 6 (six) hours as needed for wheezing or shortness of breath.  . amLODipine (NORVASC) 5 MG tablet Take 1 tablet (5 mg total) by mouth daily.  . benzonatate (TESSALON) 100 MG capsule TAKE 1 CAPSULE(100 MG) BY MOUTH TWICE DAILY AS NEEDED FOR COUGH  . blood glucose meter kit and supplies KIT Dispense based on patient and insurance preference. Use up to four times daily as directed. (FOR ICD-9 250.00, 250.01).  . furosemide (LASIX)  20 MG tablet Take 1 tablet (20 mg total) by mouth daily. For fluid - spanish speaking  . insulin aspart (NOVOLOG FLEXPEN) 100 UNIT/ML FlexPen FOLLOW SLIDING SCALE AS DIRECTED  . Insulin Glargine (LANTUS SOLOSTAR) 100 UNIT/ML Solostar Pen Inject 38 Units into the skin daily at 10 pm.  . Insulin Pen Needle (B-D ULTRAFINE III SHORT PEN) 31G X 8 MM MISC USE AS DIRECTED  . lansoprazole (PREVACID) 30 MG capsule TAKE 1 CAPSULE BY MOUTH TWICE DAILY BEFORE A MEAL  . lisinopril (PRINIVIL,ZESTRIL) 10 MG tablet Take 1 tablet (10 mg total) by mouth daily.  . metoprolol tartrate (LOPRESSOR) 25 MG tablet Take 1 tablet (25 mg total) by mouth 2 (two) times daily.  . nystatin cream (MYCOSTATIN) Apply 1 application topically 2 (two) times daily.  . pravastatin (PRAVACHOL) 20 MG tablet Take 1 tablet (20 mg total) by mouth daily.  . sucralfate (CARAFATE) 1 g tablet Take 1 tablet (1 g total) by mouth 4 (four) times daily.   No facility-administered encounter medications on file as of 06/13/2016.     Allergies as of 06/13/2016 - Review Complete 06/13/2016  Allergen Reaction Noted  . Bactrim [sulfamethoxazole-trimethoprim]  04/08/2012  . Rifampin Other (See Comments) 01/06/2012  . Vancomycin Other (See Comments) 01/06/2012    Past Medical History:  Diagnosis Date  . Arthritis   . CHF (congestive heart failure) (HCC)    diastolic  . Chronic kidney disease    hx of kidney stones,   . Depression    hx of   .   Diabetes mellitus without complication (HCC)   . Ganglion, left ankle and foot   . Hearing loss of both ears   . Hypertension   . MRSA infection    Oct 13 - Nov 13  . Osteoporosis   . Tinnitus of both ears     Past Surgical History:  Procedure Laterality Date  . BACK SURGERY     history restored after error with record merge      . INCISION AND DRAINAGE HIP  12/22/2011   Procedure: IRRIGATION AND DEBRIDEMENT HIP;  Surgeon: Christopher Y Blackman, MD;  Location: [facility];  Service: Orthopedics;   Laterality: Right;  Irrigation and debridement right hip  . INCISION AND DRAINAGE HIP  12/27/2011   Procedure: IRRIGATION AND DEBRIDEMENT HIP;  Surgeon: Christopher Y Blackman, MD;  Location: [facility];  Service: Orthopedics;  Laterality: Right;  Repeat irrigation and debridement  . JOINT REPLACEMENT     bilateral knee, right hip   . TOTAL HIP REVISION  12/05/2011   Procedure: TOTAL HIP REVISION;  Surgeon: Christopher Y Blackman, MD;  Location: [facility];  Service: Orthopedics;  Laterality: Right;  Right Hip Revision Arthroplasty to Total Hip, Excision of Old Implant    Family History  Problem Relation Age of Onset  . Hypertension Mother   . Colon cancer Neg Hx   . Breast cancer Neg Hx     Social History   Social History  . Marital status: Widowed    Spouse name: N/A  . Number of children: 7  . Years of education: N/A   Occupational History  . Not on file.   Social History Main Topics  . Smoking status: Never Smoker  . Smokeless tobacco: Never Used  . Alcohol use No  . Drug use: No  . Sexual activity: No   Other Topics Concern  . Not on file   Social History Narrative   ** Merged History Encounter **       Review of systems: Review of Systems  Constitutional: Negative for fever and chills.  HENT: Negative.   Eyes: Negative for blurred vision.  Respiratory: as per HPI  Cardiovascular: Negative for chest pain and palpitations.  Gastrointestinal: Negative for vomiting, diarrhea, blood per rectum. Genitourinary: Negative for dysuria, urgency, frequency and hematuria.  Musculoskeletal: Negative for myalgias, back pain and joint pain.  Skin: Negative for itching and rash.  Neurological: Negative for dizziness, tremors, focal weakness, seizures and loss of consciousness.  Endo/Heme/Allergies: Negative for environmental allergies.  Psychiatric/Behavioral: Negative for depression, suicidal ideas and hallucinations.  All other systems reviewed and are negative.  Physical  Exam: Blood pressure 118/72, pulse 61, height 4' 8" (1.422 m), weight 156 lb (70.8 kg), SpO2 97 %. Gen:      No acute distress HEENT:  EOMI, sclera anicteric Neck:     No masses; no thyromegaly Lungs:    Clear to auscultation bilaterally; normal respiratory effort CV:         Regular rate and rhythm; no murmurs Abd:      + bowel sounds; soft, non-tender; no palpable masses, no distension Ext:    No edema; adequate peripheral perfusion Skin:      Warm and dry; no rash Neuro: alert and oriented x 3 Psych: normal mood and affect  Data Reviewed: Echo 10/14/15 Left ventricle: The cavity size was normal. Wall thickness was   normal. Systolic function was normal. The estimated ejection   fraction was in the range of 60% to 65%. Wall motion   was normal;   there were no regional wall motion abnormalities. Doppler   parameters are consistent with abnormal left ventricular   relaxation (grade 1 diastolic dysfunction). Pulmonary artery:   The main pulmonary artery was normal-sized. Systolic pressure was within the normal range.  Echo 09/25/14 Left ventricle: Systolic function was normal. The estimated   ejection fraction was in the range of 60% to 65%. Doppler   parameters are consistent with abnormal left ventricular   relaxation (grade 1 diastolic dysfunction). - Left atrium: The atrium was mildly dilated. - Pulmonary arteries: PA peak pressure: 44 mm Hg (S).  Chest x-ray 10/12/15 Cardiomegaly, bibasilar opacities. Images reviewed.  CT chest 01/03/12 No PE, bibasilar atelectasis. Images reviewed.  HIV 09/26/14- Non reactive  LFTs 10/12/15- WNL   Spirometry 01/24/16 FVC 1.76 (99%] FEV1 1.52 (117%) F/F 86 Normal spirometry. No broncho dilator response. Unable to do diffusion and lung volumes  Assessment:  #1 Evaluation for dyspnea. Dyspnea appears multifactorial from a combination of HFPEF, deconditioning, excessive weight. She does have a diagnosis of asthma but her symptoms are not  very typical. PFTs do not show any obstruction. She'll also continue her CHF medications including Lasix. I have asked her to work on weight loss, diet, exercise. She has albuterol rescue inhaler but uses it very rarely  #2 Pulmonary HTN Her last echo in 2017 does not show elevation in PA pressure and she has normal RV function. There is a suggestion of cirrhosis on her CT of the abdomen but LFTs are normal and HIV test is negative. There is no evidence of connective tissue disease. I don't believe this needs further workup.   Plan/Recommendations: - Continue albuterol PRN  Return as needed  Praveen Mannam MD [facility] Pager [phone] 06/13/2016, 3:06 PM  CC: Hooven, Kawanta F, MD   

## 2016-07-17 ENCOUNTER — Other Ambulatory Visit: Payer: Self-pay | Admitting: Family Medicine

## 2016-07-21 ENCOUNTER — Other Ambulatory Visit: Payer: Self-pay | Admitting: Family Medicine

## 2016-09-09 ENCOUNTER — Ambulatory Visit: Payer: Medicaid Other | Admitting: Family Medicine

## 2016-09-12 ENCOUNTER — Encounter: Payer: Self-pay | Admitting: Family Medicine

## 2016-09-12 ENCOUNTER — Ambulatory Visit (INDEPENDENT_AMBULATORY_CARE_PROVIDER_SITE_OTHER): Payer: Medicaid Other | Admitting: Family Medicine

## 2016-09-12 VITALS — BP 120/74 | HR 60 | Temp 98.6°F | Resp 16 | Ht <= 58 in | Wt 155.0 lb

## 2016-09-12 DIAGNOSIS — I272 Pulmonary hypertension, unspecified: Secondary | ICD-10-CM

## 2016-09-12 DIAGNOSIS — I10 Essential (primary) hypertension: Secondary | ICD-10-CM

## 2016-09-12 DIAGNOSIS — E222 Syndrome of inappropriate secretion of antidiuretic hormone: Secondary | ICD-10-CM | POA: Diagnosis not present

## 2016-09-12 DIAGNOSIS — Z974 Presence of external hearing-aid: Secondary | ICD-10-CM

## 2016-09-12 DIAGNOSIS — E118 Type 2 diabetes mellitus with unspecified complications: Secondary | ICD-10-CM | POA: Diagnosis not present

## 2016-09-12 MED ORDER — ALBUTEROL SULFATE HFA 108 (90 BASE) MCG/ACT IN AERS: 1.0000 | INHALATION_SPRAY | RESPIRATORY_TRACT | 3 refills | Status: DC | PRN

## 2016-09-12 MED ORDER — FUROSEMIDE 20 MG PO TABS: 20.0000 mg | ORAL_TABLET | Freq: Every day | ORAL | 3 refills | Status: DC

## 2016-09-12 NOTE — Assessment & Plan Note (Signed)
Controlled no changes 

## 2016-09-12 NOTE — Assessment & Plan Note (Signed)
A1C has improved with regimen Meter fixed in office Goal keep A1C < 8%

## 2016-09-12 NOTE — Patient Instructions (Addendum)
Adjustment for hearing aids Send inhaler and water pill sent in  F/U 3 months

## 2016-09-12 NOTE — Assessment & Plan Note (Signed)
Na has been stable, pt monitors water intake Recheck today

## 2016-09-12 NOTE — Progress Notes (Signed)
   Subjective:    Patient ID: Joyce Gilmore, female    DOB: 1934/07/15, 82 y.o.   MRN: [id number]  Patient presents for 3 month F/U (is not fasting)  Pt daughter  and interpreter present  Meds reviewed  Recently returned from [place] last wed, States that she did well and neck to cut she was not sick she had all of her medications. She is out of her water pill she ran out a couple of days ago.   DM- last A1C 7.4% in April , on Lantus 38 units but she has been taking 38 , Novolog 5 units with each meal , states CBG 190,  Having trouble with meter   No hypoglycemia symptoms  Daughter also states that she's having trouble with her hearing aids she does not seem to be at a here as well. They've not had them adjusted  Due for repeat Na level and A1C      Review Of Systems:  GEN- denies fatigue, fever, weight loss,weakness, recent illness HEENT- denies eye drainage, change in vision, nasal discharge, CVS- denies chest pain, palpitations RESP- denies SOB, cough, wheeze ABD- denies N/V, change in stools, abd pain GU- denies dysuria, hematuria, dribbling, incontinence MSK- denies joint pain, muscle aches, injury Neuro- denies headache, dizziness, syncope, seizure activity       Objective:    BP 120/74   Pulse 60   Temp 98.6 F (37 C) (Oral)   Resp 16   Ht 4\' 8"  (1.422 m)   Wt 155 lb (70.3 kg)   SpO2 96%   BMI 34.75 kg/m  GEN- NAD, alert and oriented x3 HEENT- PERRL, EOMI, non injected sclera, pink conjunctiva, MMM, oropharynx clear, TM clear, canals clear Neck- Supple, no thyromegaly CVS- RRR, no murmur RESP-CTAB ABD-NABS,soft,NT,ND EXT- No edema Pulses- Radial, DP- 2+        Assessment & Plan:      Problem List Items Addressed This Visit    Uses hearing aid    Will contact her previous ENT and see where she was referred to for aides So they can be adjusted No wax noted on exam      Type 2 diabetes mellitus with complication, without long-term current use  of insulin (HCC)    A1C has improved with regimen Meter fixed in office Goal keep A1C < 8%       Relevant Orders   CBC with Differential/Platelet   Comprehensive metabolic panel   Hemoglobin A1c   SIADH (syndrome of inappropriate ADH production) (HCC)    Na has been stable, pt monitors water intake Recheck today      Pulmonary hypertension (HCC)    Doing well from breathing standpoint Restart lasix      Relevant Medications   furosemide (LASIX) 20 MG tablet   Essential hypertension - Primary    Controlled no changes       Relevant Medications   furosemide (LASIX) 20 MG tablet      Note: This dictation was prepared with Dragon dictation along with smaller phrase technology. Any transcriptional errors that result from this process are unintentional.

## 2016-09-12 NOTE — Assessment & Plan Note (Signed)
Doing well from breathing standpoint Restart lasix

## 2016-09-12 NOTE — Assessment & Plan Note (Signed)
Will contact her previous ENT and see where she was referred to for aides So they can be adjusted No wax noted on exam

## 2016-09-13 LAB — COMPREHENSIVE METABOLIC PANEL
ALBUMIN: 3.8 g/dL (ref 3.6–5.1)
ALK PHOS: 111 U/L (ref 33–130)
ALT: 15 U/L (ref 6–29)
AST: 18 U/L (ref 10–35)
BUN: 18 mg/dL (ref 7–25)
CALCIUM: 8.8 mg/dL (ref 8.6–10.4)
CHLORIDE: 101 mmol/L (ref 98–110)
CO2: 22 mmol/L (ref 20–31)
Creat: 1.07 mg/dL — ABNORMAL HIGH (ref 0.60–0.93)
Glucose, Bld: 123 mg/dL — ABNORMAL HIGH (ref 70–99)
POTASSIUM: 4.8 mmol/L (ref 3.5–5.3)
Sodium: 132 mmol/L — ABNORMAL LOW (ref 135–146)
TOTAL PROTEIN: 6.4 g/dL (ref 6.1–8.1)
Total Bilirubin: 0.2 mg/dL (ref 0.2–1.2)

## 2016-09-13 LAB — CBC WITH DIFFERENTIAL/PLATELET
Basophils Absolute: 82 cells/uL (ref 0–200)
Basophils Relative: 1 %
EOS ABS: 246 {cells}/uL (ref 15–500)
Eosinophils Relative: 3 %
HEMATOCRIT: 38.1 % (ref 35.0–45.0)
Hemoglobin: 12.3 g/dL (ref 12.0–15.0)
Lymphocytes Relative: 30 %
Lymphs Abs: 2460 cells/uL (ref 850–3900)
MCH: 28 pg (ref 27.0–33.0)
MCHC: 32.3 g/dL (ref 32.0–36.0)
MCV: 86.8 fL (ref 80.0–100.0)
MONOS PCT: 6 %
MPV: 9.6 fL (ref 7.5–12.5)
Monocytes Absolute: 492 cells/uL (ref 200–950)
NEUTROS ABS: 4920 {cells}/uL (ref 1500–7800)
Neutrophils Relative %: 60 %
PLATELETS: 215 10*3/uL (ref 140–400)
RBC: 4.39 MIL/uL (ref 3.80–5.10)
RDW: 14.1 % (ref 11.0–15.0)
WBC: 8.2 10*3/uL (ref 3.8–10.8)

## 2016-09-13 LAB — HEMOGLOBIN A1C
HEMOGLOBIN A1C: 7.2 % — AB (ref <5.7)
MEAN PLASMA GLUCOSE: 160 mg/dL

## 2016-09-16 ENCOUNTER — Other Ambulatory Visit: Payer: Self-pay | Admitting: *Deleted

## 2016-09-16 DIAGNOSIS — Z461 Encounter for fitting and adjustment of hearing aid: Secondary | ICD-10-CM

## 2016-10-01 ENCOUNTER — Telehealth: Payer: Self-pay | Admitting: Family Medicine

## 2016-10-01 MED ORDER — BENZONATATE 100 MG PO CAPS: ORAL_CAPSULE | ORAL | 0 refills | Status: DC

## 2016-10-01 NOTE — Telephone Encounter (Signed)
Pt wants refill on tessalon pearls.

## 2016-10-01 NOTE — Telephone Encounter (Signed)
Prescription sent to pharmacy.

## 2016-10-01 NOTE — Telephone Encounter (Signed)
Okay to refill? 

## 2016-10-01 NOTE — Telephone Encounter (Signed)
Ok to refill 

## 2016-10-29 ENCOUNTER — Other Ambulatory Visit: Payer: Self-pay | Admitting: Family Medicine

## 2016-11-27 ENCOUNTER — Ambulatory Visit (INDEPENDENT_AMBULATORY_CARE_PROVIDER_SITE_OTHER): Payer: Medicaid Other

## 2016-11-27 ENCOUNTER — Ambulatory Visit (INDEPENDENT_AMBULATORY_CARE_PROVIDER_SITE_OTHER): Payer: Medicaid Other | Admitting: Orthopaedic Surgery

## 2016-11-27 ENCOUNTER — Encounter (INDEPENDENT_AMBULATORY_CARE_PROVIDER_SITE_OTHER): Payer: Self-pay | Admitting: Orthopaedic Surgery

## 2016-11-27 DIAGNOSIS — M545 Low back pain: Secondary | ICD-10-CM

## 2016-11-27 MED ORDER — TRAMADOL HCL 50 MG PO TABS: 100.0000 mg | ORAL_TABLET | Freq: Three times a day (TID) | ORAL | 0 refills | Status: DC | PRN

## 2016-11-27 NOTE — Addendum Note (Signed)
Addended by: Precious Bard on: 11/27/2016 05:18 PM   Modules accepted: Orders

## 2016-11-27 NOTE — Progress Notes (Signed)
Office Visit Note   Patient: Joyce Gilmore           Date of Birth: Feb 10, 1935           MRN: [id number] Visit Date: 11/27/2016              Requested by: Alycia Rossetti, MD [address] [place], [place] [address] PCP: Alycia Rossetti, MD   Assessment & Plan: Visit Diagnoses:  1. Acute left-sided low back pain, with sciatica presence unspecified     Plan: Through an interpreter we talked about trying physical therapy to see if this would decrease her back pain as well as decrease her left-sided sciatic symptoms. We have the therapist work on a modalities to help her feel better. She's been having some shoulder pain as well and certainly embark in the shoulders could help. Any modalities again would be appropriate. We'll also try some tramadol for pain. I'll see her back in 4 weeks to see how she doing overall.  Follow-Up Instructions: Return in about 4 weeks (around 12/25/2016).   Orders:  Orders Placed This Encounter  Procedures  . XR Pelvis 1-2 Views  . XR Lumbar Spine 2-3 Views   Meds ordered this encounter  Medications  . traMADol (ULTRAM) 50 MG tablet    Sig: Take 2 tablets (100 mg total) by mouth 3 (three) times daily as needed.    Dispense:  60 tablet    Refill:  0      Procedures: No procedures performed   Clinical Data: No additional findings.   Subjective: Chief Complaint  Patient presents with  . Lower Back - Pain  . Left Leg - Pain  Ms. Joyce Gilmore is well-known to me. She's been a long-term patient of mine. She comes in today with left-sided low back pain that radiates all the way down her left hip to behind her left knee and into the foot. She is diabetic but does not have significant neuropathy. This been going on for short period time and Tylenol does ease the pain some but is becoming aggravating to her. She denies any groin pain on that side. She denies any weakness. She tries to stay as active as possible.  HPI  Review of Systems She  currently denies any change in bowel or bladder function. She denies any headache, chest pain, shortness of breath, fever, chills, nausea, vomiting  Objective: Vital Signs: There were no vitals taken for this visit.  Physical Exam She is alert and oriented 3 and in no acute distress Ortho Exam Examination of her left lower extremity shows no pain in the groin with good range of motion left hip. Her left total knee replacement does feel stable area she's had good strength in her left lower extremity as well. She does not have pain of the trochanteric area her left hip. She definitely has pain in the paraspinal muscles to the left side in the sciatic region to the left side. Specialty Comments:  No specialty comments available.  Imaging: Xr Lumbar Spine 2-3 Views  Result Date: 11/27/2016 2 views of the lumbar spine show significant degenerative scoliosis and degenerative disc disease at multiple levels of lumbar spine.  Xr Pelvis 1-2 Views  Result Date: 11/27/2016 AP pelvis shows a well-seated total hip arthroplasty on the right side and a normal-appearing native hip on the left side.    PMFS History: Patient Active Problem List   Diagnosis Date Noted  . Uses hearing aid 09/12/2016  .  Asthmatic bronchitis 03/11/2016  . COPD exacerbation ([place]) 02/21/2016  . Chronic diastolic heart failure ([place]) 10/12/2015  . Shortness of breath 10/12/2015  . Seasonal allergies 11/21/2014  . DDD (degenerative disc disease), lumbar 10/11/2014  . H/O compression fracture of spine 10/11/2014  . Pulmonary hypertension ([place])   . Hepatic cirrhosis ([place]) 09/24/2014  . SIADH (syndrome of inappropriate ADH production) ([place]) 06/13/2014  . Insomnia 12/28/2013  . Anemia 07/06/2013  . HLD (hyperlipidemia) 06/27/2013  . Osteoporosis 07/26/2012  . Hyponatremia 05/16/2011  . Type 2 diabetes mellitus with complication, without long-term current use of insulin ([facility]) 08/17/2006  . Essential hypertension  08/17/2006  . DIVERTICULOSIS, COLON 08/17/2006   Past Medical History:  Diagnosis Date  . Arthritis   . CHF (congestive heart failure) (HCC)    diastolic  . Chronic kidney disease    hx of kidney stones,   . Depression    hx of   . Diabetes mellitus without complication ([place])   . Ganglion, left ankle and foot   . Hearing loss of both ears   . Hypertension   . MRSA infection    Oct 13 - Nov 13  . Osteoporosis   . Tinnitus of both ears     Family History  Problem Relation Age of Onset  . Hypertension Mother   . Colon cancer Neg Hx   . Breast cancer Neg Hx     Past Surgical History:  Procedure Laterality Date  . BACK SURGERY     history restored after error with record merge      . INCISION AND DRAINAGE HIP  12/22/2011   Procedure: IRRIGATION AND DEBRIDEMENT HIP;  Surgeon: Mcarthur Rossetti, MD;  Location: [facility];  Service: Orthopedics;  Laterality: Right;  Irrigation and debridement right hip  . INCISION AND DRAINAGE HIP  12/27/2011   Procedure: IRRIGATION AND DEBRIDEMENT HIP;  Surgeon: Mcarthur Rossetti, MD;  Location: [facility];  Service: Orthopedics;  Laterality: Right;  Repeat irrigation and debridement  . JOINT REPLACEMENT     bilateral knee, right hip   . TOTAL HIP REVISION  12/05/2011   Procedure: TOTAL HIP REVISION;  Surgeon: Mcarthur Rossetti, MD;  Location: [facility];  Service: Orthopedics;  Laterality: Right;  Right Hip Revision Arthroplasty to Total Hip, Excision of Old Implant   Social History   Occupational History  . Not on file.   Social History Main Topics  . Smoking status: Never Smoker  . Smokeless tobacco: Never Used  . Alcohol use No  . Drug use: No  . Sexual activity: No

## 2016-12-08 ENCOUNTER — Encounter: Payer: Self-pay | Admitting: Family Medicine

## 2016-12-08 ENCOUNTER — Ambulatory Visit (INDEPENDENT_AMBULATORY_CARE_PROVIDER_SITE_OTHER): Payer: Medicaid Other | Admitting: Family Medicine

## 2016-12-08 VITALS — BP 138/60 | HR 84 | Temp 99.1°F | Resp 16 | Wt 158.0 lb

## 2016-12-08 DIAGNOSIS — J4540 Moderate persistent asthma, uncomplicated: Secondary | ICD-10-CM

## 2016-12-08 DIAGNOSIS — J209 Acute bronchitis, unspecified: Secondary | ICD-10-CM

## 2016-12-08 MED ORDER — INSULIN GLARGINE 100 UNIT/ML SOLOSTAR PEN: 38.0000 [IU] | PEN_INJECTOR | Freq: Every day | SUBCUTANEOUS | 2 refills | Status: DC

## 2016-12-08 MED ORDER — FLUTICASONE FUROATE 100 MCG/ACT IN AEPB: 1.0000 | INHALATION_SPRAY | Freq: Every day | RESPIRATORY_TRACT | 0 refills | Status: DC

## 2016-12-08 MED ORDER — PREDNISONE 20 MG PO TABS: 20.0000 mg | ORAL_TABLET | Freq: Every day | ORAL | 0 refills | Status: DC

## 2016-12-08 MED ORDER — AMLODIPINE BESYLATE 5 MG PO TABS: 5.0000 mg | ORAL_TABLET | Freq: Every day | ORAL | 3 refills | Status: DC

## 2016-12-08 MED ORDER — LISINOPRIL 10 MG PO TABS: 10.0000 mg | ORAL_TABLET | Freq: Every day | ORAL | 3 refills | Status: DC

## 2016-12-08 MED ORDER — GLUCOSE BLOOD VI STRP: ORAL_STRIP | 5 refills | Status: DC

## 2016-12-08 MED ORDER — AZITHROMYCIN 250 MG PO TABS: ORAL_TABLET | ORAL | 0 refills | Status: DC

## 2016-12-08 MED ORDER — INSULIN PEN NEEDLE 31G X 8 MM MISC: 11 refills | Status: DC

## 2016-12-08 MED ORDER — INSULIN ASPART 100 UNIT/ML FLEXPEN: PEN_INJECTOR | SUBCUTANEOUS | 3 refills | Status: DC

## 2016-12-08 MED ORDER — BENZONATATE 100 MG PO CAPS: ORAL_CAPSULE | ORAL | 1 refills | Status: DC

## 2016-12-08 MED ORDER — METOPROLOL TARTRATE 25 MG PO TABS: 25.0000 mg | ORAL_TABLET | Freq: Two times a day (BID) | ORAL | 3 refills | Status: DC

## 2016-12-08 NOTE — Progress Notes (Signed)
   Subjective:    Patient ID: Joyce Gilmore, female    DOB: Nov 20, 1934, 82 y.o.   MRN: [id number]  Patient presents for Chest congestion, cough x 2 months  Cough for past 2 months , has gotton worse past couple of weeks. Cough now productive with wheezing,   Using nebulizer twice a day  No CP, no swelling in feet   No fever  Using over the counter cough medicine, which is not helping  CBG 122-150, no hypoglycemia, has appt for labs and DM for end of the week   Review Of Systems:  GEN- + fatigue, denies  fever, weight loss,weakness, recent illness HEENT- denies eye drainage, change in vision, nasal discharge, CVS- denies chest pain, palpitations RESP- denies SOB, +cough, +wheeze ABD- denies N/V, change in stools, abd pain GU- denies dysuria, hematuria, dribbling, incontinence MSK- denies joint pain, muscle aches, injury Neuro- denies headache, dizziness, syncope, seizure activity       Objective:    BP 138/60   Pulse 84   Temp 99.1 F (37.3 C) (Oral)   Resp 16   Wt 158 lb (71.7 kg)   SpO2 97%   BMI 35.42 kg/m  GEN- NAD, alert and oriented x3 HEENT- PERRL, EOMI, non injected sclera, pink conjunctiva, MMM, oropharynx clear, nares clear rhinorrhea  Neck- Supple, no LAD  CVS- RRR, no murmur RESP-few scatterd wheeze otherwise clear, normal WOB EXT- No edema Pulses- Radial 2+        Assessment & Plan:      Problem List Items Addressed This Visit    None    Visit Diagnoses    Acute bronchitis, unspecified organism    -  Primary   Based on history,and previous complications, will treat for bacterial bronchitis, though she does have some chronic cough in the background likley associated with her asthma history,, zpak, prednisone, tessalon. She has  seen by pulmonary, start Arnuity 1 puff daily as mainatance, starting on Friday and see how she does.     Asthma/bronchitis    Note: This dictation was prepared with Dragon dictation along with smaller phrase technology.  Any transcriptional errors that result from this process are unintentional.

## 2016-12-08 NOTE — Patient Instructions (Signed)
Take antibiotics Take prednisone Tessalon perrles for cough Use the new inhaler  CHANGE F/U TO NEXT Monday/OR Tuesday FOR DIABETES

## 2016-12-12 ENCOUNTER — Ambulatory Visit: Payer: Medicaid Other | Admitting: Family Medicine

## 2016-12-15 ENCOUNTER — Encounter: Payer: Self-pay | Admitting: Family Medicine

## 2016-12-15 ENCOUNTER — Ambulatory Visit (INDEPENDENT_AMBULATORY_CARE_PROVIDER_SITE_OTHER): Payer: Medicaid Other | Admitting: Family Medicine

## 2016-12-15 VITALS — BP 126/74 | HR 60 | Temp 98.1°F | Resp 18 | Ht <= 58 in | Wt 160.0 lb

## 2016-12-15 DIAGNOSIS — J4540 Moderate persistent asthma, uncomplicated: Secondary | ICD-10-CM | POA: Diagnosis not present

## 2016-12-15 DIAGNOSIS — I10 Essential (primary) hypertension: Secondary | ICD-10-CM | POA: Diagnosis not present

## 2016-12-15 DIAGNOSIS — Z23 Encounter for immunization: Secondary | ICD-10-CM

## 2016-12-15 DIAGNOSIS — N182 Chronic kidney disease, stage 2 (mild): Secondary | ICD-10-CM | POA: Diagnosis not present

## 2016-12-15 DIAGNOSIS — E118 Type 2 diabetes mellitus with unspecified complications: Secondary | ICD-10-CM

## 2016-12-15 MED ORDER — ACCU-CHEK SOFTCLIX LANCETS MISC: 11 refills | Status: DC

## 2016-12-15 MED ORDER — FLUTICASONE FUROATE 100 MCG/ACT IN AEPB: 1.0000 | INHALATION_SPRAY | Freq: Every day | RESPIRATORY_TRACT | 3 refills | Status: DC

## 2016-12-15 MED ORDER — LISINOPRIL 10 MG PO TABS: 10.0000 mg | ORAL_TABLET | Freq: Every day | ORAL | 3 refills | Status: DC

## 2016-12-15 MED ORDER — FUROSEMIDE 20 MG PO TABS: 20.0000 mg | ORAL_TABLET | Freq: Every day | ORAL | 3 refills | Status: DC

## 2016-12-15 MED ORDER — AMLODIPINE BESYLATE 5 MG PO TABS: 5.0000 mg | ORAL_TABLET | Freq: Every day | ORAL | 3 refills | Status: DC

## 2016-12-15 MED ORDER — PRAVASTATIN SODIUM 20 MG PO TABS: 20.0000 mg | ORAL_TABLET | Freq: Every day | ORAL | 3 refills | Status: DC

## 2016-12-15 NOTE — Assessment & Plan Note (Signed)
Well controlled Fasting labs and lipids Medications refilled

## 2016-12-15 NOTE — Progress Notes (Signed)
   Subjective:    Patient ID: Joyce Gilmore, female    DOB: 1934-04-05, 82 y.o.   MRN: [id number]  Patient presents for Follow-up (is not fasting)  Patient here to follow-up chronic medical problems.  He was seen about 2 weeks ago secondary to bronchitis.  She has history of chronic cough she has been seen by pulmonary.  She was started on Arnuity 1 puff daily, to see how this affected cough. Her cough is much improved with the arnuity   DM- CBG range  120-130's- no hypoglycemia- reviewed meter, 30 day average  28 , taking lantus 38 units at bedtime 6 units novolog with meals, last A1C 7.2%,  LDL at goal in April   HTN- taking BP meds as prescribed  Hepatic cirrhosis nonalcoholic noted from her GI workup in the past however she does not have any symptoms from this and there is no active treatment.  Liver function has been normal.  Review Of Systems:  GEN- denies fatigue, fever, weight loss,weakness, recent illness HEENT- denies eye drainage, change in vision, nasal discharge, CVS- denies chest pain, palpitations RESP- denies SOB, =cough, wheeze ABD- denies N/V, change in stools, abd pain GU- denies dysuria, hematuria, dribbling, incontinence MSK- denies joint pain, muscle aches, injury Neuro- denies headache, dizziness, syncope, seizure activity       Objective:    BP 126/74   Pulse 60   Temp 98.1 F (36.7 C) (Oral)   Resp 18   Ht 4\' 8"  (1.422 m)   Wt 160 lb (72.6 kg)   SpO2 97%   BMI 35.87 kg/m  GEN- NAD, alert and oriented x3 HEENT- PERRL, EOMI, non injected sclera, pink conjunctiva, MMM, oropharynx clear Neck- Supple, no  LAD , no JVD CVS- RRR, no murmur RESP-CTAB ABD-NABS,soft,NT,ND EXT- No edema Pulses- Radial, DP- 2+        Assessment & Plan:     She will leave for [place] for 3 months  Problem List Items Addressed This Visit      Unprioritized   Type 2 diabetes mellitus with diabetic chronic kidney disease ([facility]) - Primary    Goal is A1C < 8%   Continue lantus 38 units Novolog 6 units       Relevant Medications   lisinopril (PRINIVIL,ZESTRIL) 10 MG tablet   pravastatin (PRAVACHOL) 20 MG tablet   Essential hypertension    Well controlled Fasting labs and lipids Medications refilled       Relevant Medications   lisinopril (PRINIVIL,ZESTRIL) 10 MG tablet   amLODipine (NORVASC) 5 MG tablet   pravastatin (PRAVACHOL) 20 MG tablet   furosemide (LASIX) 20 MG tablet   Other Relevant Orders   LDL cholesterol, direct   Asthmatic bronchitis    COntinue Arnuity, symptoms much improved Flu shot given       Relevant Medications   Fluticasone Furoate (ARNUITY ELLIPTA) 100 MCG/ACT AEPB    Other Visit Diagnoses    Encounter for immunization       Relevant Orders   Flu vaccine HIGH DOSE PF (Completed)   CKD (chronic kidney disease), stage II          Note: This dictation was prepared with Dragon dictation along with smaller phrase technology. Any transcriptional errors that result from this process are unintentional.

## 2016-12-15 NOTE — Patient Instructions (Addendum)
F/U 4 months  Flu shot given We will call with lab results  

## 2016-12-15 NOTE — Assessment & Plan Note (Signed)
COntinue Arnuity, symptoms much improved Flu shot given

## 2016-12-15 NOTE — Assessment & Plan Note (Signed)
Goal is A1C < 8%  Continue lantus 38 units Novolog 6 units

## 2016-12-16 LAB — COMPREHENSIVE METABOLIC PANEL
AG RATIO: 1.6 (calc) (ref 1.0–2.5)
ALBUMIN MSPROF: 4.1 g/dL (ref 3.6–5.1)
ALT: 13 U/L (ref 6–29)
AST: 15 U/L (ref 10–35)
Alkaline phosphatase (APISO): 99 U/L (ref 33–130)
BILIRUBIN TOTAL: 0.3 mg/dL (ref 0.2–1.2)
BUN/Creatinine Ratio: 23 (calc) — ABNORMAL HIGH (ref 6–22)
BUN: 28 mg/dL — ABNORMAL HIGH (ref 7–25)
CALCIUM: 9.2 mg/dL (ref 8.6–10.4)
CO2: 28 mmol/L (ref 20–32)
Chloride: 96 mmol/L — ABNORMAL LOW (ref 98–110)
Creat: 1.21 mg/dL — ABNORMAL HIGH (ref 0.60–0.88)
Globulin: 2.6 g/dL (calc) (ref 1.9–3.7)
Glucose, Bld: 163 mg/dL — ABNORMAL HIGH (ref 65–99)
Potassium: 5.2 mmol/L (ref 3.5–5.3)
SODIUM: 132 mmol/L — AB (ref 135–146)
TOTAL PROTEIN: 6.7 g/dL (ref 6.1–8.1)

## 2016-12-16 LAB — CBC WITH DIFFERENTIAL/PLATELET
Basophils Absolute: 42 cells/uL (ref 0–200)
Basophils Relative: 0.4 %
EOS PCT: 1.4 %
Eosinophils Absolute: 148 cells/uL (ref 15–500)
HEMATOCRIT: 38.9 % (ref 35.0–45.0)
HEMOGLOBIN: 12.9 g/dL (ref 11.7–15.5)
LYMPHS ABS: 2735 {cells}/uL (ref 850–3900)
MCH: 28.4 pg (ref 27.0–33.0)
MCHC: 33.2 g/dL (ref 32.0–36.0)
MCV: 85.5 fL (ref 80.0–100.0)
MPV: 11.1 fL (ref 7.5–12.5)
Monocytes Relative: 7.6 %
Neutro Abs: 6869 cells/uL (ref 1500–7800)
Neutrophils Relative %: 64.8 %
Platelets: 225 10*3/uL (ref 140–400)
RBC: 4.55 10*6/uL (ref 3.80–5.10)
RDW: 13.5 % (ref 11.0–15.0)
Total Lymphocyte: 25.8 %
WBC mixed population: 806 cells/uL (ref 200–950)
WBC: 10.6 10*3/uL (ref 3.8–10.8)

## 2016-12-16 LAB — HEMOGLOBIN A1C
Hgb A1c MFr Bld: 7.4 % of total Hgb — ABNORMAL HIGH (ref <5.7)
Mean Plasma Glucose: 166 (calc)
eAG (mmol/L): 9.2 (calc)

## 2016-12-16 LAB — LDL CHOLESTEROL, DIRECT: Direct LDL: 60 mg/dL (ref <100)

## 2016-12-17 ENCOUNTER — Encounter: Payer: Self-pay | Admitting: *Deleted

## 2016-12-24 ENCOUNTER — Ambulatory Visit (INDEPENDENT_AMBULATORY_CARE_PROVIDER_SITE_OTHER): Payer: Medicaid Other | Admitting: Orthopaedic Surgery

## 2017-01-12 ENCOUNTER — Other Ambulatory Visit: Payer: Self-pay | Admitting: *Deleted

## 2017-01-12 MED ORDER — FLUTICASONE FUROATE 100 MCG/ACT IN AEPB: 1.0000 | INHALATION_SPRAY | Freq: Every day | RESPIRATORY_TRACT | 3 refills | Status: DC

## 2017-01-14 ENCOUNTER — Telehealth: Payer: Self-pay | Admitting: Family Medicine

## 2017-01-14 ENCOUNTER — Telehealth: Payer: Self-pay | Admitting: *Deleted

## 2017-01-14 NOTE — Telephone Encounter (Signed)
Received request from pharmacy for [place] on [address],   [place] submitted. Confirmation #: [id number]  Dx: COPD- J45.40

## 2017-01-14 NOTE — Telephone Encounter (Signed)
Call placed to patient niece to inquire.   States that she was only given 3 pens for Lantus when she is usually given 5.   Call placed to pharmacy. Advised that 3 pens will cover 20 day supply and she can fill at that time.   Call placed to patient and patient made aware. Also advised that PA has been submitted for inhaler.

## 2017-01-14 NOTE — Telephone Encounter (Signed)
Patient is calling to say that her inhaler is not covered by insurance, also has a question about her insulin  [phone]

## 2017-01-19 MED ORDER — FLUTICASONE FUROATE 100 MCG/ACT IN AEPB: 1.0000 | INHALATION_SPRAY | Freq: Every day | RESPIRATORY_TRACT | 3 refills | Status: DC

## 2017-01-19 NOTE — Telephone Encounter (Signed)
Received PA determination.   PA [id number] Approved 01/14/2017 - 01/14/2018.  Pharmacy made aware.

## 2017-02-25 ENCOUNTER — Telehealth: Payer: Self-pay | Admitting: *Deleted

## 2017-02-25 NOTE — Telephone Encounter (Signed)
Received call from patient granddaughter Lizette.  Reports that patient is currently in [place]. States that she spoke with patient recently and was made aware that patient has been having episodes of weakness and blood loss from mouth. Was unclear about type of bleeding (amount, color, possible sources).   States that patient sees MD while in [place], but he is currently on vacation.   Advised that patient needs to be evaluated ASAP. Advised to tell patient to go to closest hospital.

## 2017-02-25 NOTE — Telephone Encounter (Signed)
Agree pt needs to go to ER

## 2017-03-06 ENCOUNTER — Encounter (HOSPITAL_COMMUNITY): Payer: Self-pay | Admitting: *Deleted

## 2017-03-06 ENCOUNTER — Telehealth: Payer: Self-pay

## 2017-03-06 ENCOUNTER — Emergency Department (HOSPITAL_COMMUNITY): Payer: Medicaid Other

## 2017-03-06 ENCOUNTER — Other Ambulatory Visit: Payer: Self-pay

## 2017-03-06 ENCOUNTER — Emergency Department (HOSPITAL_COMMUNITY)
Admission: EM | Admit: 2017-03-06 | Discharge: 2017-03-06 | Disposition: A | Discharge status: Home / Self Care | Payer: Medicaid Other | Attending: Emergency Medicine | Admitting: Emergency Medicine

## 2017-03-06 DIAGNOSIS — Z96641 Presence of right artificial hip joint: Secondary | ICD-10-CM | POA: Diagnosis not present

## 2017-03-06 DIAGNOSIS — Z79899 Other long term (current) drug therapy: Secondary | ICD-10-CM | POA: Diagnosis not present

## 2017-03-06 DIAGNOSIS — R05 Cough: Secondary | ICD-10-CM | POA: Diagnosis present

## 2017-03-06 DIAGNOSIS — I130 Hypertensive heart and chronic kidney disease with heart failure and stage 1 through stage 4 chronic kidney disease, or unspecified chronic kidney disease: Secondary | ICD-10-CM | POA: Diagnosis not present

## 2017-03-06 DIAGNOSIS — R859 Unspecified abnormal finding in specimens from digestive organs and abdominal cavity: Secondary | ICD-10-CM | POA: Insufficient documentation

## 2017-03-06 DIAGNOSIS — Z794 Long term (current) use of insulin: Secondary | ICD-10-CM | POA: Insufficient documentation

## 2017-03-06 DIAGNOSIS — I5032 Chronic diastolic (congestive) heart failure: Secondary | ICD-10-CM | POA: Insufficient documentation

## 2017-03-06 DIAGNOSIS — Z7902 Long term (current) use of antithrombotics/antiplatelets: Secondary | ICD-10-CM | POA: Diagnosis not present

## 2017-03-06 DIAGNOSIS — Z96653 Presence of artificial knee joint, bilateral: Secondary | ICD-10-CM | POA: Insufficient documentation

## 2017-03-06 DIAGNOSIS — E1122 Type 2 diabetes mellitus with diabetic chronic kidney disease: Secondary | ICD-10-CM | POA: Diagnosis not present

## 2017-03-06 DIAGNOSIS — N189 Chronic kidney disease, unspecified: Secondary | ICD-10-CM | POA: Diagnosis not present

## 2017-03-06 DIAGNOSIS — R000 Tachycardia, unspecified: Secondary | ICD-10-CM | POA: Diagnosis not present

## 2017-03-06 LAB — COMPREHENSIVE METABOLIC PANEL
ALK PHOS: 101 U/L (ref 38–126)
ALT: 19 U/L (ref 14–54)
ANION GAP: 10 (ref 5–15)
AST: 26 U/L (ref 15–41)
Albumin: 3.9 g/dL (ref 3.5–5.0)
BILIRUBIN TOTAL: 0.6 mg/dL (ref 0.3–1.2)
BUN: 20 mg/dL (ref 6–20)
CALCIUM: 9.4 mg/dL (ref 8.9–10.3)
CO2: 22 mmol/L (ref 22–32)
Chloride: 103 mmol/L (ref 101–111)
Creatinine, Ser: 1.09 mg/dL — ABNORMAL HIGH (ref 0.44–1.00)
GFR calc Af Amer: 54 mL/min — ABNORMAL LOW (ref >60)
GFR, EST NON AFRICAN AMERICAN: 47 mL/min — AB (ref >60)
Glucose, Bld: 274 mg/dL — ABNORMAL HIGH (ref 65–99)
POTASSIUM: 5 mmol/L (ref 3.5–5.1)
Sodium: 135 mmol/L (ref 135–145)
TOTAL PROTEIN: 7.1 g/dL (ref 6.5–8.1)

## 2017-03-06 LAB — CBC
HEMATOCRIT: 39.8 % (ref 36.0–46.0)
HEMOGLOBIN: 13 g/dL (ref 12.0–15.0)
MCH: 27.8 pg (ref 26.0–34.0)
MCHC: 32.7 g/dL (ref 30.0–36.0)
MCV: 85 fL (ref 78.0–100.0)
Platelets: 239 10*3/uL (ref 150–400)
RBC: 4.68 MIL/uL (ref 3.87–5.11)
RDW: 12.9 % (ref 11.5–15.5)
WBC: 7.6 10*3/uL (ref 4.0–10.5)

## 2017-03-06 LAB — POC OCCULT BLOOD, ED: FECAL OCCULT BLD: NEGATIVE

## 2017-03-06 MED ORDER — RANITIDINE HCL 150 MG PO TABS: 150.0000 mg | ORAL_TABLET | Freq: Two times a day (BID) | ORAL | 0 refills | Status: DC

## 2017-03-06 NOTE — ED Notes (Signed)
Pt verbalized understanding discharge instructions and denies any further needs or questions at this time. VS stable, ambulatory and steady gait.   

## 2017-03-06 NOTE — ED Notes (Signed)
Family up to nurse first asking about test results. Explained that patient should be roomed soon and that EDP will discuss results at that time.

## 2017-03-06 NOTE — ED Triage Notes (Addendum)
Pt states sometimes she feels she has blood in her mouth and she goes to the bathroom and spits out blood.  States cough, but denies hemoptysis.  Denies bloody/darkstools, mouth pain, but does c/o dizziness sometimes.

## 2017-03-06 NOTE — ED Notes (Signed)
Pt ambulatory w/ steady gait.

## 2017-03-06 NOTE — Telephone Encounter (Signed)
Patient granddaughter Kathlee Nations called to schedule an appointment for patient. According to Kathlee Nations patient has been in [place] for a week and just got back last night. Per liz patient has been coughing/spitting up blood, patient is also experiencing discomfort with her right hip also is feeling fatigue.   Per liz she has not seen the patient to see how much blood the patient is coughing/spitting up she is going off of what was told to her when the patient was in [place].   Liz was instructed to take patient to the ER to be assessed. Kathlee Nations states she has to work and would like to schedule an appointment for patient to be seen in office on 03/09/2017 and if she is able to take patient to the ER over the weekend then she will.

## 2017-03-06 NOTE — ED Provider Notes (Signed)
[facility] EMERGENCY DEPARTMENT Provider Note   CSN: [id number] Arrival date & time: 03/06/17  1126   History   Chief Complaint Chief Complaint  Patient presents with  . GI Bleeding    HPI Joyce Gilmore is a 83 y.o. female with a hx of HFpEF, pulmonary HTN,  CKD, DM, depression, and HTN who presents to the ED with complaint of blood in saliva intermittently x 3 weeks.  States that she will note saliva in the mouth and will then proceed to spit it out in the bathroom.  States that when she spits it out the saliva it is streaked with red blood, not overly dark or overly bright in color, not coffee ground in appearance, no clots.  States this happens 1-2 times per day, not necessarily daily.  States after she spits a few times this resolves.  The blood in her saliva specifically does not occur with coughing or vomiting, she denies both cough and vomiting. She is unsure where the blood is coming from. She does state however that when she is spitting out the blood she notes some dizziness described as the room spinning which also resolves after spitting.  No dizziness at any other time.  Denies syncope.  Denies lightheadedness.  Denies change in vision, numbness, weakness, chest pain, shortness of breath, nausea, vomiting, diarrhea, blood in stool, or abdominal pain. Denies fever or chills.   Translator used throughout Sales executive.   HPI  Past Medical History:  Diagnosis Date  . Arthritis   . CHF (congestive heart failure) (HCC)    diastolic  . Chronic kidney disease    hx of kidney stones,   . Depression    hx of   . Diabetes mellitus without complication ([place])   . Ganglion, left ankle and foot   . Hearing loss of both ears   . Hypertension   . MRSA infection    Oct 13 - Nov 13  . Osteoporosis   . Tinnitus of both ears     Patient Active Problem List   Diagnosis Date Noted  . Uses hearing aid 09/12/2016  . Asthmatic bronchitis 03/11/2016  . Chronic diastolic  heart failure ([place]) 10/12/2015  . Shortness of breath 10/12/2015  . Seasonal allergies 11/21/2014  . DDD (degenerative disc disease), lumbar 10/11/2014  . H/O compression fracture of spine 10/11/2014  . Pulmonary hypertension ([place])   . Hepatic cirrhosis ([place]) 09/24/2014  . SIADH (syndrome of inappropriate ADH production) ([place]) 06/13/2014  . Insomnia 12/28/2013  . Anemia 07/06/2013  . HLD (hyperlipidemia) 06/27/2013  . Osteoporosis 07/26/2012  . Hyponatremia 05/16/2011  . Type 2 diabetes mellitus with diabetic chronic kidney disease ([place]) 08/17/2006  . Essential hypertension 08/17/2006  . DIVERTICULOSIS, COLON 08/17/2006    Past Surgical History:  Procedure Laterality Date  . BACK SURGERY     history restored after error with record merge      . INCISION AND DRAINAGE HIP  12/22/2011   Procedure: IRRIGATION AND DEBRIDEMENT HIP;  Surgeon: Mcarthur Rossetti, MD;  Location: [facility];  Service: Orthopedics;  Laterality: Right;  Irrigation and debridement right hip  . INCISION AND DRAINAGE HIP  12/27/2011   Procedure: IRRIGATION AND DEBRIDEMENT HIP;  Surgeon: Mcarthur Rossetti, MD;  Location: [facility];  Service: Orthopedics;  Laterality: Right;  Repeat irrigation and debridement  . JOINT REPLACEMENT     bilateral knee, right hip   . TOTAL HIP REVISION  12/05/2011   Procedure: TOTAL HIP REVISION;  Surgeon: Mcarthur Rossetti, MD;  Location: [facility];  Service: Orthopedics;  Laterality: Right;  Right Hip Revision Arthroplasty to Total Hip, Excision of Old Implant    OB History    No data available       Home Medications    Prior to Admission medications   Medication Sig Start Date End Date Taking? Authorizing Provider  ACCU-CHEK SOFTCLIX LANCETS lancets USE AS DIRECTED 12/15/16   Alycia Rossetti, MD  albuterol (PROVENTIL HFA;VENTOLIN HFA) 108 (90 Base) MCG/ACT inhaler Inhale 1-2 puffs into the lungs every 4 (four) hours as needed for wheezing or shortness of breath. 09/12/16    Alycia Rossetti, MD  albuterol (PROVENTIL) (2.5 MG/3ML) 0.083% nebulizer solution Take 3 mLs (2.5 mg total) by nebulization every 6 (six) hours as needed for wheezing or shortness of breath. 03/11/16   [place], Modena Nunnery, MD  amLODipine (NORVASC) 5 MG tablet Take 1 tablet (5 mg total) by mouth daily. 12/15/16   [place], Modena Nunnery, MD  benzonatate (TESSALON) 100 MG capsule TAKE 1 CAPSULE(100 MG) BY MOUTH TWICE DAILY AS NEEDED FOR COUGH 12/08/16   [place], Modena Nunnery, MD  blood glucose meter kit and supplies KIT Dispense based on patient and insurance preference. Use up to four times daily as directed. (FOR ICD-9 250.00, 250.01). 04/25/14   Dena Billet B, PA-C  Fluticasone Furoate (ARNUITY ELLIPTA) 100 MCG/ACT AEPB Inhale 1 puff into the lungs daily. 01/19/17   Alycia Rossetti, MD  furosemide (LASIX) 20 MG tablet Take 1 tablet (20 mg total) by mouth daily. For fluid - spanish speaking 12/15/16   Vic Blackbird F, MD  glucose blood (ACCU-CHEK AVIVA PLUS) test strip USE TO CHECK BLOOD SUGAR FOUR TIMES DAILY AS DIRECTED 12/08/16   Alycia Rossetti, MD  insulin aspart (NOVOLOG FLEXPEN) 100 UNIT/ML FlexPen FOLLOW SLIDING SCALE AS DIRECTED 12/08/16   [place], Modena Nunnery, MD  Insulin Glargine (LANTUS SOLOSTAR) 100 UNIT/ML Solostar Pen Inject 38 Units into the skin daily at 10 pm. 12/08/16   Alycia Rossetti, MD  Insulin Pen Needle (B-D ULTRAFINE III SHORT PEN) 31G X 8 MM MISC USE AS DIRECTED 12/08/16   Alycia Rossetti, MD  lansoprazole (PREVACID) 30 MG capsule TAKE 1 CAPSULE BY MOUTH TWICE DAILY BEFORE A MEAL 06/10/16   [place], Modena Nunnery, MD  lisinopril (PRINIVIL,ZESTRIL) 10 MG tablet Take 1 tablet (10 mg total) by mouth daily. 12/15/16   Alycia Rossetti, MD  metoprolol tartrate (LOPRESSOR) 25 MG tablet TAKE 1 TABLET BY MOUTH TWICE DAILY 10/30/16   Alycia Rossetti, MD  metoprolol tartrate (LOPRESSOR) 25 MG tablet Take 1 tablet (25 mg total) by mouth 2 (two) times daily. 12/08/16   [place], Modena Nunnery, MD    nystatin cream (MYCOSTATIN) Apply 1 application topically 2 (two) times daily. 12/17/15   Alycia Rossetti, MD  pravastatin (PRAVACHOL) 20 MG tablet Take 1 tablet (20 mg total) by mouth daily. 12/15/16   [place], Modena Nunnery, MD  sucralfate (CARAFATE) 1 g tablet Take 1 tablet (1 g total) by mouth 4 (four) times daily. 06/10/16   [place], Modena Nunnery, MD  traMADol (ULTRAM) 50 MG tablet Take 2 tablets (100 mg total) by mouth 3 (three) times daily as needed. 11/27/16   Mcarthur Rossetti, MD    Family History Family History  Problem Relation Age of Onset  . Hypertension Mother   . Colon cancer Neg Hx   . Breast cancer Neg Hx     Social History Social  History   Tobacco Use  . Smoking status: Never Smoker  . Smokeless tobacco: Never Used  Substance Use Topics  . Alcohol use: No    Alcohol/week: 0.0 oz  . Drug use: No     Allergies   Bactrim [sulfamethoxazole-trimethoprim]; Rifampin; and Vancomycin   Review of Systems Review of Systems  Constitutional: Negative for chills and fever.  HENT: Negative for congestion, ear pain, rhinorrhea and sore throat.        Positive for intermittent blood in saliva.   Eyes: Negative for visual disturbance.  Respiratory: Negative for cough and shortness of breath.   Cardiovascular: Negative for chest pain, palpitations and leg swelling.  Gastrointestinal: Negative for abdominal pain, blood in stool, constipation, diarrhea, nausea and vomiting.  Neurological: Positive for dizziness (intermittent not at present). Negative for syncope, weakness, light-headedness, numbness and headaches.  All other systems reviewed and are negative.   Physical Exam Updated Vital Signs BP (!) 169/74 (BP Location: Right Arm)   Pulse 82   Temp 98.3 F (36.8 C) (Oral)   Resp 16   Ht 5' (1.524 m)   Wt 72.6 kg (160 lb)   SpO2 96%   BMI 31.25 kg/m   Physical Exam  Constitutional: She appears well-developed and well-nourished.  Non-toxic appearance. No  distress.  HENT:  Head: Normocephalic and atraumatic.  Mouth/Throat: Uvula is midline and oropharynx is clear and moist. No lacerations.  Eyes: Conjunctivae and EOM are normal. Pupils are equal, round, and reactive to light. Right eye exhibits no discharge. Left eye exhibits no discharge.  Cardiovascular: Normal rate and regular rhythm.  No murmur heard. Pulmonary/Chest: Breath sounds normal. No respiratory distress. She has no wheezes. She has no rales.  Abdominal: Soft. She exhibits no distension. There is no tenderness. There is no rigidity, no rebound, no guarding, no CVA tenderness and negative Murphy's sign.  Genitourinary: Rectal exam shows external hemorrhoid (one present at 12:00, another present at 6:00. no thrombosis noted. ). Rectal exam shows guaiac negative stool.  Neurological:  Alert. Clear speech. No facial droop. CNIII-XII are intact. Bilateral upper and lower extremities' sensation intact to sharp and dull touch. 5/5 grip strength bilaterally. 5/5 plantar and dorsi flexion bilaterally.  Normal finger to nose bilaterally. Negative pronator drift. . Gait is normal.   Skin: Skin is warm and dry. No rash noted.  Psychiatric: She has a normal mood and affect. Her behavior is normal.  Nursing note and vitals reviewed.   ED Treatments / Results  Labs Results for orders placed or performed during the hospital encounter of 03/06/17  Comprehensive metabolic panel  Result Value Ref Range   Sodium 135 135 - 145 mmol/L   Potassium 5.0 3.5 - 5.1 mmol/L   Chloride 103 101 - 111 mmol/L   CO2 22 22 - 32 mmol/L   Glucose, Bld 274 (H) 65 - 99 mg/dL   BUN 20 6 - 20 mg/dL   Creatinine, Ser 1.09 (H) 0.44 - 1.00 mg/dL   Calcium 9.4 8.9 - 10.3 mg/dL   Total Protein 7.1 6.5 - 8.1 g/dL   Albumin 3.9 3.5 - 5.0 g/dL   AST 26 15 - 41 U/L   ALT 19 14 - 54 U/L   Alkaline Phosphatase 101 38 - 126 U/L   Total Bilirubin 0.6 0.3 - 1.2 mg/dL   GFR calc non Af Amer 47 (L) >60 mL/min   GFR calc Af  Amer 54 (L) >60 mL/min   Anion gap 10 5 - 15  CBC  Result Value Ref Range   WBC 7.6 4.0 - 10.5 K/uL   RBC 4.68 3.87 - 5.11 MIL/uL   Hemoglobin 13.0 12.0 - 15.0 g/dL   HCT 39.8 36.0 - 46.0 %   MCV 85.0 78.0 - 100.0 fL   MCH 27.8 26.0 - 34.0 pg   MCHC 32.7 30.0 - 36.0 g/dL   RDW 12.9 11.5 - 15.5 %   Platelets 239 150 - 400 K/uL  POC occult blood, ED  Result Value Ref Range   Fecal Occult Bld NEGATIVE NEGATIVE  Type and screen Naomi  Result Value Ref Range   ABO/RH(D) O NEG    Antibody Screen POS    Sample Expiration 03/09/2017    Antibody Identification ANTI D    Unit Number [id number]    Blood Component Type RED CELLS,LR    Unit division 00    Status of Unit ALLOCATED    Transfusion Status OK TO TRANSFUSE    Crossmatch Result COMPATIBLE    Unit Number [id number]    Blood Component Type RED CELLS,LR    Unit division 00    Status of Unit ALLOCATED    Transfusion Status OK TO TRANSFUSE    Crossmatch Result COMPATIBLE   BPAM RBC  Result Value Ref Range   Blood Product Unit Number [id number]    Unit Type and Rh 9500    Blood Product Expiration Date 093267124580    Blood Product Unit Number [id number]    Unit Type and Rh 9500    Blood Product Expiration Date 673419379024     EKG  EKG Interpretation  Date/Time:  Friday March 06 2017 13:09:08 EST Ventricular Rate:  129 PR Interval:  144 QRS Duration: 80 QT Interval:  186 QTC Calculation: 272 R Axis:     Text Interpretation:  Sinus tachycardia with frequent Premature ventricular complexes in a pattern of bigeminy Possible Lateral infarct , age undetermined Inferior infarct , age undetermined Abnormal ECG Confirmed by Nat Christen [phone]) on 03/06/2017 6:45:31 PM      Radiology Dg Chest 2 View  Result Date: 03/06/2017 CLINICAL DATA:  Hemoptysis.  Diabetes.  Hypertension. EXAM: CHEST  2 VIEW COMPARISON:  02/21/2016 FINDINGS: Midline trachea. Cardiomegaly accentuated by AP portable  technique. No pleural effusion or pneumothorax. No congestive failure. Clear lungs. Midthoracic severe compression deformity is similar to on the prior. IMPRESSION: Mild cardiomegaly, without acute disease. Chronic midthoracic compression deformity, severe. Electronically Signed   By: Abigail Miyamoto M.D.   On: 03/06/2017 19:40    Procedures Procedures (including critical care time)  Medications Ordered in ED Medications - No data to display  Initial Impression / Assessment and Plan / ED Course  I have reviewed the triage vital signs and the nursing notes.  Pertinent labs & imaging results that were available during my care of the patient were reviewed by me and considered in my medical decision making (see chart for details).  Patient presents with complaint of intermittent blood in saliva with associated mild dizziness x 3 weeks.  She specifically states that she is not coughing up blood, she has not been vomiting.  Additionally dizziness is specific to when she is spitting out saliva, it does not occur any other time, and resolves following spitting.  Patient is nontoxic-appearing, vitals are within normal limits other than blood pressure which has been intermittently elevated in the emergency department, hx of HTN.  Screening labs revealed hyperglycemia as well as creatinine of 1.09.  Patient has  a hx of diabetes and her kidney function is at baseline.  Of note there is no leukocytosis.  Hemoglobin is 13.0, this is consistent with patient's baseline. Hemoccult negative. No acute abnormality on CXR. EKG without significant change from previous on review, patient without chest pain or dyspnea.. Patient's description of symptoms does not seem consistent with hemoptysis, additionally she denies leg pain/swelling, recent surgery/trauma, recent long travel, hormone use, personal hx of cancer, or hx of DVT/PE, therefore doubt pulmonary embolism. Patient's stool is hemoccult negative and hemoglobin is stable  at 13.0. Unclear etiology to patient's symptoms at this time. Will treat with H2 blocker. Patient has scheduled PCP appointment for Monday (01/14). I discussed results, treatment plan, need for PCP follow-up as scheduled, and return precautions with the patient and her family. Provided opportunity for questions, patient and family confirmed understanding and are in agreement with plan.    Final Clinical Impressions(s) / ED Diagnoses   Final diagnoses:  Saliva abnormality    ED Discharge Orders        Ordered    ranitidine (ZANTAC) 150 MG tablet  2 times daily     03/06/17 2043       Amaryllis Dyke, PA-C 03/07/17 0127    Nat Christen, MD 03/09/17 [phone]

## 2017-03-06 NOTE — ED Notes (Addendum)
Blood bank called pt has antibodies detected in type and screen and will require extra time to get blood ready if needed for a transfusion

## 2017-03-06 NOTE — Discharge Instructions (Signed)
You were seen in the emergency department due to concern for blood in your saliva.  The lab work performed in the emergency department did not show any signs of infection, anemia, electrolyte abnormality, or problems with your liver function.  Your kidney function is similar to previous lab work.  The stool sample that we obtained did not have any blood in it.  Your blood glucose was elevated in the emergency department at 274, it is important that you take your diabetes medicines as prescribed and have this rechecked by your primary care provider.  Your chest x-ray did show a compression fracture of your vertebra that has been present for an extended period of time, this is not new or changed.  We are not sure of the exact cause of your symptoms.  It will be important that you call your primary care office on Monday in order to set up a follow-up appointment for sometime this upcoming week for further evaluation and management.  Return to the emergency department for any new or worsening symptoms including but not limited to increased amount of blood, blood in your stool, inability to keep down food or fluids, or pain in your chest or abdomen.  Or any other concerns.  If you cannot see your primary care provider we have also provided information about our community clinic in your discharge instructions, you may call and try to make an appointment to see someone in our community clinic as an alternative to your primary care.  English to [place] Translation via GOOGLE:  Usted fue atendido en el departamento de emergencias debido a la preocupacin por la sangre en su saliva. El trabajo de laboratorio realizado en el departamento de emergencias no mostr signos de infeccin, anemia, anomalas en los electrolitos ni problemas con la funcin heptica. Su funcin renal es similar al [place] de laboratorio anterior. La muestra de heces que obtuvimos no tena sangre.  Su nivel de glucosa en la sangre se elev  en el departamento de emergencias en 36, es importante que tome sus medicamentos para la diabetes segn lo prescrito y que su proveedor de atencin primaria lo vuelva a Health visitor.  Su radiografa de trax mostr una fractura por compresin de su vrtebra que [employer] un perodo prolongado de [place], esto no es nuevo ni ha [place].  No estamos seguros de la causa exacta de sus sntomas. Ser importante que llame a su oficina de atencin primaria el lunes para programar una cita de seguimiento para la prxima semana para una evaluacin y Freight forwarder. Regrese al departamento de emergencias para cualquier sntoma nuevo o que empeore, que [place], [place] otros, [place] mayor cantidad de [place], sangre en las heces, incapacidad para retener alimentos o lquidos, o dolor en el pecho o el abdomen. O cualquier otra inquietud.  Si no puede ver a su proveedor de atencin primaria, tambin le brindamos informacin sobre nuestra clnica comunitaria en sus instrucciones de alta, puede llamar e intentar programar una cita para ver a alguien en nuestra clnica comunitaria como una alternativa a su atencin primaria.

## 2017-03-09 LAB — TYPE AND SCREEN
ABO/RH(D): O NEG
ANTIBODY SCREEN: POSITIVE
UNIT DIVISION: 0
UNIT DIVISION: 0

## 2017-03-09 LAB — BPAM RBC
BLOOD PRODUCT EXPIRATION DATE: 201902042359
Blood Product Expiration Date: 201902032359
Unit Type and Rh: 9500
Unit Type and Rh: 9500

## 2017-03-10 ENCOUNTER — Encounter: Payer: Self-pay | Admitting: Family Medicine

## 2017-03-10 ENCOUNTER — Ambulatory Visit (INDEPENDENT_AMBULATORY_CARE_PROVIDER_SITE_OTHER): Payer: Medicaid Other | Admitting: Family Medicine

## 2017-03-10 ENCOUNTER — Other Ambulatory Visit: Payer: Self-pay

## 2017-03-10 VITALS — BP 128/72 | HR 68 | Temp 98.4°F | Resp 16 | Ht <= 58 in | Wt 156.0 lb

## 2017-03-10 DIAGNOSIS — K1379 Other lesions of oral mucosa: Secondary | ICD-10-CM | POA: Diagnosis not present

## 2017-03-10 DIAGNOSIS — R0981 Nasal congestion: Secondary | ICD-10-CM

## 2017-03-10 DIAGNOSIS — K219 Gastro-esophageal reflux disease without esophagitis: Secondary | ICD-10-CM | POA: Diagnosis not present

## 2017-03-10 DIAGNOSIS — J4540 Moderate persistent asthma, uncomplicated: Secondary | ICD-10-CM

## 2017-03-10 MED ORDER — AMLODIPINE BESYLATE 5 MG PO TABS: 5.0000 mg | ORAL_TABLET | Freq: Every day | ORAL | 3 refills | Status: DC

## 2017-03-10 MED ORDER — ALBUTEROL SULFATE HFA 108 (90 BASE) MCG/ACT IN AERS: 1.0000 | INHALATION_SPRAY | RESPIRATORY_TRACT | 3 refills | Status: DC | PRN

## 2017-03-10 MED ORDER — FLUTICASONE PROPIONATE 50 MCG/ACT NA SUSP
2.0000 | Freq: Every day | NASAL | 1 refills | Status: DC
Start: 2017-03-10 — End: 2017-06-11

## 2017-03-10 MED ORDER — INSULIN GLARGINE 100 UNIT/ML SOLOSTAR PEN: 36.0000 [IU] | PEN_INJECTOR | Freq: Every day | SUBCUTANEOUS | 11 refills | Status: DC

## 2017-03-10 NOTE — Assessment & Plan Note (Signed)
Will take Zantac for a total of 2 weeks and discontinued she can continue with her proton pump inhibitor and Carafate.  Based on history and examination this was not a GI bleed.  I think that the bleeding that she noticed in the saliva came from a possible posterior nasal bleed which is now resolved.  I am to have her use some Flonase spray to help the congestion.  She does get recurrent active bleeding we will get her set up with ENT to visualize the source

## 2017-03-10 NOTE — Patient Instructions (Addendum)
Use nasal spray - 1 week  Stop the ranitindine on Jan 25th F/U as scheduled

## 2017-03-10 NOTE — Progress Notes (Signed)
Subjective:    Patient ID: Joyce Gilmore, female    DOB: [birth year], 83 y.o.   MRN: [id number]  Patient presents for ER F/U (was seen in ER for hemoptysis- given Zantac- has improved)  Pt here for ER follow up, has underlying asthmatic bronchitis. We received a call when she was in [place] that she had "blood in her mouth" advised to go to ER there. She saw her PCP their who recommended back to [place] to be evaluated.  She states that she had 3 days where she noticed blood in her saliva she had some congestion in her sinus region but did not have an actual nosebleed she had a mild headache and felt a little dizzy.  But she denies coughing up any blood denies any emesis or change in bowels.  Denies any hematuria.  It stopped after 3 days she waited about a week and then it returned so she flew back to [place].  She was seen in the emergency room on the 11th she not had any blood in about a day or so when she was evaluated.  Evaluation showed stable hemoglobin renal function her glucose was elevated at 273 her chest x-ray was unremarkable fecal occult blood test was done which was negative.  She states that she feels well she does not have any dizziness down her appetite is better.  Her blood sugars were up and down when she was in [place] but admits she was eating a lot of carbs and foods that she does not typically eat.  Her blood sugars the past few days have been from 120-140 fasting. She was prescribed ranitidine to take twice a day and told to follow-up here in the office.  Of note she is already on Carafate as well as Prevacid   CXR- Mild cardiology, no PNA, or edema noted  Weight is fairly stable between 155-160lbs over past year   DM- Lasst A1C 7.4% in October   Interpreter present and Daughter  Review Of Systems:  GEN- denies fatigue, fever, weight loss,weakness, recent illness HEENT- denies eye drainage, change in vision, nasal discharge, CVS- denies chest pain, palpitations RESP- denies  SOB, cough, wheeze ABD- denies N/V, change in stools, abd pain GU- denies dysuria, hematuria, dribbling, incontinence MSK- denies joint pain, muscle aches, injury Neuro- denies headache, dizziness, syncope, seizure activity       Objective:    BP 128/72   Pulse 68   Temp 98.4 F (36.9 C) (Oral)   Resp 16   Ht 4\' 8"  (1.422 m)   Wt 156 lb (70.8 kg)   SpO2 94%   BMI 34.97 kg/m  GEN- NAD, alert and oriented x3 HEENT- PERRL, EOMI, non injected sclera, pink conjunctiva, MMM, oropharynx clear, nares enlarged right turbinate , no blood in nares, TM clear bilat, no effusion Neck- Supple, no thyromegaly, no LAD CVS- RRR, no murmur RESP-CTAB ABD-NABS,soft,NT,ND EXT- No edema Pulses- Radial 2+        Assessment & Plan:      Problem List Items Addressed This Visit      Unprioritized   GERD (gastroesophageal reflux disease)    Will take Zantac for a total of 2 weeks and discontinued she can continue with her proton pump inhibitor and Carafate.  Based on history and examination this was not a GI bleed.  I think that the bleeding that she noticed in the saliva came from a possible posterior nasal bleed which is now resolved.  I am to have  her use some Flonase spray to help the congestion.  She does get recurrent active bleeding we will get her set up with ENT to visualize the source      Asthmatic bronchitis    Currently at her baseline, regards to her breathing.  She is out of her albuterol inhaler which we will refill today.  She is also on the are Arnuity      Relevant Medications   albuterol (PROVENTIL HFA;VENTOLIN HFA) 108 (90 Base) MCG/ACT inhaler    Other Visit Diagnoses    Blood in mouth of unknown source    -  Primary   Nasal congestion          Note: This dictation was prepared with Dragon dictation along with smaller phrase technology. Any transcriptional errors that result from this process are unintentional.

## 2017-03-10 NOTE — Assessment & Plan Note (Signed)
Currently at her baseline, regards to her breathing.  She is out of her albuterol inhaler which we will refill today.  She is also on the are Arnuity

## 2017-03-11 ENCOUNTER — Ambulatory Visit (INDEPENDENT_AMBULATORY_CARE_PROVIDER_SITE_OTHER): Payer: Medicaid Other

## 2017-03-11 ENCOUNTER — Ambulatory Visit (INDEPENDENT_AMBULATORY_CARE_PROVIDER_SITE_OTHER): Payer: Medicaid Other | Admitting: Orthopaedic Surgery

## 2017-03-11 ENCOUNTER — Encounter (INDEPENDENT_AMBULATORY_CARE_PROVIDER_SITE_OTHER): Payer: Self-pay | Admitting: Orthopaedic Surgery

## 2017-03-11 DIAGNOSIS — M5431 Sciatica, right side: Secondary | ICD-10-CM

## 2017-03-11 DIAGNOSIS — M7061 Trochanteric bursitis, right hip: Secondary | ICD-10-CM

## 2017-03-11 NOTE — Progress Notes (Signed)
Office Visit Note   Patient: Joyce Gilmore           Date of Birth: 19-Jul-1934           MRN: [id number] Visit Date: 03/11/2017              Requested by: Alycia Rossetti, MD [address] [place], [place] [address] PCP: Alycia Rossetti, MD   Assessment & Plan: Visit Diagnoses:  1. Trochanteric bursitis of right hip   2. Sciatica, right side     Plan: We will send her to physical therapy for her back and right hip trochanteric bursitis. Continue Aleve for pain.  Questions encouraged and answered at length by Dr. Ninfa Linden and myself. Follow-Up Instructions: Return in about 4 weeks (around 04/08/2017).   Orders:  Orders Placed This Encounter  Procedures  . XR HIP UNILAT W OR W/O PELVIS 1V RIGHT  . XR Lumbar Spine 2-3 Views   No orders of the defined types were placed in this encounter.     Procedures: No procedures performed   Clinical Data: No additional findings.   Subjective: Chief Complaint  Patient presents with  . Right Hip - Pain    HPI Joyce Gilmore is an 83 year old Spanish-speaking female who comes in today for right hip and buttocks pain.  No known injury. She denies any groin pain.  Pain is been ongoing for approximately 1 month.  No known injury.  Pain is better with sitting worse with standing.  She is tried Aleve with some relief.  She does note some pain and numbness in both legs at times. History of right hip arthroplasty revision 12/05/2011 by Dr. Ninfa Linden.  She is diabetic poorly controlled last glucose on record 274 this was 5 days ago.  Aleve helps some with her pain. Spanish-speaking interpreter was used to communicate with patient and her family was present today. Review of Systems See HPI otherwise negative  Objective: Vital Signs: There were no vitals taken for this visit.  Physical Exam  Constitutional: She is oriented to person, place, and time. She appears well-developed and well-nourished. No distress.  Pulmonary/Chest: Effort  normal.  Neurological: She is alert and oriented to person, place, and time.  Skin: Skin is warm and dry. She is not diaphoretic.  Psychiatric: She has a normal mood and affect.    Ortho Exam 5 out of 5 strength throughout the lower extremities against resistance.  Negative straight leg raise bilaterally.  Tight hamstrings bilaterally.  She is unable to touch her toes coming within 3-4 inches of touching her toes.  She has limited extension of the lumbar spine.  Good range of motion of both hips.  Tenderness over the right trochanteric bursa. Specialty Comments:  No specialty comments available.  Imaging: Xr Hip Unilat W Or W/o Pelvis 1v Right  Result Date: 03/11/2017 AP pelvis: No acute fracture.  Bilateral hips well located.  Status post right total hip arthroplasty with well-seated components.  Well-healed old right rami fractures are noted.  Xr Lumbar Spine 2-3 Views  Result Date: 03/11/2017 Lumbar spine AP lateral views: Degenerative scoliosis with degenerative disc disease at multiple levels.  Grade 1 anterior spinal listhesis L4 on L5.  No acute fractures.    PMFS History: Patient Active Problem List   Diagnosis Date Noted  . GERD (gastroesophageal reflux disease) 03/10/2017  . Uses hearing aid 09/12/2016  . Asthmatic bronchitis 03/11/2016  . Chronic diastolic heart failure ([place]) 10/12/2015  . Shortness  of breath 10/12/2015  . Seasonal allergies 11/21/2014  . DDD (degenerative disc disease), lumbar 10/11/2014  . H/O compression fracture of spine 10/11/2014  . Pulmonary hypertension ([place])   . Hepatic cirrhosis ([place]) 09/24/2014  . SIADH (syndrome of inappropriate ADH production) ([place]) 06/13/2014  . Insomnia 12/28/2013  . Anemia 07/06/2013  . HLD (hyperlipidemia) 06/27/2013  . Osteoporosis 07/26/2012  . Hyponatremia 05/16/2011  . Type 2 diabetes mellitus with diabetic chronic kidney disease ([facility]) 08/17/2006  . Essential hypertension 08/17/2006  . DIVERTICULOSIS, COLON  08/17/2006   Past Medical History:  Diagnosis Date  . Arthritis   . CHF (congestive heart failure) (HCC)    diastolic  . Chronic kidney disease    hx of kidney stones,   . Depression    hx of   . Diabetes mellitus without complication ([place])   . Ganglion, left ankle and foot   . Hearing loss of both ears   . Hypertension   . MRSA infection    Oct 13 - Nov 13  . Osteoporosis   . Tinnitus of both ears     Family History  Problem Relation Age of Onset  . Hypertension Mother   . Colon cancer Neg Hx   . Breast cancer Neg Hx     Past Surgical History:  Procedure Laterality Date  . BACK SURGERY     history restored after error with record merge      . INCISION AND DRAINAGE HIP  12/22/2011   Procedure: IRRIGATION AND DEBRIDEMENT HIP;  Surgeon: Mcarthur Rossetti, MD;  Location: [facility];  Service: Orthopedics;  Laterality: Right;  Irrigation and debridement right hip  . INCISION AND DRAINAGE HIP  12/27/2011   Procedure: IRRIGATION AND DEBRIDEMENT HIP;  Surgeon: Mcarthur Rossetti, MD;  Location: [facility];  Service: Orthopedics;  Laterality: Right;  Repeat irrigation and debridement  . JOINT REPLACEMENT     bilateral knee, right hip   . TOTAL HIP REVISION  12/05/2011   Procedure: TOTAL HIP REVISION;  Surgeon: Mcarthur Rossetti, MD;  Location: [facility];  Service: Orthopedics;  Laterality: Right;  Right Hip Revision Arthroplasty to Total Hip, Excision of Old Implant   Social History   Occupational History  . Not on file  Tobacco Use  . Smoking status: Never Smoker  . Smokeless tobacco: Never Used  Substance and Sexual Activity  . Alcohol use: No    Alcohol/week: 0.0 oz  . Drug use: No  . Sexual activity: No

## 2017-03-16 ENCOUNTER — Other Ambulatory Visit: Payer: Self-pay

## 2017-03-16 ENCOUNTER — Encounter: Payer: Self-pay | Admitting: Family Medicine

## 2017-03-16 ENCOUNTER — Ambulatory Visit: Payer: Medicaid Other | Admitting: Family Medicine

## 2017-03-16 VITALS — BP 122/72 | HR 82 | Temp 98.6°F | Resp 22 | Ht <= 58 in | Wt 158.0 lb

## 2017-03-16 DIAGNOSIS — J0100 Acute maxillary sinusitis, unspecified: Secondary | ICD-10-CM | POA: Diagnosis not present

## 2017-03-16 DIAGNOSIS — J4540 Moderate persistent asthma, uncomplicated: Secondary | ICD-10-CM

## 2017-03-16 MED ORDER — IPRATROPIUM-ALBUTEROL 0.5-2.5 (3) MG/3ML IN SOLN
3.0000 mL | Freq: Once | RESPIRATORY_TRACT | Status: AC
  Administered 2017-03-16: 3 mL via RESPIRATORY_TRACT

## 2017-03-16 MED ORDER — BENZONATATE 100 MG PO CAPS: ORAL_CAPSULE | ORAL | 1 refills | Status: DC

## 2017-03-16 MED ORDER — PREDNISONE 10 MG PO TABS: ORAL_TABLET | ORAL | 0 refills | Status: DC

## 2017-03-16 MED ORDER — AMOXICILLIN 875 MG PO TABS: 875.0000 mg | ORAL_TABLET | Freq: Two times a day (BID) | ORAL | 0 refills | Status: DC

## 2017-03-16 MED ORDER — METHYLPREDNISOLONE ACETATE 40 MG/ML IJ SUSP
40.0000 mg | Freq: Once | INTRAMUSCULAR | Status: AC
  Administered 2017-03-16: 40 mg via INTRAMUSCULAR

## 2017-03-16 NOTE — Assessment & Plan Note (Signed)
Given Duoneb In office, improved rhonchal spasm and good air movement.  We will continue with nebulized treatments at home 3 times a day.  She was given Depo-Medrol 40 mg in the office prednisone taper will be given.  Also given amoxicillin to cover the sinusitis continue with her Flonase.  I have given her Tessalon Perles for cough which tends to work for her.  Her oxygen saturation looks good.

## 2017-03-16 NOTE — Patient Instructions (Addendum)
Take antibiotics Start steroids tomorrow Continue nasal spray Use nebulizer machine three times a day for next 2-3 days, then as needed  F/U as previous

## 2017-03-16 NOTE — Progress Notes (Signed)
   Subjective:    Patient ID: Joyce Gilmore, female    DOB: 09-01-1934, 83 y.o.   MRN: [id number]  Patient presents for Illness (productive cough, nasal drainage, HA, sinus pressure, sore throat)   Cough with prodiuction, sinus drainage for past 4 days, sore throat, not sleeping well. No cough medication. Wheezing a lot past couple of days Given neb treatment yesterday morning which helps some.  She has not had any fever Daughter has  been sick recently. See note from last week, recently returned from [place]   Review Of Systems:  GEN- denies fatigue, fever, weight loss,weakness, recent illness HEENT- denies eye drainage, change in vision,+ nasal discharge, CVS- denies chest pain, palpitations RESP- denies SOB,+ cough, +wheeze ABD- denies N/V, change in stools, abd pain GU- denies dysuria, hematuria, dribbling, incontinence MSK- denies joint pain, muscle aches, injury Neuro- denies headache, dizziness, syncope, seizure activity       Objective:    BP 122/72   Pulse 82   Temp 98.6 F (37 C) (Oral)   Resp (!) 22   Ht 4\' 8"  (1.422 m)   Wt 158 lb (71.7 kg)   SpO2 97%   BMI 35.42 kg/m  GEN- NAD, alert and oriented x3 HEENT- PERRL, EOMI, non injected sclera, pink conjunctiva, MMM, oropharynx clear , TM clear bilat no effusion,  + maxillary sinus tenderness,clear,  Nasal drainage  Neck- Supple, no LAD CVS- RRR, no murmur RESP-scattered wheeze, audible, normal WOB, rhonchi bilat  EXT- No edema Pulses- Radial 2+          Assessment & Plan:      Problem List Items Addressed This Visit      Unprioritized   Asthmatic bronchitis    Given Duoneb In office, improved rhonchal spasm and good air movement.  We will continue with nebulized treatments at home 3 times a day.  She was given Depo-Medrol 40 mg in the office prednisone taper will be given.  Also given amoxicillin to cover the sinusitis continue with her Flonase.  I have given her Tessalon Perles for cough which tends to  work for her.  Her oxygen saturation looks good.      Relevant Medications   predniSONE (DELTASONE) 10 MG tablet   methylPREDNISolone acetate (DEPO-MEDROL) injection 40 mg (Completed)   ipratropium-albuterol (DUONEB) 0.5-2.5 (3) MG/3ML nebulizer solution 3 mL (Completed)    Other Visit Diagnoses    Acute non-recurrent maxillary sinusitis    -  Primary   Relevant Medications   predniSONE (DELTASONE) 10 MG tablet   amoxicillin (AMOXIL) 875 MG tablet   benzonatate (TESSALON) 100 MG capsule   methylPREDNISolone acetate (DEPO-MEDROL) injection 40 mg (Completed)   ipratropium-albuterol (DUONEB) 0.5-2.5 (3) MG/3ML nebulizer solution 3 mL (Completed)      Note: This dictation was prepared with Dragon dictation along with smaller phrase technology. Any transcriptional errors that result from this process are unintentional.

## 2017-03-23 ENCOUNTER — Other Ambulatory Visit (INDEPENDENT_AMBULATORY_CARE_PROVIDER_SITE_OTHER): Payer: Self-pay

## 2017-03-23 DIAGNOSIS — M25551 Pain in right hip: Secondary | ICD-10-CM

## 2017-04-06 ENCOUNTER — Ambulatory Visit: Payer: Medicaid Other | Attending: Orthopaedic Surgery | Admitting: Physical Therapy

## 2017-04-06 ENCOUNTER — Encounter: Payer: Self-pay | Admitting: Physical Therapy

## 2017-04-06 DIAGNOSIS — R2689 Other abnormalities of gait and mobility: Secondary | ICD-10-CM | POA: Insufficient documentation

## 2017-04-06 DIAGNOSIS — M6281 Muscle weakness (generalized): Secondary | ICD-10-CM

## 2017-04-06 DIAGNOSIS — M25551 Pain in right hip: Secondary | ICD-10-CM | POA: Insufficient documentation

## 2017-04-06 NOTE — Therapy (Signed)
[place], [place], [address] Phone: [phone]   Fax:  [phone]  Physical Therapy Evaluation  Patient Details  Name: Joyce Gilmore MRN: [id number] Date of Birth: [birth year] Referring Provider: Zollie Beckers Md   Encounter Date: 04/06/2017  PT End of Session - 04/06/17 1144    Visit Number  1    Number of Visits  13    Date for PT Re-Evaluation  06/01/17    Authorization Type  MCD (reassess and submit at 4th visit )    PT Start Time  1101    PT Stop Time  1144    PT Time Calculation (min)  43 min    Activity Tolerance  Patient tolerated treatment well    Behavior During Therapy  [facility] for tasks assessed/performed       Past Medical History:  Diagnosis Date  . Arthritis   . CHF (congestive heart failure) (HCC)    diastolic  . Chronic kidney disease    hx of kidney stones,   . Depression    hx of   . Diabetes mellitus without complication ([facility] [place])   . Ganglion, left ankle and foot   . Hearing loss of both ears   . Hypertension   . MRSA infection    Oct 13 - Nov 13  . Osteoporosis   . Tinnitus of both ears     Past Surgical History:  Procedure Laterality Date  . BACK SURGERY     history restored after error with record merge      . INCISION AND DRAINAGE HIP  12/22/2011   Procedure: IRRIGATION AND DEBRIDEMENT HIP;  Surgeon: Mcarthur Rossetti, MD;  Location: [facility];  Service: Orthopedics;  Laterality: Right;  Irrigation and debridement right hip  . INCISION AND DRAINAGE HIP  12/27/2011   Procedure: IRRIGATION AND DEBRIDEMENT HIP;  Surgeon: Mcarthur Rossetti, MD;  Location: [facility];  Service: Orthopedics;  Laterality: Right;  Repeat irrigation and debridement  . JOINT REPLACEMENT     bilateral knee, right hip   . TOTAL HIP REVISION  12/05/2011   Procedure: TOTAL HIP REVISION;  Surgeon: Mcarthur Rossetti, MD;  [facility];  Service: Orthopedics;  Laterality: Right;  Right Hip  Revision Arthroplasty to Total Hip, Excision of Old Implant    There were no vitals filed for this visit.   Subjective Assessment - 04/06/17 1110    Subjective  pt is a 47 F with CC of R hip pain that has been going for about a month with no specific onset. pt has hx of THA on the R and revision in 2013. pain stays in the outside of the hip and denies any referral down the leg. the pain is worse at night and with walking/ standing. she reports getting an injection in the hip when she saw her MD about a month ago.  Since onset the pain seems to have improved.    Limitations  Standing;Walking    How long can you sit comfortably?  60 min    How long can you stand comfortably?  60 min    How long can you walk comfortably?  30 min    Diagnostic tests  x-ray for low back and r hip     Patient Stated Goals  to relieve pain, to walk for longer     Currently in Pain?  Yes    Pain Score  0-No pain At worst pt has difficulty reporting  Pain Location  Hip    Pain Orientation  Right    Pain Descriptors / Indicators  Stabbing    Pain Type  Chronic pain    Pain Frequency  Intermittent    Aggravating Factors   walking / standing for long periods of time, laying on the R side, activity    Pain Relieving Factors  ice,          [facility] PT Assessment - 04/06/17 1115      Assessment   Medical Diagnosis  R hip pain    Referring Provider  Zollie Beckers Md    Onset Date/Surgical Date  -- 1 month ago    Hand Dominance  Right    Next MD Visit  -- next week    Prior Therapy  no      Precautions   Precautions  None      Restrictions   Weight Bearing Restrictions  No      Balance Screen   Has the patient fallen in the past 6 months  No    Has the patient had a decrease in activity level because of a fear of falling?   No    Is the patient reluctant to leave their home because of a fear of falling?   No      Home Film/video editor residence    Living Arrangements   Children;Other relatives    Available Help at Discharge  Family;Available PRN/intermittently    Type of Home  House    Home Access  Stairs to enter    Entrance Stairs-Number of Steps  4    Entrance Stairs-Rails  Can reach both    Home Layout  One level    [place] - single point      Prior Function   Level of Independence  Independent    Vocation  Unemployed      Cognition   Overall Cognitive Status  Within Functional Limits for tasks assessed      Posture/Postural Control   Posture/Postural Control  Postural limitations    Postural Limitations  Rounded Shoulders;Forward head      ROM / Strength   AROM / PROM / Strength  AROM;PROM;Strength      AROM   Overall AROM   Within functional limits for tasks performed    AROM Assessment Site  Hip    Right/Left Hip  Right;Left      Strength   Strength Assessment Site  Hip    Right/Left Hip  Right;Left    Right Hip Flexion  4-/5    Right Hip ABduction  3+/5    Right Hip ADduction  4-/5    Left Hip Flexion  4-/5    Left Hip ABduction  4-/5    Left Hip ADduction  4-/5      Palpation   Palpation comment  TTP along the R glute medius      Ambulation/Gait   Ambulation/Gait  Yes    Assistive device  Straight cane    Gait Pattern  Step-through pattern;Decreased stride length;Trendelenburg;Antalgic             Objective measurements completed on examination: See above findings.                PT Short Term Goals - 04/06/17 1150      PT SHORT TERM GOAL #1   Title  pt to be I with inital HEP    Baseline  no previous HEP    Time  3    Period  Weeks    Status  New    Target Date  04/27/17      PT SHORT TERM GOAL #2   Title  pt to verbalize/ demo proper posture and DME to prevent and reduce hip / back pain and promote stability with gait    Baseline  unaware of posture and uses SPC on same side of involved hip    Time  3    Period  Weeks    Status  New    Target Date  04/27/17        PT  Long Term Goals - 04/06/17 1151      PT LONG TERM GOAL #1   Title  Increase  hip abductor/ extensor strength strength to >/= 4+/5 in all planes to promote hip stability with walking/ standing acitivities     Baseline  r hip abductors 3/5, L hip abductors 4-/5, bil hip extensors 3+/5,     Time  4    Period  Weeks    Status  New    Target Date  05/18/17      PT LONG TERM GOAL #2   Title  pt to be able to stand and walk >/= 60 min with LRAD reporting </= 1/10 pain for functional mobility required for ADLs and community ambulation    Baseline  able to stand for 60 min reporting significant pain, walk for 30 min with report of significant pain (pt was unable to report pain level, but described it as stabbing)    Time  6    Period  Weeks    Status  New    Target Date  05/18/17      PT LONG TERM GOAL #3   Title  pt to be I with all HEP given as of last visit to maintain and promote current level of function     Baseline  no previous HEP    Time  6    Period  Weeks    Status  New    Target Date  05/18/17             Plan - 04/06/17 1144    Clinical Impression Statement  pt presents to [facility] with CC of R hip pain starting about a month ago with no specific MOI, and reprots pain is worse with prolonged standing/ walking. bil hip mobility is [facility] but she demonstrates weakness in bil hips with R>L especially in hip abductors/ extensors. TTP noted along glute medius and max to the greater trochanter. she would benefit from physical therapy to Reduce hip pain, improve gait pattern/ endurance, reduce pain, improve hip strength to return pt to PLOF by addressing deficits listed.     History and Personal Factors relevant to plan of care:  PSH of R total hip and revision, osteoporsis    Clinical Presentation  Stable    Clinical Decision Making  Low due to improving of condition    Rehab Potential  Good    PT Frequency  1x / week    PT Duration  3 weeks progressing to 1 x a week for 6 weeks  following medicaid re-submission    PT Treatment/Interventions  ADLs/Self Care Home Management;Cryotherapy;Electrical Stimulation;Iontophoresis 4mg /ml Dexamethasone;Moist Heat;Ultrasound;Gait training;Stair training;Therapeutic activities;Therapeutic exercise;Manual techniques;Taping;Dry needling;Passive range of motion;Patient/family education;DME Instruction    PT Next Visit Plan  review/ update HEP, soft tissue work for glute med, stretching, strengthening of  hip, gait training with [place]    PT Home Exercise Plan  single knee to chest, clams, bridge and seated marching,     Consulted and Agree with Plan of Care  Patient;Family member/caregiver    Family Member Consulted  daughter       Patient will benefit from skilled therapeutic intervention in order to improve the following deficits and impairments:  Abnormal gait, Decreased endurance, Decreased activity tolerance, Improper body mechanics, Postural dysfunction, Decreased safety awareness, Decreased knowledge of use of DME, Decreased balance, Decreased strength  Visit Diagnosis: Pain in right hip  Muscle weakness (generalized)  Other abnormalities of gait and mobility     Problem List Patient Active Problem List   Diagnosis Date Noted  . GERD (gastroesophageal reflux disease) 03/10/2017  . Uses hearing aid 09/12/2016  . Asthmatic bronchitis 03/11/2016  . Chronic diastolic heart failure ([place]) 10/12/2015  . Shortness of breath 10/12/2015  . Seasonal allergies 11/21/2014  . DDD (degenerative disc disease), lumbar 10/11/2014  . H/O compression fracture of spine 10/11/2014  . Pulmonary hypertension ([place])   . Hepatic cirrhosis ([place]) 09/24/2014  . SIADH (syndrome of inappropriate ADH production) ([place]) 06/13/2014  . Insomnia 12/28/2013  . Anemia 07/06/2013  . HLD (hyperlipidemia) 06/27/2013  . Osteoporosis 07/26/2012  . Hyponatremia 05/16/2011  . Type 2 diabetes mellitus with diabetic chronic kidney disease ([place]) 08/17/2006  .  Essential hypertension 08/17/2006  . DIVERTICULOSIS, COLON 08/17/2006   Starr Lake PT, DPT, LAT, ATC  04/06/17  12:49 PM      [facility] [address] [place], [place], [address] Phone: [phone]   Fax:  [phone]  Name: Joyce Gilmore MRN: [id number] Date of Birth: 03/24/1934

## 2017-04-08 ENCOUNTER — Ambulatory Visit (INDEPENDENT_AMBULATORY_CARE_PROVIDER_SITE_OTHER): Payer: Medicaid Other | Admitting: Orthopaedic Surgery

## 2017-04-17 ENCOUNTER — Encounter: Payer: Self-pay | Admitting: Family Medicine

## 2017-04-17 ENCOUNTER — Ambulatory Visit: Payer: Medicaid Other | Admitting: Family Medicine

## 2017-04-17 ENCOUNTER — Other Ambulatory Visit: Payer: Self-pay

## 2017-04-17 VITALS — BP 124/76 | HR 60 | Temp 98.2°F | Resp 18 | Ht <= 58 in | Wt 157.0 lb

## 2017-04-17 DIAGNOSIS — Z794 Long term (current) use of insulin: Secondary | ICD-10-CM

## 2017-04-17 DIAGNOSIS — E1122 Type 2 diabetes mellitus with diabetic chronic kidney disease: Secondary | ICD-10-CM

## 2017-04-17 DIAGNOSIS — E7849 Other hyperlipidemia: Secondary | ICD-10-CM

## 2017-04-17 DIAGNOSIS — I5032 Chronic diastolic (congestive) heart failure: Secondary | ICD-10-CM | POA: Diagnosis not present

## 2017-04-17 DIAGNOSIS — N182 Chronic kidney disease, stage 2 (mild): Secondary | ICD-10-CM

## 2017-04-17 LAB — HEMOGLOBIN A1C, FINGERSTICK: Hgb A1C (fingerstick): 7.7 % OF TOTAL HGB — ABNORMAL HIGH (ref <6.0)

## 2017-04-17 NOTE — Assessment & Plan Note (Signed)
On statin drug, will try to get complete fasting panel at next visit

## 2017-04-17 NOTE — Progress Notes (Signed)
   Subjective:    Patient ID: Joyce Gilmore, female    DOB: 04-Mar-1934, 83 y.o.   MRN: [id number]  Patient presents for Follow-up (is not fasting)  Here to follow-up routine medical problems.  She was seen twice in January secondary to illness Diabetes mellitus her last A1c-7.4%.  Goal to keep her A1c less than 8% without hypoglycemia  CBG- 30 days 137, last 7 days 121, no hypoglycemia symptoms   Diastolic heart failure she has been maintaining her weight.  The upper 150s.  She has not had any shortness of breath.  She does have her Lasix.   Chronic mild hyponatremia her last sodium was normal at 135 BUN and creatinine also at goal  Doing well today her , no further cough or congestion   Review Of Systems:  GEN- denies fatigue, fever, weight loss,weakness, recent illness HEENT- denies eye drainage, change in vision, nasal discharge, CVS- denies chest pain, palpitations RESP- denies SOB, cough, wheeze ABD- denies N/V, change in stools, abd pain GU- denies dysuria, hematuria, dribbling, incontinence MSK- denies joint pain, muscle aches, injury Neuro- denies headache, dizziness, syncope, seizure activity       Objective:    BP 124/76   Pulse 60   Temp 98.2 F (36.8 C) (Oral)   Resp 18   Ht 4\' 8"  (1.422 m)   Wt 157 lb (71.2 kg)   SpO2 97%   BMI 35.20 kg/m  GEN- NAD, alert and oriented x3 HEENT- PERRL, EOMI, non injected sclera, pink conjunctiva, MMM, oropharynx clear Neck- Supple, no LAD  CVS- RRR, no murmur RESP-CTAB ABD-NABS,soft,NT,ND  EXT- No edema Pulses- Radial 2+        Assessment & Plan:      Problem List Items Addressed This Visit      Unprioritized   Type 2 diabetes mellitus with diabetic chronic kidney disease ([place]) - Primary    Goal a1c < 8%, today 7.7% Continue Lantus 38 units, Novolog 6 units with meals  On ACE/STATIN/ restart ASA      Relevant Orders   Hemoglobin A1C, fingerstick   HLD (hyperlipidemia)    On statin drug, will try to get  complete fasting panel at next visit       Chronic diastolic heart failure (HCC)    Euvolemic , no change to diuretic          Note: This dictation was prepared with Dragon dictation along with smaller phrase technology. Any transcriptional errors that result from this process are unintentional.

## 2017-04-17 NOTE — Assessment & Plan Note (Signed)
Euvolemic , no change to diuretic

## 2017-04-17 NOTE — Assessment & Plan Note (Addendum)
Goal a1c < 8%, today 7.7% Continue Lantus 38 units, Novolog 6 units with meals  On ACE/STATIN/ restart ASA

## 2017-04-17 NOTE — Patient Instructions (Addendum)
Okay to restart the aspirin  F/U end of April

## 2017-04-21 ENCOUNTER — Encounter: Payer: Self-pay | Admitting: Physical Therapy

## 2017-04-21 ENCOUNTER — Ambulatory Visit: Payer: Medicaid Other | Admitting: Physical Therapy

## 2017-04-21 DIAGNOSIS — M25551 Pain in right hip: Secondary | ICD-10-CM

## 2017-04-21 DIAGNOSIS — R2689 Other abnormalities of gait and mobility: Secondary | ICD-10-CM

## 2017-04-21 DIAGNOSIS — M6281 Muscle weakness (generalized): Secondary | ICD-10-CM

## 2017-04-21 NOTE — Therapy (Signed)
[place], [place], [address] Phone: [phone]   Fax:  [phone]  Physical Therapy Treatment  Patient Details  Name: Joyce Gilmore MRN: [id number] Date of Birth: 09-20-1934 Referring Provider: Zollie Beckers Md   Encounter Date: 04/21/2017  PT End of Session - 04/21/17 1011    Visit Number  2    Number of Visits  13    Date for PT Re-Evaluation  06/01/17    Authorization Type  MCD (reassess and submit at 4th visit )    PT Start Time  0931    PT Stop Time  1011    PT Time Calculation (min)  40 min    Activity Tolerance  Patient tolerated treatment well;No increased pain    Behavior During Therapy  WFL for tasks assessed/performed       Past Medical History:  Diagnosis Date  . Arthritis   . CHF (congestive heart failure) (HCC)    diastolic  . Chronic kidney disease    hx of kidney stones,   . Depression    hx of   . Diabetes mellitus without complication ([place])   . Ganglion, left ankle and foot   . Hearing loss of both ears   . Hypertension   . MRSA infection    Oct 13 - Nov 13  . Osteoporosis   . Tinnitus of both ears     Past Surgical History:  Procedure Laterality Date  . BACK SURGERY     history restored after error with record merge      . INCISION AND DRAINAGE HIP  12/22/2011   Procedure: IRRIGATION AND DEBRIDEMENT HIP;  Surgeon: Mcarthur Rossetti, MD;  Location: [facility];  Service: Orthopedics;  Laterality: Right;  Irrigation and debridement right hip  . INCISION AND DRAINAGE HIP  12/27/2011   Procedure: IRRIGATION AND DEBRIDEMENT HIP;  Surgeon: Mcarthur Rossetti, MD;  Location: [facility];  Service: Orthopedics;  Laterality: Right;  Repeat irrigation and debridement  . JOINT REPLACEMENT     bilateral knee, right hip   . TOTAL HIP REVISION  12/05/2011   Procedure: TOTAL HIP REVISION;  Surgeon: Mcarthur Rossetti, MD;  Location: [facility];  Service: Orthopedics;  Laterality:  Right;  Right Hip Revision Arthroplasty to Total Hip, Excision of Old Implant    There were no vitals filed for this visit.  Subjective Assessment - 04/21/17 0936    Subjective  Pt reports things are going well. She has no pain currently. She has been completing her HEP without any issues.     Limitations  Standing;Walking    How long can you sit comfortably?  60 min    How long can you stand comfortably?  60 min    How long can you walk comfortably?  30 min    Diagnostic tests  x-ray for low back and r hip     Patient Stated Goals  to relieve pain, to walk for longer     Currently in Pain?  No/denies                      [facility] Adult PT Treatment/Exercise - 04/21/17 0001      Exercises   Exercises  Knee/Hip      Knee/Hip Exercises: Stretches   Piriformis Stretch  Right;2 reps;30 seconds    Piriformis Stretch Limitations  supine figure 4      Knee/Hip Exercises: Aerobic   Nustep  L5  x5 min, Pt present to discuss session      Knee/Hip Exercises: Supine   Hip Adduction Isometric  Both;1 set;15 reps    Hip Adduction Isometric Limitations  3 sec hold, hooklying position    Bridges  1 set;10 reps;Limitations    Bridges Limitations  cues to prevent hip adduction    Straight Leg Raises  Right;2 sets;10 reps    Other Supine Knee/Hip Exercises  Rt bent knee fallout stretch 5x15 sec      Knee/Hip Exercises: Sidelying   Hip ABduction Limitations  attempted, but pt unable to complete without assistance    Clams  RLE only, 2x10 reps with green TB, decreased to red TB 2nd set due to muscle fatigue      Manual Therapy   Manual Therapy  Soft tissue mobilization;Myofascial release    Soft tissue mobilization  Rt vastus lateralis    Myofascial Release  TPR Rt glute med             PT Education - 04/21/17 1009    Education provided  Yes    Education Details  technique with therex    Person(s) Educated  Patient    Methods  Explanation;Handout;Verbal cues;Tactile  cues via language interpreter    Comprehension  Returned demonstration;Verbalized understanding       PT Short Term Goals - 04/21/17 1015      PT SHORT TERM GOAL #1   Title  pt to be I with inital HEP    Time  3    Period  Weeks    Status  Achieved      PT SHORT TERM GOAL #2   Title  pt to verbalize/ demo proper posture and DME to prevent and reduce hip / back pain and promote stability with gait    Baseline  unaware of posture and uses SPC on same side of involved hip    Time  3    Period  Weeks    Status  On-going        PT Long Term Goals - 04/06/17 1151      PT LONG TERM GOAL #1   Title  Increase  hip abductor/ extensor strength strength to >/= 4+/5 in all planes to promote hip stability with walking/ standing acitivities     Baseline  r hip abductors 3/5, L hip abductors 4-/5, bil hip extensors 3+/5,     Time  4    Period  Weeks    Status  New    Target Date  05/18/17      PT LONG TERM GOAL #2   Title  pt to be able to stand and walk >/= 60 min with LRAD reporting </= 1/10 pain for functional mobility required for ADLs and community ambulation    Baseline  able to stand for 60 min reporting significant pain, walk for 30 min with report of significant pain (pt was unable to report pain level, but described it as stabbing)    Time  6    Period  Weeks    Status  New    Target Date  05/18/17      PT LONG TERM GOAL #3   Title  pt to be I with all HEP given as of last visit to maintain and promote current level of function     Baseline  no previous HEP    Time  6    Period  Weeks    Status  New  Target Date  05/18/17            Plan - 04/21/17 1011    Clinical Impression Statement  Pt arrived reporting consistent HEP adherence since her evaluation 2 weeks ago. She has no pain upon arrival and session focused on gentle progressions of Rt hip flexibility and strengthening. Therapist made a couple of adjustments to pt's technique with therex and attempted  sidelying hip abduction, however pt was unable to complete at this time due to remaining limitations in hip strength. Ended session without any reports of pain. Will continue with current POC.     Rehab Potential  Good    PT Frequency  1x / week    PT Duration  3 weeks progressing to 1 x a week for 6 weeks following medicaid re-submission    PT Treatment/Interventions  ADLs/Self Care Home Management;Cryotherapy;Electrical Stimulation;Iontophoresis 4mg /ml Dexamethasone;Moist Heat;Ultrasound;Gait training;Stair training;Therapeutic activities;Therapeutic exercise;Manual techniques;Taping;Dry needling;Passive range of motion;Patient/family education;DME Instruction    PT Next Visit Plan  update HEP next visit, soft tissue work for glute med as needed, stretching, strengthening of hip, gait training with SPC    PT Home Exercise Plan  single knee to chest, clams, bridge and seated marching,     Consulted and Agree with Plan of Care  Patient;Family member/caregiver    Family Member Consulted  daughter       Patient will benefit from skilled therapeutic intervention in order to improve the following deficits and impairments:  Abnormal gait, Decreased endurance, Decreased activity tolerance, Improper body mechanics, Postural dysfunction, Decreased safety awareness, Decreased knowledge of use of DME, Decreased balance, Decreased strength  Visit Diagnosis: Pain in right hip  Muscle weakness (generalized)  Other abnormalities of gait and mobility     Problem List Patient Active Problem List   Diagnosis Date Noted  . GERD (gastroesophageal reflux disease) 03/10/2017  . Uses hearing aid 09/12/2016  . Asthmatic bronchitis 03/11/2016  . Chronic diastolic heart failure ([place]) 10/12/2015  . Shortness of breath 10/12/2015  . Seasonal allergies 11/21/2014  . DDD (degenerative disc disease), lumbar 10/11/2014  . H/O compression fracture of spine 10/11/2014  . Pulmonary hypertension ([place])   . Hepatic  cirrhosis ([place]) 09/24/2014  . SIADH (syndrome of inappropriate ADH production) ([place]) 06/13/2014  . Insomnia 12/28/2013  . Anemia 07/06/2013  . HLD (hyperlipidemia) 06/27/2013  . Osteoporosis 07/26/2012  . Hyponatremia 05/16/2011  . Type 2 diabetes mellitus with diabetic chronic kidney disease ([place]) 08/17/2006  . Essential hypertension 08/17/2006  . DIVERTICULOSIS, COLON 08/17/2006    10:16 AM,04/21/17 Sherol Dade PT, DPT [facility]  [facility] [address] [place], [place], [address] Phone: [phone]   Fax:  [phone]  Name: Joyce Gilmore MRN: [id number] Date of Birth: 06/14/1934

## 2017-04-28 ENCOUNTER — Ambulatory Visit: Payer: Medicaid Other | Attending: Orthopaedic Surgery | Admitting: Physical Therapy

## 2017-04-28 ENCOUNTER — Encounter: Payer: Self-pay | Admitting: Physical Therapy

## 2017-04-28 DIAGNOSIS — M6281 Muscle weakness (generalized): Secondary | ICD-10-CM | POA: Diagnosis present

## 2017-04-28 DIAGNOSIS — R2689 Other abnormalities of gait and mobility: Secondary | ICD-10-CM | POA: Insufficient documentation

## 2017-04-28 DIAGNOSIS — M25551 Pain in right hip: Secondary | ICD-10-CM | POA: Insufficient documentation

## 2017-04-28 NOTE — Therapy (Signed)
[place], [place], [address] Phone: [phone]   Fax:  [phone]  Physical Therapy Treatment / Discharge Summary  Patient Details  Name: Joyce Gilmore MRN: [id number] Date of Birth: Nov 21, 1934 Referring Provider: Zollie Beckers Md   Encounter Date: 04/28/2017  PT End of Session - 04/28/17 0930    Visit Number  3    Number of Visits  13    Date for PT Re-Evaluation  06/01/17    Authorization Type  MCD (reassess and submit at 4th visit )    PT Start Time  0930    PT Stop Time  0955    PT Time Calculation (min)  25 min    Activity Tolerance  Patient tolerated treatment well    Behavior During Therapy  [facility] for tasks assessed/performed       Past Medical History:  Diagnosis Date  . Arthritis   . CHF (congestive heart failure) (HCC)    diastolic  . Chronic kidney disease    hx of kidney stones,   . Depression    hx of   . Diabetes mellitus without complication ([place])   . Ganglion, left ankle and foot   . Hearing loss of both ears   . Hypertension   . MRSA infection    Oct 13 - Nov 13  . Osteoporosis   . Tinnitus of both ears     Past Surgical History:  Procedure Laterality Date  . BACK SURGERY     history restored after error with record merge      . INCISION AND DRAINAGE HIP  12/22/2011   Procedure: IRRIGATION AND DEBRIDEMENT HIP;  Surgeon: Mcarthur Rossetti, MD;  Location: [facility];  Service: Orthopedics;  Laterality: Right;  Irrigation and debridement right hip  . INCISION AND DRAINAGE HIP  12/27/2011   Procedure: IRRIGATION AND DEBRIDEMENT HIP;  Surgeon: Mcarthur Rossetti, MD;  Location: [facility];  Service: Orthopedics;  Laterality: Right;  Repeat irrigation and debridement  . JOINT REPLACEMENT     bilateral knee, right hip   . TOTAL HIP REVISION  12/05/2011   Procedure: TOTAL HIP REVISION;  Surgeon: Mcarthur Rossetti, MD;  Location[facility];  Service: Orthopedics;  Laterality:  Right;  Right Hip Revision Arthroplasty to Total Hip, Excision of Old Implant    There were no vitals filed for this visit.  Subjective Assessment - 04/28/17 0928    Subjective  "doing the exercises every day, the pain has pretty much gone" pt reports pain seems to change to the L side    Currently in Pain?  No/denies    Pain Score  0-No pain    Pain Frequency  Intermittent    Aggravating Factors   NO    Pain Relieving Factors  ice,          [facility] PT Assessment - 04/28/17 0935      Strength   Right Hip Flexion  4/5    Right Hip ABduction  4-/5    Right Hip ADduction  4/5    Left Hip Flexion  4/5    Left Hip ABduction  4-/5    Left Hip ADduction  4/5                  [facility] Adult PT Treatment/Exercise - 04/28/17 0001      Knee/Hip Exercises: Seated   Marching  2 sets;15 reps cue sto avoid using hands to help lift legs  Sit to Sand  2 sets;10 reps;without UE support      Knee/Hip Exercises: Sidelying   Hip ABduction  2 sets;10 reps tactile cues/ verbal cues for proper form             PT Education - 04/28/17 0954    Education provided  Yes    Education Details  reviewed previously provided HEP and updated HEP to continue with progression of strength. benefits of doing these exercises forever to     [employer]) Educated  Patient;Caregiver(s)    Methods  Explanation;Handout;Verbal cues;Demonstration    Comprehension  Verbalized understanding;Verbal cues required;Returned demonstration       PT Short Term Goals - 04/28/17 0956      PT SHORT TERM GOAL #1   Title  pt to be I with inital HEP    Time  3    Period  Weeks    Status  Achieved      PT SHORT TERM GOAL #2   Title  pt to verbalize/ demo proper posture and DME to prevent and reduce hip / back pain and promote stability with gait    Period  Weeks    Status  Achieved        PT Long Term Goals - 04/28/17 0956      PT LONG TERM GOAL #1   Title  Increase  hip abductor/ extensor strength  strength to >/= 4+/5 in all planes to promote hip stability with walking/ standing acitivities     Time  4    Period  Weeks    Status  Partially Met      PT LONG TERM GOAL #2   Title  pt to be able to stand and walk >/= 60 min with LRAD reporting </= 1/10 pain for functional mobility required for ADLs and community ambulation    Time  6    Period  Weeks    Status  Achieved      PT LONG TERM GOAL #3   Title  pt to be I with all HEP given as of last visit to maintain and promote current level of function     Time  6    Period  Weeks    Status  Achieved            Plan - 04/28/17 0956    Clinical Impression Statement  pt reports she has been consistent with her HEP and has improved strenght compared to inital measures but continues to demo mild weakness which could be largely attributed to misunderstanding during testing. she reports she is pain free and has met or partially met all goals. She reports she feels confident that she is able to [facility] and progress her current level of function independently and will be discharged from PT today.     PT Next Visit Plan  D/C    PT Home Exercise Plan  single knee to chest, clams, bridge and seated marching, sidelying hip abduction, sit to stand    Consulted and Agree with Plan of Care  Patient       Patient will benefit from skilled therapeutic intervention in order to improve the following deficits and impairments:  Abnormal gait, Decreased endurance, Decreased activity tolerance, Improper body mechanics, Postural dysfunction, Decreased safety awareness, Decreased knowledge of use of DME, Decreased balance, Decreased strength  Visit Diagnosis: Pain in right hip  Muscle weakness (generalized)  Other abnormalities of gait and mobility     Problem List Patient Active  Problem List   Diagnosis Date Noted  . GERD (gastroesophageal reflux disease) 03/10/2017  . Uses hearing aid 09/12/2016  . Asthmatic bronchitis 03/11/2016  .  Chronic diastolic heart failure ([place]) 10/12/2015  . Shortness of breath 10/12/2015  . Seasonal allergies 11/21/2014  . DDD (degenerative disc disease), lumbar 10/11/2014  . H/O compression fracture of spine 10/11/2014  . Pulmonary hypertension ([place])   . Hepatic cirrhosis ([place]) 09/24/2014  . SIADH (syndrome of inappropriate ADH production) ([place]) 06/13/2014  . Insomnia 12/28/2013  . Anemia 07/06/2013  . HLD (hyperlipidemia) 06/27/2013  . Osteoporosis 07/26/2012  . Hyponatremia 05/16/2011  . Type 2 diabetes mellitus with diabetic chronic kidney disease ([place]) 08/17/2006  . Essential hypertension 08/17/2006  . DIVERTICULOSIS, COLON 08/17/2006   Starr Lake PT, DPT, LAT, ATC  04/28/17  9:59 AM      [facility] [address] [place], [place], [address] Phone: [phone]   Fax:  [phone]  Name: Joyce Gilmore MRN: [id number] Date of Birth: Sep 29, 1934      PHYSICAL THERAPY DISCHARGE SUMMARY  Visits from Start of Care: 3  Current functional level related to goals / functional outcomes: See goals   Remaining deficits: See above assessment   Education / Equipment: HEP, theraband, posture  Plan: Patient agrees to discharge.  Patient goals were partially met. Patient is being discharged due to being pleased with the current functional level.  ?????    Lorik Guo PT, DPT, LAT, ATC  04/28/17  10:00 AM

## 2017-05-05 ENCOUNTER — Encounter: Payer: Medicaid Other | Admitting: Physical Therapy

## 2017-05-27 ENCOUNTER — Other Ambulatory Visit: Payer: Self-pay | Admitting: Family Medicine

## 2017-05-27 DIAGNOSIS — Z1231 Encounter for screening mammogram for malignant neoplasm of breast: Secondary | ICD-10-CM

## 2017-06-11 ENCOUNTER — Ambulatory Visit: Payer: Medicaid Other | Admitting: Family Medicine

## 2017-06-11 ENCOUNTER — Other Ambulatory Visit: Payer: Self-pay

## 2017-06-11 ENCOUNTER — Encounter: Payer: Self-pay | Admitting: Family Medicine

## 2017-06-11 ENCOUNTER — Other Ambulatory Visit: Payer: Self-pay | Admitting: *Deleted

## 2017-06-11 VITALS — BP 128/82 | HR 68 | Temp 98.2°F | Resp 14 | Ht <= 58 in | Wt 158.0 lb

## 2017-06-11 DIAGNOSIS — E1122 Type 2 diabetes mellitus with diabetic chronic kidney disease: Secondary | ICD-10-CM

## 2017-06-11 DIAGNOSIS — I5032 Chronic diastolic (congestive) heart failure: Secondary | ICD-10-CM | POA: Diagnosis not present

## 2017-06-11 DIAGNOSIS — E7849 Other hyperlipidemia: Secondary | ICD-10-CM

## 2017-06-11 DIAGNOSIS — I10 Essential (primary) hypertension: Secondary | ICD-10-CM

## 2017-06-11 DIAGNOSIS — N182 Chronic kidney disease, stage 2 (mild): Secondary | ICD-10-CM | POA: Diagnosis not present

## 2017-06-11 DIAGNOSIS — Z794 Long term (current) use of insulin: Secondary | ICD-10-CM

## 2017-06-11 LAB — COMPREHENSIVE METABOLIC PANEL
AG Ratio: 1.8 (calc) (ref 1.0–2.5)
ALBUMIN MSPROF: 4.4 g/dL (ref 3.6–5.1)
ALT: 15 U/L (ref 6–29)
AST: 21 U/L (ref 10–35)
Alkaline phosphatase (APISO): 90 U/L (ref 33–130)
BUN/Creatinine Ratio: 22 (calc) (ref 6–22)
BUN: 22 mg/dL (ref 7–25)
CALCIUM: 9.7 mg/dL (ref 8.6–10.4)
CO2: 25 mmol/L (ref 20–32)
CREATININE: 1.02 mg/dL — AB (ref 0.60–0.88)
Chloride: 100 mmol/L (ref 98–110)
GLUCOSE: 106 mg/dL — AB (ref 65–99)
Globulin: 2.5 g/dL (calc) (ref 1.9–3.7)
POTASSIUM: 4.8 mmol/L (ref 3.5–5.3)
SODIUM: 135 mmol/L (ref 135–146)
TOTAL PROTEIN: 6.9 g/dL (ref 6.1–8.1)
Total Bilirubin: 0.4 mg/dL (ref 0.2–1.2)

## 2017-06-11 LAB — LIPID PANEL
CHOL/HDL RATIO: 2.7 (calc) (ref <5.0)
CHOLESTEROL: 159 mg/dL (ref <200)
HDL: 58 mg/dL (ref >50)
LDL CHOLESTEROL (CALC): 71 mg/dL
Non-HDL Cholesterol (Calc): 101 mg/dL (calc) (ref <130)
TRIGLYCERIDES: 209 mg/dL — AB (ref <150)

## 2017-06-11 LAB — CBC WITH DIFFERENTIAL/PLATELET
BASOS PCT: 0.9 %
Basophils Absolute: 74 cells/uL (ref 0–200)
EOS ABS: 262 {cells}/uL (ref 15–500)
Eosinophils Relative: 3.2 %
HCT: 36.9 % (ref 35.0–45.0)
Hemoglobin: 12.6 g/dL (ref 11.7–15.5)
Lymphs Abs: 1984 cells/uL (ref 850–3900)
MCH: 28.8 pg (ref 27.0–33.0)
MCHC: 34.1 g/dL (ref 32.0–36.0)
MCV: 84.2 fL (ref 80.0–100.0)
MPV: 10.8 fL (ref 7.5–12.5)
Monocytes Relative: 8.7 %
NEUTROS PCT: 63 %
Neutro Abs: 5166 cells/uL (ref 1500–7800)
PLATELETS: 202 10*3/uL (ref 140–400)
RBC: 4.38 10*6/uL (ref 3.80–5.10)
RDW: 13.8 % (ref 11.0–15.0)
TOTAL LYMPHOCYTE: 24.2 %
WBC: 8.2 10*3/uL (ref 3.8–10.8)
WBCMIX: 713 {cells}/uL (ref 200–950)

## 2017-06-11 MED ORDER — SUCRALFATE 1 G PO TABS: 1.0000 g | ORAL_TABLET | Freq: Four times a day (QID) | ORAL | 3 refills | Status: DC

## 2017-06-11 MED ORDER — LISINOPRIL 10 MG PO TABS: 10.0000 mg | ORAL_TABLET | Freq: Every day | ORAL | 3 refills | Status: DC

## 2017-06-11 MED ORDER — FLUTICASONE PROPIONATE 50 MCG/ACT NA SUSP: 2.0000 | Freq: Every day | NASAL | 1 refills | Status: DC

## 2017-06-11 MED ORDER — RANITIDINE HCL 150 MG PO TABS: 150.0000 mg | ORAL_TABLET | Freq: Two times a day (BID) | ORAL | 0 refills | Status: DC

## 2017-06-11 MED ORDER — AMLODIPINE BESYLATE 5 MG PO TABS: 5.0000 mg | ORAL_TABLET | Freq: Every day | ORAL | 3 refills | Status: DC

## 2017-06-11 MED ORDER — ALBUTEROL SULFATE (2.5 MG/3ML) 0.083% IN NEBU: 2.5000 mg | INHALATION_SOLUTION | Freq: Four times a day (QID) | RESPIRATORY_TRACT | 1 refills | Status: DC | PRN

## 2017-06-11 MED ORDER — INSULIN ASPART 100 UNIT/ML FLEXPEN: PEN_INJECTOR | SUBCUTANEOUS | 3 refills | Status: DC

## 2017-06-11 MED ORDER — BENZONATATE 100 MG PO CAPS: ORAL_CAPSULE | ORAL | 1 refills | Status: DC

## 2017-06-11 MED ORDER — INSULIN PEN NEEDLE 31G X 8 MM MISC: 11 refills | Status: DC

## 2017-06-11 MED ORDER — ACCU-CHEK SOFTCLIX LANCETS MISC: 11 refills | Status: DC

## 2017-06-11 MED ORDER — FUROSEMIDE 20 MG PO TABS: 20.0000 mg | ORAL_TABLET | Freq: Every day | ORAL | 3 refills | Status: DC

## 2017-06-11 MED ORDER — PRAVASTATIN SODIUM 20 MG PO TABS: 20.0000 mg | ORAL_TABLET | Freq: Every day | ORAL | 3 refills | Status: DC

## 2017-06-11 MED ORDER — ALBUTEROL SULFATE HFA 108 (90 BASE) MCG/ACT IN AERS: 1.0000 | INHALATION_SPRAY | RESPIRATORY_TRACT | 3 refills | Status: DC | PRN

## 2017-06-11 MED ORDER — INSULIN GLARGINE 100 UNIT/ML SOLOSTAR PEN: 36.0000 [IU] | PEN_INJECTOR | Freq: Every day | SUBCUTANEOUS | 11 refills | Status: DC

## 2017-06-11 MED ORDER — GLUCOSE BLOOD VI STRP: ORAL_STRIP | 5 refills | Status: DC

## 2017-06-11 MED ORDER — LANSOPRAZOLE 30 MG PO CPDR: DELAYED_RELEASE_CAPSULE | ORAL | 3 refills | Status: DC

## 2017-06-11 NOTE — Progress Notes (Signed)
   Subjective:    Patient ID: Joyce Gilmore, female    DOB: 09/19/1934, 83 y.o.   MRN: [id number]  Patient presents for Follow-up (is not fasting)  Pt here to f/u chroni cmedical problems  Leaving April 25th back to [place] for a couple of months.  She does not have any specific concerns today.  She needs medication refilled before she goes.  Diabetes mellitus no hypoglycemic symptoms.  Her last A1c was 7.7% in February.  Her 30-day average is 119 for fastings.  Hypertension-  she is taking her medicine as prescribed.  CHF she has not had any fluid overload no shortness of breath no cough.     Review Of Systems:  GEN- denies fatigue, fever, weight loss,weakness, recent illness HEENT- denies eye drainage, change in vision, nasal discharge, CVS- denies chest pain, palpitations RESP- denies SOB, cough, wheeze ABD- denies N/V, change in stools, abd pain GU- denies dysuria, hematuria, dribbling, incontinence MSK- denies joint pain, muscle aches, injury Neuro- denies headache, dizziness, syncope, seizure activity       Objective:    BP 128/82   Pulse 68   Temp 98.2 F (36.8 C) (Oral)   Resp 14   Ht 4\' 8"  (1.422 m)   Wt 158 lb (71.7 kg)   SpO2 96%   BMI 35.42 kg/m  GEN- NAD, alert and oriented x3 HEENT- PERRL, EOMI, non injected sclera, pink conjunctiva, MMM, oropharynx clear Neck- Supple, no thyromegaly CVS- RRR, no murmur RESP-CTAB ABD-NABS,soft,NT,ND EXT- No edema Pulses- Radial, DP- 2+        Assessment & Plan:      Problem List Items Addressed This Visit      Unprioritized   HLD (hyperlipidemia) - Primary   Relevant Orders   Lipid panel   Type 2 diabetes mellitus with diabetic chronic kidney disease ([place])    farily well controlled for her age, no change to dosing       Essential hypertension    BP well controlled check lipids, renal function ,CBC      Chronic diastolic heart failure (HCC)    Compensated no change to diuretic      Relevant Orders    Comprehensive metabolic panel   CBC with Differential/Platelet      Note: This dictation was prepared with Dragon dictation along with smaller phrase technology. Any transcriptional errors that result from this process are unintentional.

## 2017-06-11 NOTE — Assessment & Plan Note (Signed)
Compensated no change to diuretic

## 2017-06-11 NOTE — Patient Instructions (Signed)
F/u August

## 2017-06-11 NOTE — Assessment & Plan Note (Signed)
farily well controlled for her age, no change to dosing

## 2017-06-11 NOTE — Assessment & Plan Note (Signed)
BP well controlled check lipids, renal function ,CBC

## 2017-06-15 ENCOUNTER — Telehealth (INDEPENDENT_AMBULATORY_CARE_PROVIDER_SITE_OTHER): Payer: Self-pay | Admitting: Orthopaedic Surgery

## 2017-06-15 NOTE — Telephone Encounter (Signed)
Patient request renewal of her handicap sticker.

## 2017-06-19 ENCOUNTER — Ambulatory Visit: Payer: Medicaid Other

## 2017-06-22 ENCOUNTER — Encounter: Payer: Self-pay | Admitting: Family Medicine

## 2017-06-23 ENCOUNTER — Ambulatory Visit: Payer: Medicaid Other | Admitting: Family Medicine

## 2017-07-10 ENCOUNTER — Encounter: Payer: Self-pay | Admitting: Internal Medicine

## 2017-10-12 ENCOUNTER — Ambulatory Visit (INDEPENDENT_AMBULATORY_CARE_PROVIDER_SITE_OTHER): Payer: Medicaid Other | Admitting: Family Medicine

## 2017-10-12 ENCOUNTER — Encounter: Payer: Self-pay | Admitting: Family Medicine

## 2017-10-12 ENCOUNTER — Other Ambulatory Visit: Payer: Self-pay

## 2017-10-12 VITALS — BP 126/80 | HR 72 | Temp 98.1°F | Resp 16 | Ht <= 58 in | Wt 156.0 lb

## 2017-10-12 DIAGNOSIS — E7849 Other hyperlipidemia: Secondary | ICD-10-CM

## 2017-10-12 DIAGNOSIS — Z794 Long term (current) use of insulin: Secondary | ICD-10-CM

## 2017-10-12 DIAGNOSIS — M5136 Other intervertebral disc degeneration, lumbar region: Secondary | ICD-10-CM | POA: Diagnosis not present

## 2017-10-12 DIAGNOSIS — K7469 Other cirrhosis of liver: Secondary | ICD-10-CM

## 2017-10-12 DIAGNOSIS — I10 Essential (primary) hypertension: Secondary | ICD-10-CM

## 2017-10-12 DIAGNOSIS — E1122 Type 2 diabetes mellitus with diabetic chronic kidney disease: Secondary | ICD-10-CM

## 2017-10-12 DIAGNOSIS — N182 Chronic kidney disease, stage 2 (mild): Secondary | ICD-10-CM | POA: Diagnosis not present

## 2017-10-12 DIAGNOSIS — B351 Tinea unguium: Secondary | ICD-10-CM

## 2017-10-12 MED ORDER — FLUTICASONE FUROATE 100 MCG/ACT IN AEPB: 1.0000 | INHALATION_SPRAY | Freq: Every day | RESPIRATORY_TRACT | 3 refills | Status: DC

## 2017-10-12 MED ORDER — TRAMADOL HCL 50 MG PO TABS: 50.0000 mg | ORAL_TABLET | Freq: Two times a day (BID) | ORAL | 1 refills | Status: DC | PRN

## 2017-10-12 MED ORDER — CICLOPIROX 8 % EX SOLN: Freq: Every day | CUTANEOUS | 2 refills | Status: DC

## 2017-10-12 MED ORDER — POLYVINYL ALCOHOL 1.4 % OP SOLN: 1.0000 [drp] | OPHTHALMIC | 3 refills | Status: DC | PRN

## 2017-10-12 MED ORDER — INSULIN GLARGINE 100 UNIT/ML SOLOSTAR PEN: 36.0000 [IU] | PEN_INJECTOR | Freq: Every day | SUBCUTANEOUS | 11 refills | Status: DC

## 2017-10-12 NOTE — Assessment & Plan Note (Signed)
She is not fasting so we will repeat her lipids at the next visit.  sHe is on statin drug

## 2017-10-12 NOTE — Patient Instructions (Addendum)
Use topical for nails Get the diabetic shoes - use the stores listed and prescription We will refill your medications Ultram for pain If this does not improve call Dr. Ninfa Linden- [phone] F/U 1st week December

## 2017-10-12 NOTE — Assessment & Plan Note (Signed)
Noted on imaging in the past but she has not had any ascites or complications from the cirrhosis noted on the imaging.  We will following her liver function test.

## 2017-10-12 NOTE — Assessment & Plan Note (Signed)
Blood pressure is controlled no changes to medicine

## 2017-10-12 NOTE — Assessment & Plan Note (Addendum)
Diabetes has been fairly well controlled for her age.  No change to the medication Corn and callus she would benefit from diabetic shoes prescription was given.  For the nail fungus topical would be best secondary to the liver side effects and her other medications.  She needs to be on her statin drug.  I have given her topical [place]

## 2017-10-12 NOTE — Assessment & Plan Note (Signed)
I do think that she is high risk for any surgical intervention.  We will place her on tramadol twice a day as needed for pain.  If this is not helpful can get her back in contact with her orthopedist which she was given number.

## 2017-10-12 NOTE — Progress Notes (Addendum)
Subjective:    Patient ID: Joyce Gilmore, female    DOB: 08/17/1934, 83 y.o.   MRN: [id number]  Patient presents for Follow-up (is not fasting)   Pt here with son and translator  Recently returned from [place].  She has been having some left hip and back pain she has known degenerative disc disease she has had x-rays of the left hip did not show any active disease.  She has some spondylolisthesis she has had right hip replacement.  She did do physical therapy earlier this year due to pain.  She states while she was in [place] the "Village doctor "gave her some type of infusion or shot for her pain which helped a little bit.  Otherwise she did take some anti-inflammatory.  She states that the pain will go down the side and back of her leg towards the back of her knee.  She denies any tingling or numbness in her feet but does have a sore on the top of her left foot from her shoes.  Diabetes mellitus her last A1c was 7.7% she is due for repeat A1c.  She is taking her Lantus 38 units and NovoLog 6 units with her meals.  Her meter shows that her 7-day average is one 2030-day average 122 she has not had any hypoglycemia symptoms   Hypertension she is taking her blood pressure medicine as prescribed without any difficulty  Urine foot examination is noted that her nails were completely yellow and brittle states it is been this way for months.  Does have to give a few times at night this is been going on for years but she is able to go back to sleep.  She denies any pain or burning sensation      Review Of Systems:  GEN- denies fatigue, fever, weight loss,weakness, recent illness HEENT- denies eye drainage, change in vision, nasal discharge, CVS- denies chest pain, palpitations RESP- denies SOB, cough, wheeze ABD- denies N/V, change in stools, abd pain GU- denies dysuria, hematuria, dribbling, incontinence MSK- + joint pain, muscle aches, injury Neuro- denies headache, dizziness, syncope, seizure  activity       Objective:    BP 126/80   Pulse 72   Temp 98.1 F (36.7 C) (Oral)   Resp 16   Ht 4\' 8"  (1.422 m)   Wt 156 lb (70.8 kg)   SpO2 98%   BMI 34.97 kg/m  GEN- NAD, alert and oriented x3 HEENT- PERRL, EOMI, non injected sclera, pink conjunctiva, MMM, oropharynx clear Neck- Supple, no thyromegaly CVS- RRR, no murmur RESP-CTAB ABD-NABS,soft,NT,ND MSK- Mild TTP left paraspinals to buttocks, neg SLR, fair ROM spine, Hips, no crepitus, NT of trochanter bilat, no effusion knees  EXT- No edema Pulses- Radial, DP- 2+  Left foot 3rd digit with corn Callus Nail fungus       Assessment & Plan:      Problem List Items Addressed This Visit      Unprioritized   DDD (degenerative disc disease), lumbar    I do think that she is high risk for any surgical intervention.  We will place her on tramadol twice a day as needed for pain.  If this is not helpful can get her back in contact with her orthopedist which she was given number.      Relevant Medications   traMADol (ULTRAM) 50 MG tablet   Essential hypertension    Blood pressure is controlled no changes to medicine      Hepatic cirrhosis (  [place])    Noted on imaging in the past but she has not had any ascites or complications from the cirrhosis noted on the imaging.  We will following her liver function test.      HLD (hyperlipidemia)    She is not fasting so we will repeat her lipids at the next visit.  sHe is on statin drug      Type 2 diabetes mellitus with diabetic chronic kidney disease ([place]) - Primary    Diabetes has been fairly well controlled for her age.  No change to the medication Corn and callus she would benefit from diabetic shoes prescription was given.  For the nail fungus topical would be best secondary to the liver side effects and her other medications.  She needs to be on her statin drug.  I have given her topical Penlac      Relevant Medications   Insulin Glargine (LANTUS SOLOSTAR) 100  UNIT/ML Solostar Pen   Other Relevant Orders   CBC with Differential/Platelet   Comprehensive metabolic panel   Hemoglobin A1c    Other Visit Diagnoses    Toenail fungus       Relevant Medications   ciclopirox (PENLAC) 8 % solution      Note: This dictation was prepared with Dragon dictation along with smaller phrase technology. Any transcriptional errors that result from this process are unintentional.

## 2017-10-13 LAB — CBC WITH DIFFERENTIAL/PLATELET
BASOS PCT: 0.7 %
Basophils Absolute: 57 cells/uL (ref 0–200)
EOS ABS: 172 {cells}/uL (ref 15–500)
Eosinophils Relative: 2.1 %
HEMATOCRIT: 40 % (ref 35.0–45.0)
HEMOGLOBIN: 13 g/dL (ref 11.7–15.5)
Lymphs Abs: 1829 cells/uL (ref 850–3900)
MCH: 27.3 pg (ref 27.0–33.0)
MCHC: 32.5 g/dL (ref 32.0–36.0)
MCV: 83.9 fL (ref 80.0–100.0)
MPV: 10.7 fL (ref 7.5–12.5)
Monocytes Relative: 7.9 %
Neutro Abs: 5494 cells/uL (ref 1500–7800)
Neutrophils Relative %: 67 %
PLATELETS: 243 10*3/uL (ref 140–400)
RBC: 4.77 10*6/uL (ref 3.80–5.10)
RDW: 16.3 % — ABNORMAL HIGH (ref 11.0–15.0)
TOTAL LYMPHOCYTE: 22.3 %
WBC: 8.2 10*3/uL (ref 3.8–10.8)
WBCMIX: 648 {cells}/uL (ref 200–950)

## 2017-10-13 LAB — COMPREHENSIVE METABOLIC PANEL
AG RATIO: 1.6 (calc) (ref 1.0–2.5)
ALKALINE PHOSPHATASE (APISO): 76 U/L (ref 33–130)
ALT: 13 U/L (ref 6–29)
AST: 17 U/L (ref 10–35)
Albumin: 4.2 g/dL (ref 3.6–5.1)
BILIRUBIN TOTAL: 0.3 mg/dL (ref 0.2–1.2)
BUN/Creatinine Ratio: 25 (calc) — ABNORMAL HIGH (ref 6–22)
BUN: 25 mg/dL (ref 7–25)
CALCIUM: 9.4 mg/dL (ref 8.6–10.4)
CHLORIDE: 99 mmol/L (ref 98–110)
CO2: 25 mmol/L (ref 20–32)
Creat: 1.01 mg/dL — ABNORMAL HIGH (ref 0.60–0.88)
GLOBULIN: 2.6 g/dL (ref 1.9–3.7)
Glucose, Bld: 129 mg/dL — ABNORMAL HIGH (ref 65–99)
Potassium: 4.9 mmol/L (ref 3.5–5.3)
Sodium: 133 mmol/L — ABNORMAL LOW (ref 135–146)
Total Protein: 6.8 g/dL (ref 6.1–8.1)

## 2017-10-13 LAB — HEMOGLOBIN A1C
EAG (MMOL/L): 11.2 (calc)
Hgb A1c MFr Bld: 8.7 % of total Hgb — ABNORMAL HIGH (ref <5.7)
MEAN PLASMA GLUCOSE: 203 (calc)

## 2017-10-18 ENCOUNTER — Emergency Department (HOSPITAL_COMMUNITY): Payer: Medicaid Other

## 2017-10-18 ENCOUNTER — Emergency Department (HOSPITAL_COMMUNITY)
Admission: EM | Admit: 2017-10-18 | Discharge: 2017-10-18 | Disposition: A | Discharge status: Home / Self Care | Payer: Medicaid Other | Attending: Emergency Medicine | Admitting: Emergency Medicine

## 2017-10-18 ENCOUNTER — Encounter (HOSPITAL_COMMUNITY): Payer: Self-pay | Admitting: Emergency Medicine

## 2017-10-18 ENCOUNTER — Other Ambulatory Visit: Payer: Self-pay

## 2017-10-18 DIAGNOSIS — M79605 Pain in left leg: Secondary | ICD-10-CM | POA: Diagnosis not present

## 2017-10-18 DIAGNOSIS — W19XXXA Unspecified fall, initial encounter: Secondary | ICD-10-CM | POA: Diagnosis not present

## 2017-10-18 DIAGNOSIS — I5032 Chronic diastolic (congestive) heart failure: Secondary | ICD-10-CM | POA: Diagnosis not present

## 2017-10-18 DIAGNOSIS — I130 Hypertensive heart and chronic kidney disease with heart failure and stage 1 through stage 4 chronic kidney disease, or unspecified chronic kidney disease: Secondary | ICD-10-CM | POA: Diagnosis not present

## 2017-10-18 DIAGNOSIS — G44319 Acute post-traumatic headache, not intractable: Secondary | ICD-10-CM | POA: Diagnosis not present

## 2017-10-18 DIAGNOSIS — E119 Type 2 diabetes mellitus without complications: Secondary | ICD-10-CM | POA: Insufficient documentation

## 2017-10-18 DIAGNOSIS — S199XXA Unspecified injury of neck, initial encounter: Secondary | ICD-10-CM | POA: Diagnosis not present

## 2017-10-18 DIAGNOSIS — N189 Chronic kidney disease, unspecified: Secondary | ICD-10-CM | POA: Diagnosis not present

## 2017-10-18 DIAGNOSIS — M542 Cervicalgia: Secondary | ICD-10-CM | POA: Diagnosis not present

## 2017-10-18 DIAGNOSIS — S0990XA Unspecified injury of head, initial encounter: Secondary | ICD-10-CM | POA: Diagnosis not present

## 2017-10-18 DIAGNOSIS — R112 Nausea with vomiting, unspecified: Secondary | ICD-10-CM | POA: Diagnosis not present

## 2017-10-18 DIAGNOSIS — R51 Headache: Secondary | ICD-10-CM | POA: Diagnosis not present

## 2017-10-18 DIAGNOSIS — Z79899 Other long term (current) drug therapy: Secondary | ICD-10-CM | POA: Insufficient documentation

## 2017-10-18 DIAGNOSIS — S79912A Unspecified injury of left hip, initial encounter: Secondary | ICD-10-CM | POA: Diagnosis not present

## 2017-10-18 MED ORDER — ACETAMINOPHEN 325 MG PO TABS
650.0000 mg | ORAL_TABLET | Freq: Once | ORAL | Status: AC
  Administered 2017-10-18: 650 mg via ORAL
  Filled 2017-10-18: qty 2

## 2017-10-18 NOTE — ED Notes (Signed)
Patient transported to CT 

## 2017-10-18 NOTE — ED Notes (Addendum)
Pt verbalizes understanding of d/c instructions. Pt taken to lobby in wheelchair at d/c with all belongings and with family.   

## 2017-10-18 NOTE — Discharge Instructions (Signed)
Continue home medications as previously prescribed to return to the emergency department if symptoms worsen.  May take Tylenol as needed for pain following dosing instructions on packaging

## 2017-10-18 NOTE — ED Triage Notes (Signed)
Pts primary language is spanish. Pt at [place] and tripped and fell and hit head. No LOC, no dizziness, no nausea. Able to bear weight and stand. C/o pain in left leg. 146/94, 68, resp 18, CBG 115

## 2017-10-18 NOTE — ED Notes (Signed)
ED Provider at bedside. 

## 2017-10-18 NOTE — ED Provider Notes (Signed)
[facility] EMERGENCY DEPARTMENT Provider Note   CSN: [id number] Arrival date & time: 10/18/17  1441   History   Chief Complaint Chief Complaint  Patient presents with  . Fall    HPI Joyce Gilmore is a 83 y.o. female.  HPI 83 year old female with history of CHF (EF 60-65%), CKD, diabetes, hypertension, & obesity who presents the emergency department today for evaluation after fall.  States she was walking through a restaurant when she tripped over something on the floor.  Did strike her head and has since had a headache and neck pain.  No numbness or weakness.  No loss of consciousness.  Does not take any anticoagulants.  No lacerations or abrasions.  Does have area swelling on her left forehead.  Also complaining of left hip pain.  Has been able to walk since the fall however does have pain in her left hip with walking.  Denies any other injuries.  No recent illnesses.  No syncope/presyncope.  No other pain at this time.  Past Medical History:  Diagnosis Date  . Arthritis   . CHF (congestive heart failure) (HCC)    diastolic  . Chronic kidney disease    hx of kidney stones,   . Depression    hx of   . Diabetes mellitus without complication ([place])   . Ganglion, left ankle and foot   . Hearing loss of both ears   . Hypertension   . MRSA infection    Oct 13 - Nov 13  . Osteoporosis   . Tinnitus of both ears     Patient Active Problem List   Diagnosis Date Noted  . GERD (gastroesophageal reflux disease) 03/10/2017  . Uses hearing aid 09/12/2016  . Asthmatic bronchitis 03/11/2016  . Chronic diastolic heart failure ([place]) 10/12/2015  . Shortness of breath 10/12/2015  . Seasonal allergies 11/21/2014  . DDD (degenerative disc disease), lumbar 10/11/2014  . H/O compression fracture of spine 10/11/2014  . Pulmonary hypertension ([place])   . Hepatic cirrhosis ([place]) 09/24/2014  . SIADH (syndrome of inappropriate ADH production) ([place]) 06/13/2014  . Insomnia  12/28/2013  . Anemia 07/06/2013  . HLD (hyperlipidemia) 06/27/2013  . Osteoporosis 07/26/2012  . Hyponatremia 05/16/2011  . Type 2 diabetes mellitus with diabetic chronic kidney disease ([place]) 08/17/2006  . Essential hypertension 08/17/2006  . DIVERTICULOSIS, COLON 08/17/2006    Past Surgical History:  Procedure Laterality Date  . BACK SURGERY     history restored after error with record merge      . INCISION AND DRAINAGE HIP  12/22/2011   Procedure: IRRIGATION AND DEBRIDEMENT HIP;  Surgeon: Mcarthur Rossetti, MD;  Location: [facility];  Service: Orthopedics;  Laterality: Right;  Irrigation and debridement right hip  . INCISION AND DRAINAGE HIP  12/27/2011   Procedure: IRRIGATION AND DEBRIDEMENT HIP;  Surgeon: Mcarthur Rossetti, MD;  Location: [facility];  Service: Orthopedics;  Laterality: Right;  Repeat irrigation and debridement  . JOINT REPLACEMENT     bilateral knee, right hip   . TOTAL HIP REVISION  12/05/2011   Procedure: TOTAL HIP REVISION;  Surgeon: Mcarthur Rossetti, MD;  Location: [facility];  Service: Orthopedics;  Laterality: Right;  Right Hip Revision Arthroplasty to Total Hip, Excision of Old Implant     OB History   None      Home Medications    Prior to Admission medications   Medication Sig Start Date End Date Taking? Authorizing Provider  Acetaminophen (TYLENOL PO) Take  1,000 tablets by mouth every 6 (six) hours as needed (pain/headache).    Yes [provider]  albuterol (PROVENTIL HFA;VENTOLIN HFA) 108 (90 Base) MCG/ACT inhaler Inhale 1-2 puffs into the lungs every 4 (four) hours as needed for wheezing or shortness of breath. 06/11/17  Yes [place], Modena Nunnery, MD  albuterol (PROVENTIL) (2.5 MG/3ML) 0.083% nebulizer solution Take 3 mLs (2.5 mg total) by nebulization every 6 (six) hours as needed for wheezing or shortness of breath. 06/11/17  Yes [place], Modena Nunnery, MD  amLODipine (NORVASC) 5 MG tablet Take 1 tablet (5 mg total) by mouth daily. 06/11/17  Yes  [place], Modena Nunnery, MD  benzonatate (TESSALON) 100 MG capsule TAKE 1 CAPSULE(100 MG) BY MOUTH TWICE DAILY AS NEEDED FOR COUGH Patient taking differently: Take 100 mg by mouth as needed for cough. TAKE 1 CAPSULE(100 MG) BY MOUTH TWICE DAILY AS NEEDED FOR COUGH 06/11/17  Yes [place], Modena Nunnery, MD  calcium carbonate (OSCAL) 1500 (600 Ca) MG TABS tablet Take 600 mg of elemental calcium by mouth 2 (two) times daily with a meal.   Yes [provider]  ciclopirox (PENLAC) 8 % solution Apply topically at bedtime. Apply over nail and surrounding skin. Apply daily over previous coat. After seven (7) days, may remove with alcohol and continue cycle. 10/12/17  Yes [place], Modena Nunnery, MD  fluticasone [facility]) 50 MCG/ACT nasal spray Place 2 sprays into both nostrils daily. Patient taking differently: Place 2 sprays into both nostrils as needed for allergies.  06/11/17  Yes [place], Modena Nunnery, MD  Fluticasone Furoate (ARNUITY ELLIPTA) 100 MCG/ACT AEPB Inhale 1 puff into the lungs daily. 10/12/17  Yes [place], Modena Nunnery, MD  furosemide (LASIX) 20 MG tablet Take 1 tablet (20 mg total) by mouth daily. For fluid - spanish speaking 06/11/17  Yes [place], Modena Nunnery, MD  insulin aspart (NOVOLOG FLEXPEN) 100 UNIT/ML FlexPen FOLLOW SLIDING SCALE AS DIRECTED~ inject 5-15 U as directed. 06/11/17  Yes [place], Modena Nunnery, MD  Insulin Glargine (LANTUS SOLOSTAR) 100 UNIT/ML Solostar Pen Inject 36 Units into the skin at bedtime. 10/12/17  Yes [place], Modena Nunnery, MD  lansoprazole (PREVACID) 30 MG capsule TAKE 1 CAPSULE BY MOUTH TWICE DAILY BEFORE A MEAL Patient taking differently: Take 30 mg by mouth 2 (two) times daily before a meal. TAKE 1 CAPSULE BY MOUTH TWICE DAILY BEFORE A MEAL 06/11/17  Yes [place], Modena Nunnery, MD  lisinopril (PRINIVIL,ZESTRIL) 10 MG tablet Take 1 tablet (10 mg total) by mouth daily. 06/11/17  Yes [place], Modena Nunnery, MD  metoprolol tartrate (LOPRESSOR) 25 MG tablet Take 1 tablet (25 mg total) by mouth 2 (two) times  daily. 12/08/16  Yes [place], Modena Nunnery, MD  naproxen sodium (ALEVE) 220 MG tablet Take 220 mg by mouth 2 (two) times daily as needed (pain/headache).   Yes [provider]  polyvinyl alcohol (ARTIFICIAL TEARS) 1.4 % ophthalmic solution Place 1 drop into both eyes as needed for dry eyes. 10/12/17  Yes [place], Modena Nunnery, MD  pravastatin (PRAVACHOL) 20 MG tablet Take 1 tablet (20 mg total) by mouth daily. 06/11/17  Yes [place], Modena Nunnery, MD  sucralfate (CARAFATE) 1 g tablet Take 1 tablet (1 g total) by mouth 4 (four) times daily. 06/11/17  Yes [place], Modena Nunnery, MD  traMADol (ULTRAM) 50 MG tablet Take 1 tablet (50 mg total) by mouth 2 (two) times daily as needed. Chronic pain 10/12/17  Yes [place], Modena Nunnery, MD  ACCU-CHEK [facility] LANCETS lancets USE AS DIRECTED 06/11/17   [place],  Modena Nunnery, MD  blood glucose meter kit and supplies KIT Dispense based on patient and insurance preference. Use up to four times daily as directed. (FOR ICD-9 250.00, 250.01). 04/25/14   Dena Billet B, PA-C  glucose blood (ACCU-CHEK AVIVA PLUS) test strip USE TO CHECK BLOOD SUGAR FOUR TIMES DAILY AS DIRECTED 06/11/17   Alycia Rossetti, MD  Insulin Pen Needle (B-D ULTRAFINE III SHORT PEN) 31G X 8 MM MISC USE AS DIRECTED 06/11/17   [place], Modena Nunnery, MD  ranitidine (ZANTAC) 150 MG tablet Take 1 tablet (150 mg total) by mouth 2 (two) times daily. Patient not taking: Reported on 10/18/2017 06/11/17   Alycia Rossetti, MD    Family History Family History  Problem Relation Age of Onset  . Hypertension Mother   . Colon cancer Neg Hx   . Breast cancer Neg Hx     Social History Social History   Tobacco Use  . Smoking status: Never Smoker  . Smokeless tobacco: Never Used  Substance Use Topics  . Alcohol use: No    Alcohol/week: 0.0 standard drinks  . Drug use: No     Allergies   Bactrim [sulfamethoxazole-trimethoprim]; Rifampin; and Vancomycin   Review of Systems Review of Systems  Constitutional: Negative for  chills and fever.  HENT: Negative for congestion and sore throat.   Eyes: Negative for visual disturbance.  Respiratory: Negative for cough and shortness of breath.   Cardiovascular: Negative for chest pain and leg swelling.  Gastrointestinal: Negative for abdominal pain, diarrhea, nausea and vomiting.  Genitourinary: Negative for dysuria and hematuria.  Musculoskeletal: Positive for arthralgias (left hip pain), gait problem (walking with limp) and neck pain. Negative for back pain.  Skin: Negative for color change and rash.  Neurological: Positive for headaches. Negative for weakness and numbness.  All other systems reviewed and are negative.    Physical Exam Updated Vital Signs BP 136/74   Pulse 63   Temp 98.2 F (36.8 C) (Oral)   Resp 16   Wt 70 kg   SpO2 97%   BMI 34.60 kg/m   Physical Exam  Constitutional: She appears well-developed and well-nourished. No distress.  HENT:  Head: Normocephalic.    Quarter sized hematoma left forehead  Eyes: Pupils are equal, round, and reactive to light. Conjunctivae are normal.  Neck: Neck supple.  Cardiovascular: Regular rhythm and intact distal pulses.  Pulmonary/Chest: Effort normal and breath sounds normal. No respiratory distress.  Abdominal: Soft. She exhibits no distension. There is no tenderness.  Musculoskeletal: She exhibits no edema.  No reproducible midline CTL spine pain  Neurological: She is alert.  Alert and oriented x3.  Cranial nerves II-XII intact.  No facial asymmetry.  5/5 grip strength bilaterally.  5/5 bicep flexion and tricep extension bilaterally. 5/5 flexion/extension at knee, hip, and ankles. No discoordination of FNF, heel to shin, and ambulates without ataxia. Does have mild limp. Not trendelenburg gait   Skin: Skin is warm and dry. Capillary refill takes less than 2 seconds.  Psychiatric: She has a normal mood and affect.  Nursing note and vitals reviewed.   ED Treatments / Results  Labs (all labs  ordered are listed, but only abnormal results are displayed) Labs Reviewed - No data to display  EKG None  Radiology Ct Head Wo Contrast  Result Date: 10/18/2017 CLINICAL DATA:  Fall, head injury. EXAM: CT HEAD WITHOUT CONTRAST CT CERVICAL SPINE WITHOUT CONTRAST TECHNIQUE: Multidetector CT imaging of the head and cervical spine was performed following  the standard protocol without intravenous contrast. Multiplanar CT image reconstructions of the cervical spine were also generated. COMPARISON:  None. FINDINGS: CT HEAD FINDINGS Brain: Ventricles are within normal limits in size and configuration. There is no mass, hemorrhage, edema or other evidence of acute parenchymal abnormality. No extra-axial hemorrhage. Vascular: Chronic calcified atherosclerotic changes of the large vessels at the skull base. No unexpected hyperdense vessel. Skull: Normal. Negative for fracture or focal lesion. Sinuses/Orbits: No acute finding. Other: Soft tissue edema overlying the LEFT frontal bone. No underlying fracture. CT CERVICAL SPINE FINDINGS Alignment: Straightening of the normal cervical spine lordosis is likely related to the mild degenerative change appreciated within the mid/lower cervical spine. No evidence of acute vertebral body subluxation. Skull base and vertebrae: No fracture line or displaced fracture fragment seen. Facet joints appear intact and normally aligned throughout. Soft tissues and spinal canal: No prevertebral fluid or swelling. No visible canal hematoma. Disc levels: Mild degenerative spondylitic changes at the C4-5 through C6-7 levels, with associated mild disc space narrowings and mild osseous spurring. No more than mild central canal stenosis at any level. Upper chest: Negative. Other: None. IMPRESSION: 1. Scalp edema overlying the LEFT frontal bone. No underlying skull fracture. 2. No acute intracranial abnormality. No intracranial hemorrhage or edema. 3. No fracture or acute subluxation within the  cervical spine. Mild degenerative change within the mid and lower cervical spine. Electronically Signed   By: Franki Cabot M.D.   On: 10/18/2017 15:47   Ct Cervical Spine Wo Contrast  Result Date: 10/18/2017 CLINICAL DATA:  Fall, head injury. EXAM: CT HEAD WITHOUT CONTRAST CT CERVICAL SPINE WITHOUT CONTRAST TECHNIQUE: Multidetector CT imaging of the head and cervical spine was performed following the standard protocol without intravenous contrast. Multiplanar CT image reconstructions of the cervical spine were also generated. COMPARISON:  None. FINDINGS: CT HEAD FINDINGS Brain: Ventricles are within normal limits in size and configuration. There is no mass, hemorrhage, edema or other evidence of acute parenchymal abnormality. No extra-axial hemorrhage. Vascular: Chronic calcified atherosclerotic changes of the large vessels at the skull base. No unexpected hyperdense vessel. Skull: Normal. Negative for fracture or focal lesion. Sinuses/Orbits: No acute finding. Other: Soft tissue edema overlying the LEFT frontal bone. No underlying fracture. CT CERVICAL SPINE FINDINGS Alignment: Straightening of the normal cervical spine lordosis is likely related to the mild degenerative change appreciated within the mid/lower cervical spine. No evidence of acute vertebral body subluxation. Skull base and vertebrae: No fracture line or displaced fracture fragment seen. Facet joints appear intact and normally aligned throughout. Soft tissues and spinal canal: No prevertebral fluid or swelling. No visible canal hematoma. Disc levels: Mild degenerative spondylitic changes at the C4-5 through C6-7 levels, with associated mild disc space narrowings and mild osseous spurring. No more than mild central canal stenosis at any level. Upper chest: Negative. Other: None. IMPRESSION: 1. Scalp edema overlying the LEFT frontal bone. No underlying skull fracture. 2. No acute intracranial abnormality. No intracranial hemorrhage or edema. 3. No  fracture or acute subluxation within the cervical spine. Mild degenerative change within the mid and lower cervical spine. Electronically Signed   By: Franki Cabot M.D.   On: 10/18/2017 15:47   Dg Hip Unilat W Or Wo Pelvis 2-3 Views Left  Result Date: 10/18/2017 CLINICAL DATA:  Fall, head injury.  LEFT leg pain. EXAM: DG HIP (WITH OR WITHOUT PELVIS) 2-3V LEFT COMPARISON:  Plain film of the pelvis dated 03/11/2017. FINDINGS: RIGHT hip arthroplasty hardware appears intact and stable in  position. There are old fractures of the RIGHT superior and inferior pubic rami, with at least some degree of chronic nonunion, stable appearance and alignment. No evidence of acute fracture line or displaced fracture fragment. LEFT femoral head is normally positioned relative to the acetabulum. No fracture seen within the proximal LEFT femur. Vascular calcifications within the soft tissues of the thighs. Soft tissues about the pelvis and LEFT hip are otherwise unremarkable. IMPRESSION: 1. No acute findings.  No acute fracture or dislocation. 2. RIGHT hip arthroplasty hardware appears intact and stable in position. 3. Old fractures of the RIGHT superior and inferior pubic rami. 4. Atherosclerosis. Electronically Signed   By: Franki Cabot M.D.   On: 10/18/2017 15:57    Procedures Procedures (including critical care time)  Medications Ordered in ED Medications  acetaminophen (TYLENOL) tablet 650 mg (650 mg Oral Given 10/18/17 1524)     Initial Impression / Assessment and Plan / ED Course  I have reviewed the triage vital signs and the nursing notes.  Pertinent labs & imaging results that were available during my care of the patient were reviewed by me and considered in my medical decision making (see chart for details).    83 year old female with history of CHF (EF 60-65%), CKD, diabetes, hypertension, & obesity who presents the emergency department today for evaluation after fall.   Pt afebrile, HDS, exam as  above. Given age, cannot r/o clinically significant intracranial injury or cervical spine injury therefore will obtain CT head and cervical spine. Will also obtain xray left hip given persistent pain but lower suspicion since can ambulate. No other injuries or pain. No recent illnesses. Had labs on 8/19 that were unremarkable including CBC and CMP. No indication for repeat labs at this time. Anticipate can dc home if scans and imaging unremarkable. Tylenol given for pain.   CT head, C spine, and Xr left hip with NAICA and no acute fractures or malalignments. No other signs of traumas. Counseled on pain control and symptomatic mgt. Stable for discharge with steady gait.   Case and plan of care discussed with Dr. Maryan Rued.  Final Clinical Impressions(s) / ED Diagnoses   Final diagnoses:  Fall, initial encounter  Acute post-traumatic headache, not intractable    ED Discharge Orders    None       Corrie Dandy, MD 10/18/17 1620    Blanchie Dessert, MD 10/19/17 [phone]

## 2017-10-20 ENCOUNTER — Ambulatory Visit
Admission: RE | Admit: 2017-10-20 | Discharge: 2017-10-20 | Disposition: A | Discharge status: Home / Self Care | Payer: Medicaid Other | Source: Ambulatory Visit | Attending: Family Medicine | Admitting: Family Medicine

## 2017-10-20 DIAGNOSIS — Z1231 Encounter for screening mammogram for malignant neoplasm of breast: Secondary | ICD-10-CM

## 2017-10-21 ENCOUNTER — Telehealth: Payer: Self-pay | Admitting: *Deleted

## 2017-10-21 ENCOUNTER — Encounter: Payer: Self-pay | Admitting: *Deleted

## 2017-10-21 NOTE — Telephone Encounter (Signed)
Received request from pharmacy for PA on Tramadol.   PA submitted.   Dx: M51.36- DDD, lumbar spine.  Received immediate approval. PA [id number] approved x180 days.   Pharmacy made aware.

## 2017-10-29 ENCOUNTER — Encounter: Payer: Self-pay | Admitting: Physician Assistant

## 2017-10-29 ENCOUNTER — Ambulatory Visit (INDEPENDENT_AMBULATORY_CARE_PROVIDER_SITE_OTHER): Payer: Medicaid Other | Admitting: Physician Assistant

## 2017-10-29 VITALS — BP 142/78 | HR 64 | Temp 98.3°F | Resp 14 | Ht <= 58 in | Wt 159.6 lb

## 2017-10-29 DIAGNOSIS — M542 Cervicalgia: Secondary | ICD-10-CM

## 2017-10-29 NOTE — Progress Notes (Signed)
Patient ID: Joyce Gilmore MRN: [id number], DOB: [birth year], 83 y.o. Date of Encounter: 10/29/2017, 2:55 PM    Chief Complaint:  Chief Complaint  Patient presents with  . pain in back of neck    symptoms for 3 days      HPI: 83 y.o. year old female presents with above.   Accompanied by a family member and also with Spanish interpreter/translator.  She reports that she is having some pain in the sides of her neck right greater than left. States that she took 2 Advil last night and those did help. Otherwise has taken no medication for this pain.  Has used no other treatments.  Has used no medications at all today.  Reviewed ER note from 10/18/2017. That reports that she presented after a fall at a restaurant where she had a fell forward hitting her head on the floor.  Had small hematoma on left forehead and was complaining of headache and neck pain.  T scan of head and neck were performed.  T cervical spine that showed some degenerative changes--- mild. The ER pain was controlled with Tylenol.     Home Meds:   Outpatient Medications Prior to Visit  Medication Sig Dispense Refill  . ACCU-CHEK SOFTCLIX LANCETS lancets USE AS DIRECTED 200 each 11  . Acetaminophen (TYLENOL PO) Take 1,000 tablets by mouth every 6 (six) hours as needed (pain/headache).     Marland Kitchen albuterol (PROVENTIL HFA;VENTOLIN HFA) 108 (90 Base) MCG/ACT inhaler Inhale 1-2 puffs into the lungs every 4 (four) hours as needed for wheezing or shortness of breath. 18 Inhaler 3  . albuterol (PROVENTIL) (2.5 MG/3ML) 0.083% nebulizer solution Take 3 mLs (2.5 mg total) by nebulization every 6 (six) hours as needed for wheezing or shortness of breath. 150 mL 1  . amLODipine (NORVASC) 5 MG tablet Take 1 tablet (5 mg total) by mouth daily. 90 tablet 3  . benzonatate (TESSALON) 100 MG capsule TAKE 1 CAPSULE(100 MG) BY MOUTH TWICE DAILY AS NEEDED FOR COUGH (Patient taking differently: Take 100 mg by mouth as needed for cough. TAKE 1  CAPSULE(100 MG) BY MOUTH TWICE DAILY AS NEEDED FOR COUGH) 20 capsule 1  . blood glucose meter kit and supplies KIT Dispense based on patient and insurance preference. Use up to four times daily as directed. (FOR ICD-9 250.00, 250.01). 1 each 0  . calcium carbonate (OSCAL) 1500 (600 Ca) MG TABS tablet Take 600 mg of elemental calcium by mouth 2 (two) times daily with a meal.    . ciclopirox (PENLAC) 8 % solution Apply topically at bedtime. Apply over nail and surrounding skin. Apply daily over previous coat. After seven (7) days, may remove with alcohol and continue cycle. 6.6 mL 2  . fluticasone (FLONASE) 50 MCG/ACT nasal spray Place 2 sprays into both nostrils daily. (Patient taking differently: Place 2 sprays into both nostrils as needed for allergies. ) 16 g 1  . Fluticasone Furoate (ARNUITY ELLIPTA) 100 MCG/ACT AEPB Inhale 1 puff into the lungs daily. 90 each 3  . furosemide (LASIX) 20 MG tablet Take 1 tablet (20 mg total) by mouth daily. For fluid - spanish speaking 90 tablet 3  . glucose blood (ACCU-CHEK AVIVA PLUS) test strip USE TO CHECK BLOOD SUGAR FOUR TIMES DAILY AS DIRECTED 100 each 5  . insulin aspart (NOVOLOG FLEXPEN) 100 UNIT/ML FlexPen FOLLOW SLIDING SCALE AS DIRECTED~ inject 5-15 U as directed. 15 mL 3  . Insulin Glargine (LANTUS SOLOSTAR) 100 UNIT/ML Solostar Pen Inject 36  Units into the skin at bedtime. 15 mL 11  . Insulin Pen Needle (B-D ULTRAFINE III SHORT PEN) 31G X 8 MM MISC USE AS DIRECTED 100 each 11  . lansoprazole (PREVACID) 30 MG capsule TAKE 1 CAPSULE BY MOUTH TWICE DAILY BEFORE A MEAL (Patient taking differently: Take 30 mg by mouth 2 (two) times daily before a meal. TAKE 1 CAPSULE BY MOUTH TWICE DAILY BEFORE A MEAL) 180 capsule 3  . lisinopril (PRINIVIL,ZESTRIL) 10 MG tablet Take 1 tablet (10 mg total) by mouth daily. 90 tablet 3  . metoprolol tartrate (LOPRESSOR) 25 MG tablet Take 1 tablet (25 mg total) by mouth 2 (two) times daily. 180 tablet 3  . naproxen sodium  (ALEVE) 220 MG tablet Take 220 mg by mouth 2 (two) times daily as needed (pain/headache).    . polyvinyl alcohol (ARTIFICIAL TEARS) 1.4 % ophthalmic solution Place 1 drop into both eyes as needed for dry eyes. 15 mL 3  . pravastatin (PRAVACHOL) 20 MG tablet Take 1 tablet (20 mg total) by mouth daily. 90 tablet 3  . ranitidine (ZANTAC) 150 MG tablet Take 1 tablet (150 mg total) by mouth 2 (two) times daily. 60 tablet 0  . sucralfate (CARAFATE) 1 g tablet Take 1 tablet (1 g total) by mouth 4 (four) times daily. 360 tablet 3  . traMADol (ULTRAM) 50 MG tablet Take 1 tablet (50 mg total) by mouth 2 (two) times daily as needed. Chronic pain 60 tablet 1   No facility-administered medications prior to visit.     Allergies:  Allergies  Allergen Reactions  . Bactrim [Sulfamethoxazole-Trimethoprim] Other (See Comments)    Hyperkalemia (elevated potassium)  . Rifampin Other (See Comments)    Severe thrombocytopenia (low blood platelet count)  . Vancomycin Other (See Comments)    Severe thrombocytopenia (low blood platelet count)      Review of Systems: See HPI for pertinent ROS. All other ROS negative.    Physical Exam: Blood pressure (!) 142/78, pulse 64, temperature 98.3 F (36.8 C), temperature source Oral, resp. rate 14, height 4' 8" (1.422 m), weight 72.4 kg, SpO2 97 %., Body mass index is 35.78 kg/m. General: Hispanic Female.  Appears in no acute distress. Neck: Supple. No thyromegaly. No lymphadenopathy.  She has tenderness with palpation along the sides of her neck over the trapezius muscles.  This is mild tenderness.  Range of motion is intact.   Lungs: Clear bilaterally to auscultation without wheezes, rales, or rhonchi. Breathing is unlabored. Heart: Regular rhythm. No murmurs, rubs, or gallops. Msk:  Strength and tone normal for age. Extremities/Skin: Warm and dry.  Neuro: Alert and oriented X 3. Moves all extremities spontaneously. Gait is normal. CNII-XII grossly in tact. Psych:   Responds to questions appropriately with a normal affect.     ASSESSMENT AND PLAN:  83 y.o. year old female with   1. Neck pain She can alternate using Tylenol and Advil.  I wrote down for them that she needs to space out the Tylenol every 6 hours and the Advil every 8 hours. She can also apply heat to the area with a heating pad. Recommend gently stretching the neck.  Also can gently massage the neck muscles.  Follow-up if needed.   Marin Olp [place], [place], [facility] 10/29/2017 2:55 PM

## 2017-10-30 ENCOUNTER — Encounter (HOSPITAL_COMMUNITY): Payer: Self-pay | Admitting: Emergency Medicine

## 2017-10-30 ENCOUNTER — Emergency Department (HOSPITAL_COMMUNITY)
Admission: EM | Admit: 2017-10-30 | Discharge: 2017-10-30 | Disposition: A | Discharge status: Home / Self Care | Payer: Medicaid Other | Attending: Emergency Medicine | Admitting: Emergency Medicine

## 2017-10-30 DIAGNOSIS — M62838 Other muscle spasm: Secondary | ICD-10-CM | POA: Diagnosis not present

## 2017-10-30 DIAGNOSIS — E119 Type 2 diabetes mellitus without complications: Secondary | ICD-10-CM | POA: Insufficient documentation

## 2017-10-30 DIAGNOSIS — I110 Hypertensive heart disease with heart failure: Secondary | ICD-10-CM | POA: Diagnosis not present

## 2017-10-30 DIAGNOSIS — I5032 Chronic diastolic (congestive) heart failure: Secondary | ICD-10-CM | POA: Diagnosis not present

## 2017-10-30 DIAGNOSIS — Z794 Long term (current) use of insulin: Secondary | ICD-10-CM | POA: Diagnosis not present

## 2017-10-30 DIAGNOSIS — G44209 Tension-type headache, unspecified, not intractable: Secondary | ICD-10-CM | POA: Insufficient documentation

## 2017-10-30 DIAGNOSIS — Z79899 Other long term (current) drug therapy: Secondary | ICD-10-CM | POA: Insufficient documentation

## 2017-10-30 DIAGNOSIS — E785 Hyperlipidemia, unspecified: Secondary | ICD-10-CM | POA: Insufficient documentation

## 2017-10-30 DIAGNOSIS — R51 Headache: Secondary | ICD-10-CM | POA: Diagnosis present

## 2017-10-30 MED ORDER — KETOROLAC TROMETHAMINE 60 MG/2ML IM SOLN
30.0000 mg | Freq: Once | INTRAMUSCULAR | Status: AC
  Administered 2017-10-30: 30 mg via INTRAMUSCULAR
  Filled 2017-10-30: qty 2

## 2017-10-30 NOTE — Discharge Instructions (Addendum)
1. Medications: Continue naprosyn and tylenol, usual home medications 2. Treatment: rest, drink plenty of fluids, heat a sock filled with rice for warm compresses to the neck 3. Follow Up: Please followup with your primary doctor in 1-2 days for discussion of your diagnoses and further evaluation after today's visit; if you do not have a primary care doctor use the resource guide provided to find one; Please return to the ER for new or worsening symptoms, confusion, vision changes, numbness, tingling, weakness or other concerns

## 2017-10-30 NOTE — ED Provider Notes (Signed)
[facility] EMERGENCY DEPARTMENT Provider Note   CSN: [id number] Arrival date & time: 10/30/17  0507     History   Chief Complaint Chief Complaint  Patient presents with  . Headache    HPI Joyce Gilmore is a 83 y.o. female with a hx of arthritis, CHF, depression, HTN, osteoporosis, presents to the Emergency Department complaining of gradual, persistent, progressively worsening posterior and frontal headache onset 5 days ago.  Pt had a fall on 10/18/17, striking her head.  She is not anticoagulated and a negative head and neck CT at the time of the injury.  That reports her headache begins in the posterior portion of her head and radiates to the front.  States she has associated right-sided neck pain upper back as well.  Pain is worsened with movement.  She has been taking Tylenol and naproxen at home with mild relief.  They have attempted some warm compresses which helps the most but is not resolving her headache.  No vision changes, photophobia, phonophobia, numbness, tingling, weakness, loss of bowel or bladder control.  She has been ambulatory at home without difficulty.  The history is provided by the patient, medical records and a relative. The history is limited by a language barrier. A language interpreter was used.    Past Medical History:  Diagnosis Date  . Arthritis   . CHF (congestive heart failure) (HCC)    diastolic  . Chronic kidney disease    hx of kidney stones,   . Depression    hx of   . Diabetes mellitus without complication ([place])   . Ganglion, left ankle and foot   . Hearing loss of both ears   . Hypertension   . MRSA infection    Oct 13 - Nov 13  . Osteoporosis   . Tinnitus of both ears     Patient Active Problem List   Diagnosis Date Noted  . GERD (gastroesophageal reflux disease) 03/10/2017  . Uses hearing aid 09/12/2016  . Asthmatic bronchitis 03/11/2016  . Chronic diastolic heart failure ([place]) 10/12/2015  . Shortness of breath  10/12/2015  . Seasonal allergies 11/21/2014  . DDD (degenerative disc disease), lumbar 10/11/2014  . H/O compression fracture of spine 10/11/2014  . Pulmonary hypertension ([place])   . Hepatic cirrhosis ([place]) 09/24/2014  . SIADH (syndrome of inappropriate ADH production) ([place]) 06/13/2014  . Insomnia 12/28/2013  . Anemia 07/06/2013  . HLD (hyperlipidemia) 06/27/2013  . Osteoporosis 07/26/2012  . Hyponatremia 05/16/2011  . Type 2 diabetes mellitus with diabetic chronic kidney disease ([place]) 08/17/2006  . Essential hypertension 08/17/2006  . DIVERTICULOSIS, COLON 08/17/2006    Past Surgical History:  Procedure Laterality Date  . BACK SURGERY     history restored after error with record merge      . INCISION AND DRAINAGE HIP  12/22/2011   Procedure: IRRIGATION AND DEBRIDEMENT HIP;  Surgeon: Mcarthur Rossetti, MD;  Location: [facility];  Service: Orthopedics;  Laterality: Right;  Irrigation and debridement right hip  . INCISION AND DRAINAGE HIP  12/27/2011   Procedure: IRRIGATION AND DEBRIDEMENT HIP;  Surgeon: Mcarthur Rossetti, MD;  Location: [facility];  Service: Orthopedics;  Laterality: Right;  Repeat irrigation and debridement  . JOINT REPLACEMENT     bilateral knee, right hip   . TOTAL HIP REVISION  12/05/2011   Procedure: TOTAL HIP REVISION;  Surgeon: Mcarthur Rossetti, MD;  Location: [facility];  Service: Orthopedics;  Laterality: Right;  Right Hip Revision Arthroplasty to  Total Hip, Excision of Old Implant     OB History   None      Home Medications    Prior to Admission medications   Medication Sig Start Date End Date Taking? Authorizing Provider  Acetaminophen (TYLENOL PO) Take 1,000 tablets by mouth every 6 (six) hours as needed (pain/headache).    Yes [provider]  albuterol (PROVENTIL HFA;VENTOLIN HFA) 108 (90 Base) MCG/ACT inhaler Inhale 1-2 puffs into the lungs every 4 (four) hours as needed for wheezing or shortness of breath. 06/11/17  Yes [place], Modena Nunnery, MD  albuterol (PROVENTIL) (2.5 MG/3ML) 0.083% nebulizer solution Take 3 mLs (2.5 mg total) by nebulization every 6 (six) hours as needed for wheezing or shortness of breath. 06/11/17  Yes [place], Modena Nunnery, MD  amLODipine (NORVASC) 5 MG tablet Take 1 tablet (5 mg total) by mouth daily. 06/11/17  Yes [place], Modena Nunnery, MD  benzonatate (TESSALON) 100 MG capsule TAKE 1 CAPSULE(100 MG) BY MOUTH TWICE DAILY AS NEEDED FOR COUGH Patient taking differently: Take 100 mg by mouth as needed for cough. TAKE 1 CAPSULE(100 MG) BY MOUTH TWICE DAILY AS NEEDED FOR COUGH 06/11/17  Yes [place], Modena Nunnery, MD  calcium carbonate (OSCAL) 1500 (600 Ca) MG TABS tablet Take 600 mg of elemental calcium by mouth 2 (two) times daily with a meal.   Yes [provider]  ciclopirox (PENLAC) 8 % solution Apply topically at bedtime. Apply over nail and surrounding skin. Apply daily over previous coat. After seven (7) days, may remove with alcohol and continue cycle. 10/12/17  Yes [place], Modena Nunnery, MD  fluticasone [facility]) 50 MCG/ACT nasal spray Place 2 sprays into both nostrils daily. Patient taking differently: Place 2 sprays into both nostrils as needed for allergies.  06/11/17  Yes [place], Modena Nunnery, MD  Fluticasone Furoate (ARNUITY ELLIPTA) 100 MCG/ACT AEPB Inhale 1 puff into the lungs daily. 10/12/17  Yes [place], Modena Nunnery, MD  furosemide (LASIX) 20 MG tablet Take 1 tablet (20 mg total) by mouth daily. For fluid - spanish speaking 06/11/17  Yes [place], Modena Nunnery, MD  insulin aspart (NOVOLOG FLEXPEN) 100 UNIT/ML FlexPen FOLLOW SLIDING SCALE AS DIRECTED~ inject 5-15 U as directed. Patient taking differently: Inject 5-15 Units into the skin 3 (three) times daily with meals. FOLLOW SLIDING SCALE 06/11/17  Yes [place], Modena Nunnery, MD  Insulin Glargine (LANTUS SOLOSTAR) 100 UNIT/ML Solostar Pen Inject 36 Units into the skin at bedtime. 10/12/17  Yes [place], Modena Nunnery, MD  lansoprazole (PREVACID) 30 MG capsule TAKE 1 CAPSULE BY MOUTH  TWICE DAILY BEFORE A MEAL Patient taking differently: Take 30 mg by mouth 2 (two) times daily before a meal. TAKE 1 CAPSULE BY MOUTH TWICE DAILY BEFORE A MEAL 06/11/17  Yes [place], Modena Nunnery, MD  lisinopril (PRINIVIL,ZESTRIL) 10 MG tablet Take 1 tablet (10 mg total) by mouth daily. 06/11/17  Yes [place], Modena Nunnery, MD  metoprolol tartrate (LOPRESSOR) 25 MG tablet Take 1 tablet (25 mg total) by mouth 2 (two) times daily. 12/08/16  Yes [place], Modena Nunnery, MD  naproxen sodium (ALEVE) 220 MG tablet Take 220 mg by mouth 2 (two) times daily as needed (pain/headache).   Yes [provider]  polyvinyl alcohol (ARTIFICIAL TEARS) 1.4 % ophthalmic solution Place 1 drop into both eyes as needed for dry eyes. 10/12/17  Yes [place], Modena Nunnery, MD  pravastatin (PRAVACHOL) 20 MG tablet Take 1 tablet (20 mg total) by mouth daily. 06/11/17  Yes , Modena Nunnery, MD  ranitidine Ketur[facility] Shavers)  150 MG tablet Take 1 tablet (150 mg total) by mouth 2 (two) times daily. 06/11/17  Yes [place], Modena Nunnery, MD  sucralfate (CARAFATE) 1 g tablet Take 1 tablet (1 g total) by mouth 4 (four) times daily. 06/11/17  Yes [place], Modena Nunnery, MD  traMADol (ULTRAM) 50 MG tablet Take 1 tablet (50 mg total) by mouth 2 (two) times daily as needed. Chronic pain 10/12/17  Yes [place], Modena Nunnery, MD  ACCU-CHEK [facility] LANCETS lancets USE AS DIRECTED 06/11/17   Alycia Rossetti, MD  blood glucose meter kit and supplies KIT Dispense based on patient and insurance preference. Use up to four times daily as directed. (FOR ICD-9 250.00, 250.01). 04/25/14   Dena Billet B, PA-C  glucose blood (ACCU-CHEK AVIVA PLUS) test strip USE TO CHECK BLOOD SUGAR FOUR TIMES DAILY AS DIRECTED 06/11/17   Alycia Rossetti, MD  Insulin Pen Needle (B-D ULTRAFINE III SHORT PEN) 31G X 8 MM MISC USE AS DIRECTED 06/11/17   Alycia Rossetti, MD    Family History Family History  Problem Relation Age of Onset  . Hypertension Mother   . Colon cancer Neg Hx   . Breast cancer Neg Hx       Social History Social History   Tobacco Use  . Smoking status: Never Smoker  . Smokeless tobacco: Never Used  Substance Use Topics  . Alcohol use: No    Alcohol/week: 0.0 standard drinks  . Drug use: No     Allergies   Bactrim [sulfamethoxazole-trimethoprim]; Rifampin; and Vancomycin   Review of Systems Review of Systems  Constitutional: Negative for appetite change, diaphoresis, fatigue, fever and unexpected weight change.  HENT: Negative for mouth sores.   Eyes: Negative for visual disturbance.  Respiratory: Negative for cough, chest tightness, shortness of breath and wheezing.   Cardiovascular: Negative for chest pain.  Gastrointestinal: Negative for abdominal pain, constipation, diarrhea, nausea and vomiting.  Endocrine: Negative for polydipsia, polyphagia and polyuria.  Genitourinary: Negative for dysuria, frequency, hematuria and urgency.  Musculoskeletal: Positive for neck pain. Negative for back pain and neck stiffness.  Skin: Negative for rash.  Allergic/Immunologic: Negative for immunocompromised state.  Neurological: Positive for headaches. Negative for syncope and light-headedness.  Hematological: Does not bruise/bleed easily.  Psychiatric/Behavioral: Negative for sleep disturbance. The patient is not nervous/anxious.      Physical Exam Updated Vital Signs BP (!) 128/59   Pulse 63   Temp 98.3 F (36.8 C) (Oral)   SpO2 97%   Physical Exam  Constitutional: She is oriented to person, place, and time. She appears well-developed and well-nourished. No distress.  HENT:  Head: Normocephalic and atraumatic.  Mouth/Throat: Oropharynx is clear and moist.  Eyes: Pupils are equal, round, and reactive to light. Conjunctivae and EOM are normal. No scleral icterus.  No horizontal, vertical or rotational nystagmus  Neck: Normal range of motion. Neck supple.  Full active and passive ROM with mild right sided pain No midline tenderness; right sided paraspinal  tenderness No nuchal rigidity or meningeal signs  Cardiovascular: Normal rate, regular rhythm and intact distal pulses.  Pulmonary/Chest: Effort normal and breath sounds normal. No respiratory distress. She has no wheezes. She has no rales.  Abdominal: Soft. Bowel sounds are normal. There is no tenderness. There is no rebound and no guarding.  Musculoskeletal: Normal range of motion.  Lymphadenopathy:    She has no cervical adenopathy.  Neurological: She is alert and oriented to person, place, and time. No cranial nerve deficit. She exhibits normal  muscle tone. Coordination normal.  Mental Status:  Alert, oriented, thought content appropriate. Speech fluent without evidence of aphasia. Able to follow 2 step commands without difficulty.  Cranial Nerves:  II:  pupils equal, round, reactive to light III,IV, VI: ptosis not present, extra-ocular motions intact bilaterally  V,VII: smile symmetric, facial light touch sensation equal VIII: hearing grossly normal bilaterally  IX,X: midline uvula rise  XI: bilateral shoulder shrug equal and strong XII: midline tongue extension  Motor:  5/5 in upper and lower extremities bilaterally including strong and equal grip strength and dorsiflexion/plantar flexion Sensory: light touch normal in all extremities.  CV: distal pulses palpable throughout   Skin: Skin is warm and dry. No rash noted. She is not diaphoretic.  Psychiatric: She has a normal mood and affect. Her behavior is normal. Judgment and thought content normal.  Nursing note and vitals reviewed.    ED Treatments / Results   Procedures Procedures (including critical care time)  Medications Ordered in ED Medications  ketorolac (TORADOL) injection 30 mg (30 mg Intramuscular Given 10/30/17 0654)     Initial Impression / Assessment and Plan / ED Course  I have reviewed the triage vital signs and the nursing notes.  Pertinent labs & imaging results that were available during my care of  the patient were reviewed by me and considered in my medical decision making (see chart for details).     Pt presents with headache several days after fall.  Right sided neck tenderness with palpable spasm.  CT from 8/25 reviewed with no evidence of fracture.  Suspect headache 2/2 msk strain from fall.  Pt given toradol here in the ED.  Discussed conservative therapies including heat and continued use of NSAIDs.  Discussed reasons to return to the ED including new or worsening symptoms.    The patient was discussed with and seen by Dr. Leonette Monarch who agrees with the treatment plan.   Final Clinical Impressions(s) / ED Diagnoses   Final diagnoses:  Tension headache  Neck muscle spasm    ED Discharge Orders    None       Loni Muse Gwenlyn Perking 10/30/17 [place], MD 10/30/17 [phone]

## 2017-10-30 NOTE — ED Triage Notes (Signed)
Per EMS pt is non [place] speaking, lives with family, grandson on scene states pt has had headache x 6 days. Light sensitivity.  Had a fall and was seen here on 8/25 for same. Saw PCP yesterday for same and was told to alternate tylenol and advil

## 2017-11-23 ENCOUNTER — Encounter: Payer: Self-pay | Admitting: Physician Assistant

## 2017-11-23 ENCOUNTER — Ambulatory Visit: Payer: Medicaid Other | Admitting: Physician Assistant

## 2017-11-23 VITALS — BP 132/84 | HR 68 | Temp 98.1°F | Resp 14 | Ht <= 58 in | Wt 159.4 lb

## 2017-11-23 DIAGNOSIS — Z23 Encounter for immunization: Secondary | ICD-10-CM | POA: Diagnosis not present

## 2017-11-23 DIAGNOSIS — M542 Cervicalgia: Secondary | ICD-10-CM | POA: Diagnosis not present

## 2017-11-23 DIAGNOSIS — E119 Type 2 diabetes mellitus without complications: Secondary | ICD-10-CM

## 2017-11-23 DIAGNOSIS — Z794 Long term (current) use of insulin: Secondary | ICD-10-CM | POA: Diagnosis not present

## 2017-11-23 MED ORDER — HYDROCODONE-ACETAMINOPHEN 5-325 MG PO TABS: 1.0000 | ORAL_TABLET | Freq: Four times a day (QID) | ORAL | 0 refills | Status: DC | PRN

## 2017-11-23 NOTE — Progress Notes (Signed)
Patient ID: Joyce Gilmore MRN: [id number], DOB: [birth year], 83 y.o. Date of Encounter: _0 @  Chief Complaint:  Chief Complaint  Patient presents with  . lab work follow up    HPI: 83 y.o. year old female  presents with above.   Spanish translator/interpreter present through visit.  Also patient's daughter present for visit.  Report that her last visit here was after a trip to [place].  Report that at that visit she had labs and then about a week later they got a phone call about those lab results.  They came in today to discuss what they were trying to say during that phone call regarding the lab results.  Review Dr. Dorian Heckle note from 10/12/2017. Also reviewed the lab results from that date. Once he was 8.7.  Other labs were stable. Result note at that time stated to please verify insulin dose with daughter--- it was supposed to be Lantus 38 units daily, NovoLog 6 units with meals. Result note stated that if she was taking that on a regular basis to increase the Lantus to 40 units and keep the NovoLog at 6 units and to check blood sugars 3 times daily and schedule follow-up with me in 1 month.  They did not understand that phone message so were here to get that information of the phone message now. Daughter states that currently administering Lantus 38 units daily.  Currently giving the NovoLog 6 units 3 times per day with meals.  I have told him to continue the NovoLog at 6 units 3 times daily with meals. Today I have told him to increase the Lantus to 40 units daily.  Also they report that she is continuing to have pain in her neck.  I reviewed that medication list/treatment was that she was supposed to be using tramadol for this.  He states that this makes her dizzy so she is not taking this.  Also They are requesting flu shot.  Past Medical History:  Diagnosis Date  . Arthritis   . CHF (congestive heart failure) (HCC)    diastolic  . Chronic kidney disease    hx of kidney  stones,   . Depression    hx of   . Diabetes mellitus without complication ([place])   . Ganglion, left ankle and foot   . Hearing loss of both ears   . Hypertension   . MRSA infection    Oct 13 - Nov 13  . Osteoporosis   . Tinnitus of both ears      Home Meds: Outpatient Medications Prior to Visit  Medication Sig Dispense Refill  . ACCU-CHEK SOFTCLIX LANCETS lancets USE AS DIRECTED 200 each 11  . Acetaminophen (TYLENOL PO) Take 1,000 tablets by mouth every 6 (six) hours as needed (pain/headache).     Marland Kitchen albuterol (PROVENTIL HFA;VENTOLIN HFA) 108 (90 Base) MCG/ACT inhaler Inhale 1-2 puffs into the lungs every 4 (four) hours as needed for wheezing or shortness of breath. 18 Inhaler 3  . albuterol (PROVENTIL) (2.5 MG/3ML) 0.083% nebulizer solution Take 3 mLs (2.5 mg total) by nebulization every 6 (six) hours as needed for wheezing or shortness of breath. 150 mL 1  . amLODipine (NORVASC) 5 MG tablet Take 1 tablet (5 mg total) by mouth daily. 90 tablet 3  . benzonatate (TESSALON) 100 MG capsule TAKE 1 CAPSULE(100 MG) BY MOUTH TWICE DAILY AS NEEDED FOR COUGH (Patient taking differently: Take 100 mg by mouth as needed for cough. TAKE 1 CAPSULE(100 MG) BY MOUTH  TWICE DAILY AS NEEDED FOR COUGH) 20 capsule 1  . blood glucose meter kit and supplies KIT Dispense based on patient and insurance preference. Use up to four times daily as directed. (FOR ICD-9 250.00, 250.01). 1 each 0  . calcium carbonate (OSCAL) 1500 (600 Ca) MG TABS tablet Take 600 mg of elemental calcium by mouth 2 (two) times daily with a meal.    . ciclopirox (PENLAC) 8 % solution Apply topically at bedtime. Apply over nail and surrounding skin. Apply daily over previous coat. After seven (7) days, may remove with alcohol and continue cycle. 6.6 mL 2  . fluticasone (FLONASE) 50 MCG/ACT nasal spray Place 2 sprays into both nostrils daily. (Patient taking differently: Place 2 sprays into both nostrils as needed for allergies. ) 16 g 1  .  Fluticasone Furoate (ARNUITY ELLIPTA) 100 MCG/ACT AEPB Inhale 1 puff into the lungs daily. 90 each 3  . furosemide (LASIX) 20 MG tablet Take 1 tablet (20 mg total) by mouth daily. For fluid - spanish speaking 90 tablet 3  . glucose blood (ACCU-CHEK AVIVA PLUS) test strip USE TO CHECK BLOOD SUGAR FOUR TIMES DAILY AS DIRECTED 100 each 5  . insulin aspart (NOVOLOG FLEXPEN) 100 UNIT/ML FlexPen FOLLOW SLIDING SCALE AS DIRECTED~ inject 5-15 U as directed. (Patient taking differently: Inject 5-15 Units into the skin 3 (three) times daily with meals. FOLLOW SLIDING SCALE) 15 mL 3  . Insulin Glargine (LANTUS SOLOSTAR) 100 UNIT/ML Solostar Pen Inject 36 Units into the skin at bedtime. 15 mL 11  . Insulin Pen Needle (B-D ULTRAFINE III SHORT PEN) 31G X 8 MM MISC USE AS DIRECTED 100 each 11  . lansoprazole (PREVACID) 30 MG capsule TAKE 1 CAPSULE BY MOUTH TWICE DAILY BEFORE A MEAL (Patient taking differently: Take 30 mg by mouth 2 (two) times daily before a meal. TAKE 1 CAPSULE BY MOUTH TWICE DAILY BEFORE A MEAL) 180 capsule 3  . lisinopril (PRINIVIL,ZESTRIL) 10 MG tablet Take 1 tablet (10 mg total) by mouth daily. 90 tablet 3  . metoprolol tartrate (LOPRESSOR) 25 MG tablet Take 1 tablet (25 mg total) by mouth 2 (two) times daily. 180 tablet 3  . naproxen sodium (ALEVE) 220 MG tablet Take 220 mg by mouth 2 (two) times daily as needed (pain/headache).    . polyvinyl alcohol (ARTIFICIAL TEARS) 1.4 % ophthalmic solution Place 1 drop into both eyes as needed for dry eyes. 15 mL 3  . pravastatin (PRAVACHOL) 20 MG tablet Take 1 tablet (20 mg total) by mouth daily. 90 tablet 3  . ranitidine (ZANTAC) 150 MG tablet Take 1 tablet (150 mg total) by mouth 2 (two) times daily. 60 tablet 0  . sucralfate (CARAFATE) 1 g tablet Take 1 tablet (1 g total) by mouth 4 (four) times daily. 360 tablet 3  . traMADol (ULTRAM) 50 MG tablet Take 1 tablet (50 mg total) by mouth 2 (two) times daily as needed. Chronic pain 60 tablet 1   No  facility-administered medications prior to visit.     Allergies:  Allergies  Allergen Reactions  . Bactrim [Sulfamethoxazole-Trimethoprim] Other (See Comments)    Hyperkalemia (elevated potassium)  . Rifampin Other (See Comments)    Severe thrombocytopenia (low blood platelet count)  . Vancomycin Other (See Comments)    Severe thrombocytopenia (low blood platelet count)    Social History   Socioeconomic History  . Marital status: Widowed    Spouse name: Not on file  . Number of children: 7  . Years of  education: Not on file  . Highest education level: Not on file  Occupational History  . Not on file  Social Needs  . Financial resource strain: Not on file  . Food insecurity:    Worry: Not on file    Inability: Not on file  . Transportation needs:    Medical: Not on file    Non-medical: Not on file  Tobacco Use  . Smoking status: Never Smoker  . Smokeless tobacco: Never Used  Substance and Sexual Activity  . Alcohol use: No    Alcohol/week: 0.0 standard drinks  . Drug use: No  . Sexual activity: Never  Lifestyle  . Physical activity:    Days per week: Not on file    Minutes per session: Not on file  . Stress: Not on file  Relationships  . Social connections:    Talks on phone: Not on file    Gets together: Not on file    Attends religious service: Not on file    Active member of club or organization: Not on file    Attends meetings of clubs or organizations: Not on file    Relationship status: Not on file  . Intimate partner violence:    Fear of current or ex partner: Not on file    Emotionally abused: Not on file    Physically abused: Not on file    Forced sexual activity: Not on file  Other Topics Concern  . Not on file  Social History Narrative   ** Merged History Encounter **        Family History  Problem Relation Age of Onset  . Hypertension Mother   . Colon cancer Neg Hx   . Breast cancer Neg Hx      Review of Systems:  See HPI for  pertinent ROS. All other ROS negative.    Physical Exam: Blood pressure 132/84, pulse 68, temperature 98.1 F (36.7 C), temperature source Oral, resp. rate 14, height _0  (1.422 m), weight 72.3 kg, SpO2 95 %., Body mass index is 35.74 kg/m. General:  [place] female. Appears in no acute distress. Neck: Supple. No thyromegaly. No lymphadenopathy. Lungs: Clear bilaterally to auscultation without wheezes, rales, or rhonchi. Breathing is unlabored. Heart: RRR with S1 S2. No murmurs, rubs, or gallops. Abdomen: Soft, non-tender, non-distended with normoactive bowel sounds. No hepatomegaly. No rebound/guarding. No obvious abdominal masses. Musculoskeletal:  Strength and tone normal for age. Extremities/Skin: Warm and dry. No edema.  Neuro: Alert and oriented X 3. Moves all extremities spontaneously. Gait is normal. CNII-XII grossly in tact. Psych:  Responds to questions appropriately with a normal affect.     ASSESSMENT AND PLAN:  83 y.o. year old female with  1. Diabetes mellitus type 2, insulin dependent ([place]) She is to increase the Lantus to 40 units daily.  I have written this down and documented and discussed it out loud with them.  Translater/interpreter has reviewed with them and they voiced understanding. Continue the NovoLog 6 units 3 times a day with meals.  2. Neck pain She reports she is having dizziness with tramadol also cannot use tramadol.  Daughter and patient report that she has had no side effects/adverse effects from any other pain meds.  Therefore will use hydrocodone now. - HYDROcodone-acetaminophen (NORCO/VICODIN) 5-325 MG tablet; Take 1 tablet by mouth every 6 (six) hours as needed.  Dispense: 30 tablet; Refill: 0  She will schedule routine follow-up back with Dr. Buelah Manis.  Follow-up sooner if needed.  Signed,  [address] [place], [place], [facility] 11/23/2017 12:37 PM

## 2017-12-02 ENCOUNTER — Other Ambulatory Visit: Payer: Self-pay | Admitting: Family Medicine

## 2017-12-21 ENCOUNTER — Other Ambulatory Visit: Payer: Self-pay | Admitting: Family Medicine

## 2017-12-22 ENCOUNTER — Ambulatory Visit (INDEPENDENT_AMBULATORY_CARE_PROVIDER_SITE_OTHER): Payer: Medicaid Other

## 2017-12-22 ENCOUNTER — Encounter (INDEPENDENT_AMBULATORY_CARE_PROVIDER_SITE_OTHER): Payer: Self-pay | Admitting: Orthopaedic Surgery

## 2017-12-22 ENCOUNTER — Ambulatory Visit (INDEPENDENT_AMBULATORY_CARE_PROVIDER_SITE_OTHER): Payer: Medicaid Other | Admitting: Orthopaedic Surgery

## 2017-12-22 ENCOUNTER — Other Ambulatory Visit (INDEPENDENT_AMBULATORY_CARE_PROVIDER_SITE_OTHER): Payer: Self-pay

## 2017-12-22 DIAGNOSIS — G8929 Other chronic pain: Secondary | ICD-10-CM

## 2017-12-22 DIAGNOSIS — M7062 Trochanteric bursitis, left hip: Secondary | ICD-10-CM

## 2017-12-22 DIAGNOSIS — M5442 Lumbago with sciatica, left side: Secondary | ICD-10-CM

## 2017-12-22 DIAGNOSIS — H9190 Unspecified hearing loss, unspecified ear: Secondary | ICD-10-CM

## 2017-12-22 MED ORDER — METHYLPREDNISOLONE ACETATE 40 MG/ML IJ SUSP
40.0000 mg | INTRAMUSCULAR | Status: AC | PRN
  Administered 2017-12-22: 40 mg via INTRA_ARTICULAR

## 2017-12-22 MED ORDER — LIDOCAINE HCL 1 % IJ SOLN
3.0000 mL | INTRAMUSCULAR | Status: AC | PRN
  Administered 2017-12-22: 3 mL

## 2017-12-22 NOTE — Progress Notes (Signed)
Office Visit Note   Patient: Joyce Gilmore           Date of Birth: August 14, 1934           MRN: [id number] Visit Date: 12/22/2017              Requested by: Alycia Rossetti, MD [address] [place], [place] [address] PCP: Alycia Rossetti, MD   Assessment & Plan: Visit Diagnoses:  1. Chronic left-sided low back pain with left-sided sciatica   2. Trochanteric bursitis, left hip     Plan: I did place an injection in the trochanteric area of her left hip.  I do feel its essentially obtain an MRI of her lumbar spine due to significant degenerative changes that she has and the fact that she is still having significant sciatica and radicular symptoms.  An MRI will help determine where best to provide some type of intervention in her spine in terms of the facet joint injection versus epidural.  I explained this to her through the interpreter and the family is in agreement with this plan.  We will review the MRI once it is done and then make plans for what the treatment options will be.  All question concerns were answered and addressed.  Follow-Up Instructions: Follow-up will be once we have the MRI results.  Orders:  Orders Placed This Encounter  Procedures  . Large Joint Inj  . XR Lumbar Spine 2-3 Views   No orders of the defined types were placed in this encounter.     Procedures: Large Joint Inj: L greater trochanter on 12/22/2017 8:58 AM Indications: pain and diagnostic evaluation Details: 22 G 1.5 in needle, lateral approach  Arthrogram: No  Medications: 3 mL lidocaine 1 %; 40 mg methylPREDNISolone acetate 40 MG/ML Outcome: tolerated well, no immediate complications Procedure, treatment alternatives, risks and benefits explained, specific risks discussed. Consent was given by the patient. Immediately prior to procedure a time out was called to verify the correct patient, procedure, equipment, support staff and site/side marked as required. Patient was prepped and draped in  the usual sterile fashion.       Clinical Data: No additional findings.   Subjective: Chief Complaint  Patient presents with  . 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  We have seen her before on multiple occasions.  She had a fall back in August and she is had left-sided hip pain and really low back pain to the left side since then.  She does walk with a cane.  Is been worse when walking is more of a constant pain.  She points to her low back and sciatic region and some of the trochanteric area as a source of her pain.  Interpreter is assisting with the visit today.  There is no significant weakness that she describes her leg.  We have x-rays of her hip from August and then we obtain x-rays of her spine today based on what she is describing her clinical exam.  She denies any change in bowel bladder function.  HPI  Review of Systems There is no chest pain or shortness of breath, there is no fever, chills, nausea, vomiting.  She does ambulate using a cane.  Objective: Vital Signs: There were no vitals taken for this visit.  Physical Exam She is alert and oriented x3 and in no acute distress Ortho Exam Examination of  her left hip shows that I can put her through internal extra rotation with no pain in the groin at all.  She has a left total knee arthroplasty and there appears to be no acute trauma to her left lower extremity.  She does have some pain to palpation of trochanteric area but most her pain seems to be in the lumbar spine and sciatic region of her lower back. Specialty Comments:  No specialty comments available.  Imaging: Xr Lumbar Spine 2-3 Views  Result Date: 12/22/2017 2 views lumbar spine show significant degenerative changes throughout her lumbar spine especially at L4-5 and L5-S1 with her significant disc space narrowing as well as severe arthritic changes in the posterior elements.    PMFS  History: Patient Active Problem List   Diagnosis Date Noted  . GERD (gastroesophageal reflux disease) 03/10/2017  . Uses hearing aid 09/12/2016  . Asthmatic bronchitis 03/11/2016  . Chronic diastolic heart failure ([place]) 10/12/2015  . Shortness of breath 10/12/2015  . Seasonal allergies 11/21/2014  . DDD (degenerative disc disease), lumbar 10/11/2014  . H/O compression fracture of spine 10/11/2014  . Pulmonary hypertension ([place])   . Hepatic cirrhosis ([place]) 09/24/2014  . SIADH (syndrome of inappropriate ADH production) ([place]) 06/13/2014  . Insomnia 12/28/2013  . Anemia 07/06/2013  . HLD (hyperlipidemia) 06/27/2013  . Osteoporosis 07/26/2012  . Hyponatremia 05/16/2011  . Type 2 diabetes mellitus with diabetic chronic kidney disease ([place]) 08/17/2006  . Essential hypertension 08/17/2006  . DIVERTICULOSIS, COLON 08/17/2006   Past Medical History:  Diagnosis Date  . Arthritis   . CHF (congestive heart failure) (HCC)    diastolic  . Chronic kidney disease    hx of kidney stones,   . Depression    hx of   . Diabetes mellitus without complication ([place])   . Ganglion, left ankle and foot   . Hearing loss of both ears   . Hypertension   . MRSA infection    Oct 13 - Nov 13  . Osteoporosis   . Tinnitus of both ears     Family History  Problem Relation Age of Onset  . Hypertension Mother   . Colon cancer Neg Hx   . Breast cancer Neg Hx     Past Surgical History:  Procedure Laterality Date  . BACK SURGERY     history restored after error with record merge      . INCISION AND DRAINAGE HIP  12/22/2011   Procedure: IRRIGATION AND DEBRIDEMENT HIP;  Surgeon: Mcarthur Rossetti, MD;  Location: [facility];  Service: Orthopedics;  Laterality: Right;  Irrigation and debridement right hip  . INCISION AND DRAINAGE HIP  12/27/2011   Procedure: IRRIGATION AND DEBRIDEMENT HIP;  Surgeon: Mcarthur Rossetti, MD;  Location: [facility];  Service: Orthopedics;  Laterality: Right;  Repeat irrigation and  debridement  . JOINT REPLACEMENT     bilateral knee, right hip   . TOTAL HIP REVISION  12/05/2011   Procedure: TOTAL HIP REVISION;  Surgeon: Mcarthur Rossetti, MD;  Location: [facility];  Service: Orthopedics;  Laterality: Right;  Right Hip Revision Arthroplasty to Total Hip, Excision of Old Implant   Social History   Occupational History  . Not on file  Tobacco Use  . Smoking status: Never Smoker  . Smokeless tobacco: Never Used  Substance and Sexual Activity  . Alcohol use: No    Alcohol/week: 0.0 standard drinks  . Drug use: No  . Sexual activity: Never

## 2018-01-01 ENCOUNTER — Other Ambulatory Visit: Payer: Self-pay | Admitting: Family Medicine

## 2018-01-06 ENCOUNTER — Other Ambulatory Visit: Payer: Medicaid Other

## 2018-01-08 ENCOUNTER — Ambulatory Visit
Admission: RE | Admit: 2018-01-08 | Discharge: 2018-01-08 | Disposition: A | Discharge status: Home / Self Care | Payer: Medicaid Other | Source: Ambulatory Visit | Attending: Orthopaedic Surgery | Admitting: Orthopaedic Surgery

## 2018-01-08 DIAGNOSIS — M48061 Spinal stenosis, lumbar region without neurogenic claudication: Secondary | ICD-10-CM | POA: Diagnosis not present

## 2018-01-08 DIAGNOSIS — G8929 Other chronic pain: Secondary | ICD-10-CM

## 2018-01-08 DIAGNOSIS — M5442 Lumbago with sciatica, left side: Principal | ICD-10-CM

## 2018-01-13 ENCOUNTER — Ambulatory Visit (INDEPENDENT_AMBULATORY_CARE_PROVIDER_SITE_OTHER): Payer: Medicaid Other | Admitting: Orthopaedic Surgery

## 2018-01-13 ENCOUNTER — Encounter (INDEPENDENT_AMBULATORY_CARE_PROVIDER_SITE_OTHER): Payer: Self-pay | Admitting: Orthopaedic Surgery

## 2018-01-13 DIAGNOSIS — M5416 Radiculopathy, lumbar region: Secondary | ICD-10-CM | POA: Diagnosis not present

## 2018-01-13 DIAGNOSIS — M48062 Spinal stenosis, lumbar region with neurogenic claudication: Secondary | ICD-10-CM

## 2018-01-13 NOTE — Progress Notes (Signed)
HPI: Joyce Gilmore returns today to go over the MRI of her lumbar spine.  She continues to have pain that radiates down the left leg to the ankle.  She has had no bowel bladder dysfunction.  No fevers or chills.  Denies any pain down the right leg. Interpreter is present today.  MRI images are reviewed with patient and her family.  MRI of the lumbar spine showed increased L4-5 anterior spondylolisthesis with a new large paracentral and subarticular disc extrusion resulting in severe spinal stenosis and severe left and moderate right lateral recess stenosis mild to moderate right and moderate to severe left neural foraminal stenosis.  Bilateral facet joint effusions present at this level.  L5-S1 minimal disc bulge and severe facet hypertrophy resulting in left moderate recess and moderate left neural foraminal stenosis which is unchanged from prior study.  No spinal stenosis.  10 mm mass in the left aspect of the spinal canal at T-9 -T10 appears slow-growing it was suggested that this may represent a peripheral nerve sheath tumor.  Review of systems: Please see HPI otherwise negative  Physical exam: General well-developed well-nourished female no acute distress.  Mood and affect appropriate.  She rises up from sitting position easily on her own.  Positive straight leg raise on the left negative on the right.  Impression: Low back pain with radicular symptoms left leg Spinal canal mass T9-T10  Plan: This point time due to patient's age recommend most conservative measures not being epidural steroid injection with Dr. Ernestina Patches at L4-5.  She may require additional epidural steroid injections at other levels based on her response to the initial injections and I discussed this with her and her family today through the interpreter.  We will also obtain a MRI with and without contrast of her thoracic spine to better assess the spinal canal mass.  Discussed with him that this does appear to be benign due to the fact  that it is slow-growing.  Have her follow-up with Dr. Ninfa Linden after the thoracic spine MRI.  Questions were encouraged and answered at length.

## 2018-01-14 ENCOUNTER — Other Ambulatory Visit (INDEPENDENT_AMBULATORY_CARE_PROVIDER_SITE_OTHER): Payer: Self-pay

## 2018-01-14 DIAGNOSIS — R937 Abnormal findings on diagnostic imaging of other parts of musculoskeletal system: Secondary | ICD-10-CM

## 2018-01-19 ENCOUNTER — Ambulatory Visit (INDEPENDENT_AMBULATORY_CARE_PROVIDER_SITE_OTHER): Payer: Medicaid Other | Admitting: Physical Medicine and Rehabilitation

## 2018-01-19 ENCOUNTER — Ambulatory Visit (INDEPENDENT_AMBULATORY_CARE_PROVIDER_SITE_OTHER): Payer: Self-pay

## 2018-01-19 ENCOUNTER — Encounter (INDEPENDENT_AMBULATORY_CARE_PROVIDER_SITE_OTHER): Payer: Self-pay | Admitting: Physical Medicine and Rehabilitation

## 2018-01-19 VITALS — BP 152/72 | HR 63 | Temp 98.4°F

## 2018-01-19 DIAGNOSIS — M5116 Intervertebral disc disorders with radiculopathy, lumbar region: Secondary | ICD-10-CM | POA: Diagnosis not present

## 2018-01-19 DIAGNOSIS — M48062 Spinal stenosis, lumbar region with neurogenic claudication: Secondary | ICD-10-CM | POA: Diagnosis not present

## 2018-01-19 DIAGNOSIS — M5416 Radiculopathy, lumbar region: Secondary | ICD-10-CM | POA: Diagnosis not present

## 2018-01-19 MED ORDER — METHYLPREDNISOLONE ACETATE 80 MG/ML IJ SUSP
80.0000 mg | Freq: Once | INTRAMUSCULAR | Status: AC
  Administered 2018-01-19: 80 mg

## 2018-01-19 NOTE — Progress Notes (Signed)
 .  Numeric Pain Rating Scale and Functional Assessment Average Pain 8   In the last MONTH (on 0-10 scale) has pain interfered with the following?  1. General activity like being  able to carry out your everyday physical activities such as walking, climbing stairs, carrying groceries, or moving a chair?  Rating(8)   +Driver, -BT, -Dye Allergies.  

## 2018-01-19 NOTE — Patient Instructions (Signed)

## 2018-01-26 ENCOUNTER — Ambulatory Visit
Admission: RE | Admit: 2018-01-26 | Discharge: 2018-01-26 | Disposition: A | Discharge status: Home / Self Care | Payer: Medicaid Other | Source: Ambulatory Visit | Attending: Orthopaedic Surgery | Admitting: Orthopaedic Surgery

## 2018-01-26 DIAGNOSIS — R937 Abnormal findings on diagnostic imaging of other parts of musculoskeletal system: Secondary | ICD-10-CM

## 2018-01-26 DIAGNOSIS — M5124 Other intervertebral disc displacement, thoracic region: Secondary | ICD-10-CM | POA: Diagnosis not present

## 2018-01-26 MED ORDER — GADOBENATE DIMEGLUMINE 529 MG/ML IV SOLN
7.0000 mL | Freq: Once | INTRAVENOUS | Status: AC | PRN
  Administered 2018-01-26: 7 mL via INTRAVENOUS

## 2018-01-27 ENCOUNTER — Ambulatory Visit (INDEPENDENT_AMBULATORY_CARE_PROVIDER_SITE_OTHER): Payer: Medicaid Other | Admitting: Family Medicine

## 2018-01-27 ENCOUNTER — Other Ambulatory Visit: Payer: Self-pay

## 2018-01-27 ENCOUNTER — Encounter: Payer: Self-pay | Admitting: Family Medicine

## 2018-01-27 VITALS — BP 140/72 | HR 56 | Temp 98.2°F | Resp 14 | Ht <= 58 in | Wt 160.0 lb

## 2018-01-27 DIAGNOSIS — J4540 Moderate persistent asthma, uncomplicated: Secondary | ICD-10-CM

## 2018-01-27 DIAGNOSIS — N182 Chronic kidney disease, stage 2 (mild): Secondary | ICD-10-CM

## 2018-01-27 DIAGNOSIS — Z794 Long term (current) use of insulin: Secondary | ICD-10-CM

## 2018-01-27 DIAGNOSIS — F419 Anxiety disorder, unspecified: Secondary | ICD-10-CM | POA: Diagnosis not present

## 2018-01-27 DIAGNOSIS — I10 Essential (primary) hypertension: Secondary | ICD-10-CM

## 2018-01-27 DIAGNOSIS — M5136 Other intervertebral disc degeneration, lumbar region: Secondary | ICD-10-CM

## 2018-01-27 DIAGNOSIS — K219 Gastro-esophageal reflux disease without esophagitis: Secondary | ICD-10-CM | POA: Diagnosis not present

## 2018-01-27 DIAGNOSIS — M51369 Other intervertebral disc degeneration, lumbar region without mention of lumbar back pain or lower extremity pain: Secondary | ICD-10-CM

## 2018-01-27 DIAGNOSIS — E1122 Type 2 diabetes mellitus with diabetic chronic kidney disease: Secondary | ICD-10-CM | POA: Diagnosis not present

## 2018-01-27 MED ORDER — TRAMADOL HCL 50 MG PO TABS: 50.0000 mg | ORAL_TABLET | Freq: Two times a day (BID) | ORAL | 0 refills | Status: DC | PRN

## 2018-01-27 MED ORDER — INSULIN GLARGINE 100 UNIT/ML SOLOSTAR PEN: 36.0000 [IU] | PEN_INJECTOR | Freq: Every day | SUBCUTANEOUS | 11 refills | Status: DC

## 2018-01-27 MED ORDER — METOPROLOL TARTRATE 25 MG PO TABS: 12.5000 mg | ORAL_TABLET | Freq: Two times a day (BID) | ORAL | 3 refills | Status: DC

## 2018-01-27 MED ORDER — FLUTICASONE FUROATE 100 MCG/ACT IN AEPB: 1.0000 | INHALATION_SPRAY | Freq: Every day | RESPIRATORY_TRACT | 3 refills | Status: DC

## 2018-01-27 MED ORDER — ALBUTEROL SULFATE HFA 108 (90 BASE) MCG/ACT IN AERS: 1.0000 | INHALATION_SPRAY | RESPIRATORY_TRACT | 3 refills | Status: DC | PRN

## 2018-01-27 MED ORDER — PRAVASTATIN SODIUM 20 MG PO TABS: 20.0000 mg | ORAL_TABLET | Freq: Every day | ORAL | 3 refills | Status: DC

## 2018-01-27 MED ORDER — LANSOPRAZOLE 30 MG PO CPDR: DELAYED_RELEASE_CAPSULE | ORAL | 3 refills | Status: DC

## 2018-01-27 MED ORDER — BENZONATATE 100 MG PO CAPS: 100.0000 mg | ORAL_CAPSULE | ORAL | 1 refills | Status: DC | PRN

## 2018-01-27 MED ORDER — INSULIN ASPART 100 UNIT/ML FLEXPEN: PEN_INJECTOR | SUBCUTANEOUS | 3 refills | Status: DC

## 2018-01-27 NOTE — Assessment & Plan Note (Signed)
Ultram given for pain Avoid ibuprofen

## 2018-01-27 NOTE — Patient Instructions (Signed)
Tramadol for pain Use orange inhaler every day  aluterol as needed Prescription for diabetic shoes Metoprolol take 1/2 tablet twice a day  Lasoprazole 1 capsule twice a day for her stomach  F/U March  Needs 30 minute slot

## 2018-01-27 NOTE — Assessment & Plan Note (Signed)
Discussed the inhalers in detail and which to use meds sent to pharmacy Arnuity Ellipta daily Albuterol neb and inhaler prn

## 2018-01-27 NOTE — Assessment & Plan Note (Signed)
Has been fairly well controlled Check A1C Insulin refilled Diabetic shoes pre ulcerative callus

## 2018-01-27 NOTE — Progress Notes (Signed)
Subjective:    Patient ID: Joyce Gilmore, female    DOB: [birth year], 83 y.o.   MRN: [id number]  Patient presents for Follow-up (is not fasting- going to [place] 01/28/2018)  Pt here to f/u  CHRONIC medical problems and medications, she is leaving for [place] tomorrow, out of some of  her meds, interpreter available near the end of the visit.  At end of visit states sometimes she gets nervious or anxious and eats more, often when she goes to [place], asked if there was something she could take DM - did not bring her meter, taking insulin Asthma/bronchitis- not using her Ellipta preventative steroid, has some albuterol  GERD- not taking PPI BID, but still gets some symptoms at night, she is taking carafate, she is not on zantac  HTN- she has been taking metoprolol once a day instead of BID   Very difficult and confusing visit, of what she was taking, her daughter was not present today  Needed new script for diabetic shoes    Review Of Systems:  GEN- denies fatigue, fever, weight loss,weakness, recent illness HEENT- denies eye drainage, change in vision, nasal discharge, CVS- denies chest pain, palpitations RESP- denies SOB, cough, wheeze ABD- denies N/V, change in stools, abd pain GU- denies dysuria, hematuria, dribbling, incontinence MSK- + joint pain, muscle aches, injury Neuro- denies headache, dizziness, syncope, seizure activity       Objective:    BP 140/72   Pulse (!) 56   Temp 98.2 F (36.8 C) (Oral)   Resp 14   Ht 4\' 8"  (1.422 m)   Wt 160 lb (72.6 kg)   SpO2 99%   BMI 35.87 kg/m  GEN- NAD, alert and oriented x3 HEENT- PERRL, EOMI, non injected sclera, pink conjunctiva, MMM, oropharynx clear Neck- Supple, no thyromegaly CVS- RRR, no murmur RESP-CTAB ABD-NABS,soft,NT,ND EXT- No edema,callus of feet, corn left foot 3rd digit Psych- normal affect and mood Pulses- Radial, DP- 2+        Assessment & Plan:      Problem List Items Addressed This Visit      Unprioritized   Asthmatic bronchitis - Primary    Discussed the inhalers in detail and which to use meds sent to pharmacy Arnuity Ellipta daily Albuterol neb and inhaler prn      Relevant Medications   albuterol (PROVENTIL HFA;VENTOLIN HFA) 108 (90 Base) MCG/ACT inhaler   Fluticasone Furoate (ARNUITY ELLIPTA) 100 MCG/ACT AEPB   DDD (degenerative disc disease), lumbar    Ultram given for pain Avoid ibuprofen      Relevant Medications   traMADol (ULTRAM) 50 MG tablet   Essential hypertension    Since she has been taking metoprolol once a day and HR already borderline low, will have her split and take 12.5mg  BID Continue other anti-hypertensives      Relevant Medications   metoprolol tartrate (LOPRESSOR) 25 MG tablet   pravastatin (PRAVACHOL) 20 MG tablet   Other Relevant Orders   CBC with Differential/Platelet   Comprehensive metabolic panel   GERD (gastroesophageal reflux disease)    Continue carafate Prevacid BID      Relevant Medications   lansoprazole (PREVACID) 30 MG capsule   Type 2 diabetes mellitus with diabetic chronic kidney disease (HCC)    Has been fairly well controlled Check A1C Insulin refilled Diabetic shoes pre ulcerative callus      Relevant Medications   insulin aspart (NOVOLOG FLEXPEN) 100 UNIT/ML FlexPen   Insulin Glargine (LANTUS SOLOSTAR) 100 UNIT/ML Solostar Pen  pravastatin (PRAVACHOL) 20 MG tablet   Other Relevant Orders   CBC with Differential/Platelet   Comprehensive metabolic panel   Hemoglobin A1c    Other Visit Diagnoses    Anxiety       not very clear, her nervousness leading her to eat, no weight changes, prefer not to give her new med, when she is leaving the country, this has been ongoing for years as well      Note: This dictation was prepared with Diplomatic Services operational officer dictation along with smaller phrase technology. Any transcriptional errors that result from this process are unintentional.

## 2018-01-27 NOTE — Assessment & Plan Note (Signed)
Since she has been taking metoprolol once a day and HR already borderline low, will have her split and take 12.5mg  BID Continue other anti-hypertensives

## 2018-01-27 NOTE — Assessment & Plan Note (Signed)
Continue carafate Prevacid BID

## 2018-01-28 LAB — COMPREHENSIVE METABOLIC PANEL
AG Ratio: 1.6 (calc) (ref 1.0–2.5)
ALKALINE PHOSPHATASE (APISO): 73 U/L (ref 33–130)
ALT: 14 U/L (ref 6–29)
AST: 15 U/L (ref 10–35)
Albumin: 4.1 g/dL (ref 3.6–5.1)
BUN/Creatinine Ratio: 25 (calc) — ABNORMAL HIGH (ref 6–22)
BUN: 27 mg/dL — ABNORMAL HIGH (ref 7–25)
CALCIUM: 10.3 mg/dL (ref 8.6–10.4)
CO2: 25 mmol/L (ref 20–32)
Chloride: 98 mmol/L (ref 98–110)
Creat: 1.1 mg/dL — ABNORMAL HIGH (ref 0.60–0.88)
Globulin: 2.6 g/dL (calc) (ref 1.9–3.7)
Glucose, Bld: 128 mg/dL — ABNORMAL HIGH (ref 65–99)
POTASSIUM: 5.3 mmol/L (ref 3.5–5.3)
SODIUM: 133 mmol/L — AB (ref 135–146)
TOTAL PROTEIN: 6.7 g/dL (ref 6.1–8.1)
Total Bilirubin: 0.4 mg/dL (ref 0.2–1.2)

## 2018-01-28 LAB — CBC WITH DIFFERENTIAL/PLATELET
BASOS PCT: 0.5 %
Basophils Absolute: 51 cells/uL (ref 0–200)
Eosinophils Absolute: 112 cells/uL (ref 15–500)
Eosinophils Relative: 1.1 %
HEMATOCRIT: 39.1 % (ref 35.0–45.0)
HEMOGLOBIN: 13.1 g/dL (ref 11.7–15.5)
LYMPHS ABS: 2173 {cells}/uL (ref 850–3900)
MCH: 28.3 pg (ref 27.0–33.0)
MCHC: 33.5 g/dL (ref 32.0–36.0)
MCV: 84.4 fL (ref 80.0–100.0)
MONOS PCT: 10.2 %
MPV: 10.2 fL (ref 7.5–12.5)
NEUTROS ABS: 6824 {cells}/uL (ref 1500–7800)
Neutrophils Relative %: 66.9 %
Platelets: 269 10*3/uL (ref 140–400)
RBC: 4.63 10*6/uL (ref 3.80–5.10)
RDW: 13.1 % (ref 11.0–15.0)
Total Lymphocyte: 21.3 %
WBC mixed population: 1040 cells/uL — ABNORMAL HIGH (ref 200–950)
WBC: 10.2 10*3/uL (ref 3.8–10.8)

## 2018-01-28 LAB — HEMOGLOBIN A1C
EAG (MMOL/L): 9.5 (calc)
Hgb A1c MFr Bld: 7.6 % of total Hgb — ABNORMAL HIGH (ref <5.7)
Mean Plasma Glucose: 171 (calc)

## 2018-01-29 ENCOUNTER — Telehealth: Payer: Self-pay | Admitting: *Deleted

## 2018-01-29 ENCOUNTER — Encounter: Payer: Self-pay | Admitting: *Deleted

## 2018-01-29 MED ORDER — FLUTICASONE FUROATE 100 MCG/ACT IN AEPB: 1.0000 | INHALATION_SPRAY | Freq: Every day | RESPIRATORY_TRACT | 3 refills | Status: DC

## 2018-01-29 NOTE — Telephone Encounter (Signed)
Received request from pharmacy for Joyce Gilmore on [place].   PA submitted.   Dx: Persistent Bronchitis  Received immediate determination.   PA [id number] approved 01/29/2018- 01/30/2019.

## 2018-02-10 NOTE — Progress Notes (Signed)
Joyce Gilmore - 83 y.o. female MRN [id number]  Date of birth: Feb 10, 1935  Office Visit Note: Visit Date: 01/19/2018 PCP: Alycia Rossetti, MD Referred by: Alycia Rossetti, MD  Subjective: Chief Complaint  Patient presents with  . Lower Back - Pain  . Left Leg - Pain   HPI:  Joyce Gilmore is a 83 y.o. female who comes in today At the request of Dr. Jean Rosenthal and Benita Stabile, P.A.-C for left-sided epidural injection for the patient's severe left radicular leg pain and MRI findings of significant disc herniation at L4-5 to the left with significant stenosis multifactorial.  Unfortunately patient is headed back to [place] for an extended time as she may require a couple of injections to calm this down.  Lower thoracic mass noted on MRI with Dr. Ninfa Linden following up with thoracic MRI.  MRI also noted transitional segment with a transitional segment labeled L5.  ROS Otherwise per HPI.  Assessment & Plan: Visit Diagnoses:  1. Lumbar radiculopathy   2. Spinal stenosis of lumbar region with neurogenic claudication   3. Radiculopathy due to lumbar intervertebral disc disorder     Plan: No additional findings.   Meds & Orders:  Meds ordered this encounter  Medications  . methylPREDNISolone acetate (DEPO-MEDROL) injection 80 mg    Orders Placed This Encounter  Procedures  . XR C-ARM NO REPORT  . Epidural Steroid injection    Follow-up: Return if symptoms worsen or fail to improve.   Procedures: No procedures performed  Lumbar Epidural Steroid Injection - Interlaminar Approach with Fluoroscopic Guidance  Patient: Joyce Gilmore      Date of Birth: [birth year] MRN: [id number] PCP: Alycia Rossetti, MD      Visit Date: 01/19/2018   Universal Protocol:     Consent Given By: the patient  Position: PRONE  Additional Comments: Vital signs were monitored before and after the procedure. Patient was prepped and draped in the usual sterile fashion. The correct patient,  procedure, and site was verified.   Injection Procedure Details:  Procedure Site One Meds Administered:  Meds ordered this encounter  Medications  . methylPREDNISolone acetate (DEPO-MEDROL) injection 80 mg     Laterality: Left  Location/Site:  L4-L5  Needle size: 20 G  Needle type: Tuohy  Needle Placement: Paramedian epidural  Findings:   -Comments: Excellent flow of contrast into the epidural space.  Procedure Details: Using a paramedian approach from the side mentioned above, the region overlying the inferior lamina was localized under fluoroscopic visualization and the soft tissues overlying this structure were infiltrated with 4 ml. of 1% Lidocaine without Epinephrine. The Tuohy needle was inserted into the epidural space using a paramedian approach.   The epidural space was localized using loss of resistance along with lateral and bi-planar fluoroscopic views.  After negative aspirate for air, blood, and CSF, a 2 ml. volume of Isovue-250 was injected into the epidural space and the flow of contrast was observed. Radiographs were obtained for documentation purposes.    The injectate was administered into the level noted above.   Additional Comments:  The patient tolerated the procedure well Dressing: Band-Aid    Post-procedure details: Patient was observed during the procedure. Post-procedure instructions were reviewed.  Patient left the clinic in stable condition.   Clinical History: MRI LUMBAR SPINE WITHOUT CONTRAST  TECHNIQUE: Multiplanar, multisequence MR imaging of the lumbar spine was performed. No intravenous contrast was administered.  COMPARISON:  11/23/2014  FINDINGS: Segmentation: The numbering used  on the prior study is continued on today's examination with the lowest fully formed intervertebral disc space designated L5-S1. With this numbering, there are no ribs at T12.  Alignment: Chronically exaggerated lumbar lordosis. New or  increased anterolisthesis of L4 on L5 measuring 5 mm. Mild lumbar levoscoliosis.  Vertebrae: No fracture, suspicious osseous lesion, or significant marrow edema. Unchanged L3 vertebral body hemangioma. 10 mm mass in the left aspect of the spinal canal at T9-10, only imaged sagittally (previously 7 mm).  Conus medullaris and cauda equina: Conus extends to the T12-L1 level. Conus and cauda equina appear normal.  Paraspinal and other soft tissues: Chronic severe right renal atrophy and hydronephrosis.  Disc levels:  Disc desiccation throughout the lumbar spine with exception of L5-S1.  T10-11: Only imaged sagittally. Minimal disc bulging and mild facet arthrosis without significant stenosis, unchanged.  T11-12: Only imaged sagittally. Mild disc bulging and small central disc protrusion without stenosis, unchanged.  T12-L1: Mild disc bulging greater to the left and mild facet arthrosis without stenosis, unchanged.  L1-2: Mild disc bulging greater to the right and mild facet hypertrophy result in borderline to mild right lateral recess stenosis without spinal or neural foraminal stenosis, unchanged.  L2-3: Mild disc bulging and mild facet and ligamentum flavum hypertrophy without stenosis, unchanged.  L3-4: Disc bulging greater to the right and mild right and moderate left facet hypertrophy result in mild right neural foraminal stenosis without spinal stenosis, unchanged.  L4-5: New mild disc space narrowing. Anterolisthesis with bulging uncovered disc, a new large left paracentral and subarticular disc extrusion with superior migration to the L4 pedicle level, and severe facet and ligamentum flavum hypertrophy result in progressive severe spinal stenosis, severe left and moderate right lateral recess stenosis, and mild-to-moderate right and moderate to severe left neural foraminal stenosis. The disc extrusion may affect the left L4 and L5 nerve roots. There are  bilateral facet joint effusions.  L5-S1: Minimal leftward disc bulging and severe facet hypertrophy result in moderate left lateral recess and moderate left neural foraminal stenosis, unchanged. No spinal stenosis.  IMPRESSION: 1. Progressive disc and facet degeneration at L4-5 with new anterolisthesis and a new large left paracentral/subarticular disc extrusion. Severe spinal and left lateral recess stenosis and moderate to severe left neural foraminal stenosis. 2. Unchanged disc and facet degeneration elsewhere as above. 3. Mildly increased size of left-sided spinal canal mass at T9-10, now 10 mm. Slow growth favors a benign entity such as a peripheral nerve sheath tumor, however consider thoracic spine MRI without and with contrast for further assessment.   Electronically Signed   By: Logan Bores M.D.   On: 01/09/2018 07:36     Objective:  VS:  HT:    WT:   BMI:     BP:(!) 152/72  HR:63bpm  TEMP:98.4 F (36.9 C)(Oral)  RESP:  Physical Exam  Ortho Exam Imaging: No results found.

## 2018-02-10 NOTE — Procedures (Signed)
Lumbar Epidural Steroid Injection - Interlaminar Approach with Fluoroscopic Guidance  Patient: Joyce Gilmore      Date of Birth: Feb 18, 1935 MRN: [id number] PCP: Alycia Rossetti, MD      Visit Date: 01/19/2018   Universal Protocol:     Consent Given By: the patient  Position: PRONE  Additional Comments: Vital signs were monitored before and after the procedure. Patient was prepped and draped in the usual sterile fashion. The correct patient, procedure, and site was verified.   Injection Procedure Details:  Procedure Site One Meds Administered:  Meds ordered this encounter  Medications  . methylPREDNISolone acetate (DEPO-MEDROL) injection 80 mg     Laterality: Left  Location/Site:  L4-L5  Needle size: 20 G  Needle type: Tuohy  Needle Placement: Paramedian epidural  Findings:   -Comments: Excellent flow of contrast into the epidural space.  Procedure Details: Using a paramedian approach from the side mentioned above, the region overlying the inferior lamina was localized under fluoroscopic visualization and the soft tissues overlying this structure were infiltrated with 4 ml. of 1% Lidocaine without Epinephrine. The Tuohy needle was inserted into the epidural space using a paramedian approach.   The epidural space was localized using loss of resistance along with lateral and bi-planar fluoroscopic views.  After negative aspirate for air, blood, and CSF, a 2 ml. volume of Isovue-250 was injected into the epidural space and the flow of contrast was observed. Radiographs were obtained for documentation purposes.    The injectate was administered into the level noted above.   Additional Comments:  The patient tolerated the procedure well Dressing: Band-Aid    Post-procedure details: Patient was observed during the procedure. Post-procedure instructions were reviewed.  Patient left the clinic in stable condition.

## 2018-04-18 ENCOUNTER — Other Ambulatory Visit: Payer: Self-pay | Admitting: Family Medicine

## 2018-04-20 ENCOUNTER — Encounter: Payer: Self-pay | Admitting: Family Medicine

## 2018-04-20 ENCOUNTER — Ambulatory Visit: Payer: Medicaid Other | Admitting: Family Medicine

## 2018-04-20 VITALS — BP 132/72 | HR 61 | Temp 98.5°F | Resp 16 | Ht <= 58 in | Wt 159.1 lb

## 2018-04-20 DIAGNOSIS — J441 Chronic obstructive pulmonary disease with (acute) exacerbation: Secondary | ICD-10-CM | POA: Diagnosis not present

## 2018-04-20 DIAGNOSIS — R319 Hematuria, unspecified: Secondary | ICD-10-CM | POA: Diagnosis not present

## 2018-04-20 DIAGNOSIS — R350 Frequency of micturition: Secondary | ICD-10-CM | POA: Diagnosis not present

## 2018-04-20 DIAGNOSIS — N390 Urinary tract infection, site not specified: Secondary | ICD-10-CM | POA: Diagnosis not present

## 2018-04-20 DIAGNOSIS — N898 Other specified noninflammatory disorders of vagina: Secondary | ICD-10-CM

## 2018-04-20 LAB — URINALYSIS, ROUTINE W REFLEX MICROSCOPIC
Bilirubin Urine: NEGATIVE
Glucose, UA: NEGATIVE
Ketones, ur: NEGATIVE
Nitrite: NEGATIVE
PROTEIN: NEGATIVE
Specific Gravity, Urine: 1.015 (ref 1.001–1.03)
pH: 6 (ref 5.0–8.0)

## 2018-04-20 LAB — MICROSCOPIC MESSAGE

## 2018-04-20 MED ORDER — PREDNISONE 20 MG PO TABS: 40.0000 mg | ORAL_TABLET | Freq: Every day | ORAL | 0 refills | Status: DC

## 2018-04-20 MED ORDER — AZITHROMYCIN 250 MG PO TABS: ORAL_TABLET | ORAL | 0 refills | Status: DC

## 2018-04-20 MED ORDER — FLUCONAZOLE 150 MG PO TABS: 150.0000 mg | ORAL_TABLET | Freq: Once | ORAL | 0 refills | Status: AC

## 2018-04-20 MED ORDER — LEVOFLOXACIN 750 MG PO TABS: 750.0000 mg | ORAL_TABLET | Freq: Every day | ORAL | 0 refills | Status: DC

## 2018-04-20 NOTE — Progress Notes (Signed)
Patient ID: Joyce Gilmore, female    DOB: 05/22/1934, 84 y.o.   MRN: [id number]  PCP: Alycia Rossetti, MD  Chief Complaint  Patient presents with  . Urinary Tract Infection    Patient in with c/o urinary frequency and itching. Onset 3 weeks ago. Seen for UTI in [place]  . Cough    Subjective:   Joyce Gilmore is a 84 y.o. female, presents to clinic with CC of this morning a little itching with urinating this am, but she denies dysuria, hematuria, frequency or urgency.  She is here today with her daughter and with a [place] interpreter.  They state that she was recently in [place] and had a urinary tract infection about 3 weeks ago but they do not know what antibiotic she was treated with.  She denies abdominal pain, back pain, flank pain, decreased appetite, nausea, vomiting, fevers.  She is also complaining of cough and wheeze.  Cough is productive with yellow sputum, she does have shortness of breath and is not using her inhaler very much.  She has known asthmatic bronchitis, heart failure, DM.  HPI    Patient Active Problem List   Diagnosis Date Noted  . GERD (gastroesophageal reflux disease) 03/10/2017  . Uses hearing aid 09/12/2016  . Asthmatic bronchitis 03/11/2016  . Chronic diastolic heart failure ([place]) 10/12/2015  . Shortness of breath 10/12/2015  . Seasonal allergies 11/21/2014  . DDD (degenerative disc disease), lumbar 10/11/2014  . H/O compression fracture of spine 10/11/2014  . Pulmonary hypertension ([place])   . Hepatic cirrhosis ([place]) 09/24/2014  . SIADH (syndrome of inappropriate ADH production) ([place]) 06/13/2014  . Insomnia 12/28/2013  . Anemia 07/06/2013  . HLD (hyperlipidemia) 06/27/2013  . Osteoporosis 07/26/2012  . Hyponatremia 05/16/2011  . Type 2 diabetes mellitus with diabetic chronic kidney disease ([place]) 08/17/2006  . Essential hypertension 08/17/2006  . DIVERTICULOSIS, COLON 08/17/2006     Prior to Admission medications   Medication Sig Start Date End  Date Taking? Authorizing Provider  ACCU-CHEK AVIVA PLUS test strip USE TO CHECK BLOOD SUGAR FOUR TIMES DAILY AS DIRECTED 12/21/17  Yes [place], Modena Nunnery, MD  ACCU-CHEK SOFTCLIX LANCETS lancets USE AS DIRECTED 06/11/17  Yes [place], Modena Nunnery, MD  Acetaminophen (TYLENOL PO) Take 1,000 tablets by mouth every 6 (six) hours as needed (pain/headache).    Yes [provider]  albuterol (PROVENTIL HFA;VENTOLIN HFA) 108 (90 Base) MCG/ACT inhaler Inhale 1-2 puffs into the lungs every 4 (four) hours as needed for wheezing or shortness of breath. 01/27/18  Yes [place], Modena Nunnery, MD  albuterol (PROVENTIL) (2.5 MG/3ML) 0.083% nebulizer solution Take 3 mLs (2.5 mg total) by nebulization every 6 (six) hours as needed for wheezing or shortness of breath. 06/11/17  Yes [place], Modena Nunnery, MD  amLODipine (NORVASC) 5 MG tablet TAKE 1 TABLET BY MOUTH DAILY 04/19/18  Yes [place], Modena Nunnery, MD  blood glucose meter kit and supplies KIT Dispense based on patient and insurance preference. Use up to four times daily as directed. (FOR ICD-9 250.00, 250.01). 04/25/14  Yes [place], Mary B, PA-C  calcium carbonate (OSCAL) 1500 (600 Ca) MG TABS tablet Take 600 mg of elemental calcium by mouth 2 (two) times daily with a meal.   Yes [provider]  Fluticasone Furoate (ARNUITY ELLIPTA) 100 MCG/ACT AEPB Inhale 1 puff into the lungs daily. 01/29/18  Yes [place], Modena Nunnery, MD  glucose blood (ACCU-CHEK AVIVA PLUS) test strip USE TO CHECK BLOOD SUGAR FOUR TIMES DAILY AS  DIRECTED 06/11/17  Yes [place], Modena Nunnery, MD  insulin aspart (NOVOLOG FLEXPEN) 100 UNIT/ML FlexPen FOLLOW SLIDING SCALE AS DIRECTED~ inject 5-15 U as directed. 01/27/18  Yes [place], Modena Nunnery, MD  Insulin Glargine (LANTUS SOLOSTAR) 100 UNIT/ML Solostar Pen Inject 36 Units into the skin at bedtime. 01/27/18  Yes [place], Modena Nunnery, MD  Insulin Pen Needle (B-D ULTRAFINE III SHORT PEN) 31G X 8 MM MISC USE AS DIRECTED 06/11/17  Yes [place], Modena Nunnery, MD  lansoprazole  (PREVACID) 30 MG capsule TAKE 1 CAPSULE BY MOUTH TWICE DAILY BEFORE A MEAL 01/27/18  Yes [place], Modena Nunnery, MD  lisinopril (PRINIVIL,ZESTRIL) 10 MG tablet Take 1 tablet (10 mg total) by mouth daily. 06/11/17  Yes [place], Modena Nunnery, MD  metoprolol tartrate (LOPRESSOR) 25 MG tablet Take 0.5 tablets (12.5 mg total) by mouth 2 (two) times daily. 01/27/18  Yes [place], Modena Nunnery, MD  naproxen sodium (ALEVE) 220 MG tablet Take 220 mg by mouth 2 (two) times daily as needed (pain/headache).   Yes [provider]  NOVOLOG FLEXPEN 100 UNIT/ML FlexPen FOLLOW SLIDING SCALE AS DIRECTED 12/21/17  Yes [place], Modena Nunnery, MD  polyvinyl alcohol (ARTIFICIAL TEARS) 1.4 % ophthalmic solution Place 1 drop into both eyes as needed for dry eyes. 10/12/17  Yes [place], Modena Nunnery, MD  pravastatin (PRAVACHOL) 20 MG tablet Take 1 tablet (20 mg total) by mouth daily. 01/27/18  Yes [place], Modena Nunnery, MD  sucralfate (CARAFATE) 1 g tablet Take 1 tablet (1 g total) by mouth 4 (four) times daily. 06/11/17  Yes [place], Modena Nunnery, MD  traMADol (ULTRAM) 50 MG tablet Take 1 tablet (50 mg total) by mouth 2 (two) times daily as needed. Chronic pain 01/27/18  Yes [place], Modena Nunnery, MD  fluticasone [facility]) 50 MCG/ACT nasal spray Place 2 sprays into both nostrils daily. Patient not taking: Reported on 01/27/2018 06/11/17   Alycia Rossetti, MD  furosemide (LASIX) 20 MG tablet Take 1 tablet (20 mg total) by mouth daily. For fluid - spanish speaking 06/11/17   Alycia Rossetti, MD     Allergies  Allergen Reactions  . Bactrim [Sulfamethoxazole-Trimethoprim] Other (See Comments)    Hyperkalemia (elevated potassium)  . Rifampin Other (See Comments)    Severe thrombocytopenia (low blood platelet count)  . Vancomycin Other (See Comments)    Severe thrombocytopenia (low blood platelet count)     Family History  Problem Relation Age of Onset  . Hypertension Mother   . Colon cancer Neg Hx   . Breast cancer Neg Hx      Social History     Socioeconomic History  . Marital status: Widowed    Spouse name: Not on file  . Number of children: 7  . Years of education: Not on file  . Highest education level: Not on file  Occupational History  . Not on file  Social Needs  . Financial resource strain: Not on file  . Food insecurity:    Worry: Not on file    Inability: Not on file  . Transportation needs:    Medical: Not on file    Non-medical: Not on file  Tobacco Use  . Smoking status: Never Smoker  . Smokeless tobacco: Never Used  Substance and Sexual Activity  . Alcohol use: No    Alcohol/week: 0.0 standard drinks  . Drug use: No  . Sexual activity: Never  Lifestyle  . Physical activity:    Days per week: Not on file    Minutes per session:  Not on file  . Stress: Not on file  Relationships  . Social connections:    Talks on phone: Not on file    Gets together: Not on file    Attends religious service: Not on file    Active member of club or organization: Not on file    Attends meetings of clubs or organizations: Not on file    Relationship status: Not on file  . Intimate partner violence:    Fear of current or ex partner: Not on file    Emotionally abused: Not on file    Physically abused: Not on file    Forced sexual activity: Not on file  Other Topics Concern  . Not on file  Social History Narrative   ** Merged History Encounter **         Review of Systems  Constitutional: Negative.   HENT: Negative.   Eyes: Negative.   Respiratory: Negative.   Cardiovascular: Negative.   Gastrointestinal: Negative.   Endocrine: Negative.   Genitourinary: Negative.   Musculoskeletal: Negative.   Skin: Negative.   Allergic/Immunologic: Negative.   Neurological: Negative.   Hematological: Negative.   Psychiatric/Behavioral: Negative.   All other systems reviewed and are negative.      Objective:    Vitals:   04/20/18 1409  BP: 132/72  Pulse: 61  Resp: 16  Temp: 98.5 F (36.9 C)  TempSrc:  Oral  SpO2: 95%  Weight: 159 lb 2 oz (72.2 kg)  Height: '4\' 8"'$  (1.422 m)      Physical Exam Vitals signs and nursing note reviewed.  Constitutional:      Appearance: She is well-developed. She is obese. She is not toxic-appearing.     Comments: Elderly chronically ill appearing female, alert, mildly tachypneic, non-toxic   HENT:     Head: Normocephalic and atraumatic.     Right Ear: External ear normal.     Left Ear: External ear normal.     Nose: Nose normal.     Mouth/Throat:     Mouth: Mucous membranes are moist.     Pharynx: Oropharynx is clear.  Eyes:     General:        Right eye: No discharge.        Left eye: No discharge.     Conjunctiva/sclera: Conjunctivae normal.  Neck:     Trachea: No tracheal deviation.  Cardiovascular:     Rate and Rhythm: Normal rate and regular rhythm.     Pulses: Normal pulses.     Heart sounds: Normal heart sounds.  Pulmonary:     Effort: Pulmonary effort is normal. No respiratory distress.     Breath sounds: No stridor. Wheezing (diffuse inspiratory and expiratory wheeze ) and rhonchi present.  Abdominal:     General: Abdomen is flat. Bowel sounds are normal. There is no distension.     Tenderness: There is no abdominal tenderness. There is no right CVA tenderness, left CVA tenderness, guarding or rebound.  Musculoskeletal: Normal range of motion.  Skin:    General: Skin is warm and dry.     Coloration: Skin is not pale.     Findings: No rash.  Neurological:     Mental Status: She is alert and oriented to person, place, and time.     Motor: No abnormal muscle tone.     Coordination: Coordination normal.     Gait: Gait abnormal.  Psychiatric:        Mood and Affect: Mood normal.  Behavior: Behavior normal.           Assessment & Plan:      ICD-10-CM   1. Urinary tract infection with hematuria, site unspecified N39.0 Urinalysis, Routine w reflex microscopic   R31.9 Urine Culture    Microscopic Message     levofloxacin (LEVAQUIN) 750 MG tablet  2. Acute exacerbation of chronic obstructive pulmonary disease (COPD) (HCC) J44.1 predniSONE (DELTASONE) 20 MG tablet    levofloxacin (LEVAQUIN) 750 MG tablet  3. Vaginal itching N89.8 fluconazole (DIFLUCAN) 150 MG tablet    Urine dip was done while pt ws in office, pt really denies any urinary sx, although her daughter is concerned, with examining her, I was more concerning was her breathing and wheeze - spend a lot of time with the pt's daughter and with interpreter reviewing tx for AECOPD, steroid and to expected rise in blood sugar, how to use inhaler and nebulizer and how often and to use more frequently than her baseline for wheeze, SOB, cough, chest tightness.    Plan was to tx COPD with Zpak and then follow pending urine test, she only complained of itching, tx with one dose diflucan (liver function checked prior to Rx).  To recheck the pt in 3 days.  After pt left clinic the microscopy resulted which was much more consistent with UTI - so Zpak was changed to levaquin to tx both pulm and GU and pharmacy was contacted with changes. Called family phone numbers multiple times but could not be reached.     Delsa Grana, PA-C 04/20/18 2:19 PM

## 2018-04-20 NOTE — Patient Instructions (Signed)
Take mucinex, drink plenty of water.  Take steroids and zpak for the next 5 days  Do breathing treatments with albuterol every 2-4 hours as needed.

## 2018-04-22 LAB — URINE CULTURE
MICRO NUMBER:: 239368
SPECIMEN QUALITY:: ADEQUATE

## 2018-04-23 ENCOUNTER — Other Ambulatory Visit: Payer: Self-pay

## 2018-04-23 ENCOUNTER — Other Ambulatory Visit: Payer: Self-pay | Admitting: Family Medicine

## 2018-04-23 ENCOUNTER — Ambulatory Visit (INDEPENDENT_AMBULATORY_CARE_PROVIDER_SITE_OTHER): Payer: Medicaid Other | Admitting: Family Medicine

## 2018-04-23 ENCOUNTER — Encounter: Payer: Self-pay | Admitting: Family Medicine

## 2018-04-23 VITALS — BP 122/68 | HR 78 | Temp 99.1°F | Resp 14 | Ht <= 58 in | Wt 160.0 lb

## 2018-04-23 DIAGNOSIS — N3000 Acute cystitis without hematuria: Secondary | ICD-10-CM

## 2018-04-23 DIAGNOSIS — J441 Chronic obstructive pulmonary disease with (acute) exacerbation: Secondary | ICD-10-CM | POA: Diagnosis not present

## 2018-04-23 MED ORDER — FLUTICASONE PROPIONATE 50 MCG/ACT NA SUSP: 2.0000 | Freq: Every day | NASAL | 1 refills | Status: DC

## 2018-04-23 MED ORDER — PREDNISONE 20 MG PO TABS: 40.0000 mg | ORAL_TABLET | Freq: Every day | ORAL | 0 refills | Status: AC

## 2018-04-23 MED ORDER — BENZONATATE 100 MG PO CAPS: 100.0000 mg | ORAL_CAPSULE | Freq: Three times a day (TID) | ORAL | 0 refills | Status: DC | PRN

## 2018-04-23 MED ORDER — ALBUTEROL SULFATE (2.5 MG/3ML) 0.083% IN NEBU: 2.5000 mg | INHALATION_SOLUTION | Freq: Four times a day (QID) | RESPIRATORY_TRACT | 1 refills | Status: DC | PRN

## 2018-04-23 MED ORDER — NITROFURANTOIN MONOHYD MACRO 100 MG PO CAPS: 100.0000 mg | ORAL_CAPSULE | Freq: Two times a day (BID) | ORAL | 0 refills | Status: DC

## 2018-04-23 MED ORDER — AZITHROMYCIN 250 MG PO TABS: ORAL_TABLET | ORAL | 0 refills | Status: DC

## 2018-04-23 NOTE — Patient Instructions (Addendum)
Use flonase- nasal spray Use Tessalon perrles for cough Take prednisone for 5 days- steroids ( for lungs/wheezing/bronchitis) Use nebulizer or inhaler( albuterol) every 4 hours as needed for cough, wheezing, shortness of breath Take antibiotics Azithromycin ( for bronchitis) and Macrobid ( for urine infection) F/U as previous

## 2018-04-23 NOTE — Progress Notes (Signed)
   Subjective:    Patient ID: Joyce Gilmore, female    DOB: [birth year], 84 y.o.   MRN: [id number]  Patient presents for Follow-up (UA C/S)  Interpreter and daughter present  Pt here for recheck, seen on Wed 25th by my PA. States she had had cough with congestion, wheezing for a few days. No fever. There was also some questionable UTI symptoms  She was initially prescribed prednisone, zpak and diflucan for presumed yeast infection. UA resulted after the visit and antibiotic changed to levaquin, but they never received any information about this and was awaiting a phone call before going to pharmacy so she has only been using the nebuluzer and has not had any other meds.   No sick contacts within the home, no recent travel  No vomiting, diarrhea, no body aches  Breathing has improved since she has been using the nebulizer  Denies any vaginal itching or burning with urination but does have a mild hurt and points near suprapubic region    Review Of Systems:  GEN- denies fatigue, fever, weight loss,weakness, recent illness HEENT- denies eye drainage, change in vision, nasal discharge, CVS- denies chest pain, palpitations RESP- denies SOB,+ cough,+ wheeze ABD- denies N/V, change in stools, abd pain GU- denies dysuria, hematuria, dribbling, incontinence MSK- denies joint pain, muscle aches, injury Neuro- denies headache, dizziness, syncope, seizure activity       Objective:    BP 122/68   Pulse 78   Temp 99.1 F (37.3 C) (Oral)   Resp 14   Ht 4\' 8"  (1.422 m)   Wt 160 lb (72.6 kg)   SpO2 94%   BMI 35.87 kg/m  GEN- NAD, alert and oriented x3 HEENT- PERRL, EOMI, non injected sclera, pink conjunctiva, MMM, oropharynx clear, nares clear rhinorrhea, enlarged turbinates, no maxillary sinus tenderness, TM clear no effusion  Neck- Supple, no LAD  CVS- RRR, no murmur RESP-congestion bilat, no wheeze, normal WOB at rest  ABD-NABS,soft, mild TTP suprapubic region,ND, no CVA tenderness EXT- No  edema Pulses- Radial, DP- 2+        Assessment & Plan:     We called and verified meds with pharmacy  Time spent to discuss with pt, daughter and interpreter her medications and which to pick up today    Problem List Items Addressed This Visit    None    Visit Diagnoses    Acute cystitis without hematuria    -  Primary   E coli with multiple resistance, treat with macrobid, as potassium already borderline high.    Acute exacerbation of chronic obstructive pulmonary disease (COPD) (HCC)       Treat with prednisone, azithromycin, flonase, albuterol neb, tessalon perrles    Relevant Medications   albuterol (PROVENTIL) (2.5 MG/3ML) 0.083% nebulizer solution   fluticasone (FLONASE) 50 MCG/ACT nasal spray   predniSONE (DELTASONE) 20 MG tablet   benzonatate (TESSALON) 100 MG capsule   azithromycin (ZITHROMAX) 250 MG tablet      Note: This dictation was prepared with Dragon dictation along with smaller phrase technology. Any transcriptional errors that result from this process are unintentional.

## 2018-04-25 ENCOUNTER — Encounter: Payer: Self-pay | Admitting: Family Medicine

## 2018-04-26 ENCOUNTER — Encounter: Payer: Self-pay | Admitting: Family Medicine

## 2018-04-29 DIAGNOSIS — E1122 Type 2 diabetes mellitus with diabetic chronic kidney disease: Secondary | ICD-10-CM | POA: Diagnosis not present

## 2018-05-03 ENCOUNTER — Ambulatory Visit: Payer: Medicaid Other | Admitting: Family Medicine

## 2018-05-03 ENCOUNTER — Other Ambulatory Visit: Payer: Self-pay

## 2018-05-03 ENCOUNTER — Encounter: Payer: Self-pay | Admitting: Family Medicine

## 2018-05-03 VITALS — BP 124/68 | HR 60 | Temp 98.6°F | Resp 16 | Ht <= 58 in | Wt 161.0 lb

## 2018-05-03 DIAGNOSIS — J4540 Moderate persistent asthma, uncomplicated: Secondary | ICD-10-CM | POA: Diagnosis not present

## 2018-05-03 DIAGNOSIS — E7849 Other hyperlipidemia: Secondary | ICD-10-CM | POA: Diagnosis not present

## 2018-05-03 DIAGNOSIS — E1122 Type 2 diabetes mellitus with diabetic chronic kidney disease: Secondary | ICD-10-CM

## 2018-05-03 DIAGNOSIS — N390 Urinary tract infection, site not specified: Secondary | ICD-10-CM | POA: Diagnosis not present

## 2018-05-03 DIAGNOSIS — Z974 Presence of external hearing-aid: Secondary | ICD-10-CM

## 2018-05-03 DIAGNOSIS — I10 Essential (primary) hypertension: Secondary | ICD-10-CM

## 2018-05-03 DIAGNOSIS — Z794 Long term (current) use of insulin: Secondary | ICD-10-CM | POA: Diagnosis not present

## 2018-05-03 DIAGNOSIS — N182 Chronic kidney disease, stage 2 (mild): Secondary | ICD-10-CM | POA: Diagnosis not present

## 2018-05-03 DIAGNOSIS — I5032 Chronic diastolic (congestive) heart failure: Secondary | ICD-10-CM

## 2018-05-03 NOTE — Progress Notes (Signed)
Subjective:    Patient ID: Joyce Gilmore, female    DOB: [birth year], 84 y.o.   MRN: [id number]  Patient presents for Follow-up (URI); Diabetes; and Hypertension    Interpreter present  Pt here to f/u visit from 2/28, diagnosed with UTI E coli with multiple resistant bugs, given macrobid   Bronchitis- treated with azithromycin, prednisone, albuterol nebs, tessalon perrles /flonase    Continued on arnuity ( fluticasone inhaled)   DM- last A1C in December 7.6%, taking Novlog 6 units with meals and Lantus 38units  She did not bring her meter with her today.    Due for lipid panel - on pravastatin , last LDL 71 in April 2019  HTN/ Heart failure - tolerating norvasc , metoprolol, lasix , lisinopril   She today she is with her daughter who typically brings her.  States that she continues to have cough but is clear mucus she has not had any fever she occasionally still uses a nebulizer.  He still using the [employer] which helped.  Denies any shortness of breath or chest pain.  Of note her UTI symptoms have also resolved   Review Of Systems:  GEN- denies fatigue, fever, weight loss,weakness, recent illness HEENT- denies eye drainage, change in vision, nasal discharge, CVS- denies chest pain, palpitations RESP- denies SOB,+ cough, wheeze ABD- denies N/V, change in stools, abd pain GU- denies dysuria, hematuria, dribbling, incontinence MSK- denies joint pain, muscle aches, injury Neuro- denies headache, dizziness, syncope, seizure activity       Objective:    BP 124/68   Pulse 60   Temp 98.6 F (37 C) (Oral)   Resp 16   Ht 4\' 8"  (1.422 m)   Wt 161 lb (73 kg)   SpO2 96%   BMI 36.10 kg/m  GEN- NAD, alert and oriented x3 HEENT- PERRL, EOMI, non injected sclera, pink conjunctiva, MMM, oropharynx clear, nares clear rhinorrhea, no sinus tenderness Neck- Supple, no thyromegaly, no lymphadenopathy CVS- RRR, no murmur RESP-CTAB ABD-NABS,soft,NT,ND, no CVA tenderness  EXT- No  edema Pulses- Radial, DP- 2+,calus feet bilat         Assessment & Plan:      Problem List Items Addressed This Visit      Unprioritized   Asthmatic bronchitis    She is nearing her baseline but still has some residual cough.  Her oxygen sats look good.  Continue the albuterol only as needed for wheezing shortness of breath discussed this again with the interpreter and her family members.  Continue Tessalon Perles for cough as needed.  Overall much improved Continue her fluticasone inhaled daily      Chronic diastolic heart failure ([place]) - Primary    She is compensated with regards to fluid status continue the Lasix.  We will check her renal function today.      Essential hypertension    Blood pressure is well controlled no change in medication.      HLD (hyperlipidemia)   Relevant Orders   Lipid panel   Type 2 diabetes mellitus with diabetic chronic kidney disease ([place])    Goal is A1c less than 8% with no hypoglycemia symptoms.  Continue Lantus she is on 38 units along with NovoLog 6 units.  She is on ACE inhibitor and statin drug      Relevant Orders   CBC with Differential/Platelet   Comprehensive metabolic panel   Hemoglobin A1c   Uses hearing aid    Other Visit Diagnoses    Urinary  tract infection without hematuria, site unspecified       Recheck urine culture as she has significant resistant bacteria she is not symptomatic   Relevant Orders   Urine Culture      Note: This dictation was prepared with Dragon dictation along with smaller phrase technology. Any transcriptional errors that result from this process are unintentional.

## 2018-05-03 NOTE — Assessment & Plan Note (Signed)
Goal is A1c less than 8% with no hypoglycemia symptoms.  Continue Lantus she is on 38 units along with NovoLog 6 units.  She is on ACE inhibitor and statin drug

## 2018-05-03 NOTE — Assessment & Plan Note (Signed)
Blood pressure is well-controlled no change in medication 

## 2018-05-03 NOTE — Assessment & Plan Note (Addendum)
She is nearing her baseline but still has some residual cough.  Her oxygen sats look good.  Continue the albuterol only as needed for wheezing shortness of breath discussed this again with the interpreter and her family members.  Continue Tessalon Perles for cough as needed.  Overall much improved Continue her fluticasone inhaled daily

## 2018-05-03 NOTE — Assessment & Plan Note (Signed)
She is compensated with regards to fluid status continue the Lasix.  We will check her renal function today.

## 2018-05-03 NOTE — Patient Instructions (Addendum)
F/U 4 months wellness exam We will call with lab results  Continue the tessalon perrles for cough Continue inhaler

## 2018-05-04 LAB — HEMOGLOBIN A1C
EAG (MMOL/L): 11.2 (calc)
Hgb A1c MFr Bld: 8.7 % of total Hgb — ABNORMAL HIGH (ref <5.7)
Mean Plasma Glucose: 203 (calc)

## 2018-05-04 LAB — CBC WITH DIFFERENTIAL/PLATELET
Absolute Monocytes: 802 cells/uL (ref 200–950)
Basophils Absolute: 41 cells/uL (ref 0–200)
Basophils Relative: 0.5 %
Eosinophils Absolute: 227 cells/uL (ref 15–500)
Eosinophils Relative: 2.8 %
HCT: 40.2 % (ref 35.0–45.0)
Hemoglobin: 13 g/dL (ref 11.7–15.5)
Lymphs Abs: 2228 cells/uL (ref 850–3900)
MCH: 26.9 pg — ABNORMAL LOW (ref 27.0–33.0)
MCHC: 32.3 g/dL (ref 32.0–36.0)
MCV: 83.1 fL (ref 80.0–100.0)
MPV: 10.7 fL (ref 7.5–12.5)
Monocytes Relative: 9.9 %
Neutro Abs: 4803 cells/uL (ref 1500–7800)
Neutrophils Relative %: 59.3 %
Platelets: 242 10*3/uL (ref 140–400)
RBC: 4.84 10*6/uL (ref 3.80–5.10)
RDW: 14.2 % (ref 11.0–15.0)
TOTAL LYMPHOCYTE: 27.5 %
WBC: 8.1 10*3/uL (ref 3.8–10.8)

## 2018-05-04 LAB — COMPREHENSIVE METABOLIC PANEL
AG Ratio: 1.4 (calc) (ref 1.0–2.5)
ALKALINE PHOSPHATASE (APISO): 74 U/L (ref 37–153)
ALT: 20 U/L (ref 6–29)
AST: 27 U/L (ref 10–35)
Albumin: 3.9 g/dL (ref 3.6–5.1)
BUN/Creatinine Ratio: 12 (calc) (ref 6–22)
BUN: 12 mg/dL (ref 7–25)
CO2: 27 mmol/L (ref 20–32)
CREATININE: 0.98 mg/dL — AB (ref 0.60–0.88)
Calcium: 9.8 mg/dL (ref 8.6–10.4)
Chloride: 102 mmol/L (ref 98–110)
Globulin: 2.7 g/dL (calc) (ref 1.9–3.7)
Glucose, Bld: 82 mg/dL (ref 65–99)
Potassium: 5.2 mmol/L (ref 3.5–5.3)
Sodium: 139 mmol/L (ref 135–146)
Total Bilirubin: 0.3 mg/dL (ref 0.2–1.2)
Total Protein: 6.6 g/dL (ref 6.1–8.1)

## 2018-05-04 LAB — LIPID PANEL
Cholesterol: 169 mg/dL (ref <200)
HDL: 61 mg/dL (ref >=50)
LDL Cholesterol (Calc): 71 mg/dL (calc)
Non-HDL Cholesterol (Calc): 108 mg/dL (calc) (ref <130)
Total CHOL/HDL Ratio: 2.8 (calc) (ref <5.0)
Triglycerides: 279 mg/dL — ABNORMAL HIGH (ref <150)

## 2018-05-06 LAB — URINE CULTURE
MICRO NUMBER:: 293659
SPECIMEN QUALITY:: ADEQUATE

## 2018-05-07 ENCOUNTER — Inpatient Hospital Stay (HOSPITAL_COMMUNITY)
Admission: EM | Admit: 2018-05-07 | Discharge: 2018-05-11 | DRG: 291 | Disposition: A | Discharge status: Home / Self Care | Payer: Medicaid Other | Attending: Internal Medicine | Admitting: Internal Medicine

## 2018-05-07 ENCOUNTER — Other Ambulatory Visit: Payer: Self-pay

## 2018-05-07 ENCOUNTER — Encounter (HOSPITAL_COMMUNITY): Payer: Self-pay | Admitting: Emergency Medicine

## 2018-05-07 ENCOUNTER — Emergency Department (HOSPITAL_COMMUNITY): Payer: Medicaid Other

## 2018-05-07 DIAGNOSIS — H9313 Tinnitus, bilateral: Secondary | ICD-10-CM | POA: Diagnosis present

## 2018-05-07 DIAGNOSIS — Z23 Encounter for immunization: Secondary | ICD-10-CM

## 2018-05-07 DIAGNOSIS — M6740 Ganglion, unspecified site: Secondary | ICD-10-CM | POA: Diagnosis present

## 2018-05-07 DIAGNOSIS — Z7951 Long term (current) use of inhaled steroids: Secondary | ICD-10-CM

## 2018-05-07 DIAGNOSIS — R0602 Shortness of breath: Secondary | ICD-10-CM

## 2018-05-07 DIAGNOSIS — I5033 Acute on chronic diastolic (congestive) heart failure: Secondary | ICD-10-CM | POA: Diagnosis present

## 2018-05-07 DIAGNOSIS — Z79899 Other long term (current) drug therapy: Secondary | ICD-10-CM

## 2018-05-07 DIAGNOSIS — Z1612 Extended spectrum beta lactamase (ESBL) resistance: Secondary | ICD-10-CM | POA: Diagnosis not present

## 2018-05-07 DIAGNOSIS — F329 Major depressive disorder, single episode, unspecified: Secondary | ICD-10-CM | POA: Diagnosis present

## 2018-05-07 DIAGNOSIS — I509 Heart failure, unspecified: Secondary | ICD-10-CM

## 2018-05-07 DIAGNOSIS — E872 Acidosis, unspecified: Secondary | ICD-10-CM

## 2018-05-07 DIAGNOSIS — Z8249 Family history of ischemic heart disease and other diseases of the circulatory system: Secondary | ICD-10-CM | POA: Diagnosis not present

## 2018-05-07 DIAGNOSIS — J45909 Unspecified asthma, uncomplicated: Secondary | ICD-10-CM | POA: Diagnosis present

## 2018-05-07 DIAGNOSIS — N189 Chronic kidney disease, unspecified: Secondary | ICD-10-CM

## 2018-05-07 DIAGNOSIS — E669 Obesity, unspecified: Secondary | ICD-10-CM | POA: Diagnosis present

## 2018-05-07 DIAGNOSIS — E785 Hyperlipidemia, unspecified: Secondary | ICD-10-CM | POA: Diagnosis present

## 2018-05-07 DIAGNOSIS — I272 Pulmonary hypertension, unspecified: Secondary | ICD-10-CM | POA: Diagnosis present

## 2018-05-07 DIAGNOSIS — J9601 Acute respiratory failure with hypoxia: Secondary | ICD-10-CM | POA: Diagnosis present

## 2018-05-07 DIAGNOSIS — R42 Dizziness and giddiness: Secondary | ICD-10-CM

## 2018-05-07 DIAGNOSIS — I110 Hypertensive heart disease with heart failure: Secondary | ICD-10-CM | POA: Diagnosis not present

## 2018-05-07 DIAGNOSIS — Z794 Long term (current) use of insulin: Secondary | ICD-10-CM | POA: Diagnosis not present

## 2018-05-07 DIAGNOSIS — Z1624 Resistance to multiple antibiotics: Secondary | ICD-10-CM | POA: Diagnosis present

## 2018-05-07 DIAGNOSIS — E1122 Type 2 diabetes mellitus with diabetic chronic kidney disease: Secondary | ICD-10-CM | POA: Diagnosis present

## 2018-05-07 DIAGNOSIS — Z8614 Personal history of Methicillin resistant Staphylococcus aureus infection: Secondary | ICD-10-CM

## 2018-05-07 DIAGNOSIS — N182 Chronic kidney disease, stage 2 (mild): Secondary | ICD-10-CM | POA: Diagnosis not present

## 2018-05-07 DIAGNOSIS — Z6834 Body mass index (BMI) 34.0-34.9, adult: Secondary | ICD-10-CM | POA: Diagnosis not present

## 2018-05-07 DIAGNOSIS — I10 Essential (primary) hypertension: Secondary | ICD-10-CM | POA: Diagnosis not present

## 2018-05-07 DIAGNOSIS — H9193 Unspecified hearing loss, bilateral: Secondary | ICD-10-CM | POA: Diagnosis present

## 2018-05-07 DIAGNOSIS — Z79891 Long term (current) use of opiate analgesic: Secondary | ICD-10-CM

## 2018-05-07 DIAGNOSIS — J9811 Atelectasis: Secondary | ICD-10-CM | POA: Diagnosis present

## 2018-05-07 DIAGNOSIS — M810 Age-related osteoporosis without current pathological fracture: Secondary | ICD-10-CM | POA: Diagnosis present

## 2018-05-07 DIAGNOSIS — J4540 Moderate persistent asthma, uncomplicated: Secondary | ICD-10-CM | POA: Diagnosis not present

## 2018-05-07 DIAGNOSIS — B9629 Other Escherichia coli [E. coli] as the cause of diseases classified elsewhere: Secondary | ICD-10-CM

## 2018-05-07 DIAGNOSIS — Z7982 Long term (current) use of aspirin: Secondary | ICD-10-CM

## 2018-05-07 DIAGNOSIS — Z87442 Personal history of urinary calculi: Secondary | ICD-10-CM

## 2018-05-07 DIAGNOSIS — I130 Hypertensive heart and chronic kidney disease with heart failure and stage 1 through stage 4 chronic kidney disease, or unspecified chronic kidney disease: Principal | ICD-10-CM | POA: Diagnosis present

## 2018-05-07 DIAGNOSIS — N390 Urinary tract infection, site not specified: Secondary | ICD-10-CM | POA: Diagnosis present

## 2018-05-07 DIAGNOSIS — B9620 Unspecified Escherichia coli [E. coli] as the cause of diseases classified elsewhere: Secondary | ICD-10-CM | POA: Diagnosis present

## 2018-05-07 DIAGNOSIS — N10 Acute tubulo-interstitial nephritis: Secondary | ICD-10-CM | POA: Diagnosis present

## 2018-05-07 DIAGNOSIS — N183 Chronic kidney disease, stage 3 (moderate): Secondary | ICD-10-CM | POA: Diagnosis present

## 2018-05-07 DIAGNOSIS — R079 Chest pain, unspecified: Secondary | ICD-10-CM | POA: Diagnosis present

## 2018-05-07 DIAGNOSIS — J441 Chronic obstructive pulmonary disease with (acute) exacerbation: Secondary | ICD-10-CM | POA: Diagnosis present

## 2018-05-07 DIAGNOSIS — Z87440 Personal history of urinary (tract) infections: Secondary | ICD-10-CM

## 2018-05-07 DIAGNOSIS — J189 Pneumonia, unspecified organism: Secondary | ICD-10-CM

## 2018-05-07 LAB — COMPREHENSIVE METABOLIC PANEL
ALT: 20 U/L (ref 0–44)
AST: 23 U/L (ref 15–41)
Albumin: 3.5 g/dL (ref 3.5–5.0)
Alkaline Phosphatase: 69 U/L (ref 38–126)
Anion gap: 7 (ref 5–15)
BUN: 16 mg/dL (ref 8–23)
CO2: 25 mmol/L (ref 22–32)
Calcium: 9.3 mg/dL (ref 8.9–10.3)
Chloride: 102 mmol/L (ref 98–111)
Creatinine, Ser: 1 mg/dL (ref 0.44–1.00)
GFR calc Af Amer: >60 mL/min (ref >60)
GFR calc non Af Amer: 53 mL/min — ABNORMAL LOW (ref >60)
Glucose, Bld: 187 mg/dL — ABNORMAL HIGH (ref 70–99)
Potassium: 4.5 mmol/L (ref 3.5–5.1)
Sodium: 134 mmol/L — ABNORMAL LOW (ref 135–145)
Total Bilirubin: 0.5 mg/dL (ref 0.3–1.2)
Total Protein: 6.2 g/dL — ABNORMAL LOW (ref 6.5–8.1)

## 2018-05-07 LAB — CBC WITH DIFFERENTIAL/PLATELET
Abs Immature Granulocytes: 0.03 10*3/uL (ref 0.00–0.07)
Basophils Absolute: 0 10*3/uL (ref 0.0–0.1)
Basophils Relative: 1 %
Eosinophils Absolute: 0.1 10*3/uL (ref 0.0–0.5)
Eosinophils Relative: 2 %
HEMATOCRIT: 36.2 % (ref 36.0–46.0)
Hemoglobin: 12.1 g/dL (ref 12.0–15.0)
Immature Granulocytes: 1 %
LYMPHS ABS: 1.8 10*3/uL (ref 0.7–4.0)
Lymphocytes Relative: 28 %
MCH: 27.8 pg (ref 26.0–34.0)
MCHC: 33.4 g/dL (ref 30.0–36.0)
MCV: 83 fL (ref 80.0–100.0)
Monocytes Absolute: 0.6 10*3/uL (ref 0.1–1.0)
Monocytes Relative: 10 %
Neutro Abs: 3.8 10*3/uL (ref 1.7–7.7)
Neutrophils Relative %: 58 %
Platelets: 195 10*3/uL (ref 150–400)
RBC: 4.36 MIL/uL (ref 3.87–5.11)
RDW: 15.4 % (ref 11.5–15.5)
WBC: 6.4 10*3/uL (ref 4.0–10.5)
nRBC: 0 % (ref 0.0–0.2)

## 2018-05-07 LAB — URINALYSIS, ROUTINE W REFLEX MICROSCOPIC
Bilirubin Urine: NEGATIVE
Glucose, UA: NEGATIVE mg/dL
Hgb urine dipstick: NEGATIVE
Ketones, ur: NEGATIVE mg/dL
Nitrite: POSITIVE — AB
Protein, ur: NEGATIVE mg/dL
Specific Gravity, Urine: 1.006 (ref 1.005–1.030)
WBC, UA: >50 WBC/hpf — ABNORMAL HIGH (ref 0–5)
pH: 6 (ref 5.0–8.0)

## 2018-05-07 LAB — GLUCOSE, CAPILLARY
Glucose-Capillary: 141 mg/dL — ABNORMAL HIGH (ref 70–99)
Glucose-Capillary: 283 mg/dL — ABNORMAL HIGH (ref 70–99)

## 2018-05-07 LAB — LACTIC ACID, PLASMA
Lactic Acid, Venous: 2.2 mmol/L (ref 0.5–1.9)
Lactic Acid, Venous: 1.3 mmol/L (ref 0.5–1.9)

## 2018-05-07 LAB — BRAIN NATRIURETIC PEPTIDE: B Natriuretic Peptide: 50.1 pg/mL (ref 0.0–100.0)

## 2018-05-07 MED ORDER — BENZONATATE 100 MG PO CAPS: 100.0000 mg | ORAL_CAPSULE | Freq: Three times a day (TID) | ORAL | Status: DC | PRN

## 2018-05-07 MED ORDER — UMECLIDINIUM-VILANTEROL 62.5-25 MCG/INH IN AEPB
1.0000 | INHALATION_SPRAY | Freq: Every day | RESPIRATORY_TRACT | Status: DC
  Administered 2018-05-08 – 2018-05-11 (×4): 1 via RESPIRATORY_TRACT
  Filled 2018-05-07: qty 14

## 2018-05-07 MED ORDER — AMLODIPINE BESYLATE 2.5 MG PO TABS
5.0000 mg | ORAL_TABLET | Freq: Every day | ORAL | Status: DC
  Administered 2018-05-07 – 2018-05-11 (×5): 5 mg via ORAL
  Filled 2018-05-07 (×5): qty 2

## 2018-05-07 MED ORDER — PANTOPRAZOLE SODIUM 40 MG PO TBEC
40.0000 mg | DELAYED_RELEASE_TABLET | Freq: Every day | ORAL | Status: DC
  Administered 2018-05-08 – 2018-05-11 (×4): 40 mg via ORAL
  Filled 2018-05-07 (×4): qty 1

## 2018-05-07 MED ORDER — PRAVASTATIN SODIUM 10 MG PO TABS
20.0000 mg | ORAL_TABLET | Freq: Every day | ORAL | Status: DC
  Administered 2018-05-08 – 2018-05-11 (×4): 20 mg via ORAL
  Filled 2018-05-07 (×6): qty 2

## 2018-05-07 MED ORDER — INSULIN ASPART 100 UNIT/ML ~~LOC~~ SOLN
0.0000 [IU] | Freq: Three times a day (TID) | SUBCUTANEOUS | Status: DC
  Administered 2018-05-08: 5 [IU] via SUBCUTANEOUS
  Administered 2018-05-08: 7 [IU] via SUBCUTANEOUS
  Administered 2018-05-08: 1 [IU] via SUBCUTANEOUS
  Administered 2018-05-09: 2 [IU] via SUBCUTANEOUS
  Administered 2018-05-09: 1 [IU] via SUBCUTANEOUS
  Administered 2018-05-10 (×2): 2 [IU] via SUBCUTANEOUS
  Administered 2018-05-10: 3 [IU] via SUBCUTANEOUS
  Administered 2018-05-11: 2 [IU] via SUBCUTANEOUS
  Administered 2018-05-11: 3 [IU] via SUBCUTANEOUS

## 2018-05-07 MED ORDER — BUDESONIDE 0.25 MG/2ML IN SUSP
0.2500 mg | Freq: Two times a day (BID) | RESPIRATORY_TRACT | Status: DC
  Administered 2018-05-08 – 2018-05-11 (×7): 0.25 mg via RESPIRATORY_TRACT
  Filled 2018-05-07 (×7): qty 2

## 2018-05-07 MED ORDER — INSULIN GLARGINE 100 UNIT/ML ~~LOC~~ SOLN
36.0000 [IU] | Freq: Every day | SUBCUTANEOUS | Status: DC
  Administered 2018-05-07 – 2018-05-08 (×2): 36 [IU] via SUBCUTANEOUS
  Filled 2018-05-07 (×3): qty 0.36

## 2018-05-07 MED ORDER — PNEUMOCOCCAL VAC POLYVALENT 25 MCG/0.5ML IJ INJ
0.5000 mL | INJECTION | INTRAMUSCULAR | Status: AC
  Administered 2018-05-08: 0.5 mL via INTRAMUSCULAR
  Filled 2018-05-07: qty 0.5

## 2018-05-07 MED ORDER — IPRATROPIUM-ALBUTEROL 0.5-2.5 (3) MG/3ML IN SOLN
3.0000 mL | Freq: Once | RESPIRATORY_TRACT | Status: AC
  Administered 2018-05-07: 3 mL via RESPIRATORY_TRACT
  Filled 2018-05-07: qty 3

## 2018-05-07 MED ORDER — LISINOPRIL 10 MG PO TABS
10.0000 mg | ORAL_TABLET | Freq: Every day | ORAL | Status: DC
  Administered 2018-05-08 – 2018-05-11 (×4): 10 mg via ORAL
  Filled 2018-05-07 (×4): qty 1

## 2018-05-07 MED ORDER — POLYVINYL ALCOHOL 1.4 % OP SOLN
1.0000 [drp] | OPHTHALMIC | Status: DC | PRN
  Filled 2018-05-07: qty 15

## 2018-05-07 MED ORDER — INSULIN ASPART 100 UNIT/ML ~~LOC~~ SOLN: 6.0000 [IU] | Freq: Three times a day (TID) | SUBCUTANEOUS | Status: DC

## 2018-05-07 MED ORDER — INSULIN ASPART 100 UNIT/ML FLEXPEN: 5.0000 [IU] | PEN_INJECTOR | Freq: Three times a day (TID) | SUBCUTANEOUS | Status: DC

## 2018-05-07 MED ORDER — FLUTICASONE FUROATE 100 MCG/ACT IN AEPB: 1.0000 | INHALATION_SPRAY | Freq: Every day | RESPIRATORY_TRACT | Status: DC

## 2018-05-07 MED ORDER — ASPIRIN EC 81 MG PO TBEC
81.0000 mg | DELAYED_RELEASE_TABLET | Freq: Every day | ORAL | Status: DC
  Administered 2018-05-08 – 2018-05-11 (×4): 81 mg via ORAL
  Filled 2018-05-07 (×4): qty 1

## 2018-05-07 MED ORDER — ENOXAPARIN SODIUM 40 MG/0.4ML ~~LOC~~ SOLN
40.0000 mg | SUBCUTANEOUS | Status: DC
  Administered 2018-05-07 – 2018-05-10 (×4): 40 mg via SUBCUTANEOUS
  Filled 2018-05-07 (×4): qty 0.4

## 2018-05-07 MED ORDER — FUROSEMIDE 10 MG/ML IJ SOLN
20.0000 mg | Freq: Once | INTRAMUSCULAR | Status: AC
  Administered 2018-05-07: 20 mg via INTRAVENOUS
  Filled 2018-05-07: qty 2

## 2018-05-07 MED ORDER — METOPROLOL TARTRATE 12.5 MG HALF TABLET
12.5000 mg | ORAL_TABLET | Freq: Two times a day (BID) | ORAL | Status: DC
  Administered 2018-05-07 – 2018-05-11 (×8): 12.5 mg via ORAL
  Filled 2018-05-07 (×8): qty 1

## 2018-05-07 MED ORDER — FOSFOMYCIN TROMETHAMINE 3 G PO PACK
3.0000 g | PACK | Freq: Once | ORAL | Status: AC
  Administered 2018-05-07: 3 g via ORAL
  Filled 2018-05-07: qty 3

## 2018-05-07 MED ORDER — ACETAMINOPHEN 325 MG PO TABS: 325.0000 mg | ORAL_TABLET | Freq: Four times a day (QID) | ORAL | Status: DC | PRN

## 2018-05-07 MED ORDER — FLUTICASONE PROPIONATE 50 MCG/ACT NA SUSP
2.0000 | Freq: Every day | NASAL | Status: DC
  Administered 2018-05-07 – 2018-05-11 (×5): 2 via NASAL
  Filled 2018-05-07: qty 16

## 2018-05-07 MED ORDER — SUCRALFATE 1 G PO TABS
1.0000 g | ORAL_TABLET | Freq: Four times a day (QID) | ORAL | Status: DC
  Administered 2018-05-07 – 2018-05-11 (×15): 1 g via ORAL
  Filled 2018-05-07 (×15): qty 1

## 2018-05-07 MED ORDER — TRAMADOL HCL 50 MG PO TABS: 50.0000 mg | ORAL_TABLET | Freq: Two times a day (BID) | ORAL | Status: DC | PRN

## 2018-05-07 MED ORDER — INSULIN ASPART 100 UNIT/ML ~~LOC~~ SOLN
6.0000 [IU] | Freq: Three times a day (TID) | SUBCUTANEOUS | Status: DC
  Administered 2018-05-08 – 2018-05-09 (×5): 6 [IU] via SUBCUTANEOUS

## 2018-05-07 MED ORDER — PREDNISONE 20 MG PO TABS
40.0000 mg | ORAL_TABLET | Freq: Every day | ORAL | Status: DC
  Administered 2018-05-08: 40 mg via ORAL
  Filled 2018-05-07: qty 2

## 2018-05-07 MED ORDER — ALBUTEROL SULFATE (2.5 MG/3ML) 0.083% IN NEBU: 3.0000 mL | INHALATION_SOLUTION | RESPIRATORY_TRACT | Status: DC | PRN

## 2018-05-07 MED ORDER — FUROSEMIDE 20 MG PO TABS
20.0000 mg | ORAL_TABLET | Freq: Every day | ORAL | Status: DC
  Administered 2018-05-08 – 2018-05-11 (×4): 20 mg via ORAL
  Filled 2018-05-07 (×4): qty 1

## 2018-05-07 NOTE — ED Triage Notes (Signed)
Pt was sent by her PCP for a urine tract infection that did not respond to oral antibiotics.

## 2018-05-07 NOTE — ED Notes (Signed)
ED TO INPATIENT HANDOFF REPORT  ED Nurse Name and Phone #:  Chong Sicilian, RN [phone]  S Name/Age/Gender Joyce Gilmore 84 y.o. female Room/Bed: 023C/023C  Code Status   Code Status: Prior  Home/SNF/Other Home Patient oriented to: self, place, time and situation Is this baseline? Yes   Triage Complete: Triage complete  Chief Complaint Sent by Dr/Infection in urine  Triage Note Pt was sent by her PCP for a urine tract infection that did not respond to oral antibiotics.    Allergies Allergies  Allergen Reactions  . Bactrim [Sulfamethoxazole-Trimethoprim] Other (See Comments)    Hyperkalemia (elevated potassium)  . Rifampin Other (See Comments)    Severe thrombocytopenia (low blood platelet count)  . Vancomycin Other (See Comments)    Severe thrombocytopenia (low blood platelet count)    Level of Care/Admitting Diagnosis ED Disposition    ED Disposition Condition Comment   Admit  Hospital Area: [facility] [[id number]]  Level of Care: Telemetry Medical [104]  Diagnosis: CHF (congestive heart failure) [facility]) [[id number]]  Admitting Physician: Nita Sells [phone]  Attending Physician: Nita Sells [phone]  Estimated length of stay: 3 - 4 days  Certification:: I certify this patient will need inpatient services for at least 2 midnights  PT Class (Do Not Modify): Inpatient [101]  PT Acc Code (Do Not Modify): Private [1]       B Medical/Surgery History Past Medical History:  Diagnosis Date  . Arthritis   . CHF (congestive heart failure) (H[facility])    diastolic  . Chronic kidney disease    hx of kidney stones,   . Depression    hx of   . Diabetes mellitus without complication ([place])   . Ganglion, left ankle and foot   . Hearing loss of both ears   . Hypertension   . MRSA infection    Oct 13 - Nov 13  . Osteoporosis   . Tinnitus of both ears    Past Surgical History:  Procedure Laterality Date  . BACK SURGERY     history restored after error with  record merge      . INCISION AND DRAINAGE HIP  12/22/2011   Procedure: IRRIGATION AND DEBRIDEMENT HIP;  Surgeon: Mcarthur Rossetti, MD;  Location: [facility];  Service: Orthopedics;  Laterality: Right;  Irrigation and debridement right hip  . INCISION AND DRAINAGE HIP  12/27/2011   Procedure: IRRIGATION AND DEBRIDEMENT HIP;  Surgeon: Mcarthur Rossetti, MD;  Location: [facility];  Service: Orthopedics;  Laterality: Right;  Repeat irrigation and debridement  . JOINT REPLACEMENT     bilateral knee, right hip   . TOTAL HIP REVISION  12/05/2011   Procedure: TOTAL HIP REVISION;  Surgeon: Mcarthur Rossetti, MD;  Location: [facility];  Service: Orthopedics;  Laterality: Right;  Right Hip Revision Arthroplasty to Total Hip, Excision of Old Implant     A IV Location/Drains/Wounds Patient Lines/Drains/Airways Status   Active Line/Drains/Airways    Name:   Placement date:   Placement time:   Site:   Days:   Peripheral IV 05/07/18 Right Hand   05/07/18    1129    Hand   less than 1   External Urinary Catheter   05/07/18    1337    -   less than 1          Intake/Output Last 24 hours No intake or output data in the 24 hours ending 05/07/18 1531  Labs/Imaging Results for orders placed or performed during  the hospital encounter of 05/07/18 (from the past 48 hour(s))  CBC with Differential     Status: None   Collection Time: 05/07/18 10:49 AM  Result Value Ref Range   WBC 6.4 4.0 - 10.5 K/uL   RBC 4.36 3.87 - 5.11 MIL/uL   Hemoglobin 12.1 12.0 - 15.0 g/dL   HCT 36.2 36.0 - 46.0 %   MCV 83.0 80.0 - 100.0 fL   MCH 27.8 26.0 - 34.0 pg   MCHC 33.4 30.0 - 36.0 g/dL   RDW 15.4 11.5 - 15.5 %   Platelets 195 150 - 400 K/uL   nRBC 0.0 0.0 - 0.2 %   Neutrophils Relative % 58 %   Neutro Abs 3.8 1.7 - 7.7 K/uL   Lymphocytes Relative 28 %   Lymphs Abs 1.8 0.7 - 4.0 K/uL   Monocytes Relative 10 %   Monocytes Absolute 0.6 0.1 - 1.0 K/uL   Eosinophils Relative 2 %   Eosinophils Absolute 0.1 0.0 - 0.5  K/uL   Basophils Relative 1 %   Basophils Absolute 0.0 0.0 - 0.1 K/uL   Immature Granulocytes 1 %   Abs Immature Granulocytes 0.03 0.00 - 0.07 K/uL    Comment: Performed at [facility], [address], [place], [place] [address]  Comprehensive metabolic panel     Status: Abnormal   Collection Time: 05/07/18 10:49 AM  Result Value Ref Range   Sodium 134 (L) 135 - 145 mmol/L   Potassium 4.5 3.5 - 5.1 mmol/L   Chloride 102 98 - 111 mmol/L   CO2 25 22 - 32 mmol/L   Glucose, Bld 187 (H) 70 - 99 mg/dL   BUN 16 8 - 23 mg/dL   Creatinine, Ser 1.00 0.44 - 1.00 mg/dL   Calcium 9.3 8.9 - 10.3 mg/dL   Total Protein 6.2 (L) 6.5 - 8.1 g/dL   Albumin 3.5 3.5 - 5.0 g/dL   AST 23 15 - 41 U/L   ALT 20 0 - 44 U/L   Alkaline Phosphatase 69 38 - 126 U/L   Total Bilirubin 0.5 0.3 - 1.2 mg/dL   GFR calc non Af Amer 53 (L) >60 mL/min   GFR calc Af Amer >60 >60 mL/min   Anion gap 7 5 - 15    Comment: Performed at [facility] [address] [place], [place] [address]  Lactic acid, plasma     Status: Abnormal   Collection Time: 05/07/18 10:49 AM  Result Value Ref Range   Lactic Acid, Venous 2.2 (HH) 0.5 - 1.9 mmol/L    Comment: CRITICAL RESULT CALLED TO, READ BACK BY AND VERIFIED WITH: [facility] 1151 05/07/2018 CLARK,S Performed at [facility], [place] [address] [place], [place] [address]   Brain natriuretic peptide     Status: None   Collection Time: 05/07/18 10:49 AM  Result Value Ref Range   B Natriuretic Peptide 50.1 0.0 - 100.0 pg/mL    Comment: Performed at [facility] [address] [place], [place] [address]  Urinalysis, Routine w reflex microscopic     Status: Abnormal   Collection Time: 05/07/18 11:40 AM  Result Value Ref Range   Color, Urine YELLOW YELLOW   APPearance HAZY (A) CLEAR   Specific Gravity, Urine 1.006 1.005 - 1.030   pH 6.0 5.0 - 8.0   Glucose, UA NEGATIVE NEGATIVE mg/dL   Hgb urine dipstick NEGATIVE NEGATIVE   Bilirubin Urine NEGATIVE  NEGATIVE   Ketones, ur  NEGATIVE NEGATIVE mg/dL   Protein, ur NEGATIVE NEGATIVE mg/dL   Nitrite POSITIVE (A) NEGATIVE   Leukocytes,Ua LARGE (A) NEGATIVE   RBC / HPF 11-20 0 - 5 RBC/hpf   WBC, UA >50 (H) 0 - 5 WBC/hpf   Bacteria, UA RARE (A) NONE SEEN   Squamous Epithelial / LPF 0-5 0 - 5   Mucus PRESENT     Comment: Performed at [facility], [place] [address] [place], [place] [address]  Lactic acid, plasma     Status: None   Collection Time: 05/07/18 12:57 PM  Result Value Ref Range   Lactic Acid, Venous 1.3 0.5 - 1.9 mmol/L    Comment: Performed at [facility] [address], [place], [place] [address]   Dg Chest 2 View  Result Date: 05/07/2018 CLINICAL DATA:  Wheezing. EXAM: CHEST - 2 VIEW COMPARISON:  03/06/2017 and prior radiographs FINDINGS: Cardiomegaly and mild pulmonary vascular congestion noted. This is a mildly low volume film with mild bibasilar atelectasis. Possible trace bilateral pleural effusions noted. There is no evidence of focal airspace disease, pulmonary edema, suspicious pulmonary nodule/mass or pneumothorax. No acute bony abnormalities are identified. IMPRESSION: Cardiomegaly with mild pulmonary vascular congestion, mild bibasilar atelectasis and possible trace bilateral pleural effusions. Electronically Signed   By: Margarette Canada M.D.   On: 05/07/2018 12:40    Pending Labs Unresulted Labs (From admission, onward)    Start     Ordered   05/07/18 1033  Urine culture  ONCE - STAT,   STAT     05/07/18 1037          Vitals/Pain Today's Vitals   05/07/18 1430 05/07/18 1445 05/07/18 1500 05/07/18 1515  BP: (!) 117/54 126/77 135/83 (!) 138/93  Pulse: 62 62 (!) 59 (!) 59  Resp: 17 20 18 18   Temp:      TempSrc:      SpO2: 98% 100% 100% 97%  Weight:      Height:      PainSc:        Isolation Precautions No active isolations  Medications Medications  ipratropium-albuterol (DUONEB) 0.5-2.5 (3) MG/3ML nebulizer solution 3 mL (3 mLs Nebulization  Given 05/07/18 1119)  furosemide (LASIX) injection 20 mg (20 mg Intravenous Given 05/07/18 1341)  fosfomycin (MONUROL) packet 3 g (3 g Oral Given 05/07/18 1447)    Mobility walks with device Low fall risk   Focused Assessments Cardiac Assessment Handoff:    Lab Results  Component Value Date   CKTOTAL 150 09/25/2014   TROPONINI <0.30 06/27/2013   Lab Results  Component Value Date   DDIMER 1.80 (H) 01/02/2012   Does the Patient currently have chest pain? No     R Recommendations: See Admitting Provider Note  Report given to:   Additional Notes:

## 2018-05-07 NOTE — ED Notes (Signed)
Pt ambulated and got SOB upon exertion. Pt's oxygen saturation levels dropped to 89% on room air.

## 2018-05-07 NOTE — ED Provider Notes (Signed)
[facility], [place] EMERGENCY DEPARTMENT Provider Note   CSN: [id number] Arrival date & time: 05/07/18  1009    History   Chief Complaint Chief Complaint  Patient presents with   Urinary Tract Infection    HPI Joyce Gilmore is a 84 y.o. female.     Pt presents to the ED after being called by PCP and told to come for UTI. Family states they were not told much more by PCP and cannot give too much history. They state she has seen her PCP multiple times recently for a UTI and that the antibiotics are not working for her. Per chart review; pt was recently treated in [place] for a UTI but could not recall the name of the medication. She was then see by PCP Delsa Grana, PA-C for same. At that point pt was wheezing diffusely, treated with azithromycin for both COPD exacerbation and UTI. Urine culture collected at that time; pt was called a couple of days later multiple times for resistance to antibiotic and need to change to levaquin; it is unclear if patient was reached for change in medication. She was seen again on 03/09 and had a repeat urine culture done and was called today regarding results and need to come to the ED for IV antibiotics. Pt currently only complaining of vaginal itching.   The history is provided by the patient and a relative. The history is limited by a language barrier. No language interpreter was used (Family used as Veterinary surgeon).    Past Medical History:  Diagnosis Date   Arthritis    CHF (congestive heart failure) (HCC)    diastolic   Chronic kidney disease    hx of kidney stones,    Depression    hx of    Diabetes mellitus without complication (HCC)    Ganglion, left ankle and foot    Hearing loss of both ears    Hypertension    MRSA infection    Oct 13 - Nov 13   Osteoporosis    Tinnitus of both ears     Patient Active Problem List   Diagnosis Date Noted   CHF (congestive heart failure) ([place]) 05/07/2018   GERD (gastroesophageal  reflux disease) 03/10/2017   Uses hearing aid 09/12/2016   Asthmatic bronchitis 03/11/2016   Chronic diastolic heart failure ([place]) 10/12/2015   Shortness of breath 10/12/2015   Seasonal allergies 11/21/2014   DDD (degenerative disc disease), lumbar 10/11/2014   H/O compression fracture of spine 10/11/2014   Pulmonary hypertension ([place])    Hepatic cirrhosis ([place]) 09/24/2014   SIADH (syndrome of inappropriate ADH production) ([place]) 06/13/2014   Insomnia 12/28/2013   Anemia 07/06/2013   HLD (hyperlipidemia) 06/27/2013   Osteoporosis 07/26/2012   Hyponatremia 05/16/2011   Type 2 diabetes mellitus with diabetic chronic kidney disease ([place]) 08/17/2006   Essential hypertension 08/17/2006   DIVERTICULOSIS, COLON 08/17/2006    Past Surgical History:  Procedure Laterality Date   BACK SURGERY     history restored after error with record merge       INCISION AND DRAINAGE HIP  12/22/2011   Procedure: IRRIGATION AND DEBRIDEMENT HIP;  Surgeon: Mcarthur Rossetti, MD;  Location: [place];  Service: Orthopedics;  Laterality: Right;  Irrigation and debridement right hip   INCISION AND DRAINAGE HIP  12/27/2011   Procedure: IRRIGATION AND DEBRIDEMENT HIP;  Surgeon: Mcarthur Rossetti, MD;  Location: [place];  Service: Orthopedics;  Laterality: Right;  Repeat irrigation and debridement   JOINT  REPLACEMENT     bilateral knee, right hip    TOTAL HIP REVISION  12/05/2011   Procedure: TOTAL HIP REVISION;  Surgeon: Mcarthur Rossetti, MD;  Location: [facility];  Service: Orthopedics;  Laterality: Right;  Right Hip Revision Arthroplasty to Total Hip, Excision of Old Implant     OB History   No obstetric history on file.      Home Medications    Prior to Admission medications   Medication Sig Start Date End Date Taking? Authorizing Provider  Acetaminophen (TYLENOL PO) Take 1,000 tablets by mouth every 6 (six) hours as needed (pain/headache).    Yes [provider]    albuterol (PROVENTIL HFA;VENTOLIN HFA) 108 (90 Base) MCG/ACT inhaler Inhale 1-2 puffs into the lungs every 4 (four) hours as needed for wheezing or shortness of breath. 01/27/18  Yes [place], Modena Nunnery, MD  albuterol (PROVENTIL) (2.5 MG/3ML) 0.083% nebulizer solution Take 3 mLs (2.5 mg total) by nebulization every 6 (six) hours as needed for wheezing or shortness of breath. 04/23/18  Yes [place], Modena Nunnery, MD  amLODipine (NORVASC) 5 MG tablet TAKE 1 TABLET BY MOUTH DAILY Patient taking differently: Take 5 mg by mouth daily.  04/19/18  Yes [place], Modena Nunnery, MD  aspirin EC 81 MG tablet Take 81 mg by mouth daily.   Yes [provider]  benzonatate (TESSALON) 100 MG capsule Take 1 capsule (100 mg total) by mouth 3 (three) times daily as needed for cough. [place]ish 04/23/18  Yes [place], Modena Nunnery, MD  calcium carbonate (OSCAL) 1500 (600 Ca) MG TABS tablet Take 600 mg of elemental calcium by mouth 2 (two) times daily with a meal.   Yes [provider]  fluticasone (FLONASE) 50 MCG/ACT nasal spray Place 2 sprays into both nostrils daily. 04/23/18  Yes [place], Modena Nunnery, MD  Fluticasone Furoate (ARNUITY ELLIPTA) 100 MCG/ACT AEPB Inhale 1 puff into the lungs daily. 01/29/18  Yes [place], Modena Nunnery, MD  furosemide (LASIX) 20 MG tablet Take 1 tablet (20 mg total) by mouth daily. For fluid - spanish speaking 06/11/17  Yes [place], Modena Nunnery, MD  insulin aspart (NOVOLOG FLEXPEN) 100 UNIT/ML FlexPen FOLLOW SLIDING SCALE AS DIRECTED~ inject 5-15 U as directed. Patient taking differently: Inject 5-15 Units into the skin 3 (three) times daily with meals. Per sliding scale 01/27/18  Yes [place], Modena Nunnery, MD  Insulin Glargine (LANTUS SOLOSTAR) 100 UNIT/ML Solostar Pen Inject 36 Units into the skin at bedtime. 01/27/18  Yes [employer], Modena Nunnery, MD  lansoprazole (PREVACID) 30 MG capsule TAKE 1 CAPSULE BY MOUTH TWICE DAILY BEFORE A MEAL Patient taking differently: Take 30 mg by mouth 2 (two) times daily before a  meal.  01/27/18  Yes [place], Modena Nunnery, MD  lisinopril (PRINIVIL,ZESTRIL) 10 MG tablet Take 1 tablet (10 mg total) by mouth daily. 06/11/17  Yes [place], Modena Nunnery, MD  metoprolol tartrate (LOPRESSOR) 25 MG tablet Take 0.5 tablets (12.5 mg total) by mouth 2 (two) times daily. 01/27/18  Yes [place], Modena Nunnery, MD  naproxen sodium (ALEVE) 220 MG tablet Take 220 mg by mouth 2 (two) times daily as needed (pain/headache).   Yes [provider]  polyvinyl alcohol (ARTIFICIAL TEARS) 1.4 % ophthalmic solution Place 1 drop into both eyes as needed for dry eyes. 10/12/17  Yes [place], Modena Nunnery, MD  pravastatin (PRAVACHOL) 20 MG tablet Take 1 tablet (20 mg total) by mouth daily. 01/27/18  Yes [place], Modena Nunnery, MD  sucralfate (CARAFATE) 1 g tablet Take 1 tablet (  1 g total) by mouth 4 (four) times daily. 06/11/17  Yes [place], Modena Nunnery, MD  traMADol (ULTRAM) 50 MG tablet Take 1 tablet (50 mg total) by mouth 2 (two) times daily as needed. Chronic pain Patient taking differently: Take 50 mg by mouth 2 (two) times daily as needed for severe pain.  01/27/18  Yes [place], Modena Nunnery, MD  ACCU-CHEK AVIVA PLUS test strip USE TO CHECK BLOOD SUGAR FOUR TIMES DAILY AS DIRECTED 12/21/17   Alycia Rossetti, MD  ACCU-CHEK [facility] LANCETS lancets USE AS DIRECTED 06/11/17   Alycia Rossetti, MD  blood glucose meter kit and supplies KIT Dispense based on patient and insurance preference. Use up to four times daily as directed. (FOR ICD-9 250.00, 250.01). 04/25/14   Dena Billet B, PA-C  glucose blood (ACCU-CHEK AVIVA PLUS) test strip USE TO CHECK BLOOD SUGAR FOUR TIMES DAILY AS DIRECTED 06/11/17   [place], Modena Nunnery, MD  Insulin Pen Needle (B-D ULTRAFINE III SHORT PEN) 31G X 8 MM MISC USE AS DIRECTED 06/11/17   [place], Modena Nunnery, MD  NOVOLOG FLEXPEN 100 UNIT/ML FlexPen FOLLOW SLIDING SCALE AS DIRECTED Patient taking differently: Inject into the skin 3 (three) times daily with meals. Per sliding scale 12/21/17   Alycia Rossetti, MD     Family History Family History  Problem Relation Age of Onset   Hypertension Mother    Colon cancer Neg Hx    Breast cancer Neg Hx     Social History Social History   Tobacco Use   Smoking status: Never Smoker   Smokeless tobacco: Never Used  Substance Use Topics   Alcohol use: No    Alcohol/week: 0.0 standard drinks   Drug use: No     Allergies   Bactrim [sulfamethoxazole-trimethoprim]; Rifampin; and Vancomycin   Review of Systems Review of Systems  Constitutional: Positive for fever (subjective). Negative for chills.  Respiratory: Positive for cough and wheezing. Negative for shortness of breath.   Cardiovascular: Negative for chest pain.  Gastrointestinal: Positive for abdominal pain (suprapubic pain). Negative for nausea and vomiting.  Genitourinary: Positive for frequency.     Physical Exam Updated Vital Signs BP (!) 138/93    Pulse (!) 59    Temp 99 F (37.2 C) (Rectal)    Resp 18    Ht '4\' 8"'$  (1.422 m)    Wt 73 kg    LMP  (Exact Date)    SpO2 97%    BMI 36.10 kg/m   Physical Exam Vitals signs and nursing note reviewed.  Constitutional:      Appearance: She is not ill-appearing, toxic-appearing or diaphoretic.  HENT:     Head: Normocephalic and atraumatic.  Eyes:     Conjunctiva/sclera: Conjunctivae normal.  Neck:     Musculoskeletal: Neck supple.  Cardiovascular:     Rate and Rhythm: Normal rate and regular rhythm.     Pulses: Normal pulses.  Pulmonary:     Effort: Pulmonary effort is normal. No tachypnea.     Breath sounds: Wheezing (Wheezing throughout all lung fields) present. No rhonchi or rales.  Abdominal:     General: Abdomen is flat.     Palpations: Abdomen is soft.     Tenderness: There is no abdominal tenderness. There is no guarding or rebound.  Musculoskeletal:        General: No swelling.  Skin:    General: Skin is warm and dry.  Neurological:     Mental Status: She is alert.  ED Treatments / Results   Labs (all labs ordered are listed, but only abnormal results are displayed) Labs Reviewed  COMPREHENSIVE METABOLIC PANEL - Abnormal; Notable for the following components:      Result Value   Sodium 134 (*)    Glucose, Bld 187 (*)    Total Protein 6.2 (*)    GFR calc non Af Amer 53 (*)    All other components within normal limits  URINALYSIS, ROUTINE W REFLEX MICROSCOPIC - Abnormal; Notable for the following components:   APPearance HAZY (*)    Nitrite POSITIVE (*)    Leukocytes,Ua LARGE (*)    WBC, UA >50 (*)    Bacteria, UA RARE (*)    All other components within normal limits  LACTIC ACID, PLASMA - Abnormal; Notable for the following components:   Lactic Acid, Venous 2.2 (*)    All other components within normal limits  URINE CULTURE  CBC WITH DIFFERENTIAL/PLATELET  LACTIC ACID, PLASMA  BRAIN NATRIURETIC PEPTIDE    EKG EKG Interpretation  Date/Time:  Friday May 07 2018 13:46:06 EDT Ventricular Rate:  59 PR Interval:    QRS Duration: 99 QT Interval:  380 QTC Calculation: 377 R Axis:   46 Text Interpretation:  Sinus rhythm Nonspecific ST abnormality Confirmed by Lajean Saver [phone]) on 05/07/2018 1:59:36 PM   Radiology Dg Chest 2 View  Result Date: 05/07/2018 CLINICAL DATA:  Wheezing. EXAM: CHEST - 2 VIEW COMPARISON:  03/06/2017 and prior radiographs FINDINGS: Cardiomegaly and mild pulmonary vascular congestion noted. This is a mildly low volume film with mild bibasilar atelectasis. Possible trace bilateral pleural effusions noted. There is no evidence of focal airspace disease, pulmonary edema, suspicious pulmonary nodule/mass or pneumothorax. No acute bony abnormalities are identified. IMPRESSION: Cardiomegaly with mild pulmonary vascular congestion, mild bibasilar atelectasis and possible trace bilateral pleural effusions. Electronically Signed   By: Margarette Canada M.D.   On: 05/07/2018 12:40    Procedures Procedures (including critical care time)  Medications  Ordered in ED Medications  ipratropium-albuterol (DUONEB) 0.5-2.5 (3) MG/3ML nebulizer solution 3 mL (3 mLs Nebulization Given 05/07/18 1119)  furosemide (LASIX) injection 20 mg (20 mg Intravenous Given 05/07/18 1341)  fosfomycin (MONUROL) packet 3 g (3 g Oral Given 05/07/18 1447)     Initial Impression / Assessment and Plan / ED Course  I have reviewed the triage vital signs and the nursing notes.  Pertinent labs & imaging results that were available during my care of the patient were reviewed by me and considered in my medical decision making (see chart for details).  Clinical Course as of May 07 1554  Fri May 07, 2018  1351 Patient seen by myself with orienting PA Eustaquio Maize.  Patient sent from PCP after she was found to have ESBL E. coli urine culture, has been treated for the past week and a half for persistent UTI, patient has also been on treatment for COPD exacerbation, continues to experience some shortness of breath, currently on room air.  Chest x-ray also shows some bibasilar pulmonary vascular congestion and edema and patient has known history of CHF.  On exam after duo nebs some crackles auscultated in bilateral bases.  Suspect shortness of breath is due to combination of CHF and COPD exacerbations.  Urinalysis shows worsening urinary tract infection.  Discussed with pharmacy who recommends fosfomycin for ESBL infection.  Will consult for admission.   [KF]  4270 Discussed with Dr. Verlon Au who agrees with plan for diuresis and fosfomycin antibiotics but feels  that if patient is not de-satting with ambulation here in the ED then she can likely be treated at home as an outpatient.  He requested patient be ambulated, if patient has desaturations he is happy to admit but otherwise he recommends discharge.   [KF]  1502 HCT: 36.2 [MV]    Clinical Course User Index [KF] Jacqlyn Larsen, PA-C [MV] Eustaquio Maize, PA-C   Presents due to being told by PCP to come for UTI. Per chart  review pt has been seen multiple times by PCP for UTI; treated originally with azithromycin for COPD exacerbation and UTI; changed to [place] although unsure if patient has been taking this due to language barrier. Grandson reports they were called today to come to the ED. Urine culture positive for E coli with EBSL; sensitive to ertapenem, imipenem, gentamicin, macrobid, and pip-tazo.  Family endorses subjective fevers at home and complaints of vaginal itching. Pt was treated with diflucan by PCP on 04/20/18. Pt also wheezing diffusely throughout all lung fields; was told to increase nebulizer treatment to q 4 hrs which she has been doing at home without improvement. Pt denies SOB at the moment and states she feels good. Will get duoneb treated started in the ED as well as CXR given hx of COPD and CHF. Pt afebrile in the ED currently. Baseline labs initiated as well as U/A, urine culture, and lactic acid to assess for sepsis. Will discuss case with Dr. Ashok Cordia attending physician as well as pharmacy to determine best antibiotics.   11:14 AM Discussed case with pharmacist who had a look at pt's urine culture; suggested 1 time dose of 3 g Fosfomycin and discharge home. Suggested to avoid macrobid given pt's COPD and likely exacerbation to prevent pulmonary fibrosis. Will await for bloodwork to come back prior to deciding what antibiotic to use considering patient may need to be admitted for COPD exacerbation. Duoneb ordered; will reevaluate pt afterwards.   12:14 PM Pt still wheezing after 1 duoneb treatment; will order another round. 125 mg solumedrol ordered as well. Still awaiting U/A and CXR.    1:08 PM U/A with LE and Nitrites, 50 WBCs; compared to UA 2 weeks ago pt's UTI is getting worse despite antibiotic use. CXR also with pulmonary congestion, bibasilar atelectasis, and possible pleural effusions. After multiple duoneb treatments wheezing has mildly improved although able to auscultate mild crackles  at the bases now. Benedetto Goad, PA-C discussed case with pharmacist who suggests 3 g Fosfomycin for UTI. Will get EKG prior to discussing case with hospitalist for admission. 20 mg Lasix given as well.   1:50 PM Benedetto Goad, PA-C discussed case with Dr. Verlon Au who suggested ambulating patient and checking pulse ox; will admit if she desaturates but otherwise he believes she is stable enough to be discharged home. Orders placed.   3:11 PM Per nursing staff pt desatted to 89% with ambulation with increased work of breathing and tachypnea. Will re consult Dr. Verlon Au. Benedetto Goad, PA-C discussed case with him; will evaluate pt himself for admission.         Final Clinical Impressions(s) / ED Diagnoses   Final diagnoses:  Acute respiratory failure with hypoxia (HCC)  COPD exacerbation (HCC)  Acute on chronic congestive heart failure, unspecified heart failure type ([place])  UTI due to extended-spectrum beta lactamase (ESBL) producing Escherichia coli    ED Discharge Orders    None       Eustaquio Maize, PA-C 05/07/18 1557    Lajean Saver, MD  05/07/18 1629 ° °

## 2018-05-07 NOTE — ED Notes (Signed)
1 set of blood cultures at bedside in case same are ordered.

## 2018-05-07 NOTE — H&P (Signed)
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 now off of suppressive therapy  She comes to Ed at request of her PCP because of recently Rx for UTI--she was seen on 2/25 and 2/28 respectively and found to have at both occasions ESBL Ecoli--she qwas also Rx for asthmatic bronchitis on one of those occasions She had the specimens collected with I/o cahts While in ED-she was noted to be more hypoxic and dropped here oxygen to 89% on ambulation She has not had any fever nor chills, no dysuria, no cp no diarr, no vaginal d/c--but when she coughs she has some leakage of urine and goes frequently at night   ED Course: given a dose of lasix, Fosphomycin and a neb   Review of Systems:  Negative for fever, visual changes, sore throat, rash, new muscle aches, chest pain, SOB, dysuria, bleeding, n/v/abdominal pain.  Past Medical History:  Diagnosis Date  . Arthritis   . CHF (congestive heart failure) (HCC)    diastolic  . Chronic kidney disease    hx of kidney stones,   . Depression    hx of   . Diabetes mellitus without complication ([place])   . Ganglion, left ankle and foot   . Hearing loss of both ears   . Hypertension   . MRSA infection    Oct 13 - Nov 13  . Osteoporosis   . Tinnitus of both ears     Past Surgical History:  Procedure Laterality Date  . BACK SURGERY     history restored after error with record merge      . INCISION AND DRAINAGE HIP  12/22/2011   Procedure: IRRIGATION AND DEBRIDEMENT HIP;  Surgeon: Mcarthur Rossetti, MD;  Location: [facility];  Service: Orthopedics;  Laterality: Right;  Irrigation and debridement right hip  . INCISION AND DRAINAGE  HIP  12/27/2011   Procedure: IRRIGATION AND DEBRIDEMENT HIP;  Surgeon: Mcarthur Rossetti, MD;  Location: [facility];  Service: Orthopedics;  Laterality: Right;  Repeat irrigation and debridement  . JOINT REPLACEMENT     bilateral knee, right hip   . TOTAL HIP REVISION  12/05/2011   Procedure: TOTAL HIP REVISION;  Surgeon: Mcarthur Rossetti, MD;  Location: [facility];  Service: Orthopedics;  Laterality: Right;  Right Hip Revision Arthroplasty to Total Hip, Excision of Old Implant     reports that she has never smoked. She has never used smokeless tobacco. She reports that she does not drink alcohol or use drugs. Mobility: independant  Allergies  Allergen Reactions  . Bactrim [Sulfamethoxazole-Trimethoprim] Other (See Comments)    Hyperkalemia (elevated potassium)  . Rifampin Other (See Comments)    Severe thrombocytopenia (low blood platelet count)  . Vancomycin Other (See Comments)    Severe thrombocytopenia (low blood platelet count)    Family History  Problem Relation Age of Onset  . Hypertension Mother   . Colon cancer Neg Hx   . Breast cancer Neg Hx      Prior to Admission medications   Medication Sig Start Date End Date Taking? Authorizing Provider  Acetaminophen (TYLENOL PO) Take 1,000 tablets by mouth every 6 (six) hours as needed (pain/headache).    Yes [provider]  albuterol (  PROVENTIL HFA;VENTOLIN HFA) 108 (90 Base) MCG/ACT inhaler Inhale 1-2 puffs into the lungs every 4 (four) hours as needed for wheezing or shortness of breath. 01/27/18  Yes [place], Modena Nunnery, MD  albuterol (PROVENTIL) (2.5 MG/3ML) 0.083% nebulizer solution Take 3 mLs (2.5 mg total) by nebulization every 6 (six) hours as needed for wheezing or shortness of breath. 04/23/18  Yes [place], Modena Nunnery, MD  amLODipine (NORVASC) 5 MG tablet TAKE 1 TABLET BY MOUTH DAILY Patient taking differently: Take 5 mg by mouth daily.  04/19/18  Yes [place], Modena Nunnery, MD  aspirin EC 81 MG tablet Take 81 mg by  mouth daily.   Yes [provider]  benzonatate (TESSALON) 100 MG capsule Take 1 capsule (100 mg total) by mouth 3 (three) times daily as needed for cough. [place] 04/23/18  Yes [place], Modena Nunnery, MD  calcium carbonate (OSCAL) 1500 (600 Ca) MG TABS tablet Take 600 mg of elemental calcium by mouth 2 (two) times daily with a meal.   Yes [provider]  fluticasone (FLONASE) 50 MCG/ACT nasal spray Place 2 sprays into both nostrils daily. 04/23/18  Yes [place], Modena Nunnery, MD  Fluticasone Furoate (ARNUITY ELLIPTA) 100 MCG/ACT AEPB Inhale 1 puff into the lungs daily. 01/29/18  Yes [place], Modena Nunnery, MD  furosemide (LASIX) 20 MG tablet Take 1 tablet (20 mg total) by mouth daily. For fluid - spanish speaking 06/11/17  Yes [place], Modena Nunnery, MD  insulin aspart (NOVOLOG FLEXPEN) 100 UNIT/ML FlexPen FOLLOW SLIDING SCALE AS DIRECTED~ inject 5-15 U as directed. Patient taking differently: Inject 5-15 Units into the skin 3 (three) times daily with meals. Per sliding scale 01/27/18  Yes [place], Modena Nunnery, MD  Insulin Glargine (LANTUS SOLOSTAR) 100 UNIT/ML Solostar Pen Inject 36 Units into the skin at bedtime. 01/27/18  Yes [place], Modena Nunnery, MD  lansoprazole (PREVACID) 30 MG capsule TAKE 1 CAPSULE BY MOUTH TWICE DAILY BEFORE A MEAL Patient taking differently: Take 30 mg by mouth 2 (two) times daily before a meal.  01/27/18  Yes [place], Modena Nunnery, MD  lisinopril (PRINIVIL,ZESTRIL) 10 MG tablet Take 1 tablet (10 mg total) by mouth daily. 06/11/17  Yes [place], Modena Nunnery, MD  metoprolol tartrate (LOPRESSOR) 25 MG tablet Take 0.5 tablets (12.5 mg total) by mouth 2 (two) times daily. 01/27/18  Yes [place], Modena Nunnery, MD  naproxen sodium (ALEVE) 220 MG tablet Take 220 mg by mouth 2 (two) times daily as needed (pain/headache).   Yes [provider]  polyvinyl alcohol (ARTIFICIAL TEARS) 1.4 % ophthalmic solution Place 1 drop into both eyes as needed for dry eyes. 10/12/17  Yes [place], Modena Nunnery, MD   pravastatin (PRAVACHOL) 20 MG tablet Take 1 tablet (20 mg total) by mouth daily. 01/27/18  Yes [place], Modena Nunnery, MD  sucralfate (CARAFATE) 1 g tablet Take 1 tablet (1 g total) by mouth 4 (four) times daily. 06/11/17  Yes [place], Modena Nunnery, MD  traMADol (ULTRAM) 50 MG tablet Take 1 tablet (50 mg total) by mouth 2 (two) times daily as needed. Chronic pain Patient taking differently: Take 50 mg by mouth 2 (two) times daily as needed for severe pain.  01/27/18  Yes [place], Modena Nunnery, MD  ACCU-CHEK AVIVA PLUS test strip USE TO CHECK BLOOD SUGAR FOUR TIMES DAILY AS DIRECTED 12/21/17   Alycia Rossetti, MD  ACCU-CHEK [facility] LANCETS lancets USE AS DIRECTED 06/11/17   Alycia Rossetti, MD  blood glucose meter kit and supplies KIT Dispense based on patient and insurance  preference. Use up to four times daily as directed. (FOR ICD-9 250.00, 250.01). 04/25/14   Dena Billet B, PA-C  glucose blood (ACCU-CHEK AVIVA PLUS) test strip USE TO CHECK BLOOD SUGAR FOUR TIMES DAILY AS DIRECTED 06/11/17   [place], Modena Nunnery, MD  Insulin Pen Needle (B-D ULTRAFINE III SHORT PEN) 31G X 8 MM MISC USE AS DIRECTED 06/11/17   [place], Modena Nunnery, MD  NOVOLOG FLEXPEN 100 UNIT/ML FlexPen FOLLOW SLIDING SCALE AS DIRECTED Patient taking differently: Inject into the skin 3 (three) times daily with meals. Per sliding scale 12/21/17   Alycia Rossetti, MD    Physical Exam:  Vitals:   05/07/18 1500 05/07/18 1515  BP: 135/83 (!) 138/93  Pulse: (!) 59 (!) 59  Resp: 18 18  Temp:    SpO2: 100% 97%     eomi no ict no pallor no lan fundoscopy deferred  Ct ab but bilateral rales in lower lung fields no wheeze no retractions  s1 s 2no m/r/g  abd sfot nt nd no rebound no guard  Power 5/5 bilaterally, sensory grossly intact, no defciti  Very HOH  I have personally reviewed following labs and imaging studies  Labs:   bmet stable, bnp 50, lactic acid 2.2    Wbc 6.4  Hemoglobin 12.1  Imaging studies:  IMPRESSION:  Cardiomegaly with mild pulmonary vascular congestion, mild bibasilar  atelectasis and possible trace bilateral pleural effusions.   Medical tests:   EKG independently reviewed: nsr, no st-t changes    Test discussed with performing physician:  Yes-PA in ed   Decision to obtain old records:   y   Review and summation of old records:   y   Principal Problem:   SOB (shortness of breath)  Unclear etiology Might have some mild decomp HF-but given her BNP is low and no overt vol overload would hold further lasix at this time and repeat a cxr in the am Will give a short burst of prednisone 40 x 4 days and see how she does--start Anora ellipta and Albuterol nebs--desatted to 89% so will need desat screen before d/c Active Problems: ?ESBL UTI Further review of chart shows 2 episodes of ESBL isolate--would treat with another dose Fosphomycin in 2 days and hold other therapies--she describes some retention of urine and has leakage--will need education, may benefit from pessary? Would hold IV Carbapenem for now unless becomes more symtpomatic   Type 2 diabetes mellitus with diabetic chronic kidney disease ([place]) continue home meds of lantus, SSI and adjust as prn on steroids   Asthmatic bronchitis  See above   CHF (congestive heart failure) (HCC) As above--place on lasix 20 daily as per home regimen in am cxr as above   Chronic kidney disease Stable at this time--creat at usual levels   Lactic acidosis unliekyl infectiosu related--watch for fever curve etc    Severity of Illness: The appropriate patient status for this patient is INPATIENT. Inpatient status is judged to be reasonable and necessary in order to provide the required intensity of service to ensure the patient's safety. The patient's presenting symptoms, physical exam findings, and initial radiographic and laboratory data in the context of their chronic comorbidities is felt to place them at high risk for further clinical  deterioration. Furthermore, it is not anticipated that the patient will be medically stable for discharge from the hospital within 2 midnights of admission. The following factors support the patient status of inpatient.   " The patient's presenting symptoms include sob. "  The worrisome physical exam findings include cardiomegally on cxr. " The initial radiographic and laboratory data are worrisome because of cocenr for infection. " The chronic co-morbidities include  debilitty.   * I certify that at the point of admission it is my clinical judgment that the patient will require inpatient hospital care spanning beyond 2 midnights from the point of admission due to high intensity of service, high risk for further deterioration and high frequency of surveillance required.*     DVT prophylaxis:loveneox Code Status: full Family Communication:  amily--interviewed with interpreter Consults called: none a tthis time    Time spent: 28 minutes  Veera Stapleton, MD  [facility] Direct contact: [phone] --[place] [place]  --[email or web address]; password [email or web address]  7PM-7AM contact night coverage as above  05/07/2018, 3:24 PM

## 2018-05-08 DIAGNOSIS — J4540 Moderate persistent asthma, uncomplicated: Secondary | ICD-10-CM

## 2018-05-08 DIAGNOSIS — N10 Acute tubulo-interstitial nephritis: Secondary | ICD-10-CM

## 2018-05-08 DIAGNOSIS — N183 Chronic kidney disease, stage 3 (moderate): Secondary | ICD-10-CM

## 2018-05-08 DIAGNOSIS — E1122 Type 2 diabetes mellitus with diabetic chronic kidney disease: Secondary | ICD-10-CM

## 2018-05-08 DIAGNOSIS — I5033 Acute on chronic diastolic (congestive) heart failure: Secondary | ICD-10-CM

## 2018-05-08 DIAGNOSIS — I10 Essential (primary) hypertension: Secondary | ICD-10-CM

## 2018-05-08 DIAGNOSIS — N182 Chronic kidney disease, stage 2 (mild): Secondary | ICD-10-CM

## 2018-05-08 DIAGNOSIS — Z794 Long term (current) use of insulin: Secondary | ICD-10-CM

## 2018-05-08 LAB — GLUCOSE, CAPILLARY
Glucose-Capillary: 136 mg/dL — ABNORMAL HIGH (ref 70–99)
Glucose-Capillary: 264 mg/dL — ABNORMAL HIGH (ref 70–99)
Glucose-Capillary: 340 mg/dL — ABNORMAL HIGH (ref 70–99)
Glucose-Capillary: 216 mg/dL — ABNORMAL HIGH (ref 70–99)

## 2018-05-08 LAB — COMPREHENSIVE METABOLIC PANEL
ALT: 19 U/L (ref 0–44)
AST: 23 U/L (ref 15–41)
Albumin: 3.3 g/dL — ABNORMAL LOW (ref 3.5–5.0)
Alkaline Phosphatase: 56 U/L (ref 38–126)
Anion gap: 11 (ref 5–15)
BUN: 24 mg/dL — ABNORMAL HIGH (ref 8–23)
CO2: 23 mmol/L (ref 22–32)
Calcium: 9.5 mg/dL (ref 8.9–10.3)
Chloride: 101 mmol/L (ref 98–111)
Creatinine, Ser: 1.08 mg/dL — ABNORMAL HIGH (ref 0.44–1.00)
GFR calc Af Amer: 56 mL/min — ABNORMAL LOW (ref >60)
GFR calc non Af Amer: 48 mL/min — ABNORMAL LOW (ref >60)
Glucose, Bld: 152 mg/dL — ABNORMAL HIGH (ref 70–99)
Potassium: 4.3 mmol/L (ref 3.5–5.1)
Sodium: 135 mmol/L (ref 135–145)
Total Bilirubin: 0.5 mg/dL (ref 0.3–1.2)
Total Protein: 6.2 g/dL — ABNORMAL LOW (ref 6.5–8.1)

## 2018-05-08 LAB — CBC
HCT: 37.6 % (ref 36.0–46.0)
Hemoglobin: 12.2 g/dL (ref 12.0–15.0)
MCH: 26.6 pg (ref 26.0–34.0)
MCHC: 32.4 g/dL (ref 30.0–36.0)
MCV: 82.1 fL (ref 80.0–100.0)
Platelets: 166 10*3/uL (ref 150–400)
RBC: 4.58 MIL/uL (ref 3.87–5.11)
RDW: 15.3 % (ref 11.5–15.5)
WBC: 6.7 10*3/uL (ref 4.0–10.5)
nRBC: 0 % (ref 0.0–0.2)

## 2018-05-08 MED ORDER — SODIUM CHLORIDE 0.9 % IV SOLN
1.0000 g | INTRAVENOUS | Status: DC
  Administered 2018-05-08 – 2018-05-11 (×4): 1000 mg via INTRAVENOUS
  Filled 2018-05-08 (×4): qty 1

## 2018-05-08 NOTE — Progress Notes (Signed)
PROGRESS NOTE    Joyce Gilmore  GEZ:[id number] DOB: 11/18/1934 DOA: 05/07/2018 PCP: Alycia Rossetti, MD    Brief Narrative:  84 year old female who presented with dyspnea.  She does have significant past medical history for asthma, type 2 diabetes mellitus, chronic diastolic heart failure, hypertension, chronic kidney disease stage II/III, pulmonary hypertension, dyslipidemia and a prior right prosthetic hip MRSA infection.  He had a recent urinary tract infection and an asthmatic bronchitis exacerbation.  She presented due to worsening dyspnea.  On her initial physical examination she was hypoxic on ambulation 89%, her blood pressure 135/83, heart rate 59, respiratory rate 18, oximetry with submental oxygen per nasal cannula 97%.  Heart S1-S2 present, rhythmic, lungs with bilateral rails but no wheezing, abdomen soft non-distended, no lower extremity edema.  Sodium 134, potassium 4.5, chloride 102, bicarb 25, glucose 187, BUN 16, creatinine 1.0, BNP 50, white count 6.4, hemoglobin 12.1, hematocrit 36.2, platelets 195, urinalysis with greater than 50 white cells.  Her chest x-ray had increased lung markings bilaterally, chronic, small bibasilar atelectasis.  Her EKG had 70 bpm, sinus rhythm, normal conduction, normal axis, normal intervals, no significant ST segment or T wave abnormalities.  Patient was admitted to the hospital with a working diagnosis of acute hypoxic respiratory failure due to possible pulmonary edema/decompensated diastolic heart failure.   Assessment & Plan:   Principal Problem:   SOB (shortness of breath) Active Problems:   Type 2 diabetes mellitus with diabetic chronic kidney disease (HCC)   Asthmatic bronchitis   CHF (congestive heart failure) (HCC)   Chronic kidney disease   Lactic acidosis   1. Recurrent urinary tract infection, with multi drug resistant E. Coli, failed outpatient therapy, present on admission. Urine culture positive for >100,000 cfu E. Coli, last  culture from 3.9, the E coli was ESBL. Patient has been presenting persistent symptoms, lower abdominal pain. Will change antibiotic therapy with ertapenem. Will continue to follow cell count, temperature curve and culture sensitivities.   2. Acute on chronic diastolic heart failure decompensation, complicated with acute hypoxic respiratory failure. Patient tolerating well diuresis with furosemide, improved oxygenation, today up to 95% on room air. Last echocardiogram from 2017 had preserved LV systolic function, personally reviewed old records.   3. T2DM. Continue glucose cover and monitoring with insulin sliding scale, patient is tolerating po well. Basal insulin with 36 units, fasting glucose 152 mg/dl. Will dc steroids.   4. CKD stage 3. Stable renal function with serum cr at 1,0, with K at 4,3 and serum bicarbonate at 23.   5. Obesity. Calculated bmi is 34,7, will need outpatient follow up.   6. HTN. Continue blood pressure control with amlodipine, lisinopril, metoprolol  7. COPD. Stable with no signs of exacerbation, will continue budesonide/ anoro. Will dc steroids.   DVT prophylaxis: enoxaparin   Code Status: full Family Communication: I spoke with patient's daughter at the bedside and all questions were addressed.  Disposition Plan/ discharge barriers: pending clinical improvement   Body mass index is 34.57 kg/m. Malnutrition Type:      Malnutrition Characteristics:      Nutrition Interventions:     RN Pressure Injury Documentation:     Consultants:     Procedures:     Antimicrobials:   Ertapenem.     Subjective: Patient feeling better, patient has been treated for urinary tract infection x2 since December. Recurrent symptoms of generalized weakness and lower abdominal pain. No nausea or vomiting.   Objective: Vitals:   05/08/18 0555  05/08/18 0914 05/08/18 0924 05/08/18 1141  BP: (!) 123/59  (!) 126/58 136/74  Pulse: 65  65 65  Resp: 18   (!) 22   Temp: 98.3 F (36.8 C)   98.6 F (37 C)  TempSrc: Oral   Oral  SpO2: 100% 99%  95%  Weight:      Height:        Intake/Output Summary (Last 24 hours) at 05/08/2018 1240 Last data filed at 05/08/2018 1143 Gross per 24 hour  Intake 960 ml  Output 550 ml  Net 410 ml   Filed Weights   05/07/18 1111 05/07/18 1545 05/08/18 0020  Weight: 73 kg 70.4 kg 69.9 kg    Examination:   General: Not in pain or dyspnea, deconditioned  Neurology: Awake and alert, non focal  E ENT:  Mild pallor, no icterus, oral mucosa moist Cardiovascular: No JVD. S1-S2 present, rhythmic, no gallops, rubs, or murmurs. No lower extremity edema. Pulmonary: positive breath sounds bilaterally, no wheezing, or rhonchi, no rales. Gastrointestinal. Abdomen protuberant with no organomegaly, non tender, no rebound or guarding Skin. No rashes Musculoskeletal: no joint deformities     Data Reviewed: I have personally reviewed following labs and imaging studies  CBC: Recent Labs  Lab 05/03/18 1203 05/07/18 1049 05/08/18 0428  WBC 8.1 6.4 6.7  NEUTROABS 4,803 3.8  --   HGB 13.0 12.1 12.2  HCT 40.2 36.2 37.6  MCV 83.1 83.0 82.1  PLT 242 195 811   Basic Metabolic Panel: Recent Labs  Lab 05/03/18 1203 05/07/18 1049 05/08/18 0428  NA 139 134* 135  K 5.2 4.5 4.3  CL 102 102 101  CO2 27 25 23   GLUCOSE 82 187* 152*  BUN 12 16 24*  CREATININE 0.98* 1.00 1.08*  CALCIUM 9.8 9.3 9.5   GFR: Estimated Creatinine Clearance: 32.1 mL/min (A) (by C-G formula based on SCr of 1.08 mg/dL (H)). Liver Function Tests: Recent Labs  Lab 05/03/18 1203 05/07/18 1049 05/08/18 0428  AST 27 23 23   ALT 20 20 19   ALKPHOS  --  69 56  BILITOT 0.3 0.5 0.5  PROT 6.6 6.2* 6.2*  ALBUMIN  --  3.5 3.3*   No results for input(s): LIPASE, AMYLASE in the last 168 hours. No results for input(s): AMMONIA in the last 168 hours. Coagulation Profile: No results for input(s): INR, PROTIME in the last 168 hours. Cardiac Enzymes:  No results for input(s): CKTOTAL, CKMB, CKMBINDEX, TROPONINI in the last 168 hours. BNP (last 3 results) No results for input(s): PROBNP in the last 8760 hours. HbA1C: No results for input(s): HGBA1C in the last 72 hours. CBG: Recent Labs  Lab 05/07/18 1653 05/07/18 2042 05/08/18 0644 05/08/18 1134  GLUCAP 141* 283* 136* 264*   Lipid Profile: No results for input(s): CHOL, HDL, LDLCALC, TRIG, CHOLHDL, LDLDIRECT in the last 72 hours. Thyroid Function Tests: No results for input(s): TSH, T4TOTAL, FREET4, T3FREE, THYROIDAB in the last 72 hours. Anemia Panel: No results for input(s): VITAMINB12, FOLATE, FERRITIN, TIBC, IRON, RETICCTPCT in the last 72 hours.    Radiology Studies: I have reviewed all of the imaging during this hospital visit personally     Scheduled Meds: . amLODipine  5 mg Oral Daily  . aspirin EC  81 mg Oral Daily  . budesonide  0.25 mg Nebulization BID  . enoxaparin (LOVENOX) injection  40 mg Subcutaneous Q24H  . fluticasone  2 spray Each Nare Daily  . furosemide  20 mg Oral Daily  . insulin aspart  0-9 Units Subcutaneous TID WC  . insulin aspart  6 Units Subcutaneous TID WC  . insulin glargine  36 Units Subcutaneous QHS  . lisinopril  10 mg Oral Daily  . metoprolol tartrate  12.5 mg Oral BID  . pantoprazole  40 mg Oral Daily  . pravastatin  20 mg Oral Daily  . predniSONE  40 mg Oral QAC breakfast  . sucralfate  1 g Oral QID  . umeclidinium-vilanterol  1 puff Inhalation Daily   Continuous Infusions:   LOS: 1 day        Brandace Cargle Gerome Apley, MD

## 2018-05-08 NOTE — Progress Notes (Deleted)
RN received ED report that pt was only having chest pain when coughing. Pt endorsed chest pain only when coughing upon admission to this unit. Dr. Hal Hope came to unit shortly after admission to assess pt. Pt reported to MD that he has constant 4/10 chest pain at rest and 7/10 chest pain when coughing. EKG negative and Troponin negative. MD ordered Morphine IV x1. Upon reassessment, pt reports "chest pain is good" but rates it 4/10 when RN asked pt for numerical pain rating. Pt asleep upon later reassessment.

## 2018-05-09 DIAGNOSIS — N390 Urinary tract infection, site not specified: Secondary | ICD-10-CM

## 2018-05-09 LAB — URINE CULTURE: Culture: >=100000 — AB

## 2018-05-09 LAB — BASIC METABOLIC PANEL
Anion gap: 8 (ref 5–15)
BUN: 24 mg/dL — ABNORMAL HIGH (ref 8–23)
CO2: 26 mmol/L (ref 22–32)
CREATININE: 1.13 mg/dL — AB (ref 0.44–1.00)
Calcium: 9 mg/dL (ref 8.9–10.3)
Chloride: 102 mmol/L (ref 98–111)
GFR calc Af Amer: 53 mL/min — ABNORMAL LOW (ref >60)
GFR calc non Af Amer: 46 mL/min — ABNORMAL LOW (ref >60)
Glucose, Bld: 65 mg/dL — ABNORMAL LOW (ref 70–99)
Potassium: 3.7 mmol/L (ref 3.5–5.1)
Sodium: 136 mmol/L (ref 135–145)

## 2018-05-09 LAB — GLUCOSE, CAPILLARY
GLUCOSE-CAPILLARY: 202 mg/dL — AB (ref 70–99)
Glucose-Capillary: 71 mg/dL (ref 70–99)
Glucose-Capillary: 71 mg/dL (ref 70–99)
Glucose-Capillary: 156 mg/dL — ABNORMAL HIGH (ref 70–99)
Glucose-Capillary: 137 mg/dL — ABNORMAL HIGH (ref 70–99)

## 2018-05-09 LAB — CBC WITH DIFFERENTIAL/PLATELET
Abs Immature Granulocytes: 0.02 10*3/uL (ref 0.00–0.07)
Basophils Absolute: 0 10*3/uL (ref 0.0–0.1)
Basophils Relative: 1 %
Eosinophils Absolute: 0.1 10*3/uL (ref 0.0–0.5)
Eosinophils Relative: 1 %
HCT: 37.1 % (ref 36.0–46.0)
Hemoglobin: 12.1 g/dL (ref 12.0–15.0)
Immature Granulocytes: 0 %
LYMPHS PCT: 29 %
Lymphs Abs: 2.5 10*3/uL (ref 0.7–4.0)
MCH: 26.7 pg (ref 26.0–34.0)
MCHC: 32.6 g/dL (ref 30.0–36.0)
MCV: 81.7 fL (ref 80.0–100.0)
Monocytes Absolute: 0.8 10*3/uL (ref 0.1–1.0)
Monocytes Relative: 9 %
Neutro Abs: 5.1 10*3/uL (ref 1.7–7.7)
Neutrophils Relative %: 60 %
Platelets: 175 10*3/uL (ref 150–400)
RBC: 4.54 MIL/uL (ref 3.87–5.11)
RDW: 15 % (ref 11.5–15.5)
WBC: 8.5 10*3/uL (ref 4.0–10.5)
nRBC: 0 % (ref 0.0–0.2)

## 2018-05-09 MED ORDER — INSULIN GLARGINE 100 UNIT/ML ~~LOC~~ SOLN
15.0000 [IU] | Freq: Every day | SUBCUTANEOUS | Status: DC
  Administered 2018-05-09 – 2018-05-10 (×2): 15 [IU] via SUBCUTANEOUS
  Filled 2018-05-09 (×2): qty 0.15

## 2018-05-09 MED ORDER — INSULIN ASPART 100 UNIT/ML ~~LOC~~ SOLN
3.0000 [IU] | Freq: Three times a day (TID) | SUBCUTANEOUS | Status: DC
  Administered 2018-05-09 – 2018-05-11 (×5): 3 [IU] via SUBCUTANEOUS

## 2018-05-09 NOTE — Progress Notes (Signed)
PROGRESS NOTE    Joyce Gilmore  QBH:[id number] DOB: 1934/10/24 DOA: 05/07/2018 PCP: Alycia Rossetti, MD    Brief Narrative:  84 year old female who presented with dyspnea.  She does have significant past medical history for asthma, type 2 diabetes mellitus, chronic diastolic heart failure, hypertension, chronic kidney disease stage II/III, pulmonary hypertension, dyslipidemia and a prior right prosthetic hip MRSA infection.  He had a recent urinary tract infection and an asthmatic bronchitis exacerbation.  She presented due to worsening dyspnea.  On her initial physical examination she was hypoxic on ambulation 89%, her blood pressure 135/83, heart rate 59, respiratory rate 18, oximetry with submental oxygen per nasal cannula 97%.  Heart S1-S2 present, rhythmic, lungs with bilateral rails but no wheezing, abdomen soft non-distended, no lower extremity edema.  Sodium 134, potassium 4.5, chloride 102, bicarb 25, glucose 187, BUN 16, creatinine 1.0, BNP 50, white count 6.4, hemoglobin 12.1, hematocrit 36.2, platelets 195, urinalysis with greater than 50 white cells.  Her chest x-ray had increased lung markings bilaterally, chronic, small bibasilar atelectasis.  Her EKG had 70 bpm, sinus rhythm, normal conduction, normal axis, normal intervals, no significant ST segment or T wave abnormalities.  Patient was admitted to the hospital with a working diagnosis of acute hypoxic respiratory failure due to possible pulmonary edema/decompensated diastolic heart failure.    Assessment & Plan:   Principal Problem:   SOB (shortness of breath) Active Problems:   Type 2 diabetes mellitus with diabetic chronic kidney disease (HCC)   Asthmatic bronchitis   CHF (congestive heart failure) (HCC)   Chronic kidney disease   Lactic acidosis   1. Recurrent urinary tract infection, with multi drug resistant E. Coli, failed outpatient therapy, present on admission. Patient tolerating well IV ertapenem. She has  remained afebrile, no leukocytosis. Will plan to complete at least 3 doses IV and other 3 doses as outpatient, possible IM.   2. Acute on chronic diastolic heart failure decompensation, complicated with acute hypoxic respiratory failure. Continue po furosemide, with good toleration, continue blood pressure control.   3. T2DM. Noted low fating glucose this am at 65, will decrease basal insulin to 15 units qhs glargine/ and decreased premeal insulin to 3 units (aspart)  Continue glucose cover and monitoring with insulin sliding scale. Patient now is off steroids.   4. CKD stage 3. Renal function is stable with serum cr at 1,13 with K at 3,7 and serum bicarbonate at 24.   5. Obesity. Calculated bmi is 34,7.   6. HTN. On amlodipine, lisinopril, metoprolol, for blood pressure control.   7. COPD. On budesonide/ anoro. Stable dyspnea.    DVT prophylaxis: enoxaparin   Code Status: full Family Communication: I spoke with patient's daughter at the bedside and all questions were addressed.  Disposition Plan/ discharge barriers: pending clinical improvement     Body mass index is 34.95 kg/m. Malnutrition Type:      Malnutrition Characteristics:      Nutrition Interventions:     RN Pressure Injury Documentation:     Consultants:     Procedures:     Antimicrobials:   Ertapenem.     Subjective: Patient is feeling better, no nausea or vomiting, improved appetite. No chest pain or dyspnea.  Objective: Vitals:   05/08/18 1946 05/09/18 0500 05/09/18 0811 05/09/18 0842  BP: 118/62   (!) 117/59  Pulse: 74  67 66  Resp: 18  16   Temp: 97.8 F (36.6 C)     TempSrc: Oral  SpO2: 93%  95%   Weight:  70.7 kg    Height:        Intake/Output Summary (Last 24 hours) at 05/09/2018 0910 Last data filed at 05/09/2018 0816 Gross per 24 hour  Intake 700.21 ml  Output 400 ml  Net 300.21 ml   Filed Weights   05/07/18 1545 05/08/18 0020 05/09/18 0500  Weight: 70.4  kg 69.9 kg 70.7 kg    Examination:   General: deconditioned  Neurology: Awake and alert, non focal  E ENT: mild pallor, no icterus, oral mucosa moist Cardiovascular: No JVD. S1-S2 present, rhythmic, no gallops, rubs, or murmurs. No lower extremity edema. Pulmonary: positive breath sounds bilaterally, adequate air movement, no wheezing, rhonchi or rales. Gastrointestinal. Abdomen with no organomegaly, non tender, no rebound or guarding Skin. No rashes Musculoskeletal: no joint deformities     Data Reviewed: I have personally reviewed following labs and imaging studies  CBC: Recent Labs  Lab 05/03/18 1203 05/07/18 1049 05/08/18 0428 05/09/18 0702  WBC 8.1 6.4 6.7 8.5  NEUTROABS 4,803 3.8  --  5.1  HGB 13.0 12.1 12.2 12.1  HCT 40.2 36.2 37.6 37.1  MCV 83.1 83.0 82.1 81.7  PLT 242 195 166 400   Basic Metabolic Panel: Recent Labs  Lab 05/03/18 1203 05/07/18 1049 05/08/18 0428  NA 139 134* 135  K 5.2 4.5 4.3  CL 102 102 101  CO2 27 25 23   GLUCOSE 82 187* 152*  BUN 12 16 24*  CREATININE 0.98* 1.00 1.08*  CALCIUM 9.8 9.3 9.5   GFR: Estimated Creatinine Clearance: 32.3 mL/min (A) (by C-G formula based on SCr of 1.08 mg/dL (H)). Liver Function Tests: Recent Labs  Lab 05/03/18 1203 05/07/18 1049 05/08/18 0428  AST 27 23 23   ALT 20 20 19   ALKPHOS  --  69 56  BILITOT 0.3 0.5 0.5  PROT 6.6 6.2* 6.2*  ALBUMIN  --  3.5 3.3*   No results for input(s): LIPASE, AMYLASE in the last 168 hours. No results for input(s): AMMONIA in the last 168 hours. Coagulation Profile: No results for input(s): INR, PROTIME in the last 168 hours. Cardiac Enzymes: No results for input(s): CKTOTAL, CKMB, CKMBINDEX, TROPONINI in the last 168 hours. BNP (last 3 results) No results for input(s): PROBNP in the last 8760 hours. HbA1C: No results for input(s): HGBA1C in the last 72 hours. CBG: Recent Labs  Lab 05/08/18 1134 05/08/18 1639 05/08/18 2106 05/09/18 0623 05/09/18 0750   GLUCAP 264* 340* 216* 71 71   Lipid Profile: No results for input(s): CHOL, HDL, LDLCALC, TRIG, CHOLHDL, LDLDIRECT in the last 72 hours. Thyroid Function Tests: No results for input(s): TSH, T4TOTAL, FREET4, T3FREE, THYROIDAB in the last 72 hours. Anemia Panel: No results for input(s): VITAMINB12, FOLATE, FERRITIN, TIBC, IRON, RETICCTPCT in the last 72 hours.    Radiology Studies: I have reviewed all of the imaging during this hospital visit personally     Scheduled Meds: . amLODipine  5 mg Oral Daily  . aspirin EC  81 mg Oral Daily  . budesonide  0.25 mg Nebulization BID  . enoxaparin (LOVENOX) injection  40 mg Subcutaneous Q24H  . fluticasone  2 spray Each Nare Daily  . furosemide  20 mg Oral Daily  . insulin aspart  0-9 Units Subcutaneous TID WC  . insulin aspart  6 Units Subcutaneous TID WC  . insulin glargine  36 Units Subcutaneous QHS  . lisinopril  10 mg Oral Daily  . metoprolol tartrate  12.5  mg Oral BID  . pantoprazole  40 mg Oral Daily  . pravastatin  20 mg Oral Daily  . sucralfate  1 g Oral QID  . umeclidinium-vilanterol  1 puff Inhalation Daily   Continuous Infusions: . ertapenem 1,000 mg (05/08/18 1710)     LOS: 2 days        Lyanne Kates Gerome Apley, MD

## 2018-05-10 LAB — GLUCOSE, CAPILLARY
Glucose-Capillary: 156 mg/dL — ABNORMAL HIGH (ref 70–99)
Glucose-Capillary: 194 mg/dL — ABNORMAL HIGH (ref 70–99)
Glucose-Capillary: 237 mg/dL — ABNORMAL HIGH (ref 70–99)
Glucose-Capillary: 216 mg/dL — ABNORMAL HIGH (ref 70–99)

## 2018-05-10 LAB — CBC WITH DIFFERENTIAL/PLATELET
Abs Immature Granulocytes: 0.02 10*3/uL (ref 0.00–0.07)
Basophils Absolute: 0 10*3/uL (ref 0.0–0.1)
Basophils Relative: 1 %
Eosinophils Absolute: 0.1 10*3/uL (ref 0.0–0.5)
Eosinophils Relative: 2 %
HCT: 37.8 % (ref 36.0–46.0)
Hemoglobin: 11.9 g/dL — ABNORMAL LOW (ref 12.0–15.0)
IMMATURE GRANULOCYTES: 0 %
Lymphocytes Relative: 36 %
Lymphs Abs: 2.1 10*3/uL (ref 0.7–4.0)
MCH: 25.6 pg — ABNORMAL LOW (ref 26.0–34.0)
MCHC: 31.5 g/dL (ref 30.0–36.0)
MCV: 81.5 fL (ref 80.0–100.0)
Monocytes Absolute: 0.5 10*3/uL (ref 0.1–1.0)
Monocytes Relative: 9 %
Neutro Abs: 3.1 10*3/uL (ref 1.7–7.7)
Neutrophils Relative %: 52 %
Platelets: 169 10*3/uL (ref 150–400)
RBC: 4.64 MIL/uL (ref 3.87–5.11)
RDW: 15.3 % (ref 11.5–15.5)
WBC: 5.9 10*3/uL (ref 4.0–10.5)
nRBC: 0 % (ref 0.0–0.2)

## 2018-05-10 LAB — BASIC METABOLIC PANEL
ANION GAP: 8 (ref 5–15)
BUN: 26 mg/dL — ABNORMAL HIGH (ref 8–23)
CO2: 20 mmol/L — ABNORMAL LOW (ref 22–32)
Calcium: 8.9 mg/dL (ref 8.9–10.3)
Chloride: 106 mmol/L (ref 98–111)
Creatinine, Ser: 1.05 mg/dL — ABNORMAL HIGH (ref 0.44–1.00)
GFR calc Af Amer: 58 mL/min — ABNORMAL LOW (ref >60)
GFR calc non Af Amer: 50 mL/min — ABNORMAL LOW (ref >60)
Glucose, Bld: 145 mg/dL — ABNORMAL HIGH (ref 70–99)
POTASSIUM: 4.1 mmol/L (ref 3.5–5.1)
Sodium: 134 mmol/L — ABNORMAL LOW (ref 135–145)

## 2018-05-10 NOTE — Progress Notes (Signed)
PROGRESS NOTE    Joyce Gilmore  ZTI:[id number] DOB: [birth year] DOA: 05/07/2018 PCP: Alycia Rossetti, MD    Brief Narrative:  84 year old female who presented with dyspnea. She does have significant past medical history for asthma, type 2 diabetes mellitus, chronic diastolic heart failure, hypertension,chronic kidney disease stage II/III, pulmonary hypertension, dyslipidemia and a prior right prosthetic hip MRSA infection. He had a recent urinary tract infection and an asthmatic bronchitis exacerbation. She presented due to worsening dyspnea.On her initial physical examination she was hypoxic on ambulation 89%, her blood pressure 135/83, heart rate 59, respiratory rate18,oximetry with submental oxygen per nasal cannula 97%.Heart S1-S2 present, rhythmic, lungs with bilateral rails but no wheezing, abdomen soft non-distended, no lower extremity edema.Sodium 134, potassium 4.5, chloride 102, bicarb 25, glucose 187, BUN 16, creatinine 1.0,BNP 50,white count 6.4, hemoglobin 12.1, hematocrit 36.2, platelets 195,urinalysis with greater than 50 white cells.Her chest x-ray had increased lung markings bilaterally, chronic, small bibasilar atelectasis.Her EKG had 70 bpm, sinus rhythm, normal conduction, normal axis, normal intervals, no significant ST segment or T wave abnormalities.  Patient was admitted to the hospital with a working diagnosis of acute hypoxic respiratory failure due to possible pulmonary edema/decompensated diastolic heart failure.   Assessment & Plan:   Principal Problem:   SOB (shortness of breath) Active Problems:   Type 2 diabetes mellitus with diabetic chronic kidney disease (HCC)   Asthmatic bronchitis   CHF (congestive heart failure) (HCC)   Chronic kidney disease   Lactic acidosis   1. Recurrent urinary tract infection, with multi drug resistant E. Coli, failed outpatient therapy, present on admission. Continue with  IV ertapenem #3. No abdominal pain, no  nausea or vomiting. Denies any urinary frequency or dysuria.   2. Acute on chronic diastolic heart failure decompensation, complicated with acute hypoxic respiratory failure.  Tolerating well po furosemide. Her echocardiogram from 2017 had a preserved LV systolic function. Continue metoprolol and lisinopril. Discontinue telemetry.   3. T2DM. Improved fating glucose to 145, will continue glucose cover and monitoring with insulin sliding scale. Continue with lower dose of basal insulin and pre-meal short acting insulin.   4. CKD stage 3. Serum cr has been stable, today at 1,0 with K at 4,1 and Na at 135, will follow on renal panel in am.  5. Obesity. Calculated bmi is 34,7. Will need outpatient follow up.   6. HTN. Continue blood pressure control with amlodipine, lisinopril, metoprolol, for blood pressure control.   7. COPD. Continue with  budesonide/ anoro. Stable dyspnea.   DVT prophylaxis:enoxaparin Code Status:full Family Communication:I spoke with patient'ssonat the bedside and all questions were addressed.   Body mass index is 34.93 kg/m. Malnutrition Type:      Malnutrition Characteristics:      Nutrition Interventions:     RN Pressure Injury Documentation:     Consultants:     Procedures:     Antimicrobials:       Subjective: Patient is feeling better, no further urinary symptoms, no nausea or vomiting, no chest pain.   Objective: Vitals:   05/10/18 0306 05/10/18 0814 05/10/18 0838 05/10/18 1251  BP: (!) 128/94  (!) 140/55 (!) 121/98  Pulse: (!) 56  66 (!) 59  Resp: 13   18  Temp: 97.8 F (36.6 C)   (!) 97.4 F (36.3 C)  TempSrc: Oral   Oral  SpO2: 95% 96%  97%  Weight:      Height:        Intake/Output Summary (Last 24  hours) at 05/10/2018 1450 Last data filed at 05/10/2018 0810 Gross per 24 hour  Intake 720 ml  Output 500 ml  Net 220 ml   Filed Weights   05/08/18 0020 05/09/18 0500 05/10/18 0044  Weight: 69.9 kg 70.7  kg 70.7 kg    Examination:   General: Not in pain or dyspnea, deconditioned  Neurology: Awake and alert, non focal  E ENT: mild pallor, no icterus, oral mucosa moist Cardiovascular: No JVD. S1-S2 present, rhythmic, no gallops, rubs, or murmurs. No lower extremity edema. Pulmonary: positive breath sounds bilaterally, adequate air movement, no wheezing, rhonchi or rales. Gastrointestinal. Abdomen with no organomegaly, non tender, no rebound or guarding Skin. No rashes Musculoskeletal: no joint deformities     Data Reviewed: I have personally reviewed following labs and imaging studies  CBC: Recent Labs  Lab 05/07/18 1049 05/08/18 0428 05/09/18 0702 05/10/18 0502  WBC 6.4 6.7 8.5 5.9  NEUTROABS 3.8  --  5.1 3.1  HGB 12.1 12.2 12.1 11.9*  HCT 36.2 37.6 37.1 37.8  MCV 83.0 82.1 81.7 81.5  PLT 195 166 175 267   Basic Metabolic Panel: Recent Labs  Lab 05/07/18 1049 05/08/18 0428 05/09/18 0702 05/10/18 0502  NA 134* 135 136 134*  K 4.5 4.3 3.7 4.1  CL 102 101 102 106  CO2 25 23 26  20*  GLUCOSE 187* 152* 65* 145*  BUN 16 24* 24* 26*  CREATININE 1.00 1.08* 1.13* 1.05*  CALCIUM 9.3 9.5 9.0 8.9   GFR: Estimated Creatinine Clearance: 33.2 mL/min (A) (by C-G formula based on SCr of 1.05 mg/dL (H)). Liver Function Tests: Recent Labs  Lab 05/07/18 1049 05/08/18 0428  AST 23 23  ALT 20 19  ALKPHOS 69 56  BILITOT 0.5 0.5  PROT 6.2* 6.2*  ALBUMIN 3.5 3.3*   No results for input(s): LIPASE, AMYLASE in the last 168 hours. No results for input(s): AMMONIA in the last 168 hours. Coagulation Profile: No results for input(s): INR, PROTIME in the last 168 hours. Cardiac Enzymes: No results for input(s): CKTOTAL, CKMB, CKMBINDEX, TROPONINI in the last 168 hours. BNP (last 3 results) No results for input(s): PROBNP in the last 8760 hours. HbA1C: No results for input(s): HGBA1C in the last 72 hours. CBG: Recent Labs  Lab 05/09/18 1148 05/09/18 1644 05/09/18 2130  05/10/18 0556 05/10/18 1207  GLUCAP 156* 137* 202* 156* 194*   Lipid Profile: No results for input(s): CHOL, HDL, LDLCALC, TRIG, CHOLHDL, LDLDIRECT in the last 72 hours. Thyroid Function Tests: No results for input(s): TSH, T4TOTAL, FREET4, T3FREE, THYROIDAB in the last 72 hours. Anemia Panel: No results for input(s): VITAMINB12, FOLATE, FERRITIN, TIBC, IRON, RETICCTPCT in the last 72 hours.    Radiology Studies: I have reviewed all of the imaging during this hospital visit personally     Scheduled Meds: . amLODipine  5 mg Oral Daily  . aspirin EC  81 mg Oral Daily  . budesonide  0.25 mg Nebulization BID  . enoxaparin (LOVENOX) injection  40 mg Subcutaneous Q24H  . fluticasone  2 spray Each Nare Daily  . furosemide  20 mg Oral Daily  . insulin aspart  0-9 Units Subcutaneous TID WC  . insulin aspart  3 Units Subcutaneous TID WC  . insulin glargine  15 Units Subcutaneous QHS  . lisinopril  10 mg Oral Daily  . metoprolol tartrate  12.5 mg Oral BID  . pantoprazole  40 mg Oral Daily  . pravastatin  20 mg Oral Daily  . sucralfate  1 g Oral QID  . umeclidinium-vilanterol  1 puff Inhalation Daily   Continuous Infusions: . ertapenem 1,000 mg (05/09/18 1453)     LOS: 3 days        Jessenya Berdan Gerome Apley, MD

## 2018-05-10 NOTE — Progress Notes (Signed)
Cardiac monitor d/c as order by MD. CCMD made aware by RN.

## 2018-05-10 NOTE — Plan of Care (Signed)
  Problem: Education: Goal: Knowledge of General Education information will improve Description: Including pain rating scale, medication(s)/side effects and non-pharmacologic comfort measures Outcome: Progressing   Problem: Health Behavior/Discharge Planning: Goal: Ability to manage health-related needs will improve Outcome: Progressing   Problem: Clinical Measurements: Goal: Will remain free from infection Outcome: Progressing   Problem: Activity: Goal: Risk for activity intolerance will decrease Outcome: Progressing   Problem: Nutrition: Goal: Adequate nutrition will be maintained Outcome: Progressing   Problem: Pain Managment: Goal: General experience of comfort will improve Outcome: Progressing   

## 2018-05-10 NOTE — Evaluation (Signed)
Physical Therapy Evaluation Patient Details Name: Joyce Gilmore MRN: [id number] DOB: 1934/08/19 Today's Date: 05/10/2018   History of Present Illness  84 year old female who presented with dyspnea.  She does have significant past medical history for asthma, type 2 diabetes mellitus, chronic diastolic heart failure, hypertension, chronic kidney disease stage II/III, pulmonary hypertension, dyslipidemia and a prior right prosthetic hip MRSA infection.  He had a recent urinary tract infection and an asthmatic bronchitis exacerbation.  She presented due to worsening dyspnea.   Clinical Impression  Patient presents with generalized weakness limiting independence with mobility.  Currently minguard for mobility with single point cane.  Feel she will benefit from skilled PT in the acute setting to allow return home with intermittent family support.  Likely no follow up PT needs.      Follow Up Recommendations No PT follow up    Equipment Recommendations  None recommended by PT    Recommendations for Other Services       Precautions / Restrictions Precautions Precautions: Fall      Mobility  Bed Mobility Overal bed mobility: Needs Assistance Bed Mobility: Sidelying to Sit   Sidelying to sit: HOB elevated       General bed mobility comments: minguard for trunk elevation  Transfers Overall transfer level: Needs assistance Equipment used: None Transfers: Sit to/from Stand Sit to Stand: Min guard         General transfer comment: for balance, with cane once standing  Ambulation/Gait Ambulation/Gait assistance: Min guard Gait Distance (Feet): 200 Feet Assistive device: Straight cane Gait Pattern/deviations: Step-through pattern;Wide base of support;Decreased stride length;Trendelenburg     General Gait Details: trendelenberg on R, some instability and pt reports slightly weaker than normal  Stairs            Wheelchair Mobility    Modified Rankin (Stroke Patients  Only)       Balance Overall balance assessment: Needs assistance   Sitting balance-Leahy Scale: Good     Standing balance support: During functional activity Standing balance-Leahy Scale: Good Standing balance comment: standing to brush teeth/wash hands at sink                              Pertinent Vitals/Pain Pain Assessment: No/denies pain    Home Living Family/patient expects to be discharged to:: Private residence Living Arrangements: Children Available Help at Discharge: Family;Available PRN/intermittently Type of Home: House Home Access: Stairs to enter Entrance Stairs-Rails: Right Entrance Stairs-Number of Steps: 2 Home Layout: One level Home Equipment: Cane - single point      Prior Function Level of Independence: Independent with assistive device(s)         Comments: ambulates with cane     Hand Dominance   Dominant Hand: Right    Extremity/Trunk Assessment   Upper Extremity Assessment Upper Extremity Assessment: Generalized weakness    Lower Extremity Assessment Lower Extremity Assessment: Generalized weakness    Cervical / Trunk Assessment Cervical / Trunk Assessment: Kyphotic  Communication   Communication: HOH;Prefers language other than English;Interpreter utilized(Spanish)  Cognition Arousal/Alertness: Awake/alert Behavior During Therapy: WFL for tasks assessed/performed Overall Cognitive Status: Within Functional Limits for tasks assessed                                        General Comments General comments (skin integrity, edema, etc.): daughter present and  interprets for pt, pt coughing and productive of clear to whitish sputum, SpO2 with ambulation on RA 96%, HR 101, SpO2 on RA at rest 92%    Exercises     Assessment/Plan    PT Assessment Patient needs continued PT services  PT Problem List Decreased strength;Decreased mobility;Decreased activity tolerance       PT Treatment Interventions  DME instruction;Therapeutic exercise;Gait training;Balance training;Functional mobility training;Therapeutic activities;Stair training;Patient/family education    PT Goals (Current goals can be found in the Care Plan section)  Acute Rehab PT Goals Patient Stated Goal: to return to independent PT Goal Formulation: With patient/family Time For Goal Achievement: 05/17/18 Potential to Achieve Goals: Good    Frequency Min 3X/week   Barriers to discharge        Co-evaluation               AM-PAC PT "6 Clicks" Mobility  Outcome Measure Help needed turning from your back to your side while in a flat bed without using bedrails?: None Help needed moving from lying on your back to sitting on the side of a flat bed without using bedrails?: A Little Help needed moving to and from a bed to a chair (including a wheelchair)?: A Little Help needed standing up from a chair using your arms (e.g., wheelchair or bedside chair)?: A Little Help needed to walk in hospital room?: A Little Help needed climbing 3-5 steps with a railing? : A Little 6 Click Score: 19    End of Session   Activity Tolerance: Patient tolerated treatment well Patient left: in chair;with call bell/phone within reach;with family/visitor present   PT Visit Diagnosis: Muscle weakness (generalized) (M62.81)    Time: 4128-7867 PT Time Calculation (min) (ACUTE ONLY): 24 min   Charges:   PT Evaluation $PT Eval Low Complexity: 1 Low PT Treatments $Gait Training: 8-22 mins        Magda Kiel, [place] [phone] 05/10/2018   Reginia Naas 05/10/2018, 9:43 AM

## 2018-05-11 LAB — GLUCOSE, CAPILLARY
Glucose-Capillary: 193 mg/dL — ABNORMAL HIGH (ref 70–99)
Glucose-Capillary: 248 mg/dL — ABNORMAL HIGH (ref 70–99)

## 2018-05-11 NOTE — Discharge Summary (Signed)
Physician Discharge Summary  Joyce Gilmore ZOX:[id number] DOB: 08-02-1934 DOA: 05/07/2018  PCP: Alycia Rossetti, MD  Admit date: 05/07/2018 Discharge date: 05/11/2018  Admitted From: Home  Disposition:  Home   Recommendations for Outpatient Follow-up and new medication changes:  1. Follow up with Dr. Buelah Manis in 7 days.  2. Patient received 4 doses of IV ertapenem while hospitalized. 3. Advised about sodium and fluid restricted diet.  4. Will stop naproxen in the setting of chronic kidney disease.   Home Health: No  Equipment/Devices: NO    Discharge Condition: stable  CODE STATUS: full  Diet recommendation: heart healthy   Brief/Interim Summary: 84 year old female who presented with dyspnea. She does have significant past medical history for asthma, type 2 diabetes mellitus, chronic diastolic heart failure, hypertension,chronic kidney disease stage II/III, pulmonary hypertension, dyslipidemia and a prior right prosthetic hip MRSA infection. She had a recent urinary tract infection and an asthmatic bronchitis exacerbation. She presented due to worsening dyspnea.On her initial physical examination she was hypoxic on ambulation 89%, her blood pressure 135/83, heart rate 59, respiratory rate18,oximetry with supplemental oxygen per nasal cannula 97%.Heart S1-S2 present, rhythmic, lungs with bilateral rales but no wheezing, abdomen soft non-distended, no lower extremity edema.Sodium 134, potassium 4.5, chloride 102, bicarb 25, glucose 187, BUN 16, creatinine 1.0,BNP 50,white count 6.4, hemoglobin 12.1, hematocrit 36.2, platelets 195,urinalysis with greater than 50 white cells.Her chest x-ray had increased lung markings bilaterally, chronic, small bibasilar atelectasis.Her EKG had 70 bpm, sinus rhythm, normal conduction, normal axis, normal intervals, no significant ST segment or T wave abnormalities.  Patient was admitted to the hospital with a working diagnosis of acute hypoxic  respiratory failure due to possible pulmonary edema/decompensated diastolic heart failure.  1.  Acute on chronic diastolic heart failure decompensation, complicated by acute hypoxic respiratory failure /ejection fraction 60-65%.  Patient was admitted to a telemetry unit, received diuresis with furosemide, with significant improvement of her symptoms.  Patient will continue blood pressure control with lisinopril, beta-blockade with metoprolol.  Daily furosemide.  She was advised about sodium and fluid restriction.  2.  Recurrent urinary tract infection, multidrug resistant E. coli, ESBL, failed outpatient therapy, present on admission.  Patient received 4 doses of ertapenem, no further urinary symptoms, no abdominal pain, patient has been afebrile with no leukocytosis.  Decision was made not to continue further antibiotic therapy, discussed over the phone with infectious disease.  She will need close follow-up as an outpatient, avoid antibiotic unless patient has clear urinary tract infection symptoms.  3.  Chronic kidney disease stage III.  Renal function remained stable, patient tolerated diuresis well, continue oral furosemide 20 m daily.  4.  Type 2 diabetes mellitus.  Patient was placed on insulin sliding scale for glucose coverage and monitoring, her home dose had to be reduced to avoid hypoglycemia in her hospitalization.  At discharge she will resume her regular doses. Insulin glargine 36 units qhs, pre-meal and sliding scale short acting insulin.   5.  Hypertension.  Continue blood pressure control with amlodipine, lisinopril and metoprolol.  6.  COPD.  Stable with no signs of exacerbation, continue outpatient bronchodilator therapy and inhaled corticosteroids.   7.  Obesity.  Calculated BMI 34.7.  8. Dyslipidemia. Continue atorvastatin.    Discharge Diagnoses:  Principal Problem:   SOB (shortness of breath) Active Problems:   Type 2 diabetes mellitus with diabetic chronic kidney  disease (HCC)   Asthmatic bronchitis   CHF (congestive heart failure) (HCC)   Chronic kidney disease  Lactic acidosis    Discharge Instructions   Allergies as of 05/11/2018      Reactions   Bactrim [sulfamethoxazole-trimethoprim] Other (See Comments)   Hyperkalemia (elevated potassium)   Rifampin Other (See Comments)   Severe thrombocytopenia (low blood platelet count)   Vancomycin Other (See Comments)   Severe thrombocytopenia (low blood platelet count)      Medication List    STOP taking these medications   naproxen sodium 220 MG tablet Commonly known as:  ALEVE     TAKE these medications   Accu-Chek Softclix Lancets lancets USE AS DIRECTED   albuterol 108 (90 Base) MCG/ACT inhaler Commonly known as:  PROVENTIL HFA;VENTOLIN HFA Inhale 1-2 puffs into the lungs every 4 (four) hours as needed for wheezing or shortness of breath.   albuterol (2.5 MG/3ML) 0.083% nebulizer solution Commonly known as:  PROVENTIL Take 3 mLs (2.5 mg total) by nebulization every 6 (six) hours as needed for wheezing or shortness of breath.   amLODipine 5 MG tablet Commonly known as:  NORVASC TAKE 1 TABLET BY MOUTH DAILY   aspirin EC 81 MG tablet Take 81 mg by mouth daily.   benzonatate 100 MG capsule Commonly known as:  TESSALON Take 1 capsule (100 mg total) by mouth 3 (three) times daily as needed for cough. Spainish   blood glucose meter kit and supplies Kit Dispense based on patient and insurance preference. Use up to four times daily as directed. (FOR ICD-9 250.00, 250.01).   calcium carbonate 1500 (600 Ca) MG Tabs tablet Commonly known as:  OSCAL Take 600 mg of elemental calcium by mouth 2 (two) times daily with a meal.   fluticasone 50 MCG/ACT nasal spray Commonly known as:  FLONASE Place 2 sprays into both nostrils daily.   Fluticasone Furoate 100 MCG/ACT Aepb Commonly known as:  Arnuity Ellipta Inhale 1 puff into the lungs daily.   furosemide 20 MG tablet Commonly  known as:  LASIX Take 1 tablet (20 mg total) by mouth daily. For fluid - spanish speaking   glucose blood test strip Commonly known as:  Accu-Chek Aviva Plus USE TO CHECK BLOOD SUGAR FOUR TIMES DAILY AS DIRECTED   Accu-Chek Aviva Plus test strip Generic drug:  glucose blood USE TO CHECK BLOOD SUGAR FOUR TIMES DAILY AS DIRECTED   Insulin Glargine 100 UNIT/ML Solostar Pen Commonly known as:  Lantus SoloStar Inject 36 Units into the skin at bedtime.   Insulin Pen Needle 31G X 8 MM Misc Commonly known as:  B-D ULTRAFINE III SHORT PEN USE AS DIRECTED   lansoprazole 30 MG capsule Commonly known as:  PREVACID TAKE 1 CAPSULE BY MOUTH TWICE DAILY BEFORE A MEAL What changed:    how much to take  how to take this  when to take this  additional instructions   lisinopril 10 MG tablet Commonly known as:  PRINIVIL,ZESTRIL Take 1 tablet (10 mg total) by mouth daily.   metoprolol tartrate 25 MG tablet Commonly known as:  LOPRESSOR Take 0.5 tablets (12.5 mg total) by mouth 2 (two) times daily.   NovoLOG FlexPen 100 UNIT/ML FlexPen Generic drug:  insulin aspart FOLLOW SLIDING SCALE AS DIRECTED What changed:  See the new instructions.   insulin aspart 100 UNIT/ML FlexPen Commonly known as:  NovoLOG FlexPen FOLLOW SLIDING SCALE AS DIRECTED~ inject 5-15 U as directed. What changed:    how much to take  how to take this  when to take this  additional instructions   polyvinyl alcohol 1.4 % ophthalmic  solution Commonly known as:  Artificial Tears Place 1 drop into both eyes as needed for dry eyes.   pravastatin 20 MG tablet Commonly known as:  PRAVACHOL Take 1 tablet (20 mg total) by mouth daily.   sucralfate 1 g tablet Commonly known as:  CARAFATE Take 1 tablet (1 g total) by mouth 4 (four) times daily.   traMADol 50 MG tablet Commonly known as:  ULTRAM Take 1 tablet (50 mg total) by mouth 2 (two) times daily as needed. Chronic pain What changed:    reasons to take  this  additional instructions   TYLENOL PO Take 1,000 tablets by mouth every 6 (six) hours as needed (pain/headache).       Allergies  Allergen Reactions  . Bactrim [Sulfamethoxazole-Trimethoprim] Other (See Comments)    Hyperkalemia (elevated potassium)  . Rifampin Other (See Comments)    Severe thrombocytopenia (low blood platelet count)  . Vancomycin Other (See Comments)    Severe thrombocytopenia (low blood platelet count)    Consultations:     Procedures/Studies: Dg Chest 2 View  Result Date: 05/07/2018 CLINICAL DATA:  Wheezing. EXAM: CHEST - 2 VIEW COMPARISON:  03/06/2017 and prior radiographs FINDINGS: Cardiomegaly and mild pulmonary vascular congestion noted. This is a mildly low volume film with mild bibasilar atelectasis. Possible trace bilateral pleural effusions noted. There is no evidence of focal airspace disease, pulmonary edema, suspicious pulmonary nodule/mass or pneumothorax. No acute bony abnormalities are identified. IMPRESSION: Cardiomegaly with mild pulmonary vascular congestion, mild bibasilar atelectasis and possible trace bilateral pleural effusions. Electronically Signed   By: Margarette Canada M.D.   On: 05/07/2018 12:40       Subjective: Patient is feeling better, no nausea or vomiting, no chest pain or dyspnea, denies urinary symptoms. Has been afebrile.   Discharge Exam: Vitals:   05/11/18 0738 05/11/18 0852  BP:  139/62  Pulse:  64  Resp:    Temp:    SpO2: 95%    Vitals:   05/11/18 0121 05/11/18 0409 05/11/18 0738 05/11/18 0852  BP: (!) 150/80 (!) 143/67  139/62  Pulse: 67 64  64  Resp: 20 18    Temp: 97.8 F (36.6 C) 98.5 F (36.9 C)    TempSrc: Oral Oral    SpO2: 99% 96% 95%   Weight:  71 kg    Height:        General: Not in pain or dyspnea, Neurology: Awake and alert, non focal  E ENT: no pallor, no icterus, oral mucosa moist Cardiovascular: No JVD. S1-S2 present, rhythmic, no gallops, rubs, or murmurs. No lower extremity  edema. Pulmonary: Positive breath sounds bilaterally, adequate air movement, no wheezing, rhonchi or rales. Gastrointestinal. Abdomen with no organomegaly, non tender, no rebound or guarding Skin. No rashes Musculoskeletal: no joint deformities   The results of significant diagnostics from this hospitalization (including imaging, microbiology, ancillary and laboratory) are listed below for reference.     Microbiology: Recent Results (from the past 240 hour(s))  Urine Culture     Status: Abnormal   Collection Time: 05/03/18 12:03 PM  Result Value Ref Range Status   MICRO NUMBER: [id number]  Final   SPECIMEN QUALITY: Adequate  Final   Sample Source URINE  Final   STATUS: FINAL  Final   ISOLATE 1: ESBL Escherichia coli (A)  Final    Comment: Greater than 100,000 CFU/mL of Escherichia coli (ESBL) ESBL RESULT:        The organism has been confirmed as an ESBL producer.  Susceptibility   Esbl escherichia coli - URINE CULTURE, REFLEX    AMOX/CLAVULANIC >=32 Resistant     AMPICILLIN* >=32 Resistant      * Extended spectrum beta-lactamase (ESBL) producingorganisms demonstrate decreased activity withpenicillins, cephalosporins and aztreonam.    AMPICILLIN/SULBACTAM >=32 Resistant     CEFAZOLIN* >=64 Resistant      * Extended spectrum beta-lactamase (ESBL) producingorganisms demonstrate decreased activity withpenicillins, cephalosporins and aztreonam.For uncomplicated UTI caused by E. coli,K. pneumoniae or P. mirabilis: Cefazolin issusceptible if MIC <32 mcg/mL and predictssusceptible to the oral agents cefaclor, cefdinir,cefpodoxime, cefprozil, cefuroxime, cephalexinand loracarbef.    CEFEPIME 8 Resistant     CEFTRIAXONE >=64 Resistant     CIPROFLOXACIN >=4 Resistant     LEVOFLOXACIN >=8 Resistant     ERTAPENEM <=0.5 Sensitive     GENTAMICIN <=1 Sensitive     IMIPENEM <=0.25 Sensitive     NITROFURANTOIN 32 Sensitive     PIP/TAZO 8 Sensitive     TOBRAMYCIN >=16 Resistant      TRIMETH/SULFA* >=320 Resistant      * Extended spectrum beta-lactamase (ESBL) producingorganisms demonstrate decreased activity withpenicillins, cephalosporins and aztreonam.For uncomplicated UTI caused by E. coli,K. pneumoniae or P. mirabilis: Cefazolin issusceptible if MIC <32 mcg/mL and predictssusceptible to the oral agents cefaclor, cefdinir,cefpodoxime, cefprozil, cefuroxime, cephalexinand loracarbef.Legend:S = Susceptible  I = IntermediateR = Resistant  NS = Not susceptible* = Not tested  NR = Not reported**NN = See antimicrobic comments  Urine culture     Status: Abnormal   Collection Time: 05/07/18 11:36 AM  Result Value Ref Range Status   Specimen Description URINE, RANDOM  Final   Special Requests   Final    NONE Performed at [facility], [place] [address], [place], [place] [address]    Culture (A)  Final    >=100,000 COLONIES/mL ESCHERICHIA COLI Confirmed Extended Spectrum Beta-Lactamase Producer (ESBL).  In bloodstream infections from ESBL organisms, carbapenems are preferred over piperacillin/tazobactam. They are shown to have a lower risk of mortality.    Report Status 05/09/2018 FINAL  Final   Organism ID, Bacteria ESCHERICHIA COLI (A)  Final      Susceptibility   Escherichia coli - MIC*    AMPICILLIN >=32 RESISTANT Resistant     CEFAZOLIN >=64 RESISTANT Resistant     CEFTRIAXONE >=64 RESISTANT Resistant     CIPROFLOXACIN >=4 RESISTANT Resistant     GENTAMICIN <=1 SENSITIVE Sensitive     IMIPENEM <=0.25 SENSITIVE Sensitive     NITROFURANTOIN 64 INTERMEDIATE Intermediate     TRIMETH/SULFA >=320 RESISTANT Resistant     AMPICILLIN/SULBACTAM >=32 RESISTANT Resistant     PIP/TAZO 8 SENSITIVE Sensitive     Extended ESBL POSITIVE Resistant     * >=100,000 COLONIES/mL ESCHERICHIA COLI     Labs: BNP (last 3 results) Recent Labs    05/07/18 1049  BNP 00.3   Basic Metabolic Panel: Recent Labs  Lab 05/07/18 1049 05/08/18 0428 05/09/18 0702 05/10/18 0502  NA 134*  135 136 134*  K 4.5 4.3 3.7 4.1  CL 102 101 102 106  CO2 '25 23 26 '$ 20*  GLUCOSE 187* 152* 65* 145*  BUN 16 24* 24* 26*  CREATININE 1.00 1.08* 1.13* 1.05*  CALCIUM 9.3 9.5 9.0 8.9   Liver Function Tests: Recent Labs  Lab 05/07/18 1049 05/08/18 0428  AST 23 23  ALT 20 19  ALKPHOS 69 56  BILITOT 0.5 0.5  PROT 6.2* 6.2*  ALBUMIN 3.5 3.3*   No results for input(s): LIPASE,  AMYLASE in the last 168 hours. No results for input(s): AMMONIA in the last 168 hours. CBC: Recent Labs  Lab 05/07/18 1049 05/08/18 0428 05/09/18 0702 05/10/18 0502  WBC 6.4 6.7 8.5 5.9  NEUTROABS 3.8  --  5.1 3.1  HGB 12.1 12.2 12.1 11.9*  HCT 36.2 37.6 37.1 37.8  MCV 83.0 82.1 81.7 81.5  PLT 195 166 175 169   Cardiac Enzymes: No results for input(s): CKTOTAL, CKMB, CKMBINDEX, TROPONINI in the last 168 hours. BNP: Invalid input(s): POCBNP CBG: Recent Labs  Lab 05/10/18 0556 05/10/18 1207 05/10/18 1607 05/10/18 2206 05/11/18 0625  GLUCAP 156* 194* 237* 216* 193*   D-Dimer No results for input(s): DDIMER in the last 72 hours. Hgb A1c No results for input(s): HGBA1C in the last 72 hours. Lipid Profile No results for input(s): CHOL, HDL, LDLCALC, TRIG, CHOLHDL, LDLDIRECT in the last 72 hours. Thyroid function studies No results for input(s): TSH, T4TOTAL, T3FREE, THYROIDAB in the last 72 hours.  Invalid input(s): FREET3 Anemia work up No results for input(s): VITAMINB12, FOLATE, FERRITIN, TIBC, IRON, RETICCTPCT in the last 72 hours. Urinalysis    Component Value Date/Time   COLORURINE YELLOW 05/07/2018 1140   APPEARANCEUR HAZY (A) 05/07/2018 1140   LABSPEC 1.006 05/07/2018 1140   PHURINE 6.0 05/07/2018 1140   GLUCOSEU NEGATIVE 05/07/2018 1140   HGBUR NEGATIVE 05/07/2018 1140   BILIRUBINUR NEGATIVE 05/07/2018 1140   KETONESUR NEGATIVE 05/07/2018 1140   PROTEINUR NEGATIVE 05/07/2018 1140   UROBILINOGEN 0.2 09/24/2014 0422   NITRITE POSITIVE (A) 05/07/2018 1140   LEUKOCYTESUR LARGE  (A) 05/07/2018 1140   Sepsis Labs Invalid input(s): PROCALCITONIN,  WBC,  LACTICIDVEN Microbiology Recent Results (from the past 240 hour(s))  Urine Culture     Status: Abnormal   Collection Time: 05/03/18 12:03 PM  Result Value Ref Range Status   MICRO NUMBER: [id number]  Final   SPECIMEN QUALITY: Adequate  Final   Sample Source URINE  Final   STATUS: FINAL  Final   ISOLATE 1: ESBL Escherichia coli (A)  Final    Comment: Greater than 100,000 CFU/mL of Escherichia coli (ESBL) ESBL RESULT:        The organism has been confirmed as an ESBL producer.      Susceptibility   Esbl escherichia coli - URINE CULTURE, REFLEX    AMOX/CLAVULANIC >=32 Resistant     AMPICILLIN* >=32 Resistant      * Extended spectrum beta-lactamase (ESBL) producingorganisms demonstrate decreased activity withpenicillins, cephalosporins and aztreonam.    AMPICILLIN/SULBACTAM >=32 Resistant     CEFAZOLIN* >=64 Resistant      * Extended spectrum beta-lactamase (ESBL) producingorganisms demonstrate decreased activity withpenicillins, cephalosporins and aztreonam.For uncomplicated UTI caused by E. coli,K. pneumoniae or P. mirabilis: Cefazolin issusceptible if MIC <32 mcg/mL and predictssusceptible to the oral agents cefaclor, cefdinir,cefpodoxime, cefprozil, cefuroxime, cephalexinand loracarbef.    CEFEPIME 8 Resistant     CEFTRIAXONE >=64 Resistant     CIPROFLOXACIN >=4 Resistant     LEVOFLOXACIN >=8 Resistant     ERTAPENEM <=0.5 Sensitive     GENTAMICIN <=1 Sensitive     IMIPENEM <=0.25 Sensitive     NITROFURANTOIN 32 Sensitive     PIP/TAZO 8 Sensitive     TOBRAMYCIN >=16 Resistant     TRIMETH/SULFA* >=320 Resistant      * Extended spectrum beta-lactamase (ESBL) producingorganisms demonstrate decreased activity withpenicillins, cephalosporins and aztreonam.For uncomplicated UTI caused by E. coli,K. pneumoniae or P. mirabilis: Cefazolin issusceptible if MIC <32 mcg/mL and predictssusceptible to the  oral agents cefaclor,  cefdinir,cefpodoxime, cefprozil, cefuroxime, cephalexinand loracarbef.Legend:S = Susceptible  I = IntermediateR = Resistant  NS = Not susceptible* = Not tested  NR = Not reported**NN = See antimicrobic comments  Urine culture     Status: Abnormal   Collection Time: 05/07/18 11:36 AM  Result Value Ref Range Status   Specimen Description URINE, RANDOM  Final   Special Requests   Final    NONE Performed at [facility], [place] [address] [place], [place] [address]    Culture (A)  Final    >=100,000 COLONIES/mL ESCHERICHIA COLI Confirmed Extended Spectrum Beta-Lactamase Producer (ESBL).  In bloodstream infections from ESBL organisms, carbapenems are preferred over piperacillin/tazobactam. They are shown to have a lower risk of mortality.    Report Status 05/09/2018 FINAL  Final   Organism ID, Bacteria ESCHERICHIA COLI (A)  Final      Susceptibility   Escherichia coli - MIC*    AMPICILLIN >=32 RESISTANT Resistant     CEFAZOLIN >=64 RESISTANT Resistant     CEFTRIAXONE >=64 RESISTANT Resistant     CIPROFLOXACIN >=4 RESISTANT Resistant     GENTAMICIN <=1 SENSITIVE Sensitive     IMIPENEM <=0.25 SENSITIVE Sensitive     NITROFURANTOIN 64 INTERMEDIATE Intermediate     TRIMETH/SULFA >=320 RESISTANT Resistant     AMPICILLIN/SULBACTAM >=32 RESISTANT Resistant     PIP/TAZO 8 SENSITIVE Sensitive     Extended ESBL POSITIVE Resistant     * >=100,000 COLONIES/mL ESCHERICHIA COLI     Time coordinating discharge: 45 minutes  SIGNED:   Tawni Millers, MD  [facility] 05/11/2018, 9:17 AM

## 2018-05-11 NOTE — Progress Notes (Signed)
Pt discharge instructions reviewed with pt and family via interpretor. Pt and family verbalize understanding and state they have no questions. Pt belongings with pt. Pt is not in distress. Pt discharged via wheelchair. Pt's family is driving her home.

## 2018-05-14 ENCOUNTER — Other Ambulatory Visit: Payer: Self-pay

## 2018-05-14 ENCOUNTER — Ambulatory Visit (INDEPENDENT_AMBULATORY_CARE_PROVIDER_SITE_OTHER): Payer: Medicaid Other | Admitting: Family Medicine

## 2018-05-14 VITALS — BP 116/58 | HR 75 | Temp 98.5°F | Resp 18 | Wt 161.2 lb

## 2018-05-14 DIAGNOSIS — Z794 Long term (current) use of insulin: Secondary | ICD-10-CM | POA: Diagnosis not present

## 2018-05-14 DIAGNOSIS — I5033 Acute on chronic diastolic (congestive) heart failure: Secondary | ICD-10-CM | POA: Diagnosis not present

## 2018-05-14 DIAGNOSIS — E1122 Type 2 diabetes mellitus with diabetic chronic kidney disease: Secondary | ICD-10-CM

## 2018-05-14 DIAGNOSIS — B9620 Unspecified Escherichia coli [E. coli] as the cause of diseases classified elsewhere: Secondary | ICD-10-CM | POA: Diagnosis not present

## 2018-05-14 DIAGNOSIS — I5032 Chronic diastolic (congestive) heart failure: Secondary | ICD-10-CM | POA: Diagnosis not present

## 2018-05-14 DIAGNOSIS — I10 Essential (primary) hypertension: Secondary | ICD-10-CM

## 2018-05-14 DIAGNOSIS — N390 Urinary tract infection, site not specified: Secondary | ICD-10-CM

## 2018-05-14 DIAGNOSIS — N182 Chronic kidney disease, stage 2 (mild): Secondary | ICD-10-CM | POA: Diagnosis not present

## 2018-05-14 MED ORDER — TRAMADOL HCL 50 MG PO TABS: 50.0000 mg | ORAL_TABLET | Freq: Two times a day (BID) | ORAL | 0 refills | Status: DC | PRN

## 2018-05-14 NOTE — Patient Instructions (Addendum)
Increase Lantus to 40 units at bedtime Take ultram for pain or Tylenol No aleve or advil/ibuprofen We will call with lab results  F/U as previous

## 2018-05-14 NOTE — Progress Notes (Signed)
   Subjective:    Patient ID: Joyce Gilmore, female    DOB: [birth year], 84 y.o.   MRN: [id number]  Patient presents for Hospitalization Follow-up (uti)   Pt here for hospital follow up, unfortunately with the COVID-19, no interpreter available, so internet translator used and her daughter.    E COLI- multiple resistance, she was treated with fosfomycin, no current symptoms, no fever, no abd pain, appetite improved   Acute on chronic diastolic heart failure- unclear if she was taking the lasix regulary, restarted daily, no fluid on legs, denies SOB, cough  DM- she has still been taking 38 units of lantus, though we just had visit telling her to take 40 units, A1C 8.7%, novolog 6 units with meals   Review Of Systems:  GEN- denies fatigue, fever, weight loss,weakness, recent illness HEENT- denies eye drainage, change in vision, nasal discharge, CVS- denies chest pain, palpitations RESP- denies SOB, cough, wheeze ABD- denies N/V, change in stools, abd pain GU- denies dysuria, hematuria, dribbling, incontinence MSK- denies joint pain, muscle aches, injury Neuro- denies headache, dizziness, syncope, seizure activity       Objective:    BP (!) 116/58   Pulse 75   Temp 98.5 F (36.9 C)   Resp 18   Wt 161 lb 3.2 oz (73.1 kg)   SpO2 92%   BMI 36.14 kg/m  GEN- NAD, alert and oriented x3 HEENT- PERRL, EOMI, non injected sclera, pink conjunctiva, MMM, oropharynx clear, nares clear  Neck- Supple, no thyromegaly, no JVD CVS- RRR, no murmur RESP-CTAB ABD-NABS,soft,NT,ND EXT- No edema Pulses- Radial, DP- 2+        Assessment & Plan:      Problem List Items Addressed This Visit      Unprioritized   RESOLVED: CHF (congestive heart failure) (HCC) - Primary   Chronic diastolic heart failure (HCC)    Currently at baseline advised to take lasix daily Recheck NA and K level today       Essential hypertension    Controlled no changes      Type 2 diabetes mellitus with diabetic  chronic kidney disease (HCC)    Again, increase lantus to 40 units, no change to novolog 6 units with each meal      Relevant Orders   CBC with Differential/Platelet (Completed)   Basic metabolic panel (Completed)    Other Visit Diagnoses    E. coli UTI       recheck culture if still present, she is asymptomatic and is colonized, only treat for true symptoms   Relevant Orders   Urine Culture      Note: This dictation was prepared with Dragon dictation along with smaller phrase technology. Any transcriptional errors that result from this process are unintentional.

## 2018-05-15 LAB — CBC WITH DIFFERENTIAL/PLATELET
Absolute Monocytes: 491 cells/uL (ref 200–950)
BASOS ABS: 38 {cells}/uL (ref 0–200)
Basophils Relative: 0.7 %
EOS ABS: 151 {cells}/uL (ref 15–500)
Eosinophils Relative: 2.8 %
HCT: 37.7 % (ref 35.0–45.0)
Hemoglobin: 12.1 g/dL (ref 11.7–15.5)
Lymphs Abs: 1534 cells/uL (ref 850–3900)
MCH: 26.9 pg — AB (ref 27.0–33.0)
MCHC: 32.1 g/dL (ref 32.0–36.0)
MCV: 84 fL (ref 80.0–100.0)
MPV: 10.9 fL (ref 7.5–12.5)
Monocytes Relative: 9.1 %
Neutro Abs: 3186 cells/uL (ref 1500–7800)
Neutrophils Relative %: 59 %
Platelets: 160 10*3/uL (ref 140–400)
RBC: 4.49 10*6/uL (ref 3.80–5.10)
RDW: 15.2 % — ABNORMAL HIGH (ref 11.0–15.0)
Total Lymphocyte: 28.4 %
WBC: 5.4 10*3/uL (ref 3.8–10.8)

## 2018-05-15 LAB — BASIC METABOLIC PANEL
BUN / CREAT RATIO: 22 (calc) (ref 6–22)
BUN: 21 mg/dL (ref 7–25)
CO2: 21 mmol/L (ref 20–32)
Calcium: 9.4 mg/dL (ref 8.6–10.4)
Chloride: 106 mmol/L (ref 98–110)
Creat: 0.94 mg/dL — ABNORMAL HIGH (ref 0.60–0.88)
Glucose, Bld: 236 mg/dL — ABNORMAL HIGH (ref 65–99)
Potassium: 4.6 mmol/L (ref 3.5–5.3)
Sodium: 137 mmol/L (ref 135–146)

## 2018-05-16 ENCOUNTER — Encounter: Payer: Self-pay | Admitting: Family Medicine

## 2018-05-16 LAB — URINE CULTURE
MICRO NUMBER:: 340988
SPECIMEN QUALITY:: ADEQUATE

## 2018-05-16 NOTE — Assessment & Plan Note (Signed)
Controlled no changes 

## 2018-05-16 NOTE — Assessment & Plan Note (Signed)
Again, increase lantus to 40 units, no change to novolog 6 units with each meal

## 2018-05-16 NOTE — Assessment & Plan Note (Signed)
Currently at baseline advised to take lasix daily Recheck NA and K level today

## 2018-05-20 ENCOUNTER — Encounter: Payer: Self-pay | Admitting: *Deleted

## 2018-06-07 ENCOUNTER — Telehealth: Payer: Self-pay | Admitting: Family Medicine

## 2018-06-07 MED ORDER — INSULIN GLARGINE 100 UNIT/ML SOLOSTAR PEN: 40.0000 [IU] | PEN_INJECTOR | Freq: Every day | SUBCUTANEOUS | 11 refills | Status: DC

## 2018-06-07 NOTE — Telephone Encounter (Signed)
Pt grand-daughter called who speaks english, her CBG have been going up past couple of days, this AM 250's now 377, taking lantus 40 units and novolog 6 units with each meal, though significant confusion verifying this. She actually called her Mother whom [name] lives with on 3 way call with me.   No current illness, feels well otherwise Has been eating more snacks  Give 3 more units of Novlog now  Give Lantus 40 units at bedtime  Per daughter most of her fastings 130-140  Will increase novolog to 8 units starting tomorrow Continue lantus 40 units Discussed diet  Will f/u via interpreter on Wed

## 2018-06-08 ENCOUNTER — Other Ambulatory Visit: Payer: Self-pay | Admitting: Family Medicine

## 2018-06-09 NOTE — Telephone Encounter (Signed)
Call placed to patient daughter [place] via [employer].   Advised that patient FSBS are as follows:   AM Noon PM  06/07/2018 200 350 377  06/08/2018 133 145 116  06/09/2018 112     Reports that MD increased insulin on Monday, 06/07/2018.  States that patient is now taking Novolog8u with meals and Lantus 40u at bedtime.   MD made aware and recommends to continue current doses of insulin. Contact office if FSBS >200.  Patient daughter Wilhemena Durie made aware.

## 2018-06-17 ENCOUNTER — Telehealth: Payer: Self-pay | Admitting: *Deleted

## 2018-06-17 NOTE — Telephone Encounter (Signed)
No changes, looks good

## 2018-06-17 NOTE — Telephone Encounter (Signed)
Received call from patient granddaughter.   Reports that patient FSBS readings are as follows:  AM Noon PM  4/13 200 350 377  4/14 133 145 116  4/15 112 232 155  4/16 161  159  4/17 169 111 244  4/18 91    4/19 112 168   4/20 125 156   4/21 91 155   4/22 125 111   4/23 106     Patient is currently taking Novolog 12u TID with meals and Lantus 48u at bedtime.   MD to be made aware.

## 2018-06-17 NOTE — Telephone Encounter (Signed)
Call placed to patient and patient daughter Wilhemena Durie made aware.

## 2018-06-21 ENCOUNTER — Other Ambulatory Visit: Payer: Self-pay

## 2018-06-22 ENCOUNTER — Ambulatory Visit: Payer: Medicaid Other | Admitting: Family Medicine

## 2018-06-22 ENCOUNTER — Encounter: Payer: Self-pay | Admitting: Family Medicine

## 2018-06-22 VITALS — BP 122/70 | HR 58 | Temp 98.4°F | Resp 14 | Ht <= 58 in | Wt 164.0 lb

## 2018-06-22 DIAGNOSIS — E1122 Type 2 diabetes mellitus with diabetic chronic kidney disease: Secondary | ICD-10-CM | POA: Diagnosis not present

## 2018-06-22 DIAGNOSIS — N182 Chronic kidney disease, stage 2 (mild): Secondary | ICD-10-CM

## 2018-06-22 DIAGNOSIS — N3000 Acute cystitis without hematuria: Secondary | ICD-10-CM | POA: Diagnosis not present

## 2018-06-22 DIAGNOSIS — Z794 Long term (current) use of insulin: Secondary | ICD-10-CM | POA: Diagnosis not present

## 2018-06-22 DIAGNOSIS — R300 Dysuria: Secondary | ICD-10-CM | POA: Diagnosis not present

## 2018-06-22 DIAGNOSIS — K219 Gastro-esophageal reflux disease without esophagitis: Secondary | ICD-10-CM | POA: Diagnosis not present

## 2018-06-22 LAB — BASIC METABOLIC PANEL
BUN: 23 mg/dL (ref 7–25)
CO2: 24 mmol/L (ref 20–32)
Calcium: 9.4 mg/dL (ref 8.6–10.4)
Chloride: 97 mmol/L — ABNORMAL LOW (ref 98–110)
Creat: 0.84 mg/dL (ref 0.60–0.88)
Glucose, Bld: 116 mg/dL — ABNORMAL HIGH (ref 65–99)
Potassium: 4.5 mmol/L (ref 3.5–5.3)
Sodium: 130 mmol/L — ABNORMAL LOW (ref 135–146)

## 2018-06-22 LAB — CBC WITH DIFFERENTIAL/PLATELET
Absolute Monocytes: 545 cells/uL (ref 200–950)
Basophils Absolute: 39 cells/uL (ref 0–200)
Basophils Relative: 0.7 %
Eosinophils Absolute: 149 cells/uL (ref 15–500)
Eosinophils Relative: 2.7 %
HCT: 36.1 % (ref 35.0–45.0)
Hemoglobin: 12 g/dL (ref 11.7–15.5)
Lymphs Abs: 1777 cells/uL (ref 850–3900)
MCH: 27.2 pg (ref 27.0–33.0)
MCHC: 33.2 g/dL (ref 32.0–36.0)
MCV: 81.9 fL (ref 80.0–100.0)
MPV: 11.1 fL (ref 7.5–12.5)
Monocytes Relative: 9.9 %
Neutro Abs: 2992 cells/uL (ref 1500–7800)
Neutrophils Relative %: 54.4 %
Platelets: 201 10*3/uL (ref 140–400)
RBC: 4.41 10*6/uL (ref 3.80–5.10)
RDW: 14.5 % (ref 11.0–15.0)
Total Lymphocyte: 32.3 %
WBC: 5.5 10*3/uL (ref 3.8–10.8)

## 2018-06-22 LAB — URINALYSIS, ROUTINE W REFLEX MICROSCOPIC
Bilirubin Urine: NEGATIVE
Glucose, UA: NEGATIVE
Hyaline Cast: NONE SEEN /LPF
Ketones, ur: NEGATIVE
Nitrite: NEGATIVE
Protein, ur: NEGATIVE
Specific Gravity, Urine: 1.015 (ref 1.001–1.03)
Squamous Epithelial / HPF: NONE SEEN /HPF (ref <=5)
pH: 6.5 (ref 5.0–8.0)

## 2018-06-22 LAB — GLUCOSE 16585: Glucose: 127 mg/dL — ABNORMAL HIGH (ref 65–99)

## 2018-06-22 LAB — MICROSCOPIC MESSAGE

## 2018-06-22 LAB — HEMOGLOBIN A1C, FINGERSTICK: Hgb A1C (fingerstick): 8.7 % OF TOTAL HGB — ABNORMAL HIGH (ref <6.0)

## 2018-06-22 MED ORDER — AMPICILLIN 500 MG PO CAPS: 500.0000 mg | ORAL_CAPSULE | Freq: Four times a day (QID) | ORAL | 0 refills | Status: DC

## 2018-06-22 MED ORDER — SUCRALFATE 1 G PO TABS: 1.0000 g | ORAL_TABLET | Freq: Two times a day (BID) | ORAL | 3 refills | Status: DC | PRN

## 2018-06-22 MED ORDER — TRAMADOL HCL 50 MG PO TABS: 50.0000 mg | ORAL_TABLET | Freq: Two times a day (BID) | ORAL | 1 refills | Status: DC | PRN

## 2018-06-22 NOTE — Assessment & Plan Note (Signed)
After further discussion, pt has been changing her insulin a lot per daughter She will take Lantus 40 units at bedtime Novolog 10 units with each meal Continue to check blood sugar 3 times a day

## 2018-06-22 NOTE — Assessment & Plan Note (Signed)
She only takes carafate once a day, denies any symptoms

## 2018-06-22 NOTE — Patient Instructions (Addendum)
F/U as previous  Take antibiotics Take ultram or tylenol for pain Lantus 40 units Novolog 10 units with each meal

## 2018-06-22 NOTE — Progress Notes (Signed)
Subjective:    Patient ID: Joyce Gilmore, female    DOB: Sep 19, 1934, 84 y.o.   MRN: [id number]  Patient presents for Dysuria (x5 days- burning with urination) and Discuss FSBS   Pt here with daughter and interpreter    Dysuria for the past 5 days,denied abd pain, no N/V, no fever, has felt more achy all over, more than her typical joint pain and spinal stenossis but Ultram has helped.   Denies any blood in urine, no change in bowel movements  Her last Urine culture after treatment with fosfamyin for Ecoli, ESBL  in the hospital showed Enteroccus Faecalis sensitive to Amp, vancomycin and macrobid  She did do Macrobid before her last admission and failed treatment   Diabetes mellitus she is currently on Lantus 40 units at bedtime and NovoLog 10 units with each meal.  Her blood sugars were running up significantly over the past couple weeks see all the phone notes in regards to this. She often change her insulin dose per her daughter. No hypoglycemia symptoms  Meter- 7 day average 147 , 30 day average 154   Fasting CBG 116-179 , after meals 150-211 My partner had actually told her to go to 48 units of Lantus and 12 units of NovoLog when she called in a few times while he was on call about 2 weeks ago but per above patient has not been consistent  Review Of Systems:  GEN- denies fatigue, fever, weight loss,weakness, recent illness HEENT- denies eye drainage, change in vision, nasal discharge, CVS- denies chest pain, palpitations RESP- denies SOB, cough, wheeze ABD- denies N/V, change in stools, abd pain GU- + dysuria, denies hematuria, dribbling, incontinence MSK- denies joint pain, muscle aches, injury Neuro- denies headache, dizziness, syncope, seizure activity       Objective:    BP 122/70   Pulse (!) 58   Temp 98.4 F (36.9 C) (Oral)   Resp 14   Ht 4\' 8"  (1.422 m)   Wt 164 lb (74.4 kg)   SpO2 95%   BMI 36.77 kg/m  GEN- NAD, alert and oriented x3 HEENT- PERRL, EOMI, non  injected sclera, pink conjunctiva, MMM, oropharynx clear Neck- Supple, no thyromegaly CVS- RRR, no murmur RESP-CTAB ABD-NABS,soft,mild TTP suprapubuic region, no CVA tenderness,ND EXT- No edema Pulses- Radial 2+   A1C intermin today 8.7%- unchanged      Assessment & Plan:      Problem List Items Addressed This Visit      Unprioritized   GERD (gastroesophageal reflux disease)    She only takes carafate once a day, denies any symptoms      Relevant Medications   sucralfate (CARAFATE) 1 g tablet   Type 2 diabetes mellitus with diabetic chronic kidney disease (HCC)    After further discussion, pt has been changing her insulin a lot per daughter She will take Lantus 40 units at bedtime Novolog 10 units with each meal Continue to check blood sugar 3 times a day        Relevant Orders   Basic metabolic panel   Hemoglobin A1C, fingerstick (Completed)   CBC with Differential/Platelet   Glucose, fingerstick (stat)    Other Visit Diagnoses    Acute cystitis without hematuria    -  Primary   Start ampicillin based on previous 2 cultures, will adjust as needed    Relevant Orders   Urinalysis, Routine w reflex microscopic (Completed)   Urine Culture   CBC with Differential/Platelet  Note: This dictation was prepared with Dragon dictation along with smaller phrase technology. Any transcriptional errors that result from this process are unintentional.

## 2018-06-24 LAB — URINE CULTURE
MICRO NUMBER:: 428064
SPECIMEN QUALITY:: ADEQUATE

## 2018-06-25 ENCOUNTER — Telehealth: Payer: Self-pay | Admitting: Family Medicine

## 2018-06-25 MED ORDER — FOSFOMYCIN TROMETHAMINE 3 G PO PACK: PACK | ORAL | 0 refills | Status: DC

## 2018-06-25 MED ORDER — NITROFURANTOIN MONOHYD MACRO 100 MG PO CAPS: 100.0000 mg | ORAL_CAPSULE | Freq: Two times a day (BID) | ORAL | 0 refills | Status: DC

## 2018-06-25 NOTE — Addendum Note (Signed)
Addended by: Launa Grill on: 06/25/2018 02:58 PM   Modules accepted: Orders

## 2018-06-25 NOTE — Telephone Encounter (Signed)
Medication has been sent to [facility] [address].

## 2018-06-25 NOTE — Telephone Encounter (Signed)
Called patient's pharmacy and medication was out of stock. I have called interpreter and had them leave a message on the home phone and on the cellphone as patient did not answer. Awaiting return call. Will forward this to christina as well in case I do not hear from the patient today.

## 2018-06-25 NOTE — Telephone Encounter (Signed)
Pt with ESBL E coli bacteriuria and she was symptomatic.  She is currently on ampicillin which is resistant to.  We will put her on nitrofurantoin again 100 mg twice a day for 7 days we will also give her fosfomycin 3 g 1 p.o. repeat in 48 hours    Please call Pharmacy and ensure they have both the macrobid and the fosfamycin  Then call patient- use interpreter line to get to her daughter Joyce Gilmore

## 2018-06-28 NOTE — Telephone Encounter (Signed)
Call placed to patient. No answer. No VM.  

## 2018-06-29 NOTE — Telephone Encounter (Signed)
Call placed to patient daughter Wilhemena Durie via interpreter services. No answer. [place].

## 2018-06-30 NOTE — Telephone Encounter (Signed)
Call placed to patient daughter Wilhemena Durie via interpreter services. No answer. [place].

## 2018-07-01 NOTE — Telephone Encounter (Signed)
Call placed to patient and patient daughter Wilhemena Durie made aware via interpreter services.

## 2018-07-12 ENCOUNTER — Other Ambulatory Visit: Payer: Self-pay | Admitting: Family Medicine

## 2018-07-20 ENCOUNTER — Other Ambulatory Visit: Payer: Self-pay | Admitting: Family Medicine

## 2018-07-22 ENCOUNTER — Other Ambulatory Visit: Payer: Self-pay | Admitting: Family Medicine

## 2018-07-23 ENCOUNTER — Other Ambulatory Visit: Payer: Self-pay | Admitting: Family Medicine

## 2018-07-28 ENCOUNTER — Other Ambulatory Visit: Payer: Self-pay | Admitting: Family Medicine

## 2018-07-28 NOTE — Telephone Encounter (Signed)
Requested Prescriptions   Pending Prescriptions Disp Refills  . benzonatate (TESSALON) 100 MG capsule [Pharmacy Med Name: BENZONATATE 100MG  CAPSULES] 20 capsule 0    Sig: TAKE ONE CAPSULE BY MOUTH THREE TIMES DAILY AS NEEDED FOR COUGH   Signed Prescriptions Disp Refills  . furosemide (LASIX) 20 MG tablet 90 tablet 3    Sig: TAKE 1 TABLET BY MOUTH DAILY    Authorizing Provider: Alycia Rossetti    Ordering User: Joselito Fieldhouse C  . lisinopril (ZESTRIL) 10 MG tablet 90 tablet 3    Sig: TAKE 1 TABLET BY MOUTH DAILY    Authorizing Provider: Alycia Rossetti    Ordering User: Vanice Sarah

## 2018-08-02 ENCOUNTER — Other Ambulatory Visit: Payer: Self-pay | Admitting: Family Medicine

## 2018-09-15 ENCOUNTER — Other Ambulatory Visit: Payer: Self-pay | Admitting: Family Medicine

## 2018-09-15 DIAGNOSIS — Z1231 Encounter for screening mammogram for malignant neoplasm of breast: Secondary | ICD-10-CM

## 2018-09-22 ENCOUNTER — Encounter: Payer: Medicaid Other | Admitting: Family Medicine

## 2018-10-05 ENCOUNTER — Other Ambulatory Visit: Payer: Self-pay | Admitting: Family Medicine

## 2018-10-06 NOTE — Telephone Encounter (Signed)
Ok to refill 

## 2018-10-20 ENCOUNTER — Other Ambulatory Visit: Payer: Self-pay | Admitting: Family Medicine

## 2018-10-21 ENCOUNTER — Telehealth: Payer: Self-pay | Admitting: Family Medicine

## 2018-10-21 NOTE — Telephone Encounter (Signed)
Call placed to patient granddaughter York Cerise.   States that patient CBG this morning was 177 and 260 at lunch. Reports that she is taking Novolog sliding scale.   Advised to increase water intake. Advised to continue sliding scale as directed. Advised to call back with the last weeks worth of readings.

## 2018-10-21 NOTE — Telephone Encounter (Signed)
Patients sugar levels elevated would like a call back as to what she she do  Joyce Gilmore [phone]

## 2018-10-29 ENCOUNTER — Other Ambulatory Visit: Payer: Self-pay

## 2018-10-29 ENCOUNTER — Ambulatory Visit
Admission: RE | Admit: 2018-10-29 | Discharge: 2018-10-29 | Disposition: A | Discharge status: Home / Self Care | Payer: Medicaid Other | Source: Ambulatory Visit | Attending: Family Medicine | Admitting: Family Medicine

## 2018-10-29 DIAGNOSIS — Z1231 Encounter for screening mammogram for malignant neoplasm of breast: Secondary | ICD-10-CM

## 2018-11-02 ENCOUNTER — Other Ambulatory Visit: Payer: Self-pay | Admitting: *Deleted

## 2018-11-02 DIAGNOSIS — Z20822 Contact with and (suspected) exposure to covid-19: Secondary | ICD-10-CM

## 2018-11-02 DIAGNOSIS — R6889 Other general symptoms and signs: Secondary | ICD-10-CM | POA: Diagnosis not present

## 2018-11-04 LAB — NOVEL CORONAVIRUS, NAA: SARS-CoV-2, NAA: DETECTED — AB

## 2018-11-16 ENCOUNTER — Encounter (HOSPITAL_COMMUNITY): Payer: Self-pay | Admitting: Emergency Medicine

## 2018-11-16 ENCOUNTER — Emergency Department (HOSPITAL_COMMUNITY)
Admission: EM | Admit: 2018-11-16 | Discharge: 2018-11-16 | Disposition: A | Discharge status: Home / Self Care | Payer: Medicaid Other | Attending: Emergency Medicine | Admitting: Emergency Medicine

## 2018-11-16 ENCOUNTER — Telehealth: Payer: Self-pay

## 2018-11-16 DIAGNOSIS — Z79899 Other long term (current) drug therapy: Secondary | ICD-10-CM | POA: Diagnosis not present

## 2018-11-16 DIAGNOSIS — R58 Hemorrhage, not elsewhere classified: Secondary | ICD-10-CM | POA: Diagnosis not present

## 2018-11-16 DIAGNOSIS — E1122 Type 2 diabetes mellitus with diabetic chronic kidney disease: Secondary | ICD-10-CM | POA: Insufficient documentation

## 2018-11-16 DIAGNOSIS — Z794 Long term (current) use of insulin: Secondary | ICD-10-CM | POA: Diagnosis not present

## 2018-11-16 DIAGNOSIS — I5032 Chronic diastolic (congestive) heart failure: Secondary | ICD-10-CM | POA: Insufficient documentation

## 2018-11-16 DIAGNOSIS — N189 Chronic kidney disease, unspecified: Secondary | ICD-10-CM | POA: Insufficient documentation

## 2018-11-16 DIAGNOSIS — K1379 Other lesions of oral mucosa: Secondary | ICD-10-CM | POA: Insufficient documentation

## 2018-11-16 DIAGNOSIS — I130 Hypertensive heart and chronic kidney disease with heart failure and stage 1 through stage 4 chronic kidney disease, or unspecified chronic kidney disease: Secondary | ICD-10-CM | POA: Insufficient documentation

## 2018-11-16 DIAGNOSIS — K068 Other specified disorders of gingiva and edentulous alveolar ridge: Secondary | ICD-10-CM | POA: Diagnosis not present

## 2018-11-16 DIAGNOSIS — Z7982 Long term (current) use of aspirin: Secondary | ICD-10-CM | POA: Diagnosis not present

## 2018-11-16 LAB — I-STAT CHEM 8, ED
BUN: 18 mg/dL (ref 8–23)
Calcium, Ion: 1.12 mmol/L — ABNORMAL LOW (ref 1.15–1.40)
Chloride: 99 mmol/L (ref 98–111)
Creatinine, Ser: 0.9 mg/dL (ref 0.44–1.00)
Glucose, Bld: 95 mg/dL (ref 70–99)
HCT: 38 % (ref 36.0–46.0)
Hemoglobin: 12.9 g/dL (ref 12.0–15.0)
Potassium: 4.3 mmol/L (ref 3.5–5.1)
Sodium: 133 mmol/L — ABNORMAL LOW (ref 135–145)
TCO2: 24 mmol/L (ref 22–32)

## 2018-11-16 NOTE — ED Triage Notes (Signed)
Pt here with family with c/o a mouth bleed that she started last night around 10 pm , no bleeding noted now , pt was covid positive 2 weeks ago

## 2018-11-16 NOTE — Discharge Instructions (Addendum)
As discussed, your evaluation today has been largely reassuring.  But, it is important that you monitor your condition carefully, and do not hesitate to return to the ED if you develop new, or concerning changes in your condition. ? ?Otherwise, please follow-up with your physician for appropriate ongoing care. ? ?

## 2018-11-16 NOTE — Telephone Encounter (Signed)
Pt's daughter called to report that pt is bleeding from the mouth with no explanation. Daughter states that the blood filled up a papertowel. I advised daughter to take pt to ED to be examined. Pt did test positive 2 weeks ago for covid.

## 2018-11-16 NOTE — ED Provider Notes (Signed)
[facility] EMERGENCY DEPARTMENT Provider Note   CSN: [id number] Arrival date & time: 11/16/18  1051     History   Chief Complaint No chief complaint on file.   HPI Joyce Gilmore is a 84 y.o. female.     HPI Patient presents after episode of bleeding in her mouth. Currently patient has no complaints, no pain, no lightheadedness, no nausea, no cough. Without clear precipitant patient was resting, when she noticed bleeding in her mouth last night about 12 hours prior to ED arrival. Bleeding stopped, and she has had none over the rest of the night, nor today. After speaking with her physician, but being unable to have a visit in the office she was sent here for evaluation. Patient does have a notable history of coronavirus infection 2 weeks ago, but notes that she has been recovering well. Patient is here with her daughter who assists with the HPI. Past Medical History:  Diagnosis Date  . Arthritis   . CHF (congestive heart failure) (HCC)    diastolic  . Chronic kidney disease    hx of kidney stones,   . Depression    hx of   . Diabetes mellitus without complication ([place])   . Ganglion, left ankle and foot   . Hearing loss of both ears   . Hypertension   . MRSA infection    Oct 13 - Nov 13  . Osteoporosis   . Tinnitus of both ears     Patient Active Problem List   Diagnosis Date Noted  . Chronic kidney disease 05/07/2018  . Lactic acidosis 05/07/2018  . GERD (gastroesophageal reflux disease) 03/10/2017  . Uses hearing aid 09/12/2016  . Asthmatic bronchitis 03/11/2016  . Chronic diastolic heart failure ([place]) 10/12/2015  . SOB (shortness of breath) 10/12/2015  . Shortness of breath 10/12/2015  . Seasonal allergies 11/21/2014  . DDD (degenerative disc disease), lumbar 10/11/2014  . H/O compression fracture of spine 10/11/2014  . Pulmonary hypertension ([place])   . Hepatic cirrhosis ([place]) 09/24/2014  . SIADH (syndrome of inappropriate ADH  production) ([place]) 06/13/2014  . Insomnia 12/28/2013  . Anemia 07/06/2013  . HLD (hyperlipidemia) 06/27/2013  . Osteoporosis 07/26/2012  . Hyponatremia 05/16/2011  . Type 2 diabetes mellitus with diabetic chronic kidney disease ([place]) 08/17/2006  . Essential hypertension 08/17/2006  . DIVERTICULOSIS, COLON 08/17/2006    Past Surgical History:  Procedure Laterality Date  . BACK SURGERY     history restored after error with record merge      . INCISION AND DRAINAGE HIP  12/22/2011   Procedure: IRRIGATION AND DEBRIDEMENT HIP;  Surgeon: Mcarthur Rossetti, MD;  Location: [facility];  Service: Orthopedics;  Laterality: Right;  Irrigation and debridement right hip  . INCISION AND DRAINAGE HIP  12/27/2011   Procedure: IRRIGATION AND DEBRIDEMENT HIP;  Surgeon: Mcarthur Rossetti, MD;  Location: [facility];  Service: Orthopedics;  Laterality: Right;  Repeat irrigation and debridement  . JOINT REPLACEMENT     bilateral knee, right hip   . TOTAL HIP REVISION  12/05/2011   Procedure: TOTAL HIP REVISION;  Surgeon: Mcarthur Rossetti, MD;  Location: [facility];  Service: Orthopedics;  Laterality: Right;  Right Hip Revision Arthroplasty to Total Hip, Excision of Old Implant     OB History   No obstetric history on file.      Home Medications    Prior to Admission medications   Medication Sig Start Date End Date Taking? Authorizing Provider  ACCU-CHEK  AVIVA PLUS test strip USE TO CHECK BLOOD SUGAR FOUR TIMES DAILY AS DIRECTED 12/21/17   Alycia Rossetti, MD  ACCU-CHEK AVIVA PLUS test strip USE TO CHECK BLOOD SUGAR FOUR TIMES DAILY AS DIRECTED 10/20/18   Alycia Rossetti, MD  Accu-Chek Softclix Lancets lancets USE AS DIRECTED 07/23/18   [place], Modena Nunnery, MD  Acetaminophen (TYLENOL PO) Take 1,000 tablets by mouth every 6 (six) hours as needed (pain/headache).     [provider]  albuterol (PROVENTIL HFA;VENTOLIN HFA) 108 (90 Base) MCG/ACT inhaler Inhale 1-2 puffs into the lungs every 4  (four) hours as needed for wheezing or shortness of breath. 01/27/18   Alycia Rossetti, MD  albuterol (PROVENTIL) (2.5 MG/3ML) 0.083% nebulizer solution Take 3 mLs (2.5 mg total) by nebulization every 6 (six) hours as needed for wheezing or shortness of breath. 04/23/18   Alycia Rossetti, MD  amLODipine (NORVASC) 5 MG tablet Take 1 tablet (5 mg total) by mouth daily. 07/20/18   [place], Modena Nunnery, MD  ampicillin (PRINCIPEN) 500 MG capsule Take 1 capsule (500 mg total) by mouth 4 (four) times daily. 06/22/18   Alycia Rossetti, MD  aspirin EC 81 MG tablet Take 81 mg by mouth daily.    [provider]  benzonatate (TESSALON) 100 MG capsule TAKE 1 CAPSULE BY MOUTH THREE TIMES DAILY AS NEEDED FOR COUGH 10/06/18   [place], Modena Nunnery, MD  blood glucose meter kit and supplies KIT Dispense based on patient and insurance preference. Use up to four times daily as directed. (FOR ICD-9 250.00, 250.01). 04/25/14   Dena Billet B, PA-C  calcium carbonate (OSCAL) 1500 (600 Ca) MG TABS tablet Take 600 mg of elemental calcium by mouth 2 (two) times daily with a meal.    [provider]  fluticasone (FLONASE) 50 MCG/ACT nasal spray Place 2 sprays into both nostrils daily. 04/23/18   [place], Modena Nunnery, MD  Fluticasone Furoate (ARNUITY ELLIPTA) 100 MCG/ACT AEPB Inhale 1 puff into the lungs daily. 01/29/18   [place], Modena Nunnery, MD  fosfomycin (MONUROL) 3 g PACK TAKE 1 PACKET TODAY, THEN REPEAT IN 2 DAYS FOR URINE INFECTION 07/20/18   Alycia Rossetti, MD  furosemide (LASIX) 20 MG tablet TAKE 1 TABLET BY MOUTH DAILY 07/28/18   [place], Modena Nunnery, MD  insulin aspart (NOVOLOG FLEXPEN) 100 UNIT/ML FlexPen FOLLOW SLIDING SCALE AS DIRECTED~ inject 5-15 U as directed. Patient taking differently: Inject 5-15 Units into the skin 3 (three) times daily with meals. Per sliding scale 01/27/18   [place], Modena Nunnery, MD  Insulin Glargine (LANTUS SOLOSTAR) 100 UNIT/ML Solostar Pen Inject 40 Units into the skin at bedtime. 06/07/18    [place], Modena Nunnery, MD  Insulin Pen Needle (B-D ULTRAFINE III SHORT PEN) 31G X 8 MM MISC USE AS DIRECTED 07/12/18   [place], Modena Nunnery, MD  lansoprazole (PREVACID) 30 MG capsule TAKE 1 CAPSULE BY MOUTH TWICE DAILY BEFORE A MEAL Patient taking differently: Take 30 mg by mouth 2 (two) times daily before a meal.  01/27/18   [place], Modena Nunnery, MD  lisinopril (ZESTRIL) 10 MG tablet TAKE 1 TABLET BY MOUTH DAILY 07/28/18   Alycia Rossetti, MD  metoprolol tartrate (LOPRESSOR) 25 MG tablet Take 0.5 tablets (12.5 mg total) by mouth 2 (two) times daily. 01/27/18   [place], Modena Nunnery, MD  nitrofurantoin, macrocrystal-monohydrate, (MACROBID) 100 MG capsule Take 1 capsule (100 mg total) by mouth 2 (two) times daily. 06/25/18   Alycia Rossetti, MD  NOVOLOG [place] Burkitt  100 UNIT/ML FlexPen FOLLOW SLIDING SCALE AS DIRECTED Patient taking differently: Inject into the skin 3 (three) times daily with meals. Per sliding scale 12/21/17   [place], Modena Nunnery, MD  polyvinyl alcohol (ARTIFICIAL TEARS) 1.4 % ophthalmic solution Place 1 drop into both eyes as needed for dry eyes. 10/12/17   Alycia Rossetti, MD  pravastatin (PRAVACHOL) 20 MG tablet Take 1 tablet (20 mg total) by mouth daily. 01/27/18   [place], Modena Nunnery, MD  sucralfate (CARAFATE) 1 g tablet TAKE 1 TABLET BY MOUTH FOUR TIMES DAILY 08/02/18   Alycia Rossetti, MD  traMADol (ULTRAM) 50 MG tablet Take 1 tablet (50 mg total) by mouth 2 (two) times daily as needed. Chronic pain 06/22/18   Alycia Rossetti, MD    Family History Family History  Problem Relation Age of Onset  . Hypertension Mother   . Colon cancer Neg Hx   . Breast cancer Neg Hx     Social History Social History   Tobacco Use  . Smoking status: Never Smoker  . Smokeless tobacco: Never Used  Substance Use Topics  . Alcohol use: No    Alcohol/week: 0.0 standard drinks  . Drug use: No     Allergies   Bactrim [sulfamethoxazole-trimethoprim], Rifampin, and Vancomycin   Review of Systems Review of  Systems  Constitutional:       Per HPI, otherwise negative  HENT:       Per HPI, otherwise negative  Respiratory:       Per HPI, otherwise negative  Cardiovascular:       Per HPI, otherwise negative  Gastrointestinal: Negative for vomiting.  Endocrine:       Negative aside from HPI  Genitourinary:       Neg aside from HPI   Musculoskeletal:       Per HPI, otherwise negative  Skin: Negative.   Neurological: Negative for syncope.  Hematological: Negative.      Physical Exam Updated Vital Signs BP 131/83   Pulse 68   Temp 98.2 F (36.8 C) (Oral)   Resp 16   SpO2 94%   Physical Exam Vitals signs and nursing note reviewed.  Constitutional:      General: She is not in acute distress.    Appearance: She is well-developed.  HENT:     Head: Normocephalic and atraumatic.     Mouth/Throat:   Eyes:     Conjunctiva/sclera: Conjunctivae normal.  Cardiovascular:     Rate and Rhythm: Normal rate and regular rhythm.  Pulmonary:     Effort: Pulmonary effort is normal. No respiratory distress.     Breath sounds: Normal breath sounds. No stridor.  Abdominal:     General: There is no distension.  Skin:    General: Skin is warm and dry.  Neurological:     Mental Status: She is alert and oriented to person, place, and time.     Cranial Nerves: No cranial nerve deficit.      ED Treatments / Results  Labs (all labs ordered are listed, but only abnormal results are displayed) Labs Reviewed  I-STAT CHEM 8, ED - Abnormal; Notable for the following components:      Result Value   Sodium 133 (*)    Calcium, Ion 1.12 (*)    All other components within normal limits    EKG None  Radiology No results found.  Procedures Procedures (including critical care time)  Medications Ordered in ED Medications - No data to display  Initial Impression / Assessment and Plan / ED Course  I have reviewed the triage vital signs and the nursing notes.  Pertinent labs & imaging  results that were available during my care of the patient were reviewed by me and considered in my medical decision making (see chart for details).  On repeat exam patient is in no distress. I discussed unremarkable hemoglobin value with patient and her daughter. Vital signs remain unremarkable. This elderly female presents after an episode of bleeding that occurred yesterday. Patient is awake, alert, hemodynamically unremarkable.  On exam she is found to have a slightly larger than punctate lesion on her hard palate, which may have been the source of her bleeding. No respiratory difficulty, no evidence for hemoptysis, hematemesis. No hemodynamic instability. Patient discharged in stable condition with outpatient follow-up.  Final Clinical Impressions(s) / ED Diagnoses   Final diagnoses:  Bleeding    ED Discharge Orders    None       Carmin Muskrat, MD 11/16/18 1512

## 2018-11-22 ENCOUNTER — Ambulatory Visit (INDEPENDENT_AMBULATORY_CARE_PROVIDER_SITE_OTHER): Payer: Medicaid Other | Admitting: Orthopaedic Surgery

## 2018-11-22 ENCOUNTER — Encounter: Payer: Self-pay | Admitting: Orthopaedic Surgery

## 2018-11-22 ENCOUNTER — Ambulatory Visit: Payer: Medicaid Other

## 2018-11-22 DIAGNOSIS — M5442 Lumbago with sciatica, left side: Secondary | ICD-10-CM

## 2018-11-22 DIAGNOSIS — G8929 Other chronic pain: Secondary | ICD-10-CM | POA: Diagnosis not present

## 2018-11-22 DIAGNOSIS — M25552 Pain in left hip: Secondary | ICD-10-CM

## 2018-11-22 MED ORDER — GABAPENTIN 100 MG PO CAPS: 100.0000 mg | ORAL_CAPSULE | Freq: Three times a day (TID) | ORAL | 2 refills | Status: DC

## 2018-11-22 MED ORDER — HYDROCODONE-ACETAMINOPHEN 5-325 MG PO TABS: 1.0000 | ORAL_TABLET | Freq: Four times a day (QID) | ORAL | 0 refills | Status: DC | PRN

## 2018-11-22 NOTE — Progress Notes (Signed)
Office Visit Note   Patient: Joyce Gilmore           Date of Birth: [birth year]           MRN: [id number] Visit Date: 11/22/2018              Requested by: Alycia Rossetti, MD [address] [place],  [place] [address] PCP: Alycia Rossetti, MD   Assessment & Plan: Visit Diagnoses:  1. Pain in left hip   2. Chronic bilateral low back pain with left-sided sciatica     Plan: I do feel that she would benefit from 100 mg of Neurontin up to 3 times a day combined with short course of hydrocodone.  Also it is worth having Dr. Ernestina Patches consider another steroid injection in her lumbar spine at the left L4-L5 level where her stenosis is the worst.  Since she did get some relief albeit for only 2 weeks at her last injection is worth at least trying this again because her symptoms are bad enough and her back degenerative disease is slowly worsening.  All question concerns were answered and addressed.  I will see her back myself in about 4 weeks.  Follow-Up Instructions: Return in about 4 weeks (around 12/20/2018).   Orders:  Orders Placed This Encounter  Procedures  . XR HIP UNILAT W OR W/O PELVIS 1V LEFT  . XR Lumbar Spine 2-3 Views   Meds ordered this encounter  Medications  . gabapentin (NEURONTIN) 100 MG capsule    Sig: Take 1 capsule (100 mg total) by mouth 3 (three) times daily.    Dispense:  60 capsule    Refill:  2  . HYDROcodone-acetaminophen (NORCO/VICODIN) 5-325 MG tablet    Sig: Take 1 tablet by mouth every 6 (six) hours as needed for moderate pain.    Dispense:  30 tablet    Refill:  0      Procedures: No procedures performed   Clinical Data: No additional findings.   Subjective: Chief Complaint  Patient presents with  . Left Hip - Pain  The patient is well-known to [place].  She is an active 84 year old female with is been developing worsening left-sided low back pain and sciatica that is gotten to be a worse constant aching pain is worse with standing and  walking.  She had an MRI and plain films last year showing the degree of her degenerative scoliosis.  She did have a left-sided L4-L5 injection in November of last year by Dr. Ernestina Patches.  That helped for only about 2 weeks.  She has seen a chiropractor some that has done some adjustments.  She is denying groin pain and she points to sciatic region and down her backside a source of her pain.  It does radiate to her knee and to her foot at times.  She is diabetic but does not report terrible control.  She is needing some type of medication to help with her pain.  She is only taken before this some tramadol on occasion.  We are seeing her with an interpreter's assistance today as well.  HPI  Review of Systems There currently no chest pain, shortness of breath, fever, chills, nausea, vomiting  Objective: Vital Signs: There were no vitals taken for this visit.  Physical Exam She is alert and oriented in no acute distress but obvious discomfort.  She is ambulating using an assistive device. Ortho Exam On examination I can put her left hip easily through  internal and external rotation with no pain in the groin at all.  She does have a positive straight leg raise noted to the left side and significant pain discomfort in the sciatic region in the low back to the left side in the paraspinal muscles and into the sciatic region.  This is radiating down her leg.  There is some subjective numbness in her left side more so than the right side Specialty Comments:  No specialty comments available.  Imaging: Xr Hip Unilat W Or W/o Pelvis 1v Left  Result Date: 11/22/2018 An AP pelvis and lateral of the left hip shows no acute findings with the left hip joint.  There is a right total hip arthroplasty.  The left hip joint space is well-maintained.  Xr Lumbar Spine 2-3 Views  Result Date: 11/22/2018 2 views of the lumbar spine show worsening degenerative scoliosis with spondylosis and degenerative disc disease at  multiple levels.    PMFS History: Patient Active Problem List   Diagnosis Date Noted  . Chronic kidney disease 05/07/2018  . Lactic acidosis 05/07/2018  . GERD (gastroesophageal reflux disease) 03/10/2017  . Uses hearing aid 09/12/2016  . Asthmatic bronchitis 03/11/2016  . Chronic diastolic heart failure ([facility]) 10/12/2015  . SOB (shortness of breath) 10/12/2015  . Shortness of breath 10/12/2015  . Seasonal allergies 11/21/2014  . DDD (degenerative disc disease), lumbar 10/11/2014  . H/O compression fracture of spine 10/11/2014  . Pulmonary hypertension ([facility])   . Hepatic cirrhosis ([facility]) 09/24/2014  . SIADH (syndrome of inappropriate ADH production) ([facility]) 06/13/2014  . Insomnia 12/28/2013  . Anemia 07/06/2013  . HLD (hyperlipidemia) 06/27/2013  . Osteoporosis 07/26/2012  . Hyponatremia 05/16/2011  . Type 2 diabetes mellitus with diabetic chronic kidney disease ([facility]) 08/17/2006  . Essential hypertension 08/17/2006  . DIVERTICULOSIS, COLON 08/17/2006   Past Medical History:  Diagnosis Date  . Arthritis   . CHF (congestive heart failure) (H[facility])    diastolic  . Chronic kidney disease    hx of kidney stones,   . Depression    hx of   . Diabetes mellitus without complication ([facility])   . Ganglion, left ankle and foot   . Hearing loss of both ears   . Hypertension   . MRSA infection    Oct 13 - Nov 13  . Osteoporosis   . Tinnitus of both ears     Family History  Problem Relation Age of Onset  . Hypertension Mother   . Colon cancer Neg Hx   . Breast cancer Neg Hx     Past Surgical History:  Procedure Laterality Date  . BACK SURGERY     history restored after error with record merge      . INCISION AND DRAINAGE HIP  12/22/2011   Procedure: IRRIGATION AND DEBRIDEMENT HIP;  Surgeon: Mcarthur Rossetti, MD;  Location: [facility];  Service: Orthopedics;  Laterality: Right;  Irrigation and debridement right hip  . INCISION AND DRAINAGE HIP  12/27/2011   Procedure: IRRIGATION AND  DEBRIDEMENT HIP;  Surgeon: Mcarthur Rossetti, MD;  Location: [facility];  Service: Orthopedics;  Laterality: Right;  Repeat irrigation and debridement  . JOINT REPLACEMENT     bilateral knee, right hip   . TOTAL HIP REVISION  12/05/2011   Procedure: TOTAL HIP REVISION;  Surgeon: Mcarthur Rossetti, MD;  Location: [facility];  Service: Orthopedics;  Laterality: Right;  Right Hip Revision Arthroplasty to Total Hip, Excision of Old Implant   Social History  Occupational History  . Not on file  Tobacco Use  . Smoking status: Never Smoker  . Smokeless tobacco: Never Used  Substance and Sexual Activity  . Alcohol use: No    Alcohol/week: 0.0 standard drinks  . Drug use: No  . Sexual activity: Not Currently

## 2018-11-23 ENCOUNTER — Other Ambulatory Visit: Payer: Self-pay

## 2018-11-23 DIAGNOSIS — M545 Low back pain, unspecified: Secondary | ICD-10-CM

## 2018-11-23 DIAGNOSIS — G8929 Other chronic pain: Secondary | ICD-10-CM

## 2018-11-29 ENCOUNTER — Ambulatory Visit: Payer: Self-pay

## 2018-11-29 ENCOUNTER — Encounter: Payer: Self-pay | Admitting: Physical Medicine and Rehabilitation

## 2018-11-29 ENCOUNTER — Ambulatory Visit (INDEPENDENT_AMBULATORY_CARE_PROVIDER_SITE_OTHER): Payer: Medicaid Other | Admitting: Physical Medicine and Rehabilitation

## 2018-11-29 VITALS — BP 153/68 | HR 78

## 2018-11-29 DIAGNOSIS — M5416 Radiculopathy, lumbar region: Secondary | ICD-10-CM | POA: Diagnosis not present

## 2018-11-29 MED ORDER — BETAMETHASONE SOD PHOS & ACET 6 (3-3) MG/ML IJ SUSP
12.0000 mg | Freq: Once | INTRAMUSCULAR | Status: AC
  Administered 2018-11-29: 10:00:00 12 mg

## 2018-11-29 NOTE — Progress Notes (Signed)
 .  Numeric Pain Rating Scale and Functional Assessment Average Pain 8   In the last MONTH (on 0-10 scale) has pain interfered with the following?  1. General activity like being  able to carry out your everyday physical activities such as walking, climbing stairs, carrying groceries, or moving a chair?  Rating(8)   +Driver, -BT, -Dye Allergies.  

## 2018-12-10 ENCOUNTER — Encounter: Payer: Self-pay | Admitting: Family Medicine

## 2018-12-10 ENCOUNTER — Other Ambulatory Visit: Payer: Self-pay | Admitting: Family Medicine

## 2018-12-10 ENCOUNTER — Ambulatory Visit (INDEPENDENT_AMBULATORY_CARE_PROVIDER_SITE_OTHER): Payer: Medicaid Other | Admitting: Family Medicine

## 2018-12-10 ENCOUNTER — Other Ambulatory Visit: Payer: Self-pay

## 2018-12-10 VITALS — BP 142/86 | HR 62 | Temp 98.5°F | Resp 16 | Ht <= 58 in | Wt 156.0 lb

## 2018-12-10 DIAGNOSIS — Z23 Encounter for immunization: Secondary | ICD-10-CM | POA: Diagnosis not present

## 2018-12-10 DIAGNOSIS — I272 Pulmonary hypertension, unspecified: Secondary | ICD-10-CM

## 2018-12-10 DIAGNOSIS — N1831 Chronic kidney disease, stage 3a: Secondary | ICD-10-CM

## 2018-12-10 DIAGNOSIS — E1122 Type 2 diabetes mellitus with diabetic chronic kidney disease: Secondary | ICD-10-CM

## 2018-12-10 DIAGNOSIS — N182 Chronic kidney disease, stage 2 (mild): Secondary | ICD-10-CM | POA: Diagnosis not present

## 2018-12-10 DIAGNOSIS — Z794 Long term (current) use of insulin: Secondary | ICD-10-CM

## 2018-12-10 DIAGNOSIS — M810 Age-related osteoporosis without current pathological fracture: Secondary | ICD-10-CM

## 2018-12-10 DIAGNOSIS — E7849 Other hyperlipidemia: Secondary | ICD-10-CM

## 2018-12-10 DIAGNOSIS — Z0000 Encounter for general adult medical examination without abnormal findings: Secondary | ICD-10-CM

## 2018-12-10 DIAGNOSIS — Z974 Presence of external hearing-aid: Secondary | ICD-10-CM

## 2018-12-10 DIAGNOSIS — K7469 Other cirrhosis of liver: Secondary | ICD-10-CM

## 2018-12-10 DIAGNOSIS — I5032 Chronic diastolic (congestive) heart failure: Secondary | ICD-10-CM

## 2018-12-10 DIAGNOSIS — M5136 Other intervertebral disc degeneration, lumbar region: Secondary | ICD-10-CM

## 2018-12-10 DIAGNOSIS — I10 Essential (primary) hypertension: Secondary | ICD-10-CM | POA: Diagnosis not present

## 2018-12-10 DIAGNOSIS — M51369 Other intervertebral disc degeneration, lumbar region without mention of lumbar back pain or lower extremity pain: Secondary | ICD-10-CM

## 2018-12-10 MED ORDER — SUCRALFATE 1 G PO TABS: 1.0000 g | ORAL_TABLET | Freq: Four times a day (QID) | ORAL | 1 refills | Status: DC

## 2018-12-10 MED ORDER — PRAVASTATIN SODIUM 20 MG PO TABS: 20.0000 mg | ORAL_TABLET | Freq: Every day | ORAL | 3 refills | Status: DC

## 2018-12-10 MED ORDER — FUROSEMIDE 20 MG PO TABS: 20.0000 mg | ORAL_TABLET | Freq: Every day | ORAL | 3 refills | Status: DC

## 2018-12-10 MED ORDER — METOPROLOL TARTRATE 25 MG PO TABS: 12.5000 mg | ORAL_TABLET | Freq: Two times a day (BID) | ORAL | 3 refills | Status: DC

## 2018-12-10 MED ORDER — ACCU-CHEK AVIVA PLUS VI STRP: ORAL_STRIP | 3 refills | Status: DC

## 2018-12-10 MED ORDER — NOVOLOG FLEXPEN 100 UNIT/ML ~~LOC~~ SOPN: 5.0000 [IU] | PEN_INJECTOR | Freq: Three times a day (TID) | SUBCUTANEOUS | 1 refills | Status: DC

## 2018-12-10 MED ORDER — AMLODIPINE BESYLATE 5 MG PO TABS: 5.0000 mg | ORAL_TABLET | Freq: Every day | ORAL | 1 refills | Status: DC

## 2018-12-10 MED ORDER — FLUTICASONE PROPIONATE 50 MCG/ACT NA SUSP: 2.0000 | Freq: Every day | NASAL | 1 refills | Status: DC

## 2018-12-10 MED ORDER — ALBUTEROL SULFATE HFA 108 (90 BASE) MCG/ACT IN AERS: 1.0000 | INHALATION_SPRAY | RESPIRATORY_TRACT | 3 refills | Status: DC | PRN

## 2018-12-10 MED ORDER — LANSOPRAZOLE 30 MG PO CPDR: DELAYED_RELEASE_CAPSULE | ORAL | 3 refills | Status: DC

## 2018-12-10 MED ORDER — BD PEN NEEDLE SHORT U/F 31G X 8 MM MISC: 11 refills | Status: DC

## 2018-12-10 MED ORDER — ARNUITY ELLIPTA 100 MCG/ACT IN AEPB: 1.0000 | INHALATION_SPRAY | Freq: Every day | RESPIRATORY_TRACT | 3 refills | Status: DC

## 2018-12-10 MED ORDER — BENZONATATE 100 MG PO CAPS: ORAL_CAPSULE | ORAL | 0 refills | Status: DC

## 2018-12-10 MED ORDER — LANTUS SOLOSTAR 100 UNIT/ML ~~LOC~~ SOPN: 40.0000 [IU] | PEN_INJECTOR | Freq: Every day | SUBCUTANEOUS | 11 refills | Status: DC

## 2018-12-10 NOTE — Patient Instructions (Addendum)
F/U 4 months  Referral to eye doctor  Flu shot  Lantus 40 units at bedtime

## 2018-12-10 NOTE — Progress Notes (Signed)
Subjective:   Patient presents for Medicare Annual/Subsequent preventive examination.   Review Past Medical/Family/Social: Per EMR    No new concerns today , interpreter is present and pt daughter  She did not bring her meds with her today   She did have her meter- had one CBG 515 , but the dates were off, seems it was within the past 2 weeks or so, fasting have been up to 200  no hypoglycemia epsidoes   states she is taking lantus  38 units, and 6 with meals   we had discussed previous 40 units of lantus and 10 with meals   Her sister died from [place] in [place] recently and her nephew killed himself per report - daughter states it was an accident, she has been overall okay, sleeping okay, but has been thinking a lot of her family members  Risk Factors  Current exercise habits:  none Dietary issues discussed: Yes  Cardiac risk factors: Obesity (BMI >= 30 kg/m2). DM, HTN  Depression Screen  (Note: if answer to either of the following is "Yes", a more complete depression screening is indicated)  Over the past two weeks, have you felt down, depressed or hopeless? No Over the past two weeks, have you felt little interest or pleasure in doing things? No Have you lost interest or pleasure in daily life? No Do you often feel hopeless? No Do you cry easily over simple problems? No   Activities of Daily Living  In your present state of health, do you have any difficulty performing the following activities?:  Driving? No  Managing money? No  Feeding yourself? No  Getting from bed to chair? No  Climbing a flight of stairs? No  Preparing food and eating?: No  Bathing or showering? No  Getting dressed: No  Getting to the toilet? No  Using the toilet:No  Moving around from place to place: No  In the past year have you fallen or had a near fall?:No  Are you sexually active? No  Do you have more than one partner? No   Hearing Difficulties: No  Do you often ask people to speak up or  repeat themselves? No  Do you experience ringing or noises in your ears? No Do you have difficulty understanding soft or whispered voices? No  Do you feel that you have a problem with memory? No Do you often misplace items? No  Do you feel safe at home? Yes  Cognitive Testing  Alert? Yes Normal Appearance?Yes  Oriented to person? Yes Place? Yes  Time? Yes  Recall of three objects? Yes  Can perform simple calculations? Yes  Displays appropriate judgment?Yes  Can read the correct time from a watch face?Yes   List the Names of Other Physician/Practitioners you currently use:    [facility]   Screening Tests / Date                   Pneumonia- UTD Shingles- not covered  Influenza Vaccine  Due  Tetanus/tdap - not covered   ROS: GEN- denies fatigue, fever, weight loss,weakness, recent illness HEENT- denies eye drainage, change in vision, nasal discharge, CVS- denies chest pain, palpitations RESP- denies SOB, cough, wheeze ABD- denies N/V, change in stools, abd pain GU- denies dysuria, hematuria, dribbling, incontinence MSK- denies joint pain, muscle aches, injury Neuro- denies headache, dizziness, syncope, seizure activity  Physical: vitals reviewed  GEN- NAD, alert and oriented x3 HEENT- PERRL, EOMI, non injected sclera, pink conjunctiva, MMM, oropharynx clear Neck-  Supple, no thryomegaly CVS- RRR, no murmur RESP-CTAB ABD-NABS,soft,NT,ND Psych- normal affect and moo EXT- No edema Pulses- Radial, DP- 2+   No recent falls Audit C/depression screen neg   Assessment:    Annual wellness medicare exam   Plan:    During the course of the visit the patient was educated and counseled about appropriate screening and preventive services including:   Immunizations- due for flu shot   DM- uncontrolled, goal A1C less than 8 %, she has been changing her insulin doses, advised to call for high sugars as well- though we have discussed this  Increase Lantus to 40  units, novolog keep at  6 units since she didn't change from before, and I can see what her fasting readings will do   check labns and renal function today    CHF- currently compensated on lasix   Pulmonary HTN- no recent flares of asthma/bronchitis, inhalers and nebs refilled  Osteoporosis- on calcium and D   DDD-followed by orthopedics - has norco, on occ ultram   CKD- will monitor function on diuiretics   Mild hepatic cirrhosis- no symptoms from this, noted on imaging   Discussed advanced directives - states she has something written up already , given handout        Diet review for nutrition referral? Yes ____ Not Indicated __x__  Patient Instructions (the written plan) was given to the patient.  Medicare Attestation  I have personally reviewed:  The patient's medical and social history  Their use of alcohol, tobacco or illicit drugs  Their current medications and supplements  The patient's functional ability including ADLs,fall risks, home safety risks, cognitive, and hearing and visual impairment  Diet and physical activities  Evidence for depression or mood disorders  The patient's weight, height, BMI, and visual acuity have been recorded in the chart. I have made referrals, counseling, and provided education to the patient based on review of the above and I have provided the patient with a written personalized care plan for preventive services.

## 2018-12-11 LAB — CBC WITH DIFFERENTIAL/PLATELET
Absolute Monocytes: 848 cells/uL (ref 200–950)
Basophils Absolute: 74 cells/uL (ref 0–200)
Basophils Relative: 0.7 %
Eosinophils Absolute: 233 cells/uL (ref 15–500)
Eosinophils Relative: 2.2 %
HCT: 38.5 % (ref 35.0–45.0)
Hemoglobin: 12.1 g/dL (ref 11.7–15.5)
Lymphs Abs: 4452 cells/uL — ABNORMAL HIGH (ref 850–3900)
MCH: 26.1 pg — ABNORMAL LOW (ref 27.0–33.0)
MCHC: 31.4 g/dL — ABNORMAL LOW (ref 32.0–36.0)
MCV: 83.2 fL (ref 80.0–100.0)
MPV: 10.5 fL (ref 7.5–12.5)
Monocytes Relative: 8 %
Neutro Abs: 4993 cells/uL (ref 1500–7800)
Neutrophils Relative %: 47.1 %
Platelets: 244 10*3/uL (ref 140–400)
RBC: 4.63 10*6/uL (ref 3.80–5.10)
RDW: 15 % (ref 11.0–15.0)
Total Lymphocyte: 42 %
WBC: 10.6 10*3/uL (ref 3.8–10.8)

## 2018-12-11 LAB — COMPREHENSIVE METABOLIC PANEL
AG Ratio: 1.6 (calc) (ref 1.0–2.5)
ALT: 19 U/L (ref 6–29)
AST: 22 U/L (ref 10–35)
Albumin: 4.2 g/dL (ref 3.6–5.1)
Alkaline phosphatase (APISO): 69 U/L (ref 37–153)
BUN/Creatinine Ratio: 21 (calc) (ref 6–22)
BUN: 20 mg/dL (ref 7–25)
CO2: 21 mmol/L (ref 20–32)
Calcium: 10.1 mg/dL (ref 8.6–10.4)
Chloride: 97 mmol/L — ABNORMAL LOW (ref 98–110)
Creat: 0.95 mg/dL — ABNORMAL HIGH (ref 0.60–0.88)
Globulin: 2.7 g/dL (calc) (ref 1.9–3.7)
Glucose, Bld: 46 mg/dL — ABNORMAL LOW (ref 65–99)
Potassium: 4.3 mmol/L (ref 3.5–5.3)
Sodium: 130 mmol/L — ABNORMAL LOW (ref 135–146)
Total Bilirubin: 0.4 mg/dL (ref 0.2–1.2)
Total Protein: 6.9 g/dL (ref 6.1–8.1)

## 2018-12-11 LAB — HEMOGLOBIN A1C
Hgb A1c MFr Bld: 8.1 % of total Hgb — ABNORMAL HIGH (ref <5.7)
Mean Plasma Glucose: 186 (calc)
eAG (mmol/L): 10.3 (calc)

## 2018-12-11 LAB — LIPID PANEL
Cholesterol: 137 mg/dL (ref <200)
HDL: 60 mg/dL (ref >=50)
LDL Cholesterol (Calc): 57 mg/dL (calc)
Non-HDL Cholesterol (Calc): 77 mg/dL (calc) (ref <130)
Total CHOL/HDL Ratio: 2.3 (calc) (ref <5.0)
Triglycerides: 112 mg/dL (ref <150)

## 2018-12-11 LAB — MICROALBUMIN / CREATININE URINE RATIO
Creatinine, Urine: 92 mg/dL (ref 20–275)
Microalb Creat Ratio: 8 mcg/mg creat (ref <30)
Microalb, Ur: 0.7 mg/dL

## 2018-12-12 ENCOUNTER — Encounter: Payer: Self-pay | Admitting: Family Medicine

## 2018-12-12 NOTE — Assessment & Plan Note (Signed)
Increase lantus to 40 units Referral eye doctor

## 2018-12-15 ENCOUNTER — Encounter: Payer: Medicaid Other | Admitting: Physical Medicine and Rehabilitation

## 2018-12-22 ENCOUNTER — Ambulatory Visit: Payer: Medicaid Other | Admitting: Orthopaedic Surgery

## 2018-12-29 ENCOUNTER — Encounter: Payer: Self-pay | Admitting: Orthopaedic Surgery

## 2018-12-29 ENCOUNTER — Other Ambulatory Visit: Payer: Self-pay

## 2018-12-29 ENCOUNTER — Ambulatory Visit (INDEPENDENT_AMBULATORY_CARE_PROVIDER_SITE_OTHER): Payer: Medicaid Other | Admitting: Orthopaedic Surgery

## 2018-12-29 DIAGNOSIS — G8929 Other chronic pain: Secondary | ICD-10-CM | POA: Diagnosis not present

## 2018-12-29 DIAGNOSIS — M545 Low back pain, unspecified: Secondary | ICD-10-CM

## 2018-12-29 DIAGNOSIS — M5442 Lumbago with sciatica, left side: Secondary | ICD-10-CM | POA: Diagnosis not present

## 2018-12-29 MED ORDER — HYDROCODONE-ACETAMINOPHEN 5-325 MG PO TABS: 1.0000 | ORAL_TABLET | Freq: Four times a day (QID) | ORAL | 0 refills | Status: DC | PRN

## 2018-12-29 MED ORDER — GABAPENTIN 100 MG PO CAPS: 100.0000 mg | ORAL_CAPSULE | Freq: Three times a day (TID) | ORAL | 2 refills | Status: DC

## 2018-12-29 NOTE — Progress Notes (Signed)
The patient is well-known to me.  She is Liz's grandmother.  A month ago she did have an intervention her lumbar spine by Dr. Ernestina Patches at the left side.  She has severe degenerative changes in her lumbar spine.  She said that injection helped and she is about 50% better.  She does use an exercise bike and she still has radicular pain going down her left leg into her left foot.  She says her back pain has come down significantly.  On exam her hip on the left side moves smoothly as is her knee.  She has had replacements of her joints.  Her pain seems to be still low back and sciatic related.  She is the perfect candidate for outpatient physical therapy modalities such as dry needling.  She is leaving for [place] on November 10 and may be gone a month.  She knows to contact us when she is back in the country for setting her up for outpatient therapy.  I am going to have her take an occasional hydrocodone when the pain is severe and continue Neurontin.  We will send this into her pharmacy.  All question concerns were answered and addressed.

## 2018-12-30 NOTE — Progress Notes (Signed)
CECILA HACKBARTH - 84 y.o. female MRN [id number]  Date of birth: 1934-03-17  Office Visit Note: Visit Date: 11/29/2018 PCP: Alycia Rossetti, MD Referred by: Alycia Rossetti, MD  Subjective: Chief Complaint  Patient presents with  . Lower Back - Pain  . Left Leg - Pain   HPI:  Joyce Gilmore is a 84 y.o. female who comes in today For planned left L4 transforaminal epidural steroid injection.  In the past we had completed epidural injection from an interlaminar approach which really did not give her much relief.  MRI reviewed again shows disc herniation at L4-5 with multifactorial stenosis.  She is having left radicular leg pain.  History complicated by insulin-dependent diabetes.  ROS Otherwise per HPI.  Assessment & Plan: Visit Diagnoses:  1. Lumbar radiculopathy     Plan: No additional findings.   Meds & Orders:  Meds ordered this encounter  Medications  . betamethasone acetate-betamethasone sodium phosphate (CELESTONE) injection 12 mg    Orders Placed This Encounter  Procedures  . XR C-ARM NO REPORT  . Epidural Steroid injection    Follow-up: Return if symptoms worsen or fail to improve.   Procedures: No procedures performed  Lumbosacral Transforaminal Epidural Steroid Injection - Sub-Pedicular Approach with Fluoroscopic Guidance  Patient: JAYLAN LORES      Date of Birth: July 29, 1934 MRN: [id number] PCP: Alycia Rossetti, MD      Visit Date: 11/29/2018   Universal Protocol:    Date/Time: 11/29/2018  Consent Given By: the patient  Position: PRONE  Additional Comments: Vital signs were monitored before and after the procedure. Patient was prepped and draped in the usual sterile fashion. The correct patient, procedure, and site was verified.   Injection Procedure Details:  Procedure Site One Meds Administered:  Meds ordered this encounter  Medications  . betamethasone acetate-betamethasone sodium phosphate (CELESTONE) injection 12 mg    Laterality:  Left  Location/Site:  L4-L5  Needle size: 22 G  Needle type: Spinal  Needle Placement: Transforaminal  Findings:    -Comments: Excellent flow of contrast along the nerve and into the epidural space.  Procedure Details: After squaring off the end-plates to get a true AP view, the C-arm was positioned so that an oblique view of the foramen as noted above was visualized. The target area is just inferior to the "nose of the scotty dog" or sub pedicular. The soft tissues overlying this structure were infiltrated with 2-3 ml. of 1% Lidocaine without Epinephrine.  The spinal needle was inserted toward the target using a "trajectory" view along the fluoroscope beam.  Under AP and lateral visualization, the needle was advanced so it did not puncture dura and was located close the 6 O'Clock position of the pedical in AP tracterory. Biplanar projections were used to confirm position. Aspiration was confirmed to be negative for CSF and/or blood. A 1-2 ml. volume of Isovue-250 was injected and flow of contrast was noted at each level. Radiographs were obtained for documentation purposes.   After attaining the desired flow of contrast documented above, a 0.5 to 1.0 ml test dose of 0.25% Marcaine was injected into each respective transforaminal space.  The patient was observed for 90 seconds post injection.  After no sensory deficits were reported, and normal lower extremity motor function was noted,   the above injectate was administered so that equal amounts of the injectate were placed at each foramen (level) into the transforaminal epidural space.   Additional Comments:  The patient  tolerated the procedure well Dressing: 2 x 2 sterile gauze and Band-Aid    Post-procedure details: Patient was observed during the procedure. Post-procedure instructions were reviewed.  Patient left the clinic in stable condition.    Clinical History: MRI LUMBAR SPINE WITHOUT CONTRAST  TECHNIQUE: Multiplanar,  multisequence MR imaging of the lumbar spine was performed. No intravenous contrast was administered.  COMPARISON:  11/23/2014  FINDINGS: Segmentation: The numbering used on the prior study is continued on today's examination with the lowest fully formed intervertebral disc space designated L5-S1. With this numbering, there are no ribs at T12.  Alignment: Chronically exaggerated lumbar lordosis. New or increased anterolisthesis of L4 on L5 measuring 5 mm. Mild lumbar levoscoliosis.  Vertebrae: No fracture, suspicious osseous lesion, or significant marrow edema. Unchanged L3 vertebral body hemangioma. 10 mm mass in the left aspect of the spinal canal at T9-10, only imaged sagittally (previously 7 mm).  Conus medullaris and cauda equina: Conus extends to the T12-L1 level. Conus and cauda equina appear normal.  Paraspinal and other soft tissues: Chronic severe right renal atrophy and hydronephrosis.  Disc levels:  Disc desiccation throughout the lumbar spine with exception of L5-S1.  T10-11: Only imaged sagittally. Minimal disc bulging and mild facet arthrosis without significant stenosis, unchanged.  T11-12: Only imaged sagittally. Mild disc bulging and small central disc protrusion without stenosis, unchanged.  T12-L1: Mild disc bulging greater to the left and mild facet arthrosis without stenosis, unchanged.  L1-2: Mild disc bulging greater to the right and mild facet hypertrophy result in borderline to mild right lateral recess stenosis without spinal or neural foraminal stenosis, unchanged.  L2-3: Mild disc bulging and mild facet and ligamentum flavum hypertrophy without stenosis, unchanged.  L3-4: Disc bulging greater to the right and mild right and moderate left facet hypertrophy result in mild right neural foraminal stenosis without spinal stenosis, unchanged.  L4-5: New mild disc space narrowing. Anterolisthesis with bulging uncovered disc, a  new large left paracentral and subarticular disc extrusion with superior migration to the L4 pedicle level, and severe facet and ligamentum flavum hypertrophy result in progressive severe spinal stenosis, severe left and moderate right lateral recess stenosis, and mild-to-moderate right and moderate to severe left neural foraminal stenosis. The disc extrusion may affect the left L4 and L5 nerve roots. There are bilateral facet joint effusions.  L5-S1: Minimal leftward disc bulging and severe facet hypertrophy result in moderate left lateral recess and moderate left neural foraminal stenosis, unchanged. No spinal stenosis.  IMPRESSION: 1. Progressive disc and facet degeneration at L4-5 with new anterolisthesis and a new large left paracentral/subarticular disc extrusion. Severe spinal and left lateral recess stenosis and moderate to severe left neural foraminal stenosis. 2. Unchanged disc and facet degeneration elsewhere as above. 3. Mildly increased size of left-sided spinal canal mass at T9-10, now 10 mm. Slow growth favors a benign entity such as a peripheral nerve sheath tumor, however consider thoracic spine MRI without and with contrast for further assessment.   Electronically Signed   By: Logan Bores M.D.   On: 01/09/2018 07:36     Objective:  VS:  HT:    WT:   BMI:     BP:(!) 153/68  HR:78bpm  TEMP: ( )  RESP:  Physical Exam  Ortho Exam Imaging: No results found.

## 2018-12-30 NOTE — Procedures (Signed)
Lumbosacral Transforaminal Epidural Steroid Injection - Sub-Pedicular Approach with Fluoroscopic Guidance  Patient: Joyce Gilmore      Date of Birth: [birth year] MRN: [id number] PCP: Alycia Rossetti, MD      Visit Date: 11/29/2018   Universal Protocol:    Date/Time: 11/29/2018  Consent Given By: the patient  Position: PRONE  Additional Comments: Vital signs were monitored before and after the procedure. Patient was prepped and draped in the usual sterile fashion. The correct patient, procedure, and site was verified.   Injection Procedure Details:  Procedure Site One Meds Administered:  Meds ordered this encounter  Medications  . betamethasone acetate-betamethasone sodium phosphate (CELESTONE) injection 12 mg    Laterality: Left  Location/Site:  L4-L5  Needle size: 22 G  Needle type: Spinal  Needle Placement: Transforaminal  Findings:    -Comments: Excellent flow of contrast along the nerve and into the epidural space.  Procedure Details: After squaring off the end-plates to get a true AP view, the C-arm was positioned so that an oblique view of the foramen as noted above was visualized. The target area is just inferior to the "nose of the scotty dog" or sub pedicular. The soft tissues overlying this structure were infiltrated with 2-3 ml. of 1% Lidocaine without Epinephrine.  The spinal needle was inserted toward the target using a "trajectory" view along the fluoroscope beam.  Under AP and lateral visualization, the needle was advanced so it did not puncture dura and was located close the 6 O'Clock position of the pedical in AP tracterory. Biplanar projections were used to confirm position. Aspiration was confirmed to be negative for CSF and/or blood. A 1-2 ml. volume of Isovue-250 was injected and flow of contrast was noted at each level. Radiographs were obtained for documentation purposes.   After attaining the desired flow of contrast documented above, a 0.5 to  1.0 ml test dose of 0.25% Marcaine was injected into each respective transforaminal space.  The patient was observed for 90 seconds post injection.  After no sensory deficits were reported, and normal lower extremity motor function was noted,   the above injectate was administered so that equal amounts of the injectate were placed at each foramen (level) into the transforaminal epidural space.   Additional Comments:  The patient tolerated the procedure well Dressing: 2 x 2 sterile gauze and Band-Aid    Post-procedure details: Patient was observed during the procedure. Post-procedure instructions were reviewed.  Patient left the clinic in stable condition.

## 2019-01-03 ENCOUNTER — Encounter (INDEPENDENT_AMBULATORY_CARE_PROVIDER_SITE_OTHER): Payer: Medicaid Other | Admitting: Ophthalmology

## 2019-01-28 ENCOUNTER — Other Ambulatory Visit: Payer: Self-pay | Admitting: *Deleted

## 2019-01-28 MED ORDER — BENZONATATE 100 MG PO CAPS: ORAL_CAPSULE | ORAL | 0 refills | Status: DC

## 2019-03-10 ENCOUNTER — Other Ambulatory Visit: Payer: Self-pay | Admitting: Family Medicine

## 2019-03-14 ENCOUNTER — Other Ambulatory Visit: Payer: Self-pay

## 2019-03-14 ENCOUNTER — Encounter (INDEPENDENT_AMBULATORY_CARE_PROVIDER_SITE_OTHER): Payer: Medicaid Other | Admitting: Family Medicine

## 2019-03-14 NOTE — Progress Notes (Signed)
Virtual Visit via Telephone Note  I connected with Joyce Gilmore on 03/14/19 at 4:21pm by telephone and verified that I am speaking with the correct person using two identifiers.   I discussed the limitations, risks, security and privacy concerns of performing an evaluation and management service by telephone and the availability of in person appointments. I also discussed with the patient that there may be a patient responsible charge related to this service. The patient expressed understanding and agreed to proceed.   History of Present Illness: Patient returns from [place] last week on the 12th however she is with her daughter who does not speak English with her granddaughter lives is not available within the home.  They cannot understand me over the phone we do not have an interpreter available.  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  They did state that she is doing fine otherwise.  Today's telehealth visit will be canceled as we cannot complete in detail she will come into the office next week 2:00

## 2019-03-22 ENCOUNTER — Encounter: Payer: Self-pay | Admitting: Family Medicine

## 2019-03-22 ENCOUNTER — Other Ambulatory Visit: Payer: Self-pay

## 2019-03-22 ENCOUNTER — Ambulatory Visit (INDEPENDENT_AMBULATORY_CARE_PROVIDER_SITE_OTHER): Payer: Medicaid Other | Admitting: Family Medicine

## 2019-03-22 VITALS — BP 140/78 | HR 74 | Temp 98.1°F | Resp 14 | Ht <= 58 in | Wt 157.0 lb

## 2019-03-22 DIAGNOSIS — E7849 Other hyperlipidemia: Secondary | ICD-10-CM

## 2019-03-22 DIAGNOSIS — M5136 Other intervertebral disc degeneration, lumbar region: Secondary | ICD-10-CM

## 2019-03-22 DIAGNOSIS — Z794 Long term (current) use of insulin: Secondary | ICD-10-CM

## 2019-03-22 DIAGNOSIS — N182 Chronic kidney disease, stage 2 (mild): Secondary | ICD-10-CM

## 2019-03-22 DIAGNOSIS — N1831 Chronic kidney disease, stage 3a: Secondary | ICD-10-CM | POA: Diagnosis not present

## 2019-03-22 DIAGNOSIS — K219 Gastro-esophageal reflux disease without esophagitis: Secondary | ICD-10-CM | POA: Diagnosis not present

## 2019-03-22 DIAGNOSIS — I10 Essential (primary) hypertension: Secondary | ICD-10-CM

## 2019-03-22 DIAGNOSIS — Z974 Presence of external hearing-aid: Secondary | ICD-10-CM | POA: Diagnosis not present

## 2019-03-22 DIAGNOSIS — E1122 Type 2 diabetes mellitus with diabetic chronic kidney disease: Secondary | ICD-10-CM | POA: Diagnosis not present

## 2019-03-22 DIAGNOSIS — J4540 Moderate persistent asthma, uncomplicated: Secondary | ICD-10-CM

## 2019-03-22 MED ORDER — TRAMADOL HCL 50 MG PO TABS: 50.0000 mg | ORAL_TABLET | Freq: Two times a day (BID) | ORAL | 1 refills | Status: DC | PRN

## 2019-03-22 MED ORDER — AMLODIPINE BESYLATE 5 MG PO TABS: 5.0000 mg | ORAL_TABLET | Freq: Every day | ORAL | 1 refills | Status: DC

## 2019-03-22 MED ORDER — BD PEN NEEDLE SHORT U/F 31G X 8 MM MISC: 11 refills | Status: DC

## 2019-03-22 MED ORDER — ARNUITY ELLIPTA 100 MCG/ACT IN AEPB: 1.0000 | INHALATION_SPRAY | Freq: Every day | RESPIRATORY_TRACT | 3 refills | Status: DC

## 2019-03-22 NOTE — Patient Instructions (Addendum)
  COVID Vaccination Information As of right now, we will not be giving COVID-19 vaccines here in our office. It is too many storage and administrating regulations that our office is not equipped to provide at this time.   You can go online at [email or web address]  That website will give you information on all counties.   If not here are the numbers you can call. [place] - [place] or [place] [phone] opt.2 [facility] Department [phone]  You can also find information on [email or web address] or call the state's COVID-19 information phone number at [phone].   Ultram for pain as needed Stop the carafate (sucralfate) We will call with lab results F/U 4 months

## 2019-03-22 NOTE — Assessment & Plan Note (Signed)
D/c carafate Continue prevacid

## 2019-03-22 NOTE — Assessment & Plan Note (Signed)
Goal A1C less than 8% she has been close to this, but often not compliant with the insulin changes I request she make Recheck A1C today and renal function

## 2019-03-22 NOTE — Assessment & Plan Note (Signed)
Ultram refilled.

## 2019-03-22 NOTE — Assessment & Plan Note (Signed)
Pt out of norvasc, refilled today, no changes Discussed the importance of her medications

## 2019-03-22 NOTE — Progress Notes (Signed)
Subjective:    Patient ID: Joyce Gilmore, female    DOB: 15-Apr-1934, 85 y.o.   MRN: [id number]  Patient presents for Follow-up (is not fasting) Here to follow-up chronic medical problems.  She returned from [place] 2 weeks ago.  I attempted a phone visit with him however there was no interpreter available for the family.  I had her wait 2 weeks in case there was any viral contact during her trip.  Spanish interpreter is available today   Meds reviewed   At her last visit we discussed multiple medical problems.  DM- uncontrolled last A1C 8.1% in Oct ,I increased  Lantus to 40 units, novolog keep at  6 units, she has been taking lantus 36 units , did not bring her meter    CBG range-    On pravastatin for lipids - last LDL 57  In Oct    CHF- currently compensated on lasix   Pulmonary HTN- no recent flares of asthma/bronchitis, inhalers and nebs refilled at last visit but she has not used the Breo this past month,states when the old inhaler ran out, she threw it away, has tessalon on hand    DDD-followed by orthopedics - has norco, on occ ultram   CKD- will monitor function on diuiretics   Mild hepatic cirrhosis- no symptoms from this, noted on imaging      Chronic back pain followed by orthopedics- does not have any pain meds at home, thought it was just for 30 days, they helped a little  GERD- she has only been taking prevacid once a day and carafate once a day       HTn- taking norvasc 5mg , and lisinopril 10mg  once a day      Note she states doctor in [place] told her she was taking too many meds, so she started changing things   Review Of Systems:  GEN- denies fatigue, fever, weight loss,weakness, recent illness HEENT- denies eye drainage, change in vision, nasal discharge, CVS- denies chest pain, palpitations RESP- denies SOB, cough, wheeze ABD- denies N/V, change in stools, abd pain GU- denies dysuria, hematuria, dribbling, incontinence MSK- + joint pain, muscle  aches, injury Neuro- denies headache, dizziness, syncope, seizure activity       Objective:    BP 140/78   Pulse 74   Temp 98.1 F (36.7 C) (Temporal)   Resp 14   Ht 4\' 8"  (1.422 m)   Wt 157 lb (71.2 kg)   SpO2 97%   BMI 35.20 kg/m  GEN- NAD, alert and oriented x3 HEENT- PERRL, EOMI, non injected sclera, pink conjunctiva, MMM, oropharynx clear Neck- Supple, no thyromegaly CVS- RRR, no murmur RESP-CTAB ABD-NABS,soft,NT,ND EXT- No edema Pulses- Radial, DP- 2+        Assessment & Plan:      Problem List Items Addressed This Visit      Unprioritized   Asthmatic bronchitis    Restart Breo, daughter states they had to use nebulizer last week      Relevant Medications   Fluticasone Furoate (ARNUITY ELLIPTA) 100 MCG/ACT AEPB   Chronic kidney disease   Relevant Orders   Comprehensive metabolic panel   DDD (degenerative disc disease), lumbar    Ultram refilled      Relevant Medications   traMADol (ULTRAM) 50 MG tablet   Essential hypertension - Primary    Pt out of norvasc, refilled today, no changes Discussed the importance of her medications       Relevant Medications  amLODipine (NORVASC) 5 MG tablet   Other Relevant Orders   Comprehensive metabolic panel   CBC with Differential/Platelet   GERD (gastroesophageal reflux disease)    D/c carafate Continue prevacid      HLD (hyperlipidemia)   Relevant Medications   amLODipine (NORVASC) 5 MG tablet   Type 2 diabetes mellitus with diabetic chronic kidney disease (HCC)    Goal A1C less than 8% she has been close to this, but often not compliant with the insulin changes I request she make Recheck A1C today and renal function      Relevant Orders   Hemoglobin A1c   Uses hearing aid      Note: This dictation was prepared with Dragon dictation along with smaller phrase technology. Any transcriptional errors that result from this process are unintentional.

## 2019-03-22 NOTE — Assessment & Plan Note (Signed)
Joyce Gilmore, daughter states they had to use nebulizer last week

## 2019-03-23 LAB — COMPREHENSIVE METABOLIC PANEL
AG Ratio: 1.4 (calc) (ref 1.0–2.5)
ALT: 15 U/L (ref 6–29)
AST: 22 U/L (ref 10–35)
Albumin: 4 g/dL (ref 3.6–5.1)
Alkaline phosphatase (APISO): 71 U/L (ref 37–153)
BUN/Creatinine Ratio: 20 (calc) (ref 6–22)
BUN: 20 mg/dL (ref 7–25)
CO2: 25 mmol/L (ref 20–32)
Calcium: 9.4 mg/dL (ref 8.6–10.4)
Chloride: 101 mmol/L (ref 98–110)
Creat: 1.01 mg/dL — ABNORMAL HIGH (ref 0.60–0.88)
Globulin: 2.8 g/dL (calc) (ref 1.9–3.7)
Glucose, Bld: 150 mg/dL — ABNORMAL HIGH (ref 65–99)
Potassium: 4.4 mmol/L (ref 3.5–5.3)
Sodium: 136 mmol/L (ref 135–146)
Total Bilirubin: 0.2 mg/dL (ref 0.2–1.2)
Total Protein: 6.8 g/dL (ref 6.1–8.1)

## 2019-03-23 LAB — CBC WITH DIFFERENTIAL/PLATELET
Absolute Monocytes: 501 cells/uL (ref 200–950)
Basophils Absolute: 52 cells/uL (ref 0–200)
Basophils Relative: 0.8 %
Eosinophils Absolute: 228 cells/uL (ref 15–500)
Eosinophils Relative: 3.5 %
HCT: 37.5 % (ref 35.0–45.0)
Hemoglobin: 11.8 g/dL (ref 11.7–15.5)
Lymphs Abs: 2496 cells/uL (ref 850–3900)
MCH: 25.1 pg — ABNORMAL LOW (ref 27.0–33.0)
MCHC: 31.5 g/dL — ABNORMAL LOW (ref 32.0–36.0)
MCV: 79.6 fL — ABNORMAL LOW (ref 80.0–100.0)
MPV: 11.6 fL (ref 7.5–12.5)
Monocytes Relative: 7.7 %
Neutro Abs: 3224 cells/uL (ref 1500–7800)
Neutrophils Relative %: 49.6 %
Platelets: 216 10*3/uL (ref 140–400)
RBC: 4.71 10*6/uL (ref 3.80–5.10)
RDW: 14.4 % (ref 11.0–15.0)
Total Lymphocyte: 38.4 %
WBC: 6.5 10*3/uL (ref 3.8–10.8)

## 2019-03-23 LAB — HEMOGLOBIN A1C
Hgb A1c MFr Bld: 8.2 % of total Hgb — ABNORMAL HIGH (ref <5.7)
Mean Plasma Glucose: 189 (calc)
eAG (mmol/L): 10.4 (calc)

## 2019-04-20 ENCOUNTER — Telehealth: Payer: Self-pay | Admitting: *Deleted

## 2019-04-20 MED ORDER — ARNUITY ELLIPTA 100 MCG/ACT IN AEPB: 1.0000 | INHALATION_SPRAY | Freq: Every day | RESPIRATORY_TRACT | 3 refills | Status: DC

## 2019-04-20 NOTE — Telephone Encounter (Signed)
Received request from pharmacy for [facility] [address].   Patient has tried and failed Flovent/ Pulmicort.   PA submitted.   Dx: J45.40- asthmatic bronchitis.   Received immediate determination.   PA [id number] approved 04/20/2019- 04/19/2020.  Pharmacy made aware.

## 2019-05-06 ENCOUNTER — Telehealth: Payer: Self-pay | Admitting: Family Medicine

## 2019-05-06 ENCOUNTER — Other Ambulatory Visit: Payer: Self-pay | Admitting: Family Medicine

## 2019-05-06 NOTE — Telephone Encounter (Signed)
Patients grandaughter calling

## 2019-05-17 ENCOUNTER — Ambulatory Visit
Admission: RE | Admit: 2019-05-17 | Discharge: 2019-05-17 | Disposition: A | Discharge status: Home / Self Care | Payer: Medicaid Other | Source: Ambulatory Visit | Attending: Nurse Practitioner | Admitting: Nurse Practitioner

## 2019-05-17 ENCOUNTER — Other Ambulatory Visit: Payer: Self-pay

## 2019-05-17 ENCOUNTER — Ambulatory Visit (INDEPENDENT_AMBULATORY_CARE_PROVIDER_SITE_OTHER): Payer: Medicaid Other | Admitting: Nurse Practitioner

## 2019-05-17 VITALS — BP 150/78 | HR 66 | Temp 97.5°F | Resp 18 | Ht <= 58 in | Wt 159.0 lb

## 2019-05-17 DIAGNOSIS — R109 Unspecified abdominal pain: Secondary | ICD-10-CM | POA: Diagnosis not present

## 2019-05-17 DIAGNOSIS — R1084 Generalized abdominal pain: Secondary | ICD-10-CM | POA: Diagnosis not present

## 2019-05-17 NOTE — Progress Notes (Signed)
Acute Office Visit  Subjective:    Patient ID: Joyce Gilmore, female    DOB: [birth year], 85 y.o.   MRN: [id number]  Chief Complaint  Patient presents with  . Abdominal Pain    feel nauseated after eating, started x3 weeks, tried teas with no relief    HPI Patient is a 85 year old Hispanic female accompanied by daughter and granddaughter for sxs of 2 weeks of abdominal pain. She has tried treatment of tea which did not alleviate sxs. The pain is 5/10 generalized abdominal pain. Exacerbating factor is eating that causes nausea; not eating alleviates nausea. The pain comes and goes is described as achy. She reports daily normal color and consistency BM. No known acute episodes or injury at time of onset. H/o constipation. Denied urinary pain, burning, hesitancy, decreased/darker urine, incontinence, back pain, foul urine odor, fever/chills.  no cp/ct, other gu/gi sxs, no other pain, no sob.  Past Medical History:  Diagnosis Date  . Arthritis   . CHF (congestive heart failure) (HCC)    diastolic  . Chronic kidney disease    hx of kidney stones,   . Depression    hx of   . Diabetes mellitus without complication ([place])   . Ganglion, left ankle and foot   . Hearing loss of both ears   . Hypertension   . MRSA infection    Oct 13 - Nov 13  . Osteoporosis   . Tinnitus of both ears     Past Surgical History:  Procedure Laterality Date  . BACK SURGERY     history restored after error with record merge      . INCISION AND DRAINAGE HIP  12/22/2011   Procedure: IRRIGATION AND DEBRIDEMENT HIP;  Surgeon: Mcarthur Rossetti, MD;  Location: [facility];  Service: Orthopedics;  Laterality: Right;  Irrigation and debridement right hip  . INCISION AND DRAINAGE HIP  12/27/2011   Procedure: IRRIGATION AND DEBRIDEMENT HIP;  Surgeon: Mcarthur Rossetti, MD;  Location: [facility];  Service: Orthopedics;  Laterality: Right;  Repeat irrigation and debridement  . JOINT REPLACEMENT     bilateral knee,  right hip   . TOTAL HIP REVISION  12/05/2011   Procedure: TOTAL HIP REVISION;  Surgeon: Mcarthur Rossetti, MD;  Location: [facility];  Service: Orthopedics;  Laterality: Right;  Right Hip Revision Arthroplasty to Total Hip, Excision of Old Implant    Family History  Problem Relation Age of Onset  . Hypertension Mother   . Colon cancer Neg Hx   . Breast cancer Neg Hx     Social History   Socioeconomic History  . Marital status: Widowed    Spouse name: Not on file  . Number of children: 7  . Years of education: Not on file  . Highest education level: Not on file  Occupational History  . Not on file  Tobacco Use  . Smoking status: Never Smoker  . Smokeless tobacco: Never Used  Substance and Sexual Activity  . Alcohol use: No    Alcohol/week: 0.0 standard drinks  . Drug use: No  . Sexual activity: Not Currently  Other Topics Concern  . Not on file  Social History Narrative   ** Merged History Encounter **       Social Determinants of Health   Financial Resource Strain:   . Difficulty of Paying Living Expenses:   Food Insecurity:   . Worried About Charity fundraiser in the Last Year:   .  Ran Out of Food in the Last Year:   Transportation Needs:   . Film/video editor (Medical):   Marland Kitchen Lack of Transportation (Non-Medical):   Physical Activity:   . Days of Exercise per Week:   . Minutes of Exercise per Session:   Stress:   . Feeling of Stress :   Social Connections:   . Frequency of Communication with Friends and Family:   . Frequency of Social Gatherings with Friends and Family:   . Attends Religious Services:   . Active Member of Clubs or Organizations:   . Attends Archivist Meetings:   Marland Kitchen Marital Status:   Intimate Partner Violence:   . Fear of Current or Ex-Partner:   . Emotionally Abused:   Marland Kitchen Physically Abused:   . Sexually Abused:     Outpatient Medications Prior to Visit  Medication Sig Dispense Refill  . Accu-Chek Softclix Lancets  lancets USE AS DIRECTED 200 each 1  . Acetaminophen (TYLENOL PO) Take 1,000 tablets by mouth every 6 (six) hours as needed (pain/headache).     Marland Kitchen albuterol (PROVENTIL) (2.5 MG/3ML) 0.083% nebulizer solution Take 3 mLs (2.5 mg total) by nebulization every 6 (six) hours as needed for wheezing or shortness of breath. 150 mL 1  . albuterol (VENTOLIN HFA) 108 (90 Base) MCG/ACT inhaler Inhale 1-2 puffs into the lungs every 4 (four) hours as needed for wheezing or shortness of breath. 18 g 3  . amLODipine (NORVASC) 5 MG tablet Take 1 tablet (5 mg total) by mouth daily. 90 tablet 1  . aspirin EC 81 MG tablet Take 81 mg by mouth daily.    . benzonatate (TESSALON) 100 MG capsule TAKE 1 CAPSULE BY MOUTH THREE TIMES DAILY AS NEEDED FOR COUGH 20 capsule 0  . blood glucose meter kit and supplies KIT Dispense based on patient and insurance preference. Use up to four times daily as directed. (FOR ICD-9 250.00, 250.01). 1 each 0  . calcium carbonate (OSCAL) 1500 (600 Ca) MG TABS tablet Take 600 mg of elemental calcium by mouth 2 (two) times daily with a meal.    . Fluticasone Furoate (ARNUITY ELLIPTA) 100 MCG/ACT AEPB Inhale 1 puff into the lungs daily. 90 each 3  . furosemide (LASIX) 20 MG tablet Take 1 tablet (20 mg total) by mouth daily. 90 tablet 3  . glucose blood (ACCU-CHEK AVIVA PLUS) test strip USE TO CHECK BLOOD SUGAR FOUR TIMES DAILY AS DIRECTED 150 each 3  . insulin aspart (NOVOLOG FLEXPEN) 100 UNIT/ML FlexPen Inject 5-15 Units into the skin 3 (three) times daily with meals. Per sliding scale 15 mL 1  . Insulin Pen Needle (B-D ULTRAFINE III SHORT PEN) 31G X 8 MM MISC USE AS DIRECTED 100 each 11  . lansoprazole (PREVACID) 30 MG capsule TAKE 1 CAPSULE BY MOUTH TWICE DAILY BEFORE A MEAL 180 capsule 3  . LANTUS SOLOSTAR 100 UNIT/ML Solostar Pen INJECT 36 UNITS UNDER THE SKIN AT BEDTIME 15 mL 5  . lisinopril (ZESTRIL) 10 MG tablet TAKE 1 TABLET BY MOUTH DAILY 90 tablet 3  . metoprolol tartrate (LOPRESSOR) 25  MG tablet Take 0.5 tablets (12.5 mg total) by mouth 2 (two) times daily. 45 tablet 3  . polyvinyl alcohol (ARTIFICIAL TEARS) 1.4 % ophthalmic solution Place 1 drop into both eyes as needed for dry eyes. 15 mL 3  . pravastatin (PRAVACHOL) 20 MG tablet Take 1 tablet (20 mg total) by mouth daily. 90 tablet 3  . traMADol (ULTRAM) 50 MG tablet Take  1 tablet (50 mg total) by mouth 2 (two) times daily as needed. Chronic pain 60 tablet 1   No facility-administered medications prior to visit.    Allergies  Allergen Reactions  . Bactrim [Sulfamethoxazole-Trimethoprim] Other (See Comments)    Hyperkalemia (elevated potassium)  . Rifampin Other (See Comments)    Severe thrombocytopenia (low blood platelet count)  . Vancomycin Other (See Comments)    Severe thrombocytopenia (low blood platelet count)    Review of Systems  All other systems reviewed and are negative.      Objective:    Physical Exam Vitals and nursing note reviewed.  Constitutional:      General: She is not in acute distress.    Appearance: Normal appearance. She is well-developed and well-groomed. She is obese. She is not ill-appearing, toxic-appearing or diaphoretic.  HENT:     Head: Normocephalic.     Nose: Nose normal.     Mouth/Throat:     Lips: Pink.  Eyes:     General: Lids are normal. Lids are everted, no foreign bodies appreciated.     Extraocular Movements: Extraocular movements intact.  Cardiovascular:     Rate and Rhythm: Normal rate and regular rhythm.  Abdominal:     General: Abdomen is flat. A surgical scar is present. Bowel sounds are decreased. There is no distension or abdominal bruit. There are no signs of injury.     Palpations: Abdomen is soft. There is no shifting dullness, fluid wave, hepatomegaly, splenomegaly, mass or pulsatile mass.     Tenderness: There is no abdominal tenderness. There is no right CVA tenderness, left CVA tenderness, guarding or rebound. Negative signs include Murphy's sign,  Rovsing's sign, McBurney's sign, psoas sign and obturator sign.     Hernia: There is no hernia in the umbilical area, ventral area, left inguinal area, right femoral area, left femoral area or right inguinal area.  Skin:    General: Skin is warm and dry.     Capillary Refill: Capillary refill takes less than 2 seconds.  Neurological:     General: No focal deficit present.     Mental Status: She is alert and oriented to person, place, and time.  Psychiatric:        Attention and Perception: Attention normal.        Mood and Affect: Mood normal.        Speech: Speech normal.        Behavior: Behavior normal. Behavior is cooperative.        Cognition and Memory: Cognition normal.        Judgment: Judgment normal.     BP (!) 150/78 (BP Location: Left Arm, Patient Position: Sitting, Cuff Size: Normal)   Pulse 66   Temp (!) 97.5 F (36.4 C) (Temporal)   Resp 18   Ht 4' 8" (1.422 m)   Wt 159 lb (72.1 kg)   SpO2 94%   BMI 35.65 kg/m  Wt Readings from Last 3 Encounters:  05/17/19 159 lb (72.1 kg)  03/22/19 157 lb (71.2 kg)  12/10/18 156 lb (70.8 kg)    Health Maintenance Due  Topic Date Due  . OPHTHALMOLOGY EXAM  Never done  . TETANUS/TDAP  01/11/2015    There are no preventive care reminders to display for this patient.   Lab Results  Component Value Date   TSH 0.655 01/03/2012   Lab Results  Component Value Date   WBC 6.5 03/22/2019   HGB 11.8 03/22/2019   HCT 37.5  03/22/2019   MCV 79.6 (L) 03/22/2019   PLT 216 03/22/2019   Lab Results  Component Value Date   NA 136 03/22/2019   K 4.4 03/22/2019   CO2 25 03/22/2019   GLUCOSE 150 (H) 03/22/2019   BUN 20 03/22/2019   CREATININE 1.01 (H) 03/22/2019   BILITOT 0.2 03/22/2019   ALKPHOS 56 05/08/2018   AST 22 03/22/2019   ALT 15 03/22/2019   PROT 6.8 03/22/2019   ALBUMIN 3.3 (L) 05/08/2018   CALCIUM 9.4 03/22/2019   ANIONGAP 8 05/10/2018   Lab Results  Component Value Date   CHOL 137 12/10/2018   Lab  Results  Component Value Date   HDL 60 12/10/2018   Lab Results  Component Value Date   LDLCALC 57 12/10/2018   Lab Results  Component Value Date   TRIG 112 12/10/2018   Lab Results  Component Value Date   CHOLHDL 2.3 12/10/2018   Lab Results  Component Value Date   HGBA1C 8.2 (H) 03/22/2019       Assessment & Plan:  Go now to complete image.  Will call with results and instructions  Problem List Items Addressed This Visit    None    Visit Diagnoses    Generalized abdominal pain    -  Primary   Relevant Orders   DG Abd 1 View     Follow Up: As needed for worsening or non resolving sxs, will call with results of image and instructions.   Annie Main, FNP

## 2019-05-18 ENCOUNTER — Other Ambulatory Visit: Payer: Self-pay | Admitting: Nurse Practitioner

## 2019-05-18 DIAGNOSIS — K219 Gastro-esophageal reflux disease without esophagitis: Secondary | ICD-10-CM

## 2019-05-18 MED ORDER — OMEPRAZOLE 40 MG PO CPDR: 40.0000 mg | DELAYED_RELEASE_CAPSULE | Freq: Every day | ORAL | 3 refills | Status: DC

## 2019-05-18 NOTE — Progress Notes (Signed)
Have her start the omeprazole that I prescribed today, take pepcid 20 mg over the counter 30 minutes prior to bedtime. The image of abdomen normal. If her sxs persist after 1 week or worsen then please follow up. Avoid spicy or greasy foods.

## 2019-06-23 ENCOUNTER — Ambulatory Visit: Payer: Medicaid Other | Admitting: Nurse Practitioner

## 2019-06-27 ENCOUNTER — Ambulatory Visit (INDEPENDENT_AMBULATORY_CARE_PROVIDER_SITE_OTHER): Payer: Medicaid Other | Admitting: Family Medicine

## 2019-06-27 ENCOUNTER — Encounter: Payer: Self-pay | Admitting: Family Medicine

## 2019-06-27 ENCOUNTER — Other Ambulatory Visit: Payer: Self-pay | Admitting: Family Medicine

## 2019-06-27 ENCOUNTER — Other Ambulatory Visit: Payer: Self-pay

## 2019-06-27 VITALS — BP 142/74 | HR 60 | Temp 98.0°F | Resp 16 | Ht <= 58 in | Wt 158.0 lb

## 2019-06-27 DIAGNOSIS — I5032 Chronic diastolic (congestive) heart failure: Secondary | ICD-10-CM

## 2019-06-27 DIAGNOSIS — J302 Other seasonal allergic rhinitis: Secondary | ICD-10-CM | POA: Diagnosis not present

## 2019-06-27 DIAGNOSIS — E1122 Type 2 diabetes mellitus with diabetic chronic kidney disease: Secondary | ICD-10-CM

## 2019-06-27 DIAGNOSIS — I10 Essential (primary) hypertension: Secondary | ICD-10-CM

## 2019-06-27 DIAGNOSIS — M5136 Other intervertebral disc degeneration, lumbar region: Secondary | ICD-10-CM | POA: Diagnosis not present

## 2019-06-27 DIAGNOSIS — E7849 Other hyperlipidemia: Secondary | ICD-10-CM

## 2019-06-27 DIAGNOSIS — Z794 Long term (current) use of insulin: Secondary | ICD-10-CM

## 2019-06-27 DIAGNOSIS — N182 Chronic kidney disease, stage 2 (mild): Secondary | ICD-10-CM

## 2019-06-27 DIAGNOSIS — N1831 Chronic kidney disease, stage 3a: Secondary | ICD-10-CM | POA: Diagnosis not present

## 2019-06-27 DIAGNOSIS — M51369 Other intervertebral disc degeneration, lumbar region without mention of lumbar back pain or lower extremity pain: Secondary | ICD-10-CM

## 2019-06-27 MED ORDER — LANTUS SOLOSTAR 100 UNIT/ML ~~LOC~~ SOPN: PEN_INJECTOR | SUBCUTANEOUS | 5 refills | Status: DC

## 2019-06-27 MED ORDER — FUROSEMIDE 20 MG PO TABS: 20.0000 mg | ORAL_TABLET | Freq: Every day | ORAL | 3 refills | Status: DC

## 2019-06-27 MED ORDER — LISINOPRIL 10 MG PO TABS: 10.0000 mg | ORAL_TABLET | Freq: Every day | ORAL | 3 refills | Status: DC

## 2019-06-27 MED ORDER — LANSOPRAZOLE 30 MG PO CPDR: 30.0000 mg | DELAYED_RELEASE_CAPSULE | Freq: Every day | ORAL | 1 refills | Status: DC

## 2019-06-27 MED ORDER — AMLODIPINE BESYLATE 5 MG PO TABS: 5.0000 mg | ORAL_TABLET | Freq: Every day | ORAL | 1 refills | Status: DC

## 2019-06-27 MED ORDER — ALBUTEROL SULFATE HFA 108 (90 BASE) MCG/ACT IN AERS: 1.0000 | INHALATION_SPRAY | RESPIRATORY_TRACT | 3 refills | Status: DC | PRN

## 2019-06-27 MED ORDER — FLUTICASONE PROPIONATE 50 MCG/ACT NA SUSP: 2.0000 | Freq: Every day | NASAL | 1 refills | Status: DC

## 2019-06-27 MED ORDER — METOPROLOL TARTRATE 25 MG PO TABS: 12.5000 mg | ORAL_TABLET | Freq: Two times a day (BID) | ORAL | 3 refills | Status: DC

## 2019-06-27 MED ORDER — ALBUTEROL SULFATE (2.5 MG/3ML) 0.083% IN NEBU: 2.5000 mg | INHALATION_SOLUTION | Freq: Four times a day (QID) | RESPIRATORY_TRACT | 1 refills | Status: DC | PRN

## 2019-06-27 MED ORDER — PREDNISONE 20 MG PO TABS: 20.0000 mg | ORAL_TABLET | Freq: Every day | ORAL | 0 refills | Status: DC

## 2019-06-27 MED ORDER — NOVOLOG FLEXPEN 100 UNIT/ML ~~LOC~~ SOPN: 5.0000 [IU] | PEN_INJECTOR | Freq: Three times a day (TID) | SUBCUTANEOUS | 1 refills | Status: DC

## 2019-06-27 MED ORDER — LORATADINE 10 MG PO TABS: 10.0000 mg | ORAL_TABLET | Freq: Every day | ORAL | 2 refills | Status: DC | PRN

## 2019-06-27 MED ORDER — ARNUITY ELLIPTA 100 MCG/ACT IN AEPB: 1.0000 | INHALATION_SPRAY | Freq: Every day | RESPIRATORY_TRACT | 3 refills | Status: DC

## 2019-06-27 MED ORDER — PRAVASTATIN SODIUM 20 MG PO TABS: 20.0000 mg | ORAL_TABLET | Freq: Every day | ORAL | 3 refills | Status: DC

## 2019-06-27 NOTE — Progress Notes (Signed)
Subjective:    Patient ID: Joyce Gilmore, female    DOB: 10/31/1934, 85 y.o.   MRN: [id number]  Patient presents for Follow-up (is not fasting)  Patient here to follow-up on medical problems.  Medications reviewed.  She is here with her granddaughter as well as interpreter.  Major complaints today include allergy symptoms.  Has itchy eyes runny nose drainage down her throat.  No facial pain or nasal discomfort.  No fever.  She does get occasional cough with clear sputum with the drainage.  She has not had to use her rescue inhaler.  Diabetes mellitus did not bring her meter with her today states that her blood sugars have been in the low 80s of the 140s but they cannot tell me really what time of day this occurs.  She is currently taking Lantus 40 units at bedtime and 6 units with meals  Chronic back pain with degenerative disc disease mild spinal stenosis.  The past 2 days she has had pain that radiates from her buttocks down to her left leg she has been using a topical cream which works best.  No change in bowel or bladder he also is trying to do some exercises but the orthopedic doctor gave her.    Review Of Systems:  GEN- denies fatigue, fever, weight loss,weakness, recent illness HEENT- denies eye drainage, change in vision, nasal discharge, CVS- denies chest pain, palpitations RESP- denies SOB, cough, wheeze ABD- denies N/V, change in stools, abd pain GU- denies dysuria, hematuria, dribbling, incontinence MSK-+ joint pain, muscle aches, injury Neuro- denies headache, dizziness, syncope, seizure activity       Objective:    BP (!) 142/74   Pulse 60   Temp 98 F (36.7 C) (Temporal)   Resp 16   Ht 4\' 8"  (1.422 m)   Wt 158 lb (71.7 kg)   SpO2 96%   BMI 35.42 kg/m  GEN- NAD, alert and oriented x3,well appearing  HEENT- PERRL, EOMI, non injected sclera, pink conjunctiva, MMM, oropharynx clear, enlarged right turbinate, Tm clear no effusion, no sinus tenderess Neck-  Supple, no thyromegaly, no LAD  CVS- RRR, no murmur RESP-CTAB ABD-NABS,soft,NT,ND MSK- mild TTP left paraspinals, fair ROM, +SLR left side, fair ROM HIPS/KNEES NEURO- tone in tact, sensation grossly in tact, motor equal bilat LE, antalgic gait  EXT- No edema Pulses- Radial, DP- 2+        Assessment & Plan:      Problem List Items Addressed This Visit      Unprioritized   Chronic diastolic heart failure (HCC)    Currently compensated.  She is taking diuretic      Chronic kidney disease   DDD (degenerative disc disease), lumbar    Chronic back pain.  She has not seen to have any improvement using tramadol or hydrocodone she states that she does not want to use the cream.  We will give her low-dose 20 mg of prednisone to help with the inflammation around the sciatic nerve.  This is chronic back pain for her she is not a good surgical candidate      Relevant Medications   predniSONE (DELTASONE) 20 MG tablet   Essential hypertension    Blood pressure is fair.  No change to her current medications.  We will send her for refills.  Of note she will be going back to [place]      Relevant Orders   CBC with Differential/Platelet   Comprehensive metabolic panel   Lipid panel  HLD (hyperlipidemia)   Relevant Orders   Lipid panel   Seasonal allergies - Primary   Type 2 diabetes mellitus with diabetic chronic kidney disease ([place])    Goal is A1c less than 8% her last 1 was 8.2.  She did not bring her meter with her today as a difficulty verifying her blood sugars.  We will draw labs today follow-up renal function.      Relevant Orders   Hemoglobin A1c      Note: This dictation was prepared with Dragon dictation along with smaller phrase technology. Any transcriptional errors that result from this process are unintentional.

## 2019-06-27 NOTE — Assessment & Plan Note (Signed)
Chronic back pain.  She has not seen to have any improvement using tramadol or hydrocodone she states that she does not want to use the cream.  We will give her low-dose 20 mg of prednisone to help with the inflammation around the sciatic nerve.  This is chronic back pain for her she is not a good surgical candidate

## 2019-06-27 NOTE — Assessment & Plan Note (Signed)
Blood pressure is fair.  No change to her current medications.  We will send her for refills.  Of note she will be going back to [place]

## 2019-06-27 NOTE — Assessment & Plan Note (Signed)
Currently compensated.  She is taking diuretic

## 2019-06-27 NOTE — Assessment & Plan Note (Signed)
Goal is A1c less than 8% her last 1 was 8.2.  She did not bring her meter with her today as a difficulty verifying her blood sugars.  We will draw labs today follow-up renal function.

## 2019-06-27 NOTE — Patient Instructions (Signed)
Take claritin for allergies  Use flonase nasal spray Continue current medications Prednisone for inflammation around nerve in back F/U 4 months

## 2019-06-28 LAB — COMPREHENSIVE METABOLIC PANEL
AG Ratio: 1.4 (calc) (ref 1.0–2.5)
ALT: 13 U/L (ref 6–29)
AST: 17 U/L (ref 10–35)
Albumin: 4.1 g/dL (ref 3.6–5.1)
Alkaline phosphatase (APISO): 76 U/L (ref 37–153)
BUN: 16 mg/dL (ref 7–25)
CO2: 25 mmol/L (ref 20–32)
Calcium: 9.5 mg/dL (ref 8.6–10.4)
Chloride: 98 mmol/L (ref 98–110)
Creat: 0.83 mg/dL (ref 0.60–0.88)
Globulin: 2.9 g/dL (calc) (ref 1.9–3.7)
Glucose, Bld: 91 mg/dL (ref 65–99)
Potassium: 4.5 mmol/L (ref 3.5–5.3)
Sodium: 131 mmol/L — ABNORMAL LOW (ref 135–146)
Total Bilirubin: 0.3 mg/dL (ref 0.2–1.2)
Total Protein: 7 g/dL (ref 6.1–8.1)

## 2019-06-28 LAB — HEMOGLOBIN A1C
Hgb A1c MFr Bld: 6.6 % of total Hgb — ABNORMAL HIGH (ref <5.7)
Mean Plasma Glucose: 143 (calc)
eAG (mmol/L): 7.9 (calc)

## 2019-06-28 LAB — CBC WITH DIFFERENTIAL/PLATELET
Absolute Monocytes: 768 cells/uL (ref 200–950)
Basophils Absolute: 48 cells/uL (ref 0–200)
Basophils Relative: 0.7 %
Eosinophils Absolute: 231 cells/uL (ref 15–500)
Eosinophils Relative: 3.4 %
HCT: 36.4 % (ref 35.0–45.0)
Hemoglobin: 11.8 g/dL (ref 11.7–15.5)
Lymphs Abs: 2196 cells/uL (ref 850–3900)
MCH: 26.6 pg — ABNORMAL LOW (ref 27.0–33.0)
MCHC: 32.4 g/dL (ref 32.0–36.0)
MCV: 82.2 fL (ref 80.0–100.0)
MPV: 10.4 fL (ref 7.5–12.5)
Monocytes Relative: 11.3 %
Neutro Abs: 3556 cells/uL (ref 1500–7800)
Neutrophils Relative %: 52.3 %
Platelets: 214 10*3/uL (ref 140–400)
RBC: 4.43 10*6/uL (ref 3.80–5.10)
RDW: 14.6 % (ref 11.0–15.0)
Total Lymphocyte: 32.3 %
WBC: 6.8 10*3/uL (ref 3.8–10.8)

## 2019-06-28 LAB — LIPID PANEL
Cholesterol: 148 mg/dL (ref <200)
HDL: 57 mg/dL (ref >=50)
LDL Cholesterol (Calc): 67 mg/dL (calc)
Non-HDL Cholesterol (Calc): 91 mg/dL (calc) (ref <130)
Total CHOL/HDL Ratio: 2.6 (calc) (ref <5.0)
Triglycerides: 154 mg/dL — ABNORMAL HIGH (ref <150)

## 2019-07-01 ENCOUNTER — Encounter: Payer: Self-pay | Admitting: *Deleted

## 2019-07-09 ENCOUNTER — Emergency Department (HOSPITAL_COMMUNITY)
Admission: EM | Admit: 2019-07-09 | Discharge: 2019-07-09 | Disposition: A | Discharge status: Home / Self Care | Payer: Medicaid Other | Attending: Emergency Medicine | Admitting: Emergency Medicine

## 2019-07-09 ENCOUNTER — Emergency Department (HOSPITAL_COMMUNITY): Payer: Medicaid Other

## 2019-07-09 ENCOUNTER — Encounter (HOSPITAL_COMMUNITY): Payer: Self-pay | Admitting: Emergency Medicine

## 2019-07-09 ENCOUNTER — Other Ambulatory Visit: Payer: Self-pay

## 2019-07-09 DIAGNOSIS — M5442 Lumbago with sciatica, left side: Secondary | ICD-10-CM | POA: Insufficient documentation

## 2019-07-09 DIAGNOSIS — Z794 Long term (current) use of insulin: Secondary | ICD-10-CM | POA: Insufficient documentation

## 2019-07-09 DIAGNOSIS — E119 Type 2 diabetes mellitus without complications: Secondary | ICD-10-CM | POA: Insufficient documentation

## 2019-07-09 DIAGNOSIS — Z96641 Presence of right artificial hip joint: Secondary | ICD-10-CM | POA: Diagnosis not present

## 2019-07-09 DIAGNOSIS — M545 Low back pain: Secondary | ICD-10-CM | POA: Diagnosis present

## 2019-07-09 DIAGNOSIS — Z7982 Long term (current) use of aspirin: Secondary | ICD-10-CM | POA: Diagnosis not present

## 2019-07-09 DIAGNOSIS — G8929 Other chronic pain: Secondary | ICD-10-CM | POA: Diagnosis not present

## 2019-07-09 DIAGNOSIS — Z79899 Other long term (current) drug therapy: Secondary | ICD-10-CM | POA: Diagnosis not present

## 2019-07-09 DIAGNOSIS — I110 Hypertensive heart disease with heart failure: Secondary | ICD-10-CM | POA: Insufficient documentation

## 2019-07-09 DIAGNOSIS — M1612 Unilateral primary osteoarthritis, left hip: Secondary | ICD-10-CM | POA: Diagnosis not present

## 2019-07-09 DIAGNOSIS — I5032 Chronic diastolic (congestive) heart failure: Secondary | ICD-10-CM | POA: Diagnosis not present

## 2019-07-09 MED ORDER — LIDOCAINE 5 % EX PTCH: 1.0000 | MEDICATED_PATCH | CUTANEOUS | 0 refills | Status: DC

## 2019-07-09 MED ORDER — HYDROCODONE-ACETAMINOPHEN 5-325 MG PO TABS: 1.0000 | ORAL_TABLET | ORAL | 0 refills | Status: DC | PRN

## 2019-07-09 MED ORDER — HYDROCODONE-ACETAMINOPHEN 5-325 MG PO TABS
1.0000 | ORAL_TABLET | Freq: Once | ORAL | Status: AC
  Administered 2019-07-09: 1 via ORAL
  Filled 2019-07-09: qty 1

## 2019-07-09 NOTE — ED Triage Notes (Signed)
Spanish interpretor used to obtain history.  pts daughter reports hx of sciatica which patient was treated for by her doctor. Continues to have ongoing L leg pain and has no more medication to take. Denies any recent falls, difficulty urinating or back pain.

## 2019-07-09 NOTE — ED Provider Notes (Signed)
[facility] EMERGENCY DEPARTMENT Provider Note   CSN: [id number] Arrival date & time: 07/09/19  2947     History Chief Complaint  Patient presents with  . Leg Pain    Joyce Gilmore is a 85 y.o. female with past medical history significant for CHF, CKD, chronic back pain who presents for evaluation of back pain. Followed by Dr. Ninfa Linden for chronic back pain. Intermittently get steroid injections to lumbar area and PRN oxy for pain. Is currently out of pain medication at home last seen by Ortho in Nov 2020. Prior surgical intervention by Dr. Ernestina Patches.  States pain feels similar to her sciatica.  Patient's daughter states she would like x-ray done of her hip to ensure this is not the cause.  No unilateral leg swelling, redness or warmth.  No recent falls or injuries.  No paresthesias.  Pain starts at her lumbar area and radiates down her posterior gluteus and terminates at her right popliteal.  Pain in 9/10.  Has not taken anything at home.  Denies additional aggravating relieving factors.  History obtained from patient, patient's daughter, past medical records.  Medical Spanish interpreter was used.  HPI     Past Medical History:  Diagnosis Date  . Arthritis   . CHF (congestive heart failure) (HCC)    diastolic  . Chronic kidney disease    hx of kidney stones,   . Depression    hx of   . Diabetes mellitus without complication ([place])   . Ganglion, left ankle and foot   . Hearing loss of both ears   . Hypertension   . MRSA infection    Oct 13 - Nov 13  . Osteoporosis   . Tinnitus of both ears     Patient Active Problem List   Diagnosis Date Noted  . Chronic kidney disease 05/07/2018  . Lactic acidosis 05/07/2018  . GERD (gastroesophageal reflux disease) 03/10/2017  . Uses hearing aid 09/12/2016  . Asthmatic bronchitis 03/11/2016  . Chronic diastolic heart failure ([place]) 10/12/2015  . SOB (shortness of breath) 10/12/2015  . Shortness of breath 10/12/2015    . Seasonal allergies 11/21/2014  . DDD (degenerative disc disease), lumbar 10/11/2014  . H/O compression fracture of spine 10/11/2014  . Pulmonary hypertension ([place])   . Hepatic cirrhosis ([place]) 09/24/2014  . SIADH (syndrome of inappropriate ADH production) ([place]) 06/13/2014  . Insomnia 12/28/2013  . Anemia 07/06/2013  . HLD (hyperlipidemia) 06/27/2013  . Osteoporosis 07/26/2012  . Hyponatremia 05/16/2011  . Type 2 diabetes mellitus with diabetic chronic kidney disease ([place]) 08/17/2006  . Essential hypertension 08/17/2006  . DIVERTICULOSIS, COLON 08/17/2006    Past Surgical History:  Procedure Laterality Date  . BACK SURGERY     history restored after error with record merge      . INCISION AND DRAINAGE HIP  12/22/2011   Procedure: IRRIGATION AND DEBRIDEMENT HIP;  Surgeon: Mcarthur Rossetti, MD;  Location: [place];  Service: Orthopedics;  Laterality: Right;  Irrigation and debridement right hip  . INCISION AND DRAINAGE HIP  12/27/2011   Procedure: IRRIGATION AND DEBRIDEMENT HIP;  Surgeon: Mcarthur Rossetti, MD;  Location: [place];  Service: Orthopedics;  Laterality: Right;  Repeat irrigation and debridement  . JOINT REPLACEMENT     bilateral knee, right hip   . TOTAL HIP REVISION  12/05/2011   Procedure: TOTAL HIP REVISION;  Surgeon: Mcarthur Rossetti, MD;  Location: [facility];  Service: Orthopedics;  Laterality: Right;  Right Hip Revision Arthroplasty  to Total Hip, Excision of Old Implant     OB History   No obstetric history on file.     Family History  Problem Relation Age of Onset  . Hypertension Mother   . Colon cancer Neg Hx   . Breast cancer Neg Hx     Social History   Tobacco Use  . Smoking status: Never Smoker  . Smokeless tobacco: Never Used  Substance Use Topics  . Alcohol use: No    Alcohol/week: 0.0 standard drinks  . Drug use: No    Home Medications Prior to Admission medications   Medication Sig Start Date End Date Taking? Authorizing  Provider  Accu-Chek Softclix Lancets lancets USE AS DIRECTED 05/06/19   Alycia Rossetti, MD  Acetaminophen (TYLENOL PO) Take 1,000 tablets by mouth every 6 (six) hours as needed (pain/headache).     [provider]  albuterol (PROVENTIL) (2.5 MG/3ML) 0.083% nebulizer solution Take 3 mLs (2.5 mg total) by nebulization every 6 (six) hours as needed for wheezing or shortness of breath. 06/27/19   [place], Modena Nunnery, MD  albuterol (VENTOLIN HFA) 108 (90 Base) MCG/ACT inhaler Inhale 1-2 puffs into the lungs every 4 (four) hours as needed for wheezing or shortness of breath. 06/27/19   [place], Modena Nunnery, MD  amLODipine (NORVASC) 5 MG tablet Take 1 tablet (5 mg total) by mouth daily. 06/27/19   Alycia Rossetti, MD  aspirin EC 81 MG tablet Take 81 mg by mouth daily.    [provider]  blood glucose meter kit and supplies KIT Dispense based on patient and insurance preference. Use up to four times daily as directed. (FOR ICD-9 250.00, 250.01). 04/25/14   Dena Billet B, PA-C  calcium carbonate (OSCAL) 1500 (600 Ca) MG TABS tablet Take 600 mg of elemental calcium by mouth 2 (two) times daily with a meal.    [provider]  fluticasone (FLONASE) 50 MCG/ACT nasal spray SHAKE LIQUID AND USE 2 SPRAYS IN EACH NOSTRIL DAILY 06/27/19   [place], Modena Nunnery, MD  Fluticasone Furoate (ARNUITY ELLIPTA) 100 MCG/ACT AEPB Inhale 1 puff into the lungs daily. 06/27/19   [place], Modena Nunnery, MD  furosemide (LASIX) 20 MG tablet Take 1 tablet (20 mg total) by mouth daily. 06/27/19   [place], Modena Nunnery, MD  glucose blood (ACCU-CHEK AVIVA PLUS) test strip USE TO CHECK BLOOD SUGAR FOUR TIMES DAILY AS DIRECTED 12/10/18   Alycia Rossetti, MD  HYDROcodone-acetaminophen (NORCO/VICODIN) 5-325 MG tablet Take 1 tablet by mouth every 4 (four) hours as needed. 07/09/19   Ivana Nicastro A, PA-C  insulin aspart (NOVOLOG FLEXPEN) 100 UNIT/ML FlexPen Inject 5-15 Units into the skin 3 (three) times daily with meals. Per sliding scale  06/27/19   [place], Modena Nunnery, MD  insulin glargine (LANTUS SOLOSTAR) 100 UNIT/ML Solostar Pen INJECT  40 UNITS UNDER THE SKIN AT BEDTIME 06/27/19   [place], Modena Nunnery, MD  Insulin Pen Needle (B-D ULTRAFINE III SHORT PEN) 31G X 8 MM MISC USE AS DIRECTED 03/22/19   Alycia Rossetti, MD  lansoprazole (PREVACID) 30 MG capsule Take 1 capsule (30 mg total) by mouth daily at 12 noon. 06/27/19   , Modena Nunnery, MD  lidocaine (LIDODERM) 5 % Place 1 patch onto the skin daily. Remove & Discard patch within 12 hours or as directed by MD 07/09/19   Cornelious Bartolucci A, PA-C  lisinopril (ZESTRIL) 10 MG tablet Take 1 tablet (10 mg total) by mouth daily. 06/27/19   Alycia Rossetti,  MD  loratadine (CLARITIN) 10 MG tablet Take 1 tablet (10 mg total) by mouth daily as needed for allergies. 06/27/19   Alycia Rossetti, MD  metoprolol tartrate (LOPRESSOR) 25 MG tablet Take 0.5 tablets (12.5 mg total) by mouth 2 (two) times daily. 06/27/19   Alycia Rossetti, MD  polyvinyl alcohol (ARTIFICIAL TEARS) 1.4 % ophthalmic solution Place 1 drop into both eyes as needed for dry eyes. 10/12/17   Alycia Rossetti, MD  pravastatin (PRAVACHOL) 20 MG tablet Take 1 tablet (20 mg total) by mouth daily. 06/27/19   [place], Modena Nunnery, MD  predniSONE (DELTASONE) 20 MG tablet Take 1 tablet (20 mg total) by mouth daily with breakfast. For back pain 06/27/19   Alycia Rossetti, MD  traMADol (ULTRAM) 50 MG tablet Take 1 tablet (50 mg total) by mouth 2 (two) times daily as needed. Chronic pain 03/22/19   Alycia Rossetti, MD    Allergies    Bactrim [sulfamethoxazole-trimethoprim], Rifampin, and Vancomycin  Review of Systems   Review of Systems  Constitutional: Negative.   HENT: Negative.   Respiratory: Negative.   Cardiovascular: Negative.   Gastrointestinal: Negative.   Genitourinary: Negative.   Musculoskeletal: Positive for back pain.  Skin: Negative.   Neurological: Negative.   All other systems reviewed and are negative.   Physical  Exam Updated Vital Signs BP (!) 151/62 (BP Location: Right Arm)   Pulse 70   Temp 98.5 F (36.9 C) (Oral)   Resp 14   SpO2 97%   Physical Exam  Physical Exam  Constitutional: Pt appears well-developed and well-nourished. No distress.  HENT:  Head: Normocephalic and atraumatic.  Mouth/Throat: Oropharynx is clear and moist. No oropharyngeal exudate.  Eyes: Conjunctivae are normal.  Neck: Normal range of motion. Neck supple.  Full ROM without pain  Cardiovascular: Normal rate, regular rhythm and intact distal pulses.   Pulmonary/Chest: Effort normal and breath sounds normal. No respiratory distress. Pt has no wheezes.  Abdominal: Soft. Pt exhibits no distension. There is no tenderness, rebound or guarding. No abd bruit or pulsatile mass Musculoskeletal:  Full range of motion of the T-spine and L-spine with flexion, hyperextension, and lateral flexion. No midline tenderness or stepoffs. No tenderness to palpation of the spinous processes of the T-spine or L-spine. Moderate  tenderness to palpation of the paraspinous muscles of the LEFT L-spine. Positive straight leg raise on LEFT.  Tenderness over left piriformis.  Compartments soft.  No tenderness to distal extremities or popliteal fossa.  No edema, erythema or warmth. Lymphadenopathy:    Pt has no cervical adenopathy.  Neurological: Pt is alert. Pt has normal reflexes.  Reflex Scores:      Bicep reflexes are 2+ on the right side and 2+ on the left side.      Brachioradialis reflexes are 2+ on the right side and 2+ on the left side.      Patellar reflexes are 2+ on the right side and 2+ on the left side.      Achilles reflexes are 2+ on the right side and 2+ on the left side. Speech is clear and goal oriented, follows commands Normal 5/5 strength in upper and lower extremities bilaterally including dorsiflexion and plantar flexion, strong and equal grip strength Sensation normal to light and sharp touch Moves extremities without  ataxia, coordination intact Normal gait Normal balance No Clonus Skin: Skin is warm and dry. No rash noted or lesions noted. Pt is not diaphoretic. No erythema, ecchymosis,edema or warmth.  Psychiatric: Pt has a normal mood and affect. Behavior is normal.  Nursing note and vitals reviewed. ED Results / Procedures / Treatments   Labs (all labs ordered are listed, but only abnormal results are displayed) Labs Reviewed - No data to display  EKG None  Radiology DG Hip Unilat W or Wo Pelvis 2-3 Views Left  Result Date: 07/09/2019 CLINICAL DATA:  Low back and hip pain. EXAM: DG HIP (WITH OR WITHOUT PELVIS) 2-3V LEFT COMPARISON:  11/22/2018 FINDINGS: No acute fracture.  No bone lesion. Mild medial left hip joint space narrowing similar to the prior exam. No other arthropathic change. Right hip total arthroplasty is well seated and aligned. There old fractures of the right superior and inferior pubic rami. SI joints and symphysis pubis are normally spaced and aligned. Skeletal structures are demineralized. There are arterial atherosclerotic calcifications. IMPRESSION: 1. No acute fracture or acute finding. 2. Mild medial left hip joint space narrowing. No other arthropathic change. Electronically Signed   By: Lajean Manes M.D.   On: 07/09/2019 10:54    Procedures Procedures (including critical care time)  Medications Ordered in ED Medications  HYDROcodone-acetaminophen (NORCO/VICODIN) 5-325 MG per tablet 1 tablet (1 tablet Oral Given 07/09/19 1004)    ED Course  I have reviewed the triage vital signs and the nursing notes.  Pertinent labs & imaging results that were available during my care of the patient were reviewed by me and considered in my medical decision making (see chart for details).  84 year old female presents for evaluation of acute on chronic back pain.  Followed by Dr. Ninfa Linden.  No recent injury or trauma.  Has history of severe DDD.  Surgical intervention in January with  Dr. Lucia Gaskins.  Pain feels similar to her chronic back pain.  Has been out of her home pain medication.  No unilateral leg swelling, redness or warmth.  Equal pulses bilaterally.  Neurovascular intact.  Neuromusculoskeletal exam aside from some moderate tenderness over her right piriformis.  No overlying skin changes.  Low suspicion for vascular occlusion, bacterial infectious process.  Patient's daughter is pretty adamant about x-ray of left hip.  Will obtain x-ray as well as give her her home dose of pain medication.  Discussed with attending, Dr. Sherry Ruffing who agrees with plan and disposition.  Plain film x-ray does not show any acute fracture or dislocation.  High suspicion for acute on chronic back pain.  Will DC home with short course of pain medication and lidocaine patches.  She is to follow-up with orthopedics for reevaluation.  She may return for new worsening symptoms.  I have low suspicion for septic joint, cauda equina, discitis, osteomyelitis, transverse myelitis, psoas abscess, vascular occlusion.  No evidence of DVT on exam.  The patient has been appropriately medically screened and/or stabilized in the ED. I have low suspicion for any other emergent medical condition which would require further screening, evaluation or treatment in the ED or require inpatient management.  Patient is hemodynamically stable and in no acute distress.  Patient able to ambulate in department prior to ED.  Evaluation does not show acute pathology that would require ongoing or additional emergent interventions while in the emergency department or further inpatient treatment.  I have discussed the diagnosis with the patient and answered all questions.  Pain is been managed while in the emergency department and patient has no further complaints prior to discharge.  Patient is comfortable with plan discussed in room and is stable for discharge at this time.  I have discussed strict return precautions for returning to the  emergency department.  Patient was encouraged to follow-up with PCP/specialist refer to at discharge.    MDM Rules/Calculators/A&P                       Final Clinical Impression(s) / ED Diagnoses Final diagnoses:  Chronic left-sided low back pain with left-sided sciatica    Rx / DC Orders ED Discharge Orders         Ordered    HYDROcodone-acetaminophen (NORCO/VICODIN) 5-325 MG tablet  Every 4 hours PRN     07/09/19 1128    lidocaine (LIDODERM) 5 %  Every 24 hours     07/09/19 1128           Arnav Cregg A, PA-C 07/09/19 1129    Tegeler, Gwenyth Allegra, MD 07/09/19 1614

## 2019-07-09 NOTE — Discharge Instructions (Signed)
Haga un seguimiento con el Dr. Donalda Ewings ortopedista. Tome los analgsicos segn lo prescrito. Esto puede provocarle sueo. Solo tome lo necesario. No tome ms tylenol cuando est tomando este medicamento.

## 2019-07-13 ENCOUNTER — Other Ambulatory Visit: Payer: Self-pay

## 2019-07-13 ENCOUNTER — Encounter: Payer: Self-pay | Admitting: Orthopaedic Surgery

## 2019-07-13 ENCOUNTER — Ambulatory Visit (INDEPENDENT_AMBULATORY_CARE_PROVIDER_SITE_OTHER): Payer: Medicaid Other | Admitting: Orthopaedic Surgery

## 2019-07-13 DIAGNOSIS — M25552 Pain in left hip: Secondary | ICD-10-CM

## 2019-07-13 DIAGNOSIS — M5442 Lumbago with sciatica, left side: Secondary | ICD-10-CM

## 2019-07-13 DIAGNOSIS — G8929 Other chronic pain: Secondary | ICD-10-CM

## 2019-07-13 MED ORDER — HYDROCODONE-ACETAMINOPHEN 5-325 MG PO TABS: 1.0000 | ORAL_TABLET | Freq: Four times a day (QID) | ORAL | 0 refills | Status: DC | PRN

## 2019-07-13 NOTE — Progress Notes (Signed)
Office Visit Note   Patient: Joyce Gilmore           Date of Birth: Feb 16, 1935           MRN: [id number] Visit Date: 07/13/2019              Requested by: Alycia Rossetti, MD [address] [place],  [place] [address] PCP: Alycia Rossetti, MD   Assessment & Plan: Visit Diagnoses:  1. Pain in left hip   2. Chronic bilateral low back pain with left-sided sciatica     Plan: The MRI of her lumbar spine in 2019 did show significant paracentral disc protrusion at L4-L5 the left side.  I think this is where she is most symptomatic and given the fact that she had a good response from an injection by Dr. Ernestina Patches last year, it is worth setting her up for repeat ESI at L4-L5 to the left side.  I will refill the hydrocodone.  If this works still be great if not we may end up referring her for a spine surgical evaluation.  All questions and concerns were answered and addressed.  When she has an intervention with Dr. Ernestina Patches he can get her back to [place] in follow-up.  Follow-Up Instructions: No follow-ups on file.   Orders:  No orders of the defined types were placed in this encounter.  Meds ordered this encounter  Medications  . HYDROcodone-acetaminophen (NORCO/VICODIN) 5-325 MG tablet    Sig: Take 1 tablet by mouth every 6 (six) hours as needed.    Dispense:  40 tablet    Refill:  0      Procedures: No procedures performed   Clinical Data: No additional findings.   Subjective: Chief Complaint  Patient presents with  . Left Hip - Follow-up, Pain  The patient is well-known to [place].  She is a 85 year old female with significant left-sided sciatic and radicular symptoms going down her left leg.  She has known significant disc protrusion at L4-L5.  She last had an epidural steroid injection by Dr. Ernestina Patches to the left-sided L4-L5 in October 2020.  That did help.  She ambulates with a cane.  She woke up this past weekend with severe pain in her lower back rating down her left leg.  She was  crying in pain and she she was taken to emergency room.  They x-rayed her hip and pelvis.  There was no acute findings from the x-rays and she was given hydrocodone and being seen in the office today.  There is an interpreter as well.  Her granddaughter who works in the office also interprets.  She does look much better today than she did over the weekend.  She still reports the same pain but is not as debilitating as it was this past weekend.  There is been no change in bowel bladder function.  HPI  Review of Systems There is currently no headache, chest pain, shortness of breath, fever, chills, nausea, vomiting  Objective: Vital Signs: There were no vitals taken for this visit.  Physical Exam Ortho Exam She is alert and oriented and in no acute distress.  She does have fluid range of motion of her left hip with no pain in the hip at all.  Her pain seems to radiate from the lower lumbar spine down into the sciatic region behind her knee and down to her foot.  She does have a positive straight leg raise on that side with no  gross weakness. Specialty Comments:  No specialty comments available.  Imaging: No results found. X-rays of the pelvis and left hip show no acute findings.  Hip joint spaces well-maintained.  She has a total hip arthroplasty on the right side.  PMFS History: Patient Active Problem List   Diagnosis Date Noted  . Chronic kidney disease 05/07/2018  . Lactic acidosis 05/07/2018  . GERD (gastroesophageal reflux disease) 03/10/2017  . Uses hearing aid 09/12/2016  . Asthmatic bronchitis 03/11/2016  . Chronic diastolic heart failure ([place]) 10/12/2015  . SOB (shortness of breath) 10/12/2015  . Shortness of breath 10/12/2015  . Seasonal allergies 11/21/2014  . DDD (degenerative disc disease), lumbar 10/11/2014  . H/O compression fracture of spine 10/11/2014  . Pulmonary hypertension ([place])   . Hepatic cirrhosis ([place]) 09/24/2014  . SIADH (syndrome of inappropriate ADH  production) ([place]) 06/13/2014  . Insomnia 12/28/2013  . Anemia 07/06/2013  . HLD (hyperlipidemia) 06/27/2013  . Osteoporosis 07/26/2012  . Hyponatremia 05/16/2011  . Type 2 diabetes mellitus with diabetic chronic kidney disease ([place]) 08/17/2006  . Essential hypertension 08/17/2006  . DIVERTICULOSIS, COLON 08/17/2006   Past Medical History:  Diagnosis Date  . Arthritis   . CHF (congestive heart failure) (HCC)    diastolic  . Chronic kidney disease    hx of kidney stones,   . Depression    hx of   . Diabetes mellitus without complication ([place])   . Ganglion, left ankle and foot   . Hearing loss of both ears   . Hypertension   . MRSA infection    Oct 13 - Nov 13  . Osteoporosis   . Tinnitus of both ears     Family History  Problem Relation Age of Onset  . Hypertension Mother   . Colon cancer Neg Hx   . Breast cancer Neg Hx     Past Surgical History:  Procedure Laterality Date  . BACK SURGERY     history restored after error with record merge      . INCISION AND DRAINAGE HIP  12/22/2011   Procedure: IRRIGATION AND DEBRIDEMENT HIP;  Surgeon: Mcarthur Rossetti, MD;  Location: [place];  Service: Orthopedics;  Laterality: Right;  Irrigation and debridement right hip  . INCISION AND DRAINAGE HIP  12/27/2011   Procedure: IRRIGATION AND DEBRIDEMENT HIP;  Surgeon: Mcarthur Rossetti, MD;  Location: [place];  Service: Orthopedics;  Laterality: Right;  Repeat irrigation and debridement  . JOINT REPLACEMENT     bilateral knee, right hip   . TOTAL HIP REVISION  12/05/2011   Procedure: TOTAL HIP REVISION;  Surgeon: Mcarthur Rossetti, MD;  Location: [facility];  Service: Orthopedics;  Laterality: Right;  Right Hip Revision Arthroplasty to Total Hip, Excision of Old Implant   Social History   Occupational History  . Not on file  Tobacco Use  . Smoking status: Never Smoker  . Smokeless tobacco: Never Used  Substance and Sexual Activity  . Alcohol use: No    Alcohol/week: 0.0  standard drinks  . Drug use: No  . Sexual activity: Not Currently

## 2019-07-15 ENCOUNTER — Other Ambulatory Visit: Payer: Self-pay

## 2019-07-15 DIAGNOSIS — G8929 Other chronic pain: Secondary | ICD-10-CM

## 2019-07-21 ENCOUNTER — Encounter: Payer: Self-pay | Admitting: Physical Medicine and Rehabilitation

## 2019-07-21 ENCOUNTER — Other Ambulatory Visit: Payer: Self-pay

## 2019-07-21 ENCOUNTER — Ambulatory Visit (INDEPENDENT_AMBULATORY_CARE_PROVIDER_SITE_OTHER): Payer: Medicaid Other | Admitting: Physical Medicine and Rehabilitation

## 2019-07-21 ENCOUNTER — Ambulatory Visit: Payer: Self-pay

## 2019-07-21 VITALS — BP 174/75 | HR 74

## 2019-07-21 DIAGNOSIS — M48062 Spinal stenosis, lumbar region with neurogenic claudication: Secondary | ICD-10-CM

## 2019-07-21 DIAGNOSIS — M5416 Radiculopathy, lumbar region: Secondary | ICD-10-CM | POA: Diagnosis not present

## 2019-07-21 MED ORDER — METHYLPREDNISOLONE ACETATE 80 MG/ML IJ SUSP
80.0000 mg | Freq: Once | INTRAMUSCULAR | Status: AC
  Administered 2019-07-21: 80 mg

## 2019-07-21 NOTE — Progress Notes (Signed)
MACII ARD - 85 y.o. female MRN [id number]  Date of birth: 15-May-1934  Office Visit Note: Visit Date: 07/21/2019 PCP: Alycia Rossetti, MD Referred by: Alycia Rossetti, MD  Subjective: Chief Complaint  Patient presents with  . Lower Back - Pain   HPI:  Joyce Gilmore is a 85 y.o. female who comes in today for planned Left L4-L5 lumbar transforaminal epidural steroid injection with fluoroscopic guidance.  The patient has failed conservative care including home exercise, medications, time and activity modification.  This injection will be diagnostic and hopefully therapeutic.  Please see requesting physician notes for further details and justification.   Patient is with family member and interpreter today. She is having left radicular leg pain consistent with her severe lumbar stenosis at L4-5. Prior injections were helpful but did not last very long. She reports that she really does not like the injections at all and she does want to have one today but does not want to have any further injections. She'll continue to follow-up with Dr. Erlinda Hong.  ROS Otherwise per HPI.  Assessment & Plan: Visit Diagnoses:  1. Spinal stenosis of lumbar region with neurogenic claudication   2. Lumbar radiculopathy     Plan: No additional findings.   Meds & Orders:  Meds ordered this encounter  Medications  . methylPREDNISolone acetate (DEPO-MEDROL) injection 80 mg    Orders Placed This Encounter  Procedures  . XR C-ARM NO REPORT  . Epidural Steroid injection    Follow-up: Return for visit to requesting physician as needed.   Procedures: No procedures performed  Lumbosacral Transforaminal Epidural Steroid Injection - Sub-Pedicular Approach with Fluoroscopic Guidance  Patient: Joyce Gilmore      Date of Birth: 1934/03/30 MRN: [id number] PCP: Alycia Rossetti, MD      Visit Date: 07/21/2019   Universal Protocol:    Date/Time: 07/21/2019  Consent Given By: the patient  Position:  PRONE  Additional Comments: Vital signs were monitored before and after the procedure. Patient was prepped and draped in the usual sterile fashion. The correct patient, procedure, and site was verified.   Injection Procedure Details:  Procedure Site One Meds Administered:  Meds ordered this encounter  Medications  . methylPREDNISolone acetate (DEPO-MEDROL) injection 80 mg    Laterality: Left  Location/Site:  L4-L5  Needle size: 22 G  Needle type: Spinal  Needle Placement: Transforaminal  Findings:    -Comments: Excellent flow of contrast along the nerve and into the epidural space.  Procedure Details: After squaring off the end-plates to get a true AP view, the C-arm was positioned so that an oblique view of the foramen as noted above was visualized. The target area is just inferior to the "nose of the scotty dog" or sub pedicular. The soft tissues overlying this structure were infiltrated with 2-3 ml. of 1% Lidocaine without Epinephrine.  The spinal needle was inserted toward the target using a "trajectory" view along the fluoroscope beam.  Under AP and lateral visualization, the needle was advanced so it did not puncture dura and was located close the 6 O'Clock position of the pedical in AP tracterory. Biplanar projections were used to confirm position. Aspiration was confirmed to be negative for CSF and/or blood. A 1-2 ml. volume of Isovue-250 was injected and flow of contrast was noted at each level. Radiographs were obtained for documentation purposes.   After attaining the desired flow of contrast documented above, a 0.5 to 1.0 ml test dose of 0.25% Marcaine  was injected into each respective transforaminal space.  The patient was observed for 90 seconds post injection.  After no sensory deficits were reported, and normal lower extremity motor function was noted,   the above injectate was administered so that equal amounts of the injectate were placed at each foramen (level)  into the transforaminal epidural space.   Additional Comments:  The patient tolerated the procedure well Dressing: 2 x 2 sterile gauze and Band-Aid    Post-procedure details: Patient was observed during the procedure. Post-procedure instructions were reviewed.  Patient left the clinic in stable condition.     Clinical History: MRI LUMBAR SPINE WITHOUT CONTRAST  TECHNIQUE: Multiplanar, multisequence MR imaging of the lumbar spine was performed. No intravenous contrast was administered.  COMPARISON:  11/23/2014  FINDINGS: Segmentation: The numbering used on the prior study is continued on today's examination with the lowest fully formed intervertebral disc space designated L5-S1. With this numbering, there are no ribs at T12.  Alignment: Chronically exaggerated lumbar lordosis. New or increased anterolisthesis of L4 on L5 measuring 5 mm. Mild lumbar levoscoliosis.  Vertebrae: No fracture, suspicious osseous lesion, or significant marrow edema. Unchanged L3 vertebral body hemangioma. 10 mm mass in the left aspect of the spinal canal at T9-10, only imaged sagittally (previously 7 mm).  Conus medullaris and cauda equina: Conus extends to the T12-L1 level. Conus and cauda equina appear normal.  Paraspinal and other soft tissues: Chronic severe right renal atrophy and hydronephrosis.  Disc levels:  Disc desiccation throughout the lumbar spine with exception of L5-S1.  T10-11: Only imaged sagittally. Minimal disc bulging and mild facet arthrosis without significant stenosis, unchanged.  T11-12: Only imaged sagittally. Mild disc bulging and small central disc protrusion without stenosis, unchanged.  T12-L1: Mild disc bulging greater to the left and mild facet arthrosis without stenosis, unchanged.  L1-2: Mild disc bulging greater to the right and mild facet hypertrophy result in borderline to mild right lateral recess stenosis without spinal or neural  foraminal stenosis, unchanged.  L2-3: Mild disc bulging and mild facet and ligamentum flavum hypertrophy without stenosis, unchanged.  L3-4: Disc bulging greater to the right and mild right and moderate left facet hypertrophy result in mild right neural foraminal stenosis without spinal stenosis, unchanged.  L4-5: New mild disc space narrowing. Anterolisthesis with bulging uncovered disc, a new large left paracentral and subarticular disc extrusion with superior migration to the L4 pedicle level, and severe facet and ligamentum flavum hypertrophy result in progressive severe spinal stenosis, severe left and moderate right lateral recess stenosis, and mild-to-moderate right and moderate to severe left neural foraminal stenosis. The disc extrusion may affect the left L4 and L5 nerve roots. There are bilateral facet joint effusions.  L5-S1: Minimal leftward disc bulging and severe facet hypertrophy result in moderate left lateral recess and moderate left neural foraminal stenosis, unchanged. No spinal stenosis.  IMPRESSION: 1. Progressive disc and facet degeneration at L4-5 with new anterolisthesis and a new large left paracentral/subarticular disc extrusion. Severe spinal and left lateral recess stenosis and moderate to severe left neural foraminal stenosis. 2. Unchanged disc and facet degeneration elsewhere as above. 3. Mildly increased size of left-sided spinal canal mass at T9-10, now 10 mm. Slow growth favors a benign entity such as a peripheral nerve sheath tumor, however consider thoracic spine MRI without and with contrast for further assessment.   Electronically Signed   By: Logan Bores M.D.   On: 01/09/2018 07:36     Objective:  VS:  HT:  WT:   BMI:     BP:(!) 174/75  HR:74bpm  TEMP: ( )  RESP:  Physical Exam Constitutional:      General: She is not in acute distress.    Appearance: Normal appearance. She is not ill-appearing.  HENT:     Head:  Normocephalic and atraumatic.     Right Ear: External ear normal.     Left Ear: External ear normal.  Eyes:     Extraocular Movements: Extraocular movements intact.  Cardiovascular:     Rate and Rhythm: Normal rate.     Pulses: Normal pulses.  Musculoskeletal:     Right lower leg: No edema.     Left lower leg: No edema.     Comments: Patient has good distal strength with no pain over the greater trochanters.  No clonus or focal weakness.  Skin:    Findings: No erythema, lesion or rash.  Neurological:     General: No focal deficit present.     Mental Status: She is alert and oriented to person, place, and time.     Sensory: No sensory deficit.     Motor: No weakness or abnormal muscle tone.     Coordination: Coordination normal.  Psychiatric:        Mood and Affect: Mood normal.        Behavior: Behavior normal.      Imaging: XR C-ARM NO REPORT  Result Date: 07/21/2019 Please see Notes tab for imaging impression.

## 2019-07-21 NOTE — Progress Notes (Signed)
Pt states pain is in the left lower back down leg. Pt states pain is better with medication. Pt states pain worse when off the medication.  Numeric Pain Rating Scale and Functional Assessment Average Pain (9)   In the last MONTH (on 0-10 scale) has pain interfered with the following?  1. General activity like being  able to carry out your everyday physical activities such as walking, climbing stairs, carrying groceries, or moving a chair?  Rating(9)   +Driver, -BT, -Dye Allergies.

## 2019-07-22 ENCOUNTER — Ambulatory Visit: Payer: Medicaid Other | Admitting: Family Medicine

## 2019-07-22 NOTE — Procedures (Signed)
Lumbosacral Transforaminal Epidural Steroid Injection - Sub-Pedicular Approach with Fluoroscopic Guidance  Patient: Joyce Gilmore      Date of Birth: July 26, 1934 MRN: [id number] PCP: Alycia Rossetti, MD      Visit Date: 07/21/2019   Universal Protocol:    Date/Time: 07/21/2019  Consent Given By: the patient  Position: PRONE  Additional Comments: Vital signs were monitored before and after the procedure. Patient was prepped and draped in the usual sterile fashion. The correct patient, procedure, and site was verified.   Injection Procedure Details:  Procedure Site One Meds Administered:  Meds ordered this encounter  Medications  . methylPREDNISolone acetate (DEPO-MEDROL) injection 80 mg    Laterality: Left  Location/Site:  L4-L5  Needle size: 22 G  Needle type: Spinal  Needle Placement: Transforaminal  Findings:    -Comments: Excellent flow of contrast along the nerve and into the epidural space.  Procedure Details: After squaring off the end-plates to get a true AP view, the C-arm was positioned so that an oblique view of the foramen as noted above was visualized. The target area is just inferior to the "nose of the scotty dog" or sub pedicular. The soft tissues overlying this structure were infiltrated with 2-3 ml. of 1% Lidocaine without Epinephrine.  The spinal needle was inserted toward the target using a "trajectory" view along the fluoroscope beam.  Under AP and lateral visualization, the needle was advanced so it did not puncture dura and was located close the 6 O'Clock position of the pedical in AP tracterory. Biplanar projections were used to confirm position. Aspiration was confirmed to be negative for CSF and/or blood. A 1-2 ml. volume of Isovue-250 was injected and flow of contrast was noted at each level. Radiographs were obtained for documentation purposes.   After attaining the desired flow of contrast documented above, a 0.5 to 1.0 ml test dose of  0.25% Marcaine was injected into each respective transforaminal space.  The patient was observed for 90 seconds post injection.  After no sensory deficits were reported, and normal lower extremity motor function was noted,   the above injectate was administered so that equal amounts of the injectate were placed at each foramen (level) into the transforaminal epidural space.   Additional Comments:  The patient tolerated the procedure well Dressing: 2 x 2 sterile gauze and Band-Aid    Post-procedure details: Patient was observed during the procedure. Post-procedure instructions were reviewed.  Patient left the clinic in stable condition.

## 2019-07-26 ENCOUNTER — Other Ambulatory Visit: Payer: Self-pay | Admitting: Family Medicine

## 2019-09-06 ENCOUNTER — Other Ambulatory Visit: Payer: Self-pay | Admitting: Family Medicine

## 2019-09-28 ENCOUNTER — Other Ambulatory Visit: Payer: Self-pay | Admitting: Family Medicine

## 2019-09-28 DIAGNOSIS — Z1231 Encounter for screening mammogram for malignant neoplasm of breast: Secondary | ICD-10-CM

## 2019-10-05 ENCOUNTER — Other Ambulatory Visit: Payer: Self-pay | Admitting: Family Medicine

## 2019-10-06 ENCOUNTER — Other Ambulatory Visit: Payer: Self-pay | Admitting: Family Medicine

## 2019-10-06 NOTE — Telephone Encounter (Signed)
Ok to refill??  Last office visit 06/27/2019.  Last refill 03/22/2019, #1 refill.

## 2019-10-24 ENCOUNTER — Telehealth: Payer: Self-pay | Admitting: *Deleted

## 2019-10-24 NOTE — Telephone Encounter (Signed)
Received request from pharmacy for PA on Tramadol.   PA submitted.   Dx: M51.36- DDD, Lumbar.  Ms. Joyce Gilmore suffers from degenerative disc disease of the lumbar spine (M51.36). She has great amounts of pain that are easily treated with Tramadol. Ms. Joyce Gilmore complies with the treatment plan and the high risks associated with Tramadol are easily mitigated by frequent follow up visits, routine monitoring, and symptom management.      Your information has been sent to [facility]

## 2019-10-25 ENCOUNTER — Other Ambulatory Visit: Payer: Self-pay

## 2019-10-25 ENCOUNTER — Encounter: Payer: Self-pay | Admitting: Family Medicine

## 2019-10-25 ENCOUNTER — Ambulatory Visit: Payer: Medicaid Other | Admitting: Family Medicine

## 2019-10-25 VITALS — BP 142/80 | HR 58 | Temp 97.6°F | Resp 14 | Ht <= 58 in | Wt 155.0 lb

## 2019-10-25 DIAGNOSIS — Z794 Long term (current) use of insulin: Secondary | ICD-10-CM

## 2019-10-25 DIAGNOSIS — E7849 Other hyperlipidemia: Secondary | ICD-10-CM

## 2019-10-25 DIAGNOSIS — I5032 Chronic diastolic (congestive) heart failure: Secondary | ICD-10-CM

## 2019-10-25 DIAGNOSIS — N182 Chronic kidney disease, stage 2 (mild): Secondary | ICD-10-CM | POA: Diagnosis not present

## 2019-10-25 DIAGNOSIS — E1122 Type 2 diabetes mellitus with diabetic chronic kidney disease: Secondary | ICD-10-CM | POA: Diagnosis not present

## 2019-10-25 DIAGNOSIS — K7469 Other cirrhosis of liver: Secondary | ICD-10-CM

## 2019-10-25 DIAGNOSIS — I10 Essential (primary) hypertension: Secondary | ICD-10-CM | POA: Diagnosis not present

## 2019-10-25 DIAGNOSIS — M5136 Other intervertebral disc degeneration, lumbar region: Secondary | ICD-10-CM | POA: Diagnosis not present

## 2019-10-25 NOTE — Assessment & Plan Note (Addendum)
Reduce lantus to  38 units, last A1C 6.6% and pt having hypoglycemia symptoms  Continue novolog 6 units with meals On STATIN, ACEI Referral to eye doctor

## 2019-10-25 NOTE — Assessment & Plan Note (Signed)
bp at goal, no changes to meds

## 2019-10-25 NOTE — Patient Instructions (Addendum)
Decrease lantus to  38 units in the morning  Eye  Doctor referral  F/U 4 months

## 2019-10-25 NOTE — Assessment & Plan Note (Signed)
Currently compensated Continue low dose beta blocker and lasix

## 2019-10-25 NOTE — Assessment & Plan Note (Signed)
Noted on imaging, LFT have been good No ascites

## 2019-10-25 NOTE — Progress Notes (Signed)
   Subjective:    Patient ID: Joyce Gilmore, female    DOB: [birth year], 85 y.o.   MRN: [id number]  Patient presents for Follow-up (is fasting) Patient here to follow-up chronic medical.  Medications reviewed.  DM- last A1C  6.6%, taking  Lantus  40 units, Novolog  6 units with meals   CBG average 122 , fasting 76 , most of fasting have been between 70-110, she feels a little weak sometimes  HTN- taking bp meds, BP was elevated back in May, she does not check at home, typically systolic in 865'H at baseline   Hyperlipidemia LDL at goal 67 on labs from March   Diastolic heart failure- no fluids overload, weight down 3lbs  Chronic asthma/bronchitis- no recent flares, has inhalers on hand   Cirrhosis check LFT, no sign of ascites, no symptoms   Meds reviewed   She returned from [place] last week  Feels good   Review Of Systems:  GEN- denies fatigue, fever, weight loss,weakness, recent illness HEENT- denies eye drainage, change in vision, nasal discharge, CVS- denies chest pain, palpitations RESP- denies SOB, cough, wheeze ABD- denies N/V, change in stools, abd pain GU- denies dysuria, hematuria, dribbling, incontinence MSK- denies joint pain, muscle aches, injury Neuro- denies headache, dizziness, syncope, seizure activity       Objective:    BP (!) 142/80   Pulse (!) 58   Temp 97.6 F (36.4 C) (Temporal)   Resp 14   Ht 4\' 8"  (1.422 m)   Wt 155 lb (70.3 kg)   SpO2 96%   BMI 34.75 kg/m  GEN- NAD, alert and oriented x3 HEENT- PERRL, EOMI, non injected sclera, pink conjunctiva, MMM, oropharynx clear Neck- Supple, no thyromegaly CVS- RRR, no murmur RESP-CTAB ABD-NABS,soft,NT,ND EXT- No edema Pulses- Radial, DP- 2+        Assessment & Plan:      Problem List Items Addressed This Visit      Unprioritized   Chronic diastolic heart failure (HCC)    Currently compensated Continue low dose beta blocker and lasix       DDD (degenerative disc disease), lumbar     Continue ultram       Essential hypertension - Primary    bp at goal, no changes to meds       Relevant Orders   COMPLETE METABOLIC PANEL WITH GFR   Hepatic cirrhosis (HCC)    Noted on imaging, LFT have been good No ascites       HLD (hyperlipidemia)   Type 2 diabetes mellitus with diabetic chronic kidney disease (HCC)    Reduce lantus to  38 units, last A1C 6.6% and pt having hypoglycemia symptoms  Continue novolog 6 units with meals On STATIN, ACEI Referral to eye doctor       Relevant Orders   Hemoglobin A1c   CBC with Differential/Platelet   Microalbumin / creatinine urine ratio   Ambulatory referral to Ophthalmology   HM DIABETES FOOT EXAM (Completed)      Note: This dictation was prepared with Dragon dictation along with smaller phrase technology. Any transcriptional errors that result from this process are unintentional.

## 2019-10-25 NOTE — Assessment & Plan Note (Signed)
Continue ultram

## 2019-10-26 LAB — CBC WITH DIFFERENTIAL/PLATELET
Absolute Monocytes: 681 cells/uL (ref 200–950)
Basophils Absolute: 42 cells/uL (ref 0–200)
Basophils Relative: 0.5 %
Eosinophils Absolute: 108 cells/uL (ref 15–500)
Eosinophils Relative: 1.3 %
HCT: 37.9 % (ref 35.0–45.0)
Hemoglobin: 11.9 g/dL (ref 11.7–15.5)
Lymphs Abs: 2822 cells/uL (ref 850–3900)
MCH: 26 pg — ABNORMAL LOW (ref 27.0–33.0)
MCHC: 31.4 g/dL — ABNORMAL LOW (ref 32.0–36.0)
MCV: 82.8 fL (ref 80.0–100.0)
MPV: 10.2 fL (ref 7.5–12.5)
Monocytes Relative: 8.2 %
Neutro Abs: 4648 cells/uL (ref 1500–7800)
Neutrophils Relative %: 56 %
Platelets: 195 10*3/uL (ref 140–400)
RBC: 4.58 10*6/uL (ref 3.80–5.10)
RDW: 14.1 % (ref 11.0–15.0)
Total Lymphocyte: 34 %
WBC: 8.3 10*3/uL (ref 3.8–10.8)

## 2019-10-26 LAB — COMPLETE METABOLIC PANEL WITH GFR
AG Ratio: 1.4 (calc) (ref 1.0–2.5)
ALT: 14 U/L (ref 6–29)
AST: 18 U/L (ref 10–35)
Albumin: 3.9 g/dL (ref 3.6–5.1)
Alkaline phosphatase (APISO): 77 U/L (ref 37–153)
BUN/Creatinine Ratio: 28 (calc) — ABNORMAL HIGH (ref 6–22)
BUN: 27 mg/dL — ABNORMAL HIGH (ref 7–25)
CO2: 25 mmol/L (ref 20–32)
Calcium: 9.3 mg/dL (ref 8.6–10.4)
Chloride: 103 mmol/L (ref 98–110)
Creat: 0.98 mg/dL — ABNORMAL HIGH (ref 0.60–0.88)
GFR, Est African American: 62 mL/min/{1.73_m2} (ref >=60)
GFR, Est Non African American: 53 mL/min/{1.73_m2} — ABNORMAL LOW (ref >=60)
Globulin: 2.7 g/dL (calc) (ref 1.9–3.7)
Glucose, Bld: 39 mg/dL — CL (ref 65–99)
Potassium: 4.5 mmol/L (ref 3.5–5.3)
Sodium: 139 mmol/L (ref 135–146)
Total Bilirubin: 0.3 mg/dL (ref 0.2–1.2)
Total Protein: 6.6 g/dL (ref 6.1–8.1)

## 2019-10-26 LAB — MICROALBUMIN / CREATININE URINE RATIO
Creatinine, Urine: 43 mg/dL (ref 20–275)
Microalb Creat Ratio: 19 mcg/mg creat (ref <30)
Microalb, Ur: 0.8 mg/dL

## 2019-10-26 LAB — HEMOGLOBIN A1C
Hgb A1c MFr Bld: 7 % of total Hgb — ABNORMAL HIGH (ref <5.7)
Mean Plasma Glucose: 154 (calc)
eAG (mmol/L): 8.5 (calc)

## 2019-10-27 ENCOUNTER — Other Ambulatory Visit: Payer: Self-pay | Admitting: *Deleted

## 2019-10-27 MED ORDER — LANTUS SOLOSTAR 100 UNIT/ML ~~LOC~~ SOPN: PEN_INJECTOR | SUBCUTANEOUS | 5 refills | Status: DC

## 2019-11-02 ENCOUNTER — Other Ambulatory Visit: Payer: Self-pay | Admitting: Nurse Practitioner

## 2019-11-02 DIAGNOSIS — K219 Gastro-esophageal reflux disease without esophagitis: Secondary | ICD-10-CM

## 2019-11-03 ENCOUNTER — Ambulatory Visit
Admission: RE | Admit: 2019-11-03 | Discharge: 2019-11-03 | Disposition: A | Discharge status: Home / Self Care | Payer: Medicaid Other | Source: Ambulatory Visit | Attending: Family Medicine | Admitting: Family Medicine

## 2019-11-03 ENCOUNTER — Other Ambulatory Visit: Payer: Self-pay

## 2019-11-03 DIAGNOSIS — Z1231 Encounter for screening mammogram for malignant neoplasm of breast: Secondary | ICD-10-CM | POA: Diagnosis not present

## 2019-11-03 NOTE — Telephone Encounter (Signed)
Received PA determination.   PA approved 10/26/2019- 04/23/2020.

## 2019-11-14 ENCOUNTER — Other Ambulatory Visit: Payer: Self-pay | Admitting: Family Medicine

## 2019-11-15 ENCOUNTER — Ambulatory Visit (INDEPENDENT_AMBULATORY_CARE_PROVIDER_SITE_OTHER): Payer: Medicaid Other | Admitting: Family Medicine

## 2019-11-15 ENCOUNTER — Other Ambulatory Visit: Payer: Self-pay

## 2019-11-15 ENCOUNTER — Encounter: Payer: Self-pay | Admitting: Family Medicine

## 2019-11-15 VITALS — BP 140/86 | HR 63 | Temp 98.1°F | Resp 14 | Ht <= 58 in | Wt 155.0 lb

## 2019-11-15 DIAGNOSIS — Z23 Encounter for immunization: Secondary | ICD-10-CM | POA: Diagnosis not present

## 2019-11-15 DIAGNOSIS — K1379 Other lesions of oral mucosa: Secondary | ICD-10-CM | POA: Diagnosis not present

## 2019-11-15 MED ORDER — LORATADINE 10 MG PO TABS: ORAL_TABLET | ORAL | 2 refills | Status: DC

## 2019-11-15 NOTE — Patient Instructions (Signed)
Do not take the baby aspirin until we have her seen by ENT doctor REferral to Ear nose and throat doctor  F/U as previous

## 2019-11-15 NOTE — Progress Notes (Signed)
   Subjective:    Patient ID: Joyce Gilmore, female    DOB: [birth year], 85 y.o.   MRN: [id number]  Patient presents for Blood in Mouth (blood from palate? when she is sleeping)   Pt here with episodes of blood in her mouth.  She has had 3 episodes over the past week.  She has had this in the past and was told there was a vessel on her palate.  Her daughter did note a red spot on her palate worsening like the blood was coming from.  She denies coughing up any blood denies any reflux symptoms or chest pain or shortness of breath.  She has been in her normal state of health since her last visit a few weeks ago.  She does feel like her dentures fit well have not been sliding.  She does not have any other ulcerations in the mouth.   Review Of Systems:  GEN- denies fatigue, fever, weight loss,weakness, recent illness HEENT- denies eye drainage, change in vision, nasal discharge, CVS- denies chest pain, palpitations RESP- denies SOB, cough, wheeze ABD- denies N/V, change in stools, abd pain GU- denies dysuria, hematuria, dribbling, incontinence MSK- denies joint pain, muscle aches, injury Neuro- denies headache, dizziness, syncope, seizure activity       Objective:    BP 140/86   Pulse 63   Temp 98.1 F (36.7 C) (Temporal)   Resp 14   Ht 4\' 8"  (1.422 m)   Wt 155 lb (70.3 kg)   SpO2 93%   BMI 34.75 kg/m  GEN- NAD, alert and oriented x3 HEENT- PERRL, EOMI, non injected sclera, pink conjunctiva, MMM, oropharynx clear, center of palate vessel noted with erythema, no active bleed, NT, no ulcers  Neck- Supple, no LAD  CVS- RRR, no murmur RESP-CTAB ABD-NABS,soft,NT,ND         Assessment & Plan:      Problem List Items Addressed This Visit    None    Visit Diagnoses    Bleeding from mouth    -  Primary PALATE LESION    Appears to have bleeding vessel on palate, no other sign of infection, luings clear, dentures in place, will send to ENT as thi shas bleed multiple times, hold  ASA until evaluated in case biopsy needed       Note: This dictation was prepared with Dragon dictation along with smaller phrase technology. Any transcriptional errors that result from this process are unintentional.

## 2019-11-16 ENCOUNTER — Encounter: Payer: Self-pay | Admitting: Family Medicine

## 2019-11-28 ENCOUNTER — Telehealth: Payer: Self-pay | Admitting: Family Medicine

## 2019-11-28 ENCOUNTER — Encounter: Payer: Self-pay | Admitting: Physician Assistant

## 2019-11-28 ENCOUNTER — Ambulatory Visit: Payer: Self-pay

## 2019-11-28 ENCOUNTER — Ambulatory Visit (INDEPENDENT_AMBULATORY_CARE_PROVIDER_SITE_OTHER): Payer: Medicaid Other | Admitting: Physician Assistant

## 2019-11-28 DIAGNOSIS — M10072 Idiopathic gout, left ankle and foot: Secondary | ICD-10-CM | POA: Diagnosis not present

## 2019-11-28 DIAGNOSIS — M25572 Pain in left ankle and joints of left foot: Secondary | ICD-10-CM | POA: Diagnosis not present

## 2019-11-28 MED ORDER — COLCHICINE 0.6 MG PO CAPS: 0.6000 mg | ORAL_CAPSULE | Freq: Every day | ORAL | 0 refills | Status: DC

## 2019-11-28 NOTE — Telephone Encounter (Signed)
Her  granddaughter called on Saturday evening.  Her blood sugar had elevated to 324.  She states that she did not feel good but did not have any particular symptoms such as fever cough congestion nausea vomiting diarrhea.  She has not taken her insulin.  Advised her to eat a small amount and give 5 units of NovoLog and then wait and take her regular Lantus.  Her blood sugar did not go down 1 hour after the NovoLog into the 200s and they should call me back.  I was the only elevated blood sugar reading.  Call and check on blood sugars

## 2019-11-28 NOTE — Progress Notes (Signed)
Office Visit Note   Patient: Joyce Gilmore           Date of Birth: [birth year]           MRN: [id number] Visit Date: 11/28/2019              Requested by: Alycia Rossetti, MD [address] [place],  [place] [address] PCP: Alycia Rossetti, MD   Assessment & Plan: Visit Diagnoses:  1. Pain in left ankle and joints of left foot   2. Acute idiopathic gout of left foot     Plan: We will place her on a short dose of colchicine.  Patient unable to take Dosepak due to her diabetes and recently poor control glucose levels over the weekend.  Have her follow-up with her primary care physician for treatment of her gout long-term.  Questions were encouraged and answered at length today.  Interpreter was used throughout the exam due to the fact patient speaks Spanish.  Follow-Up Instructions: Return if symptoms worsen or fail to improve.   Orders:  Orders Placed This Encounter  Procedures  . XR Ankle Complete Left  . XR Foot Complete Left   Meds ordered this encounter  Medications  . Colchicine 0.6 MG CAPS    Sig: Take 0.6 mg by mouth daily.    Dispense:  5 capsule    Refill:  0      Procedures: No procedures performed   Clinical Data: No additional findings.   Subjective: Chief Complaint  Patient presents with  . Left Foot - Pain  . Left Ankle - Pain    HPI Joyce Gilmore is an 85 year old female comes in today with left ankle and foot pain that started this past Friday.  She notes some swelling of the foot.  She had no known injury.  No bruising.  States the foot is warm.  She does have pain with the foot if it is touched even by she.  No fevers or chills.  No history of gout.  Her granddaughter who works in our office states that her glucose levels have been up over the weekend. Review of Systems See HPI.  Objective: Vital Signs: There were no vitals taken for this visit.  Physical Exam General: Well-developed well-nourished female no acute distress. Ortho  Exam Left foot positive warmth and slight erythema over the dorsal aspect of the foot involving the first MP joint.  She has tenderness maximally over the first MP joint of the left foot.  Remainder the foot is nontender.  There is no rashes skin lesions ulcerations or impending ulcers. Specialty Comments:  No specialty comments available.  Imaging: XR Ankle Complete Left  Result Date: 11/28/2019 Left ankle 3 views: Talus well located within the ankle mortise without diastases.  No acute fractures no bony abnormalities.  XR Foot Complete Left  Result Date: 11/28/2019 Left foot 3 views: No acute fractures.  Arthrosclerosis noted multiple vessels.  Haglund's deformity and plantar heel spur are noted.  No bony lesions.    PMFS History: Patient Active Problem List   Diagnosis Date Noted  . Chronic kidney disease 05/07/2018  . GERD (gastroesophageal reflux disease) 03/10/2017  . Uses hearing aid 09/12/2016  . Asthmatic bronchitis 03/11/2016  . Chronic diastolic heart failure ([facility]) 10/12/2015  . Seasonal allergies 11/21/2014  . DDD (degenerative disc disease), lumbar 10/11/2014  . H/O compression fracture of spine 10/11/2014  . Pulmonary hypertension ([facility])   . Hepatic cirrhosis ([facility]) 09/24/2014  .  SIADH (syndrome of inappropriate ADH production) ([facility]) 06/13/2014  . Insomnia 12/28/2013  . Anemia 07/06/2013  . HLD (hyperlipidemia) 06/27/2013  . Osteoporosis 07/26/2012  . Hyponatremia 05/16/2011  . Type 2 diabetes mellitus with diabetic chronic kidney disease ([facility]) 08/17/2006  . Essential hypertension 08/17/2006  . DIVERTICULOSIS, COLON 08/17/2006   Past Medical History:  Diagnosis Date  . Arthritis   . CHF (congestive heart failure) (HCC)    diastolic  . Chronic kidney disease    hx of kidney stones,   . Depression    hx of   . Diabetes mellitus without complication ([facility])   . Ganglion, left ankle and foot   . Hearing loss of both ears   . Hypertension   . MRSA infection      Oct 13 - Nov 13  . Osteoporosis   . Tinnitus of both ears     Family History  Problem Relation Age of Onset  . Hypertension Mother   . Colon cancer Neg Hx   . Breast cancer Neg Hx     Past Surgical History:  Procedure Laterality Date  . BACK SURGERY     history restored after error with record merge      . INCISION AND DRAINAGE HIP  12/22/2011   Procedure: IRRIGATION AND DEBRIDEMENT HIP;  Surgeon: Mcarthur Rossetti, MD;  Location: [facility];  Service: Orthopedics;  Laterality: Right;  Irrigation and debridement right hip  . INCISION AND DRAINAGE HIP  12/27/2011   Procedure: IRRIGATION AND DEBRIDEMENT HIP;  Surgeon: Mcarthur Rossetti, MD;  Location: [facility];  Service: Orthopedics;  Laterality: Right;  Repeat irrigation and debridement  . JOINT REPLACEMENT     bilateral knee, right hip   . TOTAL HIP REVISION  12/05/2011   Procedure: TOTAL HIP REVISION;  Surgeon: Mcarthur Rossetti, MD;  Location: [facility];  Service: Orthopedics;  Laterality: Right;  Right Hip Revision Arthroplasty to Total Hip, Excision of Old Implant   Social History   Occupational History  . Not on file  Tobacco Use  . Smoking status: Never Smoker  . Smokeless tobacco: Never Used  Vaping Use  . Vaping Use: Never used  Substance and Sexual Activity  . Alcohol use: No    Alcohol/week: 0.0 standard drinks  . Drug use: No  . Sexual activity: Not Currently

## 2019-12-01 ENCOUNTER — Encounter: Payer: Self-pay | Admitting: Family Medicine

## 2019-12-01 ENCOUNTER — Ambulatory Visit (INDEPENDENT_AMBULATORY_CARE_PROVIDER_SITE_OTHER): Payer: Medicaid Other | Admitting: Family Medicine

## 2019-12-01 ENCOUNTER — Other Ambulatory Visit: Payer: Self-pay

## 2019-12-01 VITALS — BP 138/84 | HR 66 | Temp 98.1°F | Resp 14 | Ht <= 58 in | Wt 161.0 lb

## 2019-12-01 DIAGNOSIS — M109 Gout, unspecified: Secondary | ICD-10-CM

## 2019-12-01 MED ORDER — METHYLPREDNISOLONE ACETATE 80 MG/ML IJ SUSP
60.0000 mg | Freq: Once | INTRAMUSCULAR | Status: AC
  Administered 2019-12-01: 60 mg via INTRAMUSCULAR

## 2019-12-01 NOTE — Progress Notes (Signed)
Subjective:    Patient ID: Joyce Gilmore, female    DOB: [birth year], 85 y.o.   MRN: [id number]  HPI Patient presents today with her granddaughter.  She was seen by orthopedics on October 4 and diagnosed with a gout exacerbation and started on colchicine 0.6 mg daily.  This was for pain in her left ankle she presents today with pain and swelling in her left ankle.  There is minimal erythema.  There is pain with flexion and extension of the ankle.  There is no instability in the joint.  She has a negative anterior drawer sign.  There is no palpable warmth of the joint.  Passive range of motion does not elicit significant pain although the patient reports pain with walking.  She has been taking the colchicine daily for the last 2 to 3 days with only minimal improvement.  Her blood sugars have reportedly been under 200 recently and her last A1c was 7  Past Medical History:  Diagnosis Date  . Arthritis   . CHF (congestive heart failure) (HCC)    diastolic  . Chronic kidney disease    hx of kidney stones,   . Depression    hx of   . Diabetes mellitus without complication ([facility])   . Ganglion, left ankle and foot   . Hearing loss of both ears   . Hypertension   . MRSA infection    Oct 13 - Nov 13  . Osteoporosis   . Tinnitus of both ears    Past Surgical History:  Procedure Laterality Date  . BACK SURGERY     history restored after error with record merge      . INCISION AND DRAINAGE HIP  12/22/2011   Procedure: IRRIGATION AND DEBRIDEMENT HIP;  Surgeon: Mcarthur Rossetti, MD;  Location: [facility];  Service: Orthopedics;  Laterality: Right;  Irrigation and debridement right hip  . INCISION AND DRAINAGE HIP  12/27/2011   Procedure: IRRIGATION AND DEBRIDEMENT HIP;  Surgeon: Mcarthur Rossetti, MD;  Location: [facility];  Service: Orthopedics;  Laterality: Right;  Repeat irrigation and debridement  . JOINT REPLACEMENT     bilateral knee, right hip   . TOTAL HIP REVISION  12/05/2011    Procedure: TOTAL HIP REVISION;  Surgeon: Mcarthur Rossetti, MD;  Location: [facility];  Service: Orthopedics;  Laterality: Right;  Right Hip Revision Arthroplasty to Total Hip, Excision of Old Implant   Current Outpatient Medications on File Prior to Visit  Medication Sig Dispense Refill  . Accu-Chek Softclix Lancets lancets USE AS DIRECTED 200 each 1  . Acetaminophen (TYLENOL PO) Take 1,000 tablets by mouth every 6 (six) hours as needed (pain/headache).     Marland Kitchen albuterol (PROVENTIL) (2.5 MG/3ML) 0.083% nebulizer solution Take 3 mLs (2.5 mg total) by nebulization every 6 (six) hours as needed for wheezing or shortness of breath. 150 mL 1  . albuterol (VENTOLIN HFA) 108 (90 Base) MCG/ACT inhaler Inhale 1-2 puffs into the lungs every 4 (four) hours as needed for wheezing or shortness of breath. 18 g 3  . amLODipine (NORVASC) 5 MG tablet Take 1 tablet (5 mg total) by mouth daily. 90 tablet 1  . aspirin EC 81 MG tablet Take 81 mg by mouth daily.    . B-D ULTRAFINE III SHORT PEN 31G X 8 MM MISC USE AS DIRECTED 100 each 11  . benzonatate (TESSALON) 100 MG capsule TAKE 1 CAPSULE BY MOUTH THREE TIMES DAILY AS NEEDED FOR COUGH 20 capsule 0  .  blood glucose meter kit and supplies KIT Dispense based on patient and insurance preference. Use up to four times daily as directed. (FOR ICD-9 250.00, 250.01). 1 each 0  . calcium carbonate (OSCAL) 1500 (600 Ca) MG TABS tablet Take 600 mg of elemental calcium by mouth 2 (two) times daily with a meal.    . Colchicine 0.6 MG CAPS Take 0.6 mg by mouth daily. 5 capsule 0  . fluticasone (FLONASE) 50 MCG/ACT nasal spray SHAKE LIQUID AND USE 2 SPRAYS IN EACH NOSTRIL DAILY 48 g 1  . Fluticasone Furoate (ARNUITY ELLIPTA) 100 MCG/ACT AEPB Inhale 1 puff into the lungs daily. 90 each 3  . furosemide (LASIX) 20 MG tablet TAKE 1 TABLET BY MOUTH EVERY DAY 90 tablet 3  . glucose blood (ACCU-CHEK AVIVA PLUS) test strip USE TO CHECK BLOOD SUGAR FOUR TIMES DAILY AS DIRECTED 150 each 3    . HYDROcodone-acetaminophen (NORCO/VICODIN) 5-325 MG tablet Take 1 tablet by mouth every 6 (six) hours as needed. 40 tablet 0  . insulin glargine (LANTUS SOLOSTAR) 100 UNIT/ML Solostar Pen INJECT  38 UNITS UNDER THE SKIN AT BEDTIME 15 mL 5  . lansoprazole (PREVACID) 30 MG capsule Take 1 capsule (30 mg total) by mouth daily at 12 noon. 90 capsule 1  . lisinopril (ZESTRIL) 10 MG tablet Take 1 tablet (10 mg total) by mouth daily. 90 tablet 3  . loratadine (CLARITIN) 10 MG tablet TAKE 1 TABLET(10 MG) BY MOUTH DAILY AS NEEDED FOR ALLERGIES 30 tablet 2  . metoprolol tartrate (LOPRESSOR) 25 MG tablet Take 0.5 tablets (12.5 mg total) by mouth 2 (two) times daily. 45 tablet 3  . NOVOLOG FLEXPEN 100 UNIT/ML FlexPen ADMINISTER 5 TO 15 UNITS UNDER THE SKIN THREE TIMES DAILY WITH MEALS PER SLIDING SCALE 15 mL 1  . omeprazole (PRILOSEC) 40 MG capsule TAKE 1 CAPSULE(40 MG) BY MOUTH DAILY 30 capsule 3  . polyvinyl alcohol (ARTIFICIAL TEARS) 1.4 % ophthalmic solution Place 1 drop into both eyes as needed for dry eyes. 15 mL 3  . pravastatin (PRAVACHOL) 20 MG tablet Take 1 tablet (20 mg total) by mouth daily. 90 tablet 3  . traMADol (ULTRAM) 50 MG tablet TAKE 1 TABLET(50 MG) BY MOUTH TWICE DAILY AS NEEDED FOR CHRONIC PAIN 60 tablet 1   No current facility-administered medications on file prior to visit.   Allergies  Allergen Reactions  . Bactrim [Sulfamethoxazole-Trimethoprim] Other (See Comments)    Hyperkalemia (elevated potassium)  . Rifampin Other (See Comments)    Severe thrombocytopenia (low blood platelet count)  . Vancomycin Other (See Comments)    Severe thrombocytopenia (low blood platelet count)   Social History   Socioeconomic History  . Marital status: Widowed    Spouse name: Not on file  . Number of children: 7  . Years of education: Not on file  . Highest education level: Not on file  Occupational History  . Not on file  Tobacco Use  . Smoking status: Never Smoker  . Smokeless  tobacco: Never Used  Vaping Use  . Vaping Use: Never used  Substance and Sexual Activity  . Alcohol use: No    Alcohol/week: 0.0 standard drinks  . Drug use: No  . Sexual activity: Not Currently  Other Topics Concern  . Not on file  Social History Narrative   ** Merged History Encounter **       Social Determinants of Health   Financial Resource Strain:   . Difficulty of Paying Living Expenses: Not on file  Food Insecurity:   . Worried About Charity fundraiser in the Last Year: Not on file  . Ran Out of Food in the Last Year: Not on file  Transportation Needs:   . Lack of Transportation (Medical): Not on file  . Lack of Transportation (Non-Medical): Not on file  Physical Activity:   . Days of Exercise per Week: Not on file  . Minutes of Exercise per Session: Not on file  Stress:   . Feeling of Stress : Not on file  Social Connections:   . Frequency of Communication with Friends and Family: Not on file  . Frequency of Social Gatherings with Friends and Family: Not on file  . Attends Religious Services: Not on file  . Active Member of Clubs or Organizations: Not on file  . Attends Archivist Meetings: Not on file  . Marital Status: Not on file  Intimate Partner Violence:   . Fear of Current or Ex-Partner: Not on file  . Emotionally Abused: Not on file  . Physically Abused: Not on file  . Sexually Abused: Not on file     Review of Systems  All other systems reviewed and are negative.      Objective:   Physical Exam Vitals reviewed.  Constitutional:      Appearance: She is well-developed.  Cardiovascular:     Rate and Rhythm: Normal rate and regular rhythm.     Heart sounds: Normal heart sounds. No murmur heard.   Pulmonary:     Effort: Pulmonary effort is normal. No respiratory distress.     Breath sounds: Normal breath sounds. No wheezing or rales.  Abdominal:     General: Bowel sounds are normal. There is no distension.     Palpations: Abdomen  is soft. There is no mass.     Tenderness: There is no abdominal tenderness. There is no guarding or rebound.  Musculoskeletal:     Left ankle: Swelling present. Tenderness present over the lateral malleolus and medial malleolus. Decreased range of motion.           Assessment & Plan:  Exacerbation of gout  We will try Depo-Medrol 60 mg IM x1 and then resume colchicine 0.6 mg daily and then recheck here on Monday.  I explained that the blood sugars would likely increase for 48 hours and that as long as they stayed 300 or less we would simply just "wait it out".  Contact our office if they stay greater than 300 consistently.

## 2019-12-01 NOTE — Telephone Encounter (Signed)
Patient seen in office today with grand-daughter.   States that her blood sugars have been fine.

## 2019-12-01 NOTE — Addendum Note (Signed)
Addended by: Sheral Flow on: 12/01/2019 03:01 PM   Modules accepted: Orders

## 2019-12-06 ENCOUNTER — Other Ambulatory Visit: Payer: Self-pay | Admitting: Physician Assistant

## 2019-12-06 NOTE — Telephone Encounter (Signed)
Please advise (Liz's grandma)

## 2019-12-14 ENCOUNTER — Other Ambulatory Visit: Payer: Self-pay | Admitting: Physician Assistant

## 2019-12-14 NOTE — Telephone Encounter (Signed)
Please advise 

## 2019-12-17 ENCOUNTER — Other Ambulatory Visit: Payer: Self-pay | Admitting: Family Medicine

## 2019-12-19 ENCOUNTER — Emergency Department (HOSPITAL_COMMUNITY): Payer: Medicaid Other

## 2019-12-19 ENCOUNTER — Other Ambulatory Visit: Payer: Self-pay

## 2019-12-19 ENCOUNTER — Ambulatory Visit
Admission: EM | Admit: 2019-12-19 | Discharge: 2019-12-19 | Disposition: A | Discharge status: Home / Self Care | Payer: Medicaid Other | Attending: Physician Assistant | Admitting: Physician Assistant

## 2019-12-19 ENCOUNTER — Emergency Department (HOSPITAL_COMMUNITY)
Admission: EM | Admit: 2019-12-19 | Discharge: 2019-12-19 | Disposition: A | Discharge status: Home / Self Care | Payer: Medicaid Other | Attending: Emergency Medicine | Admitting: Emergency Medicine

## 2019-12-19 ENCOUNTER — Encounter (HOSPITAL_COMMUNITY): Payer: Self-pay | Admitting: Emergency Medicine

## 2019-12-19 DIAGNOSIS — K1379 Other lesions of oral mucosa: Secondary | ICD-10-CM | POA: Diagnosis not present

## 2019-12-19 DIAGNOSIS — J90 Pleural effusion, not elsewhere classified: Secondary | ICD-10-CM | POA: Diagnosis not present

## 2019-12-19 DIAGNOSIS — E1122 Type 2 diabetes mellitus with diabetic chronic kidney disease: Secondary | ICD-10-CM | POA: Insufficient documentation

## 2019-12-19 DIAGNOSIS — Z79899 Other long term (current) drug therapy: Secondary | ICD-10-CM | POA: Diagnosis not present

## 2019-12-19 DIAGNOSIS — Z794 Long term (current) use of insulin: Secondary | ICD-10-CM | POA: Insufficient documentation

## 2019-12-19 DIAGNOSIS — N189 Chronic kidney disease, unspecified: Secondary | ICD-10-CM | POA: Insufficient documentation

## 2019-12-19 DIAGNOSIS — Z96641 Presence of right artificial hip joint: Secondary | ICD-10-CM | POA: Diagnosis not present

## 2019-12-19 DIAGNOSIS — R079 Chest pain, unspecified: Secondary | ICD-10-CM | POA: Insufficient documentation

## 2019-12-19 DIAGNOSIS — R042 Hemoptysis: Secondary | ICD-10-CM | POA: Diagnosis present

## 2019-12-19 DIAGNOSIS — I5032 Chronic diastolic (congestive) heart failure: Secondary | ICD-10-CM | POA: Insufficient documentation

## 2019-12-19 DIAGNOSIS — I130 Hypertensive heart and chronic kidney disease with heart failure and stage 1 through stage 4 chronic kidney disease, or unspecified chronic kidney disease: Secondary | ICD-10-CM | POA: Diagnosis not present

## 2019-12-19 DIAGNOSIS — Z7951 Long term (current) use of inhaled steroids: Secondary | ICD-10-CM | POA: Insufficient documentation

## 2019-12-19 DIAGNOSIS — Z96653 Presence of artificial knee joint, bilateral: Secondary | ICD-10-CM | POA: Diagnosis not present

## 2019-12-19 LAB — CBC
HCT: 37.8 % (ref 36.0–46.0)
Hemoglobin: 11.7 g/dL — ABNORMAL LOW (ref 12.0–15.0)
MCH: 25.4 pg — ABNORMAL LOW (ref 26.0–34.0)
MCHC: 31 g/dL (ref 30.0–36.0)
MCV: 82 fL (ref 80.0–100.0)
Platelets: 200 10*3/uL (ref 150–400)
RBC: 4.61 MIL/uL (ref 3.87–5.11)
RDW: 15.5 % (ref 11.5–15.5)
WBC: 9.2 10*3/uL (ref 4.0–10.5)
nRBC: 0 % (ref 0.0–0.2)

## 2019-12-19 LAB — PROTIME-INR
INR: 1.1 (ref 0.8–1.2)
Prothrombin Time: 14.1 seconds (ref 11.4–15.2)

## 2019-12-19 LAB — BASIC METABOLIC PANEL
Anion gap: 8 (ref 5–15)
BUN: 20 mg/dL (ref 8–23)
CO2: 25 mmol/L (ref 22–32)
Calcium: 9.5 mg/dL (ref 8.9–10.3)
Chloride: 103 mmol/L (ref 98–111)
Creatinine, Ser: 1.04 mg/dL — ABNORMAL HIGH (ref 0.44–1.00)
GFR, Estimated: 53 mL/min — ABNORMAL LOW (ref >60)
Glucose, Bld: 75 mg/dL (ref 70–99)
Potassium: 4.2 mmol/L (ref 3.5–5.1)
Sodium: 136 mmol/L (ref 135–145)

## 2019-12-19 LAB — TROPONIN I (HIGH SENSITIVITY)
Troponin I (High Sensitivity): 6 ng/L (ref <18)
Troponin I (High Sensitivity): 7 ng/L (ref <18)

## 2019-12-19 LAB — D-DIMER, QUANTITATIVE: D-Dimer, Quant: 0.85 ug/mL-FEU — ABNORMAL HIGH (ref 0.00–0.50)

## 2019-12-19 NOTE — ED Triage Notes (Signed)
Patient arrives to ED with complaints of chest pain and hemoptysis x5 days. Pt states the CP is intermittent and radiates to her back. Hemoptysis comes and goes for the past five days but today is constant. SOB has worsened from baseline.

## 2019-12-19 NOTE — ED Provider Notes (Signed)
85 year old female with history of CHF, DM, HTN comes in for 3 day history of chest pain. Has baseline shortness of breath that has not changed. Denies nausea/vomiting. States she occasionally has "blood in the mouth". Unsure if hemoptysis/blood tinged sputum. Chest pain can radiate to the back  EKG sinus bradycardia with PVC, 58bpm, T wave inversion to lead III that is new compared to prior. V1 T wave inversion that is present on last EKG. No ST depression/elevation.   Given history/age with chest pain, discharged in stable condition to the ED for further evaluation     Ok Edwards, PA-C 12/19/19 1422

## 2019-12-19 NOTE — ED Provider Notes (Signed)
[facility] EMERGENCY DEPARTMENT Provider Note   CSN: [id number] Arrival date & time: 12/19/19  1511     History Chief Complaint  Patient presents with  . Hemoptysis  . Chest Pain    Joyce Gilmore is a 85 y.o. female.   Chest Pain Pain location:  Substernal area Pain quality: crushing   Pain radiates to:  Does not radiate Pain severity:  Mild Onset quality:  Gradual Duration:  3 hours Timing:  Constant Progression:  Resolved Chronicity:  New Associated symptoms: no abdominal pain, no altered mental status, no anxiety, no back pain, no cough, no dizziness, no fever, no headache, no lower extremity edema, no nausea, no numbness, no palpitations, no shortness of breath, no vomiting and no weakness        Past Medical History:  Diagnosis Date  . Arthritis   . CHF (congestive heart failure) (HCC)    diastolic  . Chronic kidney disease    hx of kidney stones,   . Depression    hx of   . Diabetes mellitus without complication ([place])   . Ganglion, left ankle and foot   . Hearing loss of both ears   . Hypertension   . MRSA infection    Oct 13 - Nov 13  . Osteoporosis   . Tinnitus of both ears     Patient Active Problem List   Diagnosis Date Noted  . Chronic kidney disease 05/07/2018  . GERD (gastroesophageal reflux disease) 03/10/2017  . Uses hearing aid 09/12/2016  . Asthmatic bronchitis 03/11/2016  . Chronic diastolic heart failure ([place]) 10/12/2015  . Seasonal allergies 11/21/2014  . DDD (degenerative disc disease), lumbar 10/11/2014  . H/O compression fracture of spine 10/11/2014  . Pulmonary hypertension ([place])   . Hepatic cirrhosis ([place]) 09/24/2014  . SIADH (syndrome of inappropriate ADH production) ([place]) 06/13/2014  . Insomnia 12/28/2013  . Anemia 07/06/2013  . HLD (hyperlipidemia) 06/27/2013  . Osteoporosis 07/26/2012  . Hyponatremia 05/16/2011  . Type 2 diabetes mellitus with diabetic chronic kidney disease ([place]) 08/17/2006  .  Essential hypertension 08/17/2006  . DIVERTICULOSIS, COLON 08/17/2006    Past Surgical History:  Procedure Laterality Date  . BACK SURGERY     history restored after error with record merge      . INCISION AND DRAINAGE HIP  12/22/2011   Procedure: IRRIGATION AND DEBRIDEMENT HIP;  Surgeon: Mcarthur Rossetti, MD;  Location: [facility];  Service: Orthopedics;  Laterality: Right;  Irrigation and debridement right hip  . INCISION AND DRAINAGE HIP  12/27/2011   Procedure: IRRIGATION AND DEBRIDEMENT HIP;  Surgeon: Mcarthur Rossetti, MD;  Location: [facility];  Service: Orthopedics;  Laterality: Right;  Repeat irrigation and debridement  . JOINT REPLACEMENT     bilateral knee, right hip   . TOTAL HIP REVISION  12/05/2011   Procedure: TOTAL HIP REVISION;  Surgeon: Mcarthur Rossetti, MD;  Location: [facility];  Service: Orthopedics;  Laterality: Right;  Right Hip Revision Arthroplasty to Total Hip, Excision of Old Implant     OB History   No obstetric history on file.     Family History  Problem Relation Age of Onset  . Hypertension Mother   . Colon cancer Neg Hx   . Breast cancer Neg Hx     Social History   Tobacco Use  . Smoking status: Never Smoker  . Smokeless tobacco: Never Used  Vaping Use  . Vaping Use: Never used  Substance Use Topics  .  Alcohol use: No    Alcohol/week: 0.0 standard drinks  . Drug use: No    Home Medications Prior to Admission medications   Medication Sig Start Date End Date Taking? Authorizing Provider  Acetaminophen (TYLENOL PO) Take 1,000 tablets by mouth every 6 (six) hours as needed (pain/headache).    Yes [provider]  albuterol (PROVENTIL) (2.5 MG/3ML) 0.083% nebulizer solution Take 3 mLs (2.5 mg total) by nebulization every 6 (six) hours as needed for wheezing or shortness of breath. 06/27/19  Yes [place], Modena Nunnery, MD  albuterol (VENTOLIN HFA) 108 (90 Base) MCG/ACT inhaler Inhale 1-2 puffs into the lungs every 4 (four) hours as needed  for wheezing or shortness of breath. 06/27/19  Yes [place], Modena Nunnery, MD  amLODipine (NORVASC) 5 MG tablet Take 1 tablet (5 mg total) by mouth daily. 06/27/19  Yes [place], Modena Nunnery, MD  Fluticasone Furoate (ARNUITY ELLIPTA) 100 MCG/ACT AEPB Inhale 1 puff into the lungs daily. 06/27/19  Yes [place], Modena Nunnery, MD  furosemide (LASIX) 20 MG tablet TAKE 1 TABLET BY MOUTH EVERY DAY 09/08/19  Yes [place], Modena Nunnery, MD  insulin glargine (LANTUS SOLOSTAR) 100 UNIT/ML Solostar Pen INJECT  38 UNITS UNDER THE SKIN AT BEDTIME 10/27/19  Yes [place], Modena Nunnery, MD  lansoprazole (PREVACID) 30 MG capsule TAKE ONE CAPSULE BY MOUTH DAILY AT 12 NOON Patient taking differently: Take 30 mg by mouth daily.  12/19/19  Yes [place], Modena Nunnery, MD  lisinopril (ZESTRIL) 10 MG tablet Take 1 tablet (10 mg total) by mouth daily. 06/27/19  Yes [place], Modena Nunnery, MD  loratadine (CLARITIN) 10 MG tablet TAKE 1 TABLET(10 MG) BY MOUTH DAILY AS NEEDED FOR ALLERGIES 11/15/19  Yes [place], Modena Nunnery, MD  metoprolol tartrate (LOPRESSOR) 25 MG tablet Take 0.5 tablets (12.5 mg total) by mouth 2 (two) times daily. 06/27/19  Yes [place], Modena Nunnery, MD  NOVOLOG FLEXPEN 100 UNIT/ML FlexPen ADMINISTER 5 TO 15 UNITS UNDER THE SKIN THREE TIMES DAILY WITH MEALS PER SLIDING SCALE Patient taking differently: Inject 15 Units into the skin 3 (three) times daily with meals.  07/26/19  Yes [place], Modena Nunnery, MD  pravastatin (PRAVACHOL) 20 MG tablet Take 1 tablet (20 mg total) by mouth daily. 06/27/19  Yes [place], Modena Nunnery, MD  sucralfate (CARAFATE) 1 g tablet Take 1 g by mouth 4 (four) times daily.   Yes [provider]  traMADol (ULTRAM) 50 MG tablet TAKE 1 TABLET(50 MG) BY MOUTH TWICE DAILY AS NEEDED FOR CHRONIC PAIN Patient taking differently: Take 50 mg by mouth 2 (two) times daily as needed for moderate pain.  10/07/19  Yes , Modena Nunnery, MD  Accu-Chek Softclix Lancets lancets USE AS DIRECTED 05/06/19   Alycia Rossetti, MD  B-D ULTRAFINE III SHORT PEN 31G X  8 MM MISC USE AS DIRECTED 10/06/19   Alycia Rossetti, MD  blood glucose meter kit and supplies KIT Dispense based on patient and insurance preference. Use up to four times daily as directed. (FOR ICD-9 250.00, 250.01). 04/25/14   Dena Billet B, PA-C  fluticasone (FLONASE) 50 MCG/ACT nasal spray SHAKE LIQUID AND USE 2 SPRAYS IN [facility] NOSTRIL DAILY Patient not taking: Reported on 12/19/2019 06/27/19   Alycia Rossetti, MD  glucose blood (ACCU-CHEK AVIVA PLUS) test strip USE TO CHECK BLOOD SUGAR FOUR TIMES DAILY AS DIRECTED 12/10/18   Alycia Rossetti, MD  MITIGARE 0.6 MG CAPS TAKE 1 CAPSULE BY MOUTH DAILY 12/14/19   Pete Pelt, PA-C  omeprazole (PRILOSEC)  40 MG capsule TAKE 1 CAPSULE(40 MG) BY MOUTH DAILY Patient taking differently: Take 40 mg by mouth daily.  11/02/19   Alycia Rossetti, MD  polyvinyl alcohol (ARTIFICIAL TEARS) 1.4 % ophthalmic solution Place 1 drop into both eyes as needed for dry eyes. Patient not taking: Reported on 12/19/2019 10/12/17   Alycia Rossetti, MD    Allergies    Bactrim [sulfamethoxazole-trimethoprim], Rifampin, and Vancomycin  Review of Systems   Review of Systems  Constitutional: Negative for chills and fever.  HENT: Negative for congestion, facial swelling, mouth sores, nosebleeds, rhinorrhea and sore throat.        Blood in mouth   Respiratory: Negative for cough and shortness of breath.   Cardiovascular: Positive for chest pain. Negative for palpitations and leg swelling.  Gastrointestinal: Negative for abdominal pain, nausea and vomiting.  Genitourinary: Negative for difficulty urinating.  Musculoskeletal: Negative for back pain and neck pain.  Skin: Negative for rash.  Neurological: Negative for dizziness, weakness, numbness and headaches.  All other systems reviewed and are negative.   Physical Exam Updated Vital Signs BP (!) 122/102 (BP Location: Left Arm)   Pulse 84   Temp 98.4 F (36.9 C) (Oral)   Resp 20   SpO2 93%   Physical  Exam Vitals reviewed.  Constitutional:      General: She is not in acute distress.    Appearance: Normal appearance. She is well-developed. She is not toxic-appearing.  HENT:     Head: Normocephalic and atraumatic.     Nose: Nose normal.     Mouth/Throat:     Mouth: Mucous membranes are moist.     Pharynx: Oropharynx is clear.     Comments: Small purple raised lesion on roof of mouth (4 mm),  No active bleeding Eyes:     Conjunctiva/sclera: Conjunctivae normal.  Cardiovascular:     Rate and Rhythm: Normal rate.     Heart sounds: Normal heart sounds.  Pulmonary:     Effort: Pulmonary effort is normal. No respiratory distress.     Breath sounds: Normal breath sounds. No wheezing or rales.     Comments: Saturating appropriately on RA Abdominal:     General: Abdomen is flat.     Palpations: Abdomen is soft.     Tenderness: There is no abdominal tenderness.  Musculoskeletal:     Cervical back: Neck supple.     Right lower leg: No edema.     Left lower leg: No edema.  Skin:    General: Skin is warm and dry.  Neurological:     Mental Status: She is alert.  Psychiatric:        Mood and Affect: Mood normal.        Behavior: Behavior normal.     ED Results / Procedures / Treatments   Labs (all labs ordered are listed, but only abnormal results are displayed) Labs Reviewed  BASIC METABOLIC PANEL - Abnormal; Notable for the following components:      Result Value   Creatinine, Ser 1.04 (*)    GFR, Estimated 53 (*)    All other components within normal limits  CBC - Abnormal; Notable for the following components:   Hemoglobin 11.7 (*)    MCH 25.4 (*)    All other components within normal limits  D-DIMER, QUANTITATIVE (NOT AT [facility]) - Abnormal; Notable for the following components:   D-Dimer, Quant 0.85 (*)    All other components within normal limits  PROTIME-INR  TROPONIN I (  HIGH SENSITIVITY)  TROPONIN I (HIGH SENSITIVITY)    EKG None  Radiology DG Chest 2  View  Result Date: 12/19/2019 CLINICAL DATA:  Chest pain EXAM: CHEST - 2 VIEW COMPARISON:  05/07/2018 FINDINGS: No focal opacity or pleural effusion. Stable cardiomediastinal silhouette. No pneumothorax. Severe compression deformity of mid to lower thoracic vertebra with progressed loss of height since comparison radiograph. IMPRESSION: No active cardiopulmonary disease. Severe compression deformity of mid to lower thoracic vertebra with progressed loss of height since comparison radiograph from 2020. Electronically Signed   By: Jasmine Pang M.D.   On: 12/19/2019 15:54    Procedures Procedures (including critical care time)  Medications Ordered in ED Medications - No data to display  ED Course  I have reviewed the triage vital signs and the nursing notes.  Pertinent labs & imaging results that were available during my care of the patient were reviewed by me and considered in my medical decision making (see chart for details).    MDM Rules/Calculators/A&P                           Medical Decision Making: EMAYA PRESTON is a 85 y.o. female who presented to the ED today with blood in mouth and CP this morning. Pt reports no coughing up of blood on my questioning, she denies coughing at all or SOB. Pt reports she has had blood accumulate in her mouth, happens throughout the day and at night, she spits it out and sometimes is comes back. Pt reports being seen by PCP and told that it was coming from a vessel in her mouth.  Past medical history significant for DM, CHF, arthritis, depression, HTN, cirrhosis Reviewed and confirmed nursing documentation for past medical history, family history, social history.  Per PCP documentation, pt has presented for mouth bleeding previously and found to have a bleeding vessel on palate, given ENT referral.   On my initial exam, the pt was in NAD, normal WOB, sating 99% on RA, able to speak in full sentences, lungs CTAB  Roof of mouth shows small raised  purple lesion, hemostatic  D-dimer 0.85, age adjusted, PE unlikely. Pt has no hypoxia, SOB, do not suspect PE.  CXR shows No active cardiopulmonary disease. Severe compression deformity of mid to lower thoracic vertebra with progressed loss of height since comparison radiograph from 2020. EKG unremarkable, normal sinus rhythm, nonspecific t wave abnormalities are relatively stable compared to prior and Trop x2 negative do not suspect ACS.   Consults: none performed All radiology and laboratory studies reviewed independently and with my attending physician, agree with reading provided by radiologist unless otherwise noted.  Source of bleeding likely roof of mouth, possible blood vessel vs sore from denture irritation vs cancer. Pt advised on importance of ENT follow up, pt has ENT apt Nov 1st.   Upon reassessing patient, patient was in NAD, no active bleeding, normal work of breathing.  Based on the above findings, I believe patient is hemodynamically stable for discharge  Patient/and family educated about specific return precautions for given chief complaint and symptoms.  Patient/and family educated about follow-up with PCP.  Patient/and family expressed understanding of return precautions and need for follow-up.  Patient discharged.   The above care was discussed with and agreed upon by my attending physician.    Emergency Department Medication Summary:  Medications - No data to display      Final Clinical Impression(s) / ED Diagnoses Final  diagnoses:  Mouth bleeding    Rx / DC Orders ED Discharge Orders    None       Roosevelt Locks, MD 12/19/19 Mittie Bodo, MD 12/19/19 2316

## 2019-12-19 NOTE — ED Triage Notes (Signed)
Pt c/o center chest pain radiating through to back x3 days with SOB. No distress noted at this time, pt speaking in complete sentences.

## 2019-12-19 NOTE — Discharge Instructions (Addendum)
85 year old female with history of CHF, DM, HTN comes in for 3 day history of chest pain. Has baseline shortness of breath that has not changed. Denies nausea/vomiting. States she occasionally has "blood in the mouth". Unsure if hemoptysis/blood tinged sputum. Chest pain can radiate to the back  EKG sinus bradycardia with PVC, 58bpm, T wave inversion to lead III that is new compared to prior. V1 T wave inversion that is present on last EKG. No ST depression/elevation.   Given history/age with chest pain, discharged in stable condition to the ED for further evaluation

## 2019-12-20 ENCOUNTER — Telehealth: Payer: Self-pay | Admitting: *Deleted

## 2019-12-20 NOTE — Telephone Encounter (Signed)
This call was conducted through an interpreter service [phone] code: [phone]).  Transition Care Management Unsuccessful Follow-up Telephone Call  Date of discharge and from where:  12/19/19 [facility]  Attempts:  1st Attempt  Reason for unsuccessful TCM follow-up call:  Left voice message

## 2019-12-22 NOTE — Telephone Encounter (Signed)
Message will be closed as we are outside of the 72 hour window to contact patient.

## 2019-12-26 DIAGNOSIS — K920 Hematemesis: Secondary | ICD-10-CM | POA: Diagnosis not present

## 2019-12-26 DIAGNOSIS — R070 Pain in throat: Secondary | ICD-10-CM | POA: Diagnosis not present

## 2019-12-26 DIAGNOSIS — D1809 Hemangioma of other sites: Secondary | ICD-10-CM | POA: Diagnosis not present

## 2019-12-28 ENCOUNTER — Other Ambulatory Visit: Payer: Self-pay | Admitting: Otolaryngology

## 2019-12-29 ENCOUNTER — Encounter (HOSPITAL_BASED_OUTPATIENT_CLINIC_OR_DEPARTMENT_OTHER): Payer: Self-pay | Admitting: Otolaryngology

## 2019-12-29 ENCOUNTER — Other Ambulatory Visit: Payer: Self-pay | Admitting: Physician Assistant

## 2019-12-29 ENCOUNTER — Other Ambulatory Visit: Payer: Self-pay

## 2019-12-30 ENCOUNTER — Other Ambulatory Visit (HOSPITAL_COMMUNITY)
Admission: RE | Admit: 2019-12-30 | Discharge: 2019-12-30 | Disposition: A | Discharge status: Home / Self Care | Payer: Medicaid Other | Source: Ambulatory Visit | Attending: Otolaryngology | Admitting: Otolaryngology

## 2019-12-30 ENCOUNTER — Encounter (HOSPITAL_BASED_OUTPATIENT_CLINIC_OR_DEPARTMENT_OTHER)
Admission: RE | Admit: 2019-12-30 | Discharge: 2019-12-30 | Disposition: A | Discharge status: Home / Self Care | Payer: Medicaid Other | Source: Ambulatory Visit | Attending: Otolaryngology | Admitting: Otolaryngology

## 2019-12-30 DIAGNOSIS — Z20822 Contact with and (suspected) exposure to covid-19: Secondary | ICD-10-CM | POA: Diagnosis not present

## 2019-12-30 DIAGNOSIS — Z01812 Encounter for preprocedural laboratory examination: Secondary | ICD-10-CM | POA: Insufficient documentation

## 2019-12-30 LAB — BASIC METABOLIC PANEL
Anion gap: 8 (ref 5–15)
BUN: 15 mg/dL (ref 8–23)
CO2: 25 mmol/L (ref 22–32)
Calcium: 9.4 mg/dL (ref 8.9–10.3)
Chloride: 100 mmol/L (ref 98–111)
Creatinine, Ser: 0.9 mg/dL (ref 0.44–1.00)
GFR, Estimated: >60 mL/min (ref >60)
Glucose, Bld: 126 mg/dL — ABNORMAL HIGH (ref 70–99)
Potassium: 4.7 mmol/L (ref 3.5–5.1)
Sodium: 133 mmol/L — ABNORMAL LOW (ref 135–145)

## 2019-12-30 LAB — SARS CORONAVIRUS 2 (TAT 6-24 HRS): SARS Coronavirus 2: NEGATIVE

## 2020-01-01 ENCOUNTER — Other Ambulatory Visit: Payer: Self-pay | Admitting: Family Medicine

## 2020-01-03 ENCOUNTER — Ambulatory Visit (HOSPITAL_BASED_OUTPATIENT_CLINIC_OR_DEPARTMENT_OTHER): Payer: Medicaid Other | Admitting: Anesthesiology

## 2020-01-03 ENCOUNTER — Other Ambulatory Visit: Payer: Self-pay

## 2020-01-03 ENCOUNTER — Encounter (HOSPITAL_BASED_OUTPATIENT_CLINIC_OR_DEPARTMENT_OTHER)
Admission: RE | Disposition: A | Discharge status: Home / Self Care | Payer: Self-pay | Source: Ambulatory Visit | Attending: Otolaryngology

## 2020-01-03 ENCOUNTER — Ambulatory Visit (HOSPITAL_BASED_OUTPATIENT_CLINIC_OR_DEPARTMENT_OTHER)
Admission: RE | Admit: 2020-01-03 | Discharge: 2020-01-03 | Disposition: A | Discharge status: Home / Self Care | Payer: Medicaid Other | Source: Ambulatory Visit | Attending: Otolaryngology | Admitting: Otolaryngology

## 2020-01-03 ENCOUNTER — Other Ambulatory Visit: Payer: Self-pay | Admitting: Family Medicine

## 2020-01-03 DIAGNOSIS — I5030 Unspecified diastolic (congestive) heart failure: Secondary | ICD-10-CM | POA: Insufficient documentation

## 2020-01-03 DIAGNOSIS — E785 Hyperlipidemia, unspecified: Secondary | ICD-10-CM | POA: Diagnosis not present

## 2020-01-03 DIAGNOSIS — K1379 Other lesions of oral mucosa: Secondary | ICD-10-CM | POA: Diagnosis not present

## 2020-01-03 DIAGNOSIS — J45909 Unspecified asthma, uncomplicated: Secondary | ICD-10-CM | POA: Insufficient documentation

## 2020-01-03 DIAGNOSIS — E871 Hypo-osmolality and hyponatremia: Secondary | ICD-10-CM | POA: Diagnosis not present

## 2020-01-03 DIAGNOSIS — I110 Hypertensive heart disease with heart failure: Secondary | ICD-10-CM | POA: Insufficient documentation

## 2020-01-03 DIAGNOSIS — Z6834 Body mass index (BMI) 34.0-34.9, adult: Secondary | ICD-10-CM | POA: Insufficient documentation

## 2020-01-03 DIAGNOSIS — E669 Obesity, unspecified: Secondary | ICD-10-CM | POA: Insufficient documentation

## 2020-01-03 DIAGNOSIS — E119 Type 2 diabetes mellitus without complications: Secondary | ICD-10-CM | POA: Diagnosis not present

## 2020-01-03 DIAGNOSIS — M810 Age-related osteoporosis without current pathological fracture: Secondary | ICD-10-CM | POA: Diagnosis not present

## 2020-01-03 DIAGNOSIS — Q351 Cleft hard palate: Secondary | ICD-10-CM | POA: Insufficient documentation

## 2020-01-03 DIAGNOSIS — R229 Localized swelling, mass and lump, unspecified: Secondary | ICD-10-CM | POA: Diagnosis not present

## 2020-01-03 DIAGNOSIS — D1809 Hemangioma of other sites: Secondary | ICD-10-CM | POA: Insufficient documentation

## 2020-01-03 HISTORY — PX: MASS EXCISION: SHX2000

## 2020-01-03 LAB — GLUCOSE, CAPILLARY
Glucose-Capillary: 103 mg/dL — ABNORMAL HIGH (ref 70–99)
Glucose-Capillary: 106 mg/dL — ABNORMAL HIGH (ref 70–99)

## 2020-01-03 SURGERY — EXCISION MASS
Anesthesia: General | Site: Mouth

## 2020-01-03 MED ORDER — FENTANYL CITRATE (PF) 100 MCG/2ML IJ SOLN
INTRAMUSCULAR | Status: AC
  Filled 2020-01-03: qty 2

## 2020-01-03 MED ORDER — ACETAMINOPHEN 500 MG PO TABS
1000.0000 mg | ORAL_TABLET | Freq: Once | ORAL | Status: AC
  Administered 2020-01-03: 1000 mg via ORAL

## 2020-01-03 MED ORDER — CEFAZOLIN SODIUM 1 G IJ SOLR
INTRAMUSCULAR | Status: AC
  Filled 2020-01-03: qty 20

## 2020-01-03 MED ORDER — OXYCODONE-ACETAMINOPHEN 5-325 MG PO TABS
1.0000 | ORAL_TABLET | ORAL | 0 refills | Status: AC | PRN
Start: 2020-01-03 — End: 2020-01-06

## 2020-01-03 MED ORDER — EPHEDRINE SULFATE 50 MG/ML IJ SOLN
INTRAMUSCULAR | Status: DC | PRN
  Administered 2020-01-03: 20 mg via INTRAVENOUS

## 2020-01-03 MED ORDER — AMOXICILLIN 875 MG PO TABS: 875.0000 mg | ORAL_TABLET | Freq: Two times a day (BID) | ORAL | 0 refills | Status: AC

## 2020-01-03 MED ORDER — EPHEDRINE 5 MG/ML INJ
INTRAVENOUS | Status: AC
  Filled 2020-01-03: qty 10

## 2020-01-03 MED ORDER — DEXAMETHASONE SODIUM PHOSPHATE 10 MG/ML IJ SOLN
INTRAMUSCULAR | Status: DC | PRN
  Administered 2020-01-03: 5 mg via INTRAVENOUS

## 2020-01-03 MED ORDER — FENTANYL CITRATE (PF) 100 MCG/2ML IJ SOLN: 25.0000 ug | INTRAMUSCULAR | Status: DC | PRN

## 2020-01-03 MED ORDER — FENTANYL CITRATE (PF) 100 MCG/2ML IJ SOLN
INTRAMUSCULAR | Status: DC | PRN
  Administered 2020-01-03: 25 ug via INTRAVENOUS

## 2020-01-03 MED ORDER — SUCCINYLCHOLINE CHLORIDE 20 MG/ML IJ SOLN
INTRAMUSCULAR | Status: DC | PRN
  Administered 2020-01-03: 80 mg via INTRAVENOUS

## 2020-01-03 MED ORDER — OXYCODONE HCL 5 MG/5ML PO SOLN: 5.0000 mg | Freq: Once | ORAL | Status: DC | PRN

## 2020-01-03 MED ORDER — LIDOCAINE 2% (20 MG/ML) 5 ML SYRINGE
INTRAMUSCULAR | Status: AC
  Filled 2020-01-03: qty 5

## 2020-01-03 MED ORDER — CEFAZOLIN SODIUM-DEXTROSE 2-3 GM-%(50ML) IV SOLR
INTRAVENOUS | Status: DC | PRN
  Administered 2020-01-03: 2 g via INTRAVENOUS

## 2020-01-03 MED ORDER — ONDANSETRON HCL 4 MG/2ML IJ SOLN: 4.0000 mg | Freq: Once | INTRAMUSCULAR | Status: DC | PRN

## 2020-01-03 MED ORDER — OXYCODONE HCL 5 MG PO TABS: 5.0000 mg | ORAL_TABLET | Freq: Once | ORAL | Status: DC | PRN

## 2020-01-03 MED ORDER — LIDOCAINE-EPINEPHRINE 1 %-1:100000 IJ SOLN
INTRAMUSCULAR | Status: DC | PRN
  Administered 2020-01-03: 1 mL

## 2020-01-03 MED ORDER — ALBUTEROL SULFATE HFA 108 (90 BASE) MCG/ACT IN AERS
INHALATION_SPRAY | RESPIRATORY_TRACT | Status: DC | PRN
  Administered 2020-01-03: 8 via RESPIRATORY_TRACT

## 2020-01-03 MED ORDER — ONDANSETRON HCL 4 MG/2ML IJ SOLN
INTRAMUSCULAR | Status: AC
  Filled 2020-01-03: qty 2

## 2020-01-03 MED ORDER — PROPOFOL 10 MG/ML IV BOLUS
INTRAVENOUS | Status: DC | PRN
  Administered 2020-01-03: 130 mg via INTRAVENOUS

## 2020-01-03 MED ORDER — ACETAMINOPHEN 500 MG PO TABS
ORAL_TABLET | ORAL | Status: AC
  Filled 2020-01-03: qty 2

## 2020-01-03 MED ORDER — LIDOCAINE 2% (20 MG/ML) 5 ML SYRINGE
INTRAMUSCULAR | Status: DC | PRN
  Administered 2020-01-03: 40 mg via INTRAVENOUS

## 2020-01-03 MED ORDER — LACTATED RINGERS IV SOLN: INTRAVENOUS | Status: DC

## 2020-01-03 MED ORDER — ONDANSETRON HCL 4 MG/2ML IJ SOLN
INTRAMUSCULAR | Status: DC | PRN
  Administered 2020-01-03: 4 mg via INTRAVENOUS

## 2020-01-03 MED ORDER — PROPOFOL 10 MG/ML IV BOLUS
INTRAVENOUS | Status: AC
  Filled 2020-01-03: qty 20

## 2020-01-03 SURGICAL SUPPLY — 38 items
BLADE SURG 15 STRL LF DISP TIS (BLADE) ×1 IMPLANT
BLADE SURG 15 STRL SS (BLADE) ×3
CANISTER SUCT 1200ML W/VALVE (MISCELLANEOUS) IMPLANT
CORD BIPOLAR FORCEPS 12FT (ELECTRODE) IMPLANT
COVER BACK TABLE 60X90IN (DRAPES) ×3 IMPLANT
COVER MAYO STAND STRL (DRAPES) ×3 IMPLANT
COVER WAND RF STERILE (DRAPES) IMPLANT
ELECT COATED BLADE 2.86 ST (ELECTRODE) ×3 IMPLANT
ELECT NEEDLE BLADE 2-5/6 (NEEDLE) IMPLANT
ELECT REM PT RETURN 9FT ADLT (ELECTROSURGICAL) ×3
ELECT REM PT RETURN 9FT PED (ELECTROSURGICAL)
ELECTRODE REM PT RETRN 9FT PED (ELECTROSURGICAL) IMPLANT
ELECTRODE REM PT RTRN 9FT ADLT (ELECTROSURGICAL) ×1 IMPLANT
FORCEPS BIPOLAR SPETZLER 8 1.0 (NEUROSURGERY SUPPLIES) IMPLANT
GLOVE BIO SURGEON STRL SZ 6.5 (GLOVE) ×2 IMPLANT
GLOVE BIO SURGEON STRL SZ7.5 (GLOVE) ×3 IMPLANT
GLOVE BIO SURGEONS STRL SZ 6.5 (GLOVE) ×1
GOWN STRL REUS W/ TWL LRG LVL3 (GOWN DISPOSABLE) ×2 IMPLANT
GOWN STRL REUS W/TWL LRG LVL3 (GOWN DISPOSABLE) ×6
NEEDLE PRECISIONGLIDE 27X1.5 (NEEDLE) ×3 IMPLANT
PACK BASIN DAY SURGERY FS (CUSTOM PROCEDURE TRAY) ×3 IMPLANT
PENCIL FOOT CONTROL (ELECTRODE) IMPLANT
PENCIL SMOKE EVACUATOR (MISCELLANEOUS) ×3 IMPLANT
SHEET MEDIUM DRAPE 40X70 STRL (DRAPES) IMPLANT
SLEEVE SCD COMPRESS KNEE MED (MISCELLANEOUS) ×3 IMPLANT
SPONGE NEURO XRAY DETECT 1X3 (DISPOSABLE) ×3 IMPLANT
SPONGE TONSIL TAPE 1.25 RFD (DISPOSABLE) IMPLANT
SUCTION FRAZIER HANDLE 10FR (MISCELLANEOUS)
SUCTION TUBE FRAZIER 10FR DISP (MISCELLANEOUS) IMPLANT
SUT CHROMIC 5 0 P 3 (SUTURE) IMPLANT
SUT SILK 3 0 SH 30 (SUTURE) IMPLANT
SYR CONTROL 10ML LL (SYRINGE) ×3 IMPLANT
TOWEL GREEN STERILE FF (TOWEL DISPOSABLE) ×3 IMPLANT
TUBE CONNECTING 20'X1/4 (TUBING) ×1
TUBE CONNECTING 20X1/4 (TUBING) ×2 IMPLANT
TUBE SALEM SUMP 12R W/ARV (TUBING) IMPLANT
TUBE SALEM SUMP 16 FR W/ARV (TUBING) IMPLANT
YANKAUER SUCT BULB TIP NO VENT (SUCTIONS) ×3 IMPLANT

## 2020-01-03 NOTE — Op Note (Signed)
DATE OF PROCEDURE:  01/03/2020                              OPERATIVE REPORT  SURGEON:  Leta Baptist, MD  PREOPERATIVE DIAGNOSES: 1. Hard palate mass  POSTOPERATIVE DIAGNOSES: 1. Hard palate mass  PROCEDURE PERFORMED: Excision of hard palate mass  ANESTHESIA:  General endotracheal tube anesthesia.  COMPLICATIONS:  None.  ESTIMATED BLOOD LOSS:  Minimal.  INDICATION FOR PROCEDURE:  Joyce Gilmore is a 85 y.o. female with a history of recurrent oral bleeding.  On examination, the patient was noted to have a hemangioma-like lesion on her hard palate.  The lesion was likely the source of her recurrent bleeding.  Based on the above findings, the decision was made for the patient to undergo the excision procedure. Likelihood of success in reducing symptoms was also discussed.  The risks, benefits, alternatives, and details of the procedure were discussed with the patient.  Questions were invited and answered.  Informed consent was obtained.  DESCRIPTION:  The patient was taken to the operating room and placed supine on the operating table.  General endotracheal tube anesthesia was administered by the anesthesiologist.  The patient was positioned and prepped and draped in a standard fashion for oral surgery.  The oral cavity was exposed with a Molt retractor.  Examination of the hard palate revealed a 5 mm vascular lesion.  The appearance was suggestive of hemangioma.  1% lidocaine with 1-100,000 epinephrine was infiltrated around the lesion.  An elliptical incision was made to excise the entire lesion.  Hemostasis was achieved with Bovie electrocautery device.  The specimen was sent to the pathology department for permanent histologic identification.  The care of the patient was turned over to the anesthesiologist.  The patient was awakened from anesthesia without difficulty.  The patient was extubated and transferred to the recovery room in good condition.  OPERATIVE FINDINGS:  A 5 mm hard palate  lesion.  SPECIMEN: Hard palate lesion.  FOLLOWUP CARE:  The patient will be discharged home once awake and alert.  The patient will be placed on Percocet as needed for pain.  Joyce Gilmore 01/03/2020 11:19 AM

## 2020-01-03 NOTE — Anesthesia Procedure Notes (Signed)
Procedure Name: Intubation Date/Time: 01/03/2020 11:03 AM Performed by: Lavonia Dana, CRNA Pre-anesthesia Checklist: Patient identified, Emergency Drugs available, Suction available and Patient being monitored Patient Re-evaluated:Patient Re-evaluated prior to induction Oxygen Delivery Method: Circle system utilized Preoxygenation: Pre-oxygenation with 100% oxygen Induction Type: IV induction Ventilation: Mask ventilation without difficulty Laryngoscope Size: Mac and 3 Grade View: Grade I Tube type: Oral Number of attempts: 1 Airway Equipment and Method: Stylet and Oral airway Placement Confirmation: ETT inserted through vocal cords under direct vision,  positive ETCO2 and breath sounds checked- equal and bilateral Secured at: 21 cm Tube secured with: Tape Dental Injury: Teeth and Oropharynx as per pre-operative assessment

## 2020-01-03 NOTE — Anesthesia Preprocedure Evaluation (Addendum)
Anesthesia Evaluation  Patient identified by MRN, date of birth, ID band Patient awake    Reviewed: Allergy & Precautions, NPO status , Patient's Chart, lab work & pertinent test results, reviewed documented beta blocker date and time   Airway Mallampati: II  TM Distance: >3 FB Neck ROM: Full    Dental  (+) Edentulous Lower, Partial Upper,    Pulmonary asthma ,    Pulmonary exam normal breath sounds clear to auscultation       Cardiovascular hypertension, Pt. on medications and Pt. on home beta blockers +CHF (grade 1 diastolic dysfunction)  Normal cardiovascular exam Rhythm:Regular Rate:Normal  Echo 2017 - Left ventricle: The cavity size was normal. Wall thickness was  normal. Systolic function was normal. The estimated ejection  fraction was in the range of 60% to 65%. Wall motion was normal;  there were no regional wall motion abnormalities. Doppler  parameters are consistent with abnormal left ventricular  relaxation (grade 1 diastolic dysfunction).    Neuro/Psych PSYCHIATRIC DISORDERS Depression negative neurological ROS     GI/Hepatic Neg liver ROS, GERD  Medicated and Controlled,  Endo/Other  diabetes, Well Controlled, Type 2, Insulin DependentObesity BMI 36 Last a1c 7.0  Renal/GU negative Renal ROS  negative genitourinary   Musculoskeletal  (+) Arthritis , Osteoarthritis,    Abdominal (+) + obese,   Peds  Hematology negative hematology ROS (+)   Anesthesia Other Findings Hemangioma of palate   Reproductive/Obstetrics negative OB ROS                            Anesthesia Physical Anesthesia Plan  ASA: III  Anesthesia Plan: General   Post-op Pain Management:    Induction: Intravenous  PONV Risk Score and Plan: 3 and Ondansetron, Dexamethasone and Treatment may vary due to age or medical condition  Airway Management Planned: Nasal ETT  Additional Equipment:  None  Intra-op Plan:   Post-operative Plan: Extubation in OR  Informed Consent: I have reviewed the patients History and Physical, chart, labs and discussed the procedure including the risks, benefits and alternatives for the proposed anesthesia with the patient or authorized representative who has indicated his/her understanding and acceptance.     Dental advisory given and Consent reviewed with POA  Plan Discussed with: CRNA  Anesthesia Plan Comments:       Anesthesia Quick Evaluation

## 2020-01-03 NOTE — H&P (Signed)
Cc: Bleeding from mouth  HPI: The patient is a 85 y/o female who presents today with a family member for evaluation of bleeding from her mouth. The patient is seen in consultation requested by Dr. Vic Blackbird. The patient has woken up a few times in the past few weeks with blood in her mouth. She tastes something salty and then spits it out. She denies nosebleeds or coughing up blood. She was noted to have a small lesion or hypervascular area along her hard palate. The patient denies pain or cough.  She has no history of tobacco use. Previous ENT surgery is denied.   The patient's review of systems (constitutional, eyes, ENT, cardiovascular, respiratory, GI, musculoskeletal, skin, neurologic, psychiatric, endocrine, hematologic, allergic) is noted in the ROS questionnaire.  It is reviewed with the patient and her family.   Family health history: Diabetes.  Major events: None.  Ongoing medical problems: Asthma.  Social history: The patient is single. She denies the use of tobacco, alcohol or illegal drugs.   Exam: General: Communicates without difficulty, well nourished, no acute distress. Head: Normocephalic, no evidence injury, no tenderness, facial buttresses intact without stepoff. Eyes: PERRL, EOMI.  No scleral icterus, conjunctivae clear. Ears: External auditory canals clear bilaterally.  There is no edema or erythema.  Tympanic membrane is within normal limits bilaterally. Nose: Normal skin and external support.  Anterior rhinoscopy reveals healthy pink mucosa over the septum and turbinates.  No lesions or polyps were seen. Oral cavity: Lips without lesions, oral mucosa moist, no masses or lesions seen. A small hemangioma noted at the hard palate, without active bleeding. Indirect  mirror laryngoscopy could not be tolerated. Pharynx: Clear, no erythema. Neck: Supple, full range of motion, no lymphadenopathy, no masses palpable. Salivary: Parotid and submandibular glands without mass. Neuro:  CN  2-12 grossly intact. Gait normal. Vestibular: No nystagmus at any point of gaze.   Procedure:  Flexible Fiberoptic Laryngoscopy -- Risks, benefits, and alternatives of flexible endoscopy were explained to the patient.  Specific mention was made of the risk of throat numbness with difficulty swallowing, possible bleeding from the nose and mouth, and pain from the procedure.  The patient gave oral consent to proceed.  The nasal cavities were decongested and anesthetised with a combination of oxymetazoline and 4% lidocaine solution.  The flexible scope was inserted into the right nasal cavity and advanced towards the nasopharynx.  Visualized mucosa over the turbinates and septum were as described above.  The nasopharynx was clear.  Oropharyngeal walls were symmetric and mobile without lesion, mass, or edema.  Hypopharynx was also without  lesion or edema.  Larynx was mobile without lesions. Supraglottic structures were free of edema, mass, and asymmetry.  True vocal folds were white without mass or lesion.  Base of tongue was within normal limits.  The patient tolerated the procedure well.   Assessment 1. A small hemangioma is noted along the hard palate. This could be the source of the patient's intermittent bleeding.  2. No other suspicious mass or lesion is noted on today's fiberoptic laryngoscopy exam.  Plan 1. The physical exam and laryngoscopy findings are reviewed with the patient and her family.  2. Treatment options include observation versus surgical excision of the hemangioma. The risks, benefits, alternatives, and details of the procedure are reviewed with the patient. Questions are invited and answered. 3. The patient would like to proceed with the excision procedure.

## 2020-01-03 NOTE — Anesthesia Postprocedure Evaluation (Signed)
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  Last Vitals:  Vitals:   01/03/20 1145 01/03/20 1200  BP: 112/86 108/77  Pulse: 64 71  Resp: 17 18  Temp:  36.7 C  SpO2: 96% 99%    Last Pain:  Vitals:   01/03/20 1200  TempSrc:   PainSc: 0-No pain                 Pervis Hocking

## 2020-01-03 NOTE — Transfer of Care (Signed)
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  Last Pain:  Vitals:   01/03/20 0940  TempSrc: Oral  PainSc: 0-No pain      Patients Stated Pain Goal: 3 (16/10/96 0454)  Complications: No complications documented.

## 2020-01-03 NOTE — Discharge Instructions (Addendum)
The patient may resume all her previous activities and diet.  No postop restrictions.   Post Anesthesia Home Care Instructions  Activity: Get plenty of rest for the remainder of the day. A responsible individual must stay with you for 24 hours following the procedure.  For the next 24 hours, DO NOT: -Drive a car -Paediatric nurse -Drink alcoholic beverages -Take any medication unless instructed by your physician -Make any legal decisions or sign important papers.  Meals: Start with liquid foods such as gelatin or soup. Progress to regular foods as tolerated. Avoid greasy, spicy, heavy foods. If nausea and/or vomiting occur, drink only clear liquids until the nausea and/or vomiting subsides. Call your physician if vomiting continues.  Special Instructions/Symptoms: Your throat may feel dry or sore from the anesthesia or the breathing tube placed in your throat during surgery. If this causes discomfort, gargle with warm salt water. The discomfort should disappear within 24 hours.  If you had a scopolamine patch placed behind your ear for the management of post- operative nausea and/or vomiting:  1. The medication in the patch is effective for 72 hours, after which it should be removed.  Wrap patch in a tissue and discard in the trash. Wash hands thoroughly with soap and water. 2. You may remove the patch earlier than 72 hours if you experience unpleasant side effects which may include dry mouth, dizziness or visual disturbances. 3. Avoid touching the patch. Wash your hands with soap and water after contact with the patch.    May take Tylenol after 4pm, if needed.

## 2020-01-04 ENCOUNTER — Encounter (HOSPITAL_BASED_OUTPATIENT_CLINIC_OR_DEPARTMENT_OTHER): Payer: Self-pay | Admitting: Otolaryngology

## 2020-01-04 LAB — SURGICAL PATHOLOGY

## 2020-01-09 ENCOUNTER — Encounter: Payer: Self-pay | Admitting: Family Medicine

## 2020-01-09 ENCOUNTER — Ambulatory Visit: Payer: Medicaid Other | Admitting: Family Medicine

## 2020-01-09 ENCOUNTER — Other Ambulatory Visit: Payer: Self-pay

## 2020-01-09 VITALS — BP 124/68 | HR 74 | Temp 98.4°F | Resp 16 | Ht <= 58 in | Wt 160.0 lb

## 2020-01-09 DIAGNOSIS — N182 Chronic kidney disease, stage 2 (mild): Secondary | ICD-10-CM | POA: Diagnosis not present

## 2020-01-09 DIAGNOSIS — I5032 Chronic diastolic (congestive) heart failure: Secondary | ICD-10-CM | POA: Diagnosis not present

## 2020-01-09 DIAGNOSIS — E1122 Type 2 diabetes mellitus with diabetic chronic kidney disease: Secondary | ICD-10-CM

## 2020-01-09 DIAGNOSIS — Z794 Long term (current) use of insulin: Secondary | ICD-10-CM

## 2020-01-09 DIAGNOSIS — I10 Essential (primary) hypertension: Secondary | ICD-10-CM | POA: Diagnosis not present

## 2020-01-09 DIAGNOSIS — J4540 Moderate persistent asthma, uncomplicated: Secondary | ICD-10-CM | POA: Diagnosis not present

## 2020-01-09 MED ORDER — ACCU-CHEK SOFTCLIX LANCETS MISC
1 refills | Status: DC
Start: 2020-01-09 — End: 2020-06-11

## 2020-01-09 MED ORDER — AMLODIPINE BESYLATE 5 MG PO TABS
ORAL_TABLET | ORAL | 1 refills | Status: DC
Start: 2020-01-09 — End: 2020-06-11

## 2020-01-09 MED ORDER — ASPIRIN 81 MG PO TBEC: 81.0000 mg | DELAYED_RELEASE_TABLET | Freq: Every day | ORAL | 12 refills | Status: DC

## 2020-01-09 MED ORDER — NOVOLOG FLEXPEN 100 UNIT/ML ~~LOC~~ SOPN
PEN_INJECTOR | SUBCUTANEOUS | 1 refills | Status: DC
Start: 2020-01-09 — End: 2020-06-11

## 2020-01-09 MED ORDER — PRAVASTATIN SODIUM 20 MG PO TABS
20.0000 mg | ORAL_TABLET | Freq: Every day | ORAL | 3 refills | Status: DC
Start: 2020-01-09 — End: 2020-02-15

## 2020-01-09 MED ORDER — FUROSEMIDE 20 MG PO TABS
20.0000 mg | ORAL_TABLET | Freq: Every day | ORAL | 3 refills | Status: DC
Start: 2020-01-09 — End: 2020-06-11

## 2020-01-09 MED ORDER — SUCRALFATE 1 G PO TABS
1.0000 g | ORAL_TABLET | Freq: Two times a day (BID) | ORAL | 1 refills | Status: DC
Start: 2020-01-09 — End: 2020-02-20

## 2020-01-09 MED ORDER — ACCU-CHEK AVIVA PLUS VI STRP: ORAL_STRIP | 1 refills | Status: DC

## 2020-01-09 MED ORDER — BLOOD GLUCOSE SYSTEM PAK KIT: PACK | 1 refills | Status: DC

## 2020-01-09 MED ORDER — LISINOPRIL 10 MG PO TABS
10.0000 mg | ORAL_TABLET | Freq: Every day | ORAL | 3 refills | Status: DC
Start: 2020-01-09 — End: 2020-06-11

## 2020-01-09 MED ORDER — ARNUITY ELLIPTA 100 MCG/ACT IN AEPB
1.0000 | INHALATION_SPRAY | Freq: Every day | RESPIRATORY_TRACT | 3 refills | Status: DC
Start: 2020-01-09 — End: 2020-08-30

## 2020-01-09 MED ORDER — METOPROLOL TARTRATE 25 MG PO TABS: ORAL_TABLET | ORAL | 3 refills | Status: DC

## 2020-01-09 MED ORDER — LANTUS SOLOSTAR 100 UNIT/ML ~~LOC~~ SOPN
PEN_INJECTOR | SUBCUTANEOUS | 5 refills | Status: DC
Start: 2020-01-09 — End: 2020-06-11

## 2020-01-09 NOTE — Assessment & Plan Note (Signed)
Medications reviewed in detail.  Blood pressure is controlled her blood sugars are controlled.  Her renal function is at her baseline.  Regarding her chronic diastolic heart failure there is no sign of fluid overload.  Have not made any changes to her medications today.  She will continue the same insulin regimen as well 38 units at bedtime and 6 units with meals.  She does need a new glucometer so we will send this out for today.  She will follow up with me when she returns from [place] at the end of January

## 2020-01-09 NOTE — Patient Instructions (Signed)
F/U when you return from [place]

## 2020-01-09 NOTE — Assessment & Plan Note (Signed)
Continue arnuity Albuterol refilled Pt has a nebulizer in [place] as well

## 2020-01-09 NOTE — Progress Notes (Signed)
Subjective:    Patient ID: Joyce Gilmore, female    DOB: 1934-10-17, 85 y.o.   MRN: [id number]  Patient presents for Follow-up (is not fasting)  Patient here for follow-up.  Since her last visit she has had the hemangioma on the roof of her mouth removed by ENT.  She not had any significant complications.  Reviewed the labs from the hospital her sodium was stable at 133 creatinine 0.90 Her hemoglobin was 11.7  Last A1C was 7% in August, Lantus changed to 38 units as she had hypoglycemia episodes  Her CBG have averaged 115-120 over the past month  She feels well She will be leaving to go to [place] for 2 months So daughter wanted to update her meds  Flu shot done, she will get COVID booster tomorrow   Review Of Systems:  GEN- denies fatigue, fever, weight loss,weakness, recent illness HEENT- denies eye drainage, change in vision, nasal discharge, CVS- denies chest pain, palpitations RESP- denies SOB, cough, wheeze ABD- denies N/V, change in stools, abd pain GU- denies dysuria, hematuria, dribbling, incontinence MSK- + joint pain, muscle aches, injury Neuro- denies headache, dizziness, syncope, seizure activity       Objective:    BP 124/68   Pulse 74   Temp 98.4 F (36.9 C) (Temporal)   Resp 16   Ht 4\' 8"  (1.422 m)   Wt 160 lb (72.6 kg)   SpO2 96%   BMI 35.87 kg/m  GEN- NAD, alert and oriented x3 HEENT- PERRL, EOMI, non injected sclera, pink conjunctiva, MMM, oropharynx clear roof of mouth surgical site d/c/i  Neck- Supple, no thyromegaly CVS- RRR, no murmur RESP-CTAB ABD-NABS,soft,NT,ND EXT- No edema Pulses- Radial, DP- 2+        Assessment & Plan:      Problem List Items Addressed This Visit      Unprioritized   Asthmatic bronchitis    Continue arnuity Albuterol refilled Pt has a nebulizer in [place] as well       Relevant Medications   Fluticasone Furoate (ARNUITY ELLIPTA) 100 MCG/ACT AEPB   Chronic diastolic heart failure (HCC)   Relevant  Medications   aspirin (ASPIRIN 81) 81 MG EC tablet   amLODipine (NORVASC) 5 MG tablet   furosemide (LASIX) 20 MG tablet   lisinopril (ZESTRIL) 10 MG tablet   metoprolol tartrate (LOPRESSOR) 25 MG tablet   pravastatin (PRAVACHOL) 20 MG tablet   Essential hypertension - Primary    Medications reviewed in detail.  Blood pressure is controlled her blood sugars are controlled.  Her renal function is at her baseline.  Regarding her chronic diastolic heart failure there is no sign of fluid overload.  Have not made any changes to her medications today.  She will continue the same insulin regimen as well 38 units at bedtime and 6 units with meals.  She does need a new glucometer so we will send this out for today.  She will follow up with me when she returns from [place] at the end of January      Relevant Medications   aspirin (ASPIRIN 81) 81 MG EC tablet   amLODipine (NORVASC) 5 MG tablet   furosemide (LASIX) 20 MG tablet   lisinopril (ZESTRIL) 10 MG tablet   metoprolol tartrate (LOPRESSOR) 25 MG tablet   pravastatin (PRAVACHOL) 20 MG tablet   Type 2 diabetes mellitus with diabetic chronic kidney disease (HCC)   Relevant Medications   aspirin (ASPIRIN 81) 81 MG EC tablet   insulin glargine (  LANTUS SOLOSTAR) 100 UNIT/ML Solostar Pen   lisinopril (ZESTRIL) 10 MG tablet   insulin aspart (NOVOLOG FLEXPEN) 100 UNIT/ML FlexPen   pravastatin (PRAVACHOL) 20 MG tablet      Note: This dictation was prepared with Dragon dictation along with smaller phrase technology. Any transcriptional errors that result from this process are unintentional.

## 2020-01-10 ENCOUNTER — Ambulatory Visit: Payer: Medicaid Other | Attending: Internal Medicine

## 2020-01-10 DIAGNOSIS — Z23 Encounter for immunization: Secondary | ICD-10-CM

## 2020-01-10 NOTE — Progress Notes (Signed)
   Covid-19 Vaccination Clinic  Name:  Joyce Gilmore    MRN: [id number] DOB: 05-16-1934  01/10/2020  Ms. Joyce Gilmore was observed post Covid-19 immunization for 15 minutes without incident. She was provided with Vaccine Information Sheet and instruction to access the V-Safe system.   Ms. Joyce Gilmore was instructed to call 911 with any severe reactions post vaccine: Marland Kitchen Difficulty breathing  . Swelling of face and throat  . A fast heartbeat  . A bad rash all over body  . Dizziness and weakness   Immunizations Administered    Name Date Dose VIS Date Route   Pfizer COVID-19 Vaccine 01/10/2020  2:25 PM 0.3 mL 12/14/2019 Intramuscular   Manufacturer: [place]   Lot: [id number]   [place]: [id number]

## 2020-01-12 ENCOUNTER — Other Ambulatory Visit: Payer: Self-pay | Admitting: Physician Assistant

## 2020-02-15 ENCOUNTER — Other Ambulatory Visit: Payer: Self-pay | Admitting: Family Medicine

## 2020-02-16 ENCOUNTER — Other Ambulatory Visit: Payer: Self-pay | Admitting: Family Medicine

## 2020-03-26 ENCOUNTER — Telehealth: Payer: Self-pay

## 2020-03-26 ENCOUNTER — Other Ambulatory Visit: Payer: Self-pay | Admitting: Orthopaedic Surgery

## 2020-03-26 MED ORDER — TRAMADOL HCL 50 MG PO TABS
50.0000 mg | ORAL_TABLET | Freq: Four times a day (QID) | ORAL | 0 refills | Status: DC | PRN
Start: 2020-03-26 — End: 2020-04-19

## 2020-03-26 NOTE — Telephone Encounter (Signed)
Patient would like a refill on Tramadol sent to [facility] on [address].  Cb# [phone].  Please advise.  Thank you.

## 2020-04-02 ENCOUNTER — Encounter: Payer: Self-pay | Admitting: Family Medicine

## 2020-04-02 ENCOUNTER — Ambulatory Visit: Payer: Medicaid Other | Admitting: Family Medicine

## 2020-04-02 ENCOUNTER — Other Ambulatory Visit: Payer: Self-pay

## 2020-04-02 VITALS — BP 130/88 | HR 98 | Temp 98.3°F | Resp 14 | Ht <= 58 in | Wt 158.0 lb

## 2020-04-02 DIAGNOSIS — E1122 Type 2 diabetes mellitus with diabetic chronic kidney disease: Secondary | ICD-10-CM | POA: Diagnosis not present

## 2020-04-02 DIAGNOSIS — I10 Essential (primary) hypertension: Secondary | ICD-10-CM

## 2020-04-02 DIAGNOSIS — I5032 Chronic diastolic (congestive) heart failure: Secondary | ICD-10-CM

## 2020-04-02 DIAGNOSIS — N182 Chronic kidney disease, stage 2 (mild): Secondary | ICD-10-CM

## 2020-04-02 DIAGNOSIS — J4540 Moderate persistent asthma, uncomplicated: Secondary | ICD-10-CM

## 2020-04-02 DIAGNOSIS — K7469 Other cirrhosis of liver: Secondary | ICD-10-CM | POA: Diagnosis not present

## 2020-04-02 DIAGNOSIS — Z794 Long term (current) use of insulin: Secondary | ICD-10-CM | POA: Diagnosis not present

## 2020-04-02 DIAGNOSIS — M10071 Idiopathic gout, right ankle and foot: Secondary | ICD-10-CM

## 2020-04-02 MED ORDER — COLCHICINE 0.6 MG PO CAPS
1.0000 | ORAL_CAPSULE | Freq: Every day | ORAL | 0 refills | Status: DC
Start: 2020-04-02 — End: 2020-04-19

## 2020-04-02 NOTE — Assessment & Plan Note (Signed)
Recheck labs today No hypoglycemia Continue insulin therapy 38 units bedtime, Novolog 6 units with meals

## 2020-04-02 NOTE — Assessment & Plan Note (Signed)
No recent flares Continue arnuity for prevention Reviewed in detail with pt and interpreter

## 2020-04-02 NOTE — Assessment & Plan Note (Signed)
Controlled no changes 

## 2020-04-02 NOTE — Progress Notes (Signed)
Subjective:    Patient ID: Joyce Gilmore, female    DOB: 10-15-1934, 86 y.o.   MRN: [id number]  Patient presents for Follow-up (Is not fasting) and R Foot Edema/Pain (X1 week- no known injury) Patient here to follow-up chronic medical problems.  She is due for A1c and renal function today.  She is here with an interpreter and her daughter.  Diabetes mellitus last A1c 7% in August 2021, currently on Lantus 38 units and NovoLog  6 units with each meal.  CBG this AM  120  Average  132 for the past 30 days on meter  no hypoglycemia symptoms   Chronic diastolic heart failure no fluid overload she is on Lasix 20 mg once a day.  Hypertension she is taking her blood pressure medicines as prescribed  Asthma bronchitis has albuterol as needed Arnuity for her prevention.  She has not needed nebulizer    Also complains of right foot pain for the past week.  States it feels like when it swelled up a few months ago. Started when she came back from [place] . At that time she was diagnosed with gout and treated with colchicine.  She has a MD colchicine follow with her today from November.  She has also been taking some tramadol but that has not helped as much.  States that initially started on the sole of her foot but it settled in her ankle.  No  Injury.   Review Of Systems:  GEN- denies fatigue, fever, weight loss,weakness, recent illness HEENT- denies eye drainage, change in vision, nasal discharge, CVS- denies chest pain, palpitations RESP- denies SOB, cough, wheeze ABD- denies N/V, change in stools, abd pain GU- denies dysuria, hematuria, dribbling, incontinence MSK- + joint pain, muscle aches, injury Neuro- denies headache, dizziness, syncope, seizure activity       Objective:    BP 130/88   Pulse 98   Temp 98.3 F (36.8 C) (Temporal)   Resp 14   Ht 4\' 8"  (1.422 m)   Wt 158 lb (71.7 kg)   SpO2 96%   BMI 35.42 kg/m  GEN- NAD, alert and oriented x3 HEENT- PERRL, EOMI, non  injected sclera, pink conjunctiva, MMM, oropharynx clear Neck- Supple, no thyromegaly CVS- RRR, no murmur RESP-CTAB ABD-NABS,soft,NT,ND EXT- Right foot TTP bilat ankle, mild erythema and swelling ankle to mid foot, toe NT, no lesions on soles, no skin openings  Pulses- Radial, DP- 2+        Assessment & Plan:      Problem List Items Addressed This Visit      Unprioritized   Asthmatic bronchitis    No recent flares Continue arnuity for prevention Reviewed in detail with pt and interpreter       Chronic diastolic heart failure ([place])   Essential hypertension - Primary    Controlled no changes       Relevant Orders   CBC with Differential/Platelet   Comprehensive metabolic panel   Lipid panel   Hepatic cirrhosis (HCC)    Hepatic cirrhosis noted on imaging but he has not had any decompensation.      Type 2 diabetes mellitus with diabetic chronic kidney disease ([place])    Recheck labs today No hypoglycemia Continue insulin therapy 38 units bedtime, Novolog 6 units with meals      Relevant Orders   Hemoglobin A1c   Lipid panel    Other Visit Diagnoses    Acute idiopathic gout of right foot  Treat with colchine daily x 10 days   Relevant Medications   Colchicine (MITIGARE) 0.6 MG CAPS   Other Relevant Orders   Uric Acid      Note: This dictation was prepared with Dragon dictation along with smaller phrase technology. Any transcriptional errors that result from this process are unintentional.

## 2020-04-02 NOTE — Patient Instructions (Addendum)
F/U 4 months with Joyce Gilmore Take the gout pill once a day

## 2020-04-02 NOTE — Assessment & Plan Note (Signed)
Hepatic cirrhosis noted on imaging but he has not had any decompensation.

## 2020-04-03 LAB — URIC ACID: Uric Acid, Serum: 6.7 mg/dL (ref 2.5–7.0)

## 2020-04-03 LAB — CBC WITH DIFFERENTIAL/PLATELET
Absolute Monocytes: 466 cells/uL (ref 200–950)
Basophils Absolute: 57 cells/uL (ref 0–200)
Basophils Relative: 0.9 %
Eosinophils Absolute: 132 cells/uL (ref 15–500)
Eosinophils Relative: 2.1 %
HCT: 35.8 % (ref 35.0–45.0)
Hemoglobin: 11.2 g/dL — ABNORMAL LOW (ref 11.7–15.5)
Lymphs Abs: 2287 cells/uL (ref 850–3900)
MCH: 23.7 pg — ABNORMAL LOW (ref 27.0–33.0)
MCHC: 31.3 g/dL — ABNORMAL LOW (ref 32.0–36.0)
MCV: 75.7 fL — ABNORMAL LOW (ref 80.0–100.0)
MPV: 10.4 fL (ref 7.5–12.5)
Monocytes Relative: 7.4 %
Neutro Abs: 3358 cells/uL (ref 1500–7800)
Neutrophils Relative %: 53.3 %
Platelets: 318 10*3/uL (ref 140–400)
RBC: 4.73 10*6/uL (ref 3.80–5.10)
RDW: 14.9 % (ref 11.0–15.0)
Total Lymphocyte: 36.3 %
WBC: 6.3 10*3/uL (ref 3.8–10.8)

## 2020-04-03 LAB — COMPREHENSIVE METABOLIC PANEL
AG Ratio: 1.4 (calc) (ref 1.0–2.5)
ALT: 14 U/L (ref 6–29)
AST: 19 U/L (ref 10–35)
Albumin: 4.2 g/dL (ref 3.6–5.1)
Alkaline phosphatase (APISO): 76 U/L (ref 37–153)
BUN/Creatinine Ratio: 22 (calc) (ref 6–22)
BUN: 22 mg/dL (ref 7–25)
CO2: 25 mmol/L (ref 20–32)
Calcium: 9.8 mg/dL (ref 8.6–10.4)
Chloride: 101 mmol/L (ref 98–110)
Creat: 0.99 mg/dL — ABNORMAL HIGH (ref 0.60–0.88)
Globulin: 3 g/dL (calc) (ref 1.9–3.7)
Glucose, Bld: 98 mg/dL (ref 65–99)
Potassium: 4.7 mmol/L (ref 3.5–5.3)
Sodium: 135 mmol/L (ref 135–146)
Total Bilirubin: 0.3 mg/dL (ref 0.2–1.2)
Total Protein: 7.2 g/dL (ref 6.1–8.1)

## 2020-04-03 LAB — HEMOGLOBIN A1C
Hgb A1c MFr Bld: 8 % of total Hgb — ABNORMAL HIGH (ref <5.7)
Mean Plasma Glucose: 183 mg/dL
eAG (mmol/L): 10.1 mmol/L

## 2020-04-03 LAB — LIPID PANEL
Cholesterol: 137 mg/dL (ref <200)
HDL: 52 mg/dL (ref >=50)
LDL Cholesterol (Calc): 62 mg/dL (calc)
Non-HDL Cholesterol (Calc): 85 mg/dL (calc) (ref <130)
Total CHOL/HDL Ratio: 2.6 (calc) (ref <5.0)
Triglycerides: 146 mg/dL (ref <150)

## 2020-04-06 ENCOUNTER — Other Ambulatory Visit: Payer: Self-pay | Admitting: *Deleted

## 2020-04-06 NOTE — Patient Outreach (Signed)
Care Coordination  04/06/2020  Joyce Gilmore 1935-01-01 [id number]    Medicaid Managed Care   Unsuccessful Outreach Note  04/06/2020 Name: Joyce Gilmore MRN: [id number] DOB: 1934/06/03  Referred by: Alycia Rossetti, MD Reason for referral : High Risk Managed Medicaid (Unsuccessful RNCM initial outreach)   An unsuccessful telephone outreach was attempted today. The patient was referred to the case management team for assistance with care management and care coordination.   Follow Up Plan: The care management team will reach out to the patient again over the next 7-14 days.   Lurena Joiner RN, BSN [place]  [employer] manager

## 2020-04-06 NOTE — Patient Instructions (Signed)
Visit Information  Ms. Joyce Gilmore  - as a part of your Medicaid benefit, you are eligible for care management and care coordination services at no cost or copay. I was unable to reach you by phone today but would be happy to help you with your health related needs. Please feel free to call me @ [phone].   A member of the Managed Medicaid care management team will reach out to you again over the next 7-14 days.   Lurena Joiner RN, BSN [place]  [employer] manager

## 2020-04-19 ENCOUNTER — Encounter: Payer: Self-pay | Admitting: Orthopaedic Surgery

## 2020-04-19 ENCOUNTER — Ambulatory Visit (INDEPENDENT_AMBULATORY_CARE_PROVIDER_SITE_OTHER): Payer: Medicaid Other | Admitting: Orthopaedic Surgery

## 2020-04-19 ENCOUNTER — Ambulatory Visit (INDEPENDENT_AMBULATORY_CARE_PROVIDER_SITE_OTHER): Payer: Medicaid Other

## 2020-04-19 DIAGNOSIS — M25571 Pain in right ankle and joints of right foot: Secondary | ICD-10-CM | POA: Diagnosis not present

## 2020-04-19 MED ORDER — COLCHICINE 0.6 MG PO CAPS
1.0000 | ORAL_CAPSULE | Freq: Every day | ORAL | 0 refills | Status: DC
Start: 2020-04-19 — End: 2020-07-11

## 2020-04-19 MED ORDER — MELOXICAM 15 MG PO TABS: 15.0000 mg | ORAL_TABLET | Freq: Every day | ORAL | 1 refills | Status: DC | PRN

## 2020-04-19 MED ORDER — TRAMADOL HCL 50 MG PO TABS
50.0000 mg | ORAL_TABLET | Freq: Four times a day (QID) | ORAL | 0 refills | Status: DC | PRN
Start: 2020-04-19 — End: 2020-06-11

## 2020-04-19 NOTE — Progress Notes (Signed)
The patient comes in today with acute swelling of her right ankle.  She is 86 years old and does have a history of gout.  We actually saw her in October of last year and she had swelling of the left ankle.  X-rays are negative for fracture and she was treated for gout then.  Recently she has had a flareup again.  The right ankle swelling for her.  Her primary care physician had recently put her on some colchicine for a few days.  She is a diabetic and her last hemoglobin A1c just this month was 8.0 so she is not a good candidate for steroids.  She denies any specific injury.  Examination of her right ankle does show swelling of the soft tissues but no redness.  It is global around the ankle.  Her ankle is ligamentously stable and she is able to move it back and forth with flexion and extension with some pain.  Her Achilles is intact.  Her foot feels well-perfused.  3 views of the right ankle shows soft tissue swelling around the ankle.  There is a old healed fibular fracture but no malalignment of the ankle itself.  There is slight flattening of the foot.  This still seems to be an inflammatory process such as gout.  Her left ankle is back to being normal after swelling like this on her right side.  I would put her back on colchicine for a few days as well as try meloxicam once a day and some tramadol as needed for pain.  Follow-up should be had someone again with her primary care physician for long-term treatment of gout.

## 2020-04-24 ENCOUNTER — Other Ambulatory Visit: Payer: Self-pay

## 2020-04-30 ENCOUNTER — Other Ambulatory Visit: Payer: Self-pay | Admitting: Family Medicine

## 2020-05-08 ENCOUNTER — Encounter: Payer: Self-pay | Admitting: Family Medicine

## 2020-06-08 ENCOUNTER — Encounter: Payer: Self-pay | Admitting: Nurse Practitioner

## 2020-06-11 ENCOUNTER — Other Ambulatory Visit: Payer: Self-pay

## 2020-06-11 DIAGNOSIS — I10 Essential (primary) hypertension: Secondary | ICD-10-CM

## 2020-06-11 DIAGNOSIS — M10071 Idiopathic gout, right ankle and foot: Secondary | ICD-10-CM

## 2020-06-11 DIAGNOSIS — E1122 Type 2 diabetes mellitus with diabetic chronic kidney disease: Secondary | ICD-10-CM

## 2020-06-11 DIAGNOSIS — J4540 Moderate persistent asthma, uncomplicated: Secondary | ICD-10-CM

## 2020-06-11 MED ORDER — LISINOPRIL 10 MG PO TABS: 10.0000 mg | ORAL_TABLET | Freq: Every day | ORAL | 3 refills | Status: DC

## 2020-06-11 MED ORDER — LANTUS SOLOSTAR 100 UNIT/ML ~~LOC~~ SOPN: PEN_INJECTOR | SUBCUTANEOUS | 5 refills | Status: DC

## 2020-06-11 MED ORDER — ACCU-CHEK SOFTCLIX LANCETS MISC: 1 refills | Status: DC

## 2020-06-11 MED ORDER — AMLODIPINE BESYLATE 5 MG PO TABS: ORAL_TABLET | ORAL | 1 refills | Status: DC

## 2020-06-11 MED ORDER — FUROSEMIDE 20 MG PO TABS: 20.0000 mg | ORAL_TABLET | Freq: Every day | ORAL | 3 refills | Status: DC

## 2020-06-11 MED ORDER — ALBUTEROL SULFATE (2.5 MG/3ML) 0.083% IN NEBU: 2.5000 mg | INHALATION_SOLUTION | Freq: Four times a day (QID) | RESPIRATORY_TRACT | 1 refills | Status: DC | PRN

## 2020-06-11 MED ORDER — TRAMADOL HCL 50 MG PO TABS: 50.0000 mg | ORAL_TABLET | Freq: Four times a day (QID) | ORAL | 0 refills | Status: DC | PRN

## 2020-06-11 MED ORDER — NOVOLOG FLEXPEN 100 UNIT/ML ~~LOC~~ SOPN: PEN_INJECTOR | SUBCUTANEOUS | 1 refills | Status: DC

## 2020-06-11 MED ORDER — METOPROLOL TARTRATE 25 MG PO TABS: ORAL_TABLET | ORAL | 3 refills | Status: DC

## 2020-06-11 MED ORDER — SUCRALFATE 1 G PO TABS: ORAL_TABLET | ORAL | 1 refills | Status: DC

## 2020-06-11 MED ORDER — MELOXICAM 15 MG PO TABS: 15.0000 mg | ORAL_TABLET | Freq: Every day | ORAL | 1 refills | Status: DC | PRN

## 2020-06-11 NOTE — Progress Notes (Signed)
It looks like Dr. Ninfa Linden was providing Tramadol for patient - please deny Tramadol.

## 2020-06-21 ENCOUNTER — Other Ambulatory Visit: Payer: Self-pay | Admitting: Family Medicine

## 2020-06-29 ENCOUNTER — Other Ambulatory Visit: Payer: Self-pay | Admitting: Family Medicine

## 2020-06-29 DIAGNOSIS — I10 Essential (primary) hypertension: Secondary | ICD-10-CM

## 2020-07-11 ENCOUNTER — Ambulatory Visit: Payer: Medicaid Other | Admitting: Nurse Practitioner

## 2020-07-11 ENCOUNTER — Encounter: Payer: Self-pay | Admitting: Nurse Practitioner

## 2020-07-11 ENCOUNTER — Other Ambulatory Visit: Payer: Self-pay

## 2020-07-11 VITALS — BP 132/74 | HR 50 | Temp 98.2°F | Resp 16 | Ht <= 58 in | Wt 154.0 lb

## 2020-07-11 DIAGNOSIS — E7849 Other hyperlipidemia: Secondary | ICD-10-CM

## 2020-07-11 DIAGNOSIS — I5032 Chronic diastolic (congestive) heart failure: Secondary | ICD-10-CM

## 2020-07-11 DIAGNOSIS — N1831 Chronic kidney disease, stage 3a: Secondary | ICD-10-CM

## 2020-07-11 DIAGNOSIS — I10 Essential (primary) hypertension: Secondary | ICD-10-CM

## 2020-07-11 DIAGNOSIS — M1A0790 Idiopathic chronic gout, unspecified ankle and foot, without tophus (tophi): Secondary | ICD-10-CM | POA: Diagnosis not present

## 2020-07-11 DIAGNOSIS — D649 Anemia, unspecified: Secondary | ICD-10-CM

## 2020-07-11 DIAGNOSIS — M5136 Other intervertebral disc degeneration, lumbar region: Secondary | ICD-10-CM | POA: Diagnosis not present

## 2020-07-11 DIAGNOSIS — E1122 Type 2 diabetes mellitus with diabetic chronic kidney disease: Secondary | ICD-10-CM

## 2020-07-11 DIAGNOSIS — N182 Chronic kidney disease, stage 2 (mild): Secondary | ICD-10-CM

## 2020-07-11 DIAGNOSIS — Z794 Long term (current) use of insulin: Secondary | ICD-10-CM | POA: Diagnosis not present

## 2020-07-11 MED ORDER — PRAVASTATIN SODIUM 20 MG PO TABS: ORAL_TABLET | ORAL | 1 refills | Status: DC

## 2020-07-11 NOTE — Progress Notes (Signed)
Subjective:    Patient ID: Joyce Gilmore, female    DOB: Dec 09, 1934, 86 y.o.   MRN: [id number]  HPI: Joyce Gilmore is a 86 y.o. female presenting with interpreter Artemio Aly and daughter for follow up.  Chief Complaint  Patient presents with  . Follow-up    Is not fasting    Patient reports she just got back from [place] - got sick there and was given some medicine.  She does not know what she was diagnosed with over-the-counter medication she was given.  She does not have any records today.  She does state that she feels completely better from that sickness.    HYPERTENSION / HYPERLIPIDEMIA Patient is currently taking lisinopril 10 mg, amlodipine 5 mg for blood pressure.  She is taking pravastatin 20 mg daily for cholesterol.  She is tolerating these medications well. Satisfied with current treatment? yes Duration of hypertension: chronic BP monitoring frequency: not checking BP medication side effects: no Duration of hyperlipidemia: chronic Cholesterol medication side effects: no Aspirin: yes Recent stressors: yes; reports while she was visiting [place], her grandson died. Recurrent headaches: no Visual changes: no Palpitations: no Dyspnea: no Chest pain: no Lower extremity edema: no Dizzy/lightheaded: no  DIABETES Patient is currently taking NovoLog sliding scale during the day and Lantus at bedtime for diabetes.  She is checking her blood sugars, unclear how many times per day she is doing this.  She is requesting a new sugar monitor with kit today. Hypoglycemic episodes:no Polydipsia/polyuria: no Visual disturbance: no Chest pain: no Paresthesias: no Glucose Monitoring: yes; does not think her monitor is working correctly  Accucheck frequency: Unclear  Fasting glucose: 173  Taking Insulin?: yes  Long acting insulin: Lantus 39 units at bedtime  Short acting insulin: NovoLog 6 units with meals Blood Pressure Monitoring: not checking Retinal Examination: Not up to  Date; ordered at previous visit Foot Exam: Up to Date Diabetic Education: Completed Pneumovax: Up to Date Influenza: Up to Date Aspirin: yes  GOUT Patient denies any recent issues with gout.  She has run out of the colchicine and has not been taking anything for gout.  CHRONIC PAIN  Patient has chronic pain in her back.  She takes Tylenol or tramadol for her pain, only uses tramadol sparingly, maybe a few times per month. Pain control status: stable Duration: chronic Location: Back Quality: sharp Current Pain Level: moderate - severe Previous Pain Level: moderate - severe Breakthrough pain: yes Benefit from narcotic medications: yes What Activities task can be accomplished with current medication?:  Caring for self, walking, cooking, cleaning, bathing, dressing Interested in weaning off narcotics:Does not take daily   Stool softners/OTC fiber: no  Previous pain specialty evaluation: no Non-narcotic analgesic meds: yes  CHRONIC DIASTOLIC HEART FAILURE Patient is maintained on metoprolol 12.5 mg twice daily and furosemide 20 mg daily.  She is not having any concerns with these medications.  She denies chest pain, shortness of breath, swelling in her legs, or difficulty laying flat to sleep.  Allergies  Allergen Reactions  . Bactrim [Sulfamethoxazole-Trimethoprim] Other (See Comments)    Hyperkalemia (elevated potassium)  . Rifampin Other (See Comments)    Severe thrombocytopenia (low blood platelet count)  . Vancomycin Other (See Comments)    Severe thrombocytopenia (low blood platelet count)    Outpatient Encounter Medications as of 07/11/2020  Medication Sig  . Acetaminophen (TYLENOL PO) Take 1,000 tablets by mouth every 6 (six) hours as needed (pain/headache).   Marland Kitchen albuterol (PROVENTIL) (2.5  MG/3ML) 0.083% nebulizer solution Take 3 mLs (2.5 mg total) by nebulization every 6 (six) hours as needed for wheezing or shortness of breath.  Marland Kitchen albuterol (VENTOLIN HFA) 108 (90 Base)  MCG/ACT inhaler Inhale 1-2 puffs into the lungs every 4 (four) hours as needed for wheezing or shortness of breath.  Marland Kitchen amLODipine (NORVASC) 5 MG tablet TAKE 1 TABLET(5 MG) BY MOUTH DAILY  . aspirin (ASPIRIN 81) 81 MG EC tablet Take 1 tablet (81 mg total) by mouth daily. Swallow whole.  . Blood Glucose Monitoring Suppl (BLOOD GLUCOSE SYSTEM PAK) KIT Please dispense based on patient and insurance preference.  Use as directed to monitor FSBS four times daily.  Dx E11.22  . Calcium Carbonate-Vit D-Min (CALCIUM 1200 PO) Take by mouth.  . Fluticasone Furoate (ARNUITY ELLIPTA) 100 MCG/ACT AEPB Inhale 1 puff into the lungs daily.  . furosemide (LASIX) 20 MG tablet Take 1 tablet (20 mg total) by mouth daily.  Marland Kitchen glucose blood test strip Please dispense based on patient and insurance preference.  Use as directed to monitor FSBS four times daily.  Dx E11.22  . insulin aspart (NOVOLOG FLEXPEN) 100 UNIT/ML FlexPen ADMINISTER 5 TO 15 UNITS UNDER THE SKIN THREE TIMES DAILY WITH MEALS PER SLIDING SCALE  . insulin glargine (LANTUS SOLOSTAR) 100 UNIT/ML Solostar Pen INJECT  38 UNITS UNDER THE SKIN AT BEDTIME  . Lancets MISC Please dispense based on patient and insurance preference.  Use as directed to monitor FSBS four times daily.  Dx E11.22  . lansoprazole (PREVACID) 30 MG capsule TAKE ONE CAPSULE BY MOUTH DAILY AT 12 NOON  . lisinopril (ZESTRIL) 10 MG tablet Take 1 tablet (10 mg total) by mouth daily.  Marland Kitchen loratadine (CLARITIN) 10 MG tablet TAKE 1 TABLET(10 MG) BY MOUTH DAILY AS NEEDED FOR ALLERGIES  . metoprolol tartrate (LOPRESSOR) 25 MG tablet TAKE 1/2 TABLET(12.5 MG) BY MOUTH TWICE DAILY  . polyvinyl alcohol (ARTIFICIAL TEARS) 1.4 % ophthalmic solution Place 1 drop into both eyes as needed for dry eyes.  Marland Kitchen sucralfate (CARAFATE) 1 g tablet TAKE 1 TABLET(1 GRAM) BY MOUTH FOUR TIMES DAILY  . traMADol (ULTRAM) 50 MG tablet Take 1-2 tablets (50-100 mg total) by mouth every 6 (six) hours as needed.  . [DISCONTINUED]  Accu-Chek Softclix Lancets lancets USE TO CHECK BLOOD SUGAR FOUR TIMES DAILY AS DIRECTED DX: E11.65  . [DISCONTINUED] B-D ULTRAFINE III SHORT PEN 31G X 8 MM MISC USE AS DIRECTED  . [DISCONTINUED] blood glucose meter kit and supplies KIT Dispense based on patient and insurance preference. Use up to four times daily as directed. (FOR ICD-9 250.00, 250.01).  . [DISCONTINUED] Blood Glucose Monitoring Suppl (BLOOD GLUCOSE SYSTEM PAK) KIT USE TO CHECK BLOOD SUGAR FOUR TIMES DAILY AS DIRECTED DX: E11.65  . [DISCONTINUED] Colchicine (MITIGARE) 0.6 MG CAPS Take 1 capsule by mouth daily. For gout  . [DISCONTINUED] glucose blood (ACCU-CHEK AVIVA PLUS) test strip USE TO CHECK BLOOD SUGAR FOUR TIMES DAILY AS DIRECTED DX: E11.65  . [DISCONTINUED] meloxicam (MOBIC) 15 MG tablet Take 1 tablet (15 mg total) by mouth daily as needed for pain.  . [DISCONTINUED] pravastatin (PRAVACHOL) 20 MG tablet TAKE 1 TABLET(20 MG) BY MOUTH DAILY  . pravastatin (PRAVACHOL) 20 MG tablet TAKE 1 TABLET(20 MG) BY MOUTH DAILY   No facility-administered encounter medications on file as of 07/11/2020.    Patient Active Problem List   Diagnosis Date Noted  . Chronic kidney disease 05/07/2018  . GERD (gastroesophageal reflux disease) 03/10/2017  . Uses hearing aid  09/12/2016  . Asthmatic bronchitis 03/11/2016  . Chronic diastolic heart failure ([place]) 10/12/2015  . Seasonal allergies 11/21/2014  . DDD (degenerative disc disease), lumbar 10/11/2014  . H/O compression fracture of spine 10/11/2014  . Pulmonary hypertension ([place])   . Hepatic cirrhosis ([place]) 09/24/2014  . SIADH (syndrome of inappropriate ADH production) ([place]) 06/13/2014  . Insomnia 12/28/2013  . Anemia 07/06/2013  . HLD (hyperlipidemia) 06/27/2013  . Osteoporosis 07/26/2012  . Hyponatremia 05/16/2011  . Type 2 diabetes mellitus with diabetic chronic kidney disease ([place]) 08/17/2006  . Essential hypertension 08/17/2006  . DIVERTICULOSIS, COLON 08/17/2006    Past  Medical History:  Diagnosis Date  . Arthritis   . CHF (congestive heart failure) (HCC)    diastolic  . Chronic kidney disease    hx of kidney stones,   . Depression    hx of   . Diabetes mellitus without complication ([place])   . Ganglion, left ankle and foot   . Hearing loss of both ears   . Hypertension   . MRSA infection    Oct 13 - Nov 13  . Osteoporosis   . Tinnitus of both ears     Relevant past medical, surgical, family and social history reviewed and updated as indicated. Interim medical history since our last visit reviewed.  Review of Systems Per HPI unless specifically indicated above     Objective:    BP 132/74   Pulse (!) 50   Temp 98.2 F (36.8 C) (Temporal)   Resp 16   Ht $R'4\' 8"'Rg$  (1.422 m)   Wt 154 lb (69.9 kg)   SpO2 96%   BMI 34.53 kg/m   Wt Readings from Last 3 Encounters:  07/11/20 154 lb (69.9 kg)  04/02/20 158 lb (71.7 kg)  01/09/20 160 lb (72.6 kg)    Physical Exam Vitals and nursing note reviewed.  Constitutional:      General: She is not in acute distress.    Appearance: Normal appearance. She is not toxic-appearing.  HENT:     Head: Normocephalic and atraumatic.     Right Ear: Tympanic membrane, ear canal and external ear normal.     Left Ear: Tympanic membrane, ear canal and external ear normal.     Nose: Nose normal. No congestion.     Mouth/Throat:     Mouth: Mucous membranes are moist.     Pharynx: Oropharynx is clear. No oropharyngeal exudate or posterior oropharyngeal erythema.  Eyes:     General: No scleral icterus.    Extraocular Movements: Extraocular movements intact.  Cardiovascular:     Rate and Rhythm: Normal rate and regular rhythm.     Heart sounds: Normal heart sounds. No murmur heard.   Pulmonary:     Effort: Pulmonary effort is normal. No respiratory distress.     Breath sounds: Normal breath sounds. No wheezing, rhonchi or rales.  Abdominal:     General: Abdomen is flat. Bowel sounds are normal.     Palpations:  Abdomen is soft.  Musculoskeletal:        General: Normal range of motion.     Cervical back: Normal range of motion.     Right lower leg: No edema.     Left lower leg: No edema.  Lymphadenopathy:     Cervical: No cervical adenopathy.  Skin:    General: Skin is warm and dry.     Capillary Refill: Capillary refill takes less than 2 seconds.     Coloration: Skin is not jaundiced  or pale.     Findings: No erythema.  Neurological:     Mental Status: She is alert and oriented to person, place, and time.     Motor: No weakness.     Gait: Gait normal.  Psychiatric:        Mood and Affect: Mood normal.        Behavior: Behavior normal.        Thought Content: Thought content normal.        Judgment: Judgment normal.       Assessment & Plan:   Problem List Items Addressed This Visit      Cardiovascular and Mediastinum   Essential hypertension    Chronic.  Blood pressure at goal today in clinic.  Continue amlodipine 5 mg, lisinopril 10 mg.  Kidney function with electrolytes checked today.  Encouraged DASH diet.  Follow-up in 3 months.      Relevant Medications   pravastatin (PRAVACHOL) 20 MG tablet   Other Relevant Orders   CBC with Differential (Completed)   COMPLETE METABOLIC PANEL WITH GFR (Completed)   Chronic diastolic heart failure (HCC)    Stable.  Appears euvolemic on exam today-no swelling or shortness of breath.  Weight is down 4 pounds, but overall stable.  Continue metoprolol 12.5 mg twice daily and furosemide 20 mg daily.  Follow-up in 3 months.      Relevant Medications   pravastatin (PRAVACHOL) 20 MG tablet     Endocrine   Type 2 diabetes mellitus with diabetic chronic kidney disease ([place]) - Primary    Chronic.  We will recheck A1c today.  Denies hypoglycemic episodes.  We will send in new prescription for blood sugar monitor with strips and lancets.  Continue current insulin regimen including NovoLog 6 units with meals during the day and Lantus 39 units at  bedtime.  Encouraged scheduling an appointment with an ophthalmologist for an eye exam.  Follow-up in 3 months.      Relevant Medications   pravastatin (PRAVACHOL) 20 MG tablet   Blood Glucose Monitoring Suppl (BLOOD GLUCOSE SYSTEM PAK) KIT   Lancets MISC   glucose blood test strip   Other Relevant Orders   Hemoglobin A1c (Completed)   COMPLETE METABOLIC PANEL WITH GFR (Completed)     Musculoskeletal and Integument   DDD (degenerative disc disease), lumbar    Chronic.  Alternates Tylenol and tramadol- it looks like about 40 tablets is lasting her a few months.  She follows with orthopedic-Dr. Ninfa Linden.  Continue collaboration with orthopedic.        Genitourinary   Chronic kidney disease    Kidney function has been borderline for some time.  We will check CMP with GFR today for close monitoring.  We will need to maximize diabetes and blood pressure control to prevent further kidney damage.  Encouraged drinking plenty of water and avoiding NSAIDs.        Other   HLD (hyperlipidemia)    Chronic.  Will hold off on taking lipids today as patient is not fasting.  Continue pravastatin 20 mg daily for now.  Follow-up in 3 months.      Relevant Medications   pravastatin (PRAVACHOL) 20 MG tablet   Anemia   Relevant Orders   CBC with Differential (Completed)    Other Visit Diagnoses    Chronic gout of ankle, unspecified cause, unspecified laterality       Relevant Orders   Uric Acid (Completed)       Follow up plan:  Return in about 3 months (around 10/11/2020) for Chronic follow-up.

## 2020-07-12 ENCOUNTER — Encounter: Payer: Self-pay | Admitting: Nurse Practitioner

## 2020-07-12 DIAGNOSIS — I129 Hypertensive chronic kidney disease with stage 1 through stage 4 chronic kidney disease, or unspecified chronic kidney disease: Secondary | ICD-10-CM | POA: Insufficient documentation

## 2020-07-12 DIAGNOSIS — E1122 Type 2 diabetes mellitus with diabetic chronic kidney disease: Secondary | ICD-10-CM | POA: Insufficient documentation

## 2020-07-12 MED ORDER — GLUCOSE BLOOD VI STRP: ORAL_STRIP | 11 refills | Status: DC

## 2020-07-12 MED ORDER — BLOOD GLUCOSE SYSTEM PAK KIT
PACK | 1 refills | Status: DC
Start: 2020-07-12 — End: 2021-04-23

## 2020-07-12 MED ORDER — LANCETS MISC: 99 refills | Status: DC

## 2020-07-12 NOTE — Assessment & Plan Note (Signed)
Chronic.  Will hold off on taking lipids today as patient is not fasting.  Continue pravastatin 20 mg daily for now.  Follow-up in 3 months.

## 2020-07-12 NOTE — Addendum Note (Signed)
Addended by: Noemi Chapel A on: 07/12/2020 03:23 PM   Modules accepted: Orders

## 2020-07-12 NOTE — Assessment & Plan Note (Signed)
Chronic.  Blood pressure at goal today in clinic.  Continue amlodipine 5 mg, lisinopril 10 mg.  Kidney function with electrolytes checked today.  Encouraged DASH diet.  Follow-up in 3 months.

## 2020-07-12 NOTE — Assessment & Plan Note (Signed)
Chronic.  We will recheck A1c today.  Denies hypoglycemic episodes.  We will send in new prescription for blood sugar monitor with strips and lancets.  Continue current insulin regimen including NovoLog 6 units with meals during the day and Lantus 39 units at bedtime.  Encouraged scheduling an appointment with an ophthalmologist for an eye exam.  Follow-up in 3 months.

## 2020-07-12 NOTE — Assessment & Plan Note (Signed)
Kidney function has been borderline for some time.  We will check CMP with GFR today for close monitoring.  We will need to maximize diabetes and blood pressure control to prevent further kidney damage.  Encouraged drinking plenty of water and avoiding NSAIDs.

## 2020-07-12 NOTE — Assessment & Plan Note (Addendum)
Stable.  Appears euvolemic on exam today-no swelling or shortness of breath.  Weight is down 4 pounds, but overall stable.  Continue metoprolol 12.5 mg twice daily and furosemide 20 mg daily.  Follow-up in 3 months.

## 2020-07-12 NOTE — Assessment & Plan Note (Signed)
Chronic.  Alternates Tylenol and tramadol- it looks like about 40 tablets is lasting her a few months.  She follows with orthopedic-Dr. Ninfa Linden.  Continue collaboration with orthopedic.

## 2020-07-13 ENCOUNTER — Telehealth: Payer: Self-pay | Admitting: Nurse Practitioner

## 2020-07-13 NOTE — Telephone Encounter (Signed)
Called patient at about 4:45pm yesterday to tell her we have an occult stool kit for her to pick up. Spoke with patient's daughter who will pick up the kit.

## 2020-07-14 LAB — CBC WITH DIFFERENTIAL/PLATELET
Absolute Monocytes: 739 cells/uL (ref 200–950)
Basophils Absolute: 18 cells/uL (ref 0–200)
Basophils Relative: 0.2 %
Eosinophils Absolute: 107 cells/uL (ref 15–500)
Eosinophils Relative: 1.2 %
HCT: 35.6 % (ref 35.0–45.0)
Hemoglobin: 10.6 g/dL — ABNORMAL LOW (ref 11.7–15.5)
Lymphs Abs: 1798 cells/uL (ref 850–3900)
MCH: 23 pg — ABNORMAL LOW (ref 27.0–33.0)
MCHC: 29.8 g/dL — ABNORMAL LOW (ref 32.0–36.0)
MCV: 77.2 fL — ABNORMAL LOW (ref 80.0–100.0)
MPV: 10.5 fL (ref 7.5–12.5)
Monocytes Relative: 8.3 %
Neutro Abs: 6239 cells/uL (ref 1500–7800)
Neutrophils Relative %: 70.1 %
Platelets: 234 10*3/uL (ref 140–400)
RBC: 4.61 10*6/uL (ref 3.80–5.10)
RDW: 15.1 % — ABNORMAL HIGH (ref 11.0–15.0)
Total Lymphocyte: 20.2 %
WBC: 8.9 10*3/uL (ref 3.8–10.8)

## 2020-07-14 LAB — COMPLETE METABOLIC PANEL WITH GFR
AG Ratio: 1.6 (calc) (ref 1.0–2.5)
ALT: 12 U/L (ref 6–29)
AST: 15 U/L (ref 10–35)
Albumin: 3.9 g/dL (ref 3.6–5.1)
Alkaline phosphatase (APISO): 82 U/L (ref 37–153)
BUN/Creatinine Ratio: 21 (calc) (ref 6–22)
BUN: 21 mg/dL (ref 7–25)
CO2: 24 mmol/L (ref 20–32)
Calcium: 9.4 mg/dL (ref 8.6–10.4)
Chloride: 102 mmol/L (ref 98–110)
Creat: 1 mg/dL — ABNORMAL HIGH (ref 0.60–0.88)
GFR, Est African American: 60 mL/min/{1.73_m2} (ref >=60)
GFR, Est Non African American: 52 mL/min/{1.73_m2} — ABNORMAL LOW (ref >=60)
Globulin: 2.5 g/dL (calc) (ref 1.9–3.7)
Glucose, Bld: 87 mg/dL (ref 65–99)
Potassium: 4.9 mmol/L (ref 3.5–5.3)
Sodium: 135 mmol/L (ref 135–146)
Total Bilirubin: 0.3 mg/dL (ref 0.2–1.2)
Total Protein: 6.4 g/dL (ref 6.1–8.1)

## 2020-07-14 LAB — TEST AUTHORIZATION

## 2020-07-14 LAB — HEMOGLOBIN A1C
Hgb A1c MFr Bld: 6.9 % of total Hgb — ABNORMAL HIGH (ref <5.7)
Mean Plasma Glucose: 151 mg/dL
eAG (mmol/L): 8.4 mmol/L

## 2020-07-14 LAB — URIC ACID: Uric Acid, Serum: 5.5 mg/dL (ref 2.5–7.0)

## 2020-07-14 LAB — IRON,TIBC AND FERRITIN PANEL
%SAT: 6 % (calc) — ABNORMAL LOW (ref 16–45)
Ferritin: 9 ng/mL — ABNORMAL LOW (ref 16–288)
Iron: 23 ug/dL — ABNORMAL LOW (ref 45–160)
TIBC: 397 mcg/dL (calc) (ref 250–450)

## 2020-07-16 ENCOUNTER — Other Ambulatory Visit: Payer: Self-pay | Admitting: Nurse Practitioner

## 2020-07-16 DIAGNOSIS — D649 Anemia, unspecified: Secondary | ICD-10-CM | POA: Diagnosis not present

## 2020-07-19 LAB — FECAL GLOBIN BY IMMUNOCHEMISTRY
FECAL GLOBIN RESULT:: NOT DETECTED
MICRO NUMBER:: 11933523
SPECIMEN QUALITY:: ADEQUATE

## 2020-07-19 LAB — HOUSE ACCOUNT TRACKING

## 2020-07-31 ENCOUNTER — Other Ambulatory Visit: Payer: Self-pay | Admitting: *Deleted

## 2020-07-31 MED ORDER — IRON (FERROUS SULFATE) 325 (65 FE) MG PO TABS: 325.0000 mg | ORAL_TABLET | Freq: Every day | ORAL | 11 refills | Status: DC

## 2020-07-31 NOTE — Progress Notes (Signed)
Spoke with granddaughter Kathlee Nations, will bring for 1 month follow up, get iron needed. Not sure if pt is having blood in stool. Has not been mentioned. Will call and let pcp know

## 2020-08-01 ENCOUNTER — Telehealth: Payer: Self-pay

## 2020-08-01 ENCOUNTER — Other Ambulatory Visit: Payer: Self-pay | Admitting: Physician Assistant

## 2020-08-01 NOTE — Telephone Encounter (Signed)
Okay for refill, if persists or with any other symptoms, recommend virtual visit please

## 2020-08-03 MED ORDER — BENZONATATE 100 MG PO CAPS: 100.0000 mg | ORAL_CAPSULE | Freq: Two times a day (BID) | ORAL | 0 refills | Status: DC | PRN

## 2020-08-03 NOTE — Telephone Encounter (Signed)
Prescription sent to pharmacy.   Call placed to patient daughter Mickel Baas and made aware.

## 2020-08-06 ENCOUNTER — Ambulatory Visit: Payer: Medicaid Other | Admitting: Nurse Practitioner

## 2020-08-30 ENCOUNTER — Other Ambulatory Visit: Payer: Self-pay

## 2020-08-30 ENCOUNTER — Ambulatory Visit: Payer: Medicaid Other | Admitting: Nurse Practitioner

## 2020-08-30 VITALS — BP 102/68 | HR 65 | Temp 98.9°F | Ht <= 58 in | Wt 157.0 lb

## 2020-08-30 DIAGNOSIS — E1122 Type 2 diabetes mellitus with diabetic chronic kidney disease: Secondary | ICD-10-CM | POA: Diagnosis not present

## 2020-08-30 DIAGNOSIS — Z794 Long term (current) use of insulin: Secondary | ICD-10-CM

## 2020-08-30 DIAGNOSIS — N1831 Chronic kidney disease, stage 3a: Secondary | ICD-10-CM

## 2020-08-30 DIAGNOSIS — F4321 Adjustment disorder with depressed mood: Secondary | ICD-10-CM | POA: Diagnosis not present

## 2020-08-30 DIAGNOSIS — I129 Hypertensive chronic kidney disease with stage 1 through stage 4 chronic kidney disease, or unspecified chronic kidney disease: Secondary | ICD-10-CM | POA: Diagnosis not present

## 2020-08-30 DIAGNOSIS — N183 Chronic kidney disease, stage 3 unspecified: Secondary | ICD-10-CM

## 2020-08-30 DIAGNOSIS — N182 Chronic kidney disease, stage 2 (mild): Secondary | ICD-10-CM

## 2020-08-30 DIAGNOSIS — J4540 Moderate persistent asthma, uncomplicated: Secondary | ICD-10-CM

## 2020-08-30 DIAGNOSIS — G4700 Insomnia, unspecified: Secondary | ICD-10-CM

## 2020-08-30 DIAGNOSIS — I10 Essential (primary) hypertension: Secondary | ICD-10-CM

## 2020-08-30 DIAGNOSIS — M542 Cervicalgia: Secondary | ICD-10-CM

## 2020-08-30 MED ORDER — SERTRALINE HCL 25 MG PO TABS: 25.0000 mg | ORAL_TABLET | Freq: Every day | ORAL | 1 refills | Status: DC

## 2020-08-30 MED ORDER — ALBUTEROL SULFATE (2.5 MG/3ML) 0.083% IN NEBU: 2.5000 mg | INHALATION_SOLUTION | Freq: Four times a day (QID) | RESPIRATORY_TRACT | 1 refills | Status: DC | PRN

## 2020-08-30 MED ORDER — ALBUTEROL SULFATE HFA 108 (90 BASE) MCG/ACT IN AERS: 1.0000 | INHALATION_SPRAY | RESPIRATORY_TRACT | 3 refills | Status: DC | PRN

## 2020-08-30 MED ORDER — ARNUITY ELLIPTA 100 MCG/ACT IN AEPB: 1.0000 | INHALATION_SPRAY | Freq: Every day | RESPIRATORY_TRACT | 3 refills | Status: DC

## 2020-08-30 MED ORDER — NOVOLOG FLEXPEN 100 UNIT/ML ~~LOC~~ SOPN: PEN_INJECTOR | SUBCUTANEOUS | 1 refills | Status: DC

## 2020-08-30 MED ORDER — LANTUS SOLOSTAR 100 UNIT/ML ~~LOC~~ SOPN: PEN_INJECTOR | SUBCUTANEOUS | 5 refills | Status: DC

## 2020-08-30 MED ORDER — TIZANIDINE HCL 2 MG PO CAPS: 2.0000 mg | ORAL_CAPSULE | Freq: Three times a day (TID) | ORAL | 1 refills | Status: DC

## 2020-08-30 NOTE — Patient Instructions (Addendum)
We are checking labs today.   Please report back with blood sugar readings in 2 weeks.  May need to increase insulin.  Keep the same for now.  Let's follow up to see how things are going in 1 month.

## 2020-08-30 NOTE — Progress Notes (Signed)
Subjective:    Patient ID: Joyce Gilmore, female    DOB: 06-May-1934, 86 y.o.   MRN: [id number]  HPI: Joyce Gilmore is a 86 y.o. female presenting with granddaughter, Joyce Gilmore, for neck pain and anxiety.  Chief Complaint  Patient presents with   Follow-up    Says blood in stool in no more, is having pain in head and neck, feels she under lots of stress. Does have anxiety at time, goes walking in the neighborhood to cope with anxiety   NECK PAIN Onset: months Location: both sides Duration:months, comes and goes quickly Severity: medium Quality: lightening Frequency: comes and goes Radiation: none Aggravating factors: sleeping on that side Alleviating factors: cream from [place]  Status: stable Treatments attempted:  cream from [place]  Relief with NSAIDs?:  No NSAIDs Taken Weakness:  no Paresthesias / decreased sensation:  no  Fevers:  no  ANXIETY/STRESS Patient reports increased worry and stress with family who is in [place].  It sounds like her brother is not doing very well.  She also recently lost a grandchild who lives in [place].  Right now, when she feels anxious she goes for a walk through her neighborhood and this does help some. Duration: Acute Anxious mood: yes  Excessive worrying: yes Irritability:  no Sweating: no Nausea: no Palpitations:no Hyperventilation: no Panic attacks: no Agoraphobia: no  Obscessions/compulsions: no Depressed mood: yes Depression screen [facility] 2/9 08/30/2020 04/02/2020 03/22/2019 12/10/2018 10/12/2017  Decreased Interest 1 0 0 0 0  Down, Depressed, Hopeless 1 0 0 1 0  PHQ - 2 Score 2 0 0 1 0  Altered sleeping 1 - - 0 -  Tired, decreased energy 1 - - 0 -  Change in appetite 2 - - 0 -  Feeling bad or failure about yourself  0 - - 0 -  Trouble concentrating 0 - - 0 -  Moving slowly or fidgety/restless 0 - - 0 -  Suicidal thoughts 0 - - 0 -  PHQ-9 Score 6 - - 1 -  Difficult doing work/chores Somewhat difficult - - Not difficult at all -   Some recent data might be hidden    GAD 7 : Generalized Anxiety Score 08/30/2020  Nervous, Anxious, on Edge 1  Control/stop worrying 1  Worry too much - different things 1  Trouble relaxing 0  Restless 1  Easily annoyed or irritable 0  Afraid - awful might happen 0  Total GAD 7 Score 4   Anhedonia: yes Weight changes: no Insomnia: no   Hypersomnia: no Fatigue/loss of energy: yes Feelings of worthlessness: no Feelings of guilt: no Impaired concentration/indecisiveness: no Suicidal ideations: no  Crying spells: no Recent Stressors/Life Changes: yes   Relationship problems: no   Family stress: yes     Financial stress: no    Job stress: no    Recent death/loss: yes  DIABETES She lost her log while in [place].  Has been checking blood sugar but not writing it down.  Taking 38 units of Lantus at bedtime and 6 units of NovoLog with each meal.  She did not bring her meter with her today. Hypoglycemic episodes:no Polydipsia/polyuria: no Visual disturbance: no Chest pain: no Paresthesias: no Retinal Examination: Not up to Date Foot Exam: Up to Date Diabetic Education: Completed Pneumovax: Up to Date Influenza: Up to Date Aspirin: yes  Ran out of inhaler -history of asthma and uses albuterol as needed when she feels short of breath.  Does report she is feeling more  short of breath than normal. Requesting refill today.    She reports she was having some blood in her stool, however this is gotten better.  She does not really want to talk about this more today.  She has finished the iron and is wondering if she needs to continue it.  She is wondering if it would be safe for her to travel to [place].  Since her brother is sick, she wants to go and see him in the next couple of weeks.  Allergies  Allergen Reactions   Bactrim [Sulfamethoxazole-Trimethoprim] Other (See Comments)    Hyperkalemia (elevated potassium)   Rifampin Other (See Comments)    Severe thrombocytopenia (low  blood platelet count)   Vancomycin Other (See Comments)    Severe thrombocytopenia (low blood platelet count)    Outpatient Encounter Medications as of 08/30/2020  Medication Sig   Acetaminophen (TYLENOL PO) Take 1,000 tablets by mouth every 6 (six) hours as needed (pain/headache).    amLODipine (NORVASC) 5 MG tablet TAKE 1 TABLET(5 MG) BY MOUTH DAILY   aspirin (ASPIRIN 81) 81 MG EC tablet Take 1 tablet (81 mg total) by mouth daily. Swallow whole.   Blood Glucose Monitoring Suppl (BLOOD GLUCOSE SYSTEM PAK) KIT Please dispense based on patient and insurance preference.  Use as directed to monitor FSBS four times daily.  Dx E11.22   Calcium Carbonate-Vit D-Min (CALCIUM 1200 PO) Take by mouth.   furosemide (LASIX) 20 MG tablet Take 1 tablet (20 mg total) by mouth daily.   glucose blood test strip Please dispense based on patient and insurance preference.  Use as directed to monitor FSBS four times daily.  Dx E11.22   Iron, Ferrous Sulfate, 325 (65 Fe) MG TABS Take 325 mg by mouth daily. TAKE IN AM WITH ORANGE JUICE.   Lancets MISC Please dispense based on patient and insurance preference.  Use as directed to monitor FSBS four times daily.  Dx E11.22   lansoprazole (PREVACID) 30 MG capsule TAKE ONE CAPSULE BY MOUTH DAILY AT 12 NOON   lisinopril (ZESTRIL) 10 MG tablet Take 1 tablet (10 mg total) by mouth daily.   loratadine (CLARITIN) 10 MG tablet TAKE 1 TABLET(10 MG) BY MOUTH DAILY AS NEEDED FOR ALLERGIES   metoprolol tartrate (LOPRESSOR) 25 MG tablet TAKE 1/2 TABLET(12.5 MG) BY MOUTH TWICE DAILY   MITIGARE 0.6 MG CAPS TAKE 1 CAPSULE BY MOUTH DAILY   polyvinyl alcohol (ARTIFICIAL TEARS) 1.4 % ophthalmic solution Place 1 drop into both eyes as needed for dry eyes.   pravastatin (PRAVACHOL) 20 MG tablet TAKE 1 TABLET(20 MG) BY MOUTH DAILY   sertraline (ZOLOFT) 25 MG tablet Take 1 tablet (25 mg total) by mouth daily.   sucralfate (CARAFATE) 1 g tablet TAKE 1 TABLET(1 GRAM) BY MOUTH FOUR TIMES DAILY    tizanidine (ZANAFLEX) 2 MG capsule Take 1 capsule (2 mg total) by mouth 3 (three) times daily.   traMADol (ULTRAM) 50 MG tablet Take 1-2 tablets (50-100 mg total) by mouth every 6 (six) hours as needed.   [DISCONTINUED] albuterol (PROVENTIL) (2.5 MG/3ML) 0.083% nebulizer solution Take 3 mLs (2.5 mg total) by nebulization every 6 (six) hours as needed for wheezing or shortness of breath.   [DISCONTINUED] albuterol (VENTOLIN HFA) 108 (90 Base) MCG/ACT inhaler Inhale 1-2 puffs into the lungs every 4 (four) hours as needed for wheezing or shortness of breath.   [DISCONTINUED] benzonatate (TESSALON) 100 MG capsule Take 1 capsule (100 mg total) by mouth 2 (two) times daily as needed  for cough.   [DISCONTINUED] Fluticasone Furoate (ARNUITY ELLIPTA) 100 MCG/ACT AEPB Inhale 1 puff into the lungs daily.   [DISCONTINUED] insulin aspart (NOVOLOG FLEXPEN) 100 UNIT/ML FlexPen ADMINISTER 5 TO 15 UNITS UNDER THE SKIN THREE TIMES DAILY WITH MEALS PER SLIDING SCALE   [DISCONTINUED] insulin glargine (LANTUS SOLOSTAR) 100 UNIT/ML Solostar Pen INJECT  38 UNITS UNDER THE SKIN AT BEDTIME   insulin aspart (NOVOLOG FLEXPEN) 100 UNIT/ML FlexPen ADMINISTER 5 TO 15 UNITS UNDER THE SKIN THREE TIMES DAILY WITH MEALS PER SLIDING SCALE   insulin glargine (LANTUS SOLOSTAR) 100 UNIT/ML Solostar Pen INJECT  38 UNITS UNDER THE SKIN AT BEDTIME   [DISCONTINUED] Fluticasone Furoate (ARNUITY ELLIPTA) 100 MCG/ACT AEPB Inhale 1 puff into the lungs daily. Rinse mouth and spit after each use.   No facility-administered encounter medications on file as of 08/30/2020.    Patient Active Problem List   Diagnosis Date Noted   Type 2 DM with CKD stage 3 and hypertension (HCC) 07/12/2020   Chronic kidney disease 05/07/2018   GERD (gastroesophageal reflux disease) 03/10/2017   Uses hearing aid 09/12/2016   Asthmatic bronchitis 03/11/2016   Chronic diastolic heart failure (HCC) 10/12/2015   Seasonal allergies 11/21/2014   DDD (degenerative disc  disease), lumbar 10/11/2014   H/O compression fracture of spine 10/11/2014   Pulmonary hypertension (HCC)    Hepatic cirrhosis (HCC) 09/24/2014   SIADH (syndrome of inappropriate ADH production) (HCC) 06/13/2014   Insomnia 12/28/2013   Anemia 07/06/2013   HLD (hyperlipidemia) 06/27/2013   Osteoporosis 07/26/2012   Hyponatremia 05/16/2011   Type 2 diabetes mellitus with diabetic chronic kidney disease (HCC) 08/17/2006   Essential hypertension 08/17/2006   DIVERTICULOSIS, COLON 08/17/2006    Past Medical History:  Diagnosis Date   Arthritis    CHF (congestive heart failure) (HCC)    diastolic   Chronic kidney disease    hx of kidney stones,    Depression    hx of    Diabetes mellitus without complication (HCC)    Ganglion, left ankle and foot    Hearing loss of both ears    Hypertension    MRSA infection    Oct 13 - Nov 13   Osteoporosis    Tinnitus of both ears     Relevant past medical, surgical, family and social history reviewed and updated as indicated. Interim medical history since our last visit reviewed.  Review of Systems  Constitutional: Negative.   HENT: Negative.    Eyes: Negative.   Respiratory:  Positive for shortness of breath. Negative for cough, chest tightness and wheezing.   Cardiovascular: Negative.   Gastrointestinal: Negative.  Negative for abdominal distention, abdominal pain, blood in stool, constipation, diarrhea and vomiting.  Musculoskeletal:  Positive for neck pain. Negative for back pain, myalgias and neck stiffness.  Skin: Negative.   Neurological:  Negative for dizziness, light-headedness and headaches.  Psychiatric/Behavioral:  Negative for sleep disturbance and suicidal ideas. The patient is nervous/anxious.    Per HPI unless specifically indicated above     Objective:    BP 102/68   Pulse 65   Temp 98.9 F (37.2 C)   Ht 4\' 8"  (1.422 m)   Wt 157 lb (71.2 kg)   SpO2 99%   BMI 35.20 kg/m   Wt Readings from Last 3 Encounters:   08/30/20 157 lb (71.2 kg)  07/11/20 154 lb (69.9 kg)  04/02/20 158 lb (71.7 kg)    Physical Exam Vitals and nursing note reviewed.  Constitutional:  General: She is not in acute distress.    Appearance: Normal appearance. She is obese. She is not toxic-appearing.  HENT:     Head: Normocephalic and atraumatic.     Right Ear: Tympanic membrane, ear canal and external ear normal.     Left Ear: Tympanic membrane, ear canal and external ear normal.     Nose: Nose normal. No congestion.     Mouth/Throat:     Mouth: Mucous membranes are moist.     Pharynx: Oropharynx is clear. No oropharyngeal exudate or posterior oropharyngeal erythema.  Eyes:     General: No scleral icterus.    Extraocular Movements: Extraocular movements intact.  Cardiovascular:     Rate and Rhythm: Normal rate and regular rhythm.     Heart sounds: Normal heart sounds. No murmur heard. Pulmonary:     Effort: Pulmonary effort is normal. No respiratory distress.     Breath sounds: Normal breath sounds. No wheezing, rhonchi or rales.  Abdominal:     General: Abdomen is flat. Bowel sounds are normal.     Palpations: Abdomen is soft.  Musculoskeletal:        General: Normal range of motion.     Cervical back: Full passive range of motion without pain and normal range of motion. Muscular tenderness present.     Right lower leg: No edema.     Left lower leg: No edema.  Lymphadenopathy:     Cervical: No cervical adenopathy.  Skin:    General: Skin is warm and dry.     Capillary Refill: Capillary refill takes less than 2 seconds.     Coloration: Skin is not jaundiced or pale.     Findings: No erythema.  Neurological:     Mental Status: She is alert and oriented to person, place, and time.  Psychiatric:        Mood and Affect: Mood normal.        Behavior: Behavior normal.        Thought Content: Thought content normal.        Judgment: Judgment normal.      Assessment & Plan:   Problem List Items  Addressed This Visit       Cardiovascular and Mediastinum   Type 2 DM with CKD stage 3 and hypertension (HCC)    Chronic.  Did not bring blood sugar meter with her today.  We will recheck A1c today, patient to keep a log of blood sugar readings for the next 2 weeks and report them to [place] in case we need to adjust her insulin, we need to know where to adjust to that.  Overdue for eye exam-referral has been placed previously.  Foot exam is up-to-date.  Follow-up follow-up in 3 months.       Relevant Medications   insulin glargine (LANTUS SOLOSTAR) 100 UNIT/ML Solostar Pen   insulin aspart (NOVOLOG FLEXPEN) 100 UNIT/ML FlexPen   Essential hypertension    Chronic.  Blood pressure at goal today in clinic.  Continue current medications including amlodipine 5 mg daily and lisinopril 10 mg daily.  We will check kidney function with electrolytes.  Continue dietary and lifestyle changes including physical activity and limiting salt in diet.  Follow-up in 3 months.       Relevant Orders   CBC with Differential (Completed)   COMPLETE METABOLIC PANEL WITH GFR (Completed)     Respiratory   Asthmatic bronchitis    Continue Arnuity for prevention of flares and albuterol as needed.  Refills given today.  Follow-up if symptoms do not improve once resuming Arnuity.         Endocrine   Type 2 diabetes mellitus with diabetic chronic kidney disease ([place]) - Primary    Chronic.  Did not bring blood sugar meter with her today.  We will recheck A1c today, patient to keep a log of blood sugar readings for the next 2 weeks and report them to [place] in case we need to adjust her insulin, we need to know where to adjust to that.  Overdue for eye exam-referral has been placed previously.  Foot exam is up-to-date.  Follow-up follow-up in 3 months.       Relevant Medications   insulin glargine (LANTUS SOLOSTAR) 100 UNIT/ML Solostar Pen   insulin aspart (NOVOLOG FLEXPEN) 100 UNIT/ML FlexPen   Other Relevant Orders    CBC with Differential (Completed)   Hemoglobin A1c (Completed)     Genitourinary   Chronic kidney disease    Chronic.  We will recheck kidney function today.  Encouraged drinking plenty of water and avoiding NSAIDs to prevent further kidney damage.       Relevant Orders   CBC with Differential (Completed)   COMPLETE METABOLIC PANEL WITH GFR (Completed)   Other Visit Diagnoses     Neck pain       Acute.  Consistent with muscle spasms-we will send in tizanidine to use sparingly and at nighttime.  Follow-up with no improvement.   Relevant Medications   tizanidine (ZANAFLEX) 2 MG capsule   Grief       PHQ-9 slightly elevated today.  No SI.  Declines therapy, willing to start medication.  Start sertraline 25 mg daily-educated on side effects.  Follow-up 1 mont   Relevant Medications   sertraline (ZOLOFT) 25 MG tablet     I did talk with the patient about traveling to [place] and I do not think it be the best time for her to travel this long distance because of her breathing and other comorbid diseases.  Of course, she is free to make her own decision, however I gave her my best recommendation.  Follow up plan: Return in about 4 weeks (around 09/27/2020) for mood, neck pain.

## 2020-08-31 LAB — COMPLETE METABOLIC PANEL WITH GFR
AG Ratio: 1.5 (calc) (ref 1.0–2.5)
ALT: 15 U/L (ref 6–29)
AST: 20 U/L (ref 10–35)
Albumin: 4.1 g/dL (ref 3.6–5.1)
Alkaline phosphatase (APISO): 79 U/L (ref 37–153)
BUN/Creatinine Ratio: 20 (calc) (ref 6–22)
BUN: 24 mg/dL (ref 7–25)
CO2: 27 mmol/L (ref 20–32)
Calcium: 9.5 mg/dL (ref 8.6–10.4)
Chloride: 97 mmol/L — ABNORMAL LOW (ref 98–110)
Creat: 1.18 mg/dL — ABNORMAL HIGH (ref 0.60–0.88)
GFR, Est African American: 49 mL/min/{1.73_m2} — ABNORMAL LOW (ref >=60)
GFR, Est Non African American: 43 mL/min/{1.73_m2} — ABNORMAL LOW (ref >=60)
Globulin: 2.8 g/dL (calc) (ref 1.9–3.7)
Glucose, Bld: 118 mg/dL — ABNORMAL HIGH (ref 65–99)
Potassium: 5 mmol/L (ref 3.5–5.3)
Sodium: 130 mmol/L — ABNORMAL LOW (ref 135–146)
Total Bilirubin: 0.3 mg/dL (ref 0.2–1.2)
Total Protein: 6.9 g/dL (ref 6.1–8.1)

## 2020-08-31 LAB — CBC WITH DIFFERENTIAL/PLATELET
Absolute Monocytes: 589 cells/uL (ref 200–950)
Basophils Absolute: 83 cells/uL (ref 0–200)
Basophils Relative: 1 %
Eosinophils Absolute: 299 cells/uL (ref 15–500)
Eosinophils Relative: 3.6 %
HCT: 36 % (ref 35.0–45.0)
Hemoglobin: 11.6 g/dL — ABNORMAL LOW (ref 11.7–15.5)
Lymphs Abs: 2681 cells/uL (ref 850–3900)
MCH: 25.8 pg — ABNORMAL LOW (ref 27.0–33.0)
MCHC: 32.2 g/dL (ref 32.0–36.0)
MCV: 80 fL (ref 80.0–100.0)
MPV: 10.3 fL (ref 7.5–12.5)
Monocytes Relative: 7.1 %
Neutro Abs: 4648 cells/uL (ref 1500–7800)
Neutrophils Relative %: 56 %
Platelets: 248 10*3/uL (ref 140–400)
RBC: 4.5 10*6/uL (ref 3.80–5.10)
RDW: 19.5 % — ABNORMAL HIGH (ref 11.0–15.0)
Total Lymphocyte: 32.3 %
WBC: 8.3 10*3/uL (ref 3.8–10.8)

## 2020-08-31 LAB — HEMOGLOBIN A1C
Hgb A1c MFr Bld: 6.6 % of total Hgb — ABNORMAL HIGH (ref <5.7)
Mean Plasma Glucose: 143 mg/dL
eAG (mmol/L): 7.9 mmol/L

## 2020-08-31 NOTE — Progress Notes (Signed)
Spoke with Kathlee Nations, pt grand daughter as instructed by pt. She is aware of iron pills to be picked up from pharmacy, increase of water for pt kidney functioons and bringing log or glucose meter to nx appt

## 2020-09-01 ENCOUNTER — Encounter: Payer: Self-pay | Admitting: Nurse Practitioner

## 2020-09-01 NOTE — Assessment & Plan Note (Signed)
Chronic.  Blood pressure at goal today in clinic.  Continue current medications including amlodipine 5 mg daily and lisinopril 10 mg daily.  We will check kidney function with electrolytes.  Continue dietary and lifestyle changes including physical activity and limiting salt in diet.  Follow-up in 3 months.

## 2020-09-01 NOTE — Assessment & Plan Note (Signed)
Chronic.  We will recheck kidney function today.  Encouraged drinking plenty of water and avoiding NSAIDs to prevent further kidney damage.

## 2020-09-01 NOTE — Assessment & Plan Note (Signed)
Continue Arnuity for prevention of flares and albuterol as needed.  Refills given today.  Follow-up if symptoms do not improve once resuming Arnuity.

## 2020-09-01 NOTE — Assessment & Plan Note (Signed)
Chronic.  Did not bring blood sugar meter with her today.  We will recheck A1c today, patient to keep a log of blood sugar readings for the next 2 weeks and report them to [place] in case we need to adjust her insulin, we need to know where to adjust to that.  Overdue for eye exam-referral has been placed previously.  Foot exam is up-to-date.  Follow-up follow-up in 3 months.

## 2020-09-03 NOTE — Progress Notes (Signed)
Spoke with grand daughter regarding pt labs results

## 2020-09-05 ENCOUNTER — Other Ambulatory Visit: Payer: Self-pay

## 2020-09-07 ENCOUNTER — Emergency Department (HOSPITAL_COMMUNITY)
Admission: EM | Admit: 2020-09-07 | Discharge: 2020-09-08 | Disposition: A | Discharge status: Home / Self Care | Payer: Medicaid Other | Attending: Emergency Medicine | Admitting: Emergency Medicine

## 2020-09-07 ENCOUNTER — Telehealth: Payer: Self-pay

## 2020-09-07 ENCOUNTER — Encounter (HOSPITAL_COMMUNITY): Payer: Self-pay

## 2020-09-07 ENCOUNTER — Encounter (HOSPITAL_COMMUNITY): Payer: Self-pay | Admitting: Student

## 2020-09-07 ENCOUNTER — Emergency Department (HOSPITAL_COMMUNITY): Payer: Medicaid Other

## 2020-09-07 ENCOUNTER — Ambulatory Visit (HOSPITAL_COMMUNITY)
Admission: EM | Admit: 2020-09-07 | Discharge: 2020-09-07 | Disposition: A | Discharge status: Home / Self Care | Payer: Medicaid Other | Attending: Medical Oncology | Admitting: Medical Oncology

## 2020-09-07 ENCOUNTER — Other Ambulatory Visit: Payer: Self-pay

## 2020-09-07 DIAGNOSIS — R519 Headache, unspecified: Secondary | ICD-10-CM | POA: Diagnosis not present

## 2020-09-07 DIAGNOSIS — J9811 Atelectasis: Secondary | ICD-10-CM | POA: Diagnosis not present

## 2020-09-07 DIAGNOSIS — R0600 Dyspnea, unspecified: Secondary | ICD-10-CM | POA: Diagnosis not present

## 2020-09-07 DIAGNOSIS — R0602 Shortness of breath: Secondary | ICD-10-CM

## 2020-09-07 DIAGNOSIS — Z20822 Contact with and (suspected) exposure to covid-19: Secondary | ICD-10-CM | POA: Diagnosis not present

## 2020-09-07 DIAGNOSIS — R059 Cough, unspecified: Secondary | ICD-10-CM | POA: Insufficient documentation

## 2020-09-07 DIAGNOSIS — I130 Hypertensive heart and chronic kidney disease with heart failure and stage 1 through stage 4 chronic kidney disease, or unspecified chronic kidney disease: Secondary | ICD-10-CM | POA: Diagnosis not present

## 2020-09-07 DIAGNOSIS — R0789 Other chest pain: Secondary | ICD-10-CM | POA: Diagnosis not present

## 2020-09-07 DIAGNOSIS — E785 Hyperlipidemia, unspecified: Secondary | ICD-10-CM | POA: Insufficient documentation

## 2020-09-07 DIAGNOSIS — N183 Chronic kidney disease, stage 3 unspecified: Secondary | ICD-10-CM | POA: Insufficient documentation

## 2020-09-07 DIAGNOSIS — E871 Hypo-osmolality and hyponatremia: Secondary | ICD-10-CM | POA: Diagnosis not present

## 2020-09-07 DIAGNOSIS — Z79899 Other long term (current) drug therapy: Secondary | ICD-10-CM | POA: Diagnosis not present

## 2020-09-07 DIAGNOSIS — Z96641 Presence of right artificial hip joint: Secondary | ICD-10-CM | POA: Diagnosis not present

## 2020-09-07 DIAGNOSIS — I5032 Chronic diastolic (congestive) heart failure: Secondary | ICD-10-CM | POA: Insufficient documentation

## 2020-09-07 DIAGNOSIS — Z96653 Presence of artificial knee joint, bilateral: Secondary | ICD-10-CM | POA: Diagnosis not present

## 2020-09-07 DIAGNOSIS — Z7951 Long term (current) use of inhaled steroids: Secondary | ICD-10-CM | POA: Diagnosis not present

## 2020-09-07 DIAGNOSIS — E1169 Type 2 diabetes mellitus with other specified complication: Secondary | ICD-10-CM | POA: Diagnosis not present

## 2020-09-07 DIAGNOSIS — R0609 Other forms of dyspnea: Secondary | ICD-10-CM | POA: Diagnosis not present

## 2020-09-07 DIAGNOSIS — E1122 Type 2 diabetes mellitus with diabetic chronic kidney disease: Secondary | ICD-10-CM | POA: Diagnosis not present

## 2020-09-07 DIAGNOSIS — R079 Chest pain, unspecified: Secondary | ICD-10-CM | POA: Diagnosis not present

## 2020-09-07 DIAGNOSIS — I517 Cardiomegaly: Secondary | ICD-10-CM | POA: Diagnosis not present

## 2020-09-07 DIAGNOSIS — K449 Diaphragmatic hernia without obstruction or gangrene: Secondary | ICD-10-CM | POA: Diagnosis not present

## 2020-09-07 DIAGNOSIS — Z794 Long term (current) use of insulin: Secondary | ICD-10-CM | POA: Insufficient documentation

## 2020-09-07 LAB — CBC WITH DIFFERENTIAL/PLATELET
Abs Immature Granulocytes: 0.03 10*3/uL (ref 0.00–0.07)
Basophils Absolute: 0.1 10*3/uL (ref 0.0–0.1)
Basophils Relative: 1 %
Eosinophils Absolute: 0.3 10*3/uL (ref 0.0–0.5)
Eosinophils Relative: 3 %
HCT: 35 % — ABNORMAL LOW (ref 36.0–46.0)
Hemoglobin: 11.6 g/dL — ABNORMAL LOW (ref 12.0–15.0)
Immature Granulocytes: 0 %
Lymphocytes Relative: 29 %
Lymphs Abs: 2.3 10*3/uL (ref 0.7–4.0)
MCH: 26.4 pg (ref 26.0–34.0)
MCHC: 33.1 g/dL (ref 30.0–36.0)
MCV: 79.5 fL — ABNORMAL LOW (ref 80.0–100.0)
Monocytes Absolute: 0.7 10*3/uL (ref 0.1–1.0)
Monocytes Relative: 9 %
Neutro Abs: 4.5 10*3/uL (ref 1.7–7.7)
Neutrophils Relative %: 58 %
Platelets: 220 10*3/uL (ref 150–400)
RBC: 4.4 MIL/uL (ref 3.87–5.11)
RDW: 20.5 % — ABNORMAL HIGH (ref 11.5–15.5)
WBC: 7.9 10*3/uL (ref 4.0–10.5)
nRBC: 0 % (ref 0.0–0.2)

## 2020-09-07 LAB — COMPREHENSIVE METABOLIC PANEL
ALT: 18 U/L (ref 0–44)
AST: 24 U/L (ref 15–41)
Albumin: 3.9 g/dL (ref 3.5–5.0)
Alkaline Phosphatase: 80 U/L (ref 38–126)
Anion gap: 9 (ref 5–15)
BUN: 25 mg/dL — ABNORMAL HIGH (ref 8–23)
CO2: 25 mmol/L (ref 22–32)
Calcium: 9.3 mg/dL (ref 8.9–10.3)
Chloride: 90 mmol/L — ABNORMAL LOW (ref 98–111)
Creatinine, Ser: 0.94 mg/dL (ref 0.44–1.00)
GFR, Estimated: >60 mL/min (ref >60)
Glucose, Bld: 187 mg/dL — ABNORMAL HIGH (ref 70–99)
Potassium: 5.1 mmol/L (ref 3.5–5.1)
Sodium: 124 mmol/L — ABNORMAL LOW (ref 135–145)
Total Bilirubin: 0.3 mg/dL (ref 0.3–1.2)
Total Protein: 7.1 g/dL (ref 6.5–8.1)

## 2020-09-07 LAB — TROPONIN I (HIGH SENSITIVITY)
Troponin I (High Sensitivity): 9 ng/L (ref <18)
Troponin I (High Sensitivity): 8 ng/L (ref <18)

## 2020-09-07 LAB — BRAIN NATRIURETIC PEPTIDE: B Natriuretic Peptide: 63.2 pg/mL (ref 0.0–100.0)

## 2020-09-07 MED ORDER — LIDOCAINE 5 % EX PTCH
1.0000 | MEDICATED_PATCH | CUTANEOUS | Status: DC
  Administered 2020-09-08: 1 via TRANSDERMAL
  Filled 2020-09-07: qty 1

## 2020-09-07 MED ORDER — ACETAMINOPHEN 325 MG PO TABS
650.0000 mg | ORAL_TABLET | Freq: Once | ORAL | Status: AC
  Administered 2020-09-08: 650 mg via ORAL
  Filled 2020-09-07: qty 2

## 2020-09-07 NOTE — ED Provider Notes (Signed)
[facility]    CSN: [id number] Arrival date & time: 09/07/20  1919      History   Chief Complaint Chief Complaint  Patient presents with   Chest Pain   Fatigue   Shortness of Breath    HPI Joyce Gilmore is a 86 y.o. female.   HPI  Chest pain and shortness of breath: Patient presents with her daughter who helps assist with translation with assistance of Adriana.  Patient's daughter states that she woke up around 8 this morning and had shortness of breath and chest discomfort.  Chest discomfort is sternal in nature.  She also has noticed a little bit of acid reflux and some arm pain.  They state that these are all new symptoms for her.  She does have a history of a cough that she has had for 4 weeks but this is unchanged and not new.  No recent fevers.  She has been acting like her normal self.  They have not tried anything for symptoms.  Of note she does have a history of CHF and diabetes.  Past Medical History:  Diagnosis Date   Arthritis    CHF (congestive heart failure) (HCC)    diastolic   Chronic kidney disease    hx of kidney stones,    Depression    hx of    Diabetes mellitus without complication (HCC)    Ganglion, left ankle and foot    Hearing loss of both ears    Hypertension    MRSA infection    Oct 13 - Nov 13   Osteoporosis    Tinnitus of both ears     Patient Active Problem List   Diagnosis Date Noted   Type 2 DM with CKD stage 3 and hypertension ([place]) 07/12/2020   Chronic kidney disease 05/07/2018   GERD (gastroesophageal reflux disease) 03/10/2017   Uses hearing aid 09/12/2016   Asthmatic bronchitis 03/11/2016   Chronic diastolic heart failure ([place]) 10/12/2015   Seasonal allergies 11/21/2014   DDD (degenerative disc disease), lumbar 10/11/2014   H/O compression fracture of spine 10/11/2014   Pulmonary hypertension ([place])    Hepatic cirrhosis ([place]) 09/24/2014   SIADH (syndrome of inappropriate ADH production) ([place]) 06/13/2014    Insomnia 12/28/2013   Anemia 07/06/2013   HLD (hyperlipidemia) 06/27/2013   Osteoporosis 07/26/2012   Hyponatremia 05/16/2011   Type 2 diabetes mellitus with diabetic chronic kidney disease ([place]) 08/17/2006   Essential hypertension 08/17/2006   DIVERTICULOSIS, COLON 08/17/2006    Past Surgical History:  Procedure Laterality Date   INCISION AND DRAINAGE HIP  12/22/2011   Procedure: IRRIGATION AND DEBRIDEMENT HIP;  Surgeon: Mcarthur Rossetti, MD;  Location: [facility];  Service: Orthopedics;  Laterality: Right;  Irrigation and debridement right hip   INCISION AND DRAINAGE HIP  12/27/2011   Procedure: IRRIGATION AND DEBRIDEMENT HIP;  Surgeon: Mcarthur Rossetti, MD;  Location: [facility];  Service: Orthopedics;  Laterality: Right;  Repeat irrigation and debridement   JOINT REPLACEMENT     bilateral knee, right hip    MASS EXCISION N/A 01/03/2020   Procedure: EXCISION OF HEMANGIOMA OF THE PALATE;  Surgeon: Leta Baptist, MD;  Location: [facility];  Service: ENT;  Laterality: N/A;   TOTAL HIP REVISION  12/05/2011   Procedure: TOTAL HIP REVISION;  Surgeon: Mcarthur Rossetti, MD;  Location: [facility];  Service: Orthopedics;  Laterality: Right;  Right Hip Revision Arthroplasty to Total Hip, Excision of Old Implant  OB History   No obstetric history on file.      Home Medications    Prior to Admission medications   Medication Sig Start Date End Date Taking? Authorizing Provider  Acetaminophen (TYLENOL PO) Take 1,000 tablets by mouth every 6 (six) hours as needed (pain/headache).     [provider]  albuterol (PROVENTIL) (2.5 MG/3ML) 0.083% nebulizer solution Take 3 mLs (2.5 mg total) by nebulization every 6 (six) hours as needed for wheezing or shortness of breath. 08/30/20   Valentino Nose, NP  albuterol (VENTOLIN HFA) 108 (90 Base) MCG/ACT inhaler Inhale 1-2 puffs into the lungs every 4 (four) hours as needed for wheezing or shortness of breath. 08/30/20   Valentino Nose, NP  amLODipine (NORVASC) 5 MG tablet TAKE 1 TABLET(5 MG) BY MOUTH DAILY 06/11/20   Valentino Nose, NP  aspirin (ASPIRIN 81) 81 MG EC tablet Take 1 tablet (81 mg total) by mouth daily. Swallow whole. 01/09/20   Salley Scarlet, MD  Blood Glucose Monitoring Suppl (BLOOD GLUCOSE SYSTEM PAK) KIT Please dispense based on patient and insurance preference.  Use as directed to monitor FSBS four times daily.  Dx E11.22 07/12/20   Valentino Nose, NP  Calcium Carbonate-Vit D-Min (CALCIUM 1200 PO) Take by mouth.    [provider]  Fluticasone Furoate (ARNUITY ELLIPTA) 100 MCG/ACT AEPB Inhale 1 puff into the lungs daily. Rinse mouth and spit after each use. 08/30/20   Valentino Nose, NP  furosemide (LASIX) 20 MG tablet Take 1 tablet (20 mg total) by mouth daily. 06/11/20   Valentino Nose, NP  glucose blood test strip Please dispense based on patient and insurance preference.  Use as directed to monitor FSBS four times daily.  Dx E11.22 07/12/20   Valentino Nose, NP  insulin aspart (NOVOLOG FLEXPEN) 100 UNIT/ML FlexPen ADMINISTER 5 TO 15 UNITS UNDER THE SKIN THREE TIMES DAILY WITH MEALS PER SLIDING SCALE 08/30/20   Valentino Nose, NP  insulin glargine (LANTUS SOLOSTAR) 100 UNIT/ML Solostar Pen INJECT  38 UNITS UNDER THE SKIN AT BEDTIME 08/30/20   Valentino Nose, NP  Iron, Ferrous Sulfate, 325 (65 Fe) MG TABS Take 325 mg by mouth daily. TAKE IN AM WITH ORANGE JUICE. 07/31/20   Valentino Nose, NP  Lancets MISC Please dispense based on patient and insurance preference.  Use as directed to monitor FSBS four times daily.  Dx E11.22 07/12/20   Valentino Nose, NP  lansoprazole (PREVACID) 30 MG capsule TAKE ONE CAPSULE BY MOUTH DAILY AT 12 NOON 06/21/20   Donita Brooks, MD  lisinopril (ZESTRIL) 10 MG tablet Take 1 tablet (10 mg total) by mouth daily. 06/11/20   Valentino Nose, NP  loratadine (CLARITIN) 10 MG tablet TAKE 1 TABLET(10 MG) BY MOUTH DAILY AS NEEDED  FOR ALLERGIES 11/15/19   Salley Scarlet, MD  metoprolol tartrate (LOPRESSOR) 25 MG tablet TAKE 1/2 TABLET(12.5 MG) BY MOUTH TWICE DAILY 06/11/20   Valentino Nose, NP  MITIGARE 0.6 MG CAPS TAKE 1 CAPSULE BY MOUTH DAILY 08/01/20   Kirtland Bouchard, PA-C  polyvinyl alcohol (ARTIFICIAL TEARS) 1.4 % ophthalmic solution Place 1 drop into both eyes as needed for dry eyes. 10/12/17   Salley Scarlet, MD  pravastatin (PRAVACHOL) 20 MG tablet TAKE 1 TABLET(20 MG) BY MOUTH DAILY 07/11/20   Valentino Nose, NP  sertraline (ZOLOFT) 25 MG tablet Take 1 tablet (25 mg total) by mouth daily. 08/30/20  Noemi Chapel A, NP  sucralfate (CARAFATE) 1 g tablet TAKE 1 TABLET(1 GRAM) BY MOUTH FOUR TIMES DAILY 06/11/20   Eulogio Bear, NP  tizanidine (ZANAFLEX) 2 MG capsule Take 1 capsule (2 mg total) by mouth 3 (three) times daily. 08/30/20   Eulogio Bear, NP  traMADol (ULTRAM) 50 MG tablet Take 1-2 tablets (50-100 mg total) by mouth every 6 (six) hours as needed. 06/11/20   Eulogio Bear, NP    Family History Family History  Problem Relation Age of Onset   Hypertension Mother    Colon cancer Neg Hx    Breast cancer Neg Hx     Social History Social History   Tobacco Use   Smoking status: Never   Smokeless tobacco: Never  Vaping Use   Vaping Use: Never used  Substance Use Topics   Alcohol use: No    Alcohol/week: 0.0 standard drinks   Drug use: No     Allergies   Bactrim [sulfamethoxazole-trimethoprim], Rifampin, and Vancomycin   Review of Systems Review of Systems As stated above in HPI  Physical Exam Triage Vital Signs ED Triage Vitals  Enc Vitals Group     BP 09/07/20 1936 (!) 144/56     Pulse Rate 09/07/20 1936 88     Resp 09/07/20 1936 15     Temp 09/07/20 1936 99.1 F (37.3 C)     Temp Source 09/07/20 1936 Oral     SpO2 09/07/20 1936 93 %     Weight --      Height --      Head Circumference --      Peak Flow --      Pain Score 09/07/20 1928 4     Pain  Loc --      Pain Edu? --      Excl. in [place]? --    No data found.  Updated Vital Signs BP (!) 144/56 (BP Location: Left Arm)   Pulse 88   Temp 99.1 F (37.3 C) (Oral)   Resp 15   SpO2 93%   Physical Exam Vitals and nursing note reviewed.  Constitutional:      General: She is not in acute distress.    Appearance: She is well-developed. She is not ill-appearing, toxic-appearing or diaphoretic.  Cardiovascular:     Rate and Rhythm: Normal rate and regular rhythm.     Heart sounds: Normal heart sounds.  Pulmonary:     Effort: Pulmonary effort is normal.     Breath sounds: Normal breath sounds.  Chest:     Chest wall: No mass, deformity, tenderness or crepitus.  Abdominal:     Palpations: Abdomen is soft.  Musculoskeletal:     Cervical back: Normal range of motion and neck supple.  Skin:    Coloration: Skin is not cyanotic or pale.     Findings: No ecchymosis.  Neurological:     Mental Status: She is alert.     UC Treatments / Results  Labs (all labs ordered are listed, but only abnormal results are displayed) Labs Reviewed - No data to display  EKG   Radiology No results found.  Procedures Procedures (including critical care time)  Medications Ordered in UC Medications - No data to display  Initial Impression / Assessment and Plan / UC Course  I have reviewed the triage vital signs and the nursing notes.  Pertinent labs & imaging results that were available during my care of the patient were reviewed by me and  considered in my medical decision making (see chart for details).     New.  Symptoms are concerning for potential MI versus PE versus other.  Given her history, exam and O2 saturation being low earlier than normal.  I discussed my concerns with patient and her daughter.  I am referring him over to the emergency room right away for further work-up.  Currently the wait time to receive back chest x-ray imaging is over 30 minutes and I do not feel that is in  patient's best interest to wait this timeframe as even pneumonia should not cause her to have arm pain and GERD sensation.  Patient's daughter prefers to transport her across our parking lot instead of EMS. Final Clinical Impressions(s) / UC Diagnoses   Final diagnoses:  Chest pain, unspecified type  SOB (shortness of breath)   Discharge Instructions   None    ED Prescriptions   None    PDMP not reviewed this encounter.   Hughie Closs, Hershal Coria 09/07/20 2002

## 2020-09-07 NOTE — Telephone Encounter (Signed)
Left message with pt granddaughter regarding the side effect of the zoloft pt is taking along with how pcp is asking for med too being taken for the nx 2 wks. Regarding the ear pain, pt will need ov

## 2020-09-07 NOTE — ED Triage Notes (Signed)
Patient reports increasing exertional dyspnea with fatigue /tired for 2 weeks , mild chest discomfort , no fever or chills . , denies cough or chest congestion .

## 2020-09-07 NOTE — ED Provider Notes (Signed)
[facility] EMERGENCY DEPARTMENT Provider Note   CSN: [id number] Arrival date & time: 09/07/20  2005     History Chief Complaint  Patient presents with   Shortness of Breath    Joyce Gilmore is a 86 y.o. female with a history of CHF with preserved EF, CKD, diabetes mellitus, and hypertension who presents to the emergency department with her daughter for evaluation of headache as well as shortness of breath. History is primarily provided by patient's daughter with confirmation from the patient.   Patient's headache began 3 days ago, it is the left side of her head and occurs intermittently, this feels better when she massages her left neck/upper back muscles with cream.  No other alleviating or or aggravating factors.  Denies visual disturbance, numbness, tingling, weakness, jaw pain, or neck stiffness.  Denies head trauma/injury.  Patient's shortness of breath has been a chronic intermittent problem for the past couple of years, usually if she uses her inhaler this gets better, however it has seemed worse over the past 2 weeks especially when up and moving around with associated dry cough as well as some chest discomfort.  Chest discomfort sometimes feels like she is hungry. Denies fever, hemoptysis, leg swelling, vomiting, syncope, or dizziness.  Interpretor utilized throughout encounter    HPI     Past Medical History:  Diagnosis Date   Arthritis    CHF (congestive heart failure) (HCC)    diastolic   Chronic kidney disease    hx of kidney stones,    Depression    hx of    Diabetes mellitus without complication (HCC)    Ganglion, left ankle and foot    Hearing loss of both ears    Hypertension    MRSA infection    Oct 13 - Nov 13   Osteoporosis    Tinnitus of both ears     Patient Active Problem List   Diagnosis Date Noted   Type 2 DM with CKD stage 3 and hypertension ([place]) 07/12/2020   Chronic kidney disease 05/07/2018   GERD (gastroesophageal  reflux disease) 03/10/2017   Uses hearing aid 09/12/2016   Asthmatic bronchitis 03/11/2016   Chronic diastolic heart failure ([place]) 10/12/2015   Seasonal allergies 11/21/2014   DDD (degenerative disc disease), lumbar 10/11/2014   H/O compression fracture of spine 10/11/2014   Pulmonary hypertension ([place])    Hepatic cirrhosis ([place]) 09/24/2014   SIADH (syndrome of inappropriate ADH production) ([place]) 06/13/2014   Insomnia 12/28/2013   Anemia 07/06/2013   HLD (hyperlipidemia) 06/27/2013   Osteoporosis 07/26/2012   Hyponatremia 05/16/2011   Type 2 diabetes mellitus with diabetic chronic kidney disease ([place]) 08/17/2006   Essential hypertension 08/17/2006   DIVERTICULOSIS, COLON 08/17/2006    Past Surgical History:  Procedure Laterality Date   INCISION AND DRAINAGE HIP  12/22/2011   Procedure: IRRIGATION AND DEBRIDEMENT HIP;  Surgeon: Mcarthur Rossetti, MD;  Location: [facility];  Service: Orthopedics;  Laterality: Right;  Irrigation and debridement right hip   INCISION AND DRAINAGE HIP  12/27/2011   Procedure: IRRIGATION AND DEBRIDEMENT HIP;  Surgeon: Mcarthur Rossetti, MD;  Location: [facility];  Service: Orthopedics;  Laterality: Right;  Repeat irrigation and debridement   JOINT REPLACEMENT     bilateral knee, right hip    MASS EXCISION N/A 01/03/2020   Procedure: EXCISION OF HEMANGIOMA OF THE PALATE;  Surgeon: Leta Baptist, MD;  Location: [facility];  Service: ENT;  Laterality: N/A;   TOTAL HIP  REVISION  12/05/2011   Procedure: TOTAL HIP REVISION;  Surgeon: Mcarthur Rossetti, MD;  Location: [facility];  Service: Orthopedics;  Laterality: Right;  Right Hip Revision Arthroplasty to Total Hip, Excision of Old Implant     OB History   No obstetric history on file.     Family History  Problem Relation Age of Onset   Hypertension Mother    Colon cancer Neg Hx    Breast cancer Neg Hx     Social History   Tobacco Use   Smoking status: Never   Smokeless tobacco: Never   Vaping Use   Vaping Use: Never used  Substance Use Topics   Alcohol use: No    Alcohol/week: 0.0 standard drinks   Drug use: No    Home Medications Prior to Admission medications   Medication Sig Start Date End Date Taking? Authorizing Provider  Acetaminophen (TYLENOL PO) Take 1,000 tablets by mouth every 6 (six) hours as needed (pain/headache).     [provider]  albuterol (PROVENTIL) (2.5 MG/3ML) 0.083% nebulizer solution Take 3 mLs (2.5 mg total) by nebulization every 6 (six) hours as needed for wheezing or shortness of breath. 08/30/20   Eulogio Bear, NP  albuterol (VENTOLIN HFA) 108 (90 Base) MCG/ACT inhaler Inhale 1-2 puffs into the lungs every 4 (four) hours as needed for wheezing or shortness of breath. 08/30/20   Eulogio Bear, NP  amLODipine (NORVASC) 5 MG tablet TAKE 1 TABLET(5 MG) BY MOUTH DAILY 06/11/20   Eulogio Bear, NP  aspirin (ASPIRIN 81) 81 MG EC tablet Take 1 tablet (81 mg total) by mouth daily. Swallow whole. 01/09/20   Alycia Rossetti, MD  Blood Glucose Monitoring Suppl (BLOOD GLUCOSE SYSTEM PAK) KIT Please dispense based on patient and insurance preference.  Use as directed to monitor FSBS four times daily.  Dx E11.22 07/12/20   Eulogio Bear, NP  Calcium Carbonate-Vit D-Min (CALCIUM 1200 PO) Take by mouth.    [provider]  Fluticasone Furoate (ARNUITY ELLIPTA) 100 MCG/ACT AEPB Inhale 1 puff into the lungs daily. Rinse mouth and spit after each use. 08/30/20   Eulogio Bear, NP  furosemide (LASIX) 20 MG tablet Take 1 tablet (20 mg total) by mouth daily. 06/11/20   Eulogio Bear, NP  glucose blood test strip Please dispense based on patient and insurance preference.  Use as directed to monitor FSBS four times daily.  Dx E11.22 07/12/20   Eulogio Bear, NP  insulin aspart (NOVOLOG FLEXPEN) 100 UNIT/ML FlexPen ADMINISTER 5 TO 15 UNITS UNDER THE SKIN THREE TIMES DAILY WITH MEALS PER SLIDING SCALE 08/30/20   Eulogio Bear, NP  insulin glargine (LANTUS SOLOSTAR) 100 UNIT/ML Solostar Pen INJECT  38 UNITS UNDER THE SKIN AT BEDTIME 08/30/20   Eulogio Bear, NP  Iron, Ferrous Sulfate, 325 (65 Fe) MG TABS Take 325 mg by mouth daily. TAKE IN AM WITH ORANGE JUICE. 07/31/20   Eulogio Bear, NP  Lancets MISC Please dispense based on patient and insurance preference.  Use as directed to monitor FSBS four times daily.  Dx E11.22 07/12/20   Eulogio Bear, NP  lansoprazole (PREVACID) 30 MG capsule TAKE ONE CAPSULE BY MOUTH DAILY AT 12 NOON 06/21/20   Susy Frizzle, MD  lisinopril (ZESTRIL) 10 MG tablet Take 1 tablet (10 mg total) by mouth daily. 06/11/20   Eulogio Bear, NP  loratadine (CLARITIN) 10 MG tablet TAKE 1 TABLET(10 MG) BY MOUTH DAILY  AS NEEDED FOR ALLERGIES 11/15/19   Alycia Rossetti, MD  metoprolol tartrate (LOPRESSOR) 25 MG tablet TAKE 1/2 TABLET(12.5 MG) BY MOUTH TWICE DAILY 06/11/20   Eulogio Bear, NP  MITIGARE 0.6 MG CAPS TAKE 1 CAPSULE BY MOUTH DAILY 08/01/20   Pete Pelt, PA-C  polyvinyl alcohol (ARTIFICIAL TEARS) 1.4 % ophthalmic solution Place 1 drop into both eyes as needed for dry eyes. 10/12/17   Alycia Rossetti, MD  pravastatin (PRAVACHOL) 20 MG tablet TAKE 1 TABLET(20 MG) BY MOUTH DAILY 07/11/20   Eulogio Bear, NP  sertraline (ZOLOFT) 25 MG tablet Take 1 tablet (25 mg total) by mouth daily. 08/30/20   Eulogio Bear, NP  sucralfate (CARAFATE) 1 g tablet TAKE 1 TABLET(1 GRAM) BY MOUTH FOUR TIMES DAILY 06/11/20   Eulogio Bear, NP  tizanidine (ZANAFLEX) 2 MG capsule Take 1 capsule (2 mg total) by mouth 3 (three) times daily. 08/30/20   Eulogio Bear, NP  traMADol (ULTRAM) 50 MG tablet Take 1-2 tablets (50-100 mg total) by mouth every 6 (six) hours as needed. 06/11/20   Eulogio Bear, NP    Allergies    Bactrim [sulfamethoxazole-trimethoprim], Rifampin, and Vancomycin  Review of Systems   Review of Systems  Constitutional:  Negative for  chills and fever.  Eyes:  Negative for visual disturbance.  Respiratory:  Positive for cough and shortness of breath.   Cardiovascular:  Positive for chest pain. Negative for leg swelling.  Gastrointestinal:  Positive for nausea. Negative for abdominal pain, constipation, diarrhea and vomiting.  Neurological:  Positive for headaches. Negative for dizziness, syncope, speech difficulty, weakness and numbness.   Physical Exam Updated Vital Signs BP (!) 148/53 (BP Location: Right Arm)   Pulse (!) 58   Temp 99.7 F (37.6 C) (Oral)   Resp (!) 22   SpO2 97%   Physical Exam Vitals and nursing note reviewed.  Constitutional:      General: She is not in acute distress.    Appearance: She is well-developed. She is not toxic-appearing.  HENT:     Head: Normocephalic and atraumatic.     Comments: No tenderness to palpation to the temporal artery region.    Right Ear: There is no impacted cerumen. Tympanic membrane is not erythematous, retracted or bulging.     Left Ear: There is no impacted cerumen. Tympanic membrane is not erythematous, retracted or bulging.     Nose: Nose normal.     Mouth/Throat:     Pharynx: Uvula midline.  Eyes:     General:        Right eye: No discharge.        Left eye: No discharge.     Extraocular Movements: Extraocular movements intact.     Conjunctiva/sclera: Conjunctivae normal.     Pupils: Pupils are equal, round, and reactive to light.  Cardiovascular:     Rate and Rhythm: Normal rate and regular rhythm.  Pulmonary:     Effort: Pulmonary effort is normal. No respiratory distress.     Breath sounds: Normal breath sounds. No wheezing, rhonchi or rales.  Abdominal:     General: There is no distension.     Palpations: Abdomen is soft.     Tenderness: There is no abdominal tenderness.  Musculoskeletal:     Cervical back: Neck supple. No rigidity. Muscular tenderness (Left paraspinal muscle tenderness to palpation especially over the trapezius muscle.)  present. No spinous process tenderness.     Right lower leg: No  tenderness. No edema.     Left lower leg: No tenderness. No edema.  Skin:    General: Skin is warm and dry.     Findings: No rash.  Neurological:     Mental Status: She is alert.     Comments: Clear speech.  CN III through XII grossly intact.  Sensation grossly intact bilateral upper and lower extremities.  5 out of 5 symmetric grip strength and strength with plantar dorsiflexion bilaterally.  Intact finger-to-nose.  Negative pronator drift.  Patient is ambulatory  Psychiatric:        Behavior: Behavior normal.    ED Results / Procedures / Treatments   Labs (all labs ordered are listed, but only abnormal results are displayed) Labs Reviewed  COMPREHENSIVE METABOLIC PANEL - Abnormal; Notable for the following components:      Result Value   Sodium 124 (*)    Chloride 90 (*)    Glucose, Bld 187 (*)    BUN 25 (*)    All other components within normal limits  CBC WITH DIFFERENTIAL/PLATELET - Abnormal; Notable for the following components:   Hemoglobin 11.6 (*)    HCT 35.0 (*)    MCV 79.5 (*)    RDW 20.5 (*)    All other components within normal limits  BRAIN NATRIURETIC PEPTIDE  TROPONIN I (HIGH SENSITIVITY)  TROPONIN I (HIGH SENSITIVITY)    EKG None  Radiology DG Chest 2 View  Result Date: 09/07/2020 CLINICAL DATA:  Shortness of breath with chest pain EXAM: CHEST - 2 VIEW COMPARISON:  12/19/2019 FINDINGS: Low lung volume. Small foci of atelectasis at the CP angles. No pleural effusion or pneumothorax. Borderline cardiomegaly likely augmented by low lung volume. Chronic compression deformity of the lower thoracic spine. IMPRESSION: No active cardiopulmonary disease. Low lung volumes with borderline cardiomegaly and atelectasis at the bases Electronically Signed   By: Donavan Foil M.D.   On: 09/07/2020 21:07   CT Head Wo Contrast  Result Date: 09/08/2020 CLINICAL DATA:  Headache EXAM: CT HEAD WITHOUT CONTRAST  TECHNIQUE: Contiguous axial images were obtained from the base of the skull through the vertex without intravenous contrast. COMPARISON:  None. FINDINGS: Brain: Normal anatomic configuration. No abnormal intra or extra-axial mass lesion or fluid collection. No abnormal mass effect or midline shift. No evidence of acute intracranial hemorrhage or infarct. Ventricular size is normal. Cerebellum unremarkable. Vascular: Unremarkable Skull: Intact Sinuses/Orbits: Large mucous retention cyst noted within the inferior right maxillary sinus. Remaining paranasal sinuses are clear. Orbits are unremarkable. Other: Mastoid air cells and middle ear cavities are clear. IMPRESSION: No acute intracranial abnormality. Mild right maxillary sinus disease. Electronically Signed   By: Fidela Salisbury MD   On: 09/08/2020 03:14   CT Angio Chest PE W/Cm &/Or Wo Cm  Result Date: 09/08/2020 CLINICAL DATA:  Chest pain, dyspnea on exertion EXAM: CT ANGIOGRAPHY CHEST WITH CONTRAST TECHNIQUE: Multidetector CT imaging of the chest was performed using the standard protocol during bolus administration of intravenous contrast. Multiplanar CT image reconstructions and MIPs were obtained to evaluate the vascular anatomy. CONTRAST:  140mL OMNIPAQUE IOHEXOL 350 MG/ML SOLN COMPARISON:  01/03/2012 FINDINGS: Cardiovascular: There is adequate opacification of the pulmonary arterial tree. No intraluminal filling defect is identified to suggest acute pulmonary embolism. The central pulmonary arteries are of normal caliber. Minimal coronary artery calcification. Global cardiac size within normal limits. No pericardial effusion. The thoracic aorta is unremarkable. Mediastinum/Nodes: Visualized thyroid is unremarkable. No pathologic thoracic adenopathy. Small hiatal hernia. Esophagus is unremarkable.  Lungs/Pleura: Mosaic attenuation of the pulmonary parenchyma suggests multifocal air trapping secondary to small airways disease. No superimposed focal pulmonary  nodule or infiltrate identified. No pneumothorax or pleural effusion. The examination is performed in the expiratory phase of respiration and there is marked narrowing of the distal trachea and left lower lobar pulmonary bronchus in the AP dimension suggesting changes of tracheobronchomalacia. Upper Abdomen: No acute abnormality. Musculoskeletal: There is interval development of a remote appearing severe compression fracture of T8 with near vertebral plana deformity and resultant mild focal kyphosis of the thoracic spine. Progressive degenerative changes are seen within the lower thoracic spine. No acute bone abnormality. Review of the MIP images confirms the above findings. IMPRESSION: No acute pulmonary embolization. Mosaic attenuation of the pulmonary parenchyma suggesting multifocal air trapping secondary to small airways disease. Marked narrowing of the distal trachea and left lower lobar pulmonary bronchus suggesting changes of a tracheobronchomalacia. Remote appearing severe compression fracture of T8 with resultant vertebral plana deformity Mild coronary artery calcification Electronically Signed   By: Fidela Salisbury MD   On: 09/08/2020 03:25    Procedures Procedures   Medications Ordered in ED Medications - No data to display  ED Course  I have reviewed the triage vital signs and the nursing notes.  Pertinent labs & imaging results that were available during my care of the patient were reviewed by me and considered in my medical decision making (see chart for details).    MDM Rules/Calculators/A&P                          Patient presents to the ED with complaints of complaints of shortness of breath and headache.  Nontoxic, vitals without significant abnormality, blood pressure intermittently elevated.   Additional history obtained:  Additional history obtained from chart review & nursing note review.   EKG no STEMI Lab Tests:  I Ordered, reviewed, and interpreted labs, which  included:  CBC: Anemia similar to prior CMP: Worsening hyponatremia mild hyperglycemia. Troponins: Flat without significant elevation BNP: Within normal limits  Imaging Studies ordered:  I ordered imaging studies which included CT of the head and CT angio of the chest for further assessment, I independently reviewed, formal radiology impression shows:  CT head: No acute intracranial abnormality. Mild right maxillary sinus disease.  CT angio of the chest: No acute pulmonary embolization. Mosaic attenuation of the pulmonary parenchyma suggesting multifocal air trapping secondary to small airways disease. Marked narrowing of the distal trachea and left lower lobar pulmonary bronchus suggesting changes of a tracheobronchomalacia. Remote appearing severe compression fracture of T8 with resultant vertebral plana deformity Mild coronary artery calcification  ED Course:  Headache: Patient is afebrile without nuchal rigidity, do not suspect acute meningitis.  She has no temporal artery tenderness to palpation, no visual disturbance, no jaw pain, does not seem clinically consistent with temporal arteritis.  No focal neurologic deficits to suggest acute ischemic CVA. No inciting event to suggest vertebral artery dissection. CT head without bleed. Pain significantly improved with Lidoderm application to the left trapezius muscle, suspect this may be the cause of some of her discomfort- will discharge home with these.  Shortness of breath/chest discomfort: Troponins are flat, EKG without ischemic changes, feel that ACS is somewhat less likely, however given patient's risk factors feel she would benefit from cardiology follow-up.  CT angios negative for PE, CT also does not show findings of fluid overload and BNP is within normal limits, does not seem  like acute CHF.  CT does not show findings of acute pneumonia.  There are some chronic lung findings and additional findings as above.  Patient ambulated in the  emergency department maintaining SPO2 greater than 92% on room air, did start having some wheezing with this, given DuoNeb with improvement.  Will give a short course of steroids with encouragement for continued use of her inhaler at home.  Hyponatremia: Found to be hyponatremic in the emergency department at 124, downtrending from a week ago.  Patient does have a history of SIADH, however she has had normal sodium levels for the past couple of years on chart review.  We will have her fluid restrict to 800 mL of fluid per day, include salt in her diet, and hold her Lasix over the weekend with very close primary care follow-up.  I discussed results, treatment plan, need for follow-up, and return precautions with the patient and her daughter at bedside utilizing interpreter.  I discussed that we would use Google translate to provide instructions in Spanish as well but that this may be limited/flawed given automated translation.  Provided opportunity for questions, patient & daughter confirmed understanding and are in agreement with plan.    Findings and plan of care discussed with supervising physician Dr. Betsey Holiday who has provided guidance in evaluation/management/disposition, in agreement with care     Portions of this note were generated with Dragon dictation software. Dictation errors may occur despite best attempts at proofreading.  Final Clinical Impression(s) / ED Diagnoses Final diagnoses:  Dyspnea, unspecified type  Hyponatremia    Rx / DC Orders ED Discharge Orders          Ordered    predniSONE (DELTASONE) 10 MG tablet  Daily        09/08/20 0547    lidocaine (LIDODERM) 5 %  Daily PRN        09/08/20 0547             Amaryllis Dyke, PA-C 09/08/20 3524    Orpah Greek, MD 09/09/20 [phone]

## 2020-09-07 NOTE — ED Provider Notes (Signed)
Emergency Medicine Provider Triage Evaluation Note  Joyce Gilmore , a 86 y.o. female  was evaluated in triage.  Pt complains of shortness of breath and chest pain.  Patient has been having symptoms over the last 2 weeks.  The symptoms have gotten worse the last few days.  Patient endorses shortness of breath that is worse with exertion.  Chest pain is midsternal.  Review of Systems  Positive: Shortness of breath, chest pain Negative: Fever, chills  Physical Exam  BP (!) 153/62 (BP Location: Left Arm)   Pulse 64   Temp 98.2 F (36.8 C) (Oral)   Resp (!) 21   SpO2 97%  Gen:   Awake, no distress   Resp:   tachypnea rate of 20, aspiratory wheezing noted bilateral upper lung fields, rales to bilateral lower lung fields MSK:   Moves extremities without difficulty  Other:  No swelling, edema, or tenderness to bilateral lower extremities.  Medical Decision Making  Medically screening exam initiated at 8:32 PM.  Appropriate orders placed.  SAMREET EDENFIELD was informed that the remainder of the evaluation will be completed by another provider, this initial triage assessment does not replace that evaluation, and the importance of remaining in the ED until their evaluation is complete.  The patient appears stable so that the remainder of the work up may be completed by another provider.      Dyann Ruddle 09/07/20 2034    Drenda Freeze, MD 09/07/20 [phone]

## 2020-09-07 NOTE — ED Triage Notes (Signed)
Pt presents with fatigue x 3 days.   States she has been feeling very hungry and headache x 3 days. C/o mid chest pain.   Pt daughter states the pt has been taking new blood pressure medications. C/o bilateral arm pain and states she has had difficulty breathing. States she finds her self catching her breath more often.   States she has felt nauseas.  Denies arm numbness and v/d.

## 2020-09-08 ENCOUNTER — Emergency Department (HOSPITAL_COMMUNITY): Payer: Medicaid Other

## 2020-09-08 DIAGNOSIS — R519 Headache, unspecified: Secondary | ICD-10-CM | POA: Diagnosis not present

## 2020-09-08 DIAGNOSIS — R0609 Other forms of dyspnea: Secondary | ICD-10-CM | POA: Diagnosis not present

## 2020-09-08 DIAGNOSIS — R079 Chest pain, unspecified: Secondary | ICD-10-CM | POA: Diagnosis not present

## 2020-09-08 DIAGNOSIS — K449 Diaphragmatic hernia without obstruction or gangrene: Secondary | ICD-10-CM | POA: Diagnosis not present

## 2020-09-08 DIAGNOSIS — R0600 Dyspnea, unspecified: Secondary | ICD-10-CM | POA: Diagnosis not present

## 2020-09-08 LAB — SARS CORONAVIRUS 2 (TAT 6-24 HRS): SARS Coronavirus 2: NEGATIVE

## 2020-09-08 MED ORDER — IOHEXOL 350 MG/ML SOLN
100.0000 mL | Freq: Once | INTRAVENOUS | Status: AC | PRN
  Administered 2020-09-08: 100 mL via INTRAVENOUS

## 2020-09-08 MED ORDER — PREDNISONE 20 MG PO TABS
40.0000 mg | ORAL_TABLET | Freq: Once | ORAL | Status: AC
  Administered 2020-09-08: 40 mg via ORAL
  Filled 2020-09-08: qty 2

## 2020-09-08 MED ORDER — PREDNISONE 10 MG PO TABS: 40.0000 mg | ORAL_TABLET | Freq: Every day | ORAL | 0 refills | Status: AC

## 2020-09-08 MED ORDER — IPRATROPIUM-ALBUTEROL 0.5-2.5 (3) MG/3ML IN SOLN
3.0000 mL | Freq: Once | RESPIRATORY_TRACT | Status: AC
  Administered 2020-09-08: 3 mL via RESPIRATORY_TRACT
  Filled 2020-09-08: qty 3

## 2020-09-08 MED ORDER — LIDOCAINE 5 % EX PTCH: 1.0000 | MEDICATED_PATCH | Freq: Every day | CUTANEOUS | 0 refills | Status: DC | PRN

## 2020-09-08 NOTE — ED Notes (Signed)
PT to CT.

## 2020-09-08 NOTE — ED Notes (Signed)
Pt ambulated in room on RA to check oxygen saturation. O2 sat remained 95-96% with expiratory wheezing.

## 2020-09-08 NOTE — Discharge Instructions (Addendum)
You were seen in the emergency department today for a headache as well as trouble breathing.  Your blood work did show that your sodium was low at 124.  Given your low sodium we would like you to not take your Lasix/furosemide over the weekend until you are able to follow-up with your primary care provider.  We would also like you to limit your fluid intake to 800 mL of fluid per day and add salt to your foods.   We would like you to have your sodium rechecked first thing Monday 07/18 as well as for your symptoms to be rechecked.  Your head CT did not show any brain bleed.  Your CT scan of your chest showed some chronic lung changes.   Your lungs did have some wheezing therefore we are sending you home with steroids, you were given your first dose in the emergency department, start taking this tomorrow.  Please be aware that the steroids may make your blood sugar go up and to monitor this closely at home.  Please use your inhaler at home.  We would like you to follow-up with your primary care provider in regards to your chest discomfort and shortness of breath as well as cardiology, we have given you information for a cardiologist.   Please follow-up on Monday morning July 18th as discussed- this is very important  We are also sending you home with a Lidoderm patch prescription, please apply 1 patch to area of most difficult pain on your neck once per day and remove and discard patch within 12 hours.  This is to try to help with your headaches.  Return to the emergency department for any new or worsening symptoms including but not limited to new or worsening pain, increased trouble breathing, passing out, fever, coughing up blood, numbness, weakness, change in your vision, or any other concerns.  We have prescribed you new medication(s) today. Discuss the medications prescribed today with your pharmacist as they can have adverse effects and interactions with your other medicines including over the  counter and prescribed medications. Seek medical evaluation if you start to experience new or abnormal symptoms after taking one of these medicines, seek care immediately if you start to experience difficulty breathing, feeling of your throat closing, facial swelling, or rash as these could be indications of a more serious allergic reaction  Google Translate:  Lo vieron en el departamento de emergencias hoy por un dolor de cabeza y dificultad para respirar. Su anlisis de [employer] su nivel de sodio era bajo en 124. Dado su nivel bajo de sodio, nos gustara que no tome su Lasix/furosemida durante el fin de semana hasta que pueda hacer un seguimiento con su proveedor de Midwife. Tambin nos gustara que limite su ingesta de lquidos a 800 ml de lquido por da y agregue sal a sus alimentos.  Nos gustara que se vuelva a controlar el sodio a primera hora del lunes 18 de julio, as como que se vuelvan a Emergency planning/management officer.  La tomografa computarizada de su cabeza no mostr ninguna hemorragia cerebral. Su tomografa computarizada de su pecho mostr algunos cambios pulmonares crnicos.  Sus pulmones tenan sibilancias, por lo tanto, lo enviaremos a casa con esteroides, le dieron su primera dosis en el departamento de emergencias, comience a tomarla maana. Tenga en cuenta que los esteroides pueden hacer que su nivel de azcar en la sangre suba y controle esto de cerca en casa. Utilice su inhalador en casa.  Nos  gustara que hiciera un seguimiento con su proveedor de atencin primaria con respecto a su malestar en el pecho y dificultad para Ambulance person, as como a cardiologa, le hemos dado informacin para un cardilogo.   Haga un seguimiento el lunes 18 de julio por la maana como se discuti; esto es [employer].  Tambin lo enviaremos a casa con una receta de parche de Lidoderm, aplique 1 parche en el rea de dolor ms difcil en su cuello una vez al da y retire y [place] 12 horas. Esto es para tratar de ayudarlo con sus dolores de [place].  Regrese al departamento de emergencias por cualquier sntoma nuevo o que empeore, incluidos, entre otros, dolor nuevo o que [place], mayor dificultad para Ambulance person, [place], [place], tos con [place], entumecimiento, debilidad, cambio en su visin o cualquier otra inquietud.  Le hemos recetado nuevos medicamentos hoy. Hable sobre los medicamentos recetados hoy con su farmacutico, ya que pueden tener efectos adversos e interacciones con sus otros medicamentos, incluidos los medicamentos recetados y de [place]. Busque una evaluacin mdica si comienza a experimentar sntomas nuevos o anormales despus de tomar uno de estos medicamentos, busque atencin mdica de inmediato si comienza a experimentar dificultad para respirar, sensacin de que se le cierra la garganta, hinchazn facial o sarpullido, ya que estos podran ser indicios de una enfermedad ms grave. reaccin alrgica

## 2020-09-14 ENCOUNTER — Telehealth: Payer: Self-pay | Admitting: *Deleted

## 2020-09-14 ENCOUNTER — Ambulatory Visit: Payer: Medicaid Other | Admitting: Family Medicine

## 2020-09-14 ENCOUNTER — Other Ambulatory Visit: Payer: Self-pay

## 2020-09-14 VITALS — BP 110/70 | HR 56 | Temp 98.1°F | Resp 16 | Ht <= 58 in | Wt 157.0 lb

## 2020-09-14 DIAGNOSIS — E871 Hypo-osmolality and hyponatremia: Secondary | ICD-10-CM

## 2020-09-14 DIAGNOSIS — J4531 Mild persistent asthma with (acute) exacerbation: Secondary | ICD-10-CM | POA: Diagnosis not present

## 2020-09-14 LAB — CBC WITH DIFFERENTIAL/PLATELET
Absolute Monocytes: 805 cells/uL (ref 200–950)
Basophils Absolute: 33 cells/uL (ref 0–200)
Basophils Relative: 0.4 %
Eosinophils Absolute: 291 cells/uL (ref 15–500)
Eosinophils Relative: 3.5 %
HCT: 36.5 % (ref 35.0–45.0)
Hemoglobin: 11.6 g/dL — ABNORMAL LOW (ref 11.7–15.5)
Lymphs Abs: 2432 cells/uL (ref 850–3900)
MCH: 26.1 pg — ABNORMAL LOW (ref 27.0–33.0)
MCHC: 31.8 g/dL — ABNORMAL LOW (ref 32.0–36.0)
MCV: 82.2 fL (ref 80.0–100.0)
MPV: 9.8 fL (ref 7.5–12.5)
Monocytes Relative: 9.7 %
Neutro Abs: 4739 cells/uL (ref 1500–7800)
Neutrophils Relative %: 57.1 %
Platelets: 238 10*3/uL (ref 140–400)
RBC: 4.44 10*6/uL (ref 3.80–5.10)
RDW: 20.2 % — ABNORMAL HIGH (ref 11.0–15.0)
Total Lymphocyte: 29.3 %
WBC: 8.3 10*3/uL (ref 3.8–10.8)

## 2020-09-14 LAB — COMPLETE METABOLIC PANEL WITH GFR
AG Ratio: 1.8 (calc) (ref 1.0–2.5)
ALT: 20 U/L (ref 6–29)
AST: 20 U/L (ref 10–35)
Albumin: 4.2 g/dL (ref 3.6–5.1)
Alkaline phosphatase (APISO): 80 U/L (ref 37–153)
BUN/Creatinine Ratio: 20 (calc) (ref 6–22)
BUN: 19 mg/dL (ref 7–25)
CO2: 25 mmol/L (ref 20–32)
Calcium: 9 mg/dL (ref 8.6–10.4)
Chloride: 99 mmol/L (ref 98–110)
Creat: 0.97 mg/dL — ABNORMAL HIGH (ref 0.60–0.95)
Globulin: 2.3 g/dL (calc) (ref 1.9–3.7)
Glucose, Bld: 44 mg/dL — ABNORMAL LOW (ref 65–99)
Potassium: 4.1 mmol/L (ref 3.5–5.3)
Sodium: 133 mmol/L — ABNORMAL LOW (ref 135–146)
Total Bilirubin: 0.3 mg/dL (ref 0.2–1.2)
Total Protein: 6.5 g/dL (ref 6.1–8.1)
eGFR: 58 mL/min/{1.73_m2} — ABNORMAL LOW (ref >=60)

## 2020-09-14 MED ORDER — FLUTICASONE FUROATE-VILANTEROL 200-25 MCG/INH IN AEPB: 1.0000 | INHALATION_SPRAY | Freq: Every day | RESPIRATORY_TRACT | 11 refills | Status: DC

## 2020-09-14 MED ORDER — BUDESONIDE-FORMOTEROL FUMARATE 160-4.5 MCG/ACT IN AERO: 2.0000 | INHALATION_SPRAY | Freq: Two times a day (BID) | RESPIRATORY_TRACT | 3 refills | Status: DC

## 2020-09-14 NOTE — Progress Notes (Signed)
Subjective:    Patient ID: Joyce Gilmore, female    DOB: 10/26/1934, 86 y.o.   MRN: [id number]  HPI Patient was recently seen at an urgent care in a local emergency room for shortness of breath and some chest discomfort.  CT scan showed signs of possible airway obstruction and tracheomalacia.    IMPRESSION: No acute pulmonary embolization.   Mosaic attenuation of the pulmonary parenchyma suggesting multifocal air trapping secondary to small airways disease.   Marked narrowing of the distal trachea and left lower lobar pulmonary bronchus suggesting changes of a tracheobronchomalacia.   Remote appearing severe compression fracture of T8 with resultant vertebral plana deformity   Mild coronary artery calcification   They treated the patient for possible asthma exacerbation by giving an albuterol nebulizer treatment and then discharging her home on prednisone.  She states that her breathing improved.  Lab work in the hospital was significant for sodium level in the 120's.  She denies any seizure activity or headaches.  She states that her breathing is back to her baseline.  She is on anurity daily for asthma prevention.  She is still using albuterol once a day although history is difficult to ascertain due to possible age-related cognitive decline as well as language barriers despite using a Optometrist. Past Medical History:  Diagnosis Date  . Arthritis   . CHF (congestive heart failure) (HCC)    diastolic  . Chronic kidney disease    hx of kidney stones,   . Depression    hx of   . Diabetes mellitus without complication ([place])   . Ganglion, left ankle and foot   . Hearing loss of both ears   . Hypertension   . MRSA infection    Oct 13 - Nov 13  . Osteoporosis   . Tinnitus of both ears    Past Surgical History:  Procedure Laterality Date  . INCISION AND DRAINAGE HIP  12/22/2011   Procedure: IRRIGATION AND DEBRIDEMENT HIP;  Surgeon: Mcarthur Rossetti, MD;  Location: [facility];  Service: Orthopedics;  Laterality: Right;  Irrigation and debridement right hip  . INCISION AND DRAINAGE HIP  12/27/2011   Procedure: IRRIGATION AND DEBRIDEMENT HIP;  Surgeon: Mcarthur Rossetti, MD;  Location: [facility];  Service: Orthopedics;  Laterality: Right;  Repeat irrigation and debridement  . JOINT REPLACEMENT     bilateral knee, right hip   . MASS EXCISION N/A 01/03/2020   Procedure: EXCISION OF HEMANGIOMA OF THE PALATE;  Surgeon: Leta Baptist, MD;  Location: [facility];  Service: ENT;  Laterality: N/A;  . TOTAL HIP REVISION  12/05/2011   Procedure: TOTAL HIP REVISION;  Surgeon: Mcarthur Rossetti, MD;  Location: [facility];  Service: Orthopedics;  Laterality: Right;  Right Hip Revision Arthroplasty to Total Hip, Excision of Old Implant   Current Outpatient Medications on File Prior to Visit  Medication Sig Dispense Refill  . Acetaminophen (TYLENOL PO) Take 1,000 tablets by mouth every 6 (six) hours as needed (pain/headache).     Marland Kitchen albuterol (PROVENTIL) (2.5 MG/3ML) 0.083% nebulizer solution Take 3 mLs (2.5 mg total) by nebulization every 6 (six) hours as needed for wheezing or shortness of breath. 150 mL 1  . albuterol (VENTOLIN HFA) 108 (90 Base) MCG/ACT inhaler Inhale 1-2 puffs into the lungs every 4 (four) hours as needed for wheezing or shortness of breath. 18 g 3  . amLODipine (NORVASC) 5 MG tablet TAKE 1 TABLET(5 MG) BY MOUTH DAILY 90 tablet 1  .  aspirin (ASPIRIN 81) 81 MG EC tablet Take 1 tablet (81 mg total) by mouth daily. Swallow whole. 30 tablet 12  . Blood Glucose Monitoring Suppl (BLOOD GLUCOSE SYSTEM PAK) KIT Please dispense based on patient and insurance preference.  Use as directed to monitor FSBS four times daily.  Dx E11.22 1 kit 1  . Calcium Carbonate-Vit D-Min (CALCIUM 1200 PO) Take by mouth.    . Fluticasone Furoate (ARNUITY ELLIPTA) 100 MCG/ACT AEPB Inhale 1 puff into the lungs daily. Rinse mouth and spit after each use. 90 each 3  . furosemide  (LASIX) 20 MG tablet Take 1 tablet (20 mg total) by mouth daily. 90 tablet 3  . glucose blood test strip Please dispense based on patient and insurance preference.  Use as directed to monitor FSBS four times daily.  Dx E11.22 100 each 11  . insulin aspart (NOVOLOG FLEXPEN) 100 UNIT/ML FlexPen ADMINISTER 5 TO 15 UNITS UNDER THE SKIN THREE TIMES DAILY WITH MEALS PER SLIDING SCALE 15 mL 1  . insulin glargine (LANTUS SOLOSTAR) 100 UNIT/ML Solostar Pen INJECT  38 UNITS UNDER THE SKIN AT BEDTIME 15 mL 5  . Iron, Ferrous Sulfate, 325 (65 Fe) MG TABS Take 325 mg by mouth daily. TAKE IN AM WITH ORANGE JUICE. 30 tablet 11  . Lancets MISC Please dispense based on patient and insurance preference.  Use as directed to monitor FSBS four times daily.  Dx E11.22 100 each 99  . lansoprazole (PREVACID) 30 MG capsule TAKE ONE CAPSULE BY MOUTH DAILY AT 12 NOON 90 capsule 1  . lidocaine (LIDODERM) 5 % Place 1 patch onto the skin daily as needed. Apply patch to area most significant pain once per day.  Remove and discard patch within 12 hours of application. 30 patch 0  . lisinopril (ZESTRIL) 10 MG tablet Take 1 tablet (10 mg total) by mouth daily. 90 tablet 3  . loratadine (CLARITIN) 10 MG tablet TAKE 1 TABLET(10 MG) BY MOUTH DAILY AS NEEDED FOR ALLERGIES 30 tablet 2  . metoprolol tartrate (LOPRESSOR) 25 MG tablet TAKE 1/2 TABLET(12.5 MG) BY MOUTH TWICE DAILY 45 tablet 3  . MITIGARE 0.6 MG CAPS TAKE 1 CAPSULE BY MOUTH DAILY 5 capsule 0  . polyvinyl alcohol (ARTIFICIAL TEARS) 1.4 % ophthalmic solution Place 1 drop into both eyes as needed for dry eyes. 15 mL 3  . pravastatin (PRAVACHOL) 20 MG tablet TAKE 1 TABLET(20 MG) BY MOUTH DAILY 90 tablet 1  . sertraline (ZOLOFT) 25 MG tablet Take 1 tablet (25 mg total) by mouth daily. 30 tablet 1  . sucralfate (CARAFATE) 1 g tablet TAKE 1 TABLET(1 GRAM) BY MOUTH FOUR TIMES DAILY 360 tablet 1  . tizanidine (ZANAFLEX) 2 MG capsule Take 1 capsule (2 mg total) by mouth 3 (three) times  daily. 30 capsule 1  . traMADol (ULTRAM) 50 MG tablet Take 1-2 tablets (50-100 mg total) by mouth every 6 (six) hours as needed. 40 tablet 0   No current facility-administered medications on file prior to visit.   Allergies  Allergen Reactions  . Bactrim [Sulfamethoxazole-Trimethoprim] Other (See Comments)    Hyperkalemia (elevated potassium)  . Rifampin Other (See Comments)    Severe thrombocytopenia (low blood platelet count)  . Vancomycin Other (See Comments)    Severe thrombocytopenia (low blood platelet count)   Social History   Socioeconomic History  . Marital status: Widowed    Spouse name: Not on file  . Number of children: 7  . Years of education: Not on file  .  Highest education level: Not on file  Occupational History  . Not on file  Tobacco Use  . Smoking status: Never  . Smokeless tobacco: Never  Vaping Use  . Vaping Use: Never used  Substance and Sexual Activity  . Alcohol use: No    Alcohol/week: 0.0 standard drinks  . Drug use: No  . Sexual activity: Not Currently  Other Topics Concern  . Not on file  Social History Narrative   ** Merged History Encounter **       Social Determinants of Health   Financial Resource Strain: Not on file  Food Insecurity: Not on file  Transportation Needs: Not on file  Physical Activity: Not on file  Stress: Not on file  Social Connections: Not on file  Intimate Partner Violence: Not on file     Review of Systems     Objective:   Physical Exam Vitals reviewed.  Cardiovascular:     Rate and Rhythm: Normal rate and regular rhythm.     Heart sounds: Normal heart sounds. No murmur heard. Pulmonary:     Effort: Pulmonary effort is normal. No respiratory distress.     Breath sounds: Wheezing present. No rales.  Abdominal:     General: Bowel sounds are normal. There is no distension.     Palpations: Abdomen is soft. There is no mass.     Tenderness: There is no abdominal tenderness. There is no guarding or  rebound.          Assessment & Plan:  Hyponatremia - Plan: CBC with Differential/Platelet, COMPLETE METABOLIC PANEL WITH GFR  Mild persistent asthma with acute exacerbation Repeat lab work to assess her sodium level.  Recommended discontinuation of anurity and replace with Breo 1 inhalation daily to see if we can manage her asthma better to reduce her exacerbations and wheezing.  Follow-up as planned with her PCP

## 2020-09-14 NOTE — Telephone Encounter (Signed)
Received request from pharmacy for PA on Lydia.  PA submitted.   Received the following message from plan:  This request cannot be processed due to the medication is not covered by the plan.  Please advise.

## 2020-09-14 NOTE — Telephone Encounter (Signed)
Prescription sent to pharmacy.

## 2020-10-01 ENCOUNTER — Ambulatory Visit: Payer: Medicaid Other | Admitting: Nurse Practitioner

## 2020-10-01 NOTE — Progress Notes (Deleted)
Subjective:    Patient ID: Joyce Gilmore, female    DOB: 29-Oct-1934, 86 y.o.   MRN: [id number]  HPI: Joyce Gilmore is a 86 y.o. female presenting for  No chief complaint on file.   Allergies  Allergen Reactions   Bactrim [Sulfamethoxazole-Trimethoprim] Other (See Comments)    Hyperkalemia (elevated potassium)   Rifampin Other (See Comments)    Severe thrombocytopenia (low blood platelet count)   Vancomycin Other (See Comments)    Severe thrombocytopenia (low blood platelet count)    Outpatient Encounter Medications as of 10/01/2020  Medication Sig   Acetaminophen (TYLENOL PO) Take 1,000 tablets by mouth every 6 (six) hours as needed (pain/headache).    albuterol (PROVENTIL) (2.5 MG/3ML) 0.083% nebulizer solution Take 3 mLs (2.5 mg total) by nebulization every 6 (six) hours as needed for wheezing or shortness of breath.   albuterol (VENTOLIN HFA) 108 (90 Base) MCG/ACT inhaler Inhale 1-2 puffs into the lungs every 4 (four) hours as needed for wheezing or shortness of breath.   amLODipine (NORVASC) 5 MG tablet TAKE 1 TABLET(5 MG) BY MOUTH DAILY   aspirin (ASPIRIN 81) 81 MG EC tablet Take 1 tablet (81 mg total) by mouth daily. Swallow whole.   Blood Glucose Monitoring Suppl (BLOOD GLUCOSE SYSTEM PAK) KIT Please dispense based on patient and insurance preference.  Use as directed to monitor FSBS four times daily.  Dx E11.22   budesonide-formoterol (SYMBICORT) 160-4.5 MCG/ACT inhaler Inhale 2 puffs into the lungs 2 (two) times daily.   Calcium Carbonate-Vit D-Min (CALCIUM 1200 PO) Take by mouth.   Fluticasone Furoate (ARNUITY ELLIPTA) 100 MCG/ACT AEPB Inhale 1 puff into the lungs daily. Rinse mouth and spit after each use.   furosemide (LASIX) 20 MG tablet Take 1 tablet (20 mg total) by mouth daily.   glucose blood test strip Please dispense based on patient and insurance preference.  Use as directed to monitor FSBS four times daily.  Dx E11.22   insulin aspart (NOVOLOG FLEXPEN) 100  UNIT/ML FlexPen ADMINISTER 5 TO 15 UNITS UNDER THE SKIN THREE TIMES DAILY WITH MEALS PER SLIDING SCALE   insulin glargine (LANTUS SOLOSTAR) 100 UNIT/ML Solostar Pen INJECT  38 UNITS UNDER THE SKIN AT BEDTIME   Iron, Ferrous Sulfate, 325 (65 Fe) MG TABS Take 325 mg by mouth daily. TAKE IN AM WITH ORANGE JUICE.   Lancets MISC Please dispense based on patient and insurance preference.  Use as directed to monitor FSBS four times daily.  Dx E11.22   lansoprazole (PREVACID) 30 MG capsule TAKE ONE CAPSULE BY MOUTH DAILY AT 12 NOON   lidocaine (LIDODERM) 5 % Place 1 patch onto the skin daily as needed. Apply patch to area most significant pain once per day.  Remove and discard patch within 12 hours of application.   lisinopril (ZESTRIL) 10 MG tablet Take 1 tablet (10 mg total) by mouth daily.   loratadine (CLARITIN) 10 MG tablet TAKE 1 TABLET(10 MG) BY MOUTH DAILY AS NEEDED FOR ALLERGIES   metoprolol tartrate (LOPRESSOR) 25 MG tablet TAKE 1/2 TABLET(12.5 MG) BY MOUTH TWICE DAILY   MITIGARE 0.6 MG CAPS TAKE 1 CAPSULE BY MOUTH DAILY   polyvinyl alcohol (ARTIFICIAL TEARS) 1.4 % ophthalmic solution Place 1 drop into both eyes as needed for dry eyes.   pravastatin (PRAVACHOL) 20 MG tablet TAKE 1 TABLET(20 MG) BY MOUTH DAILY   sertraline (ZOLOFT) 25 MG tablet Take 1 tablet (25 mg total) by mouth daily.   sucralfate (CARAFATE) 1 g tablet  TAKE 1 TABLET(1 GRAM) BY MOUTH FOUR TIMES DAILY   tizanidine (ZANAFLEX) 2 MG capsule Take 1 capsule (2 mg total) by mouth 3 (three) times daily.   traMADol (ULTRAM) 50 MG tablet Take 1-2 tablets (50-100 mg total) by mouth every 6 (six) hours as needed.   No facility-administered encounter medications on file as of 10/01/2020.    Patient Active Problem List   Diagnosis Date Noted   Type 2 DM with CKD stage 3 and hypertension ([place]) 07/12/2020   Chronic kidney disease 05/07/2018   GERD (gastroesophageal reflux disease) 03/10/2017   Uses hearing aid 09/12/2016   Asthmatic  bronchitis 03/11/2016   Chronic diastolic heart failure ([place]) 10/12/2015   Seasonal allergies 11/21/2014   DDD (degenerative disc disease), lumbar 10/11/2014   H/O compression fracture of spine 10/11/2014   Pulmonary hypertension ([place])    Hepatic cirrhosis ([place]) 09/24/2014   SIADH (syndrome of inappropriate ADH production) ([place]) 06/13/2014   Insomnia 12/28/2013   Anemia 07/06/2013   HLD (hyperlipidemia) 06/27/2013   Osteoporosis 07/26/2012   Hyponatremia 05/16/2011   Type 2 diabetes mellitus with diabetic chronic kidney disease ([place]) 08/17/2006   Essential hypertension 08/17/2006   DIVERTICULOSIS, COLON 08/17/2006    Past Medical History:  Diagnosis Date   Arthritis    CHF (congestive heart failure) (HCC)    diastolic   Chronic kidney disease    hx of kidney stones,    Depression    hx of    Diabetes mellitus without complication (HCC)    Ganglion, left ankle and foot    Hearing loss of both ears    Hypertension    MRSA infection    Oct 13 - Nov 13   Osteoporosis    Tinnitus of both ears     Relevant past medical, surgical, family and social history reviewed and updated as indicated. Interim medical history since our last visit reviewed.  Review of Systems Per HPI unless specifically indicated above     Objective:    There were no vitals taken for this visit.  Wt Readings from Last 3 Encounters:  09/14/20 157 lb (71.2 kg)  08/30/20 157 lb (71.2 kg)  07/11/20 154 lb (69.9 kg)    Physical Exam  Results for orders placed or performed in visit on 09/14/20  CBC with Differential/Platelet  Result Value Ref Range   WBC 8.3 3.8 - 10.8 Thousand/uL   RBC 4.44 3.80 - 5.10 Million/uL   Hemoglobin 11.6 (L) 11.7 - 15.5 g/dL   HCT 36.5 35.0 - 45.0 %   MCV 82.2 80.0 - 100.0 fL   MCH 26.1 (L) 27.0 - 33.0 pg   MCHC 31.8 (L) 32.0 - 36.0 g/dL   RDW 20.2 (H) 11.0 - 15.0 %   Platelets 238 140 - 400 Thousand/uL   MPV 9.8 7.5 - 12.5 fL   Neutro Abs 4,739 1,500 - 7,800  cells/uL   Lymphs Abs 2,432 850 - 3,900 cells/uL   Absolute Monocytes 805 200 - 950 cells/uL   Eosinophils Absolute 291 15 - 500 cells/uL   Basophils Absolute 33 0 - 200 cells/uL   Neutrophils Relative % 57.1 %   Total Lymphocyte 29.3 %   Monocytes Relative 9.7 %   Eosinophils Relative 3.5 %   Basophils Relative 0.4 %  COMPLETE METABOLIC PANEL WITH GFR  Result Value Ref Range   Glucose, Bld 44 (L) 65 - 99 mg/dL   BUN 19 7 - 25 mg/dL   Creat 0.97 (H) 0.60 - 0.95  mg/dL   eGFR 58 (L) > OR = 60 mL/min/1.63m2   BUN/Creatinine Ratio 20 6 - 22 (calc)   Sodium 133 (L) 135 - 146 mmol/L   Potassium 4.1 3.5 - 5.3 mmol/L   Chloride 99 98 - 110 mmol/L   CO2 25 20 - 32 mmol/L   Calcium 9.0 8.6 - 10.4 mg/dL   Total Protein 6.5 6.1 - 8.1 g/dL   Albumin 4.2 3.6 - 5.1 g/dL   Globulin 2.3 1.9 - 3.7 g/dL (calc)   AG Ratio 1.8 1.0 - 2.5 (calc)   Total Bilirubin 0.3 0.2 - 1.2 mg/dL   Alkaline phosphatase (APISO) 80 37 - 153 U/L   AST 20 10 - 35 U/L   ALT 20 6 - 29 U/L      Assessment & Plan:   Problem List Items Addressed This Visit   None    Follow up plan: No follow-ups on file.

## 2020-10-02 ENCOUNTER — Telehealth: Payer: Self-pay

## 2020-10-02 NOTE — Telephone Encounter (Signed)
Closing encounter

## 2020-10-05 ENCOUNTER — Other Ambulatory Visit: Payer: Self-pay | Admitting: Nurse Practitioner

## 2020-10-05 DIAGNOSIS — Z1231 Encounter for screening mammogram for malignant neoplasm of breast: Secondary | ICD-10-CM

## 2020-10-10 ENCOUNTER — Other Ambulatory Visit: Payer: Self-pay

## 2020-10-10 ENCOUNTER — Encounter: Payer: Self-pay | Admitting: Nurse Practitioner

## 2020-10-10 ENCOUNTER — Encounter: Payer: Self-pay | Admitting: *Deleted

## 2020-10-10 ENCOUNTER — Ambulatory Visit: Payer: Medicaid Other | Admitting: Nurse Practitioner

## 2020-10-10 ENCOUNTER — Other Ambulatory Visit: Payer: Self-pay | Admitting: *Deleted

## 2020-10-10 VITALS — BP 126/74 | HR 58 | Temp 98.9°F | Resp 16 | Ht <= 58 in | Wt 156.0 lb

## 2020-10-10 DIAGNOSIS — J302 Other seasonal allergic rhinitis: Secondary | ICD-10-CM

## 2020-10-10 DIAGNOSIS — D649 Anemia, unspecified: Secondary | ICD-10-CM

## 2020-10-10 DIAGNOSIS — E1122 Type 2 diabetes mellitus with diabetic chronic kidney disease: Secondary | ICD-10-CM | POA: Diagnosis not present

## 2020-10-10 DIAGNOSIS — N182 Chronic kidney disease, stage 2 (mild): Secondary | ICD-10-CM

## 2020-10-10 DIAGNOSIS — Z794 Long term (current) use of insulin: Secondary | ICD-10-CM | POA: Diagnosis not present

## 2020-10-10 DIAGNOSIS — Z974 Presence of external hearing-aid: Secondary | ICD-10-CM | POA: Diagnosis not present

## 2020-10-10 DIAGNOSIS — F4321 Adjustment disorder with depressed mood: Secondary | ICD-10-CM | POA: Diagnosis not present

## 2020-10-10 DIAGNOSIS — J4540 Moderate persistent asthma, uncomplicated: Secondary | ICD-10-CM | POA: Diagnosis not present

## 2020-10-10 LAB — HM DIABETES EYE EXAM

## 2020-10-10 MED ORDER — IRON (FERROUS SULFATE) 325 (65 FE) MG PO TABS: 325.0000 mg | ORAL_TABLET | Freq: Every day | ORAL | 11 refills | Status: DC

## 2020-10-10 MED ORDER — LORATADINE 10 MG PO TABS: ORAL_TABLET | ORAL | 1 refills | Status: DC

## 2020-10-10 MED ORDER — INSULIN PEN NEEDLE 32G X 4 MM MISC: 11 refills | Status: DC

## 2020-10-10 NOTE — Assessment & Plan Note (Signed)
Referral placed to Audiology to discuss getting new hearing aid.

## 2020-10-10 NOTE — Assessment & Plan Note (Signed)
Chronic.  Last A1c at goal.  Continue current insulin regimen.  Will send in refills for insulin needles so she does not run out early.

## 2020-10-10 NOTE — Progress Notes (Signed)
Subjective:    Patient ID: Joyce Gilmore, female    DOB: 03/07/1934, 86 y.o.   MRN: [id number]  HPI: Joyce Gilmore is a 86 y.o. female presenting with daughter, Tobi Bastos, for follow up.  Interpreter used throughout Audiological scientist, Misty Stanley.  Chief Complaint  Patient presents with   Follow-up    Is fasting   ASTHMA Breathing is better.  She is using the rescue machine only when she needs it - when whistling.  Has used only twice since last visit. New inhaler is working well.   Asthma status: controlled Satisfied with current treatment?: yes Albuterol/rescue inhaler frequency: less than once per week Dyspnea frequency: less than once per week Wheezing frequency: less than once per week Cough frequency: daily, not enough to need inhaler Nocturnal symptom frequency: rare Limitation of activity: no Current upper respiratory symptoms: no Visits to ER or Urgent Care in past year: yes Pneumovax: Up to Date Influenza: Up to Date   GRIEF Is not taking the sertraline consistently.  She reports her mood is better without it.  She felt the change was related to the sudden loss of her nephew and she is doing much better now.  Wondering about the anemia and if she needs to start taking iron again.  She keeps running out of insulin needles.  Daughter reports the Pharmacy will only give her 100 at a time and she needs 4 injection per day, so she needs 120 per prescription to get all of her medicine.    Daughter reports hearing aids broke and is requesting referral to hearing doctor today so they can discuss replacement.  Allergies  Allergen Reactions   Bactrim [Sulfamethoxazole-Trimethoprim] Other (See Comments)    Hyperkalemia (elevated potassium)   Rifampin Other (See Comments)    Severe thrombocytopenia (low blood platelet count)   Vancomycin Other (See Comments)    Severe thrombocytopenia (low blood platelet count)    Outpatient Encounter Medications as of 10/10/2020  Medication Sig    Acetaminophen (TYLENOL PO) Take 1,000 tablets by mouth every 6 (six) hours as needed (pain/headache).    albuterol (PROVENTIL) (2.5 MG/3ML) 0.083% nebulizer solution Take 3 mLs (2.5 mg total) by nebulization every 6 (six) hours as needed for wheezing or shortness of breath.   albuterol (VENTOLIN HFA) 108 (90 Base) MCG/ACT inhaler Inhale 1-2 puffs into the lungs every 4 (four) hours as needed for wheezing or shortness of breath.   amLODipine (NORVASC) 5 MG tablet TAKE 1 TABLET(5 MG) BY MOUTH DAILY   aspirin (ASPIRIN 81) 81 MG EC tablet Take 1 tablet (81 mg total) by mouth daily. Swallow whole.   Blood Glucose Monitoring Suppl (BLOOD GLUCOSE SYSTEM PAK) KIT Please dispense based on patient and insurance preference.  Use as directed to monitor FSBS four times daily.  Dx E11.22   budesonide-formoterol (SYMBICORT) 160-4.5 MCG/ACT inhaler Inhale 2 puffs into the lungs 2 (two) times daily.   Calcium Carbonate-Vit D-Min (CALCIUM 1200 PO) Take by mouth.   furosemide (LASIX) 20 MG tablet Take 1 tablet (20 mg total) by mouth daily.   glucose blood test strip Please dispense based on patient and insurance preference.  Use as directed to monitor FSBS four times daily.  Dx E11.22   insulin aspart (NOVOLOG FLEXPEN) 100 UNIT/ML FlexPen ADMINISTER 5 TO 15 UNITS UNDER THE SKIN THREE TIMES DAILY WITH MEALS PER SLIDING SCALE   insulin glargine (LANTUS SOLOSTAR) 100 UNIT/ML Solostar Pen INJECT  38 UNITS UNDER THE SKIN AT BEDTIME   Lancets  MISC Please dispense based on patient and insurance preference.  Use as directed to monitor FSBS four times daily.  Dx E11.22   lansoprazole (PREVACID) 30 MG capsule TAKE ONE CAPSULE BY MOUTH DAILY AT 12 NOON   lisinopril (ZESTRIL) 10 MG tablet Take 1 tablet (10 mg total) by mouth daily.   metoprolol tartrate (LOPRESSOR) 25 MG tablet TAKE 1/2 TABLET(12.5 MG) BY MOUTH TWICE DAILY   polyvinyl alcohol (ARTIFICIAL TEARS) 1.4 % ophthalmic solution Place 1 drop into both eyes as needed for  dry eyes.   pravastatin (PRAVACHOL) 20 MG tablet TAKE 1 TABLET(20 MG) BY MOUTH DAILY   sucralfate (CARAFATE) 1 g tablet TAKE 1 TABLET(1 GRAM) BY MOUTH FOUR TIMES DAILY   tizanidine (ZANAFLEX) 2 MG capsule Take 1 capsule (2 mg total) by mouth 3 (three) times daily.   traMADol (ULTRAM) 50 MG tablet Take 1-2 tablets (50-100 mg total) by mouth every 6 (six) hours as needed.   [DISCONTINUED] Fluticasone Furoate (ARNUITY ELLIPTA) 100 MCG/ACT AEPB Inhale 1 puff into the lungs daily. Rinse mouth and spit after each use.   Iron, Ferrous Sulfate, 325 (65 Fe) MG TABS Take 325 mg by mouth daily. TAKE IN AM WITH ORANGE JUICE.   loratadine (CLARITIN) 10 MG tablet TAKE 1 TABLET(10 MG) BY MOUTH DAILY AS NEEDED FOR ALLERGIES   [DISCONTINUED] Iron, Ferrous Sulfate, 325 (65 Fe) MG TABS Take 325 mg by mouth daily. TAKE IN AM WITH ORANGE JUICE.   [DISCONTINUED] lidocaine (LIDODERM) 5 % Place 1 patch onto the skin daily as needed. Apply patch to area most significant pain once per day.  Remove and discard patch within 12 hours of application.   [DISCONTINUED] loratadine (CLARITIN) 10 MG tablet TAKE 1 TABLET(10 MG) BY MOUTH DAILY AS NEEDED FOR ALLERGIES   [DISCONTINUED] MITIGARE 0.6 MG CAPS TAKE 1 CAPSULE BY MOUTH DAILY   [DISCONTINUED] sertraline (ZOLOFT) 25 MG tablet Take 1 tablet (25 mg total) by mouth daily.   No facility-administered encounter medications on file as of 10/10/2020.    Patient Active Problem List   Diagnosis Date Noted   Type 2 DM with CKD stage 3 and hypertension ([place]) 07/12/2020   Chronic kidney disease 05/07/2018   GERD (gastroesophageal reflux disease) 03/10/2017   Uses hearing aid 09/12/2016   Asthmatic bronchitis 03/11/2016   Chronic diastolic heart failure ([place]) 10/12/2015   Seasonal allergies 11/21/2014   DDD (degenerative disc disease), lumbar 10/11/2014   H/O compression fracture of spine 10/11/2014   Pulmonary hypertension ([place])    Hepatic cirrhosis ([place]) 09/24/2014   SIADH  (syndrome of inappropriate ADH production) ([place]) 06/13/2014   Insomnia 12/28/2013   Anemia 07/06/2013   HLD (hyperlipidemia) 06/27/2013   Osteoporosis 07/26/2012   Hyponatremia 05/16/2011   Type 2 diabetes mellitus with diabetic chronic kidney disease ([place]) 08/17/2006   Essential hypertension 08/17/2006   DIVERTICULOSIS, COLON 08/17/2006    Past Medical History:  Diagnosis Date   Arthritis    CHF (congestive heart failure) (HCC)    diastolic   Chronic kidney disease    hx of kidney stones,    Depression    hx of    Diabetes mellitus without complication (HCC)    Ganglion, left ankle and foot    Hearing loss of both ears    Hypertension    MRSA infection    Oct 13 - Nov 13   Osteoporosis    Tinnitus of both ears     Relevant past medical, surgical, family and social history reviewed and  updated as indicated. Interim medical history since our last visit reviewed.  Review of Systems Per HPI unless specifically indicated above     Objective:    BP 126/74   Pulse (!) 58   Temp 98.9 F (37.2 C) (Temporal)   Resp 16   Ht 4\' 8"  (1.422 m)   Wt 156 lb (70.8 kg)   SpO2 94%   BMI 34.97 kg/m   Wt Readings from Last 3 Encounters:  10/10/20 156 lb (70.8 kg)  09/14/20 157 lb (71.2 kg)  08/30/20 157 lb (71.2 kg)    Physical Exam Vitals and nursing note reviewed.  Constitutional:      General: She is not in acute distress.    Appearance: Normal appearance. She is obese. She is not toxic-appearing.  Cardiovascular:     Rate and Rhythm: Normal rate and regular rhythm.     Heart sounds: Normal heart sounds. No murmur heard. Pulmonary:     Effort: Pulmonary effort is normal. No respiratory distress.     Breath sounds: Normal breath sounds. No wheezing, rhonchi or rales.  Skin:    General: Skin is warm and dry.     Capillary Refill: Capillary refill takes less than 2 seconds.     Coloration: Skin is not jaundiced or pale.     Findings: No erythema.  Neurological:      Mental Status: She is alert and oriented to person, place, and time.     Motor: No weakness.     Gait: Gait normal.  Psychiatric:        Mood and Affect: Mood normal.        Behavior: Behavior normal.        Thought Content: Thought content normal.        Judgment: Judgment normal.      Assessment & Plan:   Problem List Items Addressed This Visit       Respiratory   Asthmatic bronchitis    Chronic, stable with Symbicort.  Will continue twice daily.  Discussed Symbicort vs. Rescue inhaler at length today with daughter.  Should not need rescue inhaler more than a couple of times per week.  If she is requiring more frequently, return to clinic.  Otherwise, follow up in 3 months.         Endocrine   Type 2 diabetes mellitus with diabetic chronic kidney disease (HCC) - Primary    Chronic.  Last A1c at goal.  Continue current insulin regimen.  Will send in refills for insulin needles so she does not run out early.        Other   Uses hearing aid    Referral placed to Audiology to discuss getting new hearing aid.      Relevant Orders   Ambulatory referral to Audiology   Seasonal allergies    Chronic.  Previously took Claritin during season changes with good benefit.  Refill given today - start before season starts to change to prevent upper respiratory infection.       Relevant Medications   loratadine (CLARITIN) 10 MG tablet   Anemia    RDW remains slightly elevated on last check.  Patient denies active bleeding - query anemia of chronic disease.  Resume iron supplementation - prescription sent into pharmacy.  Will recheck CBC at next follow up visit.      Relevant Medications   Iron, Ferrous Sulfate, 325 (65 Fe) MG TABS   Other Visit Diagnoses     Grief  Mood is better, not taking sertraline.  Continue to closely monitor, follow up in ~2 months.        Follow up plan: Return for early October follow up with labs.

## 2020-10-10 NOTE — Assessment & Plan Note (Signed)
RDW remains slightly elevated on last check.  Patient denies active bleeding - query anemia of chronic disease.  Resume iron supplementation - prescription sent into pharmacy.  Will recheck CBC at next follow up visit.

## 2020-10-10 NOTE — Assessment & Plan Note (Signed)
Chronic, stable with Symbicort.  Will continue twice daily.  Discussed Symbicort vs. Rescue inhaler at length today with daughter.  Should not need rescue inhaler more than a couple of times per week.  If she is requiring more frequently, return to clinic.  Otherwise, follow up in 3 months.

## 2020-10-10 NOTE — Assessment & Plan Note (Signed)
Chronic.  Previously took Claritin during season changes with good benefit.  Refill given today - start before season starts to change to prevent upper respiratory infection.

## 2020-10-12 ENCOUNTER — Other Ambulatory Visit: Payer: Self-pay | Admitting: Nurse Practitioner

## 2020-11-05 ENCOUNTER — Other Ambulatory Visit: Payer: Self-pay

## 2020-11-05 ENCOUNTER — Ambulatory Visit
Admission: RE | Admit: 2020-11-05 | Discharge: 2020-11-05 | Disposition: A | Discharge status: Home / Self Care | Payer: Medicaid Other | Source: Ambulatory Visit | Attending: Nurse Practitioner | Admitting: Nurse Practitioner

## 2020-11-05 DIAGNOSIS — Z1231 Encounter for screening mammogram for malignant neoplasm of breast: Secondary | ICD-10-CM

## 2020-11-06 ENCOUNTER — Ambulatory Visit: Payer: Medicaid Other | Admitting: Audiologist

## 2020-11-07 ENCOUNTER — Encounter: Payer: Self-pay | Admitting: Nurse Practitioner

## 2020-11-07 ENCOUNTER — Other Ambulatory Visit: Payer: Self-pay

## 2020-11-07 ENCOUNTER — Other Ambulatory Visit: Payer: Self-pay | Admitting: *Deleted

## 2020-11-07 ENCOUNTER — Ambulatory Visit (INDEPENDENT_AMBULATORY_CARE_PROVIDER_SITE_OTHER): Payer: Medicaid Other | Admitting: Nurse Practitioner

## 2020-11-07 VITALS — BP 138/74 | HR 62 | Ht <= 58 in | Wt 161.4 lb

## 2020-11-07 DIAGNOSIS — Z974 Presence of external hearing-aid: Secondary | ICD-10-CM

## 2020-11-07 DIAGNOSIS — Z794 Long term (current) use of insulin: Secondary | ICD-10-CM | POA: Diagnosis not present

## 2020-11-07 DIAGNOSIS — D649 Anemia, unspecified: Secondary | ICD-10-CM

## 2020-11-07 DIAGNOSIS — I129 Hypertensive chronic kidney disease with stage 1 through stage 4 chronic kidney disease, or unspecified chronic kidney disease: Secondary | ICD-10-CM

## 2020-11-07 DIAGNOSIS — N1831 Chronic kidney disease, stage 3a: Secondary | ICD-10-CM

## 2020-11-07 DIAGNOSIS — K219 Gastro-esophageal reflux disease without esophagitis: Secondary | ICD-10-CM

## 2020-11-07 DIAGNOSIS — I10 Essential (primary) hypertension: Secondary | ICD-10-CM

## 2020-11-07 DIAGNOSIS — E1122 Type 2 diabetes mellitus with diabetic chronic kidney disease: Secondary | ICD-10-CM | POA: Diagnosis not present

## 2020-11-07 DIAGNOSIS — N183 Chronic kidney disease, stage 3 unspecified: Secondary | ICD-10-CM

## 2020-11-07 DIAGNOSIS — E7849 Other hyperlipidemia: Secondary | ICD-10-CM

## 2020-11-07 DIAGNOSIS — N182 Chronic kidney disease, stage 2 (mild): Secondary | ICD-10-CM | POA: Diagnosis not present

## 2020-11-07 MED ORDER — INSULIN PEN NEEDLE 32G X 4 MM MISC: 11 refills | Status: DC

## 2020-11-07 MED ORDER — SUCRALFATE 1 G PO TABS: ORAL_TABLET | ORAL | 1 refills | Status: AC

## 2020-11-07 MED ORDER — LANSOPRAZOLE 30 MG PO CPDR: DELAYED_RELEASE_CAPSULE | ORAL | 1 refills | Status: DC

## 2020-11-07 MED ORDER — LISINOPRIL 10 MG PO TABS: 10.0000 mg | ORAL_TABLET | Freq: Every day | ORAL | 1 refills | Status: DC

## 2020-11-07 MED ORDER — METOPROLOL TARTRATE 25 MG PO TABS: ORAL_TABLET | ORAL | 3 refills | Status: DC

## 2020-11-07 MED ORDER — AMLODIPINE BESYLATE 5 MG PO TABS: ORAL_TABLET | ORAL | 1 refills | Status: DC

## 2020-11-07 MED ORDER — PRAVASTATIN SODIUM 20 MG PO TABS: ORAL_TABLET | ORAL | 1 refills | Status: DC

## 2020-11-07 MED ORDER — LANTUS SOLOSTAR 100 UNIT/ML ~~LOC~~ SOPN: PEN_INJECTOR | SUBCUTANEOUS | 5 refills | Status: DC

## 2020-11-07 MED ORDER — NOVOLOG FLEXPEN 100 UNIT/ML ~~LOC~~ SOPN: PEN_INJECTOR | SUBCUTANEOUS | 1 refills | Status: DC

## 2020-11-07 MED ORDER — FUROSEMIDE 20 MG PO TABS: 20.0000 mg | ORAL_TABLET | Freq: Every day | ORAL | 3 refills | Status: DC

## 2020-11-07 NOTE — Progress Notes (Signed)
Subjective:    Patient ID: Joyce Gilmore, female    DOB: 01/19/1935, 86 y.o.   MRN: [id number]  HPI: Joyce Gilmore is a 86 y.o. female presenting with son for follow up.  Chief Complaint  Patient presents with   Follow-up    2 month follow up, pt is going to [place] and needs refill on medication , and needs a referral for ear hear loss for hearing aid    Reports she is going to [place] next week for a couple of months.  She has a family matter that needs to be dealt with.  She is not sure when she will return.   ASTHMA Has phlegm sometimes, nebulizer machine helps. Using nebulizer once every couple of weeks.  Using daily inhaler every day. Does not need refills of nebulizer medication. Asthma status: stable Satisfied with current treatment?: yes Albuterol/rescue inhaler frequency: once every couple of weeks Dyspnea frequency: weekly Wheezing frequency: weekly Cough frequency: weekly Nocturnal symptom frequency:  rarely Limitation of activity: no Current upper respiratory symptoms: no Pneumovax: Up to Date Influenza: Not up to Date  GRIEF Reports mood is stable.  She is not taking sertraline and does not feel like she needs it.  She goes for walks frequently which helps get her mind off of things.  Depression screen [facility] 2/9 11/07/2020 08/30/2020 04/02/2020 03/22/2019 12/10/2018  Decreased Interest 0 1 0 0 0  Down, Depressed, Hopeless 0 1 0 0 1  PHQ - 2 Score 0 2 0 0 1  Altered sleeping - 1 - - 0  Tired, decreased energy - 1 - - 0  Change in appetite - 2 - - 0  Feeling bad or failure about yourself  - 0 - - 0  Trouble concentrating - 0 - - 0  Moving slowly or fidgety/restless - 0 - - 0  Suicidal thoughts - 0 - - 0  PHQ-9 Score - 6 - - 1  Difficult doing work/chores - Somewhat difficult - - Not difficult at all  Some recent data might be hidden   HISTORY OF ANEMIA Denies any current bleeding, she is taking daily ferrous sulfate.   Anemia status: stable Etiology of  anemia: ?anemia of chronic disease Duration of anemia treatment: months Compliance with treatment: excellent compliance Iron supplementation side effects: no Severity of anemia: mild Fatigue: no Decreased exercise tolerance: no  Dyspnea on exertion: no Palpitations: no Bleeding: no Pica: no  DIABETES Requesting refills of lancets/strips/insulin pen needles.  Says the pharmacy never gives her enough, so she runs out before the month is up. Hypoglycemic episodes:no Polydipsia/polyuria: no Visual disturbance: no Chest pain: no Paresthesias: no Glucose Monitoring: yes  Accucheck frequency: twice daily  Fasting glucose: ~150  Evening: ~180 Taking Insulin?: yes  Long acting insulin: 38 u at bedtime  Short acting insulin: 10 units with meals depending on blood sugar Blood Pressure Monitoring: not checking Retinal Examination: Up to Date Foot Exam: Not up to Date Diabetic Education: Completed Pneumovax: Up to Date Influenza: Not up to Date Aspirin: yes  Hearing loss - she is requesting referral to Audiologist today to have hearing aids re-fit for her ears.    Allergies  Allergen Reactions   Bactrim [Sulfamethoxazole-Trimethoprim] Other (See Comments)    Hyperkalemia (elevated potassium)   Rifampin Other (See Comments)    Severe thrombocytopenia (low blood platelet count)   Vancomycin Other (See Comments)    Severe thrombocytopenia (low blood platelet count)    Outpatient  Encounter Medications as of 11/07/2020  Medication Sig   Acetaminophen (TYLENOL PO) Take 1,000 tablets by mouth every 6 (six) hours as needed (pain/headache).    albuterol (PROVENTIL) (2.5 MG/3ML) 0.083% nebulizer solution Take 3 mLs (2.5 mg total) by nebulization every 6 (six) hours as needed for wheezing or shortness of breath.   albuterol (VENTOLIN HFA) 108 (90 Base) MCG/ACT inhaler Inhale 1-2 puffs into the lungs every 4 (four) hours as needed for wheezing or shortness of breath.   aspirin (ASPIRIN 81)  81 MG EC tablet Take 1 tablet (81 mg total) by mouth daily. Swallow whole.   Blood Glucose Monitoring Suppl (BLOOD GLUCOSE SYSTEM PAK) KIT Please dispense based on patient and insurance preference.  Use as directed to monitor FSBS four times daily.  Dx E11.22   budesonide-formoterol (SYMBICORT) 160-4.5 MCG/ACT inhaler Inhale 2 puffs into the lungs 2 (two) times daily.   Calcium Carbonate-Vit D-Min (CALCIUM 1200 PO) Take by mouth.   glucose blood test strip Please dispense based on patient and insurance preference.  Use as directed to monitor FSBS four times daily.  Dx E11.22   Iron, Ferrous Sulfate, 325 (65 Fe) MG TABS Take 325 mg by mouth daily. TAKE IN AM WITH ORANGE JUICE.   Lancets MISC Please dispense based on patient and insurance preference.  Use as directed to monitor FSBS four times daily.  Dx E11.22   loratadine (CLARITIN) 10 MG tablet TAKE 1 TABLET(10 MG) BY MOUTH DAILY AS NEEDED FOR ALLERGIES   polyvinyl alcohol (ARTIFICIAL TEARS) 1.4 % ophthalmic solution Place 1 drop into both eyes as needed for dry eyes.   tizanidine (ZANAFLEX) 2 MG capsule Take 1 capsule (2 mg total) by mouth 3 (three) times daily.   traMADol (ULTRAM) 50 MG tablet Take 1-2 tablets (50-100 mg total) by mouth every 6 (six) hours as needed.   [DISCONTINUED] amLODipine (NORVASC) 5 MG tablet TAKE 1 TABLET(5 MG) BY MOUTH DAILY   [DISCONTINUED] furosemide (LASIX) 20 MG tablet Take 1 tablet (20 mg total) by mouth daily.   [DISCONTINUED] insulin aspart (NOVOLOG FLEXPEN) 100 UNIT/ML FlexPen ADMINISTER 5 TO 15 UNITS UNDER THE SKIN THREE TIMES DAILY WITH MEALS PER SLIDING SCALE   [DISCONTINUED] insulin glargine (LANTUS SOLOSTAR) 100 UNIT/ML Solostar Pen INJECT  38 UNITS UNDER THE SKIN AT BEDTIME   [DISCONTINUED] Insulin Pen Needle 32G X 4 MM MISC USE AS DIRECTED TO INJECT INSULIN 4X DAILY. DX: E11.9.   [DISCONTINUED] lansoprazole (PREVACID) 30 MG capsule TAKE ONE CAPSULE BY MOUTH DAILY AT 12 NOON   [DISCONTINUED] lisinopril  (ZESTRIL) 10 MG tablet Take 1 tablet (10 mg total) by mouth daily.   [DISCONTINUED] metoprolol tartrate (LOPRESSOR) 25 MG tablet TAKE 1/2 TABLET(12.5 MG) BY MOUTH TWICE DAILY   [DISCONTINUED] pravastatin (PRAVACHOL) 20 MG tablet TAKE 1 TABLET(20 MG) BY MOUTH DAILY   [DISCONTINUED] sucralfate (CARAFATE) 1 g tablet TAKE 1 TABLET(1 GRAM) BY MOUTH FOUR TIMES DAILY   amLODipine (NORVASC) 5 MG tablet TAKE 1 TABLET(5 MG) BY MOUTH DAILY   furosemide (LASIX) 20 MG tablet Take 1 tablet (20 mg total) by mouth daily.   insulin aspart (NOVOLOG FLEXPEN) 100 UNIT/ML FlexPen ADMINISTER 5 TO 15 UNITS UNDER THE SKIN THREE TIMES DAILY WITH MEALS PER SLIDING SCALE   insulin glargine (LANTUS SOLOSTAR) 100 UNIT/ML Solostar Pen INJECT  38 UNITS UNDER THE SKIN AT BEDTIME   Insulin Pen Needle 32G X 4 MM MISC USE AS DIRECTED TO INJECT INSULIN 4X DAILY. DX: E11.9.   lansoprazole (PREVACID) 30 MG  capsule TAKE ONE CAPSULE BY MOUTH DAILY AT 12 NOON   lisinopril (ZESTRIL) 10 MG tablet Take 1 tablet (10 mg total) by mouth daily.   metoprolol tartrate (LOPRESSOR) 25 MG tablet TAKE 1/2 TABLET(12.5 MG) BY MOUTH TWICE DAILY   pravastatin (PRAVACHOL) 20 MG tablet TAKE 1 TABLET(20 MG) BY MOUTH DAILY   sucralfate (CARAFATE) 1 g tablet TAKE 1 TABLET(1 GRAM) BY MOUTH FOUR TIMES DAILY   No facility-administered encounter medications on file as of 11/07/2020.    Patient Active Problem List   Diagnosis Date Noted   Type 2 DM with CKD stage 3 and hypertension ([place]) 07/12/2020   Chronic kidney disease 05/07/2018   GERD (gastroesophageal reflux disease) 03/10/2017   Uses hearing aid 09/12/2016   Asthmatic bronchitis 03/11/2016   Chronic diastolic heart failure ([place]) 10/12/2015   Seasonal allergies 11/21/2014   DDD (degenerative disc disease), lumbar 10/11/2014   H/O compression fracture of spine 10/11/2014   Pulmonary hypertension ([place])    Hepatic cirrhosis ([place]) 09/24/2014   SIADH (syndrome of inappropriate ADH production) ([place])  06/13/2014   Insomnia 12/28/2013   Anemia 07/06/2013   HLD (hyperlipidemia) 06/27/2013   Osteoporosis 07/26/2012   Hyponatremia 05/16/2011   Type 2 diabetes mellitus with diabetic chronic kidney disease ([place]) 08/17/2006   Essential hypertension 08/17/2006   DIVERTICULOSIS, COLON 08/17/2006    Past Medical History:  Diagnosis Date   Arthritis    CHF (congestive heart failure) (HCC)    diastolic   Chronic kidney disease    hx of kidney stones,    Depression    hx of    Diabetes mellitus without complication (HCC)    Ganglion, left ankle and foot    Hearing loss of both ears    Hypertension    MRSA infection    Oct 13 - Nov 13   Osteoporosis    Tinnitus of both ears     Relevant past medical, surgical, family and social history reviewed and updated as indicated. Interim medical history since our last visit reviewed.  Review of Systems Per HPI unless specifically indicated above     Objective:    BP 138/74 (BP Location: Left Arm, Patient Position: Sitting, Cuff Size: Normal)   Pulse 62   Ht $R'4\' 8"'tl$  (1.422 m)   Wt 161 lb 6.4 oz (73.2 kg)   SpO2 94%   BMI 36.19 kg/m   Wt Readings from Last 3 Encounters:  11/07/20 161 lb 6.4 oz (73.2 kg)  10/10/20 156 lb (70.8 kg)  09/14/20 157 lb (71.2 kg)    Physical Exam Vitals and nursing note reviewed.  Constitutional:      General: She is not in acute distress.    Appearance: Normal appearance. She is obese. She is not toxic-appearing.  Eyes:     General: No scleral icterus.    Extraocular Movements: Extraocular movements intact.  Cardiovascular:     Rate and Rhythm: Normal rate and regular rhythm.     Heart sounds: Normal heart sounds. No murmur heard. Pulmonary:     Effort: Pulmonary effort is normal. No respiratory distress.     Breath sounds: Normal breath sounds. No wheezing, rhonchi or rales.  Skin:    General: Skin is warm and dry.     Capillary Refill: Capillary refill takes less than 2 seconds.     Coloration:  Skin is not jaundiced or pale.     Findings: No erythema.  Neurological:     Mental Status: She is alert and oriented  to person, place, and time.     Motor: No weakness.     Gait: Gait normal.  Psychiatric:        Mood and Affect: Mood normal.        Behavior: Behavior normal.        Thought Content: Thought content normal.        Judgment: Judgment normal.      Assessment & Plan:   Problem List Items Addressed This Visit       Cardiovascular and Mediastinum   Type 2 DM with CKD stage 3 and hypertension (HCC)    Chronic.  Reportedly, blood sugar readings have been stable at home.  Continue current insulin regimen and check hgbA1c today.  Needs foot exam at next appointment.  Eye exam up to date.  UTD on pneumonia vaccine, will get high dose flu shot when we receive it.  Follow up in 3 months or when she returns from international travel.      Relevant Medications   pravastatin (PRAVACHOL) 20 MG tablet   furosemide (LASIX) 20 MG tablet   insulin glargine (LANTUS SOLOSTAR) 100 UNIT/ML Solostar Pen   insulin aspart (NOVOLOG FLEXPEN) 100 UNIT/ML FlexPen   metoprolol tartrate (LOPRESSOR) 25 MG tablet   lisinopril (ZESTRIL) 10 MG tablet   amLODipine (NORVASC) 5 MG tablet   Essential hypertension    Chronic.  BP is acceptable in clinic today.  Continue current medications, refills sent in for 6 months so she does not run out while in [place].  Check kidney function and electrolytes today.  Follow up in 3 months or when she returns from international travel.      Relevant Medications   pravastatin (PRAVACHOL) 20 MG tablet   furosemide (LASIX) 20 MG tablet   metoprolol tartrate (LOPRESSOR) 25 MG tablet   lisinopril (ZESTRIL) 10 MG tablet   amLODipine (NORVASC) 5 MG tablet     Digestive   GERD (gastroesophageal reflux disease)    Chronic.  Continue lansoprazole 30 mg daily for now.  Refills sent in.       Relevant Medications   lansoprazole (PREVACID) 30 MG capsule   sucralfate  (CARAFATE) 1 g tablet     Endocrine   Type 2 diabetes mellitus with diabetic chronic kidney disease ([place]) - Primary    Chronic.  Reportedly, blood sugar readings have been stable at home.  Continue current insulin regimen and check hgbA1c today.  Needs foot exam at next appointment.  Eye exam up to date.  UTD on pneumonia vaccine, will get high dose flu shot when we receive it.  Follow up in 3 months or when she returns from international travel.       Relevant Medications   pravastatin (PRAVACHOL) 20 MG tablet   insulin glargine (LANTUS SOLOSTAR) 100 UNIT/ML Solostar Pen   Insulin Pen Needle 32G X 4 MM MISC   insulin aspart (NOVOLOG FLEXPEN) 100 UNIT/ML FlexPen   lisinopril (ZESTRIL) 10 MG tablet   Other Relevant Orders   COMPLETE METABOLIC PANEL WITH GFR (Completed)   Hemoglobin A1c (Completed)     Genitourinary   Chronic kidney disease    Recheck kidney function today.  Consider nephrology referral when patient returns from [place] if kidney function has worsened to stage III b.       Relevant Orders   CBC with Differential/Platelet (Completed)     Other   Uses hearing aid   Relevant Orders   Ambulatory referral to Audiology   HLD (hyperlipidemia)  Chronic.  Refill given for pravastatin 20 mg daily.  Patient not fasting, check lipids at next appointment.  Follow up in 3 months or when patient returns from international travel.       Relevant Medications   pravastatin (PRAVACHOL) 20 MG tablet   furosemide (LASIX) 20 MG tablet   metoprolol tartrate (LOPRESSOR) 25 MG tablet   lisinopril (ZESTRIL) 10 MG tablet   amLODipine (NORVASC) 5 MG tablet   Anemia    Recheck CBC today, continue iron supplementation for now.  Denies active bleeding, query anemia of chronic disease.      Relevant Orders   CBC with Differential/Platelet (Completed)    Of note, after discussion on safety/risk of length international travel, patient tells me it is not that she wants to travel, but she  must travel to [place] to take care of family matters. I have advised she and her son of the risk of international travel and being away from health care providers with her chronic diseases and risk for quick decompensation.  Patient is aware of these risks and tells me she will return immediately if she feels unwell.  She is competent to make her own decisions and I have advised her of the risks.    Follow up plan: Return for 3 months HTN/HLD/DM, whe she returns from [place].

## 2020-11-08 LAB — CBC WITH DIFFERENTIAL/PLATELET
Absolute Monocytes: 813 cells/uL (ref 200–950)
Basophils Absolute: 69 cells/uL (ref 0–200)
Basophils Relative: 0.7 %
Eosinophils Absolute: 176 cells/uL (ref 15–500)
Eosinophils Relative: 1.8 %
HCT: 39.6 % (ref 35.0–45.0)
Hemoglobin: 12.7 g/dL (ref 11.7–15.5)
Lymphs Abs: 2705 cells/uL (ref 850–3900)
MCH: 28.2 pg (ref 27.0–33.0)
MCHC: 32.1 g/dL (ref 32.0–36.0)
MCV: 87.8 fL (ref 80.0–100.0)
MPV: 10.4 fL (ref 7.5–12.5)
Monocytes Relative: 8.3 %
Neutro Abs: 6037 cells/uL (ref 1500–7800)
Neutrophils Relative %: 61.6 %
Platelets: 211 10*3/uL (ref 140–400)
RBC: 4.51 10*6/uL (ref 3.80–5.10)
RDW: 13.2 % (ref 11.0–15.0)
Total Lymphocyte: 27.6 %
WBC: 9.8 10*3/uL (ref 3.8–10.8)

## 2020-11-08 LAB — COMPLETE METABOLIC PANEL WITH GFR
AG Ratio: 1.5 (calc) (ref 1.0–2.5)
ALT: 15 U/L (ref 6–29)
AST: 17 U/L (ref 10–35)
Albumin: 4.2 g/dL (ref 3.6–5.1)
Alkaline phosphatase (APISO): 79 U/L (ref 37–153)
BUN/Creatinine Ratio: 30 (calc) — ABNORMAL HIGH (ref 6–22)
BUN: 37 mg/dL — ABNORMAL HIGH (ref 7–25)
CO2: 26 mmol/L (ref 20–32)
Calcium: 9.5 mg/dL (ref 8.6–10.4)
Chloride: 99 mmol/L (ref 98–110)
Creat: 1.22 mg/dL — ABNORMAL HIGH (ref 0.60–0.95)
Globulin: 2.8 g/dL (calc) (ref 1.9–3.7)
Glucose, Bld: 148 mg/dL — ABNORMAL HIGH (ref 65–99)
Potassium: 5 mmol/L (ref 3.5–5.3)
Sodium: 134 mmol/L — ABNORMAL LOW (ref 135–146)
Total Bilirubin: 0.3 mg/dL (ref 0.2–1.2)
Total Protein: 7 g/dL (ref 6.1–8.1)
eGFR: 44 mL/min/{1.73_m2} — ABNORMAL LOW (ref >=60)

## 2020-11-08 LAB — HEMOGLOBIN A1C
Hgb A1c MFr Bld: 7.3 % of total Hgb — ABNORMAL HIGH (ref <5.7)
Mean Plasma Glucose: 163 mg/dL
eAG (mmol/L): 9 mmol/L

## 2020-11-12 NOTE — Assessment & Plan Note (Signed)
Recheck kidney function today.  Consider nephrology referral when patient returns from [place] if kidney function has worsened to stage III b.

## 2020-11-12 NOTE — Assessment & Plan Note (Signed)
Chronic.  BP is acceptable in clinic today.  Continue current medications, refills sent in for 6 months so she does not run out while in [place].  Check kidney function and electrolytes today.  Follow up in 3 months or when she returns from international travel.

## 2020-11-12 NOTE — Assessment & Plan Note (Signed)
Recheck CBC today, continue iron supplementation for now.  Denies active bleeding, query anemia of chronic disease.

## 2020-11-12 NOTE — Assessment & Plan Note (Signed)
Chronic.  Continue lansoprazole 30 mg daily for now.  Refills sent in.

## 2020-11-12 NOTE — Assessment & Plan Note (Signed)
Chronic.  Reportedly, blood sugar readings have been stable at home.  Continue current insulin regimen and check hgbA1c today.  Needs foot exam at next appointment.  Eye exam up to date.  UTD on pneumonia vaccine, will get high dose flu shot when we receive it.  Follow up in 3 months or when she returns from international travel.

## 2020-11-12 NOTE — Assessment & Plan Note (Signed)
Chronic.  Refill given for pravastatin 20 mg daily.  Patient not fasting, check lipids at next appointment.  Follow up in 3 months or when patient returns from international travel.

## 2020-12-13 ENCOUNTER — Ambulatory Visit: Payer: Medicaid Other | Admitting: Nurse Practitioner

## 2021-01-21 ENCOUNTER — Other Ambulatory Visit: Payer: Self-pay

## 2021-01-21 DIAGNOSIS — Z794 Long term (current) use of insulin: Secondary | ICD-10-CM

## 2021-01-21 MED ORDER — GLUCOSE BLOOD VI STRP: ORAL_STRIP | 11 refills | Status: DC

## 2021-01-23 ENCOUNTER — Other Ambulatory Visit: Payer: Self-pay | Admitting: *Deleted

## 2021-01-23 DIAGNOSIS — I10 Essential (primary) hypertension: Secondary | ICD-10-CM

## 2021-01-23 MED ORDER — AMLODIPINE BESYLATE 5 MG PO TABS: ORAL_TABLET | ORAL | 1 refills | Status: DC

## 2021-01-25 ENCOUNTER — Other Ambulatory Visit: Payer: Self-pay

## 2021-01-25 ENCOUNTER — Ambulatory Visit: Payer: Medicaid Other | Admitting: Nurse Practitioner

## 2021-01-25 ENCOUNTER — Encounter: Payer: Self-pay | Admitting: Nurse Practitioner

## 2021-01-25 VITALS — BP 130/90 | HR 52 | Temp 97.9°F | Resp 17 | Wt 160.2 lb

## 2021-01-25 DIAGNOSIS — I10 Essential (primary) hypertension: Secondary | ICD-10-CM | POA: Diagnosis not present

## 2021-01-25 DIAGNOSIS — E7849 Other hyperlipidemia: Secondary | ICD-10-CM

## 2021-01-25 DIAGNOSIS — Z794 Long term (current) use of insulin: Secondary | ICD-10-CM

## 2021-01-25 DIAGNOSIS — N182 Chronic kidney disease, stage 2 (mild): Secondary | ICD-10-CM

## 2021-01-25 DIAGNOSIS — E1122 Type 2 diabetes mellitus with diabetic chronic kidney disease: Secondary | ICD-10-CM | POA: Diagnosis not present

## 2021-01-25 DIAGNOSIS — M25552 Pain in left hip: Secondary | ICD-10-CM

## 2021-01-25 DIAGNOSIS — N1831 Chronic kidney disease, stage 3a: Secondary | ICD-10-CM | POA: Diagnosis not present

## 2021-01-25 DIAGNOSIS — J4540 Moderate persistent asthma, uncomplicated: Secondary | ICD-10-CM

## 2021-01-25 MED ORDER — FLUTICASONE PROPIONATE 50 MCG/ACT NA SUSP: 2.0000 | Freq: Every day | NASAL | 6 refills | Status: DC

## 2021-01-25 MED ORDER — BUDESONIDE-FORMOTEROL FUMARATE 160-4.5 MCG/ACT IN AERO: 2.0000 | INHALATION_SPRAY | Freq: Two times a day (BID) | RESPIRATORY_TRACT | 2 refills | Status: DC

## 2021-01-25 MED ORDER — PRAVASTATIN SODIUM 20 MG PO TABS: ORAL_TABLET | ORAL | 1 refills | Status: DC

## 2021-01-25 MED ORDER — LISINOPRIL 10 MG PO TABS: 10.0000 mg | ORAL_TABLET | Freq: Every day | ORAL | 1 refills | Status: DC

## 2021-01-25 MED ORDER — GABAPENTIN 100 MG PO CAPS: 100.0000 mg | ORAL_CAPSULE | Freq: Every evening | ORAL | 1 refills | Status: DC | PRN

## 2021-01-25 MED ORDER — METOPROLOL TARTRATE 25 MG PO TABS: ORAL_TABLET | ORAL | 3 refills | Status: DC

## 2021-01-25 MED ORDER — FUROSEMIDE 20 MG PO TABS: 20.0000 mg | ORAL_TABLET | Freq: Every day | ORAL | 3 refills | Status: DC

## 2021-01-25 MED ORDER — AMLODIPINE BESYLATE 5 MG PO TABS: ORAL_TABLET | ORAL | 1 refills | Status: DC

## 2021-01-25 NOTE — Assessment & Plan Note (Signed)
Chronic.  Blood pressure is at goal today in clinic.  Continue current medications-refill sent for 6 months.  We will check kidney function with electrolytes today.  Follow-up pending blood work.

## 2021-01-25 NOTE — Assessment & Plan Note (Signed)
Chronic.  Has run out of pravastatin 20 mg daily-we will refill.  We will check lipids today-patient is fasting.  LDL goal ideally less than 70, however given age and comorbidities, less than 100 is acceptable.  Follow-up 3 months.

## 2021-01-25 NOTE — Assessment & Plan Note (Signed)
Most recent kidney function about 3 months ago showed slight increase.  If maintains at this level, will consult with nephrology.  Encouraged pushing fluids with water.  Follow-up pending blood work today.

## 2021-01-25 NOTE — Assessment & Plan Note (Signed)
Chronic.  Check A1c and kidney function today.  A1c goal less than 8% given age and comorbidities.  Want to prevent hypoglycemia-this is likely more harmful to the patient as this could result in falls.  Foot exam needs to be done at next visit.  Eye exam up-to-date.  Urine microalbumin up-to-date.  Follow-up pending blood work.

## 2021-01-25 NOTE — Assessment & Plan Note (Signed)
Chronic.  Again, we discussed her inhaler use at length.  I educated her that Symbicort is to be used as her daily inhaler.  If she is using rescue nebulizer more than 1-2 times weekly, she is making visit with [place].  Follow-up 3 months.

## 2021-01-25 NOTE — Progress Notes (Signed)
Subjective:    Patient ID: Joyce Gilmore, female    DOB: [birth year], 86 y.o.   MRN: [id number]  HPI: Joyce Gilmore is a 86 y.o. female presenting with son and interpreter for follow up.  Chief Complaint  Patient presents with   Diabetes     Follow up   Patient reports she returned from [place] in the past month.  Unfortunately, while she was in [place], she was getting up to use the bathroom in the middle the night and she fell and hit her head.  She reports her head is feeling mostly better unless she pushes on it.  She has been using Arnica cream.  She is not having pain anywhere else today from a fall.  DIABETES Hypoglycemic episodes:no Polydipsia/polyuria: no Visual disturbance: no Chest pain: no Paresthesias: no Glucose Monitoring: yes  Accucheck frequency: multiple times daily  Average today ~133 Taking Insulin?:  Yes  Long acting insulin: 38 units at bedtime  Short acting insulin: 10 units with meals depending on blood sugar Blood Pressure Monitoring: not checking Retinal Examination: Up to Date Foot Exam: Not up to Date Diabetic Education: Completed Pneumovax: Up to Date Influenza: Up to Date Aspirin: yes Statin: yes; pravastatin 20 mg daily A1c goal: Less than 8%  CHRONIC KIDNEY DISEASE stage IIIa CKD status: stable Medications renally dose: yes Previous renal evaluation: no Pneumovax:  Up to Date Influenza Vaccine:  Up to Date  HYPERTENSION / HYPERLIPIDEMIA Patient is currently taking Lopressor 25 mg half tablet twice daily, lisinopril 10 mg daily, amlodipine 5 mg daily.  She is also taking pravastatin 20 mg daily, although this has been on for some time.  As well as baby aspirin. Aspirin: yes Recent stressors: no Recurrent headaches: no Visual changes: no Palpitations: no Dyspnea: no Chest pain: no Lower extremity edema: no Dizzy/lightheaded: no Myalgias: no LDL goal: Less than 100 BP goal: Less than 140/90 The ASCVD Risk score (Arnett  DK, et al., 2019) failed to calculate for the following reasons:   The 2019 ASCVD risk score is only valid for ages 66 to 32  Patient reports asthma is well controlled.  She has been using Symbicort as needed.  She also has albuterol nebulizer machine that she uses as needed.  Patient complains of hip pain in her left hip radiating down to her knee.  She says it feels like nerve pain.  Allergies  Allergen Reactions   Bactrim [Sulfamethoxazole-Trimethoprim] Other (See Comments)    Hyperkalemia (elevated potassium)   Rifampin Other (See Comments)    Severe thrombocytopenia (low blood platelet count)   Vancomycin Other (See Comments)    Severe thrombocytopenia (low blood platelet count)    Outpatient Encounter Medications as of 01/25/2021  Medication Sig   fluticasone (FLONASE) 50 MCG/ACT nasal spray Place 2 sprays into both nostrils daily.   gabapentin (NEURONTIN) 100 MG capsule Take 1 capsule (100 mg total) by mouth at bedtime as needed (nerve pain).   Acetaminophen (TYLENOL PO) Take 1,000 tablets by mouth every 6 (six) hours as needed (pain/headache).    albuterol (PROVENTIL) (2.5 MG/3ML) 0.083% nebulizer solution Take 3 mLs (2.5 mg total) by nebulization every 6 (six) hours as needed for wheezing or shortness of breath.   albuterol (VENTOLIN HFA) 108 (90 Base) MCG/ACT inhaler Inhale 1-2 puffs into the lungs every 4 (four) hours as needed for wheezing or shortness of breath.   amLODipine (NORVASC) 5 MG tablet TAKE 1 TABLET(5 MG) BY MOUTH DAILY  aspirin (ASPIRIN 81) 81 MG EC tablet Take 1 tablet (81 mg total) by mouth daily. Swallow whole.   Blood Glucose Monitoring Suppl (BLOOD GLUCOSE SYSTEM PAK) KIT Please dispense based on patient and insurance preference.  Use as directed to monitor FSBS four times daily.  Dx E11.22   budesonide-formoterol (SYMBICORT) 160-4.5 MCG/ACT inhaler Inhale 2 puffs into the lungs 2 (two) times daily.   Calcium Carbonate-Vit D-Min (CALCIUM 1200 PO) Take by  mouth.   furosemide (LASIX) 20 MG tablet Take 1 tablet (20 mg total) by mouth daily.   glucose blood test strip Please dispense based on patient and insurance preference.  Use as directed to monitor FSBS four times daily.  Dx E11.22   insulin aspart (NOVOLOG FLEXPEN) 100 UNIT/ML FlexPen ADMINISTER 5 TO 15 UNITS UNDER THE SKIN THREE TIMES DAILY WITH MEALS PER SLIDING SCALE   insulin glargine (LANTUS SOLOSTAR) 100 UNIT/ML Solostar Pen INJECT  38 UNITS UNDER THE SKIN AT BEDTIME   Insulin Pen Needle 32G X 4 MM MISC USE AS DIRECTED TO INJECT INSULIN 4X DAILY. DX: E11.9.   Iron, Ferrous Sulfate, 325 (65 Fe) MG TABS Take 325 mg by mouth daily. TAKE IN AM WITH ORANGE JUICE.   Lancets MISC Please dispense based on patient and insurance preference.  Use as directed to monitor FSBS four times daily.  Dx E11.22   lansoprazole (PREVACID) 30 MG capsule TAKE ONE CAPSULE BY MOUTH DAILY AT 12 NOON   lisinopril (ZESTRIL) 10 MG tablet Take 1 tablet (10 mg total) by mouth daily.   loratadine (CLARITIN) 10 MG tablet TAKE 1 TABLET(10 MG) BY MOUTH DAILY AS NEEDED FOR ALLERGIES   metoprolol tartrate (LOPRESSOR) 25 MG tablet TAKE 1/2 TABLET(12.5 MG) BY MOUTH TWICE DAILY   polyvinyl alcohol (ARTIFICIAL TEARS) 1.4 % ophthalmic solution Place 1 drop into both eyes as needed for dry eyes.   pravastatin (PRAVACHOL) 20 MG tablet TAKE 1 TABLET(20 MG) BY MOUTH DAILY   sucralfate (CARAFATE) 1 g tablet TAKE 1 TABLET(1 GRAM) BY MOUTH FOUR TIMES DAILY   tizanidine (ZANAFLEX) 2 MG capsule Take 1 capsule (2 mg total) by mouth 3 (three) times daily.   traMADol (ULTRAM) 50 MG tablet Take 1-2 tablets (50-100 mg total) by mouth every 6 (six) hours as needed.   [DISCONTINUED] amLODipine (NORVASC) 5 MG tablet TAKE 1 TABLET(5 MG) BY MOUTH DAILY   [DISCONTINUED] budesonide-formoterol (SYMBICORT) 160-4.5 MCG/ACT inhaler Inhale 2 puffs into the lungs 2 (two) times daily.   [DISCONTINUED] furosemide (LASIX) 20 MG tablet Take 1 tablet (20 mg  total) by mouth daily.   [DISCONTINUED] lisinopril (ZESTRIL) 10 MG tablet Take 1 tablet (10 mg total) by mouth daily.   [DISCONTINUED] metoprolol tartrate (LOPRESSOR) 25 MG tablet TAKE 1/2 TABLET(12.5 MG) BY MOUTH TWICE DAILY   [DISCONTINUED] pravastatin (PRAVACHOL) 20 MG tablet TAKE 1 TABLET(20 MG) BY MOUTH DAILY   No facility-administered encounter medications on file as of 01/25/2021.    Patient Active Problem List   Diagnosis Date Noted   Type 2 DM with CKD stage 3 and hypertension ([place]) 07/12/2020   Chronic kidney disease 05/07/2018   GERD (gastroesophageal reflux disease) 03/10/2017   Uses hearing aid 09/12/2016   Asthmatic bronchitis 03/11/2016   Chronic diastolic heart failure ([place]) 10/12/2015   Seasonal allergies 11/21/2014   DDD (degenerative disc disease), lumbar 10/11/2014   H/O compression fracture of spine 10/11/2014   Pulmonary hypertension ([place])    Hepatic cirrhosis ([place]) 09/24/2014   SIADH (syndrome of inappropriate ADH production) ([place])  06/13/2014   Insomnia 12/28/2013   Anemia 07/06/2013   HLD (hyperlipidemia) 06/27/2013   Osteoporosis 07/26/2012   Hyponatremia 05/16/2011   Type 2 diabetes mellitus with diabetic chronic kidney disease ([place]) 08/17/2006   Essential hypertension 08/17/2006   DIVERTICULOSIS, COLON 08/17/2006    Past Medical History:  Diagnosis Date   Arthritis    CHF (congestive heart failure) (HCC)    diastolic   Chronic kidney disease    hx of kidney stones,    Depression    hx of    Diabetes mellitus without complication (H[facility])    Ganglion, left ankle and foot    Hearing loss of both ears    Hypertension    MRSA infection    Oct 13 - Nov 13   Osteoporosis    Tinnitus of both ears     Relevant past medical, surgical, family and social history reviewed and updated as indicated. Interim medical history since our last visit reviewed.  Review of Systems Per HPI unless specifically indicated above     Objective:    BP 130/90    Pulse (!) 52   Temp 97.9 F (36.6 C)   Resp 17   Wt 160 lb 3.2 oz (72.7 kg)   SpO2 95%   BMI 35.92 kg/m   Wt Readings from Last 3 Encounters:  01/25/21 160 lb 3.2 oz (72.7 kg)  11/07/20 161 lb 6.4 oz (73.2 kg)  10/10/20 156 lb (70.8 kg)    Physical Exam Vitals and nursing note reviewed.  Constitutional:      General: She is not in acute distress.    Appearance: Normal appearance. She is obese. She is not toxic-appearing.  HENT:     Right Ear: A middle ear effusion is present. There is no impacted cerumen. Tympanic membrane is not injected, erythematous or retracted.     Left Ear: Tympanic membrane, ear canal and external ear normal.  Eyes:     General: No scleral icterus.    Extraocular Movements: Extraocular movements intact.  Neck:     Vascular: No carotid bruit.  Cardiovascular:     Rate and Rhythm: Normal rate and regular rhythm.     Heart sounds: Normal heart sounds. No murmur heard. Pulmonary:     Effort: Pulmonary effort is normal. No respiratory distress.     Breath sounds: Normal breath sounds. No wheezing, rhonchi or rales.  Musculoskeletal:     Cervical back: Normal range of motion. No tenderness.     Right lower leg: No edema.     Left lower leg: No edema.  Skin:    General: Skin is warm and dry.     Capillary Refill: Capillary refill takes less than 2 seconds.     Coloration: Skin is not jaundiced or pale.     Findings: No erythema.  Neurological:     Mental Status: She is alert and oriented to person, place, and time.     Gait: Gait abnormal (Walks with cane).  Psychiatric:        Mood and Affect: Mood normal.        Behavior: Behavior normal.        Thought Content: Thought content normal.        Judgment: Judgment normal.      Assessment & Plan:   Problem List Items Addressed This Visit       Cardiovascular and Mediastinum   Essential hypertension    Chronic.  Blood pressure is at goal today in clinic.  Continue current medications-refill sent  for 6 months.  We will check kidney function with electrolytes today.  Follow-up pending blood work.      Relevant Medications   pravastatin (PRAVACHOL) 20 MG tablet   metoprolol tartrate (LOPRESSOR) 25 MG tablet   lisinopril (ZESTRIL) 10 MG tablet   amLODipine (NORVASC) 5 MG tablet   furosemide (LASIX) 20 MG tablet   Other Relevant Orders   BASIC METABOLIC PANEL WITH GFR     Respiratory   Asthmatic bronchitis    Chronic.  Again, we discussed her inhaler use at length.  I educated her that Symbicort is to be used as her daily inhaler.  If she is using rescue nebulizer more than 1-2 times weekly, she is making visit with [place].  Follow-up 3 months.      Relevant Medications   budesonide-formoterol (SYMBICORT) 160-4.5 MCG/ACT inhaler   fluticasone (FLONASE) 50 MCG/ACT nasal spray     Endocrine   Type 2 diabetes mellitus with diabetic chronic kidney disease ([place]) - Primary    Chronic.  Check A1c and kidney function today.  A1c goal less than 8% given age and comorbidities.  Want to prevent hypoglycemia-this is likely more harmful to the patient as this could result in falls.  Foot exam needs to be done at next visit.  Eye exam up-to-date.  Urine microalbumin up-to-date.  Follow-up pending blood work.      Relevant Medications   pravastatin (PRAVACHOL) 20 MG tablet   lisinopril (ZESTRIL) 10 MG tablet   Other Relevant Orders   Hemoglobin A1c     Genitourinary   Chronic kidney disease    Most recent kidney function about 3 months ago showed slight increase.  If maintains at this level, will consult with nephrology.  Encouraged pushing fluids with water.  Follow-up pending blood work today.        Other   HLD (hyperlipidemia)    Chronic.  Has run out of pravastatin 20 mg daily-we will refill.  We will check lipids today-patient is fasting.  LDL goal ideally less than 70, however given age and comorbidities, less than 100 is acceptable.  Follow-up 3 months.      Relevant Medications    pravastatin (PRAVACHOL) 20 MG tablet   metoprolol tartrate (LOPRESSOR) 25 MG tablet   lisinopril (ZESTRIL) 10 MG tablet   amLODipine (NORVASC) 5 MG tablet   furosemide (LASIX) 20 MG tablet   Other Visit Diagnoses     Left hip pain       Acute.  Suspect arthritis, however may also be component of neuropathy.  Start gabapentin 100 mg daily.  Return to clinic if this does not help.   Relevant Medications   gabapentin (NEURONTIN) 100 MG capsule        Follow up plan: Return in about 3 months (around 04/25/2021) for hip pain follow up.

## 2021-01-26 LAB — BASIC METABOLIC PANEL WITH GFR
BUN/Creatinine Ratio: 24 (calc) — ABNORMAL HIGH (ref 6–22)
BUN: 25 mg/dL (ref 7–25)
CO2: 27 mmol/L (ref 20–32)
Calcium: 9.3 mg/dL (ref 8.6–10.4)
Chloride: 100 mmol/L (ref 98–110)
Creat: 1.03 mg/dL — ABNORMAL HIGH (ref 0.60–0.95)
Glucose, Bld: 54 mg/dL — ABNORMAL LOW (ref 65–99)
Potassium: 4.4 mmol/L (ref 3.5–5.3)
Sodium: 134 mmol/L — ABNORMAL LOW (ref 135–146)
eGFR: 54 mL/min/{1.73_m2} — ABNORMAL LOW (ref >=60)

## 2021-01-26 LAB — HEMOGLOBIN A1C
Hgb A1c MFr Bld: 7.6 % of total Hgb — ABNORMAL HIGH (ref <5.7)
Mean Plasma Glucose: 171 mg/dL
eAG (mmol/L): 9.5 mmol/L

## 2021-01-28 MED ORDER — GLUCOSE BLOOD VI STRP: ORAL_STRIP | 11 refills | Status: AC

## 2021-01-28 MED ORDER — LANCETS MISC: 99 refills | Status: AC

## 2021-01-28 NOTE — Addendum Note (Signed)
Addended by: Noemi Chapel A on: 01/28/2021 07:57 AM   Modules accepted: Orders

## 2021-02-14 ENCOUNTER — Telehealth: Payer: Self-pay

## 2021-02-14 ENCOUNTER — Emergency Department (HOSPITAL_COMMUNITY): Payer: Medicaid Other

## 2021-02-14 ENCOUNTER — Other Ambulatory Visit: Payer: Self-pay

## 2021-02-14 ENCOUNTER — Emergency Department (HOSPITAL_COMMUNITY)
Admission: EM | Admit: 2021-02-14 | Discharge: 2021-02-15 | Disposition: A | Discharge status: Home / Self Care | Payer: Medicaid Other | Attending: Emergency Medicine | Admitting: Emergency Medicine

## 2021-02-14 DIAGNOSIS — R079 Chest pain, unspecified: Secondary | ICD-10-CM | POA: Diagnosis not present

## 2021-02-14 DIAGNOSIS — Z96653 Presence of artificial knee joint, bilateral: Secondary | ICD-10-CM | POA: Diagnosis not present

## 2021-02-14 DIAGNOSIS — R0789 Other chest pain: Secondary | ICD-10-CM

## 2021-02-14 DIAGNOSIS — Z96641 Presence of right artificial hip joint: Secondary | ICD-10-CM | POA: Diagnosis not present

## 2021-02-14 DIAGNOSIS — J45909 Unspecified asthma, uncomplicated: Secondary | ICD-10-CM | POA: Diagnosis not present

## 2021-02-14 DIAGNOSIS — N183 Chronic kidney disease, stage 3 unspecified: Secondary | ICD-10-CM | POA: Insufficient documentation

## 2021-02-14 DIAGNOSIS — I5032 Chronic diastolic (congestive) heart failure: Secondary | ICD-10-CM | POA: Diagnosis not present

## 2021-02-14 DIAGNOSIS — Z7951 Long term (current) use of inhaled steroids: Secondary | ICD-10-CM | POA: Insufficient documentation

## 2021-02-14 DIAGNOSIS — Z7982 Long term (current) use of aspirin: Secondary | ICD-10-CM | POA: Insufficient documentation

## 2021-02-14 DIAGNOSIS — E1122 Type 2 diabetes mellitus with diabetic chronic kidney disease: Secondary | ICD-10-CM | POA: Insufficient documentation

## 2021-02-14 DIAGNOSIS — Z79899 Other long term (current) drug therapy: Secondary | ICD-10-CM | POA: Insufficient documentation

## 2021-02-14 DIAGNOSIS — E1165 Type 2 diabetes mellitus with hyperglycemia: Secondary | ICD-10-CM | POA: Diagnosis not present

## 2021-02-14 DIAGNOSIS — M549 Dorsalgia, unspecified: Secondary | ICD-10-CM | POA: Diagnosis not present

## 2021-02-14 DIAGNOSIS — J9811 Atelectasis: Secondary | ICD-10-CM | POA: Diagnosis not present

## 2021-02-14 DIAGNOSIS — R0902 Hypoxemia: Secondary | ICD-10-CM | POA: Diagnosis not present

## 2021-02-14 DIAGNOSIS — I959 Hypotension, unspecified: Secondary | ICD-10-CM | POA: Diagnosis not present

## 2021-02-14 DIAGNOSIS — I130 Hypertensive heart and chronic kidney disease with heart failure and stage 1 through stage 4 chronic kidney disease, or unspecified chronic kidney disease: Secondary | ICD-10-CM | POA: Diagnosis not present

## 2021-02-14 DIAGNOSIS — Z794 Long term (current) use of insulin: Secondary | ICD-10-CM | POA: Insufficient documentation

## 2021-02-14 DIAGNOSIS — R739 Hyperglycemia, unspecified: Secondary | ICD-10-CM

## 2021-02-14 NOTE — Telephone Encounter (Signed)
She should be taking sliding scale insulin at mealtimes and long acting insulin at bedtime.  Please make sure she is checking blood sugar prior to taking insulin and she eats something high in protein to help prevent very highs and lows.

## 2021-02-14 NOTE — ED Triage Notes (Signed)
BIB [facility] from home c/o mid-sternum CP and ShOB all day now with back/shoulder pain. Pt believes it could be due to her "sugars". Hx of diabetes, BG-165 via EMS.

## 2021-02-14 NOTE — Telephone Encounter (Signed)
Spoke with Kathlee Nations, granddaughter, they have not been using a sliding scale. They have been giving the pt 6U of Novolog TID w/meals. They have not been checking her BG prior to administration. They have been using the Lantus as directed.  Marrianne Mood to monitor pt's BG and if extremely high or pt shows s/s of hyperglycemia she should seek urgent medical evaluation in UC or ED. She voiced understanding.  Please advise on exact sliding scale for insulin. Thank you!

## 2021-02-14 NOTE — Telephone Encounter (Signed)
Pt's granddaughter called in about pt sugar readings for today stating that the pt says she just doesn't feel right. Granddaughter states that pt checked her sugars today at breakfast it was 184 and then at lunch it was 63. After a recheck it was 191. Granddaughter states that pt says she feels shaky and dizzy. Please advise.  Cb#: [phone]

## 2021-02-14 NOTE — ED Notes (Signed)
Patient transported to X-ray 

## 2021-02-14 NOTE — Telephone Encounter (Signed)
Please advise, thanks.

## 2021-02-14 NOTE — ED Provider Notes (Signed)
[facility] EMERGENCY DEPARTMENT Provider Note   CSN: [id number] Arrival date & time: 02/14/21  2215     History Chief Complaint  Patient presents with   Shortness of Breath   Chest Pain    Joyce Gilmore is a 86 y.o. female.  Patient is an 86 year old female with past medical history of congestive heart failure, chronic renal insufficiency, diabetes, hypertension.  Patient presenting today for evaluation of labile blood sugars as well as discomfort in her chest.  This has been ongoing for several days.  She describes pressure to the center of her chest that comes and goes.  She denies any nausea, diaphoresis, or radiation.  Her blood sugars have been as high as 200.  Patient with no prior cardiac history.  History taken with the use of the translator tablet as patient speaks minimal [place], mainly [place].  The history is provided by the patient.      Past Medical History:  Diagnosis Date   Arthritis    CHF (congestive heart failure) (HCC)    diastolic   Chronic kidney disease    hx of kidney stones,    Depression    hx of    Diabetes mellitus without complication (HCC)    Ganglion, left ankle and foot    Hearing loss of both ears    Hypertension    MRSA infection    Oct 13 - Nov 13   Osteoporosis    Tinnitus of both ears     Patient Active Problem List   Diagnosis Date Noted   Type 2 DM with CKD stage 3 and hypertension ([place]) 07/12/2020   Chronic kidney disease 05/07/2018   GERD (gastroesophageal reflux disease) 03/10/2017   Uses hearing aid 09/12/2016   Asthmatic bronchitis 03/11/2016   Chronic diastolic heart failure ([place]) 10/12/2015   Seasonal allergies 11/21/2014   DDD (degenerative disc disease), lumbar 10/11/2014   H/O compression fracture of spine 10/11/2014   Pulmonary hypertension ([place])    Hepatic cirrhosis ([place]) 09/24/2014   SIADH (syndrome of inappropriate ADH production) ([place]) 06/13/2014   Insomnia 12/28/2013   Anemia  07/06/2013   HLD (hyperlipidemia) 06/27/2013   Osteoporosis 07/26/2012   Hyponatremia 05/16/2011   Type 2 diabetes mellitus with diabetic chronic kidney disease ([place]) 08/17/2006   Essential hypertension 08/17/2006   DIVERTICULOSIS, COLON 08/17/2006    Past Surgical History:  Procedure Laterality Date   INCISION AND DRAINAGE HIP  12/22/2011   Procedure: IRRIGATION AND DEBRIDEMENT HIP;  Surgeon: Mcarthur Rossetti, MD;  Location: [place];  Service: Orthopedics;  Laterality: Right;  Irrigation and debridement right hip   INCISION AND DRAINAGE HIP  12/27/2011   Procedure: IRRIGATION AND DEBRIDEMENT HIP;  Surgeon: Mcarthur Rossetti, MD;  Location: [place];  Service: Orthopedics;  Laterality: Right;  Repeat irrigation and debridement   JOINT REPLACEMENT     bilateral knee, right hip    MASS EXCISION N/A 01/03/2020   Procedure: EXCISION OF HEMANGIOMA OF THE PALATE;  Surgeon: Leta Baptist, MD;  Location: [place];  Service: ENT;  Laterality: N/A;   TOTAL HIP REVISION  12/05/2011   Procedure: TOTAL HIP REVISION;  Surgeon: Mcarthur Rossetti, MD;  Location: [facility];  Service: Orthopedics;  Laterality: Right;  Right Hip Revision Arthroplasty to Total Hip, Excision of Old Implant     OB History   No obstetric history on file.     Family History  Problem Relation Age of Onset   Hypertension  Mother    Colon cancer Neg Hx    Breast cancer Neg Hx     Social History   Tobacco Use   Smoking status: Never   Smokeless tobacco: Never  Vaping Use   Vaping Use: Never used  Substance Use Topics   Alcohol use: No    Alcohol/week: 0.0 standard drinks   Drug use: No    Home Medications Prior to Admission medications   Medication Sig Start Date End Date Taking? Authorizing Provider  Acetaminophen (TYLENOL PO) Take 1,000 tablets by mouth every 6 (six) hours as needed (pain/headache).     [provider]  albuterol (PROVENTIL) (2.5 MG/3ML) 0.083% nebulizer solution  Take 3 mLs (2.5 mg total) by nebulization every 6 (six) hours as needed for wheezing or shortness of breath. 08/30/20   Eulogio Bear, NP  albuterol (VENTOLIN HFA) 108 (90 Base) MCG/ACT inhaler Inhale 1-2 puffs into the lungs every 4 (four) hours as needed for wheezing or shortness of breath. 08/30/20   Eulogio Bear, NP  amLODipine (NORVASC) 5 MG tablet TAKE 1 TABLET(5 MG) BY MOUTH DAILY 01/25/21   Eulogio Bear, NP  aspirin (ASPIRIN 81) 81 MG EC tablet Take 1 tablet (81 mg total) by mouth daily. Swallow whole. 01/09/20   Alycia Rossetti, MD  Blood Glucose Monitoring Suppl (BLOOD GLUCOSE SYSTEM PAK) KIT Please dispense based on patient and insurance preference.  Use as directed to monitor FSBS four times daily.  Dx E11.22 07/12/20   Eulogio Bear, NP  budesonide-formoterol [facility]) 160-4.5 MCG/ACT inhaler Inhale 2 puffs into the lungs 2 (two) times daily. 01/25/21   Eulogio Bear, NP  Calcium Carbonate-Vit D-Min (CALCIUM 1200 PO) Take by mouth.    [provider]  fluticasone (FLONASE) 50 MCG/ACT nasal spray Place 2 sprays into both nostrils daily. 01/25/21   Eulogio Bear, NP  furosemide (LASIX) 20 MG tablet Take 1 tablet (20 mg total) by mouth daily. 01/25/21   Eulogio Bear, NP  gabapentin (NEURONTIN) 100 MG capsule Take 1 capsule (100 mg total) by mouth at bedtime as needed (nerve pain). 01/25/21   Eulogio Bear, NP  glucose blood test strip Please dispense based on patient and insurance preference.  Use as directed to monitor FSBS four times daily.  Dx E11.22 01/28/21   Eulogio Bear, NP  insulin aspart (NOVOLOG FLEXPEN) 100 UNIT/ML FlexPen ADMINISTER 5 TO 15 UNITS UNDER THE SKIN THREE TIMES DAILY WITH MEALS PER SLIDING SCALE 11/07/20   Eulogio Bear, NP  insulin glargine (LANTUS SOLOSTAR) 100 UNIT/ML Solostar Pen INJECT  38 UNITS UNDER THE SKIN AT BEDTIME 11/07/20   Noemi Chapel A, NP  Insulin Pen Needle 32G X 4 MM MISC USE AS  DIRECTED TO INJECT INSULIN 4X DAILY. DX: E11.9. 11/07/20   Eulogio Bear, NP  Iron, Ferrous Sulfate, 325 (65 Fe) MG TABS Take 325 mg by mouth daily. TAKE IN AM WITH ORANGE JUICE. 10/10/20   Eulogio Bear, NP  Lancets MISC Please dispense based on patient and insurance preference.  Use as directed to monitor FSBS four times daily.  Dx E11.22 01/28/21   Eulogio Bear, NP  lansoprazole (PREVACID) 30 MG capsule TAKE ONE CAPSULE BY MOUTH DAILY AT 12 NOON 11/07/20   Eulogio Bear, NP  lisinopril (ZESTRIL) 10 MG tablet Take 1 tablet (10 mg total) by mouth daily. 01/25/21   Eulogio Bear, NP  loratadine (CLARITIN) 10 MG tablet TAKE 1 TABLET(10 MG)  BY MOUTH DAILY AS NEEDED FOR ALLERGIES 10/10/20   Eulogio Bear, NP  metoprolol tartrate (LOPRESSOR) 25 MG tablet TAKE 1/2 TABLET(12.5 MG) BY MOUTH TWICE DAILY 01/25/21   Noemi Chapel A, NP  polyvinyl alcohol (ARTIFICIAL TEARS) 1.4 % ophthalmic solution Place 1 drop into both eyes as needed for dry eyes. 10/12/17   Alycia Rossetti, MD  pravastatin (PRAVACHOL) 20 MG tablet TAKE 1 TABLET(20 MG) BY MOUTH DAILY 01/25/21   Noemi Chapel A, NP  sucralfate (CARAFATE) 1 g tablet TAKE 1 TABLET(1 GRAM) BY MOUTH FOUR TIMES DAILY 11/07/20   Eulogio Bear, NP  tizanidine (ZANAFLEX) 2 MG capsule Take 1 capsule (2 mg total) by mouth 3 (three) times daily. 08/30/20   Eulogio Bear, NP  traMADol (ULTRAM) 50 MG tablet Take 1-2 tablets (50-100 mg total) by mouth every 6 (six) hours as needed. 06/11/20   Eulogio Bear, NP    Allergies    Bactrim [sulfamethoxazole-trimethoprim], Rifampin, and Vancomycin  Review of Systems   Review of Systems  All other systems reviewed and are negative.  Physical Exam Updated Vital Signs BP (!) 117/54    Pulse 63    Temp 98.1 F (36.7 C) (Oral)    Resp 20    Ht 5' (1.524 m)    Wt 74.8 kg    SpO2 96%    BMI 32.22 kg/m   Physical Exam Vitals and nursing note reviewed.  Constitutional:       General: She is not in acute distress.    Appearance: She is well-developed. She is not diaphoretic.  HENT:     Head: Normocephalic and atraumatic.  Cardiovascular:     Rate and Rhythm: Normal rate and regular rhythm.     Heart sounds: No murmur heard.   No friction rub. No gallop.  Pulmonary:     Effort: Pulmonary effort is normal. No respiratory distress.     Breath sounds: Normal breath sounds. No wheezing.  Abdominal:     General: Bowel sounds are normal. There is no distension.     Palpations: Abdomen is soft.     Tenderness: There is no abdominal tenderness.  Musculoskeletal:        General: Normal range of motion.     Cervical back: Normal range of motion and neck supple.     Right lower leg: No tenderness. No edema.     Left lower leg: No tenderness. No edema.  Skin:    General: Skin is warm and dry.  Neurological:     General: No focal deficit present.     Mental Status: She is alert and oriented to person, place, and time.    ED Results / Procedures / Treatments   Labs (all labs ordered are listed, but only abnormal results are displayed) Labs Reviewed  BASIC METABOLIC PANEL  CBC  TROPONIN I (HIGH SENSITIVITY)    EKG EKG Interpretation  Date/Time:  Thursday February 14 2021 22:57:41 EST Ventricular Rate:  64 PR Interval:  160 QRS Duration: 98 QT Interval:  379 QTC Calculation: 391 R Axis:   22 Text Interpretation: Sinus rhythm Probable lateral infarct, old No significant change since 09/07/2020 Confirmed by Veryl Speak [phone]) on 02/14/2021 11:46:57 PM  Radiology DG Chest 2 View  Result Date: 02/14/2021 CLINICAL DATA:  Chest pain. EXAM: CHEST - 2 VIEW COMPARISON:  Chest x-ray 09/07/2020. FINDINGS: Lung volumes are low. There is bibasilar atelectasis. There is no definite pleural effusion or pneumothorax. The cardiomediastinal silhouette is  within normal limits for projection. No acute fractures are seen. IMPRESSION: Low lung volumes.  No active  cardiopulmonary disease. Electronically Signed   By: Ronney Asters M.D.   On: 02/14/2021 23:50    Procedures Procedures   Medications Ordered in ED Medications - No data to display  ED Course  I have reviewed the triage vital signs and the nursing notes.  Pertinent labs & imaging results that were available during my care of the patient were reviewed by me and considered in my medical decision making (see chart for details).    MDM Rules/Calculators/A&P  Patient presenting here with complaints of intermittent chest discomfort for the past several days along with erratic blood sugars.  Patient's cardiac work-up is unremarkable including unchanged EKG and troponin x2.  She has stable vital signs with no hypoxia or tachycardia and I doubt PE.  She also has concerns about her blood sugar, however here it is 105 with no evidence of electrolyte disturbance.  At this point, I see no indication for patient to be admitted.  I feel as though she can safely be discharged with outpatient follow-up and return as needed if symptoms worsen.  Final Clinical Impression(s) / ED Diagnoses Final diagnoses:  None    Rx / DC Orders ED Discharge Orders     None        Veryl Speak, MD 02/15/21 [phone]

## 2021-02-14 NOTE — Telephone Encounter (Signed)
She needs to check CBG before taking insulin.  She can continue what she is doing - 6 units with meals.  Only take insulin if she eats a meal.  If her blood sugar is less than 70 prior to meal, do not give insulin at all.

## 2021-02-15 LAB — TROPONIN I (HIGH SENSITIVITY)
Troponin I (High Sensitivity): 8 ng/L (ref <18)
Troponin I (High Sensitivity): 10 ng/L (ref <18)

## 2021-02-15 LAB — BASIC METABOLIC PANEL
Anion gap: 10 (ref 5–15)
BUN: 20 mg/dL (ref 8–23)
CO2: 25 mmol/L (ref 22–32)
Calcium: 9.1 mg/dL (ref 8.9–10.3)
Chloride: 95 mmol/L — ABNORMAL LOW (ref 98–111)
Creatinine, Ser: 0.93 mg/dL (ref 0.44–1.00)
GFR, Estimated: >60 mL/min (ref >60)
Glucose, Bld: 106 mg/dL — ABNORMAL HIGH (ref 70–99)
Potassium: 4.5 mmol/L (ref 3.5–5.1)
Sodium: 130 mmol/L — ABNORMAL LOW (ref 135–145)

## 2021-02-15 LAB — CBC
HCT: 44 % (ref 36.0–46.0)
Hemoglobin: 14.6 g/dL (ref 12.0–15.0)
MCH: 28.5 pg (ref 26.0–34.0)
MCHC: 33.2 g/dL (ref 30.0–36.0)
MCV: 85.9 fL (ref 80.0–100.0)
Platelets: 194 10*3/uL (ref 150–400)
RBC: 5.12 MIL/uL — ABNORMAL HIGH (ref 3.87–5.11)
RDW: 14.3 % (ref 11.5–15.5)
WBC: 8.1 10*3/uL (ref 4.0–10.5)
nRBC: 0 % (ref 0.0–0.2)

## 2021-02-15 NOTE — Telephone Encounter (Signed)
Spoke with pt's granddaughter, Kathlee Nations, and advised. She took notes about insulin and readings. She voiced understanding about instructions on checking BG, giving insulin and meals. She does report pt was seen in ED last night, was not admitted. She has scheduled f/u for next week. Nothing further needed at this time.

## 2021-02-15 NOTE — Discharge Instructions (Signed)
Continue medications as previously prescribed.  Follow-up with your primary doctor next week, and return to the ER if you develop worsening pain, high fevers, difficulty breathing, or other new and concerning symptoms.

## 2021-02-21 ENCOUNTER — Ambulatory Visit: Payer: Medicaid Other | Admitting: Nurse Practitioner

## 2021-02-21 ENCOUNTER — Other Ambulatory Visit: Payer: Self-pay

## 2021-02-21 ENCOUNTER — Encounter: Payer: Self-pay | Admitting: Nurse Practitioner

## 2021-02-21 VITALS — BP 116/60 | HR 65 | Temp 98.5°F | Resp 17 | Ht <= 58 in | Wt 163.0 lb

## 2021-02-21 DIAGNOSIS — E1122 Type 2 diabetes mellitus with diabetic chronic kidney disease: Secondary | ICD-10-CM | POA: Diagnosis not present

## 2021-02-21 DIAGNOSIS — Z23 Encounter for immunization: Secondary | ICD-10-CM | POA: Diagnosis not present

## 2021-02-21 DIAGNOSIS — F430 Acute stress reaction: Secondary | ICD-10-CM | POA: Diagnosis not present

## 2021-02-21 DIAGNOSIS — I129 Hypertensive chronic kidney disease with stage 1 through stage 4 chronic kidney disease, or unspecified chronic kidney disease: Secondary | ICD-10-CM | POA: Diagnosis not present

## 2021-02-21 DIAGNOSIS — N183 Chronic kidney disease, stage 3 unspecified: Secondary | ICD-10-CM

## 2021-02-21 NOTE — Progress Notes (Signed)
Subjective:    Patient ID: Joyce Gilmore, female    DOB: 03-02-1934, 86 y.o.   MRN: [id number]  HPI: [name] is a 86 y.o. female presenting with son, grandson, and interpreter, Link Snuffer, for follow up.  Chief Complaint  Patient presents with   Follow-up    Follow up from hospital chest pains and headache Breathing problems    ER FOLLOW UP Time since discharge: 7 days  Hospital/facility: [facility]  Diagnosis: atypical chest pain, hyperglycemia  Procedures/tests:  EKG, troponin, chest x-ray  Consultants: none  New medications: none  Discharge instructions:    Status: better  Patient reports she is feeling all of the way better.  She is not having anymore chest pain.  She reports the wheezing has gotten better.  She reports increased stress around holidays because she cannot easily travel to see family in [place].  It sounds like she recently got over a viral infection.    She reports her blood sugars are back to her normal now; usually 110s during the day.    Son reports some issues with short term memory.  Reports she used to be able to carry on a conversation for a long time and now has to stop and pause more to remember conversation.  She sometimes forgets when they tell her things.  She also has been complaining of being stuck in the house more.  Allergies  Allergen Reactions   Bactrim [Sulfamethoxazole-Trimethoprim] Other (See Comments)    Hyperkalemia (elevated potassium)   Rifampin Other (See Comments)    Severe thrombocytopenia (low blood platelet count)   Vancomycin Other (See Comments)    Severe thrombocytopenia (low blood platelet count)    Outpatient Encounter Medications as of 02/21/2021  Medication Sig   albuterol (PROVENTIL) (2.5 MG/3ML) 0.083% nebulizer solution Take 3 mLs (2.5 mg total) by nebulization every 6 (six) hours as needed for wheezing or shortness of breath.   albuterol (VENTOLIN HFA) 108 (90 Base) MCG/ACT  inhaler Inhale 1-2 puffs into the lungs every 4 (four) hours as needed for wheezing or shortness of breath.   amLODipine (NORVASC) 5 MG tablet TAKE 1 TABLET(5 MG) BY MOUTH DAILY   aspirin (ASPIRIN 81) 81 MG EC tablet Take 1 tablet (81 mg total) by mouth daily. Swallow whole.   Blood Glucose Monitoring Suppl (BLOOD GLUCOSE SYSTEM PAK) KIT Please dispense based on patient and insurance preference.  Use as directed to monitor FSBS four times daily.  Dx E11.22   budesonide-formoterol (SYMBICORT) 160-4.5 MCG/ACT inhaler Inhale 2 puffs into the lungs 2 (two) times daily.   Calcium Carbonate-Vit D-Min (CALCIUM 1200 PO) Take by mouth.   fluticasone (FLONASE) 50 MCG/ACT nasal spray Place 2 sprays into both nostrils daily.   furosemide (LASIX) 20 MG tablet Take 1 tablet (20 mg total) by mouth daily.   gabapentin (NEURONTIN) 100 MG capsule Take 1 capsule (100 mg total) by mouth at bedtime as needed (nerve pain).   glucose blood test strip Please dispense based on patient and insurance preference.  Use as directed to monitor FSBS four times daily.  Dx E11.22   insulin aspart (NOVOLOG FLEXPEN) 100 UNIT/ML FlexPen ADMINISTER 5 TO 15 UNITS UNDER THE SKIN THREE TIMES DAILY WITH MEALS PER SLIDING SCALE   insulin glargine (LANTUS SOLOSTAR) 100 UNIT/ML Solostar Pen INJECT  38 UNITS UNDER THE SKIN AT BEDTIME   Insulin Pen Needle 32G X 4 MM MISC USE AS DIRECTED TO INJECT INSULIN 4X DAILY.  DX: E11.9.   Iron, Ferrous Sulfate, 325 (65 Fe) MG TABS Take 325 mg by mouth daily. TAKE IN AM WITH ORANGE JUICE.   Lancets MISC Please dispense based on patient and insurance preference.  Use as directed to monitor FSBS four times daily.  Dx E11.22   lansoprazole (PREVACID) 30 MG capsule TAKE ONE CAPSULE BY MOUTH DAILY AT 12 NOON   lisinopril (ZESTRIL) 10 MG tablet Take 1 tablet (10 mg total) by mouth daily.   loratadine (CLARITIN) 10 MG tablet TAKE 1 TABLET(10 MG) BY MOUTH DAILY AS NEEDED FOR ALLERGIES   metoprolol tartrate  (LOPRESSOR) 25 MG tablet TAKE 1/2 TABLET(12.5 MG) BY MOUTH TWICE DAILY   polyvinyl alcohol (ARTIFICIAL TEARS) 1.4 % ophthalmic solution Place 1 drop into both eyes as needed for dry eyes.   pravastatin (PRAVACHOL) 20 MG tablet TAKE 1 TABLET(20 MG) BY MOUTH DAILY   sucralfate (CARAFATE) 1 g tablet TAKE 1 TABLET(1 GRAM) BY MOUTH FOUR TIMES DAILY   tizanidine (ZANAFLEX) 2 MG capsule Take 1 capsule (2 mg total) by mouth 3 (three) times daily.   traMADol (ULTRAM) 50 MG tablet Take 1-2 tablets (50-100 mg total) by mouth every 6 (six) hours as needed.   Acetaminophen (TYLENOL PO) Take 1,000 tablets by mouth every 6 (six) hours as needed (pain/headache).  (Patient not taking: Reported on 02/21/2021)   No facility-administered encounter medications on file as of 02/21/2021.    Patient Active Problem List   Diagnosis Date Noted   Type 2 DM with CKD stage 3 and hypertension ([place]) 07/12/2020   Chronic kidney disease 05/07/2018   GERD (gastroesophageal reflux disease) 03/10/2017   Uses hearing aid 09/12/2016   Asthmatic bronchitis 03/11/2016   Chronic diastolic heart failure ([place]) 10/12/2015   Seasonal allergies 11/21/2014   DDD (degenerative disc disease), lumbar 10/11/2014   H/O compression fracture of spine 10/11/2014   Pulmonary hypertension ([place])    Hepatic cirrhosis ([place]) 09/24/2014   SIADH (syndrome of inappropriate ADH production) ([place]) 06/13/2014   Insomnia 12/28/2013   Anemia 07/06/2013   HLD (hyperlipidemia) 06/27/2013   Osteoporosis 07/26/2012   Hyponatremia 05/16/2011   Type 2 diabetes mellitus with diabetic chronic kidney disease ([place]) 08/17/2006   Essential hypertension 08/17/2006   DIVERTICULOSIS, COLON 08/17/2006    Past Medical History:  Diagnosis Date   Arthritis    CHF (congestive heart failure) (HCC)    diastolic   Chronic kidney disease    hx of kidney stones,    Depression    hx of    Diabetes mellitus without complication (HCC)    Ganglion, left ankle and foot     Hearing loss of both ears    Hypertension    MRSA infection    Oct 13 - Nov 13   Osteoporosis    Tinnitus of both ears     Relevant past medical, surgical, family and social history reviewed and updated as indicated. Interim medical history since our last visit reviewed.  Review of Systems  Constitutional: Negative.  Negative for activity change, appetite change, fatigue and fever.  HENT: Negative.  Negative for congestion, ear discharge, ear pain, postnasal drip, rhinorrhea, sinus pressure, sinus pain, sneezing and sore throat.   Eyes: Negative.   Respiratory: Negative.  Negative for cough, chest tightness, shortness of breath and wheezing.   Cardiovascular: Negative.  Negative for chest pain, palpitations and leg swelling.  Gastrointestinal: Negative.   Endocrine: Negative.   Skin: Negative.   Neurological: Negative.  Negative for dizziness, light-headedness and  headaches.  Psychiatric/Behavioral: Negative.    Per HPI unless specifically indicated above     Objective:    BP 116/60 (BP Location: Left Arm, Patient Position: Sitting, Cuff Size: Normal)    Pulse 65    Temp 98.5 F (36.9 C) (Temporal)    Resp 17    Ht $R'4\' 10"'Dx$  (1.473 m)    Wt 163 lb (73.9 kg)    SpO2 95%    BMI 34.07 kg/m   Wt Readings from Last 3 Encounters:  02/21/21 163 lb (73.9 kg)  02/14/21 165 lb (74.8 kg)  01/25/21 160 lb 3.2 oz (72.7 kg)    Physical Exam Vitals and nursing note reviewed.  Constitutional:      General: She is not in acute distress.    Appearance: Normal appearance. She is obese. She is not toxic-appearing.  Cardiovascular:     Rate and Rhythm: Normal rate and regular rhythm.     Heart sounds: Normal heart sounds. No murmur heard. Pulmonary:     Effort: Pulmonary effort is normal. No respiratory distress.     Breath sounds: Normal breath sounds. No wheezing, rhonchi or rales.  Musculoskeletal:        General: Normal range of motion.     Right lower leg: No edema.     Left lower  leg: No edema.  Skin:    General: Skin is warm and dry.     Coloration: Skin is not jaundiced or pale.     Findings: No erythema.  Neurological:     Mental Status: She is alert and oriented to person, place, and time.     Motor: No weakness.     Gait: Gait abnormal (walks with cane).  Psychiatric:        Mood and Affect: Mood normal.        Behavior: Behavior normal.        Thought Content: Thought content normal.        Judgment: Judgment normal.      Assessment & Plan:   Problem List Items Addressed This Visit       Cardiovascular and Mediastinum   Type 2 DM with CKD stage 3 and hypertension ([place]) - Primary    Chronic.  Blood sugar readings are reportedly around her normal.  Continue current regimen, follow up in 2 months.  She agrees to flu shot today.        Other Visit Diagnoses     Stress reaction       Suspect acute viral illness with secondary stress likely caused chest pain/labile blood sugars.  This has now resolved.  Continue to monitor.        Follow up plan: Return in about 2 months (around 04/23/2021) for follow up.

## 2021-02-21 NOTE — Assessment & Plan Note (Signed)
Chronic.  Blood sugar readings are reportedly around her normal.  Continue current regimen, follow up in 2 months.  She agrees to flu shot today.

## 2021-03-25 ENCOUNTER — Encounter: Payer: Self-pay | Admitting: Nurse Practitioner

## 2021-03-25 ENCOUNTER — Ambulatory Visit
Admission: RE | Admit: 2021-03-25 | Discharge: 2021-03-25 | Disposition: A | Discharge status: Home / Self Care | Payer: Medicaid Other | Source: Ambulatory Visit | Attending: Nurse Practitioner | Admitting: Nurse Practitioner

## 2021-03-25 ENCOUNTER — Ambulatory Visit: Payer: Medicaid Other | Admitting: Nurse Practitioner

## 2021-03-25 ENCOUNTER — Other Ambulatory Visit: Payer: Self-pay

## 2021-03-25 VITALS — BP 152/90 | HR 69 | Ht <= 58 in | Wt 163.0 lb

## 2021-03-25 DIAGNOSIS — J4521 Mild intermittent asthma with (acute) exacerbation: Secondary | ICD-10-CM

## 2021-03-25 DIAGNOSIS — R0600 Dyspnea, unspecified: Secondary | ICD-10-CM | POA: Diagnosis not present

## 2021-03-25 DIAGNOSIS — U071 COVID-19: Secondary | ICD-10-CM | POA: Diagnosis not present

## 2021-03-25 MED ORDER — PREDNISONE 20 MG PO TABS: 40.0000 mg | ORAL_TABLET | Freq: Every day | ORAL | 0 refills | Status: DC

## 2021-03-25 MED ORDER — AZITHROMYCIN 250 MG PO TABS: ORAL_TABLET | ORAL | 0 refills | Status: DC

## 2021-03-25 NOTE — Patient Instructions (Signed)
[facility] [address]

## 2021-03-25 NOTE — Progress Notes (Signed)
Subjective:    Patient ID: Joyce Gilmore, female    DOB: July 28, 1934, 87 y.o.   MRN: [id number]  HPI: Joyce Gilmore is a 87 y.o. female presenting with daughter for cough and congestion.  Interpreter, [place], used during visit reference [phone].  Chief Complaint  Patient presents with   Cough   UPPER RESPIRATORY TRACT INFECTION Onset: 10 days ago COVID-19 testing history: positive 10 days ago; negative 1/27 Fever: no Body aches:  yes Chills: no Cough: yes; productive - green Shortness of breath: yes Wheezing: yes Chest pain: yes Chest tightness: yes Chest congestion: yes Nasal congestion:  yes Runny nose: yes Post nasal drip: no Sneezing: no Sore throat: yes Swollen glands: no Sinus pressure: yes Headache: yes Face pain: no Toothache: no Ear pain: yes  Ear pressure: no  Eyes red/itching:no Eye drainage/crusting: no  Nausea: yes Vomiting: no Diarrhea: no  Change in appetite: yes ; decreased she is eating plenty Loss of taste/smell: no  Rash: no Fatigue: yes Context: worse  Allergies  Allergen Reactions   Bactrim [Sulfamethoxazole-Trimethoprim] Other (See Comments)    Hyperkalemia (elevated potassium)   Rifampin Other (See Comments)    Severe thrombocytopenia (low blood platelet count)   Vancomycin Other (See Comments)    Severe thrombocytopenia (low blood platelet count)    Outpatient Encounter Medications as of 03/25/2021  Medication Sig   predniSONE (DELTASONE) 20 MG tablet Take 2 tablets (40 mg total) by mouth daily with breakfast.   Acetaminophen (TYLENOL PO) Take 1,000 tablets by mouth every 6 (six) hours as needed (pain/headache).  (Patient not taking: Reported on 02/21/2021)   albuterol (PROVENTIL) (2.5 MG/3ML) 0.083% nebulizer solution Take 3 mLs (2.5 mg total) by nebulization every 6 (six) hours as needed for wheezing or shortness of breath.   albuterol (VENTOLIN HFA) 108 (90 Base) MCG/ACT inhaler Inhale 1-2 puffs into the lungs every 4  (four) hours as needed for wheezing or shortness of breath.   amLODipine (NORVASC) 5 MG tablet TAKE 1 TABLET(5 MG) BY MOUTH DAILY   aspirin (ASPIRIN 81) 81 MG EC tablet Take 1 tablet (81 mg total) by mouth daily. Swallow whole.   azithromycin (ZITHROMAX) 250 MG tablet Take 2 tablets x 1 day, then 1 tablet daily for 4 days- [place]   Blood Glucose Monitoring Suppl (BLOOD GLUCOSE SYSTEM PAK) KIT Please dispense based on patient and insurance preference.  Use as directed to monitor FSBS four times daily.  Dx E11.22   budesonide-formoterol (SYMBICORT) 160-4.5 MCG/ACT inhaler Inhale 2 puffs into the lungs 2 (two) times daily.   Calcium Carbonate-Vit D-Min (CALCIUM 1200 PO) Take by mouth.   fluticasone (FLONASE) 50 MCG/ACT nasal spray Place 2 sprays into both nostrils daily.   furosemide (LASIX) 20 MG tablet Take 1 tablet (20 mg total) by mouth daily.   gabapentin (NEURONTIN) 100 MG capsule Take 1 capsule (100 mg total) by mouth at bedtime as needed (nerve pain).   glucose blood test strip Please dispense based on patient and insurance preference.  Use as directed to monitor FSBS four times daily.  Dx E11.22   insulin aspart (NOVOLOG FLEXPEN) 100 UNIT/ML FlexPen ADMINISTER 5 TO 15 UNITS UNDER THE SKIN THREE TIMES DAILY WITH MEALS PER SLIDING SCALE   insulin glargine (LANTUS SOLOSTAR) 100 UNIT/ML Solostar Pen INJECT  38 UNITS UNDER THE SKIN AT BEDTIME   Insulin Pen Needle 32G X 4 MM MISC USE AS DIRECTED TO INJECT INSULIN 4X DAILY. DX: E11.9.   Iron, Ferrous  Sulfate, 325 (65 Fe) MG TABS Take 325 mg by mouth daily. TAKE IN AM WITH ORANGE JUICE.   Lancets MISC Please dispense based on patient and insurance preference.  Use as directed to monitor FSBS four times daily.  Dx E11.22   lansoprazole (PREVACID) 30 MG capsule TAKE ONE CAPSULE BY MOUTH DAILY AT 12 NOON   lisinopril (ZESTRIL) 10 MG tablet Take 1 tablet (10 mg total) by mouth daily.   loratadine (CLARITIN) 10 MG tablet TAKE 1 TABLET(10 MG) BY MOUTH  DAILY AS NEEDED FOR ALLERGIES   metoprolol tartrate (LOPRESSOR) 25 MG tablet TAKE 1/2 TABLET(12.5 MG) BY MOUTH TWICE DAILY   polyvinyl alcohol (ARTIFICIAL TEARS) 1.4 % ophthalmic solution Place 1 drop into both eyes as needed for dry eyes.   pravastatin (PRAVACHOL) 20 MG tablet TAKE 1 TABLET(20 MG) BY MOUTH DAILY   sucralfate (CARAFATE) 1 g tablet TAKE 1 TABLET(1 GRAM) BY MOUTH FOUR TIMES DAILY   tizanidine (ZANAFLEX) 2 MG capsule Take 1 capsule (2 mg total) by mouth 3 (three) times daily.   traMADol (ULTRAM) 50 MG tablet Take 1-2 tablets (50-100 mg total) by mouth every 6 (six) hours as needed.   No facility-administered encounter medications on file as of 03/25/2021.    Patient Active Problem List   Diagnosis Date Noted   Type 2 DM with CKD stage 3 and hypertension ([place]) 07/12/2020   Chronic kidney disease 05/07/2018   GERD (gastroesophageal reflux disease) 03/10/2017   Uses hearing aid 09/12/2016   Asthmatic bronchitis 03/11/2016   Chronic diastolic heart failure ([place]) 10/12/2015   Seasonal allergies 11/21/2014   DDD (degenerative disc disease), lumbar 10/11/2014   H/O compression fracture of spine 10/11/2014   Pulmonary hypertension ([place])    Hepatic cirrhosis ([place]) 09/24/2014   SIADH (syndrome of inappropriate ADH production) ([place]) 06/13/2014   Insomnia 12/28/2013   Anemia 07/06/2013   HLD (hyperlipidemia) 06/27/2013   Osteoporosis 07/26/2012   Hyponatremia 05/16/2011   Type 2 diabetes mellitus with diabetic chronic kidney disease ([place]) 08/17/2006   Essential hypertension 08/17/2006   DIVERTICULOSIS, COLON 08/17/2006    Past Medical History:  Diagnosis Date   Arthritis    CHF (congestive heart failure) (HCC)    diastolic   Chronic kidney disease    hx of kidney stones,    Depression    hx of    Diabetes mellitus without complication (HCC)    Ganglion, left ankle and foot    Hearing loss of both ears    Hypertension    MRSA infection    Oct 13 - Nov 13    Osteoporosis    Tinnitus of both ears     Relevant past medical, surgical, family and social history reviewed and updated as indicated. Interim medical history since our last visit reviewed.  Review of Systems Per HPI unless specifically indicated above     Objective:    BP (!) 152/90    Pulse 69    Ht $R'4\' 10"'Kv$  (1.473 m)    Wt 163 lb (73.9 kg)    SpO2 94%    BMI 34.07 kg/m   Wt Readings from Last 3 Encounters:  03/25/21 163 lb (73.9 kg)  02/21/21 163 lb (73.9 kg)  02/14/21 165 lb (74.8 kg)    Physical Exam Vitals and nursing note reviewed.  Constitutional:      General: She is not in acute distress.    Appearance: Normal appearance. She is not ill-appearing or toxic-appearing.  HENT:     Head:  Normocephalic and atraumatic.     Right Ear: Tympanic membrane, ear canal and external ear normal.     Left Ear: Tympanic membrane, ear canal and external ear normal.     Nose: No congestion or rhinorrhea.     Mouth/Throat:     Mouth: Mucous membranes are moist.     Pharynx: Oropharynx is clear.  Eyes:     General: No scleral icterus.    Extraocular Movements: Extraocular movements intact.  Cardiovascular:     Rate and Rhythm: Normal rate.  Pulmonary:     Effort: Pulmonary effort is normal. Tachypnea present.     Breath sounds: Wheezing present. No rhonchi.  Musculoskeletal:     Right lower leg: No edema.     Left lower leg: No edema.  Skin:    General: Skin is warm and dry.     Capillary Refill: Capillary refill takes less than 2 seconds.     Coloration: Skin is not jaundiced or pale.     Findings: No erythema.  Neurological:     Mental Status: She is alert and oriented to person, place, and time. Mental status is at baseline.     Gait: Gait abnormal (walks with cane).      Assessment & Plan:  1. Mild intermittent asthma with exacerbation Chronic.  Has been using daily inhaler and rescue nebulizer.  Encouraged pulmonary toilet with deep breathing, guaifenesin, and nebulizer  every 4-6 hours as needed.  Start prednisone burst to help with reactive airway wheezing.  Start azithromycin given purulence of sputum.   Check chest x-ray; if pneumonia present will want to ensure complete resolution.  Follow up in 3 day to reassess symptoms.  - DG Chest 2 View; Future - predniSONE (DELTASONE) 20 MG tablet; Take 2 tablets (40 mg total) by mouth daily with breakfast.  Dispense: 10 tablet; Refill: 0 - azithromycin (ZITHROMAX) 250 MG tablet; Take 2 tablets x 1 day, then 1 tablet daily for 4 days- [place]  Dispense: 6 tablet; Refill: 0  2. Dyspnea due to COVID-19     Follow up plan: Return in about 3 days (around 03/28/2021) for breathing follow up.

## 2021-04-02 ENCOUNTER — Encounter: Payer: Self-pay | Admitting: Nurse Practitioner

## 2021-04-22 ENCOUNTER — Ambulatory Visit: Payer: Self-pay | Admitting: Orthopaedic Surgery

## 2021-04-25 ENCOUNTER — Other Ambulatory Visit: Payer: Self-pay

## 2021-04-25 ENCOUNTER — Ambulatory Visit (INDEPENDENT_AMBULATORY_CARE_PROVIDER_SITE_OTHER): Payer: Medicaid Other | Admitting: Nurse Practitioner

## 2021-04-25 ENCOUNTER — Encounter: Payer: Self-pay | Admitting: Nurse Practitioner

## 2021-04-25 ENCOUNTER — Ambulatory Visit: Payer: Self-pay | Admitting: Orthopedic Surgery

## 2021-04-25 VITALS — BP 110/80 | HR 68 | Temp 97.8°F | Ht <= 58 in | Wt 169.0 lb

## 2021-04-25 DIAGNOSIS — E7849 Other hyperlipidemia: Secondary | ICD-10-CM | POA: Diagnosis not present

## 2021-04-25 DIAGNOSIS — J4540 Moderate persistent asthma, uncomplicated: Secondary | ICD-10-CM | POA: Diagnosis not present

## 2021-04-25 DIAGNOSIS — E1122 Type 2 diabetes mellitus with diabetic chronic kidney disease: Secondary | ICD-10-CM

## 2021-04-25 DIAGNOSIS — H9193 Unspecified hearing loss, bilateral: Secondary | ICD-10-CM

## 2021-04-25 DIAGNOSIS — I10 Essential (primary) hypertension: Secondary | ICD-10-CM

## 2021-04-25 DIAGNOSIS — M150 Primary generalized (osteo)arthritis: Secondary | ICD-10-CM

## 2021-04-25 DIAGNOSIS — K219 Gastro-esophageal reflux disease without esophagitis: Secondary | ICD-10-CM | POA: Diagnosis not present

## 2021-04-25 DIAGNOSIS — Z794 Long term (current) use of insulin: Secondary | ICD-10-CM

## 2021-04-25 DIAGNOSIS — R600 Localized edema: Secondary | ICD-10-CM

## 2021-04-25 DIAGNOSIS — M159 Polyosteoarthritis, unspecified: Secondary | ICD-10-CM

## 2021-04-25 DIAGNOSIS — N182 Chronic kidney disease, stage 2 (mild): Secondary | ICD-10-CM

## 2021-04-25 DIAGNOSIS — G8929 Other chronic pain: Secondary | ICD-10-CM

## 2021-04-25 DIAGNOSIS — M109 Gout, unspecified: Secondary | ICD-10-CM | POA: Diagnosis not present

## 2021-04-25 DIAGNOSIS — F325 Major depressive disorder, single episode, in full remission: Secondary | ICD-10-CM | POA: Diagnosis not present

## 2021-04-25 DIAGNOSIS — M5442 Lumbago with sciatica, left side: Secondary | ICD-10-CM

## 2021-04-25 MED ORDER — BUDESONIDE-FORMOTEROL FUMARATE 160-4.5 MCG/ACT IN AERO: 2.0000 | INHALATION_SPRAY | Freq: Two times a day (BID) | RESPIRATORY_TRACT | 0 refills | Status: DC

## 2021-04-25 MED ORDER — LANTUS SOLOSTAR 100 UNIT/ML ~~LOC~~ SOPN: PEN_INJECTOR | SUBCUTANEOUS | 5 refills | Status: DC

## 2021-04-25 MED ORDER — FUROSEMIDE 20 MG PO TABS: 20.0000 mg | ORAL_TABLET | Freq: Every day | ORAL | 0 refills | Status: DC

## 2021-04-25 MED ORDER — NOVOLOG FLEXPEN 100 UNIT/ML ~~LOC~~ SOPN: PEN_INJECTOR | SUBCUTANEOUS | 1 refills | Status: DC

## 2021-04-25 MED ORDER — LISINOPRIL 10 MG PO TABS: 10.0000 mg | ORAL_TABLET | Freq: Every day | ORAL | 0 refills | Status: DC

## 2021-04-25 MED ORDER — METOPROLOL TARTRATE 25 MG PO TABS: ORAL_TABLET | ORAL | 0 refills | Status: DC

## 2021-04-25 MED ORDER — LANSOPRAZOLE 30 MG PO CPDR: DELAYED_RELEASE_CAPSULE | ORAL | 1 refills | Status: DC

## 2021-04-25 MED ORDER — AMLODIPINE BESYLATE 5 MG PO TABS: ORAL_TABLET | ORAL | 0 refills | Status: DC

## 2021-04-25 MED ORDER — PRAVASTATIN SODIUM 20 MG PO TABS: ORAL_TABLET | ORAL | 0 refills | Status: DC

## 2021-04-25 NOTE — Progress Notes (Signed)
Careteam: Patient Care Team: Lauree Chandler, NP as PCP - General (Geriatric Medicine) Mcarthur Rossetti, MD as Consulting Physician (Orthopedic Surgery)  PLACE OF SERVICE:  [place] Directive information Does Patient Have a Medical Advance Directive?: No  Allergies  Allergen Reactions   Bactrim [Sulfamethoxazole-Trimethoprim] Other (See Comments)    Hyperkalemia (elevated potassium)   Rifampin Other (See Comments)    Severe thrombocytopenia (low blood platelet count)   Vancomycin Other (See Comments)    Severe thrombocytopenia (low blood platelet count)    Chief Complaint  Patient presents with   Establish Care    New patient establishing care. Concerns about feet swelling, elevated glucose levels. Patient needs refills on medications.Patient has pain from top of head to base of the neck.      HPI: Patient is a 87 y.o. female to establish care.  She is here today with son but sister takes care of her.  He does not know a lot about her as sister is primary care giver.  She lives with her daughter.  She goes to [place] frequently and will stay for ~3 months.  Hypertension- stable on amlodipine, lisinopril, metoprolol half tablet twice daily  COPD- on symbicort using twice daily using 2 or 3 puffs, ongoing shortness of breath. Family and pt ? Ooverweight, deconditioning  Diabetes- on lanus and novolog  Reports she novolog 6 units with her meals and then lantus 38 units at 9 pm  Gout - has colchicine says she is using for swelling. Has been using 1 tablet daily but has been out- not currently using  LE edema- has lasix 20 mg daily.   GERD- on lansoprazole says she has never had GERD, indigestion does have COPD with chronic cough.   Unsure why she is taking carafate, only using 1 daily   Hx of depression due to loss of a grandchild. Has anxiety when she has to travel.  She still goes twice a year.   Has had bilateral knee replacement and right  hip replacement- no current pain   Review of Systems:  Review of Systems  Constitutional:  Negative for chills, fever and weight loss.  HENT:  Positive for hearing loss. Negative for tinnitus.   Respiratory:  Positive for cough and shortness of breath. Negative for sputum production.   Cardiovascular:  Negative for chest pain, palpitations and leg swelling.  Gastrointestinal:  Negative for abdominal pain, constipation, diarrhea and heartburn.  Genitourinary:  Positive for frequency and urgency. Negative for dysuria.  Musculoskeletal:  Positive for back pain. Negative for falls, joint pain and myalgias.  Skin: Negative.   Neurological:  Negative for dizziness and headaches.  Psychiatric/Behavioral:  Negative for depression and memory loss. The patient is nervous/anxious. The patient does not have insomnia.    Past Medical History:  Diagnosis Date   Arthritis    Asthma    Per new patient form   CHF (congestive heart failure) ([place])    diastolic   Chronic kidney disease    hx of kidney stones,    Depression    hx of    Diabetes mellitus without complication (HCC)    Ganglion, left ankle and foot    Gout    Per new patient form   Hearing loss    Per new patient form   Hearing loss of both ears    Hypertension    MRSA infection    Oct 13 - Nov 13   Osteoporosis  Tinnitus of both ears    Past Surgical History:  Procedure Laterality Date   INCISION AND DRAINAGE HIP  12/22/2011   Procedure: IRRIGATION AND DEBRIDEMENT HIP;  Surgeon: Mcarthur Rossetti, MD;  Location: [facility];  Service: Orthopedics;  Laterality: Right;  Irrigation and debridement right hip   INCISION AND DRAINAGE HIP  12/27/2011   Procedure: IRRIGATION AND DEBRIDEMENT HIP;  Surgeon: Mcarthur Rossetti, MD;  Location: [facility];  Service: Orthopedics;  Laterality: Right;  Repeat irrigation and debridement   JOINT REPLACEMENT     bilateral knee, right hip    MASS EXCISION N/A 01/03/2020   Procedure: EXCISION OF  HEMANGIOMA OF THE PALATE;  Surgeon: Leta Baptist, MD;  Location: [facility];  Service: ENT;  Laterality: N/A;   TOTAL HIP REVISION  12/05/2011   Procedure: TOTAL HIP REVISION;  Surgeon: Mcarthur Rossetti, MD;  Location: [facility];  Service: Orthopedics;  Laterality: Right;  Right Hip Revision Arthroplasty to Total Hip, Excision of Old Implant   Social History:   reports that she has never smoked. She has never used smokeless tobacco. She reports that she does not drink alcohol and does not use drugs.  Family History  Problem Relation Age of Onset   Hypertension Mother    Colon cancer Neg Hx    Breast cancer Neg Hx     Medications: Patient's Medications  New Prescriptions   No medications on file  Previous Medications   GLUCOSE BLOOD TEST STRIP    Please dispense based on patient and insurance preference.  Use as directed to monitor FSBS four times daily.  Dx E11.22   LANCETS MISC    Please dispense based on patient and insurance preference.  Use as directed to monitor FSBS four times daily.  Dx E11.22   SUCRALFATE (CARAFATE) 1 G TABLET    TAKE 1 TABLET(1 GRAM) BY MOUTH FOUR TIMES DAILY  Modified Medications   Modified Medication Previous Medication   AMLODIPINE (NORVASC) 5 MG TABLET amLODipine (NORVASC) 5 MG tablet      TAKE 1 TABLET(5 MG) BY MOUTH DAILY    TAKE 1 TABLET(5 MG) BY MOUTH DAILY   BUDESONIDE-FORMOTEROL (SYMBICORT) 160-4.5 MCG/ACT INHALER budesonide-formoterol (SYMBICORT) 160-4.5 MCG/ACT inhaler      Inhale 2 puffs into the lungs 2 (two) times daily.    Inhale 2 puffs into the lungs 2 (two) times daily.   FUROSEMIDE (LASIX) 20 MG TABLET furosemide (LASIX) 20 MG tablet      Take 1 tablet (20 mg total) by mouth daily.    Take 1 tablet (20 mg total) by mouth daily.   INSULIN ASPART (NOVOLOG FLEXPEN) 100 UNIT/ML FLEXPEN insulin aspart (NOVOLOG FLEXPEN) 100 UNIT/ML FlexPen      ADMINISTER 5 TO 15 UNITS UNDER THE SKIN THREE TIMES DAILY WITH MEALS PER SLIDING SCALE     ADMINISTER 5 TO 15 UNITS UNDER THE SKIN THREE TIMES DAILY WITH MEALS PER SLIDING SCALE   INSULIN GLARGINE (LANTUS SOLOSTAR) 100 UNIT/ML SOLOSTAR PEN insulin glargine (LANTUS SOLOSTAR) 100 UNIT/ML Solostar Pen      INJECT  38 UNITS UNDER THE SKIN AT BEDTIME    INJECT  38 UNITS UNDER THE SKIN AT BEDTIME   LANSOPRAZOLE (PREVACID) 30 MG CAPSULE lansoprazole (PREVACID) 30 MG capsule      TAKE ONE CAPSULE BY MOUTH DAILY    TAKE ONE CAPSULE BY MOUTH DAILY AT 12 NOON   LISINOPRIL (ZESTRIL) 10 MG TABLET lisinopril (ZESTRIL) 10 MG tablet  Take 1 tablet (10 mg total) by mouth daily.    Take 1 tablet (10 mg total) by mouth daily.   METOPROLOL TARTRATE (LOPRESSOR) 25 MG TABLET metoprolol tartrate (LOPRESSOR) 25 MG tablet      TAKE 1/2 TABLET(12.5 MG) BY MOUTH TWICE DAILY    TAKE 1/2 TABLET(12.5 MG) BY MOUTH TWICE DAILY   PRAVASTATIN (PRAVACHOL) 20 MG TABLET pravastatin (PRAVACHOL) 20 MG tablet      TAKE 1 TABLET(20 MG) BY MOUTH DAILY    TAKE 1 TABLET(20 MG) BY MOUTH DAILY  Discontinued Medications   ASPIRIN (ASPIRIN 81) 81 MG EC TABLET    Take 1 tablet (81 mg total) by mouth daily. Swallow whole.   CALCIUM CARBONATE-VIT D-MIN (CALCIUM 1200 PO)    Take by mouth.   COLCHICINE (MITIGARE PO)    Take by mouth daily.   FLUTICASONE (FLONASE) 50 MCG/ACT NASAL SPRAY    Place 2 sprays into both nostrils daily.    Physical Exam:  Vitals:   04/25/21 1419  BP: 110/80  Pulse: 68  Temp: 97.8 F (36.6 C)  SpO2: 95%  Weight: 169 lb (76.7 kg)  Height: $Remove'4\' 10"'LTWiwYb$  (1.473 m)   Body mass index is 35.32 kg/m. Wt Readings from Last 3 Encounters:  04/25/21 169 lb (76.7 kg)  03/25/21 163 lb (73.9 kg)  02/21/21 163 lb (73.9 kg)    Physical Exam Constitutional:      General: She is not in acute distress.    Appearance: She is well-developed. She is not diaphoretic.  HENT:     Head: Normocephalic and atraumatic.     Mouth/Throat:     Pharynx: No oropharyngeal exudate.  Eyes:     Conjunctiva/sclera: Conjunctivae  normal.     Pupils: Pupils are equal, round, and reactive to light.  Cardiovascular:     Rate and Rhythm: Normal rate and regular rhythm.     Heart sounds: Normal heart sounds.  Pulmonary:     Effort: Pulmonary effort is normal.     Breath sounds: Normal breath sounds.  Abdominal:     General: Bowel sounds are normal.     Palpations: Abdomen is soft.  Musculoskeletal:     Cervical back: Normal range of motion and neck supple.     Right lower leg: No edema.     Left lower leg: No edema.  Skin:    General: Skin is warm and dry.  Neurological:     Mental Status: She is alert.  Psychiatric:        Mood and Affect: Mood normal.    Labs reviewed: Basic Metabolic Panel: Recent Labs    11/07/20 1508 01/25/21 1213 02/14/21 2315  NA 134* 134* 130*  K 5.0 4.4 4.5  CL 99 100 95*  CO2 $Re'26 27 25  'hkm$ GLUCOSE 148* 54* 106*  BUN 37* 25 20  CREATININE 1.22* 1.03* 0.93  CALCIUM 9.5 9.3 9.1   Liver Function Tests: Recent Labs    09/07/20 2034 09/14/20 1122 11/07/20 1508  AST $Re'24 20 17  'MHy$ ALT $R'18 20 15  'Fi$ ALKPHOS 80  --   --   BILITOT 0.3 0.3 0.3  PROT 7.1 6.5 7.0  ALBUMIN 3.9  --   --    No results for input(s): LIPASE, AMYLASE in the last 8760 hours. No results for input(s): AMMONIA in the last 8760 hours. CBC: Recent Labs    09/07/20 2034 09/14/20 1122 11/07/20 1508 02/14/21 2315  WBC 7.9 8.3 9.8 8.1  NEUTROABS 4.5 4,739 6,037  --  HGB 11.6* 11.6* 12.7 14.6  HCT 35.0* 36.5 39.6 44.0  MCV 79.5* 82.2 87.8 85.9  PLT 220 238 211 194   Lipid Panel: No results for input(s): CHOL, HDL, LDLCALC, TRIG, CHOLHDL, LDLDIRECT in the last 8760 hours. TSH: No results for input(s): TSH in the last 8760 hours. A1C: Lab Results  Component Value Date   HGBA1C 7.6 (H) 01/25/2021     Assessment/Plan 1. Essential hypertension -Blood pressure well controlled Continue current medications Recheck metabolic panel - amLODipine (NORVASC) 5 MG tablet; TAKE 1 TABLET(5 MG) BY MOUTH DAILY   Dispense: 90 tablet; Refill: 0 - furosemide (LASIX) 20 MG tablet; Take 1 tablet (20 mg total) by mouth daily.  Dispense: 90 tablet; Refill: 0 - lisinopril (ZESTRIL) 10 MG tablet; Take 1 tablet (10 mg total) by mouth daily.  Dispense: 90 tablet; Refill: 0 - metoprolol tartrate (LOPRESSOR) 25 MG tablet; TAKE 1/2 TABLET(12.5 MG) BY MOUTH TWICE DAILY  Dispense: 45 tablet; Refill: 0 - CMP with eGFR([facility]); Future - CBC with Differential/Platelet; Future  2. Moderate persistent asthmatic bronchitis without complication -ongoing symptoms, likely related to debility and obesity. Will monitor may need changes to regimen. - budesonide-formoterol (SYMBICORT) 160-4.5 MCG/ACT inhaler; Inhale 2 puffs into the lungs 2 (two) times daily.  Dispense: 3 each; Refill: 0  3. Type 2 diabetes mellitus with stage 2 chronic kidney disease, with long-term current use of insulin ([place]) -- discussed with the patient the pathophysiology of diabetes and the natural progression of the disease.  -stressed the importance of lifestyle changes including diet and exercise. -discussed complications associated with diabetes including retinopathy, nephropathy, neuropathy as well as increased risk of cardiovascular disease. We went over the benefit seen with glycemic control.  Encouraged dietary compliance, routine foot care/monitoring and to keep up with diabetic eye exams through ophthalmology  - insulin aspart (NOVOLOG FLEXPEN) 100 UNIT/ML FlexPen; ADMINISTER 5 TO 15 UNITS UNDER THE SKIN THREE TIMES DAILY WITH MEALS PER SLIDING SCALE  Dispense: 15 mL; Refill: 1 - insulin glargine (LANTUS SOLOSTAR) 100 UNIT/ML Solostar Pen; INJECT  38 UNITS UNDER THE SKIN AT BEDTIME  Dispense: 15 mL; Refill: 5 - Hemoglobin A1c; Future - Amb ref to Medical Nutrition Therapy-MNT  4. Gastroesophageal reflux disease, unspecified whether esophagitis present -stable on prevacid - lansoprazole (PREVACID) 30 MG capsule; TAKE ONE CAPSULE BY MOUTH DAILY   Dispense: 90 capsule; Refill: 1  5. Other hyperlipidemia -dietary modifications recommended to continue with medication LDL goal <70 - pravastatin (PRAVACHOL) 20 MG tablet; TAKE 1 TABLET(20 MG) BY MOUTH DAILY  Dispense: 90 tablet; Refill: 0 - Lipid panel; Future - CMP with eGFR([facility]); Future  6. Bilateral hearing loss, unspecified hearing loss type -has lost her hearing aides, would like referral to audiologist for replacement - Ambulatory referral to Audiology  7. Depression, major, in remission ([place]) -stable, no currently on medications.   8. Primary osteoarthritis involving multiple joints Stable at this time.  9. Gout, unspecified cause, unspecified chronicity, unspecified site -not currently on medication for prevention.  - Uric Acid; Future  10. Morbid obesity ([place]) -education provided on healthy weight loss through increase in physical activity and proper nutrition   11. Chronic left-sided low back pain with left-sided sciatica -noted at the end of visit. To follow up at next appt.   12. Lower extremity edema --encouraged to elevate legs above level of heart as tolerates, low sodium diet, compression hose as tolerates (on in am, off in pm)  Return in about 4 weeks (around 05/23/2021) for  new patient follow up with [facility] -PCP.:  Carlos American. [place], [facility] [phone]

## 2021-04-30 ENCOUNTER — Encounter: Payer: Self-pay | Admitting: Orthopedic Surgery

## 2021-05-01 ENCOUNTER — Ambulatory Visit (INDEPENDENT_AMBULATORY_CARE_PROVIDER_SITE_OTHER): Payer: Medicaid Other | Admitting: Orthopaedic Surgery

## 2021-05-01 ENCOUNTER — Other Ambulatory Visit: Payer: Self-pay

## 2021-05-01 ENCOUNTER — Other Ambulatory Visit: Payer: Medicaid Other

## 2021-05-01 ENCOUNTER — Ambulatory Visit: Payer: Self-pay | Admitting: Orthopaedic Surgery

## 2021-05-01 ENCOUNTER — Ambulatory Visit (INDEPENDENT_AMBULATORY_CARE_PROVIDER_SITE_OTHER): Payer: Medicaid Other

## 2021-05-01 DIAGNOSIS — G8929 Other chronic pain: Secondary | ICD-10-CM

## 2021-05-01 DIAGNOSIS — E1122 Type 2 diabetes mellitus with diabetic chronic kidney disease: Secondary | ICD-10-CM | POA: Diagnosis not present

## 2021-05-01 DIAGNOSIS — M25511 Pain in right shoulder: Secondary | ICD-10-CM | POA: Diagnosis not present

## 2021-05-01 DIAGNOSIS — N182 Chronic kidney disease, stage 2 (mild): Secondary | ICD-10-CM | POA: Diagnosis not present

## 2021-05-01 DIAGNOSIS — E7849 Other hyperlipidemia: Secondary | ICD-10-CM | POA: Diagnosis not present

## 2021-05-01 DIAGNOSIS — M12811 Other specific arthropathies, not elsewhere classified, right shoulder: Secondary | ICD-10-CM

## 2021-05-01 DIAGNOSIS — I10 Essential (primary) hypertension: Secondary | ICD-10-CM | POA: Diagnosis not present

## 2021-05-01 DIAGNOSIS — M109 Gout, unspecified: Secondary | ICD-10-CM | POA: Diagnosis not present

## 2021-05-01 DIAGNOSIS — Z794 Long term (current) use of insulin: Secondary | ICD-10-CM | POA: Diagnosis not present

## 2021-05-01 MED ORDER — METHYLPREDNISOLONE ACETATE 40 MG/ML IJ SUSP
40.0000 mg | INTRAMUSCULAR | Status: AC | PRN
  Administered 2021-05-01: 40 mg via INTRA_ARTICULAR

## 2021-05-01 MED ORDER — LIDOCAINE HCL 1 % IJ SOLN
3.0000 mL | INTRAMUSCULAR | Status: AC | PRN
  Administered 2021-05-01: 3 mL

## 2021-05-01 NOTE — Progress Notes (Signed)
? ?Office Visit Note ?  ?Patient: Joyce Gilmore           ?Date of Birth: 26-Apr-1934           ?MRN: [id number] ?Visit Date: 05/01/2021 ?             ?Requested by: Eulogio Bear, NP ?No address on file ?PCP: Yvonna Alanis, NP ? ? ?Assessment & Plan: ?Visit Diagnoses:  ?1. Chronic right shoulder pain   ?2. Rotator cuff arthropathy, right   ? ? ?Plan: She does have evidence of rotator cuff arthropathy.  I recommended a steroid injection in the subacromial outlet because we felt like we get in there easily and we did.  I think this will help temporize her pain.  I talked to her and her daughter in length.  1 possibility would be considering a shoulder replacement down the road if the pain gets bad enough in her loss of function is bad enough.  There is no harm in trying steroid injections 3 to 4 months apart if needed. ? ?Follow-Up Instructions: Return if symptoms worsen or fail to improve.  ? ?Orders:  ?Orders Placed This Encounter  ?Procedures  ? Large Joint Inj  ? XR Shoulder Right  ? ?No orders of the defined types were placed in this encounter. ? ? ? ? Procedures: ?Large Joint Inj: R subacromial bursa on 05/01/2021 3:11 PM ?Indications: pain and diagnostic evaluation ?Details: 22 G 1.5 in needle ? ?Arthrogram: No ? ?Medications: 3 mL lidocaine 1 %; 40 mg methylPREDNISolone acetate 40 MG/ML ?Outcome: tolerated well, no immediate complications ?Procedure, treatment alternatives, risks and benefits explained, specific risks discussed. Consent was given by the patient. Immediately prior to procedure a time out was called to verify the correct patient, procedure, equipment, support staff and site/side marked as required. Patient was prepped and draped in the usual sterile fashion.  ? ? ? ? ?Clinical Data: ?No additional findings. ? ? ?Subjective: ?Chief Complaint  ?Patient presents with  ? Right Shoulder - Pain  ?Joyce Gilmore comes in today for evaluation treatment of chronic right shoulder pain and loss of  function with weakness.  She has no known injury.  This is been a chronic issue.  I last x-rayed her shoulder back in 2017.  She does have problems with overhead activities and pain in the right shoulder itself.  She does ambulate a cane and uses it on that side.  She is traveling soon. ? ?HPI ? ?Review of Systems ?There is no listed fever, chills, nausea, vomiting ? ?Objective: ?Vital Signs: There were no vitals taken for this visit. ? ?Physical Exam ?She is alert and orient x3 and in no acute distress ?Ortho Exam ?Examination of her right shoulder does show weakness of the rotator cuff that is significant.  The shoulder is well located. ?Specialty Comments:  ?No specialty comments available. ? ?Imaging: ?XR Shoulder Right ? ?Result Date: 05/01/2021 ?3 views of the right shoulder show a located shoulder with no significant arthritic changes of the glenohumeral joint.  However the humeral head is high riding with decrease in the subacromial outlet.  ? ? ?PMFS History: ?Patient Active Problem List  ? Diagnosis Date Noted  ? Type 2 DM with CKD stage 3 and hypertension ([place]) 07/12/2020  ? Chronic kidney disease 05/07/2018  ? GERD (gastroesophageal reflux disease) 03/10/2017  ? Uses hearing aid 09/12/2016  ? Asthmatic bronchitis 03/11/2016  ? Chronic diastolic heart failure ([place]) 10/12/2015  ? Seasonal allergies 11/21/2014  ?  DDD (degenerative disc disease), lumbar 10/11/2014  ? H/O compression fracture of spine 10/11/2014  ? Pulmonary hypertension ([place])   ? Hepatic cirrhosis ([place]) 09/24/2014  ? SIADH (syndrome of inappropriate ADH production) ([facility]) 06/13/2014  ? Insomnia 12/28/2013  ? Anemia 07/06/2013  ? HLD (hyperlipidemia) 06/27/2013  ? Osteoporosis 07/26/2012  ? Hyponatremia 05/16/2011  ? Type 2 diabetes mellitus with diabetic chronic kidney disease ([place]) 08/17/2006  ? Essential hypertension 08/17/2006  ? DIVERTICULOSIS, COLON 08/17/2006  ? ?Past Medical History:  ?Diagnosis Date  ? Arthritis   ? Asthma   ? Per new  patient form  ? CHF (congestive heart failure) ([facility])   ? diastolic  ? Chronic kidney disease   ? hx of kidney stones,   ? Depression   ? hx of   ? Diabetes mellitus without complication ([facility])   ? Ganglion, left ankle and foot   ? Gout   ? Per new patient form  ? Hearing loss   ? Per new patient form  ? Hearing loss of both ears   ? Hypertension   ? MRSA infection   ? Oct 13 - Nov 13  ? Osteoporosis   ? Tinnitus of both ears   ?  ?Family History  ?Problem Relation Age of Onset  ? Hypertension Mother   ? Colon cancer Neg Hx   ? Breast cancer Neg Hx   ?  ?Past Surgical History:  ?Procedure Laterality Date  ? INCISION AND DRAINAGE HIP  12/22/2011  ? Procedure: IRRIGATION AND DEBRIDEMENT HIP;  Surgeon: Mcarthur Rossetti, MD;  Location: [facility];  Service: Orthopedics;  Laterality: Right;  Irrigation and debridement right hip  ? INCISION AND DRAINAGE HIP  12/27/2011  ? Procedure: IRRIGATION AND DEBRIDEMENT HIP;  Surgeon: Mcarthur Rossetti, MD;  Location: [facility];  Service: Orthopedics;  Laterality: Right;  Repeat irrigation and debridement  ? JOINT REPLACEMENT    ? bilateral knee, right hip   ? MASS EXCISION N/A 01/03/2020  ? Procedure: EXCISION OF HEMANGIOMA OF THE PALATE;  Surgeon: Leta Baptist, MD;  Location: [facility];  Service: ENT;  Laterality: N/A;  ? TOTAL HIP REVISION  12/05/2011  ? Procedure: TOTAL HIP REVISION;  Surgeon: Mcarthur Rossetti, MD;  Location: [facility];  Service: Orthopedics;  Laterality: Right;  Right Hip Revision Arthroplasty to Total Hip, Excision of Old Implant  ? ?Social History  ? ?Occupational History  ? Not on file  ?Tobacco Use  ? Smoking status: Never  ? Smokeless tobacco: Never  ?Vaping Use  ? Vaping Use: Never used  ?Substance and Sexual Activity  ? Alcohol use: No  ?  Alcohol/week: 0.0 standard drinks  ? Drug use: No  ? Sexual activity: Not Currently  ? ? ? ? ? ? ?

## 2021-05-02 ENCOUNTER — Other Ambulatory Visit: Payer: Self-pay

## 2021-05-02 ENCOUNTER — Encounter: Payer: Self-pay | Admitting: Orthopedic Surgery

## 2021-05-02 DIAGNOSIS — E875 Hyperkalemia: Secondary | ICD-10-CM

## 2021-05-02 LAB — HEMOGLOBIN A1C
Hgb A1c MFr Bld: 7.1 % of total Hgb — ABNORMAL HIGH (ref <5.7)
Mean Plasma Glucose: 157 mg/dL
eAG (mmol/L): 8.7 mmol/L

## 2021-05-02 LAB — CBC WITH DIFFERENTIAL/PLATELET
Absolute Monocytes: 570 cells/uL (ref 200–950)
Basophils Absolute: 51 cells/uL (ref 0–200)
Basophils Relative: 0.8 %
Eosinophils Absolute: 141 cells/uL (ref 15–500)
Eosinophils Relative: 2.2 %
HCT: 38.2 % (ref 35.0–45.0)
Hemoglobin: 12.3 g/dL (ref 11.7–15.5)
Lymphs Abs: 2029 cells/uL (ref 850–3900)
MCH: 27.7 pg (ref 27.0–33.0)
MCHC: 32.2 g/dL (ref 32.0–36.0)
MCV: 86 fL (ref 80.0–100.0)
MPV: 9.9 fL (ref 7.5–12.5)
Monocytes Relative: 8.9 %
Neutro Abs: 3610 cells/uL (ref 1500–7800)
Neutrophils Relative %: 56.4 %
Platelets: 237 10*3/uL (ref 140–400)
RBC: 4.44 10*6/uL (ref 3.80–5.10)
RDW: 12.6 % (ref 11.0–15.0)
Total Lymphocyte: 31.7 %
WBC: 6.4 10*3/uL (ref 3.8–10.8)

## 2021-05-02 LAB — COMPLETE METABOLIC PANEL WITH GFR
AG Ratio: 1.6 (calc) (ref 1.0–2.5)
ALT: 13 U/L (ref 6–29)
AST: 18 U/L (ref 10–35)
Albumin: 4.1 g/dL (ref 3.6–5.1)
Alkaline phosphatase (APISO): 71 U/L (ref 37–153)
BUN: 18 mg/dL (ref 7–25)
CO2: 26 mmol/L (ref 20–32)
Calcium: 9.6 mg/dL (ref 8.6–10.4)
Chloride: 100 mmol/L (ref 98–110)
Creat: 0.95 mg/dL (ref 0.60–0.95)
Globulin: 2.5 g/dL (calc) (ref 1.9–3.7)
Glucose, Bld: 84 mg/dL (ref 65–99)
Potassium: 5.4 mmol/L — ABNORMAL HIGH (ref 3.5–5.3)
Sodium: 134 mmol/L — ABNORMAL LOW (ref 135–146)
Total Bilirubin: 0.5 mg/dL (ref 0.2–1.2)
Total Protein: 6.6 g/dL (ref 6.1–8.1)
eGFR: 59 mL/min/{1.73_m2} — ABNORMAL LOW (ref >=60)

## 2021-05-02 LAB — LIPID PANEL
Cholesterol: 136 mg/dL (ref <200)
HDL: 71 mg/dL (ref >=50)
LDL Cholesterol (Calc): 48 mg/dL (calc)
Non-HDL Cholesterol (Calc): 65 mg/dL (calc) (ref <130)
Total CHOL/HDL Ratio: 1.9 (calc) (ref <5.0)
Triglycerides: 89 mg/dL (ref <150)

## 2021-05-02 LAB — URIC ACID: Uric Acid, Serum: 6.2 mg/dL (ref 2.5–7.0)

## 2021-05-06 ENCOUNTER — Other Ambulatory Visit: Payer: Medicaid Other

## 2021-05-06 DIAGNOSIS — E875 Hyperkalemia: Secondary | ICD-10-CM

## 2021-05-08 ENCOUNTER — Other Ambulatory Visit: Payer: Self-pay

## 2021-05-08 ENCOUNTER — Other Ambulatory Visit: Payer: Medicaid Other

## 2021-05-08 DIAGNOSIS — D649 Anemia, unspecified: Secondary | ICD-10-CM

## 2021-05-08 DIAGNOSIS — E875 Hyperkalemia: Secondary | ICD-10-CM | POA: Diagnosis not present

## 2021-05-09 LAB — BASIC METABOLIC PANEL WITH GFR
BUN/Creatinine Ratio: 30 (calc) — ABNORMAL HIGH (ref 6–22)
BUN: 31 mg/dL — ABNORMAL HIGH (ref 7–25)
CO2: 26 mmol/L (ref 20–32)
Calcium: 9.4 mg/dL (ref 8.6–10.4)
Chloride: 99 mmol/L (ref 98–110)
Creat: 1.03 mg/dL — ABNORMAL HIGH (ref 0.60–0.95)
Glucose, Bld: 84 mg/dL (ref 65–99)
Potassium: 5 mmol/L (ref 3.5–5.3)
Sodium: 133 mmol/L — ABNORMAL LOW (ref 135–146)
eGFR: 54 mL/min/{1.73_m2} — ABNORMAL LOW (ref >=60)

## 2021-05-16 ENCOUNTER — Other Ambulatory Visit: Payer: Self-pay | Admitting: *Deleted

## 2021-05-16 DIAGNOSIS — E1122 Type 2 diabetes mellitus with diabetic chronic kidney disease: Secondary | ICD-10-CM

## 2021-05-16 MED ORDER — LANTUS SOLOSTAR 100 UNIT/ML ~~LOC~~ SOPN: PEN_INJECTOR | SUBCUTANEOUS | 5 refills | Status: DC

## 2021-05-16 NOTE — Telephone Encounter (Signed)
Patient granddaughter called requesting refill of patient's Lantus. Stated that the pharmacy never received the refill request.  ?Refaxed to pharmacy.  ?

## 2021-05-22 ENCOUNTER — Ambulatory Visit: Payer: Medicaid Other | Admitting: Nurse Practitioner

## 2021-06-21 ENCOUNTER — Ambulatory Visit: Payer: Medicaid Other | Admitting: Dietician

## 2021-07-24 ENCOUNTER — Ambulatory Visit: Payer: Medicaid Other | Admitting: Orthopaedic Surgery

## 2021-07-24 ENCOUNTER — Ambulatory Visit (INDEPENDENT_AMBULATORY_CARE_PROVIDER_SITE_OTHER): Payer: Medicaid Other

## 2021-07-24 DIAGNOSIS — M5136 Other intervertebral disc degeneration, lumbar region: Secondary | ICD-10-CM

## 2021-07-24 DIAGNOSIS — M79605 Pain in left leg: Secondary | ICD-10-CM

## 2021-07-24 MED ORDER — HYDROCODONE-ACETAMINOPHEN 5-325 MG PO TABS: 1.0000 | ORAL_TABLET | Freq: Four times a day (QID) | ORAL | 0 refills | Status: DC | PRN

## 2021-07-24 NOTE — Progress Notes (Signed)
Office Visit Note   Patient: Joyce Gilmore           Date of Birth: Jul 07, 1934           MRN: [id number] Visit Date: 07/24/2021              Requested by: Yvonna Alanis, NP [phone],  [place] [address] PCP: Yvonna Alanis, NP   Assessment & Plan: Visit Diagnoses:  1. Pain in left leg   2. DDD (degenerative disc disease), lumbar     Plan: We will send in hydrocodone for pain.  She will stop the tramadol.  In regards to her back she is encouraged to continue to work on ambulation.  She does have a walker at home.  We will send her to formal physical therapy for range of motion strengthening modalities home exercise program.  We will also set her up for repeat L4-5 left transforaminal injection with Dr. Ernestina Patches as soon as possible.  She will follow-up with [place] 2 weeks after the injections with see how she is doing overall.  If her condition worsens she will contact our office.  Follow-Up Instructions: Return 2 weeks after ESI.   Orders:  Orders Placed This Encounter  Procedures   XR HIP UNILAT W OR W/O PELVIS 1V LEFT   XR Lumbar Spine 2-3 Views   Meds ordered this encounter  Medications   HYDROcodone-acetaminophen (NORCO) 5-325 MG tablet    Sig: Take 1 tablet by mouth every 6 (six) hours as needed for moderate pain.    Dispense:  30 tablet    Refill:  0      Procedures: No procedures performed   Clinical Data: No additional findings.   Subjective: Chief Complaint  Patient presents with   Right Hip - Pain    HPI Patient 87 year old female well-known to Dr. Delilah Shan service comes in today with her son due to left leg pain status post a fall while being in [place].  Fall occurred at the earlier part of this month.  No loss of consciousness.  Initially just some soreness but now the pain is became worse and is radiating down the left leg.  Does not have any per se back pain as burning pain down the left leg.  States that she has the "shakes due to the pain".   She was given tramadol for the pain in [place].  She is here today for follow-up.  She is diabetic last hemoglobin A1c was 7.1.  History of epidural steroid injections back in 2021 with an left L4-5 transforaminal injection.  She states the pain is worse than it was back in 2001. An interpreter and her son helped to interpret as she speaks [place].  Review of Systems See HPI otherwise negative  Objective: Vital Signs: There were no vitals taken for this visit.  Physical Exam General: Well developed female in obvious pain.  No diaphoresis. Psych: Alert and oriented x3 Ortho Exam Bilateral lower extremities difficult to assess patient's strength but she has at least 4-5 strength throughout the lower extremities against resistance.  There is loss of communication due to the patient's hearing loss and the language barrier in her being able to cooperate for examination today.  Positive straight leg raise on the left negative on the right.  Good range of motion of bilateral hips without significant pain.  Good range of motion both knees without pain.  There is no obvious deformities of either lower extremity.  Dorsiflexion plantarflexion bilateral ankles intact. Specialty Comments:  No specialty comments available.  Imaging: XR HIP UNILAT W OR W/O PELVIS 1V LEFT  Result Date: 07/24/2021 AP pelvis lateral view left hip: Status post right total hip arthroplasty with well-seated components.  Left hip is well located.  No acute fractures or acute findings throughout the the pelvis and left hip.  XR Lumbar Spine 2-3 Views  Result Date: 07/24/2021 Lumbar spine: Compared to prior films from 2020.  There is been no interval change in the overall anterior spinal listhesis at L4-5 which remains grade 1.  Scoliosis remains virtually unchanged.  No acute fractures or findings.    PMFS History: Patient Active Problem List   Diagnosis Date Noted   Type 2 DM with CKD stage 3 and hypertension ([facility])  07/12/2020   Chronic kidney disease 05/07/2018   GERD (gastroesophageal reflux disease) 03/10/2017   Uses hearing aid 09/12/2016   Asthmatic bronchitis 03/11/2016   Chronic diastolic heart failure ([facility]) 10/12/2015   Seasonal allergies 11/21/2014   DDD (degenerative disc disease), lumbar 10/11/2014   H/O compression fracture of spine 10/11/2014   Pulmonary hypertension ([facility])    Hepatic cirrhosis ([place]) 09/24/2014   SIADH (syndrome of inappropriate ADH production) ([facility]) 06/13/2014   Insomnia 12/28/2013   Anemia 07/06/2013   HLD (hyperlipidemia) 06/27/2013   Osteoporosis 07/26/2012   Hyponatremia 05/16/2011   Type 2 diabetes mellitus with diabetic chronic kidney disease ([facility]) 08/17/2006   Essential hypertension 08/17/2006   DIVERTICULOSIS, COLON 08/17/2006   Past Medical History:  Diagnosis Date   Arthritis    Asthma    Per new patient form   CHF (congestive heart failure) ([facility])    diastolic   Chronic kidney disease    hx of kidney stones,    Depression    hx of    Diabetes mellitus without complication ([facility])    Ganglion, left ankle and foot    Gout    Per new patient form   Hearing loss    Per new patient form   Hearing loss of both ears    Hypertension    MRSA infection    Oct 13 - Nov 13   Osteoporosis    Tinnitus of both ears     Family History  Problem Relation Age of Onset   Hypertension Mother    Colon cancer Neg Hx    Breast cancer Neg Hx     Past Surgical History:  Procedure Laterality Date   INCISION AND DRAINAGE HIP  12/22/2011   Procedure: IRRIGATION AND DEBRIDEMENT HIP;  Surgeon: Mcarthur Rossetti, MD;  Location: [facility];  Service: Orthopedics;  Laterality: Right;  Irrigation and debridement right hip   INCISION AND DRAINAGE HIP  12/27/2011   Procedure: IRRIGATION AND DEBRIDEMENT HIP;  Surgeon: Mcarthur Rossetti, MD;  Location: [facility];  Service: Orthopedics;  Laterality: Right;  Repeat irrigation and debridement   JOINT REPLACEMENT      bilateral knee, right hip    MASS EXCISION N/A 01/03/2020   Procedure: EXCISION OF HEMANGIOMA OF THE PALATE;  Surgeon: Leta Baptist, MD;  Location: [facility];  Service: ENT;  Laterality: N/A;   TOTAL HIP REVISION  12/05/2011   Procedure: TOTAL HIP REVISION;  Surgeon: Mcarthur Rossetti, MD;  Location[facility];  Service: Orthopedics;  Laterality: Right;  Right Hip Revision Arthroplasty to Total Hip, Excision of Old Implant   Social History   Occupational History   Not on file  Tobacco Use  Smoking status: Never   Smokeless tobacco: Never  Vaping Use   Vaping Use: Never used  Substance and Sexual Activity   Alcohol use: No    Alcohol/week: 0.0 standard drinks   Drug use: No   Sexual activity: Not Currently

## 2021-07-24 NOTE — Addendum Note (Signed)
Addended by: Robyne Peers on: 07/24/2021 04:06 PM   Modules accepted: Orders

## 2021-07-26 DIAGNOSIS — M79605 Pain in left leg: Secondary | ICD-10-CM | POA: Diagnosis not present

## 2021-07-26 DIAGNOSIS — M5136 Other intervertebral disc degeneration, lumbar region: Secondary | ICD-10-CM | POA: Diagnosis not present

## 2021-07-26 DIAGNOSIS — M25552 Pain in left hip: Secondary | ICD-10-CM | POA: Diagnosis not present

## 2021-07-30 ENCOUNTER — Ambulatory Visit: Payer: Medicaid Other | Admitting: Skilled Nursing Facility1

## 2021-07-30 DIAGNOSIS — M25552 Pain in left hip: Secondary | ICD-10-CM | POA: Diagnosis not present

## 2021-07-30 DIAGNOSIS — M25571 Pain in right ankle and joints of right foot: Secondary | ICD-10-CM | POA: Diagnosis not present

## 2021-07-31 ENCOUNTER — Ambulatory Visit: Payer: Self-pay

## 2021-07-31 ENCOUNTER — Ambulatory Visit: Payer: Medicaid Other | Admitting: Physician Assistant

## 2021-07-31 ENCOUNTER — Encounter: Payer: Self-pay | Admitting: Physical Medicine and Rehabilitation

## 2021-07-31 ENCOUNTER — Ambulatory Visit (INDEPENDENT_AMBULATORY_CARE_PROVIDER_SITE_OTHER): Payer: Medicaid Other | Admitting: Physical Medicine and Rehabilitation

## 2021-07-31 DIAGNOSIS — M5416 Radiculopathy, lumbar region: Secondary | ICD-10-CM

## 2021-07-31 MED ORDER — METHYLPREDNISOLONE ACETATE 80 MG/ML IJ SUSP
80.0000 mg | Freq: Once | INTRAMUSCULAR | Status: AC
  Administered 2021-07-31: 80 mg

## 2021-07-31 NOTE — Progress Notes (Unsigned)
Pt states lower back pain that travels down her left leg. Pt state walking and standing makes the pain worse. Pt state she takes over the counter pain meds to help ease her pain.  Numeric Pain Rating Scale and Functional Assessment Average Pain 8   In the last MONTH (on 0-10 scale) has pain interfered with the following?  1. General activity like being  able to carry out your everyday physical activities such as walking, climbing stairs, carrying groceries, or moving a chair?  Rating(10)   +Driver, -BT, -Dye Allergies.

## 2021-07-31 NOTE — Patient Instructions (Signed)

## 2021-08-01 ENCOUNTER — Other Ambulatory Visit: Payer: Self-pay | Admitting: Orthopaedic Surgery

## 2021-08-01 ENCOUNTER — Telehealth: Payer: Self-pay

## 2021-08-01 MED ORDER — HYDROCODONE-ACETAMINOPHEN 5-325 MG PO TABS: 1.0000 | ORAL_TABLET | ORAL | 0 refills | Status: DC | PRN

## 2021-08-01 NOTE — Telephone Encounter (Signed)
Patient would like a Rx refill on Hydrocodone?  Will be out by the weekend and would like to know if she can take it sooner than 6hrs due to the pain and injection has not helped.  Please advise.  Thank you. CB# [phone].

## 2021-08-01 NOTE — Telephone Encounter (Signed)
Please advise 

## 2021-08-06 ENCOUNTER — Ambulatory Visit: Payer: Medicaid Other | Attending: Orthopaedic Surgery

## 2021-08-06 ENCOUNTER — Other Ambulatory Visit: Payer: Self-pay

## 2021-08-06 DIAGNOSIS — M6281 Muscle weakness (generalized): Secondary | ICD-10-CM | POA: Diagnosis not present

## 2021-08-06 DIAGNOSIS — R252 Cramp and spasm: Secondary | ICD-10-CM | POA: Diagnosis not present

## 2021-08-06 DIAGNOSIS — M5136 Other intervertebral disc degeneration, lumbar region: Secondary | ICD-10-CM | POA: Diagnosis not present

## 2021-08-06 DIAGNOSIS — M5442 Lumbago with sciatica, left side: Secondary | ICD-10-CM | POA: Insufficient documentation

## 2021-08-06 NOTE — Therapy (Signed)
OUTPATIENT PHYSICAL THERAPY THORACOLUMBAR EVALUATION   Patient Name: Joyce Gilmore MRN: [id number] DOB:1934/05/09, 87 y.o., female Today's Date: 08/07/2021   PT End of Session - 08/07/21 0732     Visit Number 1    Number of Visits 9    Date for PT Re-Evaluation 09/11/21    Authorization Type [place] MEDICAID HEALTHY BLUE    PT Start Time 1130    PT Stop Time 1220    PT Time Calculation (min) 50 min    Activity Tolerance Patient tolerated treatment well    Behavior During Therapy WFL for tasks assessed/performed             Past Medical History:  Diagnosis Date   Arthritis    Asthma    Per new patient form   CHF (congestive heart failure) ([facility])    diastolic   Chronic kidney disease    hx of kidney stones,    Depression    hx of    Diabetes mellitus without complication ([facility])    Ganglion, left ankle and foot    Gout    Per new patient form   Hearing loss    Per new patient form   Hearing loss of both ears    Hypertension    MRSA infection    Oct 13 - Nov 13   Osteoporosis    Tinnitus of both ears    Past Surgical History:  Procedure Laterality Date   INCISION AND DRAINAGE HIP  12/22/2011   Procedure: IRRIGATION AND DEBRIDEMENT HIP;  Surgeon: Mcarthur Rossetti, MD;  Location: [facility];  Service: Orthopedics;  Laterality: Right;  Irrigation and debridement right hip   INCISION AND DRAINAGE HIP  12/27/2011   Procedure: IRRIGATION AND DEBRIDEMENT HIP;  Surgeon: Mcarthur Rossetti, MD;  Location: [facility];  Service: Orthopedics;  Laterality: Right;  Repeat irrigation and debridement   JOINT REPLACEMENT     bilateral knee, right hip    MASS EXCISION N/A 01/03/2020   Procedure: EXCISION OF HEMANGIOMA OF THE PALATE;  Surgeon: Leta Baptist, MD;  Location: [facility];  Service: ENT;  Laterality: N/A;   TOTAL HIP REVISION  12/05/2011   Procedure: TOTAL HIP REVISION;  Surgeon: Mcarthur Rossetti, MD;  [facility];  Service: Orthopedics;   Laterality: Right;  Right Hip Revision Arthroplasty to Total Hip, Excision of Old Implant   Patient Active Problem List   Diagnosis Date Noted   Type 2 DM with CKD stage 3 and hypertension ([facility]) 07/12/2020   Chronic kidney disease 05/07/2018   GERD (gastroesophageal reflux disease) 03/10/2017   Uses hearing aid 09/12/2016   Asthmatic bronchitis 03/11/2016   Chronic diastolic heart failure ([facility]) 10/12/2015   Seasonal allergies 11/21/2014   DDD (degenerative disc disease), lumbar 10/11/2014   H/O compression fracture of spine 10/11/2014   Pulmonary hypertension ([facility])    Hepatic cirrhosis ([place]) 09/24/2014   SIADH (syndrome of inappropriate ADH production) ([facility]) 06/13/2014   Insomnia 12/28/2013   Anemia 07/06/2013   HLD (hyperlipidemia) 06/27/2013   Osteoporosis 07/26/2012   Hyponatremia 05/16/2011   Type 2 diabetes mellitus with diabetic chronic kidney disease ([facility]) 08/17/2006   Essential hypertension 08/17/2006   DIVERTICULOSIS, COLON 08/17/2006    PCP: Yvonna Alanis, NP  REFERRING PROVIDER: Mcarthur Rossetti, MD  REFERRING DIAG: DDD (degenerative disc disease), lumbar  Rationale for Evaluation and Treatment Rehabilitation  THERAPY DIAG:  Left-sided low back pain with left-sided sciatica, unspecified chronicity  Muscle weakness (generalized)  Cramp and spasm  ONSET DATE: 1.5 months ago  SUBJECTIVE:                                                                                                                                                                                           SUBJECTIVE STATEMENT: Pt reports she has had 2 falls approx 1.5 months ago, after which she had had an increase in L low back pain radiating into her post/lat L LE along with numbess. Pt has received an injection to the low back which helped to decrease the pain for a day  PERTINENT HISTORY:  CHF, DM2, asthma, DDD, grade 1 listhesis, scoliosis, obesity  PAIN:  Are you having pain? Yes:  NPRS scale: 8/10 Pain location: L low back and LE Pain description: ache , throb, sharp Aggravating factors: Standing and walking  Pain range: 5-9/10           Relieves pain: rest and hydrocodone   PRECAUTIONS: None  WEIGHT BEARING RESTRICTIONS No  FALLS:  Has patient fallen in last 6 months? Yes. Number of falls 2  Now using a RW. Previously usig a SPC or no AD.  LIVING ENVIRONMENT: Lives with: lives with their family Lives in: House/apartment Needs A to access home and for mobility within home  OCCUPATION: Retired  PLOF: Independent with household mobility with device  PATIENT GOALS To have less pain and to get around better   OBJECTIVE:   DIAGNOSTIC FINDINGS:  XR HIP UNILAT W OR W/O PELVIS 1V LEFT   Result Date: 07/24/2021 AP pelvis lateral view left hip: Status post right total hip arthroplasty with well-seated components.  Left hip is well located.  No acute fractures or acute findings throughout the the pelvis and left hip.   XR Lumbar Spine 2-3 Views   Result Date: 07/24/2021 Lumbar spine: Compared to prior films from 2020.  There is been no interval change in the overall anterior spinal listhesis at L4-5 which remains grade 1.  Scoliosis remains virtually unchanged.  No acute fractures or findings.   PATIENT SURVEYS:  Modified Oswestry 68%   SCREENING FOR RED FLAGS: Bowel or bladder incontinence: No Spinal tumors: No Cauda equina syndrome: No Compression fracture: No  COGNITION:  Overall cognitive status: Within functional limits for tasks assessed     SENSATION: Not tested  MUSCLE LENGTH: Hamstrings: Right WNLs deg; Left WNLs deg Marcello Moores test: Right WNLs deg; Left WNLs deg  POSTURE: rounded shoulders, forward head, increased lumbar lordosis, and increased thoracic kyphosis  PALPATION: TTP to the lumbar paraspinals  LUMBAR ROM:  Not assessed due to degree of pain and limited tolerance to  mobility Active  A/PROM  eval  Flexion   Extension    Right lateral flexion   Left lateral flexion   Right rotation   Left rotation    (Blank rows = not tested)  LOWER EXTREMITY ROM:      Grossly WNLs Active  Right eval Left eval  Hip flexion    Hip extension    Hip abduction    Hip adduction    Hip internal rotation    Hip external rotation    Knee flexion    Knee extension    Ankle dorsiflexion    Ankle plantarflexion    Ankle inversion    Ankle eversion     (Blank rows = not tested)  LOWER EXTREMITY MMT:     LE myotome screen was negative. Poor core strength. MMT Right eval Left eval  Hip flexion    Hip extension    Hip abduction    Hip adduction    Hip internal rotation    Hip external rotation    Knee flexion    Knee extension    Ankle dorsiflexion    Ankle plantarflexion    Ankle inversion    Ankle eversion     (Blank rows = not tested)  LUMBAR SPECIAL TESTS:  Straight leg raise test: Negative and Slump test: Negative  FUNCTIONAL TESTS:  Not tested  GAIT: Distance walked: 129f Assistive device utilized: WEnvironmental consultant- 2 wheeled Level of assistance: SBA Comments: Antalgic gait pattern over the L LE, step to pattern with R LE    TODAY'S TREATMENT  Supine Lower Trunk Rotation 10 reps - 5 hold Supine March 10 reps - 1 hold   PATIENT EDUCATION:  Education details: Eval finding, POC, HEP, positions for comfort, use of heat or cold packs for pain management Person educated: Patient Education method: Explanation, Demonstration, Tactile cues, Verbal cues, and Handouts Education comprehension: verbalized understanding, returned demonstration, verbal cues required, and tactile cues required   HOME EXERCISE PROGRAM: Access Code: [email or web address]: [email or web address] Date: 08/07/2021 Prepared by: AGar Ponto Exercises - Supine Lower Trunk Rotation  - 3 x daily - 7 x weekly - 1 sets - 10 reps - 5 hold - Supine March  - 3 x daily - 7 x weekly - 1 sets - 10 reps - 1  hold  ASSESSMENT:  CLINICAL IMPRESSION: Patient is a 87y.o. F who was seen today for physical therapy evaluation and treatment for  DDD, lumbar; anterior spinal listhesis at L4-5 which remains grade 1; l low back pain c L LE sciatica. Pt's tolerance to PT assessment was limited by pain.Gentle back mobility therex was initiated. Pt reported min improvement in pain afterward.  OBJECTIVE IMPAIRMENTS Abnormal gait, decreased balance, decreased endurance, decreased mobility, difficulty walking, decreased ROM, decreased strength, obesity, and pain.   ACTIVITY LIMITATIONS carrying, lifting, bending, standing, squatting, sleeping, stairs, transfers, bed mobility, and locomotion level  PARTICIPATION LIMITATIONS: meal prep, cleaning, laundry, and shopping  PERSONAL FACTORS Age, Past/current experiences, and 3+ comorbidities:    CHF, DM2, asthma, DDD, grade 1 listhesis, scoliosis, obesity are also affecting patient's functional outcome.   REHAB POTENTIAL: Fair related to degenerative changes  of low back  CLINICAL DECISION MAKING: Evolving/moderate complexity  EVALUATION COMPLEXITY: Moderate   GOALS:  LONG TERM GOALS: Target date:   Pt will report a decrease in L low back and LE pain to 4/10 or less with daily activities Baseline: 5-9/10 Goal status: INITIAL  2.  Pt will return to walking  Indly c a LRAD with an improved gait pattern, step tthrough with the R LE Baseline: SBA for safety  Goal status: INITIAL  3.  Pt will voice understanding of measures to assist with the reduction of pain Baseline:  Goal status: INITIAL  4.  Pt's mod Oswestry will improve by the MCID to 55% as indication of improved function Baseline:  Goal status: INITIAL  5.  Pt will be Ind in a final HEP to maintain achieved LOF Baseline:  Goal status: INITIAL    PLAN: PT FREQUENCY: 2x/week  PT DURATION: 4 weeks  PLANNED INTERVENTIONS: Therapeutic exercises, Therapeutic activity, Balance training, Gait  training, Patient/Family education, Joint mobilization, Aquatic Therapy, Dry Needling, Electrical stimulation, Spinal mobilization, Cryotherapy, Moist heat, Taping, Traction, Ultrasound, Ionotophoresis '4mg'$ /ml Dexamethasone, and Manual therapy.  PLAN FOR NEXT SESSION: Assess response to HEP; use of modalities, manual therapy, TPDN as inidcated.   Alfie Alderfer MS, PT 08/07/21 8:21 AM  Check all possible CPT codes: 16109 - Re-evaluation, 60454- Therapeutic Exercise, [phone] - Gait Training, [phone] - Manual Therapy, 97530 - Therapeutic Activities, 29562 - Self Care, [phone] - Electrical stimulation (Manual), [id number] - Ultrasound, and [id number] - Aquatic therapy     If treatment provided at initial evaluation, no treatment charged due to lack of authorization.

## 2021-08-07 ENCOUNTER — Other Ambulatory Visit: Payer: Self-pay

## 2021-08-07 ENCOUNTER — Encounter: Payer: Self-pay | Admitting: Skilled Nursing Facility1

## 2021-08-07 ENCOUNTER — Telehealth: Payer: Self-pay

## 2021-08-07 ENCOUNTER — Encounter: Payer: Medicaid Other | Attending: Nurse Practitioner | Admitting: Skilled Nursing Facility1

## 2021-08-07 DIAGNOSIS — M79605 Pain in left leg: Secondary | ICD-10-CM

## 2021-08-07 DIAGNOSIS — M5136 Other intervertebral disc degeneration, lumbar region: Secondary | ICD-10-CM

## 2021-08-07 DIAGNOSIS — N1831 Chronic kidney disease, stage 3a: Secondary | ICD-10-CM | POA: Diagnosis not present

## 2021-08-07 DIAGNOSIS — E1122 Type 2 diabetes mellitus with diabetic chronic kidney disease: Secondary | ICD-10-CM | POA: Diagnosis not present

## 2021-08-07 DIAGNOSIS — N182 Chronic kidney disease, stage 2 (mild): Secondary | ICD-10-CM | POA: Insufficient documentation

## 2021-08-07 DIAGNOSIS — M5416 Radiculopathy, lumbar region: Secondary | ICD-10-CM

## 2021-08-07 DIAGNOSIS — Z794 Long term (current) use of insulin: Secondary | ICD-10-CM | POA: Insufficient documentation

## 2021-08-07 NOTE — Progress Notes (Signed)
Diabetes Self-Management Education  Visit Type: First/Initial   08/07/2021  Joyce Gilmore, identified by name and date of birth, is a 87 y.o. female with a diagnosis of Diabetes: Type 2.   ASSESSMENT  There were no vitals taken for this visit. There is no height or weight on file to calculate BMI.   Pt arrives with her granddaughter Kathlee Nations who is interpreting for her whom takes care of her in the morning, her daughter cares for her around 80 and lives with her mom and her sons also care for her.   Pts granddaughter states her insulin is taken care of by her daughter.   Pt checks her own blood sugars: 98 this morning with having drank hot tea with  nothing in it, sometimes checking 2 times a day after eating 124  Pt states she never uses the same needle more than once.   Pts granddaughter states the pt is in PT.   Pt states she loves hot drinks. Pts granddaughter states she Does not know what the pt eats.   Advised pts granddaughter if her family has any questions please call or email.   Labs:  A1C 7.1 GFR 54 Na 133 Potassium 5.4 BUN 31  Creatinine 1.03   DM medications: insulin aspart (NOVOLOG FLEXPEN) 100 UNIT/ML FlexPen; ADMINISTER 5 TO 15 UNITS UNDER THE SKIN THREE TIMES DAILY WITH MEALS PER SLIDING SCALE  Dispense: 15 mL; Refill: 1 - insulin glargine (LANTUS SOLOSTAR) 100 UNIT/ML Solostar Pen; INJECT  38 UNITS UNDER THE SKIN AT BEDTIME  Dispense: 15 mL; Refill: 5   Diabetes Self-Management Education - 08/07/21 1058       Visit Information   Visit Type First/Initial      Initial Visit   Diabetes Type Type 2    Date Diagnosed 2008    Are you currently following a meal plan? No    Are you taking your medications as prescribed? Yes      Health Coping   How would you rate your overall health? Fair      Psychosocial Assessment   Patient Belief/Attitude about Diabetes Motivated to manage diabetes    What is the hardest part about your diabetes right now,  causing you the most concern, or is the most worrisome to you about your diabetes?   Making healty food and beverage choices    Self-care barriers None    Self-management support Family    Other persons present Family Member    Patient Concerns Nutrition/Meal planning    Special Needs Large print;Simplified materials;Verbal instruction;Instruct caregiver    Preferred Learning Style Hands on    Learning Readiness Change in progress    How often do you need to have someone help you when you read instructions, pamphlets, or other written materials from your doctor or pharmacy? 1 - Never      Pre-Education Assessment   Patient understands the diabetes disease and treatment process. Needs Instruction    Patient understands incorporating nutritional management into lifestyle. Needs Instruction    Patient undertands incorporating physical activity into lifestyle. Needs Instruction    Patient understands using medications safely. Needs Instruction    Patient understands monitoring blood glucose, interpreting and using results Needs Instruction    Patient understands prevention, detection, and treatment of acute complications. Needs Instruction    Patient understands prevention, detection, and treatment of chronic complications. Needs Instruction    Patient understands how to develop strategies to address psychosocial issues. Needs Instruction  Patient understands how to develop strategies to promote health/change behavior. Needs Instruction      Complications   How often do you check your blood sugar? 1-2 times/day    Fasting Blood glucose range (mg/dL) 130-179    Postprandial Blood glucose range (mg/dL) 130-179    Number of hyperglycemic episodes ( >'200mg'$ /dL): Rare    Can you tell when your blood sugar is high? No    Have you had a dilated eye exam in the past 12 months? Yes    Have you had a dental exam in the past 12 months? Yes    Are you checking your feet? Yes    How many days per week  are you checking your feet? 7      Activity / Exercise   Activity / Exercise Type ADL's    How many days per week do you exercise? 0    How many minutes per day do you exercise? 0    Total minutes per week of exercise 0      Patient Education   Previous Diabetes Education No    Healthy Eating Role of diet in the treatment of diabetes and the relationship between the three main macronutrients and blood glucose level;Food label reading, portion sizes and measuring food.;Plate Method;Meal options for control of blood glucose level and chronic complications.    Being Active Role of exercise on diabetes management, blood pressure control and cardiac health.    Medications Taught/reviewed insulin/injectables, injection, site rotation, insulin/injectables storage and needle disposal.;Reviewed patients medication for diabetes, action, purpose, timing of dose and side effects.    Monitoring Taught/evaluated SMBG meter.;Purpose and frequency of SMBG.;Interpreting lab values - A1C, lipid, urine microalbumina.;Yearly dilated eye exam;Daily foot exams    Acute complications Taught prevention, symptoms, and  treatment of hypoglycemia - the 15 rule.;Discussed and identified patients' prevention, symptoms, and treatment of hyperglycemia.    Chronic complications Relationship between chronic complications and blood glucose control;Retinopathy and reason for yearly dilated eye exams;Nephropathy, what it is, prevention of, the use of ACE, ARB's and early detection of through urine microalbumia.      Individualized Goals (developed by patient)   Nutrition Follow meal plan discussed;General guidelines for healthy choices and portions discussed    Problem Solving Eating Pattern      Post-Education Assessment   Patient understands the diabetes disease and treatment process. Comprehends key points    Patient understands incorporating nutritional management into lifestyle. Comprehends key points    Patient undertands  incorporating physical activity into lifestyle. Comprehends key points    Patient understands using medications safely. Comphrehends key points    Patient understands monitoring blood glucose, interpreting and using results Comprehends key points    Patient understands prevention, detection, and treatment of acute complications. Comprehends key points    Patient understands prevention, detection, and treatment of chronic complications. Comprehends key points    Patient understands how to develop strategies to address psychosocial issues. Comprehends key points    Patient understands how to develop strategies to promote health/change behavior. Comprehends key points      Outcomes   Expected Outcomes Demonstrated interest in learning but significant barriers to change    Future DMSE PRN    Program Status Completed             Individualized Plan for Diabetes Self-Management Training:   Learning Objective:  Patient will have a greater understanding of diabetes self-management. Patient education plan is to attend individual and/or group sessions per  assessed needs and concerns.   Expected Outcomes:  Demonstrated interest in learning but significant barriers to change  Education material provided: ADA - How to Thrive: A Guide for Your Journey with Diabetes, Food label handouts, Meal plan card, My Plate, and Snack sheet All in spanish  If problems or questions, patient to contact team via:  Phone and Email  Future DSME appointment: PRN

## 2021-08-07 NOTE — Telephone Encounter (Signed)
ordered

## 2021-08-07 NOTE — Telephone Encounter (Signed)
Granddaughter called states patient is still in so much pain, should we get MRI next?

## 2021-08-10 ENCOUNTER — Other Ambulatory Visit: Payer: Medicaid Other

## 2021-08-13 NOTE — Therapy (Unsigned)
OUTPATIENT PHYSICAL THERAPY TREATMENT NOTE   Patient Name: Joyce Gilmore MRN: [id number] DOB:February 23, 1935, 87 y.o., female Today's Date: 08/14/2021  PCP: Yvonna Alanis NP REFERRING PROVIDER: Dr Jean Rosenthal  END OF SESSION:   PT End of Session - 08/14/21 0857     Visit Number 2    Number of Visits 9    Date for PT Re-Evaluation 09/11/21    Authorization Type [place] MEDICAID HEALTHY BLUE    PT Start Time [phone]    PT Stop Time 0930    PT Time Calculation (min) 40 min    Activity Tolerance Patient limited by pain    Behavior During Therapy [facility] for tasks assessed/performed;Anxious             Past Medical History:  Diagnosis Date   Arthritis    Asthma    Per new patient form   CHF (congestive heart failure) ([place])    diastolic   Chronic kidney disease    hx of kidney stones,    Depression    hx of    Diabetes mellitus without complication (HCC)    Ganglion, left ankle and foot    Gout    Per new patient form   Hearing loss    Per new patient form   Hearing loss of both ears    Hypertension    MRSA infection    Oct 13 - Nov 13   Osteoporosis    Tinnitus of both ears    Past Surgical History:  Procedure Laterality Date   INCISION AND DRAINAGE HIP  12/22/2011   Procedure: IRRIGATION AND DEBRIDEMENT HIP;  Surgeon: Mcarthur Rossetti, MD;  Location: [place];  Service: Orthopedics;  Laterality: Right;  Irrigation and debridement right hip   INCISION AND DRAINAGE HIP  12/27/2011   Procedure: IRRIGATION AND DEBRIDEMENT HIP;  Surgeon: Mcarthur Rossetti, MD;  Location: [place];  Service: Orthopedics;  Laterality: Right;  Repeat irrigation and debridement   JOINT REPLACEMENT     bilateral knee, right hip    MASS EXCISION N/A 01/03/2020   Procedure: EXCISION OF HEMANGIOMA OF THE PALATE;  Surgeon: Leta Baptist, MD;  Location: [place];  Service: ENT;  Laterality: N/A;   TOTAL HIP REVISION  12/05/2011   Procedure: TOTAL HIP REVISION;  Surgeon:  Mcarthur Rossetti, MD;  Location: [facility];  Service: Orthopedics;  Laterality: Right;  Right Hip Revision Arthroplasty to Total Hip, Excision of Old Implant   Patient Active Problem List   Diagnosis Date Noted   Type 2 DM with CKD stage 3 and hypertension ([place]) 07/12/2020   Chronic kidney disease 05/07/2018   GERD (gastroesophageal reflux disease) 03/10/2017   Uses hearing aid 09/12/2016   Asthmatic bronchitis 03/11/2016   Chronic diastolic heart failure ([place]) 10/12/2015   Seasonal allergies 11/21/2014   DDD (degenerative disc disease), lumbar 10/11/2014   H/O compression fracture of spine 10/11/2014   Pulmonary hypertension (HCC)    Hepatic cirrhosis ([place]) 09/24/2014   SIADH (syndrome of inappropriate ADH production) ([place]) 06/13/2014   Insomnia 12/28/2013   Anemia 07/06/2013   HLD (hyperlipidemia) 06/27/2013   Osteoporosis 07/26/2012   Hyponatremia 05/16/2011   Type 2 diabetes mellitus with diabetic chronic kidney disease ([place]) 08/17/2006   Essential hypertension 08/17/2006   DIVERTICULOSIS, COLON 08/17/2006    REFERRING DIAG: Lt side low back pain , L sciatica   THERAPY DIAG:  Left-sided low back pain with left-sided sciatica, unspecified chronicity  Muscle weakness (generalized)  Cramp  and spasm  Rationale for Evaluation and Treatment Rehabilitation  PERTINENT HISTORY: fell in [place] , [place] THA, see above   PRECAUTIONS: pain, balance issues, falls  SUBJECTIVE: Patient here with granddaughter who is translating.  Pain is 7/10.  LLE pain.   PAIN:  Are you having pain? Yes: NPRS scale: 7/10 Pain location: low back , L LE  Pain description: achy, sharp  Aggravating factors: standing, walking  Relieving factors: laying down, medicine   OBJECTIVE: (objective measures completed at initial evaluation unless otherwise dated) DIAGNOSTIC FINDINGS:  XR HIP UNILAT W OR W/O PELVIS 1V LEFT   Result Date: 07/24/2021 AP pelvis lateral view left hip: Status post right total  hip arthroplasty with well-seated components.  Left hip is well located.  No acute fractures or acute findings throughout the the pelvis and left hip.   XR Lumbar Spine 2-3 Views   Result Date: 07/24/2021 Lumbar spine: Compared to prior films from 2020.  There is been no interval change in the overall anterior spinal listhesis at L4-5 which remains grade 1.  Scoliosis remains virtually unchanged.  No acute fractures or findings.    PATIENT SURVEYS:  Modified Oswestry 68%    SCREENING FOR RED FLAGS: Bowel or bladder incontinence: No Spinal tumors: No Cauda equina syndrome: No Compression fracture: No   COGNITION:           Overall cognitive status: Within functional limits for tasks assessed                          SENSATION: Not tested   MUSCLE LENGTH: Hamstrings: Right WNLs deg; Left WNLs deg Marcello Moores test: Right WNLs deg; Left WNLs deg   POSTURE: rounded shoulders, forward head, increased lumbar lordosis, and increased thoracic kyphosis   PALPATION: TTP to the lumbar paraspinals   LUMBAR ROM:  Not assessed due to degree of pain and limited tolerance to mobility Active  A/PROM  eval  Flexion    Extension    Right lateral flexion    Left lateral flexion    Right rotation    Left rotation     (Blank rows = not tested)   LOWER EXTREMITY ROM:                          Grossly WNLs Active  Right eval Left eval  Hip flexion      Hip extension      Hip abduction      Hip adduction      Hip internal rotation      Hip external rotation      Knee flexion      Knee extension      Ankle dorsiflexion      Ankle plantarflexion      Ankle inversion      Ankle eversion       (Blank rows = not tested)   LOWER EXTREMITY MMT:                         LE myotome screen was negative. Poor core strength.  08/14/21: Difficulty assessing due to [employer] and language barrier  MMT Right eval Left eval  Hip flexion      Hip extension      Hip abduction      Hip adduction      Hip  internal rotation      Hip external rotation  Knee flexion      Knee extension      Ankle dorsiflexion      Ankle plantarflexion      Ankle inversion      Ankle eversion       (Blank rows = not tested)   LUMBAR SPECIAL TESTS:  Straight leg raise test: Negative and Slump test: Negative   FUNCTIONAL TESTS:  Not tested   GAIT: Distance walked: 164f Assistive device utilized: Walker - 2 wheeled Level of assistance: SBA Comments: Antalgic gait pattern over the L LE, step to pattern with R LE       TODAY'S TREATMENT   [facility] Adult PT Treatment:                                                DATE: 08/14/21 Therapeutic Exercise: Deep breathing 1 min , discussed body mechanics and bending knees to reduce lumbar sprain.  Supine lower trunk rotation x 10 Ball squeeze x 10  Knee to chest 30 sec x 2  March x 10  Seated cat and camel  x 8  Upper trunk rotation feet on step x 10  Hamstring stretch 30 sec LLE x 4  Manual Therapy: PROM bilateral hips,  LAD LLE x 5 Light palpation to L hip and Lumbar    PATIENT EDUCATION:  Education details: Eval finding, POC, HEP, positions for comfort, use of heat or cold packs for pain management Person educated: Patient Education method: Explanation, Demonstration, Tactile cues, Verbal cues, and Handouts Education comprehension: verbalized understanding, returned demonstration, verbal cues required, and tactile cues required     HOME EXERCISE PROGRAM:  Access Code: [email or web address]: [email or web address] Date: 08/14/2021 Prepared by: JRaeford Razor Exercises - Supine Lower Trunk Rotation  - 3 x daily - 7 x weekly - 1 sets - 10 reps - 5 hold - Supine March  - 3 x daily - 7 x weekly - 1 sets - 10 reps - 1 hold - Seated Cat Cow  - 2 x daily - 7 x weekly - 1 sets - 10 reps - 10 hold - Seated Hamstring Stretch  - 2 x daily - 7 x weekly - 1 sets - 5 reps - 30 hold - Diaphragmatic Breathing at 90/90 Supported  - 2 x daily - 7 x weekly  - 1 sets - 5 reps - 5-8 min  hold   ASSESSMENT:   CLINICAL IMPRESSION: Patient with barriers to session, including HOH and language barrier even with granddaughter interpreting. Unfortunately her MRI was cancelled as it was not authorized by insurance. Patient given techniques to transfer from sit to supine and back EOB. She continues to have severe pain, limiting all aspects of mobility but was bale to tolerate supine hooklying .  Cont POC.    OBJECTIVE IMPAIRMENTS Abnormal gait, decreased balance, decreased endurance, decreased mobility, difficulty walking, decreased ROM, decreased strength, obesity, and pain.    ACTIVITY LIMITATIONS carrying, lifting, bending, standing, squatting, sleeping, stairs, transfers, bed mobility, and locomotion level   PARTICIPATION LIMITATIONS: meal prep, cleaning, laundry, and shopping   PERSONAL FACTORS Age, Past/current experiences, and 3+ comorbidities:    CHF, DM2, asthma, DDD, grade 1 listhesis, scoliosis, obesity are also affecting patient's functional outcome.    REHAB POTENTIAL: Fair related to degenerative changes  of low back   CLINICAL DECISION MAKING: Evolving/moderate complexity  EVALUATION COMPLEXITY: Moderate     GOALS:   LONG TERM GOALS: Target date:    Pt will report a decrease in L low back and LE pain to 4/10 or less with daily activities Baseline: 5-9/10 Goal status: INITIAL   2.  Pt will return to walking Indly c a LRAD with an improved gait pattern, step tthrough with the R LE Baseline: SBA for safety  Goal status: INITIAL   3.  Pt will voice understanding of measures to assist with the reduction of pain Baseline:  Goal status: INITIAL   4.  Pt's mod Oswestry will improve by the MCID to 55% as indication of improved function Baseline:  Goal status: INITIAL   5.  Pt will be Ind in a final HEP to maintain achieved LOF Baseline:  Goal status: INITIAL       PLAN: PT FREQUENCY: 2x/week   PT DURATION: 4 weeks    PLANNED INTERVENTIONS: Therapeutic exercises, Therapeutic activity, Balance training, Gait training, Patient/Family education, Joint mobilization, Aquatic Therapy, Dry Needling, Electrical stimulation, Spinal mobilization, Cryotherapy, Moist heat, Taping, Traction, Ultrasound, Ionotophoresis '4mg'$ /ml Dexamethasone, and Manual therapy.   PLAN FOR NEXT SESSION:  Hooklying trunk.  Try NUstep.  2 min walk ?  Assess response to HEP; use of modalities, manual therapy, TPDN as inidcated.    Kristiann Noyce, PT 08/14/2021, 12:05 PM    Raeford Razor, PT 08/14/21 12:09 PM Phone: [phone] Fax: [phone]

## 2021-08-14 ENCOUNTER — Ambulatory Visit: Payer: Medicaid Other | Admitting: Physical Therapy

## 2021-08-14 ENCOUNTER — Encounter: Payer: Self-pay | Admitting: Physical Therapy

## 2021-08-14 DIAGNOSIS — R252 Cramp and spasm: Secondary | ICD-10-CM | POA: Diagnosis not present

## 2021-08-14 DIAGNOSIS — M5136 Other intervertebral disc degeneration, lumbar region: Secondary | ICD-10-CM | POA: Diagnosis not present

## 2021-08-14 DIAGNOSIS — M5442 Lumbago with sciatica, left side: Secondary | ICD-10-CM

## 2021-08-14 DIAGNOSIS — M6281 Muscle weakness (generalized): Secondary | ICD-10-CM

## 2021-08-15 ENCOUNTER — Telehealth: Payer: Self-pay

## 2021-08-15 ENCOUNTER — Other Ambulatory Visit: Payer: Self-pay | Admitting: Orthopaedic Surgery

## 2021-08-15 ENCOUNTER — Ambulatory Visit: Payer: Medicaid Other | Admitting: Dietician

## 2021-08-15 MED ORDER — HYDROCODONE-ACETAMINOPHEN 5-325 MG PO TABS: 1.0000 | ORAL_TABLET | Freq: Four times a day (QID) | ORAL | 0 refills | Status: DC | PRN

## 2021-08-15 NOTE — Telephone Encounter (Signed)
Patient would like a Rx refill on Hydrocodone sent to [facility] on [address].  Please advise.  Thank you.

## 2021-08-15 NOTE — Telephone Encounter (Signed)
Please advise 

## 2021-08-21 ENCOUNTER — Ambulatory Visit: Payer: Medicaid Other

## 2021-08-21 DIAGNOSIS — M5442 Lumbago with sciatica, left side: Secondary | ICD-10-CM | POA: Diagnosis not present

## 2021-08-21 DIAGNOSIS — M5136 Other intervertebral disc degeneration, lumbar region: Secondary | ICD-10-CM | POA: Diagnosis not present

## 2021-08-21 DIAGNOSIS — M6281 Muscle weakness (generalized): Secondary | ICD-10-CM

## 2021-08-21 DIAGNOSIS — R252 Cramp and spasm: Secondary | ICD-10-CM | POA: Diagnosis not present

## 2021-08-21 NOTE — Therapy (Addendum)
OUTPATIENT PHYSICAL THERAPY TREATMENT NOTE/Discharge   Patient Name: Joyce Gilmore MRN: [id number] DOB:04-16-1934, 87 y.o., female Today's Date: 08/21/2021  PCP: Yvonna Alanis NP REFERRING PROVIDER: Dr Jean Rosenthal  END OF SESSION:   PT End of Session - 08/21/21 2112     Visit Number 3    Number of Visits 9    Date for PT Re-Evaluation 09/11/21    Authorization Type [place] MEDICAID HEALTHY BLUE    Authorization Time Period Approved 4 visits 08/07/2021-09/05/2021 Auth#[id number]    Authorization - Visit Number 2    Authorization - Number of Visits 4    PT Start Time 1020    PT Stop Time 1104    PT Time Calculation (min) 44 min    Equipment Utilized During Treatment Other (comment)   RW   Activity Tolerance Patient limited by pain    Behavior During Therapy [facility] for tasks assessed/performed;Anxious              Past Medical History:  Diagnosis Date   Arthritis    Asthma    Per new patient form   CHF (congestive heart failure) ([place])    diastolic   Chronic kidney disease    hx of kidney stones,    Depression    hx of    Diabetes mellitus without complication (HCC)    Ganglion, left ankle and foot    Gout    Per new patient form   Hearing loss    Per new patient form   Hearing loss of both ears    Hypertension    MRSA infection    Oct 13 - Nov 13   Osteoporosis    Tinnitus of both ears    Past Surgical History:  Procedure Laterality Date   INCISION AND DRAINAGE HIP  12/22/2011   Procedure: IRRIGATION AND DEBRIDEMENT HIP;  Surgeon: Mcarthur Rossetti, MD;  Location: [place];  Service: Orthopedics;  Laterality: Right;  Irrigation and debridement right hip   INCISION AND DRAINAGE HIP  12/27/2011   Procedure: IRRIGATION AND DEBRIDEMENT HIP;  Surgeon: Mcarthur Rossetti, MD;  Location: [place];  Service: Orthopedics;  Laterality: Right;  Repeat irrigation and debridement   JOINT REPLACEMENT     bilateral knee, right hip    MASS EXCISION N/A  01/03/2020   Procedure: EXCISION OF HEMANGIOMA OF THE PALATE;  Surgeon: Leta Baptist, MD;  Location: [place];  Service: ENT;  Laterality: N/A;   TOTAL HIP REVISION  12/05/2011   Procedure: TOTAL HIP REVISION;  Surgeon: Mcarthur Rossetti, MD;  Location: [facility];  Service: Orthopedics;  Laterality: Right;  Right Hip Revision Arthroplasty to Total Hip, Excision of Old Implant   Patient Active Problem List   Diagnosis Date Noted   Type 2 DM with CKD stage 3 and hypertension ([place]) 07/12/2020   Chronic kidney disease 05/07/2018   GERD (gastroesophageal reflux disease) 03/10/2017   Uses hearing aid 09/12/2016   Asthmatic bronchitis 03/11/2016   Chronic diastolic heart failure ([place]) 10/12/2015   Seasonal allergies 11/21/2014   DDD (degenerative disc disease), lumbar 10/11/2014   H/O compression fracture of spine 10/11/2014   Pulmonary hypertension ([place])    Hepatic cirrhosis ([place]) 09/24/2014   SIADH (syndrome of inappropriate ADH production) ([place]) 06/13/2014   Insomnia 12/28/2013   Anemia 07/06/2013   HLD (hyperlipidemia) 06/27/2013   Osteoporosis 07/26/2012   Hyponatremia 05/16/2011   Type 2 diabetes mellitus with diabetic chronic kidney disease ([place]) 08/17/2006   Essential  hypertension 08/17/2006   DIVERTICULOSIS, COLON 08/17/2006    REFERRING DIAG: Lt side low back pain , L sciatica   THERAPY DIAG:  Left-sided low back pain with left-sided sciatica, unspecified chronicity  Muscle weakness (generalized)  Cramp and spasm  Rationale for Evaluation and Treatment Rehabilitation  PERTINENT HISTORY: fell [place], see above   PRECAUTIONS: pain, balance issues, falls  SUBJECTIVE: Pt reports her L sciatica varies.The pain is better with rest, and increases with walking or standing. Pt's son reports she is better up to go to the rest room on her own and being up on her feet a little more.  PAIN:  Are you having pain? Yes: NPRS scale: 7/10 Pain location: low  back , L LE  Pain description: achy, sharp  Aggravating factors: standing, walking  Relieving factors: laying down, medicine   OBJECTIVE: (objective measures completed at initial evaluation unless otherwise dated) DIAGNOSTIC FINDINGS:  XR HIP UNILAT W OR W/O PELVIS 1V LEFT   Result Date: 07/24/2021 AP pelvis lateral view left hip: Status post right total hip arthroplasty with well-seated components.  Left hip is well located.  No acute fractures or acute findings throughout the the pelvis and left hip.   XR Lumbar Spine 2-3 Views   Result Date: 07/24/2021 Lumbar spine: Compared to prior films from 2020.  There is been no interval change in the overall anterior spinal listhesis at L4-5 which remains grade 1.  Scoliosis remains virtually unchanged.  No acute fractures or findings.    PATIENT SURVEYS:  Modified Oswestry 68%    SCREENING FOR RED FLAGS: Bowel or bladder incontinence: No Spinal tumors: No Cauda equina syndrome: No Compression fracture: No   COGNITION:           Overall cognitive status: Within functional limits for tasks assessed                          SENSATION: Not tested   MUSCLE LENGTH: Hamstrings: Right WNLs deg; Left WNLs deg Marcello Moores test: Right WNLs deg; Left WNLs deg   POSTURE: rounded shoulders, forward head, increased lumbar lordosis, and increased thoracic kyphosis   PALPATION: TTP to the lumbar paraspinals   LUMBAR ROM:  Not assessed due to degree of pain and limited tolerance to mobility Active  A/PROM  eval  Flexion    Extension    Right lateral flexion    Left lateral flexion    Right rotation    Left rotation     (Blank rows = not tested)   LOWER EXTREMITY ROM:                          Grossly WNLs Active  Right eval Left eval  Hip flexion      Hip extension      Hip abduction      Hip adduction      Hip internal rotation      Hip external rotation      Knee flexion      Knee extension      Ankle dorsiflexion      Ankle  plantarflexion      Ankle inversion      Ankle eversion       (Blank rows = not tested)   LOWER EXTREMITY MMT:  LE myotome screen was negative. Poor core strength.  08/14/21: Difficulty assessing due to [facility] and language barrier  MMT Right eval Left eval  Hip flexion      Hip extension      Hip abduction      Hip adduction      Hip internal rotation      Hip external rotation      Knee flexion      Knee extension      Ankle dorsiflexion      Ankle plantarflexion      Ankle inversion      Ankle eversion       (Blank rows = not tested)   LUMBAR SPECIAL TESTS:  Straight leg raise test: Negative and Slump test: Negative   FUNCTIONAL TESTS:  Not tested   GAIT: Distance walked: 150f Assistive device utilized: WEnvironmental consultant- 2 wheeled Level of assistance: SBA Comments: Antalgic gait pattern over the L LE, step to pattern with R LE       TODAY'S TREATMENT  [facility] Adult PT Treatment:                                                DATE: 08/21/21 Therapeutic Exercise: Supine lower trunk rotation x 10 Ball squeeze x 10  Knee to chest 30 sec x 2  March x 10  Seated cat and camel  x 8  Hamstring stretch 30 sec LLE x 4, increased sciatic pain at the end range of the L LE hamstring stretch Manual Therapy: LAD to the L LE x10  [facility] Adult PT Treatment:                                                DATE: 08/14/21 Therapeutic Exercise: Deep breathing 1 min , discussed body mechanics and bending knees to reduce lumbar sprain.  Supine lower trunk rotation x 10 Ball squeeze x 10  Knee to chest 30 sec x 2  March x 10  Seated cat and camel  x 8  Upper trunk rotation feet on step x 10  Hamstring stretch 30 sec LLE x 4  Manual Therapy: PROM bilateral hips,  LAD LLE x 5 Light palpation to L hip and Lumbar    PATIENT EDUCATION:  Education details: Eval finding, POC, HEP, positions for comfort, use of heat or cold packs for pain management Person educated:  Patient Education method: Explanation, Demonstration, Tactile cues, Verbal cues, and Handouts Education comprehension: verbalized understanding, returned demonstration, verbal cues required, and tactile cues required     HOME EXERCISE PROGRAM:  Access Code: [email or web address]: [email or web address] Date: 08/14/2021 Prepared by: JRaeford Razor Exercises - Supine Lower Trunk Rotation  - 3 x daily - 7 x weekly - 1 sets - 10 reps - 5 hold - Supine March  - 3 x daily - 7 x weekly - 1 sets - 10 reps - 1 hold - Seated Cat Cow  - 2 x daily - 7 x weekly - 1 sets - 10 reps - 10 hold - Seated Hamstring Stretch  - 2 x daily - 7 x weekly - 1 sets - 5 reps - 30 hold - Diaphragmatic Breathing at 90/90 Supported  - 2 x  daily - 7 x weekly - 1 sets - 5 reps - 5-8 min  hold   ASSESSMENT:   CLINICAL IMPRESSION: PT was completed for lumbopelvic flexibility and strengthening as well as LAD of the L LE. Pt stated having min sciatica relief with the LAD. Family indicates pt has been more active getting up and walking more on her own indicating she is improving, however pt reports she is experiencing high levels of pain especially with walking. Pt reported increased sciatic pain at the end range of the hamstring stretch. Pt tolerated PT today without adverse effects.   OBJECTIVE IMPAIRMENTS Abnormal gait, decreased balance, decreased endurance, decreased mobility, difficulty walking, decreased ROM, decreased strength, obesity, and pain.      GOALS:   LONG TERM GOALS: Target date:    Pt will report a decrease in L low back and LE pain to 4/10 or less with daily activities Baseline: 5-9/10 Goal status: INITIAL   2.  Pt will return to walking Indly c a LRAD with an improved gait pattern, step tthrough with the R LE Baseline: SBA for safety  Goal status: INITIAL   3.  Pt will voice understanding of measures to assist with the reduction of pain Baseline:  Goal status: INITIAL   4.  Pt's mod  Oswestry will improve by the MCID to 55% as indication of improved function Baseline:  Goal status: INITIAL   5.  Pt will be Ind in a final HEP to maintain achieved LOF Baseline:  Goal status: INITIAL       PLAN: PT FREQUENCY: 2x/week   PT DURATION: 4 weeks   PLANNED INTERVENTIONS: Therapeutic exercises, Therapeutic activity, Balance training, Gait training, Patient/Family education, Joint mobilization, Aquatic Therapy, Dry Needling, Electrical stimulation, Spinal mobilization, Cryotherapy, Moist heat, Taping, Traction, Ultrasound, Ionotophoresis 61m/ml Dexamethasone, and Manual therapy.   PLAN FOR NEXT SESSION:  Hooklying trunk.  Try NUstep.  2 min walk ?  Assess response to HEP; use of modalities, manual therapy, TPDN as inidcated.    Jordan Pardini MS, PT 08/21/21 10:05 PM  PHYSICAL THERAPY DISCHARGE SUMMARY  Visits from Start of Care: 3  Current functional level related to goals / functional outcomes: See clinical impression and PT goals    Remaining deficits: See clinical impression and PT goals    Education / Equipment: HEP   Patient agrees to discharge. Patient goals were not met. Patient is being discharged due to not returning since the last visit.  Jencarlo Bonadonna MS, PT 01/24/22 7:40 AM

## 2021-08-22 ENCOUNTER — Ambulatory Visit: Payer: Medicaid Other | Admitting: Physical Therapy

## 2021-08-22 NOTE — Therapy (Deleted)
OUTPATIENT PHYSICAL THERAPY TREATMENT NOTE   Patient Name: Joyce Gilmore MRN: [id number] DOB:[birth year], 87 y.o., female Today's Date: 08/22/2021  PCP: Yvonna Alanis NP REFERRING PROVIDER: Dr Jean Rosenthal  END OF SESSION:      Past Medical History:  Diagnosis Date   Arthritis    Asthma    Per new patient form   CHF (congestive heart failure) ([place])    diastolic   Chronic kidney disease    hx of kidney stones,    Depression    hx of    Diabetes mellitus without complication ([place])    Ganglion, left ankle and foot    Gout    Per new patient form   Hearing loss    Per new patient form   Hearing loss of both ears    Hypertension    MRSA infection    Oct 13 - Nov 13   Osteoporosis    Tinnitus of both ears    Past Surgical History:  Procedure Laterality Date   INCISION AND DRAINAGE HIP  12/22/2011   Procedure: IRRIGATION AND DEBRIDEMENT HIP;  Surgeon: Mcarthur Rossetti, MD;  Location: [place];  Service: Orthopedics;  Laterality: Right;  Irrigation and debridement right hip   INCISION AND DRAINAGE HIP  12/27/2011   Procedure: IRRIGATION AND DEBRIDEMENT HIP;  Surgeon: Mcarthur Rossetti, MD;  Location: [place];  Service: Orthopedics;  Laterality: Right;  Repeat irrigation and debridement   JOINT REPLACEMENT     bilateral knee, right hip    MASS EXCISION N/A 01/03/2020   Procedure: EXCISION OF HEMANGIOMA OF THE PALATE;  Surgeon: Leta Baptist, MD;  Location: [place];  Service: ENT;  Laterality: N/A;   TOTAL HIP REVISION  12/05/2011   Procedure: TOTAL HIP REVISION;  Surgeon: Mcarthur Rossetti, MD;  Location: [facility];  Service: Orthopedics;  Laterality: Right;  Right Hip Revision Arthroplasty to Total Hip, Excision of Old Implant   Patient Active Problem List   Diagnosis Date Noted   Type 2 DM with CKD stage 3 and hypertension ([place]) 07/12/2020   Chronic kidney disease 05/07/2018   GERD (gastroesophageal reflux disease) 03/10/2017   Uses  hearing aid 09/12/2016   Asthmatic bronchitis 03/11/2016   Chronic diastolic heart failure ([place]) 10/12/2015   Seasonal allergies 11/21/2014   DDD (degenerative disc disease), lumbar 10/11/2014   H/O compression fracture of spine 10/11/2014   Pulmonary hypertension (HCC)    Hepatic cirrhosis ([place]) 09/24/2014   SIADH (syndrome of inappropriate ADH production) ([place]) 06/13/2014   Insomnia 12/28/2013   Anemia 07/06/2013   HLD (hyperlipidemia) 06/27/2013   Osteoporosis 07/26/2012   Hyponatremia 05/16/2011   Type 2 diabetes mellitus with diabetic chronic kidney disease ([place]) 08/17/2006   Essential hypertension 08/17/2006   DIVERTICULOSIS, COLON 08/17/2006    REFERRING DIAG: Lt side low back pain , L sciatica   THERAPY DIAG:  No diagnosis found.  Rationale for Evaluation and Treatment Rehabilitation  PERTINENT HISTORY: fell in [place] , [place] THA, see above   PRECAUTIONS: pain, balance issues, falls  SUBJECTIVE: Pt reports her L sciatica varies.The pain is better with rest, and increases with walking or standing. Pt's son reports she is better up to go to the rest room on her own and being up on her feet a little more.  PAIN:  Are you having pain? Yes: NPRS scale: 7/10 Pain location: low back , L LE  Pain description: achy, sharp  Aggravating factors: standing, walking  Relieving factors: laying  down, medicine   OBJECTIVE: (objective measures completed at initial evaluation unless otherwise dated) DIAGNOSTIC FINDINGS:  XR HIP UNILAT W OR W/O PELVIS 1V LEFT   Result Date: 07/24/2021 AP pelvis lateral view left hip: Status post right total hip arthroplasty with well-seated components.  Left hip is well located.  No acute fractures or acute findings throughout the the pelvis and left hip.   XR Lumbar Spine 2-3 Views   Result Date: 07/24/2021 Lumbar spine: Compared to prior films from 2020.  There is been no interval change in the overall anterior spinal listhesis at L4-5 which  remains grade 1.  Scoliosis remains virtually unchanged.  No acute fractures or findings.    PATIENT SURVEYS:  Modified Oswestry 68%    SCREENING FOR RED FLAGS: Bowel or bladder incontinence: No Spinal tumors: No Cauda equina syndrome: No Compression fracture: No   COGNITION:           Overall cognitive status: Within functional limits for tasks assessed                          SENSATION: Not tested   MUSCLE LENGTH: Hamstrings: Right WNLs deg; Left WNLs deg Marcello Moores test: Right WNLs deg; Left WNLs deg   POSTURE: rounded shoulders, forward head, increased lumbar lordosis, and increased thoracic kyphosis   PALPATION: TTP to the lumbar paraspinals   LUMBAR ROM:  Not assessed due to degree of pain and limited tolerance to mobility Active  A/PROM  eval  Flexion    Extension    Right lateral flexion    Left lateral flexion    Right rotation    Left rotation     (Blank rows = not tested)   LOWER EXTREMITY ROM:                          Grossly WNLs Active  Right eval Left eval  Hip flexion      Hip extension      Hip abduction      Hip adduction      Hip internal rotation      Hip external rotation      Knee flexion      Knee extension      Ankle dorsiflexion      Ankle plantarflexion      Ankle inversion      Ankle eversion       (Blank rows = not tested)   LOWER EXTREMITY MMT:                         LE myotome screen was negative. Poor core strength.  08/14/21: Difficulty assessing due to [facility] and language barrier  MMT Right eval Left eval  Hip flexion      Hip extension      Hip abduction      Hip adduction      Hip internal rotation      Hip external rotation      Knee flexion      Knee extension      Ankle dorsiflexion      Ankle plantarflexion      Ankle inversion      Ankle eversion       (Blank rows = not tested)   LUMBAR SPECIAL TESTS:  Straight leg raise test: Negative and Slump test: Negative   FUNCTIONAL TESTS:  Not tested  GAIT: Distance walked: 139f Assistive device utilized: WEnvironmental consultant- 2 wheeled Level of assistance: SBA Comments: Antalgic gait pattern over the L LE, step to pattern with R LE       TODAY'S TREATMENT     [facility] Adult PT Treatment:                                                DATE: 08/22/21 Therapeutic Exercise: *** Manual Therapy: *** Neuromuscular re-ed: *** Therapeutic Activity: *** Modalities: *** Self Care: ***  [facility]ult PT Treatment:                                                DATE: 08/21/21 Therapeutic Exercise: Supine lower trunk rotation x 10 Ball squeeze x 10  Knee to chest 30 sec x 2  March x 10  Seated cat and camel  x 8  Hamstring stretch 30 sec LLE x 4, increased sciatic pain at the end range of the L LE hamstring stretch Manual Therapy: LAD to the L LE x10  [facility] Adult PT Treatment:                                                DATE: 08/14/21 Therapeutic Exercise: Deep breathing 1 min , discussed body mechanics and bending knees to reduce lumbar sprain.  Supine lower trunk rotation x 10 Ball squeeze x 10  Knee to chest 30 sec x 2  March x 10  Seated cat and camel  x 8  Upper trunk rotation feet on step x 10  Hamstring stretch 30 sec LLE x 4  Manual Therapy: PROM bilateral hips,  LAD LLE x 5 Light palpation to L hip and Lumbar    PATIENT EDUCATION:  Education details: Eval finding, POC, HEP, positions for comfort, use of heat or cold packs for pain management Person educated: Patient Education method: Explanation, Demonstration, Tactile cues, Verbal cues, and Handouts Education comprehension: verbalized understanding, returned demonstration, verbal cues required, and tactile cues required     HOME EXERCISE PROGRAM:  Access Code: [email or web address]: [email or web address] Date: 08/14/2021 Prepared by: JRaeford Razor Exercises - Supine Lower Trunk Rotation  - 3 x daily - 7 x weekly - 1 sets - 10 reps - 5 hold - Supine March  - 3 x  daily - 7 x weekly - 1 sets - 10 reps - 1 hold - Seated Cat Cow  - 2 x daily - 7 x weekly - 1 sets - 10 reps - 10 hold - Seated Hamstring Stretch  - 2 x daily - 7 x weekly - 1 sets - 5 reps - 30 hold - Diaphragmatic Breathing at 90/90 Supported  - 2 x daily - 7 x weekly - 1 sets - 5 reps - 5-8 min  hold   ASSESSMENT:   CLINICAL IMPRESSION: PT was completed for lumbopelvic flexibility and strengthening as well as LAD of the L LE. Pt stated having min sciatica relief with the LAD. Family indicates pt has been more active getting up and walking more on her own indicating she is improving,  however pt reports she is experiencing high levels of pain especially with walking. Pt reported increased sciatic pain at the end range of the hamstring stretch. Pt tolerated PT today without adverse effects.   OBJECTIVE IMPAIRMENTS Abnormal gait, decreased balance, decreased endurance, decreased mobility, difficulty walking, decreased ROM, decreased strength, obesity, and pain.      GOALS:   LONG TERM GOALS: Target date:    Pt will report a decrease in L low back and LE pain to 4/10 or less with daily activities Baseline: 5-9/10 Goal status: INITIAL   2.  Pt will return to walking Indly c a LRAD with an improved gait pattern, step tthrough with the R LE Baseline: SBA for safety  Goal status: INITIAL   3.  Pt will voice understanding of measures to assist with the reduction of pain Baseline:  Goal status: INITIAL   4.  Pt's mod Oswestry will improve by the MCID to 55% as indication of improved function Baseline:  Goal status: INITIAL   5.  Pt will be Ind in a final HEP to maintain achieved LOF Baseline:  Goal status: INITIAL       PLAN: PT FREQUENCY: 2x/week   PT DURATION: 4 weeks   PLANNED INTERVENTIONS: Therapeutic exercises, Therapeutic activity, Balance training, Gait training, Patient/Family education, Joint mobilization, Aquatic Therapy, Dry Needling, Electrical stimulation, Spinal  mobilization, Cryotherapy, Moist heat, Taping, Traction, Ultrasound, Ionotophoresis '4mg'$ /ml Dexamethasone, and Manual therapy.   PLAN FOR NEXT SESSION:  Hooklying trunk.  Try NUstep.  2 min walk ?  Assess response to HEP; use of modalities, manual therapy, TPDN as inidcated.    Allen Ralls MS, PT 08/22/21 8:02 AM

## 2021-08-23 ENCOUNTER — Ambulatory Visit
Admission: RE | Admit: 2021-08-23 | Discharge: 2021-08-23 | Disposition: A | Discharge status: Home / Self Care | Payer: Medicaid Other | Source: Ambulatory Visit | Attending: Orthopaedic Surgery | Admitting: Orthopaedic Surgery

## 2021-08-23 DIAGNOSIS — M5416 Radiculopathy, lumbar region: Secondary | ICD-10-CM

## 2021-08-23 DIAGNOSIS — M5136 Other intervertebral disc degeneration, lumbar region: Secondary | ICD-10-CM

## 2021-08-23 DIAGNOSIS — M79605 Pain in left leg: Secondary | ICD-10-CM

## 2021-08-26 ENCOUNTER — Other Ambulatory Visit: Payer: Self-pay

## 2021-08-26 ENCOUNTER — Telehealth: Payer: Self-pay

## 2021-08-26 DIAGNOSIS — M5416 Radiculopathy, lumbar region: Secondary | ICD-10-CM

## 2021-08-26 NOTE — Telephone Encounter (Signed)
Called granddaughter and  informed

## 2021-08-26 NOTE — Telephone Encounter (Signed)
Per Artis Delay after reviewing MRI scan. He believes we should try a urgent Facet injection. This was ordered

## 2021-08-28 ENCOUNTER — Encounter: Payer: Self-pay | Admitting: Orthopedic Surgery

## 2021-08-28 ENCOUNTER — Ambulatory Visit: Payer: Medicaid Other | Admitting: Physical Therapy

## 2021-08-29 ENCOUNTER — Other Ambulatory Visit: Payer: Self-pay | Admitting: Physician Assistant

## 2021-08-29 ENCOUNTER — Encounter: Payer: Self-pay | Admitting: Orthopedic Surgery

## 2021-08-29 ENCOUNTER — Ambulatory Visit: Payer: Medicaid Other | Admitting: Orthopedic Surgery

## 2021-08-29 ENCOUNTER — Other Ambulatory Visit: Payer: Self-pay | Admitting: Orthopedic Surgery

## 2021-08-29 VITALS — BP 140/80 | HR 59 | Temp 97.3°F | Resp 18 | Ht <= 58 in | Wt 158.0 lb

## 2021-08-29 DIAGNOSIS — E1122 Type 2 diabetes mellitus with diabetic chronic kidney disease: Secondary | ICD-10-CM | POA: Diagnosis not present

## 2021-08-29 DIAGNOSIS — K219 Gastro-esophageal reflux disease without esophagitis: Secondary | ICD-10-CM

## 2021-08-29 DIAGNOSIS — E1169 Type 2 diabetes mellitus with other specified complication: Secondary | ICD-10-CM | POA: Diagnosis not present

## 2021-08-29 DIAGNOSIS — N182 Chronic kidney disease, stage 2 (mild): Secondary | ICD-10-CM | POA: Diagnosis not present

## 2021-08-29 DIAGNOSIS — Z6833 Body mass index (BMI) 33.0-33.9, adult: Secondary | ICD-10-CM | POA: Diagnosis not present

## 2021-08-29 DIAGNOSIS — R600 Localized edema: Secondary | ICD-10-CM

## 2021-08-29 DIAGNOSIS — I10 Essential (primary) hypertension: Secondary | ICD-10-CM | POA: Diagnosis not present

## 2021-08-29 DIAGNOSIS — Z794 Long term (current) use of insulin: Secondary | ICD-10-CM

## 2021-08-29 DIAGNOSIS — F325 Major depressive disorder, single episode, in full remission: Secondary | ICD-10-CM

## 2021-08-29 DIAGNOSIS — M5416 Radiculopathy, lumbar region: Secondary | ICD-10-CM | POA: Diagnosis not present

## 2021-08-29 DIAGNOSIS — E785 Hyperlipidemia, unspecified: Secondary | ICD-10-CM

## 2021-08-29 DIAGNOSIS — E7849 Other hyperlipidemia: Secondary | ICD-10-CM

## 2021-08-29 DIAGNOSIS — J4540 Moderate persistent asthma, uncomplicated: Secondary | ICD-10-CM | POA: Diagnosis not present

## 2021-08-29 DIAGNOSIS — M109 Gout, unspecified: Secondary | ICD-10-CM

## 2021-08-29 DIAGNOSIS — E871 Hypo-osmolality and hyponatremia: Secondary | ICD-10-CM

## 2021-08-29 DIAGNOSIS — G8929 Other chronic pain: Secondary | ICD-10-CM

## 2021-08-29 MED ORDER — BUDESONIDE-FORMOTEROL FUMARATE 160-4.5 MCG/ACT IN AERO: 2.0000 | INHALATION_SPRAY | Freq: Two times a day (BID) | RESPIRATORY_TRACT | 0 refills | Status: DC

## 2021-08-29 MED ORDER — LIDOCAINE 4 % EX PTCH: 1.0000 | MEDICATED_PATCH | Freq: Two times a day (BID) | CUTANEOUS | 0 refills | Status: AC

## 2021-08-29 MED ORDER — PRAVASTATIN SODIUM 20 MG PO TABS: ORAL_TABLET | ORAL | 0 refills | Status: DC

## 2021-08-29 MED ORDER — LANTUS SOLOSTAR 100 UNIT/ML ~~LOC~~ SOPN: PEN_INJECTOR | SUBCUTANEOUS | 5 refills | Status: DC

## 2021-08-29 MED ORDER — AMLODIPINE BESYLATE 5 MG PO TABS: ORAL_TABLET | ORAL | 0 refills | Status: DC

## 2021-08-29 MED ORDER — LISINOPRIL 10 MG PO TABS: 10.0000 mg | ORAL_TABLET | Freq: Every day | ORAL | 0 refills | Status: DC

## 2021-08-29 MED ORDER — FUROSEMIDE 20 MG PO TABS: 20.0000 mg | ORAL_TABLET | Freq: Every day | ORAL | 0 refills | Status: DC

## 2021-08-29 MED ORDER — LANSOPRAZOLE 30 MG PO CPDR: DELAYED_RELEASE_CAPSULE | ORAL | 1 refills | Status: DC

## 2021-08-29 MED ORDER — METOPROLOL TARTRATE 25 MG PO TABS: ORAL_TABLET | ORAL | 0 refills | Status: DC

## 2021-08-29 NOTE — Progress Notes (Signed)
Careteam: Patient Care Team: Yvonna Alanis, NP as PCP - General (Adult Health Nurse Practitioner) Mcarthur Rossetti, MD as Consulting Physician (Orthopedic Surgery)  Seen by: Windell Moulding, AGNP-C  PLACE OF SERVICE:  [place] Directive information Does Patient Have a Medical Advance Directive?: No, Would patient like information on creating a medical advance directive?: No - Patient declined  Allergies  Allergen Reactions   Bactrim [Sulfamethoxazole-Trimethoprim] Other (See Comments)    Hyperkalemia (elevated potassium)   Rifampin Other (See Comments)    Severe thrombocytopenia (low blood platelet count)   Vancomycin Other (See Comments)    Severe thrombocytopenia (low blood platelet count)    Chief Complaint  Patient presents with   Medical Management of Chronic Issues    Patient is here for a 3 month DM II F/U, patient also needs PHQ9 done.     HPI: Patient is a 87 y.o. female seen today for medical management of chronic conditions.   Interpreter present during encounter.   Ongoing chronic back pain. Onset after fall at home about months ago. Evaluated/followed by orthopedics. MRI spine revealed subacute compression fracture L5, degenerative changes with marked stenosis L4-L5, foraminal stenosis L5-S1. She is doing PT. Ambulates with rolator. Still having increased pain with hydrocodone. No recent falls.   HTN- remains on lisinopril, metoprolol and amlodipine  Asthma- denies sob/wheezing, remains on Symbicort  T2DM- no hypoglycemic events, remains on SSI/Lantus/ACE  HLD- remains on pravastatin  Leg edema- remains on furosemide daily  Gout- no recent episodes, not on medication  GERD- remains on Prevacid and Carafate  Depression- in remission, past loss of grandchild, not on medication, no recent mood changes  Review of Systems:  Review of Systems  Constitutional:  Negative for chills, fever, malaise/fatigue and weight loss.  HENT:  Positive  for hearing loss. Negative for congestion and sore throat.   Eyes:  Negative for blurred vision and double vision.  Respiratory:  Negative for cough, shortness of breath and wheezing.   Cardiovascular:  Positive for leg swelling. Negative for chest pain.  Gastrointestinal:  Positive for heartburn. Negative for abdominal pain, blood in stool, constipation, diarrhea, nausea and vomiting.  Genitourinary:  Negative for dysuria and hematuria.  Musculoskeletal:  Positive for back pain, joint pain and myalgias. Negative for falls.  Skin:  Negative for rash.  Neurological:  Positive for weakness. Negative for dizziness and headaches.  Psychiatric/Behavioral:  Positive for depression. Negative for memory loss. The patient is not nervous/anxious and does not have insomnia.     Past Medical History:  Diagnosis Date   Arthritis    Asthma    Per new patient form   CHF (congestive heart failure) ([place])    diastolic   Chronic kidney disease    hx of kidney stones,    Depression    hx of    Diabetes mellitus without complication (HCC)    Ganglion, left ankle and foot    Gout    Per new patient form   Hearing loss    Per new patient form   Hearing loss of both ears    Hypertension    MRSA infection    Oct 13 - Nov 13   Osteoporosis    Tinnitus of both ears    Past Surgical History:  Procedure Laterality Date   INCISION AND DRAINAGE HIP  12/22/2011   Procedure: IRRIGATION AND DEBRIDEMENT HIP;  Surgeon: Mcarthur Rossetti, MD;  Location: [place];  Service: Orthopedics;  Laterality:  Right;  Irrigation and debridement right hip   INCISION AND DRAINAGE HIP  12/27/2011   Procedure: IRRIGATION AND DEBRIDEMENT HIP;  Surgeon: Mcarthur Rossetti, MD;  Location: [place];  Service: Orthopedics;  Laterality: Right;  Repeat irrigation and debridement   JOINT REPLACEMENT     bilateral knee, right hip    MASS EXCISION N/A 01/03/2020   Procedure: EXCISION OF HEMANGIOMA OF THE PALATE;  Surgeon: Leta Baptist,  MD;  Location: [place];  Service: ENT;  Laterality: N/A;   TOTAL HIP REVISION  12/05/2011   Procedure: TOTAL HIP REVISION;  Surgeon: Mcarthur Rossetti, MD;  Location: [facility];  Service: Orthopedics;  Laterality: Right;  Right Hip Revision Arthroplasty to Total Hip, Excision of Old Implant   Social History:   reports that she has never smoked. She has never used smokeless tobacco. She reports that she does not drink alcohol and does not use drugs.  Family History  Problem Relation Age of Onset   Hypertension Mother    Colon cancer Neg Hx    Breast cancer Neg Hx     Medications: Patient's Medications  New Prescriptions   No medications on file  Previous Medications   AMLODIPINE (NORVASC) 5 MG TABLET    TAKE 1 TABLET(5 MG) BY MOUTH DAILY   BUDESONIDE-FORMOTEROL (SYMBICORT) 160-4.5 MCG/ACT INHALER    Inhale 2 puffs into the lungs 2 (two) times daily.   FUROSEMIDE (LASIX) 20 MG TABLET    Take 1 tablet (20 mg total) by mouth daily.   GLUCOSE BLOOD TEST STRIP    Please dispense based on patient and insurance preference.  Use as directed to monitor FSBS four times daily.  Dx E11.22   HYDROCODONE-ACETAMINOPHEN (NORCO) 5-325 MG TABLET    Take 1 tablet by mouth every 6 (six) hours as needed for moderate pain.   INSULIN ASPART (NOVOLOG FLEXPEN) 100 UNIT/ML FLEXPEN    ADMINISTER 5 TO 15 UNITS UNDER THE SKIN THREE TIMES DAILY WITH MEALS PER SLIDING SCALE   INSULIN GLARGINE (LANTUS SOLOSTAR) 100 UNIT/ML SOLOSTAR PEN    INJECT  38 UNITS UNDER THE SKIN AT BEDTIME   LANCETS MISC    Please dispense based on patient and insurance preference.  Use as directed to monitor FSBS four times daily.  Dx E11.22   LANSOPRAZOLE (PREVACID) 30 MG CAPSULE    TAKE ONE CAPSULE BY MOUTH DAILY   LISINOPRIL (ZESTRIL) 10 MG TABLET    Take 1 tablet (10 mg total) by mouth daily.   METOPROLOL TARTRATE (LOPRESSOR) 25 MG TABLET    TAKE 1/2 TABLET(12.5 MG) BY MOUTH TWICE DAILY   PRAVASTATIN (PRAVACHOL) 20 MG  TABLET    TAKE 1 TABLET(20 MG) BY MOUTH DAILY   SUCRALFATE (CARAFATE) 1 G TABLET    TAKE 1 TABLET(1 GRAM) BY MOUTH FOUR TIMES DAILY  Modified Medications   No medications on file  Discontinued Medications   No medications on file    Physical Exam:  There were no vitals filed for this visit. There is no height or weight on file to calculate BMI. Wt Readings from Last 3 Encounters:  04/25/21 169 lb (76.7 kg)  03/25/21 163 lb (73.9 kg)  02/21/21 163 lb (73.9 kg)    Physical Exam Vitals reviewed.  Constitutional:      Appearance: She is obese.  HENT:     Head: Normocephalic.  Eyes:     General:        Right eye: No discharge.  Left eye: No discharge.  Cardiovascular:     Rate and Rhythm: Normal rate and regular rhythm.     Pulses: Normal pulses.     Heart sounds: Normal heart sounds.  Pulmonary:     Effort: Pulmonary effort is normal. No respiratory distress.     Breath sounds: Normal breath sounds. No wheezing.  Abdominal:     General: Bowel sounds are normal. There is no distension.     Palpations: Abdomen is soft.     Tenderness: There is no abdominal tenderness.  Musculoskeletal:     Cervical back: Normal and neck supple.     Thoracic back: Normal.     Lumbar back: Tenderness present. No swelling or deformity. Decreased range of motion.     Right lower leg: Edema present.     Left lower leg: Edema present.     Comments: Non pitting  Skin:    General: Skin is warm and dry.     Capillary Refill: Capillary refill takes less than 2 seconds.  Neurological:     General: No focal deficit present.     Mental Status: She is oriented to person, place, and time.     Motor: Weakness present.     Gait: Gait abnormal.     Comments: walker  Psychiatric:        Mood and Affect: Mood normal.        Behavior: Behavior normal.     Labs reviewed: Basic Metabolic Panel: Recent Labs    02/14/21 2315 05/01/21 0808 05/08/21 0940  NA 130* 134* 133*  K 4.5 5.4* 5.0   CL 95* 100 99  CO2 '25 26 26  '$ GLUCOSE 106* 84 84  BUN 20 18 31*  CREATININE 0.93 0.95 1.03*  CALCIUM 9.1 9.6 9.4   Liver Function Tests: Recent Labs    09/07/20 2034 09/14/20 1122 11/07/20 1508 05/01/21 0808  AST '24 20 17 18  '$ ALT '18 20 15 13  '$ ALKPHOS 80  --   --   --   BILITOT 0.3 0.3 0.3 0.5  PROT 7.1 6.5 7.0 6.6  ALBUMIN 3.9  --   --   --    No results for input(s): "LIPASE", "AMYLASE" in the last 8760 hours. No results for input(s): "AMMONIA" in the last 8760 hours. CBC: Recent Labs    09/14/20 1122 11/07/20 1508 02/14/21 2315 05/01/21 0808  WBC 8.3 9.8 8.1 6.4  NEUTROABS 4,739 6,037  --  3,610  HGB 11.6* 12.7 14.6 12.3  HCT 36.5 39.6 44.0 38.2  MCV 82.2 87.8 85.9 86.0  PLT 238 211 194 237   Lipid Panel: Recent Labs    05/01/21 0808  CHOL 136  HDL 71  LDLCALC 48  TRIG 89  CHOLHDL 1.9   TSH: No results for input(s): "TSH" in the last 8760 hours. A1C: Lab Results  Component Value Date   HGBA1C 7.1 (H) 05/01/2021     Assessment/Plan 1. Depression, major, in remission ([place]) - no mood changes - related to past loss of grandson - not on medication  2. Essential hypertension - controlled - BUN/creat18/0.84 08/29/2021 - cont low sodium diet - amLODipine (NORVASC) 5 MG tablet; TAKE 1 TABLET(5 MG) BY MOUTH DAILY  Dispense: 90 tablet; Refill: 0 - furosemide (LASIX) 20 MG tablet; Take 1 tablet (20 mg total) by mouth daily.  Dispense: 90 tablet; Refill: 0 - lisinopril (ZESTRIL) 10 MG tablet; Take 1 tablet (10 mg total) by mouth daily.  Dispense: 90 tablet; Refill: 0 -  metoprolol tartrate (LOPRESSOR) 25 MG tablet; TAKE 1/2 TABLET(12.5 MG) BY MOUTH TWICE DAILY  Dispense: 45 tablet; Refill: 0 - CBC with Differential/Platelet - CMP  3. Moderate persistent asthmatic bronchitis without complication - no breathing issues - cont Symbicort - budesonide-formoterol (SYMBICORT) 160-4.5 MCG/ACT inhaler; Inhale 2 puffs into the lungs 2 (two) times daily.   Dispense: 3 each; Refill: 0  4. Type 2 diabetes mellitus with stage 2 chronic kidney disease, with long-term current use of insulin (HCC) - A1c 6.4 (07/06)> was 7.1 (03/08) - no hypoglycemic events - cont SSI/Lantus/ACE - foot exam- future - insulin glargine (LANTUS SOLOSTAR) 100 UNIT/ML Solostar Pen; INJECT  38 UNITS UNDER THE SKIN AT BEDTIME  Dispense: 15 mL; Refill: 5 - Hemoglobin A1c  5. Hyperlipidemia associated with type II diabetes - LDL 48 05/01/2021, goal  - cont statin - pravastatin (PRAVACHOL) 20 MG tablet; TAKE 1 TABLET(20 MG) BY MOUTH DAILY  Dispense: 90 tablet; Refill: 0  6. Gout, unspecified cause, unspecified chronicity, unspecified site - no recent flares  7. Gastroesophageal reflux disease, unspecified whether esophagitis present - hgb stable - cont Prevacid and Carafate - lansoprazole (PREVACID) 30 MG capsule; TAKE ONE CAPSULE BY MOUTH DAILY  Dispense: 90 capsule; Refill: 1  8. Lumbar radiculopathy - ongoing - followed by ortho - MRI spine revealed subacute compression fracture L5, degenerative changes with marked stenosis L4-L5, foraminal stenosis L5-S1 - cont PT - cont Norco - will try topical voltaren gel and lidocaine patches  - lidocaine 4 %; Place 1 patch onto the skin every 12 (twelve) hours.  Dispense: 30 patch; Refill: 0  9. Lower extremity edema - non pitting - cont furosemide  10. BMI 33.0-33.9,adult - BMI 33.02 - recommend limiting calories < 1500/day  11. Hyponatremia - Na+ 131 08/29/2021 - asymptomatic - bmp in 2 weeks  Future labs/tests- MMSE, foot exam, discuss vaccinations  Total time: 38 minutes. Greater than 50% of total time spent doing patient education regarding back pain, diabetes, symptom/medication management.    Next appt: none Shareeka Yim [place], [facility] [phone]

## 2021-08-29 NOTE — Patient Instructions (Addendum)
Voltaren gel- topical gel you can apply to back 3-4x/daily  Apply lidocaine patch to where pain is the worst  Ask Dr. Ninfa Linden when safe to resume PT

## 2021-08-30 ENCOUNTER — Ambulatory Visit: Payer: Medicaid Other | Admitting: Physical Therapy

## 2021-08-30 ENCOUNTER — Ambulatory Visit: Payer: Medicaid Other | Admitting: Nurse Practitioner

## 2021-08-30 ENCOUNTER — Other Ambulatory Visit: Payer: Self-pay | Admitting: Orthopedic Surgery

## 2021-08-30 DIAGNOSIS — E871 Hypo-osmolality and hyponatremia: Secondary | ICD-10-CM

## 2021-08-30 LAB — HEMOGLOBIN A1C
Hgb A1c MFr Bld: 6.4 % of total Hgb — ABNORMAL HIGH (ref <5.7)
Mean Plasma Glucose: 137 mg/dL
eAG (mmol/L): 7.6 mmol/L

## 2021-08-30 LAB — CBC WITH DIFFERENTIAL/PLATELET
Absolute Monocytes: 768 cells/uL (ref 200–950)
Basophils Absolute: 48 cells/uL (ref 0–200)
Basophils Relative: 0.6 %
Eosinophils Absolute: 168 cells/uL (ref 15–500)
Eosinophils Relative: 2.1 %
HCT: 39.3 % (ref 35.0–45.0)
Hemoglobin: 12.8 g/dL (ref 11.7–15.5)
Lymphs Abs: 2592 cells/uL (ref 850–3900)
MCH: 27.4 pg (ref 27.0–33.0)
MCHC: 32.6 g/dL (ref 32.0–36.0)
MCV: 84 fL (ref 80.0–100.0)
MPV: 10 fL (ref 7.5–12.5)
Monocytes Relative: 9.6 %
Neutro Abs: 4424 cells/uL (ref 1500–7800)
Neutrophils Relative %: 55.3 %
Platelets: 196 10*3/uL (ref 140–400)
RBC: 4.68 10*6/uL (ref 3.80–5.10)
RDW: 14.7 % (ref 11.0–15.0)
Total Lymphocyte: 32.4 %
WBC: 8 10*3/uL (ref 3.8–10.8)

## 2021-08-30 LAB — COMPREHENSIVE METABOLIC PANEL
AG Ratio: 1.4 (calc) (ref 1.0–2.5)
ALT: 13 U/L (ref 6–29)
AST: 18 U/L (ref 10–35)
Albumin: 4 g/dL (ref 3.6–5.1)
Alkaline phosphatase (APISO): 83 U/L (ref 37–153)
BUN: 18 mg/dL (ref 7–25)
CO2: 23 mmol/L (ref 20–32)
Calcium: 9.4 mg/dL (ref 8.6–10.4)
Chloride: 97 mmol/L — ABNORMAL LOW (ref 98–110)
Creat: 0.84 mg/dL (ref 0.60–0.95)
Globulin: 2.9 g/dL (calc) (ref 1.9–3.7)
Glucose, Bld: 71 mg/dL (ref 65–139)
Potassium: 5 mmol/L (ref 3.5–5.3)
Sodium: 131 mmol/L — ABNORMAL LOW (ref 135–146)
Total Bilirubin: 0.3 mg/dL (ref 0.2–1.2)
Total Protein: 6.9 g/dL (ref 6.1–8.1)

## 2021-09-02 ENCOUNTER — Other Ambulatory Visit: Payer: Self-pay | Admitting: Physician Assistant

## 2021-09-02 DIAGNOSIS — M5416 Radiculopathy, lumbar region: Secondary | ICD-10-CM

## 2021-09-03 ENCOUNTER — Encounter: Payer: Medicaid Other | Admitting: Physical Therapy

## 2021-09-04 ENCOUNTER — Other Ambulatory Visit: Payer: Self-pay | Admitting: Orthopaedic Surgery

## 2021-09-04 ENCOUNTER — Telehealth: Payer: Self-pay

## 2021-09-04 MED ORDER — HYDROCODONE-ACETAMINOPHEN 5-325 MG PO TABS: 1.0000 | ORAL_TABLET | Freq: Three times a day (TID) | ORAL | 0 refills | Status: DC | PRN

## 2021-09-04 NOTE — Telephone Encounter (Signed)
Patient would like a Rx refill sent to her pharmacy? CB# [phone].  Please advise.  Thank you.

## 2021-09-04 NOTE — Telephone Encounter (Signed)
Please advise 

## 2021-09-05 ENCOUNTER — Ambulatory Visit: Payer: Medicaid Other

## 2021-09-05 ENCOUNTER — Other Ambulatory Visit: Payer: Self-pay | Admitting: Physician Assistant

## 2021-09-05 DIAGNOSIS — M5416 Radiculopathy, lumbar region: Secondary | ICD-10-CM

## 2021-09-06 ENCOUNTER — Other Ambulatory Visit: Payer: Self-pay | Admitting: Physician Assistant

## 2021-09-06 ENCOUNTER — Ambulatory Visit
Admission: RE | Admit: 2021-09-06 | Discharge: 2021-09-06 | Disposition: A | Discharge status: Home / Self Care | Payer: Medicaid Other | Source: Ambulatory Visit | Attending: Physician Assistant | Admitting: Physician Assistant

## 2021-09-06 DIAGNOSIS — M5416 Radiculopathy, lumbar region: Secondary | ICD-10-CM

## 2021-09-06 MED ORDER — IOPAMIDOL (ISOVUE-M 200) INJECTION 41%: 10.0000 mL | Freq: Once | INTRAMUSCULAR | Status: DC

## 2021-09-06 MED ORDER — METHYLPREDNISOLONE ACETATE 40 MG/ML INJ SUSP (RADIOLOG
80.0000 mg | Freq: Once | INTRAMUSCULAR | Status: AC
  Administered 2021-09-06: 80 mg via INTRA_ARTICULAR

## 2021-09-06 NOTE — Discharge Instructions (Signed)

## 2021-09-06 NOTE — Discharge Instructions (Signed)

## 2021-09-10 ENCOUNTER — Encounter: Payer: Medicaid Other | Admitting: Physical Therapy

## 2021-09-17 ENCOUNTER — Telehealth: Payer: Self-pay | Admitting: Physician Assistant

## 2021-09-17 DIAGNOSIS — W19XXXA Unspecified fall, initial encounter: Secondary | ICD-10-CM | POA: Diagnosis not present

## 2021-09-17 DIAGNOSIS — R062 Wheezing: Secondary | ICD-10-CM | POA: Diagnosis not present

## 2021-09-17 DIAGNOSIS — R079 Chest pain, unspecified: Secondary | ICD-10-CM | POA: Diagnosis not present

## 2021-09-17 DIAGNOSIS — N390 Urinary tract infection, site not specified: Secondary | ICD-10-CM | POA: Diagnosis not present

## 2021-09-17 DIAGNOSIS — M19011 Primary osteoarthritis, right shoulder: Secondary | ICD-10-CM | POA: Diagnosis not present

## 2021-09-17 DIAGNOSIS — M4317 Spondylolisthesis, lumbosacral region: Secondary | ICD-10-CM | POA: Diagnosis not present

## 2021-09-17 DIAGNOSIS — M48061 Spinal stenosis, lumbar region without neurogenic claudication: Secondary | ICD-10-CM | POA: Diagnosis not present

## 2021-09-17 DIAGNOSIS — M4316 Spondylolisthesis, lumbar region: Secondary | ICD-10-CM | POA: Diagnosis not present

## 2021-09-17 DIAGNOSIS — N1330 Unspecified hydronephrosis: Secondary | ICD-10-CM | POA: Diagnosis not present

## 2021-09-17 DIAGNOSIS — S3219XA Other fracture of sacrum, initial encounter for closed fracture: Secondary | ICD-10-CM | POA: Diagnosis not present

## 2021-09-17 DIAGNOSIS — M4856XA Collapsed vertebra, not elsewhere classified, lumbar region, initial encounter for fracture: Secondary | ICD-10-CM | POA: Diagnosis not present

## 2021-09-17 DIAGNOSIS — S42111A Displaced fracture of body of scapula, right shoulder, initial encounter for closed fracture: Secondary | ICD-10-CM | POA: Diagnosis not present

## 2021-09-17 DIAGNOSIS — S42191A Fracture of other part of scapula, right shoulder, initial encounter for closed fracture: Secondary | ICD-10-CM | POA: Diagnosis not present

## 2021-09-17 DIAGNOSIS — M4854XA Collapsed vertebra, not elsewhere classified, thoracic region, initial encounter for fracture: Secondary | ICD-10-CM | POA: Diagnosis not present

## 2021-09-17 NOTE — Telephone Encounter (Signed)
Patient receive a call from Physical Medicine regarding an injection from a referral Benita Stabile placed to them. Patient wants to know if she is to get another injection.    Please call Kathlee Nations [phone]

## 2021-09-17 NOTE — Telephone Encounter (Signed)
09/16/2021  4:42 PM Elezovic, Doristine Church B Already received injection from GI , will contact Lucianne Muss , PA office to find out if she needs any injection in our office. Will call back tomorrow .

## 2021-09-25 ENCOUNTER — Encounter: Payer: Medicaid Other | Admitting: Skilled Nursing Facility1

## 2021-10-01 ENCOUNTER — Encounter (HOSPITAL_COMMUNITY): Payer: Self-pay

## 2021-10-01 ENCOUNTER — Other Ambulatory Visit: Payer: Self-pay

## 2021-10-01 ENCOUNTER — Telehealth: Payer: Self-pay

## 2021-10-01 ENCOUNTER — Emergency Department (HOSPITAL_COMMUNITY)
Admission: EM | Admit: 2021-10-01 | Discharge: 2021-10-01 | Disposition: A | Discharge status: Home / Self Care | Payer: Medicaid Other | Attending: Emergency Medicine | Admitting: Emergency Medicine

## 2021-10-01 DIAGNOSIS — R0789 Other chest pain: Secondary | ICD-10-CM | POA: Diagnosis not present

## 2021-10-01 DIAGNOSIS — M5416 Radiculopathy, lumbar region: Secondary | ICD-10-CM

## 2021-10-01 DIAGNOSIS — E1122 Type 2 diabetes mellitus with diabetic chronic kidney disease: Secondary | ICD-10-CM | POA: Diagnosis not present

## 2021-10-01 DIAGNOSIS — I130 Hypertensive heart and chronic kidney disease with heart failure and stage 1 through stage 4 chronic kidney disease, or unspecified chronic kidney disease: Secondary | ICD-10-CM | POA: Insufficient documentation

## 2021-10-01 DIAGNOSIS — W19XXXA Unspecified fall, initial encounter: Secondary | ICD-10-CM | POA: Diagnosis not present

## 2021-10-01 DIAGNOSIS — M79605 Pain in left leg: Secondary | ICD-10-CM

## 2021-10-01 DIAGNOSIS — S32010D Wedge compression fracture of first lumbar vertebra, subsequent encounter for fracture with routine healing: Secondary | ICD-10-CM | POA: Diagnosis not present

## 2021-10-01 DIAGNOSIS — S3992XD Unspecified injury of lower back, subsequent encounter: Secondary | ICD-10-CM | POA: Diagnosis present

## 2021-10-01 DIAGNOSIS — R6889 Other general symptoms and signs: Secondary | ICD-10-CM | POA: Diagnosis not present

## 2021-10-01 DIAGNOSIS — M5136 Other intervertebral disc degeneration, lumbar region: Secondary | ICD-10-CM

## 2021-10-01 DIAGNOSIS — Z794 Long term (current) use of insulin: Secondary | ICD-10-CM | POA: Diagnosis not present

## 2021-10-01 DIAGNOSIS — W19XXXD Unspecified fall, subsequent encounter: Secondary | ICD-10-CM | POA: Diagnosis not present

## 2021-10-01 DIAGNOSIS — Z79899 Other long term (current) drug therapy: Secondary | ICD-10-CM | POA: Diagnosis not present

## 2021-10-01 DIAGNOSIS — I5032 Chronic diastolic (congestive) heart failure: Secondary | ICD-10-CM | POA: Diagnosis not present

## 2021-10-01 DIAGNOSIS — N183 Chronic kidney disease, stage 3 unspecified: Secondary | ICD-10-CM | POA: Diagnosis not present

## 2021-10-01 DIAGNOSIS — S32000D Wedge compression fracture of unspecified lumbar vertebra, subsequent encounter for fracture with routine healing: Secondary | ICD-10-CM

## 2021-10-01 DIAGNOSIS — M25552 Pain in left hip: Secondary | ICD-10-CM | POA: Diagnosis not present

## 2021-10-01 MED ORDER — NAPROXEN 500 MG PO TABS: 500.0000 mg | ORAL_TABLET | Freq: Two times a day (BID) | ORAL | 0 refills | Status: AC

## 2021-10-01 MED ORDER — HYDROCODONE-ACETAMINOPHEN 5-325 MG PO TABS
1.0000 | ORAL_TABLET | Freq: Once | ORAL | Status: AC
  Administered 2021-10-01: 1 via ORAL
  Filled 2021-10-01: qty 1

## 2021-10-01 NOTE — ED Provider Notes (Signed)
[facility] DEPT Provider Note   CSN: [id number] Arrival date & time: 10/01/21  1321     History  Chief Complaint  Patient presents with   Left Hip Pain    Joyce Gilmore is a 87 y.o. female.  Patient presents to the hospital complaining of left-sided hip pain.  Patient had a fall approximately 3 months ago.  She injured her chest and her L1 vertebrae.  Patient has been followed by orthopedics since the time of the injury and has had therapy with injections.  The patient presents today complaining of continued pain.  She has no new falls or injuries.  She has not hit her head and denies any head trauma.  She denies shortness of breath, chest pain, abdominal pain.  Patient complains of continued pain in the left hip, lumbar spine, and chest (from previous trauma, not cardiac).  She denies radicular symptoms.  Past medical history significant for type II DM with CKD, hypertension, osteoporosis, SIADH, degenerative disc disease lumbar, spinal compression fracture, chronic diastolic heart failure, asthmatic bronchitis, stage III CKD  HPI     Home Medications Prior to Admission medications   Medication Sig Start Date End Date Taking? Authorizing Provider  naproxen (NAPROSYN) 500 MG tablet Take 1 tablet (500 mg total) by mouth 2 (two) times daily. 10/01/21  Yes Cherlynn June B, PA-C  amLODipine (NORVASC) 5 MG tablet TAKE 1 TABLET(5 MG) BY MOUTH DAILY 08/29/21   Fargo, Amy E, NP  budesonide-formoterol (SYMBICORT) 160-4.5 MCG/ACT inhaler Inhale 2 puffs into the lungs 2 (two) times daily. 08/29/21   Fargo, Amy E, NP  furosemide (LASIX) 20 MG tablet Take 1 tablet (20 mg total) by mouth daily. 08/29/21   Fargo, Amy E, NP  glucose blood test strip Please dispense based on patient and insurance preference.  Use as directed to monitor FSBS four times daily.  Dx E11.22 01/28/21   Eulogio Bear, NP  HYDROcodone-acetaminophen (NORCO) 5-325 MG tablet Take 1 tablet by  mouth 3 (three) times daily as needed for moderate pain. 09/04/21   Mcarthur Rossetti, MD  insulin aspart (NOVOLOG FLEXPEN) 100 UNIT/ML FlexPen ADMINISTER 5 TO 15 UNITS UNDER THE SKIN THREE TIMES DAILY WITH MEALS PER SLIDING SCALE 04/25/21   Lauree Chandler, NP  insulin glargine (LANTUS SOLOSTAR) 100 UNIT/ML Solostar Pen INJECT  38 UNITS UNDER THE SKIN AT BEDTIME 08/29/21   Yvonna Alanis, NP  Lancets MISC Please dispense based on patient and insurance preference.  Use as directed to monitor FSBS four times daily.  Dx E11.22 01/28/21   Eulogio Bear, NP  lansoprazole (PREVACID) 30 MG capsule TAKE ONE CAPSULE BY MOUTH DAILY 08/29/21   Fargo, Amy E, NP  lidocaine 4 % Place 1 patch onto the skin every 12 (twelve) hours. 08/29/21   Fargo, Amy E, NP  lisinopril (ZESTRIL) 10 MG tablet Take 1 tablet (10 mg total) by mouth daily. 08/29/21   Fargo, Amy E, NP  metoprolol tartrate (LOPRESSOR) 25 MG tablet TAKE 1/2 TABLET(12.5 MG) BY MOUTH TWICE DAILY 08/29/21   Fargo, Amy E, NP  pravastatin (PRAVACHOL) 20 MG tablet TAKE 1 TABLET(20 MG) BY MOUTH DAILY 08/29/21   Fargo, Amy E, NP  sucralfate (CARAFATE) 1 g tablet TAKE 1 TABLET(1 GRAM) BY MOUTH FOUR TIMES DAILY 11/07/20   Eulogio Bear, NP      Allergies    Bactrim [sulfamethoxazole-trimethoprim], Rifampin, and Vancomycin    Review of Systems   Review of Systems  Musculoskeletal:  Positive for arthralgias and back pain.    Physical Exam Updated Vital Signs BP (!) 132/56   Pulse (!) 56   Temp 98.8 F (37.1 C) (Oral)   Resp 18   SpO2 98%  Physical Exam Vitals and nursing note reviewed.  Constitutional:      General: She is not in acute distress. HENT:     Head: Normocephalic and atraumatic.     Mouth/Throat:     Mouth: Mucous membranes are moist.  Eyes:     Extraocular Movements: Extraocular movements intact.     Conjunctiva/sclera: Conjunctivae normal.     Pupils: Pupils are equal, round, and reactive to light.  Cardiovascular:     Rate  and Rhythm: Normal rate and regular rhythm.     Pulses: Normal pulses.  Pulmonary:     Effort: Pulmonary effort is normal.     Breath sounds: Normal breath sounds.  Abdominal:     Palpations: Abdomen is soft.     Tenderness: There is no abdominal tenderness.  Musculoskeletal:     Cervical back: Normal range of motion and neck supple.     Comments: Patient reluctant to allow manipulation of left hip due to pain, no deformity, grossly equal lower extremity strength with quadriceps and EHL  Skin:    General: Skin is warm and dry.  Neurological:     Mental Status: She is alert and oriented to person, place, and time.     ED Results / Procedures / Treatments   Labs (all labs ordered are listed, but only abnormal results are displayed) Labs Reviewed - No data to display  EKG None  Radiology No results found.  Procedures Procedures    Medications Ordered in ED Medications  HYDROcodone-acetaminophen (NORCO/VICODIN) 5-325 MG per tablet 1 tablet (has no administration in time range)    ED Course/ Medical Decision Making/ A&P                           Medical Decision Making  The patient presents with a worsening of her chronic hip pain due to a L1 compression fracture.  She has no new complaints today.  She states that she feels that the injections provided by orthopedics are not providing relief.  I discussed the patient's care with the patient and her son.  I explained that the specialist she is seeing is the best course for further care for her back injury.  I explained that there was no fix available in the emergency department for her pain due to the fracture.  I ordered hydrocodone for the patient which did improve the pain somewhat.  I considered prescribing patient a short course of steroids but the patient is a type II diabetic who uses insulin and I was concerned about a spike in her glucose.  I saw no utility for repeat imaging today since the patient has had imaging  since her fall 3 months ago including plain films of the hip, lumbar spine, MRI lumbar spine, and imaging of facet joints for injections.  There was no new fall or injury to suggest repeat imaging today.  She has no red flag symptoms such as incontinence, urinary retention, saddle anesthesia  Plan to discharge patient home with a short course of anti-inflammatories.  She should follow-up with both primary care and orthopedics for further care and management.  She may need chronic pain management if the injections do not provide further relief  Final Clinical Impression(s) / ED Diagnoses Final diagnoses:  Left hip pain  Compression fracture of lumbar vertebra with routine healing, unspecified lumbar vertebral level, subsequent encounter    Rx / DC Orders ED Discharge Orders          Ordered    naproxen (NAPROSYN) 500 MG tablet  2 times daily        10/01/21 1434              Ronny Bacon 10/01/21 1436    Long, Wonda Olds, MD 10/02/21 [phone]

## 2021-10-01 NOTE — ED Notes (Signed)
Patient given discharge instructions, all questions answered. Patient in possession of all belongings, directed to the discharge area  

## 2021-10-01 NOTE — Telephone Encounter (Signed)
Sent referral to neurosurgery; patients granddaughter aware

## 2021-10-01 NOTE — Telephone Encounter (Signed)
Granddaughter called stating patient is just in so much pain constantly with her back What can they do next?

## 2021-10-01 NOTE — ED Triage Notes (Addendum)
Patient fell 3 months ago and has had ongoing hip pain since then. Evaluated/followed by orthopedics. MRI spine revealed subacute compression fracture L5, degenerative changes with marked stenosis L4-L5, foraminal stenosis L5-S1.

## 2021-10-01 NOTE — Discharge Instructions (Addendum)
You were seen today for a worsening of your chronic pain due to your lumbar compression fracture.  I have prescribed a short course of anti-inflammatories.  Please follow-up with primary care and orthopedics for further evaluation and management.

## 2021-10-15 LAB — HM DIABETES EYE EXAM

## 2021-11-11 ENCOUNTER — Other Ambulatory Visit: Payer: Self-pay | Admitting: Nurse Practitioner

## 2021-11-11 ENCOUNTER — Other Ambulatory Visit: Payer: Self-pay | Admitting: Orthopedic Surgery

## 2021-11-11 ENCOUNTER — Telehealth: Payer: Self-pay

## 2021-11-11 DIAGNOSIS — Z1231 Encounter for screening mammogram for malignant neoplasm of breast: Secondary | ICD-10-CM

## 2021-11-11 NOTE — Telephone Encounter (Signed)
Patient's granddaughter, Kathlee Nations, called stating that she was trying to scheduled mammogram and she answered the question that yes one breast was a little bigger than the other. She was told that provider would need to order a diagnostic mammogram. She was trying to schedule at the [facility] on [address] routed to Windell Moulding, NP

## 2021-11-18 ENCOUNTER — Other Ambulatory Visit: Payer: Self-pay | Admitting: Nurse Practitioner

## 2021-11-18 DIAGNOSIS — E1122 Type 2 diabetes mellitus with diabetic chronic kidney disease: Secondary | ICD-10-CM

## 2021-11-19 NOTE — Telephone Encounter (Signed)
Requested Prescriptions  Pending Prescriptions Disp Refills  . BD PEN NEEDLE NANO 2ND GEN 32G X 4 MM MISC [Pharmacy Med Name: B-D NANO 2ND GEN PEN NDL [id number]] 100 each     Sig: USE TO INJECT INSULIN 4 TIMES DAILY AS DIRECTED     Endocrinology: Diabetes - Testing Supplies Passed - 11/18/2021  9:55 PM      Passed - Valid encounter within last 12 months    Recent Outpatient Visits          7 months ago Mild intermittent asthma with exacerbation   [place] Noemi Chapel A, NP   9 months ago Type 2 DM with CKD stage 3 and hypertension ([place])   [facility] Noemi Chapel A, NP   9 months ago Type 2 diabetes mellitus with stage 2 chronic kidney disease, with long-term current use of insulin ([place])   [facility] Eulogio Bear, NP   1 year ago Type 2 diabetes mellitus with stage 2 chronic kidney disease, with long-term current use of insulin ([place])   [place] Eulogio Bear, NP   1 year ago Type 2 diabetes mellitus with stage 2 chronic kidney disease, with long-term current use of insulin ([place])   [place] Eulogio Bear, NP      Future Appointments            In 1 month Yvonna Alanis, NP [facility]

## 2021-11-20 ENCOUNTER — Other Ambulatory Visit: Payer: Self-pay | Admitting: Orthopedic Surgery

## 2021-11-20 ENCOUNTER — Telehealth: Payer: Self-pay

## 2021-11-20 DIAGNOSIS — M5416 Radiculopathy, lumbar region: Secondary | ICD-10-CM

## 2021-11-20 DIAGNOSIS — Z1231 Encounter for screening mammogram for malignant neoplasm of breast: Secondary | ICD-10-CM

## 2021-11-20 NOTE — Telephone Encounter (Signed)
Patient would like a referral to a pain clinic.  Stated that injection that she received from Dr. Kathyrn Sheriff about 2-3 weeks ago, did not help.  CB# [phone].  Please advise.  Thank you.

## 2021-11-20 NOTE — Telephone Encounter (Signed)
Referral sent to [facility]. They will contact pt to schedule appt

## 2021-11-20 NOTE — Addendum Note (Signed)
Addended by: Elvin So L on: 11/20/2021 11:11 AM   Modules accepted: Orders

## 2021-11-20 NOTE — Telephone Encounter (Signed)
Can we try to get her in somewhere soon. This is Joyce Gilmore's grandma

## 2021-11-20 NOTE — Telephone Encounter (Signed)
Ok for referral?

## 2021-11-21 ENCOUNTER — Other Ambulatory Visit: Payer: Self-pay

## 2021-11-21 DIAGNOSIS — E1122 Type 2 diabetes mellitus with diabetic chronic kidney disease: Secondary | ICD-10-CM

## 2021-11-21 MED ORDER — PEN NEEDLES 32G X 4 MM MISC: 1.0000 | Freq: Four times a day (QID) | 11 refills | Status: AC

## 2021-11-29 ENCOUNTER — Encounter: Payer: Self-pay | Admitting: Physical Medicine & Rehabilitation

## 2021-12-03 ENCOUNTER — Other Ambulatory Visit: Payer: Self-pay | Admitting: Neurosurgery

## 2021-12-04 ENCOUNTER — Other Ambulatory Visit: Payer: Self-pay | Admitting: Neurosurgery

## 2021-12-05 ENCOUNTER — Other Ambulatory Visit: Payer: Self-pay

## 2021-12-05 ENCOUNTER — Ambulatory Visit: Payer: Medicaid Other | Admitting: Orthopedic Surgery

## 2021-12-05 ENCOUNTER — Encounter: Payer: Self-pay | Admitting: Orthopedic Surgery

## 2021-12-05 VITALS — BP 142/74 | HR 65 | Temp 96.9°F | Ht <= 58 in | Wt 157.6 lb

## 2021-12-05 DIAGNOSIS — Z23 Encounter for immunization: Secondary | ICD-10-CM

## 2021-12-05 DIAGNOSIS — E1122 Type 2 diabetes mellitus with diabetic chronic kidney disease: Secondary | ICD-10-CM | POA: Diagnosis not present

## 2021-12-05 DIAGNOSIS — J4540 Moderate persistent asthma, uncomplicated: Secondary | ICD-10-CM

## 2021-12-05 DIAGNOSIS — E1169 Type 2 diabetes mellitus with other specified complication: Secondary | ICD-10-CM

## 2021-12-05 DIAGNOSIS — M5416 Radiculopathy, lumbar region: Secondary | ICD-10-CM | POA: Diagnosis not present

## 2021-12-05 DIAGNOSIS — I10 Essential (primary) hypertension: Secondary | ICD-10-CM

## 2021-12-05 DIAGNOSIS — E785 Hyperlipidemia, unspecified: Secondary | ICD-10-CM

## 2021-12-05 DIAGNOSIS — E871 Hypo-osmolality and hyponatremia: Secondary | ICD-10-CM

## 2021-12-05 DIAGNOSIS — N182 Chronic kidney disease, stage 2 (mild): Secondary | ICD-10-CM

## 2021-12-05 DIAGNOSIS — K219 Gastro-esophageal reflux disease without esophagitis: Secondary | ICD-10-CM

## 2021-12-05 DIAGNOSIS — Z794 Long term (current) use of insulin: Secondary | ICD-10-CM

## 2021-12-05 MED ORDER — METOPROLOL TARTRATE 25 MG PO TABS: ORAL_TABLET | ORAL | 3 refills | Status: AC

## 2021-12-05 MED ORDER — BUDESONIDE-FORMOTEROL FUMARATE 160-4.5 MCG/ACT IN AERO: 2.0000 | INHALATION_SPRAY | Freq: Two times a day (BID) | RESPIRATORY_TRACT | 6 refills | Status: AC

## 2021-12-05 MED ORDER — PRAVASTATIN SODIUM 20 MG PO TABS: ORAL_TABLET | ORAL | 3 refills | Status: AC

## 2021-12-05 MED ORDER — LANTUS SOLOSTAR 100 UNIT/ML ~~LOC~~ SOPN: PEN_INJECTOR | SUBCUTANEOUS | 5 refills | Status: AC

## 2021-12-05 MED ORDER — LISINOPRIL 10 MG PO TABS: 10.0000 mg | ORAL_TABLET | Freq: Every day | ORAL | 3 refills | Status: AC

## 2021-12-05 MED ORDER — FUROSEMIDE 20 MG PO TABS: 20.0000 mg | ORAL_TABLET | Freq: Every day | ORAL | 3 refills | Status: AC

## 2021-12-05 MED ORDER — LANSOPRAZOLE 30 MG PO CPDR: DELAYED_RELEASE_CAPSULE | ORAL | 3 refills | Status: AC

## 2021-12-05 MED ORDER — AMLODIPINE BESYLATE 5 MG PO TABS: ORAL_TABLET | ORAL | 3 refills | Status: AC

## 2021-12-05 MED ORDER — NOVOLOG FLEXPEN 100 UNIT/ML ~~LOC~~ SOPN: PEN_INJECTOR | SUBCUTANEOUS | 5 refills | Status: AC

## 2021-12-05 NOTE — Patient Instructions (Addendum)
Ask surgeon office about surgical clearance forms - Dr. Carroll Kinds [phone]

## 2021-12-05 NOTE — Progress Notes (Addendum)
  Careteam: Patient Care Team: Fargo, Amy E, NP as PCP - General (Adult Health Nurse Practitioner) Blackman, Christopher Y, MD as Consulting Physician (Orthopedic Surgery)  Seen by: Amy Fargo, AGNP-C  PLACE OF SERVICE:  [facility]  Advanced Directive information    Allergies  Allergen Reactions   Bactrim [Sulfamethoxazole-Trimethoprim] Other (See Comments)    Hyperkalemia (elevated potassium)   Rifampin Other (See Comments)    Severe thrombocytopenia (low blood platelet count)   Vancomycin Other (See Comments)    Severe thrombocytopenia (low blood platelet count)    Chief Complaint  Patient presents with   Medical Management of Chronic Issues    Paitent presents today for a 3 month follow-up   Quality Metric Gaps    Microalbumin, foot exam, TDAP, zoster, COVID#4     HPI: Patient is a 87 y.o. female  seen today for medical management of chronic conditions.   Interpreter present during encounter.   Back pain ongoing. MRI spine revealed subacute compression fracture L5, degenerative changes with marked stenosis L4-L5, foraminal stenosis L5-S1. She has been evaluated by many specialists.Scheduled to have laminectomy by Dr. Numdkumar 10/30. Still taking hydrocodone for pain but does not help.   T2DM- checking sugars a few times daily, blood sugars between 100-200's, no hypoglycemic events, remains on Lantus/SSI/lisinopril and statin.   Appetite fair. Weight down 12 lbs in 6 months.   No recent falls. Goes to bathroom with walker, uses wheelchair for long distances.   Review of Systems:  Review of Systems  Constitutional:  Positive for weight loss. Negative for chills, fever and malaise/fatigue.  HENT:  Negative for hearing loss and sore throat.   Eyes:  Negative for blurred vision and double vision.  Respiratory:  Negative for cough, shortness of breath and wheezing.   Cardiovascular:  Negative for chest pain and leg swelling.  Gastrointestinal:  Negative for  abdominal pain, blood in stool, constipation, diarrhea, heartburn, nausea and vomiting.  Genitourinary:  Negative for dysuria and hematuria.  Musculoskeletal:  Positive for back pain. Negative for falls and joint pain.  Skin:  Negative for rash.  Neurological:  Positive for weakness. Negative for dizziness and headaches.  Psychiatric/Behavioral:  Positive for depression. The patient is not nervous/anxious and does not have insomnia.     Past Medical History:  Diagnosis Date   Arthritis    Asthma    Per new patient form   CHF (congestive heart failure) (HCC)    diastolic   Chronic kidney disease    hx of kidney stones,    Depression    hx of    Diabetes mellitus without complication (HCC)    Ganglion, left ankle and foot    Gout    Per new patient form   Hearing loss    Per new patient form   Hearing loss of both ears    Hypertension    MRSA infection    Oct 13 - Nov 13   Osteoporosis    Tinnitus of both ears    Past Surgical History:  Procedure Laterality Date   INCISION AND DRAINAGE HIP  12/22/2011   Procedure: IRRIGATION AND DEBRIDEMENT HIP;  Surgeon: Christopher Y Blackman, MD;  Location: [facility];  Service: Orthopedics;  Laterality: Right;  Irrigation and debridement right hip   INCISION AND DRAINAGE HIP  12/27/2011   Procedure: IRRIGATION AND DEBRIDEMENT HIP;  Surgeon: Christopher Y Blackman, MD;  Location: [facility];  Service: Orthopedics;  Laterality: Right;  Repeat irrigation and debridement     JOINT REPLACEMENT     bilateral knee, right hip    MASS EXCISION N/A 01/03/2020   Procedure: EXCISION OF HEMANGIOMA OF THE PALATE;  Surgeon: Leta Baptist, MD;  Location: [facility];  Service: ENT;  Laterality: N/A;   TOTAL HIP REVISION  12/05/2011   Procedure: TOTAL HIP REVISION;  Surgeon: Mcarthur Rossetti, MD;  Location: [facility];  Service: Orthopedics;  Laterality: Right;  Right Hip Revision Arthroplasty to Total Hip, Excision of Old Implant   Social History:    reports that she has never smoked. She has never used smokeless tobacco. She reports that she does not drink alcohol and does not use drugs.  Family History  Problem Relation Age of Onset   Hypertension Mother    Colon cancer Neg Hx    Breast cancer Neg Hx     Medications: Patient's Medications  New Prescriptions   No medications on file  Previous Medications   AMLODIPINE (NORVASC) 5 MG TABLET    TAKE 1 TABLET(5 MG) BY MOUTH DAILY   BUDESONIDE-FORMOTEROL (SYMBICORT) 160-4.5 MCG/ACT INHALER    Inhale 2 puffs into the lungs 2 (two) times daily.   FUROSEMIDE (LASIX) 20 MG TABLET    Take 1 tablet (20 mg total) by mouth daily.   GLUCOSE BLOOD TEST STRIP    Please dispense based on patient and insurance preference.  Use as directed to monitor FSBS four times daily.  Dx E11.22   HYDROCODONE-ACETAMINOPHEN (NORCO) 5-325 MG TABLET    Take 1 tablet by mouth 3 (three) times daily as needed for moderate pain.   INSULIN ASPART (NOVOLOG FLEXPEN) 100 UNIT/ML FLEXPEN    ADMINISTER 5 TO 15 UNITS UNDER THE SKIN THREE TIMES DAILY WITH MEALS PER SLIDING SCALE   INSULIN GLARGINE (LANTUS SOLOSTAR) 100 UNIT/ML SOLOSTAR PEN    INJECT  38 UNITS UNDER THE SKIN AT BEDTIME   INSULIN PEN NEEDLE (PEN NEEDLES) 32G X 4 MM MISC    1 Device by Does not apply route 4 (four) times daily. E11.22   LANCETS MISC    Please dispense based on patient and insurance preference.  Use as directed to monitor FSBS four times daily.  Dx E11.22   LANSOPRAZOLE (PREVACID) 30 MG CAPSULE    TAKE ONE CAPSULE BY MOUTH DAILY   LIDOCAINE 4 %    Place 1 patch onto the skin every 12 (twelve) hours.   LISINOPRIL (ZESTRIL) 10 MG TABLET    Take 1 tablet (10 mg total) by mouth daily.   METOPROLOL TARTRATE (LOPRESSOR) 25 MG TABLET    TAKE 1/2 TABLET(12.5 MG) BY MOUTH TWICE DAILY   NAPROXEN (NAPROSYN) 500 MG TABLET    Take 1 tablet (500 mg total) by mouth 2 (two) times daily.   PRAVASTATIN (PRAVACHOL) 20 MG TABLET    TAKE 1 TABLET(20 MG) BY MOUTH DAILY    SUCRALFATE (CARAFATE) 1 G TABLET    TAKE 1 TABLET(1 GRAM) BY MOUTH FOUR TIMES DAILY  Modified Medications   No medications on file  Discontinued Medications   No medications on file    Physical Exam:  Vitals:   12/05/21 1342  BP: (!) 142/74  Pulse: 65  Temp: (!) 96.9 F (36.1 C)  SpO2: 97%  Weight: 157 lb 9.6 oz (71.5 kg)  Height: 4' 10" (1.473 m)   Body mass index is 32.94 kg/m. Wt Readings from Last 3 Encounters:  12/05/21 157 lb 9.6 oz (71.5 kg)  08/29/21 158 lb (71.7 kg)  04/25/21 169 lb (  76.7 kg)    Physical Exam Vitals reviewed.  Constitutional:      General: She is not in acute distress.    Appearance: She is obese.  HENT:     Head: Normocephalic.  Eyes:     General:        Right eye: No discharge.        Left eye: No discharge.  Cardiovascular:     Rate and Rhythm: Normal rate and regular rhythm.     Pulses:          Dorsalis pedis pulses are 1+ on the right side and 1+ on the left side.       Posterior tibial pulses are 1+ on the right side and 1+ on the left side.     Heart sounds: Normal heart sounds.  Pulmonary:     Effort: Pulmonary effort is normal. No respiratory distress.     Breath sounds: Normal breath sounds. No wheezing.  Abdominal:     General: Bowel sounds are normal. There is no distension.     Palpations: Abdomen is soft.     Tenderness: There is no abdominal tenderness.  Musculoskeletal:     Cervical back: Neck supple.     Right lower leg: No edema.     Left lower leg: No edema.     Right foot: Normal range of motion.     Left foot: Normal range of motion.  Feet:     Right foot:     Protective Sensation: 9 sites tested.  10 sites sensed.     Skin integrity: Skin integrity normal.     Toenail Condition: Right toenails are abnormally thick.     Left foot:     Protective Sensation: 9 sites tested.  10 sites sensed.     Skin integrity: Skin integrity normal.     Toenail Condition: Left toenails are abnormally thick.  Skin:     General: Skin is warm and dry.     Capillary Refill: Capillary refill takes less than 2 seconds.  Neurological:     General: No focal deficit present.     Mental Status: She is oriented to person, place, and time.     Motor: Weakness present.     Gait: Gait abnormal.     Comments: Walker/wheelchair  Psychiatric:        Mood and Affect: Mood normal.        Behavior: Behavior normal.     Labs reviewed: Basic Metabolic Panel: Recent Labs    05/01/21 0808 05/08/21 0940 08/29/21 1513  NA 134* 133* 131*  K 5.4* 5.0 5.0  CL 100 99 97*  CO2 _0 GLUCOSE 84 84 71  BUN 18 31* 18  CREATININE 0.95 1.03* 0.84  CALCIUM 9.6 9.4 9.4   Liver Function Tests: Recent Labs    05/01/21 0808 08/29/21 1513  AST 18 18  ALT 13 13  BILITOT 0.5 0.3  PROT 6.6 6.9   No results for input(s): "LIPASE", "AMYLASE" in the last 8760 hours. No results for input(s): "AMMONIA" in the last 8760 hours. CBC: Recent Labs    02/14/21 2315 05/01/21 0808 08/29/21 1513  WBC 8.1 6.4 8.0  NEUTROABS  --  3,610 4,424  HGB 14.6 12.3 12.8  HCT 44.0 38.2 39.3  MCV 85.9 86.0 84.0  PLT 194 237 196   Lipid Panel: Recent Labs    05/01/21 0808  CHOL 136  HDL 71  LDLCALC 48  TRIG 89  CHOLHDL 1.9   TSH: No results for input(s): "TSH" in the last 8760 hours. A1C: Lab Results  Component Value Date   HGBA1C 6.4 (H) 08/29/2021     Assessment/Plan 1. Type 2 diabetes mellitus with stage 2 chronic kidney disease, with long-term current use of insulin (HCC) - A1c 6.4 08/2021 - no recent hypoglycemias, sugars averaging 100-200's - foot exam done today - cont Lantus/SSI/lisinopril/statin - Hemoglobin A1c - urine microalbumin when she is able to ambulate better  2. Hyponatremia - Na+ 124 (10/12)> 131> 133 - h/o CHF with furosemide and SIADH - send to ED for further evaluation  3. Hyperlipidemia associated with type 2 diabetes mellitus (HCC) - LDL 48, goal < 70 - cont statin  4. Lumbar  radiculopathy - ongoing - mechanical fall 6 months ago - MRI spine revealed subacute compression fracture L5, degenerative changes with marked stenosis L4-L5, foraminal stenosis L5-S1 - L-5 laminectomy by Dr. Carroll Kinds 10/30 - cont norco for pain  5. Moderate persistent asthmatic bronchitis without complication - no exacerbations - cont Symbicort  6. Essential hypertension - controlled - cont lisinopril - CBC with Differential/Platelet - CMP with eGFR([facility])  7. Flu vaccine need - Flu Vaccine QUAD High Dose(Fluad)  Total time: 36 minutes. Greater than 50% of total time spent doing patient education regarding T2DM, back pain, HTN, HLD, asthma, health maintenance.     Next appt: 03/13/2022  Windell Moulding, [facility] [phone]

## 2021-12-06 ENCOUNTER — Other Ambulatory Visit: Payer: Self-pay

## 2021-12-06 ENCOUNTER — Other Ambulatory Visit: Payer: Self-pay | Admitting: Orthopedic Surgery

## 2021-12-06 ENCOUNTER — Encounter (HOSPITAL_COMMUNITY): Payer: Self-pay | Admitting: *Deleted

## 2021-12-06 ENCOUNTER — Emergency Department (HOSPITAL_COMMUNITY)
Admission: EM | Admit: 2021-12-06 | Discharge: 2021-12-07 | Disposition: A | Discharge status: Home / Self Care | Payer: Medicaid Other | Attending: Emergency Medicine | Admitting: Emergency Medicine

## 2021-12-06 DIAGNOSIS — E871 Hypo-osmolality and hyponatremia: Secondary | ICD-10-CM | POA: Diagnosis not present

## 2021-12-06 DIAGNOSIS — R799 Abnormal finding of blood chemistry, unspecified: Secondary | ICD-10-CM | POA: Diagnosis present

## 2021-12-06 LAB — COMPREHENSIVE METABOLIC PANEL
ALT: 16 U/L (ref 0–44)
AST: 23 U/L (ref 15–41)
Albumin: 3.8 g/dL (ref 3.5–5.0)
Alkaline Phosphatase: 73 U/L (ref 38–126)
Anion gap: 11 (ref 5–15)
BUN: 17 mg/dL (ref 8–23)
CO2: 20 mmol/L — ABNORMAL LOW (ref 22–32)
Calcium: 9.4 mg/dL (ref 8.9–10.3)
Chloride: 95 mmol/L — ABNORMAL LOW (ref 98–111)
Creatinine, Ser: 0.87 mg/dL (ref 0.44–1.00)
GFR, Estimated: >60 mL/min (ref >60)
Glucose, Bld: 189 mg/dL — ABNORMAL HIGH (ref 70–99)
Potassium: 4.9 mmol/L (ref 3.5–5.1)
Sodium: 126 mmol/L — ABNORMAL LOW (ref 135–145)
Total Bilirubin: 0.3 mg/dL (ref 0.3–1.2)
Total Protein: 6.8 g/dL (ref 6.5–8.1)

## 2021-12-06 LAB — CBC WITH DIFFERENTIAL/PLATELET
Abs Immature Granulocytes: 0.01 10*3/uL (ref 0.00–0.07)
Absolute Monocytes: 676 cells/uL (ref 200–950)
Basophils Absolute: 53 cells/uL (ref 0–200)
Basophils Absolute: 0.1 10*3/uL (ref 0.0–0.1)
Basophils Relative: 0.7 %
Basophils Relative: 1 %
Eosinophils Absolute: 220 cells/uL (ref 15–500)
Eosinophils Absolute: 0.2 10*3/uL (ref 0.0–0.5)
Eosinophils Relative: 2.9 %
Eosinophils Relative: 3 %
HCT: 37.1 % (ref 35.0–45.0)
HCT: 37.6 % (ref 36.0–46.0)
Hemoglobin: 12.1 g/dL (ref 11.7–15.5)
Hemoglobin: 12.5 g/dL (ref 12.0–15.0)
Immature Granulocytes: 0 %
Lymphocytes Relative: 25 %
Lymphs Abs: 2219 cells/uL (ref 850–3900)
Lymphs Abs: 1.8 10*3/uL (ref 0.7–4.0)
MCH: 28.3 pg (ref 27.0–33.0)
MCH: 28.2 pg (ref 26.0–34.0)
MCHC: 32.6 g/dL (ref 32.0–36.0)
MCHC: 33.2 g/dL (ref 30.0–36.0)
MCV: 86.7 fL (ref 80.0–100.0)
MCV: 84.9 fL (ref 80.0–100.0)
MPV: 10 fL (ref 7.5–12.5)
Monocytes Absolute: 0.6 10*3/uL (ref 0.1–1.0)
Monocytes Relative: 8.9 %
Monocytes Relative: 8 %
Neutro Abs: 4431 cells/uL (ref 1500–7800)
Neutro Abs: 4.5 10*3/uL (ref 1.7–7.7)
Neutrophils Relative %: 58.3 %
Neutrophils Relative %: 63 %
Platelets: 263 10*3/uL (ref 140–400)
Platelets: 261 10*3/uL (ref 150–400)
RBC: 4.28 10*6/uL (ref 3.80–5.10)
RBC: 4.43 MIL/uL (ref 3.87–5.11)
RDW: 13.1 % (ref 11.0–15.0)
RDW: 13.2 % (ref 11.5–15.5)
Total Lymphocyte: 29.2 %
WBC: 7.6 10*3/uL (ref 3.8–10.8)
WBC: 7.1 10*3/uL (ref 4.0–10.5)
nRBC: 0 % (ref 0.0–0.2)

## 2021-12-06 LAB — COMPLETE METABOLIC PANEL WITH GFR
AG Ratio: 1.5 (calc) (ref 1.0–2.5)
ALT: 14 U/L (ref 6–29)
AST: 20 U/L (ref 10–35)
Albumin: 4.1 g/dL (ref 3.6–5.1)
Alkaline phosphatase (APISO): 75 U/L (ref 37–153)
BUN: 19 mg/dL (ref 7–25)
CO2: 20 mmol/L (ref 20–32)
Calcium: 9.3 mg/dL (ref 8.6–10.4)
Chloride: 92 mmol/L — ABNORMAL LOW (ref 98–110)
Creat: 0.93 mg/dL (ref 0.60–0.95)
Globulin: 2.8 g/dL (calc) (ref 1.9–3.7)
Glucose, Bld: 139 mg/dL (ref 65–139)
Potassium: 5 mmol/L (ref 3.5–5.3)
Sodium: 124 mmol/L — ABNORMAL LOW (ref 135–146)
Total Bilirubin: 0.3 mg/dL (ref 0.2–1.2)
Total Protein: 6.9 g/dL (ref 6.1–8.1)
eGFR: 60 mL/min/{1.73_m2} (ref >=60)

## 2021-12-06 LAB — HEMOGLOBIN A1C
Hgb A1c MFr Bld: 6.7 % of total Hgb — ABNORMAL HIGH (ref <5.7)
Mean Plasma Glucose: 146 mg/dL
eAG (mmol/L): 8.1 mmol/L

## 2021-12-06 NOTE — ED Triage Notes (Signed)
The pt was sent here from her doctors for a low sodium  and she has a painful lt leg  she speaks spanish  female with her speaks a little english

## 2021-12-06 NOTE — ED Provider Triage Note (Signed)
Emergency Medicine Provider Triage Evaluation Note  Joyce Gilmore , a 87 y.o. female  was evaluated in triage.  Pt complains of low sodium, was sent from primary care doctor because she had a low sodium, states she has had in the past, she has no other symptoms not endorsing headaches fevers chills generalized weakness no nausea vomiting diarrhea, she is not on diuretic medication, she has no other complaints..  Review of Systems  Positive: Low-sodium Negative: Fatigue, headaches  Physical Exam  BP (!) 145/67 (BP Location: Right Arm)   Pulse 67   Temp 98.4 F (36.9 C) (Oral)   Resp (!) 22   Ht '4\' 10"'$  (1.473 m)   Wt 71.5 kg   SpO2 98%   BMI 32.94 kg/m  Gen:   Awake, no distress   Resp:  Normal effort  MSK:   Moves extremities without difficulty  Other:    Medical Decision Making  Medically screening exam initiated at 4:37 PM.  Appropriate orders placed.  Matelyn Antonelli was informed that the remainder of the evaluation will be completed by another provider, this initial triage assessment does not replace that evaluation, and the importance of remaining in the ED until their evaluation is complete.  Lab work  been ordered will need further work-up.   Marcello Fennel, PA-C 12/06/21 1637

## 2021-12-07 LAB — I-STAT CHEM 8, ED
BUN: 15 mg/dL (ref 8–23)
Calcium, Ion: 1.06 mmol/L — ABNORMAL LOW (ref 1.15–1.40)
Chloride: 98 mmol/L (ref 98–111)
Creatinine, Ser: 0.7 mg/dL (ref 0.44–1.00)
Glucose, Bld: 154 mg/dL — ABNORMAL HIGH (ref 70–99)
HCT: 36 % (ref 36.0–46.0)
Hemoglobin: 12.2 g/dL (ref 12.0–15.0)
Potassium: 4.2 mmol/L (ref 3.5–5.1)
Sodium: 127 mmol/L — ABNORMAL LOW (ref 135–145)
TCO2: 20 mmol/L — ABNORMAL LOW (ref 22–32)

## 2021-12-07 MED ORDER — SODIUM CHLORIDE 0.9 % IV BOLUS
1000.0000 mL | Freq: Once | INTRAVENOUS | Status: AC
  Administered 2021-12-07: 1000 mL via INTRAVENOUS

## 2021-12-07 MED ORDER — SODIUM CHLORIDE 1 G PO TABS: 1.0000 g | ORAL_TABLET | Freq: Three times a day (TID) | ORAL | 0 refills | Status: DC

## 2021-12-07 MED ORDER — SODIUM CHLORIDE 0.9 % IV BOLUS
500.0000 mL | Freq: Once | INTRAVENOUS | Status: AC
  Administered 2021-12-07: 500 mL via INTRAVENOUS

## 2021-12-07 MED ORDER — OXYCODONE-ACETAMINOPHEN 5-325 MG PO TABS
2.0000 | ORAL_TABLET | Freq: Once | ORAL | Status: AC
  Administered 2021-12-07: 2 via ORAL
  Filled 2021-12-07: qty 2

## 2021-12-07 NOTE — ED Provider Notes (Signed)
[facility] EMERGENCY DEPARTMENT Provider Note   CSN: [id number] Arrival date & time: 12/06/21  1314     History  Chief Complaint  Patient presents with   abnormal labs    Joyce Gilmore is a 87 y.o. female.  87 year old female presents the ER today secondary to low sodium.  Patient daughter helps interpret and has not had her pain medicine so she is been in the ER for about 12 hours and now her leg is hurting as well.  She is actually scheduled for surgery at the end of the month for a laminectomy and this is when they were doing preop labs they found her sodium to be 124 so they sent her here for further evaluation.  She had low sodium in the past.  She does use salt on her food and is not on any diuretics but she does drink a whole lot of water.        Home Medications Prior to Admission medications   Medication Sig Start Date End Date Taking? Authorizing Provider  sodium chloride 1 g tablet Take 1 tablet (1 g total) by mouth 3 (three) times daily. 12/07/21  Yes Salil Raineri, Corene Cornea, MD  amLODipine (NORVASC) 5 MG tablet TAKE 1 TABLET(5 MG) BY MOUTH DAILY 12/05/21   Fargo, Amy E, NP  budesonide-formoterol (SYMBICORT) 160-4.5 MCG/ACT inhaler Inhale 2 puffs into the lungs 2 (two) times daily. 12/05/21   Fargo, Amy E, NP  furosemide (LASIX) 20 MG tablet Take 1 tablet (20 mg total) by mouth daily. 12/05/21   Fargo, Amy E, NP  glucose blood test strip Please dispense based on patient and insurance preference.  Use as directed to monitor FSBS four times daily.  Dx E11.22 01/28/21   Eulogio Bear, NP  HYDROcodone-acetaminophen (NORCO) 5-325 MG tablet Take 1 tablet by mouth 3 (three) times daily as needed for moderate pain. 09/04/21   Mcarthur Rossetti, MD  insulin aspart (NOVOLOG FLEXPEN) 100 UNIT/ML FlexPen ADMINISTER 5 TO 15 UNITS UNDER THE SKIN THREE TIMES DAILY WITH MEALS PER SLIDING SCALE 12/05/21   Fargo, Amy E, NP  insulin glargine (LANTUS SOLOSTAR)  100 UNIT/ML Solostar Pen INJECT  38 UNITS UNDER THE SKIN AT BEDTIME 12/05/21   Fargo, Amy E, NP  Insulin Pen Needle (PEN NEEDLES) 32G X 4 MM MISC 1 Device by Does not apply route 4 (four) times daily. E11.22 11/21/21   Yvonna Alanis, NP  Lancets MISC Please dispense based on patient and insurance preference.  Use as directed to monitor FSBS four times daily.  Dx E11.22 01/28/21   Eulogio Bear, NP  lansoprazole (PREVACID) 30 MG capsule TAKE ONE CAPSULE BY MOUTH DAILY 12/05/21   Fargo, Amy E, NP  lidocaine 4 % Place 1 patch onto the skin every 12 (twelve) hours. Patient not taking: Reported on 12/05/2021 08/29/21   Yvonna Alanis, NP  lisinopril (ZESTRIL) 10 MG tablet Take 1 tablet (10 mg total) by mouth daily. 12/05/21   Fargo, Amy E, NP  metoprolol tartrate (LOPRESSOR) 25 MG tablet TAKE 1/2 TABLET(12.5 MG) BY MOUTH TWICE DAILY 12/05/21   Fargo, Amy E, NP  naproxen (NAPROSYN) 500 MG tablet Take 1 tablet (500 mg total) by mouth 2 (two) times daily. 10/01/21   Dorothyann Peng, PA-C  pravastatin (PRAVACHOL) 20 MG tablet TAKE 1 TABLET(20 MG) BY MOUTH DAILY 12/05/21   Fargo, Amy E, NP  sucralfate (CARAFATE) 1 g tablet TAKE 1 TABLET(1 GRAM) BY MOUTH FOUR TIMES DAILY  11/07/20   Eulogio Bear, NP      Allergies    Bactrim [sulfamethoxazole-trimethoprim], Rifampin, and Vancomycin    Review of Systems   Review of Systems  Physical Exam Updated Vital Signs BP (!) 148/62   Pulse 60   Temp 98.1 F (36.7 C) (Oral)   Resp 18   Ht '4\' 10"'$  (1.473 m)   Wt 71.5 kg   SpO2 100%   BMI 32.94 kg/m  Physical Exam Vitals and nursing note reviewed.  Constitutional:      Appearance: She is well-developed.  HENT:     Head: Normocephalic and atraumatic.     Nose: No congestion or rhinorrhea.     Mouth/Throat:     Mouth: Mucous membranes are moist.     Pharynx: Oropharynx is clear.  Eyes:     Pupils: Pupils are equal, round, and reactive to light.  Cardiovascular:     Rate and Rhythm: Normal rate and  regular rhythm.  Pulmonary:     Effort: No respiratory distress.     Breath sounds: No stridor.  Abdominal:     General: Abdomen is flat. There is no distension.  Musculoskeletal:        General: No swelling or tenderness. Normal range of motion.     Cervical back: Normal range of motion.  Skin:    General: Skin is warm and dry.  Neurological:     General: No focal deficit present.     Mental Status: She is alert.     ED Results / Procedures / Treatments   Labs (all labs ordered are listed, but only abnormal results are displayed) Labs Reviewed  COMPREHENSIVE METABOLIC PANEL - Abnormal; Notable for the following components:      Result Value   Sodium 126 (*)    Chloride 95 (*)    CO2 20 (*)    Glucose, Bld 189 (*)    All other components within normal limits  I-STAT CHEM 8, ED - Abnormal; Notable for the following components:   Sodium 127 (*)    Glucose, Bld 154 (*)    Calcium, Ion 1.06 (*)    TCO2 20 (*)    All other components within normal limits  CBC WITH DIFFERENTIAL/PLATELET    EKG None  Radiology No results found.  Procedures Procedures    Medications Ordered in ED Medications  oxyCODONE-acetaminophen (PERCOCET/ROXICET) 5-325 MG per tablet 2 tablet (2 tablets Oral Given 12/07/21 0038)  sodium chloride 0.9 % bolus 1,000 mL (0 mLs Intravenous Stopped 12/07/21 0350)  sodium chloride 0.9 % bolus 500 mL (0 mLs Intravenous Stopped 12/07/21 0441)    ED Course/ Medical Decision Making/ A&P                           Medical Decision Making Amount and/or Complexity of Data Reviewed ECG/medicine tests: ordered.  Risk OTC drugs. Prescription drug management.   I suspect her acute discomfort is more related to her chronic pain from sciatica from her L4-L5 impingement.  Plan will be to give her a dose of pain medication along with a liter of fluids.  Will reevaluate.  Leg pain improved with home meds. Fluids given, Na improved slightly which is the goal.  Will initiate a few days of salt tabs at home. PCP follow up to recheck Na.    Final Clinical Impression(s) / ED Diagnoses Final diagnoses:  Hyponatremia    Rx / DC Orders ED Discharge  Orders          Ordered    sodium chloride 1 g tablet  3 times daily        12/07/21 0400              Theran Vandergrift, Corene Cornea, MD 12/07/21 2336

## 2021-12-11 ENCOUNTER — Telehealth: Payer: Self-pay

## 2021-12-11 NOTE — Telephone Encounter (Signed)
Transition Care Management Unsuccessful Follow-up Telephone Call  Date of discharge and from where:  [facility], 12/07/2021  Attempts:  1st Attempt  Reason for unsuccessful TCM follow-up call:  Unable to reach patient,patient has appointment scheduled for 12/12/2021

## 2021-12-12 ENCOUNTER — Ambulatory Visit: Payer: Medicaid Other | Admitting: Orthopedic Surgery

## 2021-12-12 ENCOUNTER — Encounter: Payer: Self-pay | Admitting: Orthopedic Surgery

## 2021-12-12 VITALS — BP 126/65 | HR 60 | Temp 96.7°F | Ht <= 58 in | Wt 158.4 lb

## 2021-12-12 DIAGNOSIS — E871 Hypo-osmolality and hyponatremia: Secondary | ICD-10-CM

## 2021-12-12 DIAGNOSIS — M5416 Radiculopathy, lumbar region: Secondary | ICD-10-CM | POA: Diagnosis not present

## 2021-12-12 DIAGNOSIS — J4540 Moderate persistent asthma, uncomplicated: Secondary | ICD-10-CM

## 2021-12-12 DIAGNOSIS — Z794 Long term (current) use of insulin: Secondary | ICD-10-CM

## 2021-12-12 DIAGNOSIS — I10 Essential (primary) hypertension: Secondary | ICD-10-CM

## 2021-12-12 DIAGNOSIS — E1122 Type 2 diabetes mellitus with diabetic chronic kidney disease: Secondary | ICD-10-CM

## 2021-12-12 DIAGNOSIS — N182 Chronic kidney disease, stage 2 (mild): Secondary | ICD-10-CM

## 2021-12-12 NOTE — Progress Notes (Signed)
Careteam: Patient Care Team: Yvonna Alanis, NP as PCP - General (Adult Health Nurse Practitioner) Mcarthur Rossetti, MD as Consulting Physician (Orthopedic Surgery)  Seen by: Windell Moulding, AGNP-C  PLACE OF SERVICE:  [place]  Advanced Directive information    Allergies  Allergen Reactions   Bactrim [Sulfamethoxazole-Trimethoprim] Other (See Comments)    Hyperkalemia (elevated potassium)   Rifampin Other (See Comments)    Severe thrombocytopenia (low blood platelet count)   Vancomycin Other (See Comments)    Severe thrombocytopenia (low blood platelet count)    Chief Complaint  Patient presents with   Acute Visit    ER follow up visit 12/11/2021     HPI: Patient is a 87 y.o. female seen today due to hyponatremia.   Interpreter and son present during encounter.   10/12 Na+ 124. Advised to present to ED for evaluation. 10/13 Na+ 126. Asymptomatic at the time. She was discharged with sodium tablets TID. Advised to f/u with PCP to recheck sodium level. She has been taking sodium tablets as prescribed. Denies fatigue, confusion, nausea, headaches, and increased thirst.   Back pain/left hip pain persists, radiation down left leg. MRI spine revealed subacute compression fracture L5, degenerative changes with marked stenosis L4-L5, foraminal stenosis L5-S1.Scheduled to have laminectomy by Dr. Carroll Kinds 10/30. Still taking hydrocodone for pain but does not think it helps much.     Review of Systems:  Review of Systems  Constitutional:  Negative for fever and malaise/fatigue.  HENT:  Negative for congestion.   Eyes:  Negative for blurred vision and double vision.  Respiratory:  Negative for cough, shortness of breath and wheezing.   Cardiovascular:  Negative for chest pain and leg swelling.  Gastrointestinal:  Negative for abdominal pain, constipation, diarrhea, heartburn, nausea and vomiting.  Genitourinary:  Negative for dysuria.  Musculoskeletal:  Positive for back  pain. Negative for falls.  Skin:  Negative for rash.  Neurological:  Negative for dizziness, weakness and headaches.  Psychiatric/Behavioral:  Negative for depression and memory loss. The patient is not nervous/anxious.     Past Medical History:  Diagnosis Date   Arthritis    Asthma    Per new patient form   CHF (congestive heart failure) ([place])    diastolic   Chronic kidney disease    hx of kidney stones,    Depression    hx of    Diabetes mellitus without complication (HCC)    Ganglion, left ankle and foot    Gout    Per new patient form   Hearing loss    Per new patient form   Hearing loss of both ears    Hypertension    MRSA infection    Oct 13 - Nov 13   Osteoporosis    Tinnitus of both ears    Past Surgical History:  Procedure Laterality Date   INCISION AND DRAINAGE HIP  12/22/2011   Procedure: IRRIGATION AND DEBRIDEMENT HIP;  Surgeon: Mcarthur Rossetti, MD;  Location: [facility];  Service: Orthopedics;  Laterality: Right;  Irrigation and debridement right hip   INCISION AND DRAINAGE HIP  12/27/2011   Procedure: IRRIGATION AND DEBRIDEMENT HIP;  Surgeon: Mcarthur Rossetti, MD;  Location: [facility];  Service: Orthopedics;  Laterality: Right;  Repeat irrigation and debridement   JOINT REPLACEMENT     bilateral knee, right hip    MASS EXCISION N/A 01/03/2020   Procedure: EXCISION OF HEMANGIOMA OF THE PALATE;  Surgeon: Leta Baptist, MD;  Location: [facility];  Service: ENT;  Laterality: N/A;   TOTAL HIP REVISION  12/05/2011   Procedure: TOTAL HIP REVISION;  Surgeon: Mcarthur Rossetti, MD;  Location: [facility];  Service: Orthopedics;  Laterality: Right;  Right Hip Revision Arthroplasty to Total Hip, Excision of Old Implant   Social History:   reports that she has never smoked. She has never used smokeless tobacco. She reports that she does not drink alcohol and does not use drugs.  Family History  Problem Relation Age of Onset   Hypertension Mother    Colon  cancer Neg Hx    Breast cancer Neg Hx     Medications: Patient's Medications  New Prescriptions   No medications on file  Previous Medications   AMLODIPINE (NORVASC) 5 MG TABLET    TAKE 1 TABLET(5 MG) BY MOUTH DAILY   BUDESONIDE-FORMOTEROL (SYMBICORT) 160-4.5 MCG/ACT INHALER    Inhale 2 puffs into the lungs 2 (two) times daily.   FUROSEMIDE (LASIX) 20 MG TABLET    Take 1 tablet (20 mg total) by mouth daily.   GLUCOSE BLOOD TEST STRIP    Please dispense based on patient and insurance preference.  Use as directed to monitor FSBS four times daily.  Dx E11.22   HYDROCODONE-ACETAMINOPHEN (NORCO) 5-325 MG TABLET    Take 1 tablet by mouth 3 (three) times daily as needed for moderate pain.   INSULIN ASPART (NOVOLOG FLEXPEN) 100 UNIT/ML FLEXPEN    ADMINISTER 5 TO 15 UNITS UNDER THE SKIN THREE TIMES DAILY WITH MEALS PER SLIDING SCALE   INSULIN GLARGINE (LANTUS SOLOSTAR) 100 UNIT/ML SOLOSTAR PEN    INJECT  38 UNITS UNDER THE SKIN AT BEDTIME   INSULIN PEN NEEDLE (PEN NEEDLES) 32G X 4 MM MISC    1 Device by Does not apply route 4 (four) times daily. E11.22   LANCETS MISC    Please dispense based on patient and insurance preference.  Use as directed to monitor FSBS four times daily.  Dx E11.22   LANSOPRAZOLE (PREVACID) 30 MG CAPSULE    TAKE ONE CAPSULE BY MOUTH DAILY   LIDOCAINE 4 %    Place 1 patch onto the skin every 12 (twelve) hours.   LISINOPRIL (ZESTRIL) 10 MG TABLET    Take 1 tablet (10 mg total) by mouth daily.   METOPROLOL TARTRATE (LOPRESSOR) 25 MG TABLET    TAKE 1/2 TABLET(12.5 MG) BY MOUTH TWICE DAILY   NAPROXEN (NAPROSYN) 500 MG TABLET    Take 1 tablet (500 mg total) by mouth 2 (two) times daily.   PRAVASTATIN (PRAVACHOL) 20 MG TABLET    TAKE 1 TABLET(20 MG) BY MOUTH DAILY   SODIUM CHLORIDE 1 G TABLET    Take 1 tablet (1 g total) by mouth 3 (three) times daily.   SUCRALFATE (CARAFATE) 1 G TABLET    TAKE 1 TABLET(1 GRAM) BY MOUTH FOUR TIMES DAILY  Modified Medications   No medications on  file  Discontinued Medications   No medications on file    Physical Exam:  There were no vitals filed for this visit. There is no height or weight on file to calculate BMI. Wt Readings from Last 3 Encounters:  12/06/21 157 lb 10.1 oz (71.5 kg)  12/05/21 157 lb 9.6 oz (71.5 kg)  08/29/21 158 lb (71.7 kg)    Physical Exam Vitals reviewed.  Constitutional:      General: She is not in acute distress. HENT:     Head: Normocephalic.  Eyes:  General:        Right eye: No discharge.        Left eye: No discharge.  Cardiovascular:     Rate and Rhythm: Normal rate and regular rhythm.     Pulses: Normal pulses.     Heart sounds: Normal heart sounds.  Pulmonary:     Effort: Pulmonary effort is normal. No respiratory distress.     Breath sounds: Normal breath sounds. No wheezing.  Abdominal:     General: Bowel sounds are normal. There is no distension.     Palpations: Abdomen is soft.     Tenderness: There is no abdominal tenderness.  Musculoskeletal:     Cervical back: Neck supple.     Right lower leg: No edema.     Left lower leg: No edema.  Skin:    General: Skin is warm and dry.     Capillary Refill: Capillary refill takes less than 2 seconds.  Neurological:     General: No focal deficit present.     Mental Status: She is alert and oriented to person, place, and time.     Motor: Weakness present.     Gait: Gait abnormal.     Comments: Walker/wheelchair  Psychiatric:        Mood and Affect: Mood normal.        Behavior: Behavior normal.     Labs reviewed: Basic Metabolic Panel: Recent Labs    08/29/21 1513 12/05/21 1422 12/06/21 1644 12/07/21 0350  NA 131* 124* 126* 127*  K 5.0 5.0 4.9 4.2  CL 97* 92* 95* 98  CO2 23 20 20*  --   GLUCOSE 71 139 189* 154*  BUN '18 19 17 15  '$ CREATININE 0.84 0.93 0.87 0.70  CALCIUM 9.4 9.3 9.4  --    Liver Function Tests: Recent Labs    08/29/21 1513 12/05/21 1422 12/06/21 1644  AST '18 20 23  '$ ALT '13 14 16  '$ ALKPHOS   --   --  73  BILITOT 0.3 0.3 0.3  PROT 6.9 6.9 6.8  ALBUMIN  --   --  3.8   No results for input(s): "LIPASE", "AMYLASE" in the last 8760 hours. No results for input(s): "AMMONIA" in the last 8760 hours. CBC: Recent Labs    08/29/21 1513 12/05/21 1422 12/06/21 1644 12/07/21 0350  WBC 8.0 7.6 7.1  --   NEUTROABS 4,424 4,431 4.5  --   HGB 12.8 12.1 12.5 12.2  HCT 39.3 37.1 37.6 36.0  MCV 84.0 86.7 84.9  --   PLT 196 263 261  --    Lipid Panel: Recent Labs    05/01/21 0808  CHOL 136  HDL 71  LDLCALC 48  TRIG 89  CHOLHDL 1.9   TSH: No results for input(s): "TSH" in the last 8760 hours. A1C: Lab Results  Component Value Date   HGBA1C 6.7 (H) 12/05/2021     Assessment/Plan 1. Hyponatremia - 10/13 Na+ 126> was 124 (10/120 - asymptomatic - sodium tablets complete today - bmp  2. Lumbar radiculopathy - ongoing - mechanical fall 6 months ago - MRI spine revealed subacute compression fracture L5, degenerative changes with marked stenosis L4-L5, foraminal stenosis L5-S1 - L-5 laminectomy by Dr. Carroll Kinds 10/30 - cont norco for pain  3. Essential hypertension - controlled - cont amlodipine and lisinopril  4. Moderate persistent asthmatic bronchitis without complication - no recent exacerbations - cont Symbicort  5. Type 2 diabetes mellitus with stage 2 chronic kidney disease, with long-term  current use of insulin (HCC) - A1c 6.7 (10/12)> was  6.4  - no recent hypoglycemias, sugars averaging 100-200's - cont Lantus/SSI/lisinopril/statin - urine microalbumin when she is able to ambulate better  Total time: 31 minutes. Greater than 50% of total time spent doing patient education regarding low sodium, ongoing back pain and health maintenance.     Next appt: 03/13/2022  Windell Moulding, [facility] [phone]

## 2021-12-12 NOTE — Patient Instructions (Signed)
May drink one normal sized Gatorade daily.

## 2021-12-13 ENCOUNTER — Other Ambulatory Visit: Payer: Self-pay | Admitting: Orthopedic Surgery

## 2021-12-13 ENCOUNTER — Telehealth: Payer: Self-pay | Admitting: Orthopedic Surgery

## 2021-12-13 LAB — BASIC METABOLIC PANEL
BUN: 15 mg/dL (ref 7–25)
CO2: 22 mmol/L (ref 20–32)
Calcium: 8.8 mg/dL (ref 8.6–10.4)
Chloride: 97 mmol/L — ABNORMAL LOW (ref 98–110)
Creat: 0.8 mg/dL (ref 0.60–0.95)
Glucose, Bld: 102 mg/dL — ABNORMAL HIGH (ref 65–99)
Potassium: 4.8 mmol/L (ref 3.5–5.3)
Sodium: 128 mmol/L — ABNORMAL LOW (ref 135–146)

## 2021-12-13 MED ORDER — SODIUM CHLORIDE 1 G PO TABS: 1.0000 g | ORAL_TABLET | Freq: Three times a day (TID) | ORAL | 0 refills | Status: AC

## 2021-12-13 MED ORDER — SODIUM CHLORIDE 1 G PO TABS: 1.0000 g | ORAL_TABLET | Freq: Three times a day (TID) | ORAL | 0 refills | Status: DC

## 2021-12-13 NOTE — Telephone Encounter (Signed)
BMP planned to be done at preop 10/24 per Dr. Kathyrn Sheriff.

## 2021-12-16 NOTE — Progress Notes (Signed)
Surgical Instructions    Your procedure is scheduled on Monday, 12/23/21.  Report to [facility] Main Entrance "A" at 5:30 A.M., then check in with the Admitting office.  Call this number if you have problems the morning of surgery:  [phone]   If you have any questions prior to your surgery date call [phone]: Open Monday-Friday 8am-4pm If you experience any cold or flu symptoms such as cough, fever, chills, shortness of breath, etc. between now and your scheduled surgery, please notify us at the above number     Remember:  Do not eat or drink after midnight the night before your surgery      Take these medicines the morning of surgery with A SIP OF WATER:  amLODipine (NORVASC) lansoprazole (PREVACID metoprolol tartrate (LOPRESSOR) pravastatin (PRAVACHOL)  sodium chloride sucralfate (CARAFATE)  IF NEEDED: acetaminophen (TYLENOL)  budesonide-formoterol (SYMBICORT)  HYDROcodone-acetaminophen (NORCO)   As of today, STOP taking any Aspirin (unless otherwise instructed by your surgeon) Aleve, Naproxen, Ibuprofen, Motrin, Advil, Goody's, BC's, all herbal medications, fish oil, and all vitamins.  WHAT DO I DO ABOUT MY DIABETES MEDICATION?   Do not take oral diabetes medicines (pills) the morning of surgery.  THE NIGHT BEFORE SURGERY, take 16 units (50%) of insulin glargine (LANTUS SOLOSTAR).     The day of surgery, do not take other diabetes injectables, including Byetta (exenatide), Bydureon (exenatide ER), Victoza (liraglutide), or Trulicity (dulaglutide).  The day of surgery, if your CBG is greater than 220 mg/dL, you may take  of your sliding scale (insulin aspart (NOVOLOG FLEXPEN)) dose of insulin.   HOW TO MANAGE YOUR DIABETES BEFORE AND AFTER SURGERY  Why is it important to control my blood sugar before and after surgery? Improving blood sugar levels before and after surgery helps healing and can limit problems. A way of improving blood sugar control is  eating a healthy diet by:  Eating less sugar and carbohydrates  Increasing activity/exercise  Talking with your doctor about reaching your blood sugar goals High blood sugars (greater than 180 mg/dL) can raise your risk of infections and slow your recovery, so you will need to focus on controlling your diabetes during the weeks before surgery. Make sure that the doctor who takes care of your diabetes knows about your planned surgery including the date and location.  How do I manage my blood sugar before surgery? Check your blood sugar at least 4 times a day, starting 2 days before surgery, to make sure that the level is not too high or low.  Check your blood sugar the morning of your surgery when you wake up and every 2 hours until you get to the Short Stay unit.  If your blood sugar is less than 70 mg/dL, you will need to treat for low blood sugar: Do not take insulin. Treat a low blood sugar (less than 70 mg/dL) with  cup of clear juice (cranberry or apple), 4 glucose tablets, OR glucose gel. Recheck blood sugar in 15 minutes after treatment (to make sure it is greater than 70 mg/dL). If your blood sugar is not greater than 70 mg/dL on recheck, call [phone] for further instructions. Report your blood sugar to the short stay nurse when you get to Short Stay.  If you are admitted to the hospital after surgery: Your blood sugar will be checked by the staff and you will probably be given insulin after surgery (instead of oral diabetes medicines) to make sure you have good blood sugar levels. The goal  for blood sugar control after surgery is 80-180 mg/dL.           Do not wear jewelry or makeup. Do not wear lotions, powders, perfumesor deodorant. Do not shave 48 hours prior to surgery.   Do not bring valuables to the hospital. Do not wear nail polish, gel polish, artificial nails, or any other type of covering on natural nails (fingers and toes) If you have artificial nails or gel  coating that need to be removed by a nail salon, please have this removed prior to surgery. Artificial nails or gel coating may interfere with anesthesia's ability to adequately monitor your vital signs.  [facility] is not responsible for any belongings or valuables.    Do NOT Smoke (Tobacco/Vaping)  24 hours prior to your procedure  If you use a CPAP at night, you may bring your mask for your overnight stay.   Contacts, glasses, hearing aids, dentures or partials may not be worn into surgery, please bring cases for these belongings   For patients admitted to the hospital, discharge time will be determined by your treatment team.   Patients discharged the day of surgery will not be allowed to drive home, and someone needs to stay with them for 24 hours.   SURGICAL WAITING ROOM VISITATION Patients having surgery or a procedure may have no more than 2 support people in the waiting area - these visitors may rotate.   Children under the age of 93 must have an adult with them who is not the patient. If the patient needs to stay at the hospital during part of their recovery, the visitor guidelines for inpatient rooms apply. Pre-op nurse will coordinate an appropriate time for 1 support person to accompany patient in pre-op.  This support person may not rotate.   Please refer to [email or web address] for the visitor guidelines for Inpatients (after your surgery is over and you are in a regular room).    Special instructions:    Oral Hygiene is also important to reduce your risk of infection.  Remember - BRUSH YOUR TEETH THE MORNING OF SURGERY WITH YOUR REGULAR TOOTHPASTE   - Preparing For Surgery  Before surgery, you can play an important role. Because skin is not sterile, your skin needs to be as free of germs as possible. You can reduce the number of germs on your skin by washing with CHG (chlorahexidine gluconate) Soap before  surgery.  CHG is an antiseptic cleaner which kills germs and bonds with the skin to continue killing germs even after washing.     Please do not use if you have an allergy to CHG or antibacterial soaps. If your skin becomes reddened/irritated stop using the CHG.  Do not shave (including legs and underarms) for at least 48 hours prior to first CHG shower. It is OK to shave your face.  Please follow these instructions carefully.     Shower the NIGHT BEFORE SURGERY and the MORNING OF SURGERY with CHG Soap.   If you chose to wash your hair, wash your hair first as usual with your normal shampoo. After you shampoo, rinse your hair and body thoroughly to remove the shampoo.  Then [employer] and genitals (private parts) with your normal soap and rinse thoroughly to remove soap.  After that Use CHG Soap as you would any other liquid soap. You can apply CHG directly to the skin and wash gently with a scrungie or a clean washcloth.   Apply the CHG Soap  to your body ONLY FROM THE NECK DOWN.  Do not use on open wounds or open sores. Avoid contact with your eyes, ears, mouth and genitals (private parts). Wash Face and genitals (private parts)  with your normal soap.   Wash thoroughly, paying special attention to the area where your surgery will be performed.  Thoroughly rinse your body with warm water from the neck down.  DO NOT shower/wash with your normal soap after using and rinsing off the CHG Soap.  Pat yourself dry with a CLEAN TOWEL.  Wear CLEAN PAJAMAS to bed the night before surgery  Place CLEAN SHEETS on your bed the night before your surgery  DO NOT SLEEP WITH PETS.   Day of Surgery: Take a shower with CHG soap. Wear Clean/Comfortable clothing the morning of surgery Do not apply any deodorants/lotions.   Remember to brush your teeth WITH YOUR REGULAR TOOTHPASTE.    If you received a COVID test during your pre-op visit, it is requested that you wear a mask when out in public, stay  away from anyone that may not be feeling well, and notify your surgeon if you develop symptoms. If you have been in contact with anyone that has tested positive in the last 10 days, please notify your surgeon.    Please read over the following fact sheets that you were given.

## 2021-12-17 ENCOUNTER — Other Ambulatory Visit: Payer: Self-pay | Admitting: Orthopedic Surgery

## 2021-12-17 ENCOUNTER — Encounter (HOSPITAL_COMMUNITY): Payer: Self-pay

## 2021-12-17 ENCOUNTER — Encounter (HOSPITAL_COMMUNITY)
Admission: RE | Admit: 2021-12-17 | Discharge: 2021-12-17 | Disposition: A | Discharge status: Home / Self Care | Payer: Medicaid Other | Source: Ambulatory Visit | Attending: Neurosurgery | Admitting: Neurosurgery

## 2021-12-17 ENCOUNTER — Other Ambulatory Visit: Payer: Self-pay

## 2021-12-17 VITALS — BP 160/60 | HR 66 | Temp 98.1°F | Resp 18 | Ht <= 58 in | Wt 158.1 lb

## 2021-12-17 DIAGNOSIS — I129 Hypertensive chronic kidney disease with stage 1 through stage 4 chronic kidney disease, or unspecified chronic kidney disease: Secondary | ICD-10-CM | POA: Insufficient documentation

## 2021-12-17 DIAGNOSIS — E1122 Type 2 diabetes mellitus with diabetic chronic kidney disease: Secondary | ICD-10-CM | POA: Insufficient documentation

## 2021-12-17 DIAGNOSIS — N183 Chronic kidney disease, stage 3 unspecified: Secondary | ICD-10-CM | POA: Insufficient documentation

## 2021-12-17 DIAGNOSIS — Z01812 Encounter for preprocedural laboratory examination: Secondary | ICD-10-CM | POA: Insufficient documentation

## 2021-12-17 DIAGNOSIS — Z01818 Encounter for other preprocedural examination: Secondary | ICD-10-CM

## 2021-12-17 DIAGNOSIS — R062 Wheezing: Secondary | ICD-10-CM

## 2021-12-17 LAB — SURGICAL PCR SCREEN
MRSA, PCR: NEGATIVE
Staphylococcus aureus: NEGATIVE

## 2021-12-17 LAB — BASIC METABOLIC PANEL
Anion gap: 11 (ref 5–15)
BUN: 15 mg/dL (ref 8–23)
CO2: 23 mmol/L (ref 22–32)
Calcium: 9.5 mg/dL (ref 8.9–10.3)
Chloride: 96 mmol/L — ABNORMAL LOW (ref 98–111)
Creatinine, Ser: 0.83 mg/dL (ref 0.44–1.00)
GFR, Estimated: >60 mL/min (ref >60)
Glucose, Bld: 136 mg/dL — ABNORMAL HIGH (ref 70–99)
Potassium: 4.2 mmol/L (ref 3.5–5.1)
Sodium: 130 mmol/L — ABNORMAL LOW (ref 135–145)

## 2021-12-17 LAB — GLUCOSE, CAPILLARY: Glucose-Capillary: 169 mg/dL — ABNORMAL HIGH (ref 70–99)

## 2021-12-17 MED ORDER — ALBUTEROL SULFATE HFA 108 (90 BASE) MCG/ACT IN AERS: 2.0000 | INHALATION_SPRAY | Freq: Four times a day (QID) | RESPIRATORY_TRACT | 0 refills | Status: AC | PRN

## 2021-12-17 NOTE — Progress Notes (Signed)
Anesthesia Chart Review:  Evaluated patient at PAT appointment for report of wheezing.  Evaluation was completed with assistance of a Patent attorney.  She is noted to have moderate persistent asthma is maintained on Symbicort. She is in obvious discomfort due to her left lower extremity radicular pain.  She is in a wheelchair due to this.  She is constantly shifting in her chair secondary to pain.  She has an audible wheeze.  She is able to speak in full sentences.  Denies cough.  Denies other signs or symptoms of illness.  She states that she feels her breathing is currently at baseline. Family member with patient also expressed pt at baseline.  States she did use her Symbicort today.  Is unclear whether she has additional rescue inhaler, she does describe what sounds like a nebulizer, but it is not clear if she is using this.  She has no albuterol on her med rec.  Oxygen saturation 99% on arrival, heart rate 66, blood pressure 160/60.  On auscultation she has diffuse expiratory wheezing.  I did reach out to her PCP Windell Moulding, NP who has recently been following the patient closely for hyponatremia and evaluated the patient on 10/12 and 12/12/2021 (patient was started on sodium chloride replacement tablets at that time).  No wheezing noted at those visits.  She stated she would send prescription for albuterol inhaler.  Preop RN [place] Hoefler reached out to patient's daughter to advise the prescription has been sent and the patient should use as prescribed and she should contact her PCP if she experiences any worsening of her symptoms.  IDDM2, last A1c 6.7 on 12/05/21.  Preop BMP reviewed, notable for hyponatremia sodium 130 which is an improvement from prior (see below), otherwise unremarkable.  CBC from 12/06/2021 reviewed, WNL.  Sodium (mmol/L)  Date Value  12/17/2021 130 (L)  12/12/2021 128 (L)  12/07/2021 127 (L)  12/06/2021 126 (L)  12/05/2021 124 (L)    EKG 12/07/21: Sinus rhythm with 1st  degree A-V block. Rate 64.  TTE 10/14/2015: Left ventricle:  The cavity size was normal. Wall thickness was  normal. Systolic function was normal. The estimated ejection  fraction was in the range of 60% to 65%. Wall motion was normal;  there were no regional wall motion abnormalities. Doppler  parameters are consistent with abnormal left ventricular relaxation  (grade 1 diastolic dysfunction).    Wynonia Musty [facility] Short Stay Center/Anesthesiology Phone [phone] 12/20/2021 3:52 PM

## 2021-12-17 NOTE — Progress Notes (Signed)
PCP - Amy Fargo Cardiologist - denies  PPM/ICD - denies   Chest x-ray - n/a EKG - 12/09/21 Stress Test - denies ECHO - 10/14/15 Cardiac Cath - denies  Sleep Study - denies   Fasting Blood Sugar - 120 Checks Blood Sugar once a day    Follow your surgeon's instructions on when to stop Aspirin.  If no instructions were given by your surgeon then you will need to call the office to get those instructions.     ERAS Protcol -no   COVID TEST- not needed   Anesthesia review: yes, sodium 128 at last pcp appt on 12/12/21. Wheezing during PAT appt- james came to room to listen  Patient denies shortness of breath, fever, cough and chest pain at PAT appointment   All instructions explained to the patient, with a verbal understanding of the material. Patient agrees to go over the instructions while at home for a better understanding. Patient also instructed to self quarantine after being tested for COVID-19. The opportunity to ask questions was provided.

## 2021-12-19 ENCOUNTER — Other Ambulatory Visit: Payer: Self-pay | Admitting: Nurse Practitioner

## 2021-12-19 ENCOUNTER — Ambulatory Visit: Payer: Medicaid Other

## 2021-12-19 DIAGNOSIS — I10 Essential (primary) hypertension: Secondary | ICD-10-CM

## 2021-12-19 DIAGNOSIS — J4540 Moderate persistent asthma, uncomplicated: Secondary | ICD-10-CM

## 2021-12-19 DIAGNOSIS — E1122 Type 2 diabetes mellitus with diabetic chronic kidney disease: Secondary | ICD-10-CM

## 2021-12-19 DIAGNOSIS — R062 Wheezing: Secondary | ICD-10-CM

## 2021-12-20 NOTE — Anesthesia Preprocedure Evaluation (Signed)
Anesthesia Evaluation  Patient identified by MRN, date of birth, ID band Patient awake  General Assessment Comment:History noted Dr. Nyoka Cowden  Reviewed: Allergy & Precautions, NPO status , Patient's Chart, lab work & pertinent test results  Airway Mallampati: II       Dental   Pulmonary neg pulmonary ROS, asthma    breath sounds clear to auscultation       Cardiovascular hypertension, +CHF   Rhythm:Regular Rate:Normal     Neuro/Psych    GI/Hepatic Neg liver ROS,GERD  ,,  Endo/Other  diabetes    Renal/GU Renal disease     Musculoskeletal  (+) Arthritis ,    Abdominal   Peds  Hematology   Anesthesia Other Findings   Reproductive/Obstetrics                             Anesthesia Physical Anesthesia Plan  ASA: 3  Anesthesia Plan: General   Post-op Pain Management: Tylenol PO (pre-op)*   Induction: Intravenous  PONV Risk Score and Plan: 3 and Ondansetron, Dexamethasone and Midazolam  Airway Management Planned: Oral ETT  Additional Equipment:   Intra-op Plan:   Post-operative Plan: Possible Post-op intubation/ventilation  Informed Consent: I have reviewed the patients History and Physical, chart, labs and discussed the procedure including the risks, benefits and alternatives for the proposed anesthesia with the patient or authorized representative who has indicated his/her understanding and acceptance.     Dental advisory given  Plan Discussed with: CRNA and Anesthesiologist  Anesthesia Plan Comments: (PAT note by Karoline Caldwell, PA-C: Evaluated patient at PAT appointment for report of wheezing.  Evaluation was completed with assistance of a Patent attorney.  She is noted to have moderate persistent asthma is maintained on Symbicort. She is in obvious discomfort due to her left lower extremity radicular pain.  She is in a wheelchair due to this.  She is constantly shifting in her  chair secondary to pain.  She has an audible wheeze.  She is able to speak in full sentences.  Denies cough.  Denies other signs or symptoms of illness.  She states that she feels her breathing is currently at baseline. Family member with patient also expressed pt at baseline.  States she did use her Symbicort today.  Is unclear whether she has additional rescue inhaler, she does describe what sounds like a nebulizer, but it is not clear if she is using this.  She has no albuterol on her med rec.  Oxygen saturation 99% on arrival, heart rate 66, blood pressure 160/60.  On auscultation she has diffuse expiratory wheezing.  I did reach out to her PCP Windell Moulding, NP who has recently been following the patient closely for hyponatremia and evaluated the patient on 10/12 and 12/12/2021 (patient was started on sodium chloride replacement tablets at that time).  No wheezing noted at those visits.  She stated she would send prescription for albuterol inhaler.  Preop RN [place] Hoefler reached out to patient's daughter to advise the prescription has been sent and the patient should use as prescribed and she should contact her PCP if she experiences any worsening of her symptoms.  IDDM2, last A1c 6.7 on 12/05/21.  Preop BMP reviewed, notable for hyponatremia sodium 130 which is an improvement from prior (see below), otherwise unremarkable.  CBC from 12/06/2021 reviewed, WNL.  Sodium (mmol/L) Date Value 12/17/2021 130 (L) 12/12/2021 128 (L) 12/07/2021 127 (L) 12/06/2021 126 (L) 12/05/2021 124 (L)   EKG  12/07/21: Sinus rhythm with 1st degree A-V block. Rate 64.  TTE 10/14/2015: Left ventricle: The cavity size was normal. Wall thickness was  normal. Systolic function was normal. The estimated ejection  fraction was in the range of 60% to 65%. Wall motion was normal;  there were no regional wall motion abnormalities. Doppler  parameters are consistent with abnormal left ventricular relaxation  (grade 1  diastolic dysfunction). )        Anesthesia Quick Evaluation

## 2021-12-22 MED ORDER — FENTANYL CITRATE (PF) 100 MCG/2ML IJ SOLN
INTRAMUSCULAR | Status: AC
  Filled 2021-12-22: qty 2

## 2021-12-22 MED ORDER — OXYTOCIN-SODIUM CHLORIDE 30-0.9 UT/500ML-% IV SOLN
INTRAVENOUS | Status: AC
  Filled 2021-12-22: qty 500

## 2021-12-22 MED ORDER — DEXAMETHASONE SODIUM PHOSPHATE 4 MG/ML IJ SOLN
INTRAMUSCULAR | Status: AC
  Filled 2021-12-22: qty 1

## 2021-12-22 MED ORDER — PHENYLEPHRINE 80 MCG/ML (10ML) SYRINGE FOR IV PUSH (FOR BLOOD PRESSURE SUPPORT)
PREFILLED_SYRINGE | INTRAVENOUS | Status: AC
  Filled 2021-12-22: qty 10

## 2021-12-22 MED ORDER — MORPHINE SULFATE (PF) 0.5 MG/ML IJ SOLN
INTRAMUSCULAR | Status: AC
  Filled 2021-12-22: qty 10

## 2021-12-22 MED ORDER — ACETAMINOPHEN 10 MG/ML IV SOLN
INTRAVENOUS | Status: AC
  Filled 2021-12-22: qty 100

## 2021-12-22 MED ORDER — PHENYLEPHRINE HCL-NACL 20-0.9 MG/250ML-% IV SOLN
INTRAVENOUS | Status: AC
  Filled 2021-12-22: qty 250

## 2021-12-22 MED ORDER — ONDANSETRON HCL 4 MG/2ML IJ SOLN
INTRAMUSCULAR | Status: AC
  Filled 2021-12-22: qty 2

## 2021-12-22 MED ORDER — DEXMEDETOMIDINE HCL IN NACL 80 MCG/20ML IV SOLN
INTRAVENOUS | Status: AC
  Filled 2021-12-22: qty 20

## 2021-12-23 ENCOUNTER — Ambulatory Visit (HOSPITAL_COMMUNITY): Payer: Medicaid Other

## 2021-12-23 ENCOUNTER — Ambulatory Visit (HOSPITAL_COMMUNITY): Payer: Medicaid Other | Admitting: Physician Assistant

## 2021-12-23 ENCOUNTER — Encounter (HOSPITAL_COMMUNITY)
Admission: RE | Disposition: A | Discharge status: Home / Self Care | Payer: Self-pay | Source: Home / Self Care | Attending: Neurosurgery

## 2021-12-23 ENCOUNTER — Encounter (HOSPITAL_COMMUNITY): Payer: Self-pay | Admitting: Neurosurgery

## 2021-12-23 ENCOUNTER — Other Ambulatory Visit: Payer: Self-pay

## 2021-12-23 ENCOUNTER — Ambulatory Visit (HOSPITAL_BASED_OUTPATIENT_CLINIC_OR_DEPARTMENT_OTHER): Payer: Medicaid Other | Admitting: Anesthesiology

## 2021-12-23 ENCOUNTER — Observation Stay (HOSPITAL_COMMUNITY)
Admission: RE | Admit: 2021-12-23 | Discharge: 2021-12-23 | Disposition: A | Discharge status: Home / Self Care | Payer: Medicaid Other | Attending: Neurosurgery | Admitting: Neurosurgery

## 2021-12-23 DIAGNOSIS — I110 Hypertensive heart disease with heart failure: Secondary | ICD-10-CM | POA: Diagnosis not present

## 2021-12-23 DIAGNOSIS — N189 Chronic kidney disease, unspecified: Secondary | ICD-10-CM | POA: Insufficient documentation

## 2021-12-23 DIAGNOSIS — J45909 Unspecified asthma, uncomplicated: Secondary | ICD-10-CM | POA: Insufficient documentation

## 2021-12-23 DIAGNOSIS — E1122 Type 2 diabetes mellitus with diabetic chronic kidney disease: Secondary | ICD-10-CM | POA: Diagnosis not present

## 2021-12-23 DIAGNOSIS — Z96653 Presence of artificial knee joint, bilateral: Secondary | ICD-10-CM | POA: Diagnosis not present

## 2021-12-23 DIAGNOSIS — I130 Hypertensive heart and chronic kidney disease with heart failure and stage 1 through stage 4 chronic kidney disease, or unspecified chronic kidney disease: Secondary | ICD-10-CM | POA: Insufficient documentation

## 2021-12-23 DIAGNOSIS — I509 Heart failure, unspecified: Secondary | ICD-10-CM | POA: Insufficient documentation

## 2021-12-23 DIAGNOSIS — Z79899 Other long term (current) drug therapy: Secondary | ICD-10-CM | POA: Diagnosis not present

## 2021-12-23 DIAGNOSIS — M5416 Radiculopathy, lumbar region: Secondary | ICD-10-CM

## 2021-12-23 DIAGNOSIS — M4726 Other spondylosis with radiculopathy, lumbar region: Secondary | ICD-10-CM | POA: Diagnosis present

## 2021-12-23 DIAGNOSIS — Z794 Long term (current) use of insulin: Secondary | ICD-10-CM | POA: Insufficient documentation

## 2021-12-23 DIAGNOSIS — M48061 Spinal stenosis, lumbar region without neurogenic claudication: Principal | ICD-10-CM | POA: Insufficient documentation

## 2021-12-23 HISTORY — PX: LUMBAR LAMINECTOMY/DECOMPRESSION MICRODISCECTOMY: SHX5026

## 2021-12-23 LAB — GLUCOSE, CAPILLARY
Glucose-Capillary: 152 mg/dL — ABNORMAL HIGH (ref 70–99)
Glucose-Capillary: 135 mg/dL — ABNORMAL HIGH (ref 70–99)
Glucose-Capillary: 352 mg/dL — ABNORMAL HIGH (ref 70–99)

## 2021-12-23 SURGERY — LUMBAR LAMINECTOMY/DECOMPRESSION MICRODISCECTOMY 1 LEVEL
Anesthesia: General | Site: Back | Laterality: Left

## 2021-12-23 MED ORDER — ACETAMINOPHEN 650 MG RE SUPP: 650.0000 mg | RECTAL | Status: DC | PRN

## 2021-12-23 MED ORDER — ACETAMINOPHEN 325 MG PO TABS
650.0000 mg | ORAL_TABLET | ORAL | Status: DC | PRN
  Administered 2021-12-23: 650 mg via ORAL
  Filled 2021-12-23: qty 2

## 2021-12-23 MED ORDER — INSULIN GLARGINE-YFGN 100 UNIT/ML ~~LOC~~ SOLN
38.0000 [IU] | Freq: Every day | SUBCUTANEOUS | Status: DC
  Filled 2021-12-23: qty 0.38

## 2021-12-23 MED ORDER — PHENOL 1.4 % MT LIQD: 1.0000 | OROMUCOSAL | Status: DC | PRN

## 2021-12-23 MED ORDER — POLYETHYLENE GLYCOL 3350 17 G PO PACK: 17.0000 g | PACK | Freq: Every day | ORAL | Status: DC | PRN

## 2021-12-23 MED ORDER — AMLODIPINE BESYLATE 5 MG PO TABS: 5.0000 mg | ORAL_TABLET | Freq: Every day | ORAL | Status: DC

## 2021-12-23 MED ORDER — OXYCODONE HCL 5 MG PO TABS: 10.0000 mg | ORAL_TABLET | ORAL | Status: DC | PRN

## 2021-12-23 MED ORDER — LISINOPRIL 10 MG PO TABS
10.0000 mg | ORAL_TABLET | Freq: Every day | ORAL | Status: DC
  Administered 2021-12-23: 10 mg via ORAL
  Filled 2021-12-23: qty 1

## 2021-12-23 MED ORDER — ONDANSETRON HCL 4 MG PO TABS: 4.0000 mg | ORAL_TABLET | Freq: Four times a day (QID) | ORAL | Status: DC | PRN

## 2021-12-23 MED ORDER — OXYCODONE HCL 5 MG PO TABS: 5.0000 mg | ORAL_TABLET | ORAL | Status: DC | PRN

## 2021-12-23 MED ORDER — SENNA 8.6 MG PO TABS: 1.0000 | ORAL_TABLET | Freq: Two times a day (BID) | ORAL | Status: DC

## 2021-12-23 MED ORDER — INSULIN ASPART 100 UNIT/ML FLEXPEN: 6.0000 [IU] | PEN_INJECTOR | Freq: Three times a day (TID) | SUBCUTANEOUS | Status: DC

## 2021-12-23 MED ORDER — FENTANYL CITRATE (PF) 100 MCG/2ML IJ SOLN
INTRAMUSCULAR | Status: AC
  Filled 2021-12-23: qty 2

## 2021-12-23 MED ORDER — LACTATED RINGERS IV SOLN: INTRAVENOUS | Status: DC

## 2021-12-23 MED ORDER — CHLORHEXIDINE GLUCONATE 0.12 % MT SOLN: 15.0000 mL | Freq: Once | OROMUCOSAL | Status: AC

## 2021-12-23 MED ORDER — SUCRALFATE 1 G PO TABS: 1.0000 g | ORAL_TABLET | Freq: Three times a day (TID) | ORAL | Status: DC

## 2021-12-23 MED ORDER — ROCURONIUM BROMIDE 10 MG/ML (PF) SYRINGE
PREFILLED_SYRINGE | INTRAVENOUS | Status: AC
  Filled 2021-12-23: qty 10

## 2021-12-23 MED ORDER — LIDOCAINE 2% (20 MG/ML) 5 ML SYRINGE
INTRAMUSCULAR | Status: DC | PRN
  Administered 2021-12-23: 100 mg via INTRAVENOUS

## 2021-12-23 MED ORDER — CEFAZOLIN SODIUM-DEXTROSE 2-4 GM/100ML-% IV SOLN
2.0000 g | INTRAVENOUS | Status: AC
  Administered 2021-12-23: 2 g via INTRAVENOUS

## 2021-12-23 MED ORDER — MORPHINE SULFATE (PF) 2 MG/ML IV SOLN: 2.0000 mg | INTRAVENOUS | Status: DC | PRN

## 2021-12-23 MED ORDER — PROPOFOL 10 MG/ML IV BOLUS
INTRAVENOUS | Status: AC
  Filled 2021-12-23: qty 20

## 2021-12-23 MED ORDER — ALBUTEROL SULFATE (2.5 MG/3ML) 0.083% IN NEBU: 2.5000 mg | INHALATION_SOLUTION | Freq: Four times a day (QID) | RESPIRATORY_TRACT | Status: DC | PRN

## 2021-12-23 MED ORDER — SODIUM CHLORIDE 0.9 % IV SOLN: INTRAVENOUS | Status: DC

## 2021-12-23 MED ORDER — MENTHOL 3 MG MT LOZG: 1.0000 | LOZENGE | OROMUCOSAL | Status: DC | PRN

## 2021-12-23 MED ORDER — LIDOCAINE-EPINEPHRINE 1 %-1:100000 IJ SOLN
INTRAMUSCULAR | Status: DC | PRN
  Administered 2021-12-23: 8 mL

## 2021-12-23 MED ORDER — 0.9 % SODIUM CHLORIDE (POUR BTL) OPTIME
TOPICAL | Status: DC | PRN
  Administered 2021-12-23: 1000 mL

## 2021-12-23 MED ORDER — DEXAMETHASONE SODIUM PHOSPHATE 10 MG/ML IJ SOLN
INTRAMUSCULAR | Status: AC
  Filled 2021-12-23: qty 1

## 2021-12-23 MED ORDER — THROMBIN 5000 UNITS EX SOLR
CUTANEOUS | Status: AC
  Filled 2021-12-23: qty 10000

## 2021-12-23 MED ORDER — PANTOPRAZOLE SODIUM 20 MG PO TBEC
20.0000 mg | DELAYED_RELEASE_TABLET | Freq: Every day | ORAL | Status: DC
  Administered 2021-12-23: 20 mg via ORAL
  Filled 2021-12-23: qty 1

## 2021-12-23 MED ORDER — HYDROCODONE-ACETAMINOPHEN 5-325 MG PO TABS: 1.0000 | ORAL_TABLET | Freq: Four times a day (QID) | ORAL | 0 refills | Status: AC | PRN

## 2021-12-23 MED ORDER — PHENYLEPHRINE 80 MCG/ML (10ML) SYRINGE FOR IV PUSH (FOR BLOOD PRESSURE SUPPORT)
PREFILLED_SYRINGE | INTRAVENOUS | Status: DC | PRN
  Administered 2021-12-23 (×2): 80 ug via INTRAVENOUS
  Administered 2021-12-23: 160 ug via INTRAVENOUS

## 2021-12-23 MED ORDER — ONDANSETRON HCL 4 MG/2ML IJ SOLN
INTRAMUSCULAR | Status: AC
  Filled 2021-12-23: qty 2

## 2021-12-23 MED ORDER — SODIUM CHLORIDE 0.9 % IV SOLN: 250.0000 mL | INTRAVENOUS | Status: DC

## 2021-12-23 MED ORDER — THROMBIN 5000 UNITS EX SOLR
CUTANEOUS | Status: DC | PRN
  Administered 2021-12-23 (×2): 5000 [IU] via TOPICAL

## 2021-12-23 MED ORDER — CHLORHEXIDINE GLUCONATE 0.12 % MT SOLN
OROMUCOSAL | Status: AC
  Administered 2021-12-23: 15 mL via OROMUCOSAL
  Filled 2021-12-23: qty 15

## 2021-12-23 MED ORDER — SODIUM CHLORIDE 1 G PO TABS
1.0000 g | ORAL_TABLET | Freq: Three times a day (TID) | ORAL | Status: DC
  Filled 2021-12-23 (×2): qty 1

## 2021-12-23 MED ORDER — FLEET ENEMA 7-19 GM/118ML RE ENEM: 1.0000 | ENEMA | Freq: Once | RECTAL | Status: DC | PRN

## 2021-12-23 MED ORDER — THROMBIN 5000 UNITS EX SOLR
CUTANEOUS | Status: AC
  Filled 2021-12-23: qty 5000

## 2021-12-23 MED ORDER — DEXAMETHASONE SODIUM PHOSPHATE 10 MG/ML IJ SOLN
INTRAMUSCULAR | Status: DC | PRN
  Administered 2021-12-23: 5 mg via INTRAVENOUS

## 2021-12-23 MED ORDER — DOCUSATE SODIUM 100 MG PO CAPS
100.0000 mg | ORAL_CAPSULE | Freq: Two times a day (BID) | ORAL | Status: DC
  Administered 2021-12-23: 100 mg via ORAL
  Filled 2021-12-23: qty 1

## 2021-12-23 MED ORDER — METOPROLOL TARTRATE 12.5 MG HALF TABLET: 12.5000 mg | ORAL_TABLET | Freq: Two times a day (BID) | ORAL | Status: DC

## 2021-12-23 MED ORDER — METHYLPREDNISOLONE ACETATE 80 MG/ML IJ SUSP
INTRAMUSCULAR | Status: DC | PRN
  Administered 2021-12-23: 80 mg

## 2021-12-23 MED ORDER — LIDOCAINE 2% (20 MG/ML) 5 ML SYRINGE
INTRAMUSCULAR | Status: AC
  Filled 2021-12-23: qty 5

## 2021-12-23 MED ORDER — SUGAMMADEX SODIUM 200 MG/2ML IV SOLN
INTRAVENOUS | Status: DC | PRN
  Administered 2021-12-23: 200 mg via INTRAVENOUS

## 2021-12-23 MED ORDER — CHLORHEXIDINE GLUCONATE CLOTH 2 % EX PADS: 6.0000 | MEDICATED_PAD | Freq: Once | CUTANEOUS | Status: DC

## 2021-12-23 MED ORDER — CEFAZOLIN SODIUM-DEXTROSE 2-4 GM/100ML-% IV SOLN
2.0000 g | Freq: Three times a day (TID) | INTRAVENOUS | Status: DC
  Administered 2021-12-23: 2 g via INTRAVENOUS
  Filled 2021-12-23: qty 100

## 2021-12-23 MED ORDER — INSULIN ASPART 100 UNIT/ML IJ SOLN: 0.0000 [IU] | INTRAMUSCULAR | Status: DC | PRN

## 2021-12-23 MED ORDER — THROMBIN 5000 UNITS EX SOLR: OROMUCOSAL | Status: DC | PRN

## 2021-12-23 MED ORDER — BUPIVACAINE HCL (PF) 0.5 % IJ SOLN
INTRAMUSCULAR | Status: AC
  Filled 2021-12-23: qty 30

## 2021-12-23 MED ORDER — HYDROCODONE-ACETAMINOPHEN 5-325 MG PO TABS
1.0000 | ORAL_TABLET | ORAL | Status: DC | PRN
  Administered 2021-12-23: 1 via ORAL
  Filled 2021-12-23: qty 1

## 2021-12-23 MED ORDER — METHOCARBAMOL 500 MG PO TABS
500.0000 mg | ORAL_TABLET | Freq: Four times a day (QID) | ORAL | Status: DC | PRN
  Administered 2021-12-23: 500 mg via ORAL
  Filled 2021-12-23: qty 1

## 2021-12-23 MED ORDER — MOMETASONE FURO-FORMOTEROL FUM 200-5 MCG/ACT IN AERO
2.0000 | INHALATION_SPRAY | Freq: Two times a day (BID) | RESPIRATORY_TRACT | Status: DC
  Filled 2021-12-23: qty 8.8

## 2021-12-23 MED ORDER — PROPOFOL 10 MG/ML IV BOLUS
INTRAVENOUS | Status: DC | PRN
  Administered 2021-12-23: 10 mg via INTRAVENOUS
  Administered 2021-12-23: 100 mg via INTRAVENOUS
  Administered 2021-12-23 (×2): 20 mg via INTRAVENOUS

## 2021-12-23 MED ORDER — FENTANYL CITRATE (PF) 250 MCG/5ML IJ SOLN
INTRAMUSCULAR | Status: AC
  Filled 2021-12-23: qty 5

## 2021-12-23 MED ORDER — ALBUTEROL SULFATE HFA 108 (90 BASE) MCG/ACT IN AERS
INHALATION_SPRAY | RESPIRATORY_TRACT | Status: AC
  Filled 2021-12-23: qty 6.7

## 2021-12-23 MED ORDER — ONDANSETRON HCL 4 MG/2ML IJ SOLN
INTRAMUSCULAR | Status: DC | PRN
  Administered 2021-12-23: 4 mg via INTRAVENOUS

## 2021-12-23 MED ORDER — FUROSEMIDE 20 MG PO TABS
20.0000 mg | ORAL_TABLET | Freq: Every day | ORAL | Status: DC
  Administered 2021-12-23: 20 mg via ORAL
  Filled 2021-12-23: qty 1

## 2021-12-23 MED ORDER — PRAVASTATIN SODIUM 10 MG PO TABS: 20.0000 mg | ORAL_TABLET | Freq: Every day | ORAL | Status: DC

## 2021-12-23 MED ORDER — INSULIN ASPART 100 UNIT/ML IJ SOLN: 6.0000 [IU] | Freq: Three times a day (TID) | INTRAMUSCULAR | Status: DC

## 2021-12-23 MED ORDER — LIDOCAINE-EPINEPHRINE 1 %-1:100000 IJ SOLN
INTRAMUSCULAR | Status: AC
  Filled 2021-12-23: qty 1

## 2021-12-23 MED ORDER — SODIUM CHLORIDE 0.9% FLUSH: 3.0000 mL | INTRAVENOUS | Status: DC | PRN

## 2021-12-23 MED ORDER — FENTANYL CITRATE (PF) 100 MCG/2ML IJ SOLN
25.0000 ug | INTRAMUSCULAR | Status: DC | PRN
  Administered 2021-12-23 (×2): 50 ug via INTRAVENOUS

## 2021-12-23 MED ORDER — ORAL CARE MOUTH RINSE: 15.0000 mL | Freq: Once | OROMUCOSAL | Status: AC

## 2021-12-23 MED ORDER — CEFAZOLIN SODIUM-DEXTROSE 2-4 GM/100ML-% IV SOLN
INTRAVENOUS | Status: AC
  Filled 2021-12-23: qty 100

## 2021-12-23 MED ORDER — PHENYLEPHRINE HCL-NACL 20-0.9 MG/250ML-% IV SOLN
INTRAVENOUS | Status: DC | PRN
  Administered 2021-12-23: 40 ug/min via INTRAVENOUS

## 2021-12-23 MED ORDER — ROCURONIUM BROMIDE 10 MG/ML (PF) SYRINGE
PREFILLED_SYRINGE | INTRAVENOUS | Status: DC | PRN
  Administered 2021-12-23: 10 mg via INTRAVENOUS
  Administered 2021-12-23: 60 mg via INTRAVENOUS

## 2021-12-23 MED ORDER — FENTANYL CITRATE (PF) 250 MCG/5ML IJ SOLN
INTRAMUSCULAR | Status: DC | PRN
  Administered 2021-12-23 (×3): 50 ug via INTRAVENOUS

## 2021-12-23 MED ORDER — PHENYLEPHRINE 80 MCG/ML (10ML) SYRINGE FOR IV PUSH (FOR BLOOD PRESSURE SUPPORT)
PREFILLED_SYRINGE | INTRAVENOUS | Status: AC
  Filled 2021-12-23: qty 10

## 2021-12-23 MED ORDER — SODIUM CHLORIDE 0.9% FLUSH
3.0000 mL | Freq: Two times a day (BID) | INTRAVENOUS | Status: DC
  Administered 2021-12-23: 3 mL via INTRAVENOUS

## 2021-12-23 MED ORDER — METHYLPREDNISOLONE ACETATE 80 MG/ML IJ SUSP
INTRAMUSCULAR | Status: AC
  Filled 2021-12-23: qty 1

## 2021-12-23 MED ORDER — ONDANSETRON HCL 4 MG/2ML IJ SOLN: 4.0000 mg | Freq: Four times a day (QID) | INTRAMUSCULAR | Status: DC | PRN

## 2021-12-23 MED ORDER — METHOCARBAMOL 1000 MG/10ML IJ SOLN: 500.0000 mg | Freq: Four times a day (QID) | INTRAVENOUS | Status: DC | PRN

## 2021-12-23 MED ORDER — BISACODYL 10 MG RE SUPP: 10.0000 mg | Freq: Every day | RECTAL | Status: DC | PRN

## 2021-12-23 SURGICAL SUPPLY — 50 items
BAG COUNTER SPONGE SURGICOUNT (BAG) ×1 IMPLANT
BAND RUBBER #18 3X1/16 STRL (MISCELLANEOUS) ×2 IMPLANT
BENZOIN TINCTURE PRP APPL 2/3 (GAUZE/BANDAGES/DRESSINGS) IMPLANT
BLADE CLIPPER SURG (BLADE) IMPLANT
BLADE SURG 11 STRL SS (BLADE) ×1 IMPLANT
BUR MATCHSTICK NEURO 3.0 LAGG (BURR) ×1 IMPLANT
BUR PRECISION FLUTE 5.0 (BURR) IMPLANT
CANISTER SUCT 3000ML PPV (MISCELLANEOUS) ×1 IMPLANT
DERMABOND ADVANCED .7 DNX12 (GAUZE/BANDAGES/DRESSINGS) ×1 IMPLANT
DRAPE LAPAROTOMY 100X72X124 (DRAPES) ×1 IMPLANT
DRAPE MICROSCOPE SLANT 54X150 (MISCELLANEOUS) ×1 IMPLANT
DRAPE SURG 17X23 STRL (DRAPES) ×1 IMPLANT
DRSG OPSITE POSTOP 3X4 (GAUZE/BANDAGES/DRESSINGS) ×1 IMPLANT
DURAPREP 26ML APPLICATOR (WOUND CARE) ×1 IMPLANT
ELECT REM PT RETURN 9FT ADLT (ELECTROSURGICAL) ×1
ELECTRODE REM PT RTRN 9FT ADLT (ELECTROSURGICAL) ×1 IMPLANT
GAUZE 4X4 16PLY ~~LOC~~+RFID DBL (SPONGE) IMPLANT
GAUZE SPONGE 4X4 12PLY STRL (GAUZE/BANDAGES/DRESSINGS) IMPLANT
GLOVE BIO SURGEON STRL SZ7.5 (GLOVE) IMPLANT
GLOVE BIOGEL PI IND STRL 7.5 (GLOVE) ×2 IMPLANT
GLOVE ECLIPSE 7.0 STRL STRAW (GLOVE) ×1 IMPLANT
GLOVE EXAM NITRILE XL STR (GLOVE) IMPLANT
GOWN STRL REUS W/ TWL LRG LVL3 (GOWN DISPOSABLE) ×2 IMPLANT
GOWN STRL REUS W/ TWL XL LVL3 (GOWN DISPOSABLE) IMPLANT
GOWN STRL REUS W/TWL 2XL LVL3 (GOWN DISPOSABLE) IMPLANT
GOWN STRL REUS W/TWL LRG LVL3 (GOWN DISPOSABLE) ×2
GOWN STRL REUS W/TWL XL LVL3 (GOWN DISPOSABLE)
HEMOSTAT POWDER KIT SURGIFOAM (HEMOSTASIS) ×1 IMPLANT
KIT BASIN OR (CUSTOM PROCEDURE TRAY) ×1 IMPLANT
KIT TURNOVER KIT B (KITS) ×1 IMPLANT
NDL HYPO 18GX1.5 BLUNT FILL (NEEDLE) IMPLANT
NDL SPNL 18GX3.5 QUINCKE PK (NEEDLE) IMPLANT
NEEDLE HYPO 18GX1.5 BLUNT FILL (NEEDLE) IMPLANT
NEEDLE HYPO 22GX1.5 SAFETY (NEEDLE) ×1 IMPLANT
NEEDLE SPNL 18GX3.5 QUINCKE PK (NEEDLE) IMPLANT
NS IRRIG 1000ML POUR BTL (IV SOLUTION) ×1 IMPLANT
PACK LAMINECTOMY NEURO (CUSTOM PROCEDURE TRAY) ×1 IMPLANT
PAD ARMBOARD 7.5X6 YLW CONV (MISCELLANEOUS) ×3 IMPLANT
SPIKE FLUID TRANSFER (MISCELLANEOUS) ×1 IMPLANT
SPONGE SURGIFOAM ABS GEL SZ50 (HEMOSTASIS) ×1 IMPLANT
SPONGE T-LAP 4X18 ~~LOC~~+RFID (SPONGE) IMPLANT
STRIP CLOSURE SKIN 1/2X4 (GAUZE/BANDAGES/DRESSINGS) IMPLANT
SUT VIC AB 0 CT1 18XCR BRD8 (SUTURE) ×1 IMPLANT
SUT VIC AB 0 CT1 8-18 (SUTURE) ×1
SUT VIC AB 2-0 CT1 18 (SUTURE) IMPLANT
SUT VICRYL 3-0 RB1 18 ABS (SUTURE) ×2 IMPLANT
SYR 3ML LL SCALE MARK (SYRINGE) IMPLANT
TOWEL GREEN STERILE (TOWEL DISPOSABLE) ×1 IMPLANT
TOWEL GREEN STERILE FF (TOWEL DISPOSABLE) ×1 IMPLANT
WATER STERILE IRR 1000ML POUR (IV SOLUTION) ×1 IMPLANT

## 2021-12-23 NOTE — Anesthesia Procedure Notes (Signed)
Procedure Name: Intubation Date/Time: 12/23/2021 8:11 AM  Performed by: Erick Colace, CRNAPre-anesthesia Checklist: Patient identified, Emergency Drugs available, Suction available and Patient being monitored Patient Re-evaluated:Patient Re-evaluated prior to induction Oxygen Delivery Method: Circle system utilized Preoxygenation: Pre-oxygenation with 100% oxygen Induction Type: IV induction Ventilation: Mask ventilation without difficulty and Oral airway inserted - appropriate to patient size Laryngoscope Size: Mac and 4 Grade View: Grade I Tube type: Oral Tube size: 7.0 mm Number of attempts: 1 Airway Equipment and Method: Stylet and Oral airway Placement Confirmation: ETT inserted through vocal cords under direct vision, positive ETCO2 and breath sounds checked- equal and bilateral Secured at: 22 cm Tube secured with: Tape Dental Injury: Teeth and Oropharynx as per pre-operative assessment

## 2021-12-23 NOTE — Anesthesia Postprocedure Evaluation (Signed)
Anesthesia Post Note  Patient: Joyce Gilmore  Procedure(s) Performed: L 4-5 LAMINOTOMY,FORAMINOTOMY (Left: Back)     Patient location during evaluation: PACU Anesthesia Type: General Level of consciousness: awake Pain management: pain level controlled Vital Signs Assessment: post-procedure vital signs reviewed and stable Respiratory status: spontaneous breathing Cardiovascular status: stable Postop Assessment: no apparent nausea or vomiting Anesthetic complications: no   No notable events documented.  Last Vitals:  Vitals:   12/23/21 1100 12/23/21 1140  BP: (!) 152/60 (!) 156/72  Pulse: (!) 58 (!) 58  Resp: 12 20  Temp: (!) 36.1 C 36.6 C  SpO2: 95% 100%    Last Pain:  Vitals:   12/23/21 1140  TempSrc: Oral  PainSc:                  Travin Marik

## 2021-12-23 NOTE — Evaluation (Addendum)
Occupational Therapy Evaluation Patient Details Name: Joyce Gilmore MRN: [id number] DOB: 07/22/1934 Today's Date: 12/23/2021   History of Present Illness Pt is an 87 y/o F s/p left L4-5 laminectomy/foraminectomy. PMH includes arthritis, CHF, CKD, depression, DM, gout, hearing loss, and HTN.   Clinical Impression   Pt needs assist at baseline for ADLs, uses RW for mobility, lives with family who can provide 24/7 assist at d/c. Pt currently needing min-max A for ADLs, min A for bed mobility, and min A for transfers with RW. Pt and family educated on back precautions (Spanish handout provided), bed mobility, and compensatory strategies for ADLs, pt and family verbalized understanding but would benefit from reinforcement as pt only able to recall 1/3 precautions after session. Pt presenting with impairments listed below, will follow acutely. Recommend HHOT at d/c.     Recommendations for follow up therapy are one component of a multi-disciplinary discharge planning process, led by the attending physician.  Recommendations may be updated based on patient status, additional functional criteria and insurance authorization.   Follow Up Recommendations  Home health OT    Assistance Recommended at Discharge Frequent or constant Supervision/Assistance  Patient can return home with the following A little help with walking and/or transfers;A little help with bathing/dressing/bathroom;Direct supervision/assist for medications management;Direct supervision/assist for financial management;Assistance with cooking/housework;Help with stairs or ramp for entrance;Assist for transportation    Functional Status Assessment  Patient has had a recent decline in their functional status and demonstrates the ability to make significant improvements in function in a reasonable and predictable amount of time.  Equipment Recommendations  None recommended by OT (pt has all needed DME)    Recommendations for Other  Services PT consult     Precautions / Restrictions Precautions Precautions: Fall;Back Precaution Booklet Issued: Yes (comment) (spanish) Precaution Comments: educated pt on 3/3 back precautions Required Braces or Orthoses: Other Brace Other Brace: no brace needed per MD Restrictions Weight Bearing Restrictions: No      Mobility Bed Mobility Overal bed mobility: Needs Assistance Bed Mobility: Sidelying to Sit, Sit to Sidelying   Sidelying to sit: Min assist     Sit to sidelying: Min assist General bed mobility comments: cues for sequencing, assist for BLE    Transfers Overall transfer level: Needs assistance Equipment used: Rolling walker (2 wheels) Transfers: Sit to/from Stand Sit to Stand: Min assist                  Balance Overall balance assessment: Needs assistance Sitting-balance support: Feet supported Sitting balance-Leahy Scale: Fair Sitting balance - Comments: unable to reach outside BOS without LOB   Standing balance support: During functional activity, Reliant on assistive device for balance Standing balance-Leahy Scale: Poor Standing balance comment: reliant on external support                           ADL either performed or assessed with clinical judgement   ADL Overall ADL's : Needs assistance/impaired Eating/Feeding: Supervision/ safety   Grooming: Min guard   Upper Body Bathing: Moderate assistance   Lower Body Bathing: Maximal assistance   Upper Body Dressing : Moderate assistance   Lower Body Dressing: Maximal assistance   Toilet Transfer: Minimal assistance;Rolling walker (2 wheels)   Toileting- Clothing Manipulation and Hygiene: Minimal assistance       Functional mobility during ADLs: Minimal assistance;Rolling walker (2 wheels)       Vision   Vision Assessment?: No apparent visual deficits  Perception     Praxis      Pertinent Vitals/Pain Pain Assessment Pain Assessment: Faces Pain Score: 7   Faces Pain Scale: Hurts even more Pain Location: back Pain Descriptors / Indicators: Discomfort Pain Intervention(s): Limited activity within patient's tolerance, Monitored during session, Repositioned     Hand Dominance Right   Extremity/Trunk Assessment Upper Extremity Assessment Upper Extremity Assessment: Generalized weakness   Lower Extremity Assessment Lower Extremity Assessment: Defer to PT evaluation   Cervical / Trunk Assessment Cervical / Trunk Assessment: Back Surgery   Communication Communication Communication: HOH;Prefers language other than English;Interpreter utilized Diplomatic Services operational officer interpreter Quillian Quince [phone])   Cognition Arousal/Alertness: Awake/alert Behavior During Therapy: Restless Overall Cognitive Status: Difficult to assess                                 General Comments: daughter needing to provide clarification of instructions despite use of interpreter, pt only able to recall 1/3 back prec     General Comments  VSS on RA, family present    Exercises     Shoulder Instructions      Home Living Family/patient expects to be discharged to:: Private residence Living Arrangements: Children;Other relatives Available Help at Discharge: Family;Available 24 hours/day Type of Home: House Home Access: Stairs to enter [employer] of Steps: 4   Home Layout: One level     Bathroom Shower/Tub: Occupational psychologist: Standard     Home Equipment: Cane - single Barista (2 wheels);Shower seat;BSC/3in1          Prior Functioning/Environment Prior Level of Function : Needs assist             Mobility Comments: use of RW inside and outside home ADLs Comments: needs assist for ADLs        OT Problem List: Decreased strength;Decreased range of motion;Decreased activity tolerance;Impaired balance (sitting and/or standing);Decreased safety awareness;Decreased cognition      OT Treatment/Interventions:  Self-care/ADL training;Therapeutic exercise;Energy conservation;DME and/or AE instruction;Therapeutic activities;Patient/family education;Balance training    OT Goals(Current goals can be found in the care plan section) Acute Rehab OT Goals Patient Stated Goal: none stated OT Goal Formulation: With patient Time For Goal Achievement: 01/06/22 Potential to Achieve Goals: Good ADL Goals Pt Will Perform Upper Body Dressing: with supervision;sitting Pt Will Perform Lower Body Dressing: with min assist;sit to/from stand;sitting/lateral leans Pt Will Transfer to Toilet: with min guard assist;ambulating;regular height toilet Pt Will Perform Tub/Shower Transfer: Shower transfer;with supervision;ambulating;shower seat Additional ADL Goal #1: pt will verbalize and adhere to 3/3 back precautions during ADLs Additional ADL Goal #2: pt will demo log roll technique with mod I  OT Frequency: Min 2X/week    Co-evaluation              AM-PAC OT "6 Clicks" Daily Activity     Outcome Measure Help from another person eating meals?: None Help from another person taking care of personal grooming?: A Little Help from another person toileting, which includes using toliet, bedpan, or urinal?: A Little Help from another person bathing (including washing, rinsing, drying)?: A Lot Help from another person to put on and taking off regular upper body clothing?: A Little Help from another person to put on and taking off regular lower body clothing?: A Lot 6 Click Score: 17   End of Session Equipment Utilized During Treatment: Rolling walker (2 wheels) Nurse Communication: Mobility status  Activity Tolerance: Patient tolerated treatment  well Patient left: in bed;with call bell/phone within reach;with family/visitor present  OT Visit Diagnosis: Unsteadiness on feet (R26.81);Other abnormalities of gait and mobility (R26.89);Muscle weakness (generalized) (M62.81)                Time: 5625-6389 OT Time  Calculation (min): 28 min Charges:  OT General Charges $OT Visit: 1 Visit OT Evaluation $OT Eval Low Complexity: 1 Low OT Treatments $Self Care/Home Management : 8-22 mins  Lynnda Child, OTD, OTR/L Acute Rehab [phone] - [place] 12/23/2021, 5:28 PM

## 2021-12-23 NOTE — Progress Notes (Signed)
Patient alert and oriented, mae's well, voiding adequate amount of urine, swallowing without difficulty, no c/o pain at time of discharge. Patient discharged home with family. Script and discharged instructions given to patient. Patient and family stated understanding of instructions given. Patient has an appointment with Dr. Nundkumar  

## 2021-12-23 NOTE — Plan of Care (Signed)

## 2021-12-23 NOTE — Transfer of Care (Signed)
Immediate Anesthesia Transfer of Care Note  Patient: Joyce Gilmore  Procedure(s) Performed: L 4-5 LAMINOTOMY,FORAMINOTOMY (Left: Back)  Patient Location: PACU  Anesthesia Type:General  Level of Consciousness: awake and drowsy  Airway & Oxygen Therapy: Patient Spontanous Breathing  Post-op Assessment: Report given to RN and Post -op Vital signs reviewed and stable  Post vital signs: Reviewed and stable  Last Vitals:  Vitals Value Taken Time  BP 130/51 12/23/21 0952  Temp    Pulse 67 12/23/21 0953  Resp 20 12/23/21 0953  SpO2 96 % 12/23/21 0953  Vitals shown include unvalidated device data.  Last Pain:  Vitals:   12/23/21 0623  TempSrc:   PainSc: 10-Worst pain ever      Patients Stated Pain Goal: 3 (34/75/83 0746)  Complications: No notable events documented.

## 2021-12-23 NOTE — Evaluation (Signed)
Physical Therapy Evaluation Patient Details Name: Joyce Gilmore MRN: [id number] DOB: [birth year] Today's Date: 12/23/2021  History of Present Illness  Pt is an 87 y/o F s/p left L4-5 laminectomy/foraminectomy. PMH includes arthritis, CHF, CKD, depression, DM, gout, hearing loss, and HTN.  Clinical Impression  Pt able to mobilize with assistance post op and has family to assist at home. Pt with some baseline mobility limitations. Could benefit from HHPT if available for her payor source.        Recommendations for follow up therapy are one component of a multi-disciplinary discharge planning process, led by the attending physician.  Recommendations may be updated based on patient status, additional functional criteria and insurance authorization.  Follow Up Recommendations Home health PT (if available with payor source)      Assistance Recommended at Discharge Frequent or constant Supervision/Assistance  Patient can return home with the following  A little help with walking and/or transfers;A little help with bathing/dressing/bathroom;Assist for transportation;Help with stairs or ramp for entrance    Equipment Recommendations None recommended by PT  Recommendations for Other Services       Functional Status Assessment Patient has had a recent decline in their functional status and demonstrates the ability to make significant improvements in function in a reasonable and predictable amount of time.     Precautions / Restrictions Precautions Precautions: Fall;Back Precaution Booklet Issued: Yes (comment) (spanish) Precaution Comments: educated pt on 3/3 back precautions Required Braces or Orthoses: Other Brace Other Brace: no brace needed per MD Restrictions Weight Bearing Restrictions: No      Mobility  Bed Mobility Overal bed mobility: Needs Assistance Bed Mobility: Sidelying to Sit, Sit to Sidelying   Sidelying to sit: Min assist     Sit to sidelying: Min  assist General bed mobility comments: Verbal cues for technique. Assist to elevate trunk. Assist to bring LE's back up into bed returning to sidelying    Transfers Overall transfer level: Needs assistance Equipment used: Rolling walker (2 wheels) Transfers: Sit to/from Stand Sit to Stand: Min guard           General transfer comment: Assist for safety. Verbal/tactile cues for hand placement    Ambulation/Gait Ambulation/Gait assistance: Min guard Gait Distance (Feet): 100 Feet Assistive device: Rolling walker (2 wheels) Gait Pattern/deviations: Step-through pattern, Decreased step length - right, Decreased step length - left, Trunk flexed Gait velocity: decr Gait velocity interpretation: 1.31 - 2.62 ft/sec, indicative of limited community ambulator   General Gait Details: Assist for safety  Stairs Stairs: Yes Stairs assistance: +2 physical assistance, Min assist Stair Management: No rails, Step to pattern, Forwards Number of Stairs: 1 (x 2) General stair comments: Used portable step to practice. Up/down 1 step with bilateral hand held of myself and pt's dtr. Performed 1 step x 2.  Wheelchair Mobility    Modified Rankin (Stroke Patients Only)       Balance Overall balance assessment: Needs assistance Sitting-balance support: Feet supported Sitting balance-Leahy Scale: Fair     Standing balance support: During functional activity, Reliant on assistive device for balance, Bilateral upper extremity supported Standing balance-Leahy Scale: Poor Standing balance comment: walker and min guard for static standing                             Pertinent Vitals/Pain Pain Assessment Pain Assessment: Faces Faces Pain Scale: Hurts little more Pain Location: lt knee Pain Descriptors / Indicators: Grimacing Pain Intervention(s): Limited activity  within patient's tolerance, Monitored during session    Home Living Family/patient expects to be discharged to::  Private residence Living Arrangements: Children;Other relatives Available Help at Discharge: Family;Available 24 hours/day Type of Home: House Home Access: Stairs to enter Entrance Stairs-Rails: None Entrance Stairs-Number of Steps: 4   Home Layout: One level Home Equipment: Cane - single Barista (2 wheels);Shower seat;BSC/3in1      Prior Function Prior Level of Function : Needs assist             Mobility Comments: use of RW inside and outside home ADLs Comments: needs assist for ADLs     Hand Dominance   Dominant Hand: Right    Extremity/Trunk Assessment   Upper Extremity Assessment Upper Extremity Assessment: Defer to OT evaluation    Lower Extremity Assessment Lower Extremity Assessment: Generalized weakness    Cervical / Trunk Assessment Cervical / Trunk Assessment: Back Surgery  Communication   Communication: HOH;Prefers language other than English;Interpreter utilized  Cognition Arousal/Alertness: Awake/alert Behavior During Therapy: WFL for tasks assessed/performed Overall Cognitive Status: Difficult to assess                                 General Comments: Pt hard of hearing so using interpreter and daughter relaying what interpreter said in louder voice.        General Comments General comments (skin integrity, edema, etc.): Video interpreter [place] used throughout treatment.    Exercises     Assessment/Plan    PT Assessment All further PT needs can be met in the next venue of care  PT Problem List Decreased strength;Decreased balance;Decreased mobility       PT Treatment Interventions      PT Goals (Current goals can be found in the Care Plan section)  Acute Rehab PT Goals PT Goal Formulation: All assessment and education complete, DC therapy    Frequency       Co-evaluation               AM-PAC PT "6 Clicks" Mobility  Outcome Measure Help needed turning from your back to your side  while in a flat bed without using bedrails?: A Little Help needed moving from lying on your back to sitting on the side of a flat bed without using bedrails?: A Little Help needed moving to and from a bed to a chair (including a wheelchair)?: A Little Help needed standing up from a chair using your arms (e.g., wheelchair or bedside chair)?: A Little Help needed to walk in hospital room?: A Little Help needed climbing 3-5 steps with a railing? : A Lot 6 Click Score: 17    End of Session Equipment Utilized During Treatment: Gait belt Activity Tolerance: Patient tolerated treatment well Patient left: in bed;with call bell/phone within reach;with family/visitor present Nurse Communication: Mobility status PT Visit Diagnosis: Other abnormalities of gait and mobility (R26.89)    Time: 0321-2248 PT Time Calculation (min) (ACUTE ONLY): 14 min   Charges:   PT Evaluation $PT Eval Low Complexity: 1 Low          [place] [facility] [place] 12/23/2021, 6:28 PM

## 2021-12-23 NOTE — Discharge Summary (Signed)
Physician Discharge Summary  Patient ID: Joyce Gilmore MRN: [id number] DOB/AGE: 05/04/1934 87 y.o.  Admit date: 12/23/2021 Discharge date: 12/23/2021  Admission Diagnoses:  Lumbar spondylosis with radiculopathy, L4-5  Discharge Diagnoses:  Same Principal Problem:   Other spondylosis with radiculopathy, lumbar region   Discharged Condition: Stable  Hospital Course:  Joyce Gilmore is a 87 y.o. female admitted after uncomplicated left G1-8 laminotomy/foraminotomy. She was at neurologic baseline postop, voiding normally, ambulating with a rolling walker and requested discharge home.  Treatments: Surgery - Left L4-5 laminotomy/foraminotomy  Discharge Exam: Blood pressure (!) 148/63, pulse 72, temperature 98.2 F (36.8 C), temperature source Oral, resp. rate 16, height '4\' 10"'$  (1.473 m), weight 71.7 kg, SpO2 96 %. Awake, alert, oriented Speech fluent, appropriate CN grossly intact 5/5 BUE/BLE Wound c/d/i  Disposition: Discharge disposition: 01-Home or Self Care       Discharge Instructions     Call MD for:  redness, tenderness, or signs of infection (pain, swelling, redness, odor or green/yellow discharge around incision site)   Complete by: As directed    Call MD for:  temperature >100.4   Complete by: As directed    Diet - low sodium heart healthy   Complete by: As directed    Discharge instructions   Complete by: As directed    Walk at home as much as possible, at least 4 times / day   Incentive spirometry RT   Complete by: As directed    Increase activity slowly   Complete by: As directed    Lifting restrictions   Complete by: As directed    No lifting > 10 lbs   May shower / Bathe   Complete by: As directed    48 hours after surgery   May walk up steps   Complete by: As directed    Other Restrictions   Complete by: As directed    No bending/twisting at waist   Remove dressing in 48 hours   Complete by: As directed       Allergies  as of 12/23/2021       Reactions   Bactrim [sulfamethoxazole-trimethoprim] Other (See Comments)   Hyperkalemia (elevated potassium)   Rifampin Other (See Comments)   Severe thrombocytopenia (low blood platelet count)   Vancomycin Other (See Comments)   Severe thrombocytopenia (low blood platelet count)        Medication List     TAKE these medications    acetaminophen 325 MG tablet Commonly known as: TYLENOL Take 650 mg by mouth every 6 (six) hours as needed for moderate pain.   albuterol 108 (90 Base) MCG/ACT inhaler Commonly known as: VENTOLIN HFA Inhale 2 puffs into the lungs every 6 (six) hours as needed for wheezing or shortness of breath.   amLODipine 5 MG tablet Commonly known as: NORVASC TAKE 1 TABLET(5 MG) BY MOUTH DAILY   budesonide-formoterol 160-4.5 MCG/ACT inhaler Commonly known as: SYMBICORT Inhale 2 puffs into the lungs 2 (two) times daily. What changed:  when to take this reasons to take this   furosemide 20 MG tablet Commonly known as: LASIX Take 1 tablet (20 mg total) by mouth daily.   glucose blood test strip Please dispense based on patient and insurance preference.  Use as directed to monitor FSBS four times daily.  Dx E11.22   Hair/Skin/Nails Tabs Take 1 tablet by mouth daily.   HYDROcodone-acetaminophen 5-325 MG tablet Commonly known as: NORCO/VICODIN Take 1 tablet by mouth every 6 (six) hours as needed  for up to 7 days for severe pain. What changed:  when to take this reasons to take this   Lancets Misc Please dispense based on patient and insurance preference.  Use as directed to monitor FSBS four times daily.  Dx E11.22   lansoprazole 30 MG capsule Commonly known as: PREVACID TAKE ONE CAPSULE BY MOUTH DAILY   Lantus SoloStar 100 UNIT/ML Solostar Pen Generic drug: insulin glargine INJECT  38 UNITS UNDER THE SKIN AT BEDTIME   lidocaine 4 % Place 1 patch onto the skin every 12 (twelve) hours.   lisinopril 10 MG  tablet Commonly known as: ZESTRIL Take 1 tablet (10 mg total) by mouth daily.   metoprolol tartrate 25 MG tablet Commonly known as: LOPRESSOR TAKE 1/2 TABLET(12.5 MG) BY MOUTH TWICE DAILY   naproxen 500 MG tablet Commonly known as: NAPROSYN Take 1 tablet (500 mg total) by mouth 2 (two) times daily.   NovoLOG FlexPen 100 UNIT/ML FlexPen Generic drug: insulin aspart ADMINISTER 5 TO 15 UNITS UNDER THE SKIN THREE TIMES DAILY WITH MEALS PER SLIDING SCALE What changed:  how much to take how to take this when to take this additional instructions   Pen Needles 32G X 4 MM Misc 1 Device by Does not apply route 4 (four) times daily. E11.22   pravastatin 20 MG tablet Commonly known as: PRAVACHOL TAKE 1 TABLET(20 MG) BY MOUTH DAILY   sodium chloride 1 g tablet Take 1 tablet (1 g total) by mouth 3 (three) times daily.   sucralfate 1 g tablet Commonly known as: CARAFATE TAKE 1 TABLET(1 GRAM) BY MOUTH FOUR TIMES DAILY        Follow-up Information     Consuella Lose, MD. Call in 2 week(s).   Specialty: Neurosurgery Contact information: [address] [place] [address] [phone]                 Signed: Jairo Ben 12/23/2021, 4:35 PM

## 2021-12-23 NOTE — H&P (Signed)
Chief Complaint   Left leg pain  History of Present Illness  Joyce Gilmore is a 87 y.o. female seen in the outpatient neurosurgery clinic complaining of severe left-sided back and leg pain.  Patient reports onset of pain several months ago without any identifiable inciting event.  She has undergone conservative treatment including an epidural steroid injection which did not provide any meaningful improvement.  Her imaging did reveal stenosis on the left at L4-5.  She therefore elected to proceed with surgical decompression.  Past Medical History   Past Medical History:  Diagnosis Date   Arthritis    Asthma    Per new patient form   CHF (congestive heart failure) ([place])    diastolic   Chronic kidney disease    hx of kidney stones,    Depression    hx of    Diabetes mellitus without complication (HCC)    Ganglion, left ankle and foot    Gout    Per new patient form   Hearing loss    Per new patient form   Hearing loss of both ears    Hypertension    MRSA infection    Oct 13 - Nov 13   Osteoporosis    Tinnitus of both ears     Past Surgical History   Past Surgical History:  Procedure Laterality Date   INCISION AND DRAINAGE HIP  12/22/2011   Procedure: IRRIGATION AND DEBRIDEMENT HIP;  Surgeon: Mcarthur Rossetti, MD;  Location: [facility];  Service: Orthopedics;  Laterality: Right;  Irrigation and debridement right hip   INCISION AND DRAINAGE HIP  12/27/2011   Procedure: IRRIGATION AND DEBRIDEMENT HIP;  Surgeon: Mcarthur Rossetti, MD;  Location: [facility];  Service: Orthopedics;  Laterality: Right;  Repeat irrigation and debridement   JOINT REPLACEMENT     bilateral knee, right hip    MASS EXCISION N/A 01/03/2020   Procedure: EXCISION OF HEMANGIOMA OF THE PALATE;  Surgeon: Leta Baptist, MD;  Location: [facility];  Service: ENT;  Laterality: N/A;   TOTAL HIP REVISION  12/05/2011   Procedure: TOTAL HIP REVISION;  Surgeon: Mcarthur Rossetti, MD;   Location: [facility];  Service: Orthopedics;  Laterality: Right;  Right Hip Revision Arthroplasty to Total Hip, Excision of Old Implant    Social History   Social History   Tobacco Use   Smoking status: Never   Smokeless tobacco: Never  Vaping Use   Vaping Use: Never used  Substance Use Topics   Alcohol use: No    Alcohol/week: 0.0 standard drinks of alcohol   Drug use: No    Medications   Prior to Admission medications   Medication Sig Start Date End Date Taking? Authorizing Provider  acetaminophen (TYLENOL) 325 MG tablet Take 650 mg by mouth every 6 (six) hours as needed for moderate pain.   Yes [provider]  albuterol (VENTOLIN HFA) 108 (90 Base) MCG/ACT inhaler Inhale 2 puffs into the lungs every 6 (six) hours as needed for wheezing or shortness of breath. 12/17/21  Yes Fargo, Amy E, NP  amLODipine (NORVASC) 5 MG tablet TAKE 1 TABLET(5 MG) BY MOUTH DAILY 12/05/21  Yes Fargo, Amy E, NP  budesonide-formoterol (SYMBICORT) 160-4.5 MCG/ACT inhaler Inhale 2 puffs into the lungs 2 (two) times daily. Patient taking differently: Inhale 2 puffs into the lungs 2 (two) times daily as needed (shortness of breath). 12/05/21  Yes Fargo, Amy E, NP  furosemide (LASIX) 20 MG tablet Take 1 tablet (20  mg total) by mouth daily. 12/05/21  Yes Fargo, Amy E, NP  HYDROcodone-acetaminophen (NORCO) 5-325 MG tablet Take 1 tablet by mouth 3 (three) times daily as needed for moderate pain. 09/04/21  Yes Mcarthur Rossetti, MD  insulin aspart (NOVOLOG FLEXPEN) 100 UNIT/ML FlexPen ADMINISTER 5 TO 15 UNITS UNDER THE SKIN THREE TIMES DAILY WITH MEALS PER SLIDING SCALE Patient taking differently: Inject 6 Units into the skin 3 (three) times daily with meals. 12/05/21  Yes Fargo, Amy E, NP  insulin glargine (LANTUS SOLOSTAR) 100 UNIT/ML Solostar Pen INJECT  38 UNITS UNDER THE SKIN AT BEDTIME 12/05/21  Yes Fargo, Amy E, NP  lansoprazole (PREVACID) 30 MG capsule TAKE ONE CAPSULE BY MOUTH DAILY 12/05/21   Yes Fargo, Amy E, NP  lisinopril (ZESTRIL) 10 MG tablet Take 1 tablet (10 mg total) by mouth daily. 12/05/21  Yes Fargo, Amy E, NP  metoprolol tartrate (LOPRESSOR) 25 MG tablet TAKE 1/2 TABLET(12.5 MG) BY MOUTH TWICE DAILY 12/05/21  Yes Fargo, Amy E, NP  Multiple Vitamins-Minerals (HAIR/SKIN/NAILS) TABS Take 1 tablet by mouth daily.   Yes [provider]  pravastatin (PRAVACHOL) 20 MG tablet TAKE 1 TABLET(20 MG) BY MOUTH DAILY 12/05/21  Yes Fargo, Amy E, NP  sodium chloride 1 g tablet Take 1 tablet (1 g total) by mouth 3 (three) times daily. 12/13/21  Yes Fargo, Amy E, NP  sucralfate (CARAFATE) 1 g tablet TAKE 1 TABLET(1 GRAM) BY MOUTH FOUR TIMES DAILY 11/07/20  Yes Noemi Chapel A, NP  glucose blood test strip Please dispense based on patient and insurance preference.  Use as directed to monitor FSBS four times daily.  Dx E11.22 01/28/21   Eulogio Bear, NP  Insulin Pen Needle (PEN NEEDLES) 32G X 4 MM MISC 1 Device by Does not apply route 4 (four) times daily. E11.22 11/21/21   Yvonna Alanis, NP  Lancets MISC Please dispense based on patient and insurance preference.  Use as directed to monitor FSBS four times daily.  Dx E11.22 01/28/21   Noemi Chapel A, NP  lidocaine 4 % Place 1 patch onto the skin every 12 (twelve) hours. Patient not taking: Reported on 12/13/2021 08/29/21   Yvonna Alanis, NP  naproxen (NAPROSYN) 500 MG tablet Take 1 tablet (500 mg total) by mouth 2 (two) times daily. Patient not taking: Reported on 12/13/2021 10/01/21   Dorothyann Peng, PA-C    Allergies   Allergies  Allergen Reactions   Bactrim [Sulfamethoxazole-Trimethoprim] Other (See Comments)    Hyperkalemia (elevated potassium)   Rifampin Other (See Comments)    Severe thrombocytopenia (low blood platelet count)   Vancomycin Other (See Comments)    Severe thrombocytopenia (low blood platelet count)    Review of Systems  ROS  Neurologic Exam  Awake, alert, oriented Memory and concentration  grossly intact Speech fluent, appropriate CN grossly intact Motor exam: Upper Extremities Deltoid Bicep Tricep Grip  Right 5/5 5/5 5/5 5/5  Left 5/5 5/5 5/5 5/5   Lower Extremities IP Quad PF DF EHL  Right 5/5 5/5 5/5 5/5 5/5  Left 5/5 5/5 5/5 5/5 5/5   Sensation grossly intact to LT  Imaging  MRI of the lumbar spine dated June 2023 personally reviewed.  This demonstrates subacute to chronic L5 compression fracture.  There is significant bilateral facet arthropathy and ligamentous hypertrophy as well as broad-based disc bulge.  This results in severe left-sided lateral recess and foraminal stenosis at L4-5.  Impression  - 87 y.o. female with severe  left-sided back and leg pain likely related to stenosis at L4-5.  She has failed reasonable conservative treatment.  Plan  -We will plan on proceeding with left-sided L4-5 laminotomy foraminotomies  I have reviewed the indications for the surgical procedure as well as the details of the procedure and the expected postoperative course and recovery with the patient and family in the office through a translator. We have also reviewed in detail the risks, benefits, and alternatives to the procedure. All questions were answered and Freda Munro provided informed consent to proceed.   Consuella Lose, MD [facility]

## 2021-12-24 ENCOUNTER — Encounter (HOSPITAL_COMMUNITY): Payer: Self-pay | Admitting: Neurosurgery

## 2021-12-24 NOTE — Op Note (Signed)
NEUROSURGERY OPERATIVE NOTE   PREOP DIAGNOSIS: Lumbar foraminal stenosis, L4-5  POSTOP DIAGNOSIS: Same  PROCEDURE: 1. Left L4-5 laminotomy with facetectomy and foraminotomy for decompression of thecal sac and exiting nerve root 2. Use of operating microscope  SURGEON: Dr. Consuella Lose, MD  ASSISTANT: Dr. Earnie Larsson, MD  ANESTHESIA: General Endotracheal  EBL: Minimal  SPECIMENS: None  DRAINS: None  COMPLICATIONS: None immediate  CONDITION: Hemodynamically stable to PACU  HISTORY: Joyce Gilmore is a 87 y.o. female Initially seen in the [facility] complaining of relatively severe left-sided leg pain running all the way down to her foot.  Her imaging revealed relatively severe primarily foraminal stenosis on the left at L4-5 likely explaining her symptoms.  She attempted conservative treatment including epidural steroid injection without significant improvement in her symptoms.  Given her age in the presence of an L5 compression fracture, I did not feel that  she would be a candidate instrumented decompression and fusion procedure.  I therefore offered decompression.  The risks, benefits, and alternatives to surgery were all reviewed in detail with the patient and her family through a translator.  After all the questions were answered informed consent was obtained and witnessed.  PROCEDURE IN DETAIL: After informed consent was obtained and witnessed, the patient was brought to the operating room. After induction of general anesthesia, the patient was positioned on the operative table in the prone position with all pressure points meticulously padded. The skin of the low back was then prepped and draped in the usual sterile fashion.  Under fluoroscopy, the correct level was identified and marked out on the skin, and after timeout was conducted, the skin was infiltrated with local anesthetic. Skin incision was then made sharply and Bovie  electrocautery was used to dissect the subcutaneous tissue until the lumbodorsal fascia was identified. The fascia was then incised using Bovie electrocautery and the lamina at the L4 level was identified and dissection was carried out in the subperiosteal plane. Self-retaining retractor was then placed, and intraoperative x-ray was taken to confirm we were at the correct level.  Using a high-speed drill, laminotomy was completed with initially a partial medial facetectomy. The ligamentum flavum was then identified and removed and the lateral edge of the thecal sac was identified. This was then traced down to identify the traversing nerve root.  Dissection was then carried slightly superiorly to identify the L4 pedicle and the proximal portion of the exiting left L4 nerve root.  High-speed drill was then used to drill out more of the lateral lamina and pars.  In doing so, the inferior articulating process completely fractured.  I therefore removed it in order to fully decompress  and unroof the  foramen.  Kerrison punches were then used to remove the medial and superior aspect of the superior articulating process of L5 and fully decompressed the entire foraminal segment of the L4 nerve root.  Once this was done I was able to easily pass a ball dissector both underneath the thecal sac and the entire nerve root was visualized.  Hemostasis was then secured using a combination of morcellized Gelfoam and thrombin and bipolar electrocautery. The wound is irrigated with copious amounts of antibiotic saline irrigation. The nerve root was then covered with a long-acting steroid solution. Self-retaining retractor was then removed, and the wound is closed in layers using a combination of interrupted 0 Vicryl and 3-0 Vicryl stitches. The skin was closed using standard skin glue.  At the end of the case  all sponge, needle, and instrument counts were correct. The patient was then transferred to the stretcher and taken to  the postanesthesia care unit in stable hemodynamic condition.   Consuella Lose, MD [facility]

## 2022-01-02 ENCOUNTER — Ambulatory Visit: Payer: Medicaid Other | Admitting: Orthopedic Surgery

## 2022-01-09 ENCOUNTER — Encounter: Payer: Medicaid Other | Admitting: Physical Medicine & Rehabilitation

## 2022-01-29 ENCOUNTER — Other Ambulatory Visit: Payer: Self-pay | Admitting: Nurse Practitioner

## 2022-01-29 DIAGNOSIS — E1122 Type 2 diabetes mellitus with diabetic chronic kidney disease: Secondary | ICD-10-CM

## 2022-01-29 NOTE — Telephone Encounter (Signed)
Unable to refill per protocol, last refill by another provider. Provider not at this practice, will refuse.  Requested Prescriptions  Pending Prescriptions Disp Refills   ACCU-CHEK GUIDE test strip [Pharmacy Med Name: ACCU-CHEK GUIDE TEST STRIPS 100S] 100 strip     Sig: USE TO TEST AS DIRECTED FOUR TIMES DAILY.     Endocrinology: Diabetes - Testing Supplies Passed - 01/29/2022  3:58 PM      Passed - Valid encounter within last 12 months    Recent Outpatient Visits           10 months ago Mild intermittent asthma with exacerbation   [facility] Noemi Chapel A, NP   11 months ago Type 2 DM with CKD stage 3 and hypertension ([place])   [facility] Eulogio Bear, NP   1 year ago Type 2 diabetes mellitus with stage 2 chronic kidney disease, with long-term current use of insulin ([place])   [place] Eulogio Bear, NP   1 year ago Type 2 diabetes mellitus with stage 2 chronic kidney disease, with long-term current use of insulin ([place])   [facility] Eulogio Bear, NP   1 year ago Type 2 diabetes mellitus with stage 2 chronic kidney disease, with long-term current use of insulin ([place])   [facility] Eulogio Bear, NP       Future Appointments             In 1 month Yvonna Alanis, NP [facility]

## 2022-02-05 ENCOUNTER — Other Ambulatory Visit: Payer: Self-pay | Admitting: Nurse Practitioner

## 2022-02-05 DIAGNOSIS — Z794 Long term (current) use of insulin: Secondary | ICD-10-CM

## 2022-03-13 ENCOUNTER — Telehealth: Payer: Self-pay

## 2022-03-13 ENCOUNTER — Encounter: Payer: Medicaid Other | Admitting: Orthopedic Surgery

## 2022-03-13 NOTE — Telephone Encounter (Signed)
Late entry: 03/12/2022 Secure chat message received from Nelly Rout, Administrative assistant  This patient is away and her daughter said she needs her medication. Patient is stuck in [place] due to a fall and daughter will send her medication refills to her. Please refill.  I wasn't sure which daughter Ledon Snare I called Ana and left a message for her to call office so I could find out which medication was needed.

## 2022-03-14 NOTE — Progress Notes (Signed)
This encounter was created in error - please disregard.

## 2022-03-27 DEATH — deceased

## 2022-04-17 ENCOUNTER — Ambulatory Visit: Payer: Medicaid Other | Admitting: Orthopedic Surgery
